# Patient Record
Sex: Female | Born: 1955
Health system: Southern US, Community
[De-identification: ages and names within clinical notes are randomized; demographics above are authoritative.]

## PROBLEM LIST (undated history)

## (undated) DIAGNOSIS — G894 Chronic pain syndrome: Secondary | ICD-10-CM

## (undated) DIAGNOSIS — F329 Major depressive disorder, single episode, unspecified: Secondary | ICD-10-CM

## (undated) DIAGNOSIS — E079 Disorder of thyroid, unspecified: Secondary | ICD-10-CM

## (undated) DIAGNOSIS — K219 Gastro-esophageal reflux disease without esophagitis: Secondary | ICD-10-CM

## (undated) DIAGNOSIS — Z8719 Personal history of other diseases of the digestive system: Secondary | ICD-10-CM

## (undated) DIAGNOSIS — J449 Chronic obstructive pulmonary disease, unspecified: Secondary | ICD-10-CM

## (undated) DIAGNOSIS — K649 Unspecified hemorrhoids: Secondary | ICD-10-CM

## (undated) DIAGNOSIS — M797 Fibromyalgia: Secondary | ICD-10-CM

## (undated) DIAGNOSIS — E0590 Thyrotoxicosis, unspecified without thyrotoxic crisis or storm: Secondary | ICD-10-CM

## (undated) DIAGNOSIS — F419 Anxiety disorder, unspecified: Secondary | ICD-10-CM

## (undated) DIAGNOSIS — L4050 Arthropathic psoriasis, unspecified: Secondary | ICD-10-CM

## (undated) DIAGNOSIS — N63 Unspecified lump in unspecified breast: Secondary | ICD-10-CM

## (undated) DIAGNOSIS — I10 Essential (primary) hypertension: Secondary | ICD-10-CM

## (undated) DIAGNOSIS — E0500 Thyrotoxicosis with diffuse goiter without thyrotoxic crisis or storm: Secondary | ICD-10-CM

## (undated) DIAGNOSIS — J45909 Unspecified asthma, uncomplicated: Secondary | ICD-10-CM

## (undated) DIAGNOSIS — M5136 Other intervertebral disc degeneration, lumbar region: Secondary | ICD-10-CM

## (undated) DIAGNOSIS — M51369 Other intervertebral disc degeneration, lumbar region without mention of lumbar back pain or lower extremity pain: Secondary | ICD-10-CM

## (undated) DIAGNOSIS — R7303 Prediabetes: Secondary | ICD-10-CM

## (undated) DIAGNOSIS — E119 Type 2 diabetes mellitus without complications: Secondary | ICD-10-CM

## (undated) DIAGNOSIS — H409 Unspecified glaucoma: Secondary | ICD-10-CM

## (undated) DIAGNOSIS — M519 Unspecified thoracic, thoracolumbar and lumbosacral intervertebral disc disorder: Secondary | ICD-10-CM

## (undated) DIAGNOSIS — R519 Headache, unspecified: Secondary | ICD-10-CM

## (undated) DIAGNOSIS — F32A Depression, unspecified: Secondary | ICD-10-CM

## (undated) DIAGNOSIS — E669 Obesity, unspecified: Secondary | ICD-10-CM

## (undated) DIAGNOSIS — M5090 Cervical disc disorder, unspecified, unspecified cervical region: Secondary | ICD-10-CM

## (undated) DIAGNOSIS — E785 Hyperlipidemia, unspecified: Secondary | ICD-10-CM

## (undated) HISTORY — DX: Disorder of thyroid, unspecified: E07.9

## (undated) HISTORY — PX: COLONOSCOPY: SHX174

## (undated) HISTORY — DX: Hyperlipidemia, unspecified: E78.5

## (undated) HISTORY — PX: HAND SURGERY: SHX662

## (undated) HISTORY — DX: Other intervertebral disc degeneration, lumbar region: M51.36

## (undated) HISTORY — PX: TOTAL HIP ARTHROPLASTY: SHX124

## (undated) HISTORY — DX: Thyrotoxicosis with diffuse goiter without thyrotoxic crisis or storm: E05.00

## (undated) HISTORY — PX: SHOULDER SURGERY: SHX246

## (undated) HISTORY — PX: BACK SURGERY: SHX140

## (undated) HISTORY — DX: Essential (primary) hypertension: I10

## (undated) HISTORY — PX: OTHER SURGICAL HISTORY: SHX169

## (undated) HISTORY — DX: Other intervertebral disc degeneration, lumbar region without mention of lumbar back pain or lower extremity pain: M51.369

## (undated) HISTORY — PX: NECK SURGERY: SHX720

## (undated) HISTORY — PX: TUBAL LIGATION: SHX77

## (undated) HISTORY — PX: JOINT REPLACEMENT: SHX530

## (undated) HISTORY — DX: Fibromyalgia: M79.7

---
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
## 1999-07-11 ENCOUNTER — Ambulatory Visit (HOSPITAL_BASED_OUTPATIENT_CLINIC_OR_DEPARTMENT_OTHER): Admission: RE | Admit: 1999-07-11 | Discharge: 1999-07-11 | Payer: Self-pay | Admitting: Orthopedic Surgery

## 1999-11-02 ENCOUNTER — Ambulatory Visit (HOSPITAL_COMMUNITY): Admission: RE | Admit: 1999-11-02 | Discharge: 1999-11-02 | Payer: Self-pay | Admitting: Orthopedic Surgery

## 1999-11-02 ENCOUNTER — Encounter: Payer: Self-pay | Admitting: Orthopedic Surgery

## 1999-12-05 ENCOUNTER — Ambulatory Visit (HOSPITAL_BASED_OUTPATIENT_CLINIC_OR_DEPARTMENT_OTHER): Admission: RE | Admit: 1999-12-05 | Discharge: 1999-12-05 | Payer: Self-pay | Admitting: Orthopedic Surgery

## 2000-02-15 ENCOUNTER — Encounter (INDEPENDENT_AMBULATORY_CARE_PROVIDER_SITE_OTHER): Payer: Self-pay | Admitting: *Deleted

## 2000-02-15 ENCOUNTER — Ambulatory Visit (HOSPITAL_BASED_OUTPATIENT_CLINIC_OR_DEPARTMENT_OTHER): Admission: RE | Admit: 2000-02-15 | Discharge: 2000-02-15 | Payer: Self-pay | Admitting: Orthopedic Surgery

## 2000-05-12 ENCOUNTER — Encounter: Admission: RE | Admit: 2000-05-12 | Discharge: 2000-08-10 | Payer: Self-pay | Admitting: Anesthesiology

## 2000-08-21 ENCOUNTER — Encounter: Admission: RE | Admit: 2000-08-21 | Discharge: 2000-11-19 | Payer: Self-pay | Admitting: Anesthesiology

## 2001-06-19 ENCOUNTER — Other Ambulatory Visit: Admission: RE | Admit: 2001-06-19 | Discharge: 2001-06-19 | Payer: Self-pay | Admitting: Family Medicine

## 2004-05-01 ENCOUNTER — Other Ambulatory Visit: Payer: Self-pay

## 2004-08-26 HISTORY — PX: CARDIAC CATHETERIZATION: SHX172

## 2004-10-04 ENCOUNTER — Ambulatory Visit: Payer: Self-pay | Admitting: Anesthesiology

## 2004-10-12 ENCOUNTER — Ambulatory Visit: Payer: Self-pay | Admitting: Family Medicine

## 2004-11-01 ENCOUNTER — Ambulatory Visit: Payer: Self-pay | Admitting: Anesthesiology

## 2004-11-14 ENCOUNTER — Ambulatory Visit: Payer: Self-pay | Admitting: Unknown Physician Specialty

## 2004-12-13 ENCOUNTER — Ambulatory Visit: Payer: Self-pay | Admitting: Anesthesiology

## 2005-01-30 ENCOUNTER — Ambulatory Visit: Payer: Self-pay | Admitting: Anesthesiology

## 2005-05-21 ENCOUNTER — Ambulatory Visit: Payer: Self-pay | Admitting: Gastroenterology

## 2005-05-24 ENCOUNTER — Ambulatory Visit: Payer: Self-pay | Admitting: Neurology

## 2005-07-01 ENCOUNTER — Other Ambulatory Visit: Payer: Self-pay

## 2005-08-27 ENCOUNTER — Ambulatory Visit: Payer: Self-pay | Admitting: Gastroenterology

## 2005-10-10 ENCOUNTER — Ambulatory Visit: Payer: Self-pay | Admitting: Gastroenterology

## 2005-10-21 ENCOUNTER — Ambulatory Visit: Payer: Self-pay | Admitting: Unknown Physician Specialty

## 2005-11-05 ENCOUNTER — Encounter: Payer: Self-pay | Admitting: Family Medicine

## 2005-11-08 ENCOUNTER — Ambulatory Visit: Payer: Self-pay | Admitting: Family Medicine

## 2005-11-12 ENCOUNTER — Ambulatory Visit: Payer: Self-pay | Admitting: Gastroenterology

## 2005-11-21 ENCOUNTER — Emergency Department: Payer: Self-pay | Admitting: Emergency Medicine

## 2005-11-24 ENCOUNTER — Encounter: Payer: Self-pay | Admitting: Family Medicine

## 2005-12-19 ENCOUNTER — Emergency Department: Payer: Self-pay | Admitting: Emergency Medicine

## 2006-01-14 ENCOUNTER — Emergency Department: Payer: Self-pay | Admitting: Internal Medicine

## 2006-11-05 ENCOUNTER — Ambulatory Visit: Payer: Self-pay | Admitting: Anesthesiology

## 2006-11-27 ENCOUNTER — Ambulatory Visit: Payer: Self-pay | Admitting: Anesthesiology

## 2006-12-24 ENCOUNTER — Ambulatory Visit: Payer: Self-pay | Admitting: Gastroenterology

## 2007-01-22 ENCOUNTER — Ambulatory Visit: Payer: Self-pay | Admitting: Anesthesiology

## 2007-02-20 ENCOUNTER — Ambulatory Visit: Payer: Self-pay

## 2007-03-10 ENCOUNTER — Ambulatory Visit: Payer: Self-pay | Admitting: Anesthesiology

## 2007-03-26 ENCOUNTER — Ambulatory Visit: Payer: Self-pay | Admitting: Family Medicine

## 2007-04-16 ENCOUNTER — Ambulatory Visit: Payer: Self-pay | Admitting: General Practice

## 2007-06-03 ENCOUNTER — Ambulatory Visit: Payer: Self-pay | Admitting: Anesthesiology

## 2007-06-17 ENCOUNTER — Ambulatory Visit: Payer: Self-pay | Admitting: Pain Medicine

## 2007-07-29 ENCOUNTER — Ambulatory Visit: Payer: Self-pay | Admitting: Pain Medicine

## 2007-08-12 ENCOUNTER — Ambulatory Visit: Payer: Self-pay | Admitting: Pain Medicine

## 2007-09-01 ENCOUNTER — Ambulatory Visit: Payer: Self-pay | Admitting: Neurology

## 2007-09-01 ENCOUNTER — Ambulatory Visit: Payer: Self-pay | Admitting: Pain Medicine

## 2007-09-28 ENCOUNTER — Ambulatory Visit: Payer: Self-pay | Admitting: Pain Medicine

## 2007-10-15 ENCOUNTER — Ambulatory Visit: Payer: Self-pay | Admitting: Pain Medicine

## 2007-11-23 ENCOUNTER — Ambulatory Visit: Payer: Self-pay | Admitting: Physician Assistant

## 2007-12-22 ENCOUNTER — Ambulatory Visit: Payer: Self-pay | Admitting: Physician Assistant

## 2008-04-02 ENCOUNTER — Encounter: Admission: RE | Admit: 2008-04-02 | Discharge: 2008-04-02 | Payer: Self-pay | Admitting: Neurosurgery

## 2008-05-05 ENCOUNTER — Ambulatory Visit: Payer: Self-pay | Admitting: Family Medicine

## 2008-06-22 ENCOUNTER — Ambulatory Visit: Payer: Self-pay | Admitting: Pain Medicine

## 2008-06-29 ENCOUNTER — Ambulatory Visit: Payer: Self-pay | Admitting: Pain Medicine

## 2008-07-06 ENCOUNTER — Ambulatory Visit: Payer: Self-pay | Admitting: Pain Medicine

## 2008-12-02 ENCOUNTER — Ambulatory Visit (HOSPITAL_COMMUNITY): Admission: RE | Admit: 2008-12-02 | Discharge: 2008-12-02 | Payer: Self-pay | Admitting: Neurosurgery

## 2008-12-07 ENCOUNTER — Encounter: Payer: Self-pay | Admitting: Unknown Physician Specialty

## 2008-12-22 ENCOUNTER — Ambulatory Visit: Payer: Self-pay | Admitting: Gastroenterology

## 2008-12-24 ENCOUNTER — Encounter: Payer: Self-pay | Admitting: Unknown Physician Specialty

## 2009-01-24 ENCOUNTER — Encounter: Payer: Self-pay | Admitting: Unknown Physician Specialty

## 2009-06-30 ENCOUNTER — Ambulatory Visit: Payer: Self-pay | Admitting: Family Medicine

## 2009-09-07 ENCOUNTER — Ambulatory Visit: Payer: Self-pay

## 2009-10-11 ENCOUNTER — Inpatient Hospital Stay (HOSPITAL_COMMUNITY): Admission: RE | Admit: 2009-10-11 | Discharge: 2009-10-12 | Payer: Self-pay | Admitting: Neurosurgery

## 2010-03-13 ENCOUNTER — Ambulatory Visit: Payer: Self-pay | Admitting: Specialist

## 2010-03-21 ENCOUNTER — Ambulatory Visit: Payer: Self-pay | Admitting: Specialist

## 2010-05-17 ENCOUNTER — Ambulatory Visit: Payer: Self-pay | Admitting: Pain Medicine

## 2010-05-30 ENCOUNTER — Other Ambulatory Visit: Payer: Self-pay | Admitting: Specialist

## 2010-06-04 ENCOUNTER — Ambulatory Visit: Payer: Self-pay | Admitting: Pain Medicine

## 2010-06-07 ENCOUNTER — Ambulatory Visit: Payer: Self-pay | Admitting: Pain Medicine

## 2010-06-28 ENCOUNTER — Ambulatory Visit: Payer: Self-pay | Admitting: Pain Medicine

## 2010-08-08 ENCOUNTER — Ambulatory Visit: Payer: Self-pay | Admitting: Pain Medicine

## 2010-09-05 ENCOUNTER — Ambulatory Visit: Payer: Self-pay | Admitting: Pain Medicine

## 2010-09-16 ENCOUNTER — Encounter: Payer: Self-pay | Admitting: Neurosurgery

## 2010-09-27 ENCOUNTER — Ambulatory Visit: Payer: Self-pay | Admitting: Unknown Physician Specialty

## 2010-10-18 ENCOUNTER — Ambulatory Visit: Payer: Self-pay | Admitting: Unknown Physician Specialty

## 2010-10-30 ENCOUNTER — Inpatient Hospital Stay: Payer: Self-pay | Admitting: Unknown Physician Specialty

## 2010-11-14 LAB — BASIC METABOLIC PANEL
BUN: 8 mg/dL (ref 6–23)
CO2: 29 mEq/L (ref 19–32)
Calcium: 9.9 mg/dL (ref 8.4–10.5)
Chloride: 105 mEq/L (ref 96–112)
Creatinine, Ser: 0.73 mg/dL (ref 0.4–1.2)
GFR calc Af Amer: >60 mL/min (ref >60)
GFR calc non Af Amer: >60 mL/min (ref >60)
Glucose, Bld: 77 mg/dL (ref 70–99)
Potassium: 3.7 mEq/L (ref 3.5–5.1)
Sodium: 138 mEq/L (ref 135–145)

## 2010-11-14 LAB — CBC
HCT: 39.7 % (ref 36.0–46.0)
Hemoglobin: 13.5 g/dL (ref 12.0–15.0)
MCHC: 34 g/dL (ref 30.0–36.0)
MCV: 84.8 fL (ref 78.0–100.0)
Platelets: 193 10*3/uL (ref 150–400)
RBC: 4.68 MIL/uL (ref 3.87–5.11)
RDW: 13.5 % (ref 11.5–15.5)
WBC: 4.4 10*3/uL (ref 4.0–10.5)

## 2011-01-11 NOTE — Op Note (Signed)
[facility]  Patient:    Christine Higgins, Christine Higgins                         MRN: [id number] Proc. Date: 09/09/00 Adm. Date:  [id number] Attending:  Thyra Breed CC:         Harvie Junior, M.D.                           Operative Report  PROCEDURE:  Bretylium/lidocaine Bier block of the right upper extremity.  DIAGNOSIS:  Complex regional pain syndrome of the right upper extremity.  ANESTHESIOLOGIST:  Thyra Breed, M.D.  INTERVAL HISTORY:  The patient was initially seen by [place] back in September 2001 at which time she underwent a right stellate ganglion block and noticed only minimal improvement which lasted about one hour.  She is back for a trial of a right upper extremity Bier block today.  She complains of decreased strength and pain in her right upper extremity.  She is unable to use her right upper extremity.  She states that the pain is fairly tense and rated it as a level of 8/10.  PHYSICAL EXAMINATION:  Blood pressure 131/76, heart rate 87, respirations 18, O2 saturation is 99%.  She exhibits no allodynia, no appreciable temperature differences and no increased hair growth.  There is no evidence of a stroke. She does have pain on resisted movements of her right upper extremity.  Pulses were symmetric.  Sensation is intact to pinprick and vibratory sense.  DESCRIPTION OF PROCEDURE:  After informed consent was obtained, the patient was placed in the semirecumbent position and monitored.  An IV was established in her left upper extremity and a second IV was established in her right upper extremity.  A cuff was applied to the right upper extremity to check for patency which was intact.  Her right upper extremity was elevated and wrapped with an Esmarch bandage.  The proximal cuff was inflated and the bandage removed.  Her right upper extremity was infused with 45 ml of 0.5% lidocaine with 300 mg of bretylium.  The patient was sedated lightly with Versed  and fentanyl.  Eight minutes after inflating the proximal cuff, the distal cuff was inflated and the proximal cuff deflated.  The total cuff time was at 40 minutes and at the end of 40 minutes, the cuff was released.  The patient noted decreased pain with numbness of her hand.  Postprocedure condition:  Stable.  DISCHARGE INSTRUCTIONS: 1. Resume previous diet. 2. Limitation of activities per instruction sheet. 3. Continue current medications. 4. The patient plans to follow up with Dr. Luiz Blare. DD:  09/09/00 TD:  09/09/00 Job: [id number]

## 2011-01-11 NOTE — Op Note (Signed)
[place]. [facility]  Patient:    Christine Higgins, GUIFFRE                         MRN: [id number] Proc. Date: 02/15/00 Adm. Date:  [id number] Disc. Date: [id number] Attending:  Milly Jakob CC:         Harvie Junior, M.D.                           Operative Report  PREOPERATIVE DIAGNOSIS:  Carpal tunnel syndrome with flexor tenosynovitis.  POSTOPERATIVE DIAGNOSIS:  Carpal tunnel syndrome with flexor tenosynovitis.  OPERATION: 1. Carpal tunnel release. 2. Open flexor tenosynovectomy.  SURGEON:  Harvie Junior, M.D.  ASSISTANT:  Dr. Willa Rough.  ANESTHESIA:  Forearm based IV regional converted to a LMA general.  BRIEF HISTORY:  This is a 55 year old female with a longstanding history of having bilateral carpal tunnel syndrome.  We ultimately did a carpal tunnel release on her and she did great for a month.  She then started having worsening symptoms.  MRI and EMG showed that she had worsening median nerve function, and it was felt by MRI to be related to a flexor tendon synovitis. We did a second exposure and did a flexor tenosynovectomy and she did wonderfully.  She had a tremendous amount of flexor tenosynovitis.  She was brought to the operating room for a left sided carpal tunnel release. At this point it was felt that it was important to do a flexor tenosynovectomy given her problems with the opposite side and this was our plan preoperatively.  PROCEDURE:  The patient was brought to the operating room and after adequate general anesthesia was obtained with the forearm based IV regional the patient was placed supine on the operating room table.  A curvilinear incision was made with the angled portion to cross the wrist crease and once this was done the exposure was made and the median nerve was identified.  The whole carpal tunnel was released in a standard fashion and the attention was then turned towards the flexor tendon.  There was a tremendous amount  of tenosynovium was identified in the carpal tunnel, and the median nerve was carefully identified and retracted out of the way, and a flexor tenosynovectomy was performed on each tendon in the carpal canal.  Once this was finished the wound was irrigated and suctioned dry.  A check was made to identify the motor branch of the median nerve to make sure it was not compressed, and then one final check was made of each of the flexor tendons individually and a excellent tenosynovectomy had been performed.  Following this the wound was copiously irrigated and suctioned dry.  The skin was then closed with 4-0 nylon interrupted horizontal mattress sutures.  A sterile and compressive dressing was applied as well as with volar plaster and the patient was taken to the recovery room and she was noted to be satisfactory condition.  ESTIMATED BLOOD LOSS:  None. DD:  02/15/00 TD:  02/18/00 Job: [id number]

## 2011-01-11 NOTE — Op Note (Signed)
[place]  Patient:    Christine Higgins, Christine Higgins                         MRN: [id number] Proc. Date: 12/05/99 Adm. Date:  [id number] Attending:  Milly Jakob CC:         Harvie Junior, M.D.                           Operative Report  PREOPERATIVE DIAGNOSIS:  Medial nerve compression syndrome, status post carpal tunnel release with extensive flexor synovitis.  POSTOPERATIVE DIAGNOSIS:  Medial nerve compression syndrome, status post carpal  tunnel release with extensive flexor synovitis.  PRINCIPAL PROCEDURES: 1. Carpal tunnel release with extensive decompression of median nerve proximally in    the forearm. 2. Volar compartment fasciotomy. 3. Debridement of flexor tenosynovitis.  SURGEON:  Harvie Junior, M.D. and Artist Pais. Mina Marble, M.D.  ASSISTANTWilla Rough, P.A.  ANESTHESIA:  General.  BRIEF HISTORY:  She is a 55 year old female with a long history of having had carpal tunnel syndrome bilaterally.  She was initially evaluated and treated conservatively and failed.  She was ultimately taken to the operating room for carpal tunnel release.  A standard carpal tunnel release was performed and she initially did well for about 2-3 weeks and then she started getting worse symptoms. This progressed to a height at about 3 months, where her symptoms were similar, if not worse to prior to surgery.  At this point, an EMG was obtained, which showed that she had significantly worse parameters than prior to surgery, although it as done by a different electromyographer is a little bit unclear.  Given the severity of the new numbers, a second EMG was performed four weeks later and this had not changed, showing that there was a static compression on the nerve.  An MRI was obtained to make sure the nerve was okay and it in fact appeared to be.  The time of MRI was noted to be extensive flexor tenosynovitis and after a long discussion with the patient, we  ultimately elected to go back and evaluate the median nerve as well as, do a flexor synovectomy.  She was brought to the operating room for this procedure.  PROCEDURE:  Patient was taken to the operating room and after adequate anesthesia was obtained with general anesthetic, the patient placed supine on the operating table.  The right arm was then prepped and draped in usual sterile fashion. Following Esmarch exsanguination of the extremity, blood pressure tourniquet was inflated 250 mmHg.  Following this, the carpal tunnel incision was opened, as well as, an ulnar rounded extension and then a radial extension proximally to allow access to the volar forearm.  Following this, subcutaneous tissues were taken down to the level of the palmaris tendon, which was identified and the fascia was opened.  The median nerve was identified just radial to the palmaris tendon and  this was traced distally.  The nerve was then retracted with vessel loops and attention was then turned to the flexor tendons within the carpal tunnel. Extensive flexor tenosynovectomy was performed and there was extensive amounts f flexor tenosynovitis.  Each tendon was taken singly and stripped of its tenosynovium, which was extensive and looked diseased.  The material was sent to the laboratory for evaluation.  Following additional synovectomy, a final check was made to make sure there was no additional tenosynovium and  the tenosynovium had  been cleared from the floor of the canal, as well as, off of each of the tendons individually and this was done distally and to the area of the palm.  Following  this, the median nerve was identified again and traced proximally to make sure there was no compression on it.  A volar compartment fasciotomy was performed to ensure that there was no compression on the median nerve.  The wound at this point was copiously irrigated, tourniquet was let down.  No significant  bleeding was encountered and those bleeders that were, were controlled with electrocautery.  Following this, the wound was one final time copiously irrigated and suctioned ry. The skin was then closed with a combination of 4-0 nylon interrupted sutures. wo Penrose drains were placed, one proximally and one distally to allow for drainage of any blood from the flexor tenosynovectomy.  At this point, a sterile compressive dressing was applied, as well as, a volar plaster and the patient was taken to recovery room, where she was noted to be in satisfactory condition.  Estimated blood loss for the procedure was less than 50 cc. DD:  12/05/99 TD:  12/05/99 Job: [id number]

## 2011-01-11 NOTE — Op Note (Signed)
[place]. [facility]  Patient:    Christine Higgins                          MRN: [id number] Proc. Date: 07/11/99 Adm. Date:  [id number] Attending:  Milly Jakob CC:         Harvie Junior, M.D.                           Operative Report  PREOPERATIVE DIAGNOSIS:  Carpal tunnel syndrome, bilateral.  POSTOPERATIVE DIAGNOSIS:  Carpal tunnel syndrome, bilateral.  PRINCIPAL PROCEDURE:  Right carpal tunnel release.  SURGEON:  Harvie Junior, M.D.  ASSISTANT:  Kerby Less, P.A.  ANESTHESIA:  Forearm-based IV regional.  BRIEF HISTORY:  This is a 55 year old female with a long history of having severe bilateral carpal tunnel syndrome.  Because of this, she had failed conservative  care and she is ultimately taken to the operating room for carpal tunnel release.  DESCRIPTION OF PROCEDURE:  Patient was taken to the operating room and after adequate anesthesia was obtained with ______ , the patient was placed supine on  the operating table.  The right arm was then prepped and draped in the usual sterile fashion and following this, anesthesia had already been induced with a forearm-based IV region and then she was prepped, as stated above, and at this point, a curvilinear incision was made just ulnar to the median/midline wrist crease.  Subcutaneous tissue was taken down to the level of the palmar fascia and flaps were raised medial and lateral.  The volar carpal ligament was identified and sharply incised, care being taken to just poke through the ligament.  At that point, a Therapist, nutritional was used to go underneath the ligament both proximally nd distally.  Once that had been elevated both proximally and distally, the scissors were used to divide the volar carpal ligament both proximally and distally and t that point, a finger could be placed in the wound both proximally and distally,  ensuring that the nerve was in fact freed up.  Care was taken to  look at the nerve at this point.  It was severely narrowed and compressed right at the area of the volar carpal ligament.  There was some significant epineurial thickening.  This was moderately dissected out, although care was taken not to really injure the nerve at all, and the nerve was just freed up both medially and laterally.  The wound was copiously irrigated and suctioned dry.  The skin was closed with a combination f interrupted and running 4-0 nylon sutures.  A sterile compressive dressing was applied and a volar plaster and the patient was taken to the recovery room where she was noted to be in satisfactory condition.  Estimated blood loss for the procedure was none. DD:  07/11/99 TD:  07/12/99 Job: [id number]

## 2011-01-11 NOTE — Procedures (Signed)
[facility]  Patient:    Christine Higgins, Christine Higgins                         MRN: [id number] Proc. Date: 05/12/00 Attending:  Thyra Breed, M.D. CC:         Harvie Junior, M.D.   Procedure Report  DATE OF BIRTH:  02/20/1956  PROCEDURE:  Right stellate ganglion block.  DIAGNOSIS:  Bilateral hand pain.  HISTORY OF PRESENT ILLNESS:  Christine Higgins is a very pleasant 55 year old who is sent to [place] by Dr. Jodi Geralds for a diagnostic stellate ganglion block. It was originally scheduled for bilateral but she only received a right stellate ganglion block.  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 By May of 2000, this had become so severe that her right hand was going numb and at one point, she was unable to use it without a lot of discomfort. She saw her company nurse who placed her in wrist splints in May of 2000. By August of 2000, her right upper extremity went totally numb and was weak. She was having symptoms in her left but only mild compared to the severe symptoms and right upper extremity which she characterized as a burning type of discomfort with dysesthesia. She was seen in the emergency room at [facility] and diagnosed with carpal tunnel syndrome and seen by Dr. Hyacinth Meeker who injected her shoulder with corticosteroids which apparently she had a reaction to where she developed flu-like symptoms. As she used her right upper extremity less, she became more symptomatic in the left upper extremity. She was sent to see Dr. Luiz Blare for evaluation and a diagnosis of carpal tunnel syndrome was made. In November of 2000, she underwent surgery for her right carpal tunnel syndrome and did well for about 3 weeks and then developed an acute discomfort distinct from her previous discomfort. She underwent  an MRI which showed a lot of scar formation and underwent revision of the scar in April of 2001. She did well for a few weeks. As she required more and more of her left upper extremity, she developed increasing symptoms there and by May/June 2001, it had progressed to the point where by June of 2001 she required a left carpal tunnel release. For 4-5 weeks, she did well. She was followed up with physical therapy but developed increasing discomfort.  She has been treated with multiple medical interventions including nortriptyline, neurontin, vioxx and Darvocet with minimal improvement. She is currently on celebrex which is not reducing any of her discomfort.  She describes her pain as an aching, burning, throbbing type of pain identical to the pain at the outset but more intense. It is increased by activity and improved by the application of ice. She has been evaluated with nerve conduction studies on 2 occasions, the last one being about 3 months ago but has had None done recently. She notes that she has developed headaches associated with the right upper shoulder discomfort. At night, she has a lot of burning dysesthesia in her shoulders.  CURRENT MEDICATIONS:  Celebrex.  ALLERGIES:  Cortisone.  FAMILY HISTORY:  Positive for hypertension, prostate problems, congestive heart failure and lung cancer.  SOCIAL HISTORY:  The patient is a nonsmoker, nondrinker. She was working as an Physicist, medical person.  PAST  SURGICAL HISTORY:  Significant for cesarean section in 1991 and wrist surgeries as mentioned in HPI.  ACTIVE MEDICAL PROBLEMS:  Seasonal allergies and atopic dermatitis.  REVIEW OF SYSTEMS:  GENERAL:  Significant for sweating associated with exacerbations in her pain.  HEAD:  Significant for headaches as mentioned above. EYES:  Negative.  NOSE: Negative.  EARS:  Negative.  PULMONARY: Negative. CARDIOVASCULAR:  Negative. GI:  Negative. GU:   Negative. MUSCULOSKELETAL/NEUROLOGIC:  See HPI. HEMATOLOGIC:  Negative. SKIN: Significant for atopic dermatitis.  ENDOCRINE:  Negative. PSYCHIATRIC: Significant for depression as a result of her arm discomfort. ALLERGY: Significant for seasonal allergies.  PHYSICAL EXAMINATION:  VITAL SIGNS:  Blood pressure 120/70, heart rate 90, respiratory rate 16, O2 saturations 99%, temperature 97.7 and pain level is 8/10.  GENERAL:  This is a very pleasant female in no acute distress.  HEENT:  Head was normocephalic, atraumatic. Eyes, extraocular movements intact with conjunctivae and sclerae clear. Nose patent nares without discharge. Oropharynx was free of lesions.  NECK:  Supple without lymphadenopathy. Carotids were 2+ and symmetric without bruits. She had a very thick muscular neck.  LUNGS:  Clear.  HEART:  Regular rate and rhythm.  BREASTS/ABDOMINAL/PELVIC/RECTAL:  Not performed  EXTREMITIES:  The patient demonstrated well healed surgical scars over her wrists with negative Phalen and Tinel sign. She noted shoulder discomfort when she was doing the Phalens test in the effort to hold her shoulders up. Radial pulses and dorsalis pedis pulse were 2+ and symmetric.  NEUROLOGIC:  The patient was oriented x 4. Cranial nerves II-XII are grossly intact. Deep tendon reflexes were symmetric in the upper and lower extremity with downgoing toes. Motor was significant for decreased hand grips in the hands but this may have been decreased effort due to the discomfort in her hands. Sensory, the patient demonstrated intact pinprick and vibratory sense with no evidence of hyperesthesia or allodynia. Coordination was grossly intact.  IMPRESSION: 1. Bilateral hand pain status post carpal tunnel release with possible    element of sympathetic mediated pain but suspected ongoing discomfort    from her median nerve entrapment which is just not resolved completely. 2. Seasonal allergies. 3. Atopic  dermatitis. 4. Depression.   DISPOSITION:  I discussed with the patient the possibility that she may have a positive response to a stellate ganglion block and that as a diagnostic test, it might help to delineate some of her discomfort better. I advised her that this may not be her ultimate treatment mode and that she may have to have further interventions either with IV infusion of the lidocaine or repeat blocks versus a Bier block. I discussed in detail a stellate ganglion block and the potential risks and complications as well as the benefits. She wishes to proceed.  DESCRIPTION OF PROCEDURE:  After informed consent was obtained, the patient was placed in the supine position and monitored. An IV was established in her left upper extremity. A towel roll was placed between her shoulder blades. I identified the C6 tubercle on the right side and marked the skin overlying this. The skin was prepped with Betadine x 3. A skin wheal was raised with a 27 gauge needle using 1% lidocaine. A 22 gauge needle was introduced down to [place] tubercle with negative aspiration. A test dose with a 0.5 cc of 1% lidocaine was negative for intravenous and spinal injection. I injected 9 ml of 1% lidocaine incrementally with negative aspirates between each 1 ml increment with the assistance of my nurse. The  needle was removed intact.  Fifteen minutes later, the patient noted at least a 50% reduction in her discomfort. She developed a prominent right Horner syndrome and vasodilatation of the right hand. This suggested she has a partially sympathetic mediated pain in her right upper extremity and this does not exclude other causes.  DISPOSITION: 1. I advised the patient that we could bring her back and repeat the test    to see whether we could get a more lasting response, but I wish to see    how she does over the next 2-3 days. I will be more than happy to see her    again at Dr. Luiz Blare request. 2.  In the meantime, it might be helpful to repeat her nerve conduction studies    to see whether there has been any overall improvement. 3. I will see the patient back in follow-up at Dr. Luiz Blare request. Other    options may include IV infusion of lidocaine with the thought of placing    her on mexilitene versus a bertillim Bier block.  DD:  05/12/00 TD:  05/13/00 Job: [id number]

## 2011-04-12 ENCOUNTER — Ambulatory Visit: Payer: Self-pay | Admitting: Family Medicine

## 2011-11-04 ENCOUNTER — Ambulatory Visit: Payer: Self-pay | Admitting: Orthopedic Surgery

## 2012-08-25 ENCOUNTER — Ambulatory Visit: Payer: Self-pay | Admitting: Physical Medicine and Rehabilitation

## 2012-11-18 ENCOUNTER — Ambulatory Visit: Payer: Self-pay | Admitting: Internal Medicine

## 2013-07-13 ENCOUNTER — Ambulatory Visit: Payer: Self-pay | Admitting: Gastroenterology

## 2013-09-17 ENCOUNTER — Ambulatory Visit: Payer: Self-pay | Admitting: Gastroenterology

## 2013-09-20 LAB — PATHOLOGY REPORT

## 2013-12-06 ENCOUNTER — Ambulatory Visit: Payer: Self-pay | Admitting: Gastroenterology

## 2014-02-03 DIAGNOSIS — G43909 Migraine, unspecified, not intractable, without status migrainosus: Secondary | ICD-10-CM | POA: Insufficient documentation

## 2014-02-03 DIAGNOSIS — K219 Gastro-esophageal reflux disease without esophagitis: Secondary | ICD-10-CM | POA: Insufficient documentation

## 2014-02-03 DIAGNOSIS — M810 Age-related osteoporosis without current pathological fracture: Secondary | ICD-10-CM | POA: Insufficient documentation

## 2014-02-03 DIAGNOSIS — E785 Hyperlipidemia, unspecified: Secondary | ICD-10-CM | POA: Insufficient documentation

## 2014-03-14 DIAGNOSIS — K649 Unspecified hemorrhoids: Secondary | ICD-10-CM | POA: Insufficient documentation

## 2014-04-19 DIAGNOSIS — E119 Type 2 diabetes mellitus without complications: Secondary | ICD-10-CM | POA: Insufficient documentation

## 2014-07-29 DIAGNOSIS — I700 Atherosclerosis of aorta: Secondary | ICD-10-CM | POA: Insufficient documentation

## 2014-08-01 ENCOUNTER — Ambulatory Visit: Payer: Self-pay

## 2014-08-09 DIAGNOSIS — Z6835 Body mass index (BMI) 35.0-35.9, adult: Secondary | ICD-10-CM

## 2014-08-10 ENCOUNTER — Ambulatory Visit: Payer: Self-pay | Admitting: General Practice

## 2014-08-10 LAB — URINALYSIS, COMPLETE
Bacteria: NONE SEEN
Bilirubin,UR: NEGATIVE
Blood: NEGATIVE
Glucose,UR: NEGATIVE mg/dL (ref 0–75)
Ketone: NEGATIVE
Leukocyte Esterase: NEGATIVE
Nitrite: NEGATIVE
Ph: 6 (ref 4.5–8.0)
Protein: NEGATIVE
RBC,UR: NONE SEEN /HPF (ref 0–5)
Specific Gravity: 1.004 (ref 1.003–1.030)
Squamous Epithelial: <1
WBC UR: <1 /HPF (ref 0–5)

## 2014-08-10 LAB — APTT: Activated PTT: 23.8 secs (ref 23.6–35.9)

## 2014-08-10 LAB — MRSA PCR SCREENING

## 2014-08-10 LAB — SEDIMENTATION RATE: Erythrocyte Sed Rate: 13 mm/hr (ref 0–30)

## 2014-08-10 LAB — BASIC METABOLIC PANEL
Anion Gap: 8 (ref 7–16)
BUN: 8 mg/dL (ref 7–18)
Calcium, Total: 9.6 mg/dL (ref 8.5–10.1)
Chloride: 98 mmol/L (ref 98–107)
Co2: 30 mmol/L (ref 21–32)
Creatinine: 0.75 mg/dL (ref 0.60–1.30)
EGFR (African American): >60
EGFR (Non-African Amer.): >60
Glucose: 109 mg/dL — ABNORMAL HIGH (ref 65–99)
Osmolality: 271 (ref 275–301)
Potassium: 3.4 mmol/L — ABNORMAL LOW (ref 3.5–5.1)
Sodium: 136 mmol/L (ref 136–145)

## 2014-08-10 LAB — CBC
HCT: 41.3 % (ref 35.0–47.0)
HGB: 13.2 g/dL (ref 12.0–16.0)
MCH: 26.9 pg (ref 26.0–34.0)
MCHC: 32.1 g/dL (ref 32.0–36.0)
MCV: 84 fL (ref 80–100)
Platelet: 245 10*3/uL (ref 150–440)
RBC: 4.92 10*6/uL (ref 3.80–5.20)
RDW: 13.8 % (ref 11.5–14.5)
WBC: 5.6 10*3/uL (ref 3.6–11.0)

## 2014-08-10 LAB — PROTIME-INR
INR: 0.9
Prothrombin Time: 11.9 secs (ref 11.5–14.7)

## 2014-08-11 LAB — URINE CULTURE

## 2014-08-24 ENCOUNTER — Inpatient Hospital Stay: Payer: Self-pay | Admitting: General Practice

## 2014-08-25 LAB — BASIC METABOLIC PANEL
Anion Gap: 5 — ABNORMAL LOW (ref 7–16)
BUN: 4 mg/dL — ABNORMAL LOW (ref 7–18)
Calcium, Total: 8.3 mg/dL — ABNORMAL LOW (ref 8.5–10.1)
Chloride: 99 mmol/L (ref 98–107)
Co2: 33 mmol/L — ABNORMAL HIGH (ref 21–32)
Creatinine: 0.81 mg/dL (ref 0.60–1.30)
EGFR (African American): >60
EGFR (Non-African Amer.): >60
Glucose: 159 mg/dL — ABNORMAL HIGH (ref 65–99)
Osmolality: 274 (ref 275–301)
Potassium: 3 mmol/L — ABNORMAL LOW (ref 3.5–5.1)
Sodium: 137 mmol/L (ref 136–145)

## 2014-08-25 LAB — HEMOGLOBIN: HGB: 11 g/dL — ABNORMAL LOW (ref 12.0–16.0)

## 2014-08-25 LAB — PLATELET COUNT: Platelet: 164 10*3/uL (ref 150–440)

## 2014-08-26 DIAGNOSIS — E876 Hypokalemia: Secondary | ICD-10-CM | POA: Diagnosis not present

## 2014-08-26 DIAGNOSIS — R000 Tachycardia, unspecified: Secondary | ICD-10-CM | POA: Diagnosis not present

## 2014-08-26 DIAGNOSIS — I10 Essential (primary) hypertension: Secondary | ICD-10-CM | POA: Diagnosis not present

## 2014-08-26 DIAGNOSIS — Z96643 Presence of artificial hip joint, bilateral: Secondary | ICD-10-CM | POA: Diagnosis not present

## 2014-08-26 DIAGNOSIS — M25561 Pain in right knee: Secondary | ICD-10-CM | POA: Diagnosis not present

## 2014-08-26 LAB — BASIC METABOLIC PANEL
Anion Gap: 6 — ABNORMAL LOW (ref 7–16)
BUN: 5 mg/dL — ABNORMAL LOW (ref 7–18)
Calcium, Total: 8.6 mg/dL (ref 8.5–10.1)
Chloride: 95 mmol/L — ABNORMAL LOW (ref 98–107)
Co2: 31 mmol/L (ref 21–32)
Creatinine: 0.74 mg/dL (ref 0.60–1.30)
EGFR (African American): >60
EGFR (Non-African Amer.): >60
Glucose: 155 mg/dL — ABNORMAL HIGH (ref 65–99)
Osmolality: 265 (ref 275–301)
Potassium: 2.8 mmol/L — ABNORMAL LOW (ref 3.5–5.1)
Sodium: 132 mmol/L — ABNORMAL LOW (ref 136–145)

## 2014-08-26 LAB — PLATELET COUNT: Platelet: 148 10*3/uL — ABNORMAL LOW (ref 150–440)

## 2014-08-26 LAB — HEMOGLOBIN: HGB: 10.2 g/dL — ABNORMAL LOW (ref 12.0–16.0)

## 2014-08-26 LAB — MAGNESIUM: Magnesium: 1.7 mg/dL — ABNORMAL LOW

## 2014-08-27 DIAGNOSIS — I10 Essential (primary) hypertension: Secondary | ICD-10-CM | POA: Diagnosis not present

## 2014-08-27 DIAGNOSIS — R000 Tachycardia, unspecified: Secondary | ICD-10-CM | POA: Diagnosis not present

## 2014-08-27 DIAGNOSIS — E875 Hyperkalemia: Secondary | ICD-10-CM | POA: Diagnosis not present

## 2014-08-27 LAB — MAGNESIUM: Magnesium: 1.9 mg/dL

## 2014-08-27 LAB — POTASSIUM: Potassium: 3.2 mmol/L — ABNORMAL LOW (ref 3.5–5.1)

## 2014-08-28 DIAGNOSIS — E875 Hyperkalemia: Secondary | ICD-10-CM | POA: Diagnosis not present

## 2014-08-28 DIAGNOSIS — I10 Essential (primary) hypertension: Secondary | ICD-10-CM | POA: Diagnosis not present

## 2014-08-28 DIAGNOSIS — R000 Tachycardia, unspecified: Secondary | ICD-10-CM | POA: Diagnosis not present

## 2014-08-28 LAB — POTASSIUM: Potassium: 3.8 mmol/L (ref 3.5–5.1)

## 2014-08-29 DIAGNOSIS — G5600 Carpal tunnel syndrome, unspecified upper limb: Secondary | ICD-10-CM | POA: Diagnosis not present

## 2014-08-29 DIAGNOSIS — M13851 Other specified arthritis, right hip: Secondary | ICD-10-CM | POA: Diagnosis not present

## 2014-08-29 DIAGNOSIS — M4800 Spinal stenosis, site unspecified: Secondary | ICD-10-CM | POA: Diagnosis not present

## 2014-08-29 DIAGNOSIS — M6080 Other myositis, unspecified site: Secondary | ICD-10-CM | POA: Diagnosis not present

## 2014-08-30 DIAGNOSIS — G894 Chronic pain syndrome: Secondary | ICD-10-CM | POA: Diagnosis not present

## 2014-08-30 DIAGNOSIS — E669 Obesity, unspecified: Secondary | ICD-10-CM | POA: Diagnosis not present

## 2014-08-30 DIAGNOSIS — Z96641 Presence of right artificial hip joint: Secondary | ICD-10-CM | POA: Diagnosis not present

## 2014-08-30 DIAGNOSIS — E785 Hyperlipidemia, unspecified: Secondary | ICD-10-CM | POA: Diagnosis not present

## 2014-08-30 DIAGNOSIS — G5600 Carpal tunnel syndrome, unspecified upper limb: Secondary | ICD-10-CM | POA: Diagnosis not present

## 2014-08-30 DIAGNOSIS — M4800 Spinal stenosis, site unspecified: Secondary | ICD-10-CM | POA: Diagnosis not present

## 2014-08-30 DIAGNOSIS — M13851 Other specified arthritis, right hip: Secondary | ICD-10-CM | POA: Diagnosis not present

## 2014-08-30 DIAGNOSIS — M6080 Other myositis, unspecified site: Secondary | ICD-10-CM | POA: Diagnosis not present

## 2014-08-30 DIAGNOSIS — I10 Essential (primary) hypertension: Secondary | ICD-10-CM | POA: Diagnosis not present

## 2014-08-30 DIAGNOSIS — M797 Fibromyalgia: Secondary | ICD-10-CM | POA: Diagnosis not present

## 2014-08-30 DIAGNOSIS — Z471 Aftercare following joint replacement surgery: Secondary | ICD-10-CM | POA: Diagnosis not present

## 2014-08-30 DIAGNOSIS — K219 Gastro-esophageal reflux disease without esophagitis: Secondary | ICD-10-CM | POA: Diagnosis not present

## 2014-08-31 DIAGNOSIS — G5600 Carpal tunnel syndrome, unspecified upper limb: Secondary | ICD-10-CM | POA: Diagnosis not present

## 2014-08-31 DIAGNOSIS — M13851 Other specified arthritis, right hip: Secondary | ICD-10-CM | POA: Diagnosis not present

## 2014-08-31 DIAGNOSIS — M4800 Spinal stenosis, site unspecified: Secondary | ICD-10-CM | POA: Diagnosis not present

## 2014-08-31 DIAGNOSIS — M6080 Other myositis, unspecified site: Secondary | ICD-10-CM | POA: Diagnosis not present

## 2014-09-01 DIAGNOSIS — E669 Obesity, unspecified: Secondary | ICD-10-CM | POA: Diagnosis not present

## 2014-09-01 DIAGNOSIS — K219 Gastro-esophageal reflux disease without esophagitis: Secondary | ICD-10-CM | POA: Diagnosis not present

## 2014-09-01 DIAGNOSIS — G894 Chronic pain syndrome: Secondary | ICD-10-CM | POA: Diagnosis not present

## 2014-09-01 DIAGNOSIS — E785 Hyperlipidemia, unspecified: Secondary | ICD-10-CM | POA: Diagnosis not present

## 2014-09-01 DIAGNOSIS — G5600 Carpal tunnel syndrome, unspecified upper limb: Secondary | ICD-10-CM | POA: Diagnosis not present

## 2014-09-01 DIAGNOSIS — M797 Fibromyalgia: Secondary | ICD-10-CM | POA: Diagnosis not present

## 2014-09-01 DIAGNOSIS — Z96641 Presence of right artificial hip joint: Secondary | ICD-10-CM | POA: Diagnosis not present

## 2014-09-01 DIAGNOSIS — M4800 Spinal stenosis, site unspecified: Secondary | ICD-10-CM | POA: Diagnosis not present

## 2014-09-01 DIAGNOSIS — M6080 Other myositis, unspecified site: Secondary | ICD-10-CM | POA: Diagnosis not present

## 2014-09-01 DIAGNOSIS — Z471 Aftercare following joint replacement surgery: Secondary | ICD-10-CM | POA: Diagnosis not present

## 2014-09-01 DIAGNOSIS — I10 Essential (primary) hypertension: Secondary | ICD-10-CM | POA: Diagnosis not present

## 2014-09-01 DIAGNOSIS — M13851 Other specified arthritis, right hip: Secondary | ICD-10-CM | POA: Diagnosis not present

## 2014-09-02 DIAGNOSIS — K219 Gastro-esophageal reflux disease without esophagitis: Secondary | ICD-10-CM | POA: Diagnosis not present

## 2014-09-02 DIAGNOSIS — I10 Essential (primary) hypertension: Secondary | ICD-10-CM | POA: Diagnosis not present

## 2014-09-02 DIAGNOSIS — Z471 Aftercare following joint replacement surgery: Secondary | ICD-10-CM | POA: Diagnosis not present

## 2014-09-02 DIAGNOSIS — E669 Obesity, unspecified: Secondary | ICD-10-CM | POA: Diagnosis not present

## 2014-09-02 DIAGNOSIS — M797 Fibromyalgia: Secondary | ICD-10-CM | POA: Diagnosis not present

## 2014-09-02 DIAGNOSIS — G894 Chronic pain syndrome: Secondary | ICD-10-CM | POA: Diagnosis not present

## 2014-09-02 DIAGNOSIS — M13851 Other specified arthritis, right hip: Secondary | ICD-10-CM | POA: Diagnosis not present

## 2014-09-02 DIAGNOSIS — G5600 Carpal tunnel syndrome, unspecified upper limb: Secondary | ICD-10-CM | POA: Diagnosis not present

## 2014-09-02 DIAGNOSIS — Z96641 Presence of right artificial hip joint: Secondary | ICD-10-CM | POA: Diagnosis not present

## 2014-09-02 DIAGNOSIS — E785 Hyperlipidemia, unspecified: Secondary | ICD-10-CM | POA: Diagnosis not present

## 2014-09-02 DIAGNOSIS — M4800 Spinal stenosis, site unspecified: Secondary | ICD-10-CM | POA: Diagnosis not present

## 2014-09-02 DIAGNOSIS — M6080 Other myositis, unspecified site: Secondary | ICD-10-CM | POA: Diagnosis not present

## 2014-09-05 DIAGNOSIS — Z96641 Presence of right artificial hip joint: Secondary | ICD-10-CM | POA: Diagnosis not present

## 2014-09-05 DIAGNOSIS — E785 Hyperlipidemia, unspecified: Secondary | ICD-10-CM | POA: Diagnosis not present

## 2014-09-05 DIAGNOSIS — G894 Chronic pain syndrome: Secondary | ICD-10-CM | POA: Diagnosis not present

## 2014-09-05 DIAGNOSIS — G5600 Carpal tunnel syndrome, unspecified upper limb: Secondary | ICD-10-CM | POA: Diagnosis not present

## 2014-09-05 DIAGNOSIS — M13851 Other specified arthritis, right hip: Secondary | ICD-10-CM | POA: Diagnosis not present

## 2014-09-05 DIAGNOSIS — E669 Obesity, unspecified: Secondary | ICD-10-CM | POA: Diagnosis not present

## 2014-09-05 DIAGNOSIS — Z471 Aftercare following joint replacement surgery: Secondary | ICD-10-CM | POA: Diagnosis not present

## 2014-09-05 DIAGNOSIS — M4800 Spinal stenosis, site unspecified: Secondary | ICD-10-CM | POA: Diagnosis not present

## 2014-09-05 DIAGNOSIS — K219 Gastro-esophageal reflux disease without esophagitis: Secondary | ICD-10-CM | POA: Diagnosis not present

## 2014-09-05 DIAGNOSIS — M6080 Other myositis, unspecified site: Secondary | ICD-10-CM | POA: Diagnosis not present

## 2014-09-05 DIAGNOSIS — I10 Essential (primary) hypertension: Secondary | ICD-10-CM | POA: Diagnosis not present

## 2014-09-05 DIAGNOSIS — M797 Fibromyalgia: Secondary | ICD-10-CM | POA: Diagnosis not present

## 2014-09-06 DIAGNOSIS — G5600 Carpal tunnel syndrome, unspecified upper limb: Secondary | ICD-10-CM | POA: Diagnosis not present

## 2014-09-06 DIAGNOSIS — M13851 Other specified arthritis, right hip: Secondary | ICD-10-CM | POA: Diagnosis not present

## 2014-09-06 DIAGNOSIS — M4800 Spinal stenosis, site unspecified: Secondary | ICD-10-CM | POA: Diagnosis not present

## 2014-09-06 DIAGNOSIS — M6080 Other myositis, unspecified site: Secondary | ICD-10-CM | POA: Diagnosis not present

## 2014-09-07 DIAGNOSIS — M13851 Other specified arthritis, right hip: Secondary | ICD-10-CM | POA: Diagnosis not present

## 2014-09-07 DIAGNOSIS — G5600 Carpal tunnel syndrome, unspecified upper limb: Secondary | ICD-10-CM | POA: Diagnosis not present

## 2014-09-07 DIAGNOSIS — M6080 Other myositis, unspecified site: Secondary | ICD-10-CM | POA: Diagnosis not present

## 2014-09-07 DIAGNOSIS — M4800 Spinal stenosis, site unspecified: Secondary | ICD-10-CM | POA: Diagnosis not present

## 2014-09-08 DIAGNOSIS — G894 Chronic pain syndrome: Secondary | ICD-10-CM | POA: Diagnosis not present

## 2014-09-08 DIAGNOSIS — Z96641 Presence of right artificial hip joint: Secondary | ICD-10-CM | POA: Diagnosis not present

## 2014-09-08 DIAGNOSIS — M4800 Spinal stenosis, site unspecified: Secondary | ICD-10-CM | POA: Diagnosis not present

## 2014-09-08 DIAGNOSIS — M6080 Other myositis, unspecified site: Secondary | ICD-10-CM | POA: Diagnosis not present

## 2014-09-08 DIAGNOSIS — K219 Gastro-esophageal reflux disease without esophagitis: Secondary | ICD-10-CM | POA: Diagnosis not present

## 2014-09-08 DIAGNOSIS — E785 Hyperlipidemia, unspecified: Secondary | ICD-10-CM | POA: Diagnosis not present

## 2014-09-08 DIAGNOSIS — M797 Fibromyalgia: Secondary | ICD-10-CM | POA: Diagnosis not present

## 2014-09-08 DIAGNOSIS — M13851 Other specified arthritis, right hip: Secondary | ICD-10-CM | POA: Diagnosis not present

## 2014-09-08 DIAGNOSIS — Z471 Aftercare following joint replacement surgery: Secondary | ICD-10-CM | POA: Diagnosis not present

## 2014-09-08 DIAGNOSIS — I10 Essential (primary) hypertension: Secondary | ICD-10-CM | POA: Diagnosis not present

## 2014-09-08 DIAGNOSIS — E669 Obesity, unspecified: Secondary | ICD-10-CM | POA: Diagnosis not present

## 2014-09-08 DIAGNOSIS — G5600 Carpal tunnel syndrome, unspecified upper limb: Secondary | ICD-10-CM | POA: Diagnosis not present

## 2014-09-09 DIAGNOSIS — M4800 Spinal stenosis, site unspecified: Secondary | ICD-10-CM | POA: Diagnosis not present

## 2014-09-09 DIAGNOSIS — K219 Gastro-esophageal reflux disease without esophagitis: Secondary | ICD-10-CM | POA: Diagnosis not present

## 2014-09-09 DIAGNOSIS — Z96641 Presence of right artificial hip joint: Secondary | ICD-10-CM | POA: Diagnosis not present

## 2014-09-09 DIAGNOSIS — G894 Chronic pain syndrome: Secondary | ICD-10-CM | POA: Diagnosis not present

## 2014-09-09 DIAGNOSIS — E785 Hyperlipidemia, unspecified: Secondary | ICD-10-CM | POA: Diagnosis not present

## 2014-09-09 DIAGNOSIS — G5600 Carpal tunnel syndrome, unspecified upper limb: Secondary | ICD-10-CM | POA: Diagnosis not present

## 2014-09-09 DIAGNOSIS — I10 Essential (primary) hypertension: Secondary | ICD-10-CM | POA: Diagnosis not present

## 2014-09-09 DIAGNOSIS — M6080 Other myositis, unspecified site: Secondary | ICD-10-CM | POA: Diagnosis not present

## 2014-09-09 DIAGNOSIS — M13851 Other specified arthritis, right hip: Secondary | ICD-10-CM | POA: Diagnosis not present

## 2014-09-09 DIAGNOSIS — E669 Obesity, unspecified: Secondary | ICD-10-CM | POA: Diagnosis not present

## 2014-09-09 DIAGNOSIS — Z471 Aftercare following joint replacement surgery: Secondary | ICD-10-CM | POA: Diagnosis not present

## 2014-09-09 DIAGNOSIS — M797 Fibromyalgia: Secondary | ICD-10-CM | POA: Diagnosis not present

## 2014-09-12 DIAGNOSIS — K219 Gastro-esophageal reflux disease without esophagitis: Secondary | ICD-10-CM | POA: Diagnosis not present

## 2014-09-12 DIAGNOSIS — Z96641 Presence of right artificial hip joint: Secondary | ICD-10-CM | POA: Diagnosis not present

## 2014-09-12 DIAGNOSIS — G894 Chronic pain syndrome: Secondary | ICD-10-CM | POA: Diagnosis not present

## 2014-09-12 DIAGNOSIS — G5600 Carpal tunnel syndrome, unspecified upper limb: Secondary | ICD-10-CM | POA: Diagnosis not present

## 2014-09-12 DIAGNOSIS — E669 Obesity, unspecified: Secondary | ICD-10-CM | POA: Diagnosis not present

## 2014-09-12 DIAGNOSIS — E785 Hyperlipidemia, unspecified: Secondary | ICD-10-CM | POA: Diagnosis not present

## 2014-09-12 DIAGNOSIS — Z471 Aftercare following joint replacement surgery: Secondary | ICD-10-CM | POA: Diagnosis not present

## 2014-09-12 DIAGNOSIS — M6080 Other myositis, unspecified site: Secondary | ICD-10-CM | POA: Diagnosis not present

## 2014-09-12 DIAGNOSIS — M797 Fibromyalgia: Secondary | ICD-10-CM | POA: Diagnosis not present

## 2014-09-12 DIAGNOSIS — M4800 Spinal stenosis, site unspecified: Secondary | ICD-10-CM | POA: Diagnosis not present

## 2014-09-12 DIAGNOSIS — M13851 Other specified arthritis, right hip: Secondary | ICD-10-CM | POA: Diagnosis not present

## 2014-09-12 DIAGNOSIS — I10 Essential (primary) hypertension: Secondary | ICD-10-CM | POA: Diagnosis not present

## 2014-09-13 DIAGNOSIS — M6080 Other myositis, unspecified site: Secondary | ICD-10-CM | POA: Diagnosis not present

## 2014-09-13 DIAGNOSIS — M4800 Spinal stenosis, site unspecified: Secondary | ICD-10-CM | POA: Diagnosis not present

## 2014-09-13 DIAGNOSIS — M13851 Other specified arthritis, right hip: Secondary | ICD-10-CM | POA: Diagnosis not present

## 2014-09-13 DIAGNOSIS — G5600 Carpal tunnel syndrome, unspecified upper limb: Secondary | ICD-10-CM | POA: Diagnosis not present

## 2014-09-14 DIAGNOSIS — M13851 Other specified arthritis, right hip: Secondary | ICD-10-CM | POA: Diagnosis not present

## 2014-09-14 DIAGNOSIS — M6080 Other myositis, unspecified site: Secondary | ICD-10-CM | POA: Diagnosis not present

## 2014-09-14 DIAGNOSIS — M4800 Spinal stenosis, site unspecified: Secondary | ICD-10-CM | POA: Diagnosis not present

## 2014-09-14 DIAGNOSIS — G5600 Carpal tunnel syndrome, unspecified upper limb: Secondary | ICD-10-CM | POA: Diagnosis not present

## 2014-09-15 DIAGNOSIS — G5600 Carpal tunnel syndrome, unspecified upper limb: Secondary | ICD-10-CM | POA: Diagnosis not present

## 2014-09-15 DIAGNOSIS — M4800 Spinal stenosis, site unspecified: Secondary | ICD-10-CM | POA: Diagnosis not present

## 2014-09-15 DIAGNOSIS — M13851 Other specified arthritis, right hip: Secondary | ICD-10-CM | POA: Diagnosis not present

## 2014-09-15 DIAGNOSIS — M6080 Other myositis, unspecified site: Secondary | ICD-10-CM | POA: Diagnosis not present

## 2014-09-19 DIAGNOSIS — M13851 Other specified arthritis, right hip: Secondary | ICD-10-CM | POA: Diagnosis not present

## 2014-09-19 DIAGNOSIS — M6080 Other myositis, unspecified site: Secondary | ICD-10-CM | POA: Diagnosis not present

## 2014-09-19 DIAGNOSIS — M4800 Spinal stenosis, site unspecified: Secondary | ICD-10-CM | POA: Diagnosis not present

## 2014-09-19 DIAGNOSIS — G5600 Carpal tunnel syndrome, unspecified upper limb: Secondary | ICD-10-CM | POA: Diagnosis not present

## 2014-09-20 DIAGNOSIS — M6080 Other myositis, unspecified site: Secondary | ICD-10-CM | POA: Diagnosis not present

## 2014-09-20 DIAGNOSIS — G5600 Carpal tunnel syndrome, unspecified upper limb: Secondary | ICD-10-CM | POA: Diagnosis not present

## 2014-09-20 DIAGNOSIS — M4800 Spinal stenosis, site unspecified: Secondary | ICD-10-CM | POA: Diagnosis not present

## 2014-09-20 DIAGNOSIS — M13851 Other specified arthritis, right hip: Secondary | ICD-10-CM | POA: Diagnosis not present

## 2014-09-20 DIAGNOSIS — G894 Chronic pain syndrome: Secondary | ICD-10-CM | POA: Diagnosis not present

## 2014-09-20 DIAGNOSIS — Z96641 Presence of right artificial hip joint: Secondary | ICD-10-CM | POA: Diagnosis not present

## 2014-09-20 DIAGNOSIS — M797 Fibromyalgia: Secondary | ICD-10-CM | POA: Diagnosis not present

## 2014-09-20 DIAGNOSIS — Z471 Aftercare following joint replacement surgery: Secondary | ICD-10-CM | POA: Diagnosis not present

## 2014-09-20 DIAGNOSIS — I10 Essential (primary) hypertension: Secondary | ICD-10-CM | POA: Diagnosis not present

## 2014-09-20 DIAGNOSIS — K219 Gastro-esophageal reflux disease without esophagitis: Secondary | ICD-10-CM | POA: Diagnosis not present

## 2014-09-20 DIAGNOSIS — E669 Obesity, unspecified: Secondary | ICD-10-CM | POA: Diagnosis not present

## 2014-09-20 DIAGNOSIS — E785 Hyperlipidemia, unspecified: Secondary | ICD-10-CM | POA: Diagnosis not present

## 2014-09-21 DIAGNOSIS — M4800 Spinal stenosis, site unspecified: Secondary | ICD-10-CM | POA: Diagnosis not present

## 2014-09-21 DIAGNOSIS — M13851 Other specified arthritis, right hip: Secondary | ICD-10-CM | POA: Diagnosis not present

## 2014-09-21 DIAGNOSIS — G5600 Carpal tunnel syndrome, unspecified upper limb: Secondary | ICD-10-CM | POA: Diagnosis not present

## 2014-09-21 DIAGNOSIS — M6080 Other myositis, unspecified site: Secondary | ICD-10-CM | POA: Diagnosis not present

## 2014-09-22 DIAGNOSIS — Z471 Aftercare following joint replacement surgery: Secondary | ICD-10-CM | POA: Diagnosis not present

## 2014-09-22 DIAGNOSIS — G5600 Carpal tunnel syndrome, unspecified upper limb: Secondary | ICD-10-CM | POA: Diagnosis not present

## 2014-09-22 DIAGNOSIS — M6080 Other myositis, unspecified site: Secondary | ICD-10-CM | POA: Diagnosis not present

## 2014-09-22 DIAGNOSIS — M13851 Other specified arthritis, right hip: Secondary | ICD-10-CM | POA: Diagnosis not present

## 2014-09-22 DIAGNOSIS — M4800 Spinal stenosis, site unspecified: Secondary | ICD-10-CM | POA: Diagnosis not present

## 2014-09-26 DIAGNOSIS — G43809 Other migraine, not intractable, without status migrainosus: Secondary | ICD-10-CM | POA: Diagnosis not present

## 2014-09-26 DIAGNOSIS — G5600 Carpal tunnel syndrome, unspecified upper limb: Secondary | ICD-10-CM | POA: Diagnosis not present

## 2014-09-26 DIAGNOSIS — M4800 Spinal stenosis, site unspecified: Secondary | ICD-10-CM | POA: Diagnosis not present

## 2014-09-26 DIAGNOSIS — E119 Type 2 diabetes mellitus without complications: Secondary | ICD-10-CM | POA: Diagnosis not present

## 2014-09-26 DIAGNOSIS — I10 Essential (primary) hypertension: Secondary | ICD-10-CM | POA: Diagnosis not present

## 2014-09-26 DIAGNOSIS — M6080 Other myositis, unspecified site: Secondary | ICD-10-CM | POA: Diagnosis not present

## 2014-09-26 DIAGNOSIS — M13851 Other specified arthritis, right hip: Secondary | ICD-10-CM | POA: Diagnosis not present

## 2014-09-27 DIAGNOSIS — M6080 Other myositis, unspecified site: Secondary | ICD-10-CM | POA: Diagnosis not present

## 2014-09-27 DIAGNOSIS — M13851 Other specified arthritis, right hip: Secondary | ICD-10-CM | POA: Diagnosis not present

## 2014-09-27 DIAGNOSIS — G5600 Carpal tunnel syndrome, unspecified upper limb: Secondary | ICD-10-CM | POA: Diagnosis not present

## 2014-09-27 DIAGNOSIS — M4800 Spinal stenosis, site unspecified: Secondary | ICD-10-CM | POA: Diagnosis not present

## 2014-09-28 DIAGNOSIS — M6080 Other myositis, unspecified site: Secondary | ICD-10-CM | POA: Diagnosis not present

## 2014-09-28 DIAGNOSIS — M4800 Spinal stenosis, site unspecified: Secondary | ICD-10-CM | POA: Diagnosis not present

## 2014-09-28 DIAGNOSIS — G5600 Carpal tunnel syndrome, unspecified upper limb: Secondary | ICD-10-CM | POA: Diagnosis not present

## 2014-09-28 DIAGNOSIS — M13851 Other specified arthritis, right hip: Secondary | ICD-10-CM | POA: Diagnosis not present

## 2014-09-29 DIAGNOSIS — M6080 Other myositis, unspecified site: Secondary | ICD-10-CM | POA: Diagnosis not present

## 2014-09-29 DIAGNOSIS — G5600 Carpal tunnel syndrome, unspecified upper limb: Secondary | ICD-10-CM | POA: Diagnosis not present

## 2014-09-29 DIAGNOSIS — M13851 Other specified arthritis, right hip: Secondary | ICD-10-CM | POA: Diagnosis not present

## 2014-09-29 DIAGNOSIS — M4800 Spinal stenosis, site unspecified: Secondary | ICD-10-CM | POA: Diagnosis not present

## 2014-10-03 DIAGNOSIS — G5600 Carpal tunnel syndrome, unspecified upper limb: Secondary | ICD-10-CM | POA: Diagnosis not present

## 2014-10-03 DIAGNOSIS — M6080 Other myositis, unspecified site: Secondary | ICD-10-CM | POA: Diagnosis not present

## 2014-10-03 DIAGNOSIS — M13851 Other specified arthritis, right hip: Secondary | ICD-10-CM | POA: Diagnosis not present

## 2014-10-03 DIAGNOSIS — M4800 Spinal stenosis, site unspecified: Secondary | ICD-10-CM | POA: Diagnosis not present

## 2014-10-04 DIAGNOSIS — M4800 Spinal stenosis, site unspecified: Secondary | ICD-10-CM | POA: Diagnosis not present

## 2014-10-04 DIAGNOSIS — M13851 Other specified arthritis, right hip: Secondary | ICD-10-CM | POA: Diagnosis not present

## 2014-10-04 DIAGNOSIS — M6080 Other myositis, unspecified site: Secondary | ICD-10-CM | POA: Diagnosis not present

## 2014-10-04 DIAGNOSIS — G5600 Carpal tunnel syndrome, unspecified upper limb: Secondary | ICD-10-CM | POA: Diagnosis not present

## 2014-10-05 DIAGNOSIS — M6080 Other myositis, unspecified site: Secondary | ICD-10-CM | POA: Diagnosis not present

## 2014-10-05 DIAGNOSIS — G5600 Carpal tunnel syndrome, unspecified upper limb: Secondary | ICD-10-CM | POA: Diagnosis not present

## 2014-10-05 DIAGNOSIS — M4800 Spinal stenosis, site unspecified: Secondary | ICD-10-CM | POA: Diagnosis not present

## 2014-10-05 DIAGNOSIS — M13851 Other specified arthritis, right hip: Secondary | ICD-10-CM | POA: Diagnosis not present

## 2014-10-06 DIAGNOSIS — M4800 Spinal stenosis, site unspecified: Secondary | ICD-10-CM | POA: Diagnosis not present

## 2014-10-06 DIAGNOSIS — Z96641 Presence of right artificial hip joint: Secondary | ICD-10-CM | POA: Diagnosis not present

## 2014-10-06 DIAGNOSIS — M13851 Other specified arthritis, right hip: Secondary | ICD-10-CM | POA: Diagnosis not present

## 2014-10-06 DIAGNOSIS — G5600 Carpal tunnel syndrome, unspecified upper limb: Secondary | ICD-10-CM | POA: Diagnosis not present

## 2014-10-06 DIAGNOSIS — M6080 Other myositis, unspecified site: Secondary | ICD-10-CM | POA: Diagnosis not present

## 2014-10-10 DIAGNOSIS — M6080 Other myositis, unspecified site: Secondary | ICD-10-CM | POA: Diagnosis not present

## 2014-10-10 DIAGNOSIS — M4800 Spinal stenosis, site unspecified: Secondary | ICD-10-CM | POA: Diagnosis not present

## 2014-10-10 DIAGNOSIS — G5600 Carpal tunnel syndrome, unspecified upper limb: Secondary | ICD-10-CM | POA: Diagnosis not present

## 2014-10-10 DIAGNOSIS — M13851 Other specified arthritis, right hip: Secondary | ICD-10-CM | POA: Diagnosis not present

## 2014-10-11 DIAGNOSIS — M13851 Other specified arthritis, right hip: Secondary | ICD-10-CM | POA: Diagnosis not present

## 2014-10-11 DIAGNOSIS — M4800 Spinal stenosis, site unspecified: Secondary | ICD-10-CM | POA: Diagnosis not present

## 2014-10-11 DIAGNOSIS — M6080 Other myositis, unspecified site: Secondary | ICD-10-CM | POA: Diagnosis not present

## 2014-10-11 DIAGNOSIS — G5600 Carpal tunnel syndrome, unspecified upper limb: Secondary | ICD-10-CM | POA: Diagnosis not present

## 2014-10-12 DIAGNOSIS — M13851 Other specified arthritis, right hip: Secondary | ICD-10-CM | POA: Diagnosis not present

## 2014-10-12 DIAGNOSIS — G5600 Carpal tunnel syndrome, unspecified upper limb: Secondary | ICD-10-CM | POA: Diagnosis not present

## 2014-10-12 DIAGNOSIS — M4800 Spinal stenosis, site unspecified: Secondary | ICD-10-CM | POA: Diagnosis not present

## 2014-10-12 DIAGNOSIS — M6080 Other myositis, unspecified site: Secondary | ICD-10-CM | POA: Diagnosis not present

## 2014-10-13 DIAGNOSIS — M6080 Other myositis, unspecified site: Secondary | ICD-10-CM | POA: Diagnosis not present

## 2014-10-13 DIAGNOSIS — M4800 Spinal stenosis, site unspecified: Secondary | ICD-10-CM | POA: Diagnosis not present

## 2014-10-13 DIAGNOSIS — M13851 Other specified arthritis, right hip: Secondary | ICD-10-CM | POA: Diagnosis not present

## 2014-10-13 DIAGNOSIS — G5600 Carpal tunnel syndrome, unspecified upper limb: Secondary | ICD-10-CM | POA: Diagnosis not present

## 2014-10-17 DIAGNOSIS — G5600 Carpal tunnel syndrome, unspecified upper limb: Secondary | ICD-10-CM | POA: Diagnosis not present

## 2014-10-17 DIAGNOSIS — M4800 Spinal stenosis, site unspecified: Secondary | ICD-10-CM | POA: Diagnosis not present

## 2014-10-17 DIAGNOSIS — M13851 Other specified arthritis, right hip: Secondary | ICD-10-CM | POA: Diagnosis not present

## 2014-10-17 DIAGNOSIS — M6080 Other myositis, unspecified site: Secondary | ICD-10-CM | POA: Diagnosis not present

## 2014-10-18 DIAGNOSIS — G5600 Carpal tunnel syndrome, unspecified upper limb: Secondary | ICD-10-CM | POA: Diagnosis not present

## 2014-10-18 DIAGNOSIS — M4800 Spinal stenosis, site unspecified: Secondary | ICD-10-CM | POA: Diagnosis not present

## 2014-10-18 DIAGNOSIS — M13851 Other specified arthritis, right hip: Secondary | ICD-10-CM | POA: Diagnosis not present

## 2014-10-18 DIAGNOSIS — M6080 Other myositis, unspecified site: Secondary | ICD-10-CM | POA: Diagnosis not present

## 2014-10-19 DIAGNOSIS — G5600 Carpal tunnel syndrome, unspecified upper limb: Secondary | ICD-10-CM | POA: Diagnosis not present

## 2014-10-19 DIAGNOSIS — M4800 Spinal stenosis, site unspecified: Secondary | ICD-10-CM | POA: Diagnosis not present

## 2014-10-19 DIAGNOSIS — M6080 Other myositis, unspecified site: Secondary | ICD-10-CM | POA: Diagnosis not present

## 2014-10-19 DIAGNOSIS — M13851 Other specified arthritis, right hip: Secondary | ICD-10-CM | POA: Diagnosis not present

## 2014-10-20 DIAGNOSIS — M4800 Spinal stenosis, site unspecified: Secondary | ICD-10-CM | POA: Diagnosis not present

## 2014-10-20 DIAGNOSIS — M13851 Other specified arthritis, right hip: Secondary | ICD-10-CM | POA: Diagnosis not present

## 2014-10-20 DIAGNOSIS — M7542 Impingement syndrome of left shoulder: Secondary | ICD-10-CM | POA: Insufficient documentation

## 2014-10-20 DIAGNOSIS — G5600 Carpal tunnel syndrome, unspecified upper limb: Secondary | ICD-10-CM | POA: Diagnosis not present

## 2014-10-20 DIAGNOSIS — M6080 Other myositis, unspecified site: Secondary | ICD-10-CM | POA: Diagnosis not present

## 2014-10-20 DIAGNOSIS — M25512 Pain in left shoulder: Secondary | ICD-10-CM | POA: Diagnosis not present

## 2014-10-24 DIAGNOSIS — M13851 Other specified arthritis, right hip: Secondary | ICD-10-CM | POA: Diagnosis not present

## 2014-10-24 DIAGNOSIS — G5600 Carpal tunnel syndrome, unspecified upper limb: Secondary | ICD-10-CM | POA: Diagnosis not present

## 2014-10-24 DIAGNOSIS — M6080 Other myositis, unspecified site: Secondary | ICD-10-CM | POA: Diagnosis not present

## 2014-10-24 DIAGNOSIS — M4800 Spinal stenosis, site unspecified: Secondary | ICD-10-CM | POA: Diagnosis not present

## 2014-10-25 ENCOUNTER — Emergency Department: Payer: Self-pay | Admitting: Emergency Medicine

## 2014-10-25 DIAGNOSIS — G5600 Carpal tunnel syndrome, unspecified upper limb: Secondary | ICD-10-CM | POA: Diagnosis not present

## 2014-10-25 DIAGNOSIS — M6080 Other myositis, unspecified site: Secondary | ICD-10-CM | POA: Diagnosis not present

## 2014-10-25 DIAGNOSIS — I10 Essential (primary) hypertension: Secondary | ICD-10-CM | POA: Diagnosis not present

## 2014-10-25 DIAGNOSIS — L03116 Cellulitis of left lower limb: Secondary | ICD-10-CM | POA: Diagnosis not present

## 2014-10-25 DIAGNOSIS — M4800 Spinal stenosis, site unspecified: Secondary | ICD-10-CM | POA: Diagnosis not present

## 2014-10-25 DIAGNOSIS — M13851 Other specified arthritis, right hip: Secondary | ICD-10-CM | POA: Diagnosis not present

## 2014-10-26 ENCOUNTER — Emergency Department: Payer: Self-pay | Admitting: Emergency Medicine

## 2014-10-26 DIAGNOSIS — L03116 Cellulitis of left lower limb: Secondary | ICD-10-CM | POA: Diagnosis not present

## 2014-10-26 DIAGNOSIS — L02416 Cutaneous abscess of left lower limb: Secondary | ICD-10-CM | POA: Diagnosis not present

## 2014-10-26 DIAGNOSIS — M6080 Other myositis, unspecified site: Secondary | ICD-10-CM | POA: Diagnosis not present

## 2014-10-26 DIAGNOSIS — G5600 Carpal tunnel syndrome, unspecified upper limb: Secondary | ICD-10-CM | POA: Diagnosis not present

## 2014-10-26 DIAGNOSIS — I10 Essential (primary) hypertension: Secondary | ICD-10-CM | POA: Diagnosis not present

## 2014-10-26 DIAGNOSIS — M13851 Other specified arthritis, right hip: Secondary | ICD-10-CM | POA: Diagnosis not present

## 2014-10-26 DIAGNOSIS — M65052 Abscess of tendon sheath, left thigh: Secondary | ICD-10-CM | POA: Diagnosis not present

## 2014-10-26 DIAGNOSIS — M4800 Spinal stenosis, site unspecified: Secondary | ICD-10-CM | POA: Diagnosis not present

## 2014-10-27 DIAGNOSIS — M13851 Other specified arthritis, right hip: Secondary | ICD-10-CM | POA: Diagnosis not present

## 2014-10-27 DIAGNOSIS — G5600 Carpal tunnel syndrome, unspecified upper limb: Secondary | ICD-10-CM | POA: Diagnosis not present

## 2014-10-27 DIAGNOSIS — M4800 Spinal stenosis, site unspecified: Secondary | ICD-10-CM | POA: Diagnosis not present

## 2014-10-27 DIAGNOSIS — M6080 Other myositis, unspecified site: Secondary | ICD-10-CM | POA: Diagnosis not present

## 2014-11-07 DIAGNOSIS — L0292 Furuncle, unspecified: Secondary | ICD-10-CM | POA: Diagnosis not present

## 2014-11-07 DIAGNOSIS — M4800 Spinal stenosis, site unspecified: Secondary | ICD-10-CM | POA: Diagnosis not present

## 2014-11-07 DIAGNOSIS — M6080 Other myositis, unspecified site: Secondary | ICD-10-CM | POA: Diagnosis not present

## 2014-11-07 DIAGNOSIS — G5600 Carpal tunnel syndrome, unspecified upper limb: Secondary | ICD-10-CM | POA: Diagnosis not present

## 2014-11-07 DIAGNOSIS — M13851 Other specified arthritis, right hip: Secondary | ICD-10-CM | POA: Diagnosis not present

## 2014-11-08 DIAGNOSIS — M4800 Spinal stenosis, site unspecified: Secondary | ICD-10-CM | POA: Diagnosis not present

## 2014-11-08 DIAGNOSIS — M13851 Other specified arthritis, right hip: Secondary | ICD-10-CM | POA: Diagnosis not present

## 2014-11-08 DIAGNOSIS — G5600 Carpal tunnel syndrome, unspecified upper limb: Secondary | ICD-10-CM | POA: Diagnosis not present

## 2014-11-08 DIAGNOSIS — M6080 Other myositis, unspecified site: Secondary | ICD-10-CM | POA: Diagnosis not present

## 2014-11-09 DIAGNOSIS — M13851 Other specified arthritis, right hip: Secondary | ICD-10-CM | POA: Diagnosis not present

## 2014-11-09 DIAGNOSIS — M4800 Spinal stenosis, site unspecified: Secondary | ICD-10-CM | POA: Diagnosis not present

## 2014-11-09 DIAGNOSIS — G5600 Carpal tunnel syndrome, unspecified upper limb: Secondary | ICD-10-CM | POA: Diagnosis not present

## 2014-11-09 DIAGNOSIS — M6080 Other myositis, unspecified site: Secondary | ICD-10-CM | POA: Diagnosis not present

## 2014-11-10 DIAGNOSIS — G5600 Carpal tunnel syndrome, unspecified upper limb: Secondary | ICD-10-CM | POA: Diagnosis not present

## 2014-11-10 DIAGNOSIS — M4800 Spinal stenosis, site unspecified: Secondary | ICD-10-CM | POA: Diagnosis not present

## 2014-11-10 DIAGNOSIS — M6080 Other myositis, unspecified site: Secondary | ICD-10-CM | POA: Diagnosis not present

## 2014-11-10 DIAGNOSIS — M13851 Other specified arthritis, right hip: Secondary | ICD-10-CM | POA: Diagnosis not present

## 2014-11-14 DIAGNOSIS — M6080 Other myositis, unspecified site: Secondary | ICD-10-CM | POA: Diagnosis not present

## 2014-11-14 DIAGNOSIS — M13851 Other specified arthritis, right hip: Secondary | ICD-10-CM | POA: Diagnosis not present

## 2014-11-14 DIAGNOSIS — M4800 Spinal stenosis, site unspecified: Secondary | ICD-10-CM | POA: Diagnosis not present

## 2014-11-14 DIAGNOSIS — G5600 Carpal tunnel syndrome, unspecified upper limb: Secondary | ICD-10-CM | POA: Diagnosis not present

## 2014-11-15 DIAGNOSIS — M6080 Other myositis, unspecified site: Secondary | ICD-10-CM | POA: Diagnosis not present

## 2014-11-15 DIAGNOSIS — M13851 Other specified arthritis, right hip: Secondary | ICD-10-CM | POA: Diagnosis not present

## 2014-11-15 DIAGNOSIS — G5600 Carpal tunnel syndrome, unspecified upper limb: Secondary | ICD-10-CM | POA: Diagnosis not present

## 2014-11-15 DIAGNOSIS — M4800 Spinal stenosis, site unspecified: Secondary | ICD-10-CM | POA: Diagnosis not present

## 2014-11-16 DIAGNOSIS — M13851 Other specified arthritis, right hip: Secondary | ICD-10-CM | POA: Diagnosis not present

## 2014-11-16 DIAGNOSIS — M4800 Spinal stenosis, site unspecified: Secondary | ICD-10-CM | POA: Diagnosis not present

## 2014-11-16 DIAGNOSIS — M6080 Other myositis, unspecified site: Secondary | ICD-10-CM | POA: Diagnosis not present

## 2014-11-16 DIAGNOSIS — G5600 Carpal tunnel syndrome, unspecified upper limb: Secondary | ICD-10-CM | POA: Diagnosis not present

## 2014-11-17 DIAGNOSIS — M6080 Other myositis, unspecified site: Secondary | ICD-10-CM | POA: Diagnosis not present

## 2014-11-17 DIAGNOSIS — M4800 Spinal stenosis, site unspecified: Secondary | ICD-10-CM | POA: Diagnosis not present

## 2014-11-17 DIAGNOSIS — M13851 Other specified arthritis, right hip: Secondary | ICD-10-CM | POA: Diagnosis not present

## 2014-11-17 DIAGNOSIS — G5600 Carpal tunnel syndrome, unspecified upper limb: Secondary | ICD-10-CM | POA: Diagnosis not present

## 2014-11-21 DIAGNOSIS — M13851 Other specified arthritis, right hip: Secondary | ICD-10-CM | POA: Diagnosis not present

## 2014-11-21 DIAGNOSIS — G5600 Carpal tunnel syndrome, unspecified upper limb: Secondary | ICD-10-CM | POA: Diagnosis not present

## 2014-11-21 DIAGNOSIS — M4800 Spinal stenosis, site unspecified: Secondary | ICD-10-CM | POA: Diagnosis not present

## 2014-11-21 DIAGNOSIS — M6080 Other myositis, unspecified site: Secondary | ICD-10-CM | POA: Diagnosis not present

## 2014-11-22 DIAGNOSIS — M4800 Spinal stenosis, site unspecified: Secondary | ICD-10-CM | POA: Diagnosis not present

## 2014-11-22 DIAGNOSIS — M13851 Other specified arthritis, right hip: Secondary | ICD-10-CM | POA: Diagnosis not present

## 2014-11-22 DIAGNOSIS — M6080 Other myositis, unspecified site: Secondary | ICD-10-CM | POA: Diagnosis not present

## 2014-11-22 DIAGNOSIS — G5600 Carpal tunnel syndrome, unspecified upper limb: Secondary | ICD-10-CM | POA: Diagnosis not present

## 2014-11-23 DIAGNOSIS — G5600 Carpal tunnel syndrome, unspecified upper limb: Secondary | ICD-10-CM | POA: Diagnosis not present

## 2014-11-23 DIAGNOSIS — M4800 Spinal stenosis, site unspecified: Secondary | ICD-10-CM | POA: Diagnosis not present

## 2014-11-23 DIAGNOSIS — M6080 Other myositis, unspecified site: Secondary | ICD-10-CM | POA: Diagnosis not present

## 2014-11-23 DIAGNOSIS — M13851 Other specified arthritis, right hip: Secondary | ICD-10-CM | POA: Diagnosis not present

## 2014-11-24 DIAGNOSIS — M13851 Other specified arthritis, right hip: Secondary | ICD-10-CM | POA: Diagnosis not present

## 2014-11-24 DIAGNOSIS — G5600 Carpal tunnel syndrome, unspecified upper limb: Secondary | ICD-10-CM | POA: Diagnosis not present

## 2014-11-24 DIAGNOSIS — M6080 Other myositis, unspecified site: Secondary | ICD-10-CM | POA: Diagnosis not present

## 2014-11-24 DIAGNOSIS — M4800 Spinal stenosis, site unspecified: Secondary | ICD-10-CM | POA: Diagnosis not present

## 2014-11-28 DIAGNOSIS — M6080 Other myositis, unspecified site: Secondary | ICD-10-CM | POA: Diagnosis not present

## 2014-11-28 DIAGNOSIS — M13851 Other specified arthritis, right hip: Secondary | ICD-10-CM | POA: Diagnosis not present

## 2014-11-28 DIAGNOSIS — G5600 Carpal tunnel syndrome, unspecified upper limb: Secondary | ICD-10-CM | POA: Diagnosis not present

## 2014-11-28 DIAGNOSIS — M4800 Spinal stenosis, site unspecified: Secondary | ICD-10-CM | POA: Diagnosis not present

## 2014-11-29 DIAGNOSIS — M6080 Other myositis, unspecified site: Secondary | ICD-10-CM | POA: Diagnosis not present

## 2014-11-29 DIAGNOSIS — M4800 Spinal stenosis, site unspecified: Secondary | ICD-10-CM | POA: Diagnosis not present

## 2014-11-29 DIAGNOSIS — G5600 Carpal tunnel syndrome, unspecified upper limb: Secondary | ICD-10-CM | POA: Diagnosis not present

## 2014-11-29 DIAGNOSIS — M13851 Other specified arthritis, right hip: Secondary | ICD-10-CM | POA: Diagnosis not present

## 2014-11-30 DIAGNOSIS — G5600 Carpal tunnel syndrome, unspecified upper limb: Secondary | ICD-10-CM | POA: Diagnosis not present

## 2014-11-30 DIAGNOSIS — M13851 Other specified arthritis, right hip: Secondary | ICD-10-CM | POA: Diagnosis not present

## 2014-11-30 DIAGNOSIS — M4800 Spinal stenosis, site unspecified: Secondary | ICD-10-CM | POA: Diagnosis not present

## 2014-11-30 DIAGNOSIS — M6080 Other myositis, unspecified site: Secondary | ICD-10-CM | POA: Diagnosis not present

## 2014-12-01 DIAGNOSIS — M13851 Other specified arthritis, right hip: Secondary | ICD-10-CM | POA: Diagnosis not present

## 2014-12-01 DIAGNOSIS — M6080 Other myositis, unspecified site: Secondary | ICD-10-CM | POA: Diagnosis not present

## 2014-12-01 DIAGNOSIS — M4800 Spinal stenosis, site unspecified: Secondary | ICD-10-CM | POA: Diagnosis not present

## 2014-12-01 DIAGNOSIS — G5600 Carpal tunnel syndrome, unspecified upper limb: Secondary | ICD-10-CM | POA: Diagnosis not present

## 2014-12-05 DIAGNOSIS — M6080 Other myositis, unspecified site: Secondary | ICD-10-CM | POA: Diagnosis not present

## 2014-12-05 DIAGNOSIS — M13851 Other specified arthritis, right hip: Secondary | ICD-10-CM | POA: Diagnosis not present

## 2014-12-05 DIAGNOSIS — M4800 Spinal stenosis, site unspecified: Secondary | ICD-10-CM | POA: Diagnosis not present

## 2014-12-05 DIAGNOSIS — G5600 Carpal tunnel syndrome, unspecified upper limb: Secondary | ICD-10-CM | POA: Diagnosis not present

## 2014-12-06 DIAGNOSIS — M13851 Other specified arthritis, right hip: Secondary | ICD-10-CM | POA: Diagnosis not present

## 2014-12-06 DIAGNOSIS — M4800 Spinal stenosis, site unspecified: Secondary | ICD-10-CM | POA: Diagnosis not present

## 2014-12-06 DIAGNOSIS — G5600 Carpal tunnel syndrome, unspecified upper limb: Secondary | ICD-10-CM | POA: Diagnosis not present

## 2014-12-06 DIAGNOSIS — M6080 Other myositis, unspecified site: Secondary | ICD-10-CM | POA: Diagnosis not present

## 2014-12-07 DIAGNOSIS — M13851 Other specified arthritis, right hip: Secondary | ICD-10-CM | POA: Diagnosis not present

## 2014-12-07 DIAGNOSIS — G5600 Carpal tunnel syndrome, unspecified upper limb: Secondary | ICD-10-CM | POA: Diagnosis not present

## 2014-12-07 DIAGNOSIS — M4800 Spinal stenosis, site unspecified: Secondary | ICD-10-CM | POA: Diagnosis not present

## 2014-12-07 DIAGNOSIS — M6080 Other myositis, unspecified site: Secondary | ICD-10-CM | POA: Diagnosis not present

## 2014-12-08 DIAGNOSIS — G5600 Carpal tunnel syndrome, unspecified upper limb: Secondary | ICD-10-CM | POA: Diagnosis not present

## 2014-12-08 DIAGNOSIS — M6080 Other myositis, unspecified site: Secondary | ICD-10-CM | POA: Diagnosis not present

## 2014-12-08 DIAGNOSIS — E118 Type 2 diabetes mellitus with unspecified complications: Secondary | ICD-10-CM | POA: Diagnosis not present

## 2014-12-08 DIAGNOSIS — M4800 Spinal stenosis, site unspecified: Secondary | ICD-10-CM | POA: Diagnosis not present

## 2014-12-08 DIAGNOSIS — M13851 Other specified arthritis, right hip: Secondary | ICD-10-CM | POA: Diagnosis not present

## 2014-12-12 DIAGNOSIS — G5600 Carpal tunnel syndrome, unspecified upper limb: Secondary | ICD-10-CM | POA: Diagnosis not present

## 2014-12-12 DIAGNOSIS — M13851 Other specified arthritis, right hip: Secondary | ICD-10-CM | POA: Diagnosis not present

## 2014-12-12 DIAGNOSIS — M4800 Spinal stenosis, site unspecified: Secondary | ICD-10-CM | POA: Diagnosis not present

## 2014-12-12 DIAGNOSIS — M6080 Other myositis, unspecified site: Secondary | ICD-10-CM | POA: Diagnosis not present

## 2014-12-13 DIAGNOSIS — M4800 Spinal stenosis, site unspecified: Secondary | ICD-10-CM | POA: Diagnosis not present

## 2014-12-13 DIAGNOSIS — M13851 Other specified arthritis, right hip: Secondary | ICD-10-CM | POA: Diagnosis not present

## 2014-12-13 DIAGNOSIS — G5600 Carpal tunnel syndrome, unspecified upper limb: Secondary | ICD-10-CM | POA: Diagnosis not present

## 2014-12-13 DIAGNOSIS — M6080 Other myositis, unspecified site: Secondary | ICD-10-CM | POA: Diagnosis not present

## 2014-12-14 DIAGNOSIS — M6080 Other myositis, unspecified site: Secondary | ICD-10-CM | POA: Diagnosis not present

## 2014-12-14 DIAGNOSIS — M13851 Other specified arthritis, right hip: Secondary | ICD-10-CM | POA: Diagnosis not present

## 2014-12-14 DIAGNOSIS — M545 Low back pain: Secondary | ICD-10-CM | POA: Diagnosis not present

## 2014-12-14 DIAGNOSIS — R300 Dysuria: Secondary | ICD-10-CM | POA: Diagnosis not present

## 2014-12-14 DIAGNOSIS — G5600 Carpal tunnel syndrome, unspecified upper limb: Secondary | ICD-10-CM | POA: Diagnosis not present

## 2014-12-14 DIAGNOSIS — M4800 Spinal stenosis, site unspecified: Secondary | ICD-10-CM | POA: Diagnosis not present

## 2014-12-14 DIAGNOSIS — J069 Acute upper respiratory infection, unspecified: Secondary | ICD-10-CM | POA: Diagnosis not present

## 2014-12-15 DIAGNOSIS — G5600 Carpal tunnel syndrome, unspecified upper limb: Secondary | ICD-10-CM | POA: Diagnosis not present

## 2014-12-15 DIAGNOSIS — M6080 Other myositis, unspecified site: Secondary | ICD-10-CM | POA: Diagnosis not present

## 2014-12-15 DIAGNOSIS — M4800 Spinal stenosis, site unspecified: Secondary | ICD-10-CM | POA: Diagnosis not present

## 2014-12-15 DIAGNOSIS — M13851 Other specified arthritis, right hip: Secondary | ICD-10-CM | POA: Diagnosis not present

## 2014-12-19 DIAGNOSIS — M4800 Spinal stenosis, site unspecified: Secondary | ICD-10-CM | POA: Diagnosis not present

## 2014-12-19 DIAGNOSIS — M13851 Other specified arthritis, right hip: Secondary | ICD-10-CM | POA: Diagnosis not present

## 2014-12-19 DIAGNOSIS — M6080 Other myositis, unspecified site: Secondary | ICD-10-CM | POA: Diagnosis not present

## 2014-12-19 DIAGNOSIS — G5600 Carpal tunnel syndrome, unspecified upper limb: Secondary | ICD-10-CM | POA: Diagnosis not present

## 2014-12-19 LAB — SURGICAL PATHOLOGY

## 2014-12-20 DIAGNOSIS — G5600 Carpal tunnel syndrome, unspecified upper limb: Secondary | ICD-10-CM | POA: Diagnosis not present

## 2014-12-20 DIAGNOSIS — M4800 Spinal stenosis, site unspecified: Secondary | ICD-10-CM | POA: Diagnosis not present

## 2014-12-20 DIAGNOSIS — M13851 Other specified arthritis, right hip: Secondary | ICD-10-CM | POA: Diagnosis not present

## 2014-12-20 DIAGNOSIS — M6080 Other myositis, unspecified site: Secondary | ICD-10-CM | POA: Diagnosis not present

## 2014-12-21 DIAGNOSIS — M13851 Other specified arthritis, right hip: Secondary | ICD-10-CM | POA: Diagnosis not present

## 2014-12-21 DIAGNOSIS — M4800 Spinal stenosis, site unspecified: Secondary | ICD-10-CM | POA: Diagnosis not present

## 2014-12-21 DIAGNOSIS — M6080 Other myositis, unspecified site: Secondary | ICD-10-CM | POA: Diagnosis not present

## 2014-12-21 DIAGNOSIS — G5600 Carpal tunnel syndrome, unspecified upper limb: Secondary | ICD-10-CM | POA: Diagnosis not present

## 2014-12-21 NOTE — Consult Note (Signed)
PATIENT NAME:  Christine Higgins, Christine Higgins MR#:  [id number] DATE OF BIRTH:  1956/07/10  DATE OF CONSULTATION:  08/26/2014  REFERRING PHYSICIAN:  Lawana Chambers, MD CONSULTING PHYSICIAN:  Tana Conch. Leslye Peer, MD  PRIMARY CARE PHYSICIAN: Ocie Cornfield. Ouida Sills, MD   REASON FOR CONSULTATION: Tachycardia.   HISTORY OF PRESENT ILLNESS: This is a 59 year old female who electively underwent a right total hip replacement on 08/24/2014 by Dr. Marry Guan. The procedure went well. She is walking a little bit on it with physical therapy, pain at times up to 8.5 out of 10 in intensity when she moves her right leg. She is having some pain in her hip and knee, and some swelling of the leg. The patient does feel some palpitations. Heart rate has gone up as high as 128, but has been elevated since she has been in the hospital. She is also noted to have a very low potassium of 2.8, and she does take high-dose hydrochlorothiazide 50 mg. Hospitalist services were contacted for further evaluation for tachycardia. The patient denies any chest pain or shortness of breath.   PAST MEDICAL HISTORY: Urinary incontinence, fibromyalgia, degenerative disk disease, hypertension, prediabetes, hypokalemia.   PAST SURGICAL HISTORY: Right total hip replacement done on 08/24/2014, 3 carpal tunnel surgeries, a C-section, a shoulder spur, and a laminectomy.   ALLERGIES: No known drug allergies.   MEDICATIONS: That the patient currently is on in the hospital include Tylenol ES [phone] mg q. 4 hours p.r.n. pain or fever, aluminum magnesium hydroxide with simethicone 30 mL q. 6 hours p.r.n. indigestion. Vitamin C 500 mg daily, atorvastatin 10 mg at bedtime, Lotrisone cream to affected area b.i.d., Dulcolax suppository 10 mg rectally p.r.n. constipation, calcium and vitamin D 1 tablet twice a day, Klonopin 1 mg at bedtime, vitamin B12 at 1500 mcg daily, Flexeril 10 mg every 8 hours p.r.n. muscle spasm, Senokot-S 1 tablet b.i.d., Cymbalta 60 mg daily, Cozaar  50 mg daily, milk of magnesia 30 mL b.i.d., morphine 1 to 5 mg IV push q. 4 hours p.r.n. severe pain, multivitamin 1 tablet daily, mupirocin 2% topical to affected area 3 times a day, omega-3 fatty acid 1 gram daily, Zofran 4 mg IV push q. 4 hours, Roxicodone 5 to 10 mg orally q 3 hours p.r.n. moderate pain, Protonix 40 mg b.i.d., trazodone 100 mg at bedtime, vitamin E 1000 international units daily, enoxaparin 30 mg subcutaneous injection q. 12 hours, Linzess 290 mcg daily, potassium replacement was ordered. The patient was also taking Dyazide 50/75 one tablet daily.   SOCIAL HISTORY: No smoking. No alcohol. No drug use. Is on disability since 2001, used to work with [employer].   FAMILY HISTORY: Father died of a heart issue after a surgical procedure, also had hypertension. Mother died of a tumor in her back and had hypertension.   REVIEW OF SYSTEMS:  CONSTITUTIONAL: Positive for chills. Positive for sweats. No fever. Positive for fatigue. No weight loss. No weight gain.  EYES: No blurry vision.  EARS, NOSE, MOUTH AND THROAT: Positive for runny nose. No sore throat. No difficulty swallowing.  CARDIOVASCULAR: No chest pain. Positive for palpitations.  RESPIRATORY: No shortness of breath. Occasional cough. No sputum. No hemoptysis.  GASTROINTESTINAL: Positive for constipation. No nausea. No vomiting. No abdominal pain. No bright red blood per rectum. No melena.  GENITOURINARY: No burning on urination or hematuria.  MUSCULOSKELETAL: Positive for right hip pain and right knee pain and muscle burning pain.  INTEGUMENT: No rashes or eruptions.  NEUROLOGIC: No fainting  or blackouts.  PSYCHIATRIC: No anxiety or depression.  ENDOCRINE: No thyroid problems.  HEMATOLOGIC AND LYMPHATIC: No anemia.   PHYSICAL EXAMINATION: VITAL SIGNS: Pulse 128, respirations 19, blood pressure 120/69, pulse oximetry 95% on room air, temperature 98.3.  GENERAL: No respiratory distress, sitting up in bed.  EYES:  Conjunctivae and lids normal. Pupils equal, round, and reactive to light. Extraocular muscles intact. No nystagmus.  EARS, NOSE, MOUTH, AND THROAT: Tympanic membranes: No erythema. Nasal mucosa: No erythema. Throat: No erythema. No exudate seen. Lips and gums: No lesions.  NECK: No JVD. No bruits. No lymphadenopathy. No thyromegaly. No thyroid nodules palpated.  RESPIRATORY: Lungs clear to auscultation. No use of accessory muscles to breathe. No rhonchi, rales, or wheeze heard.  CARDIOVASCULAR SYSTEM: S1 and S2, tachycardic. No gallops, rubs, or murmurs heard. Carotid upstroke 2+ bilaterally. No bruits. Dorsalis pedis pulses 1+ bilaterally, 2+ edema bilateral lower extremity.  ABDOMEN: Soft, nontender, no organomegaly/splenomegaly. Normoactive bowel sounds. No masses felt.  LYMPHATIC: No lymph nodes in the neck.  MUSCULOSKELETAL: No clubbing or cyanosis, 2+ edema.  PSYCHIATRIC: The patient is alert, oriented to person, place, and time.  NEUROLOGIC: Cranial nerves II through XII grossly intact.  SKIN: No ulcers or lesions seen.   LABORATORY AND RADIOLOGICAL DATA: Hemoglobin 10.2. Glucose 155, BUN 5, creatinine 0.74, sodium 132, potassium 2.8, chloride 95, CO2 31, calcium 8.6. I added on a magnesium to the morning labs, it was 1.7, platelet count 148. A stat EKG was done, which showed sinus tachycardia at 121 beats per minute.   ASSESSMENT AND PLAN: 1. Severe hypokalemia. I think the issue here is that she is taking high-dose Maxzide,, which is hydrochlorothiazide 50 mg and triamterene 75 mg. Since she states that she also had a preoperatively with low potassium, I will get rid of the Maxzide and replace potassium orally and IV, and recheck in the a.m.  2. Hypomagnesemia. We will replace magnesium 2 grams IV x 1 stat, and continue to monitor that in the a.m. also.  3. Sinus tachycardia. Could be secondary to pain. Could also be secondary to the electrolyte abnormalities. We will control pain. Normally  I do not want to treat sinus tachycardia, but since the patient is going be removed off the Maxzide, I will use metoprolol rule to compensate for blood pressure and continue to monitor.  4. Hypertension. Blood pressure currently stable, but may elevate with removal of the Maxzide, and I will start metoprolol low-dose instead.  5. Hip pain and knee pain. I will get an ultrasound of the right lower extremity to rule out deep vein thrombosis I will let orthopedic evaluate this. Likely is secondary to the surgical procedure.  6. Fibromyalgia. The patient does take pain medication at home.  7. Hyperlipidemia. Continue Lipitor.  8. Gastroesophageal reflux disease. The patient does take Nexium as outpatient and is on Protonix here.  9. Constipation. The patient will need a bowel movement prior to going home.  TIME SPENT ON CONSULTATION: 55 minutes.    ____________________________ Tana Conch. Leslye Peer, MD rjw:[id number] D: 08/26/2014 13:02:10 ET T: 08/26/2014 13:15:51 ET JOB#: [id number]  cc: Tana Conch. Leslye Peer, MD, <Dictator> Lawana Chambers, MD Ocie Cornfield. Ouida Sills, [place] MD ELECTRONICALLY SIGNED 08/26/2014 16:44

## 2014-12-21 NOTE — Op Note (Signed)
PATIENT NAME:  Christine Higgins, Christine Higgins MR#:  [id number] DATE OF BIRTH:  12/14/1955  DATE OF PROCEDURE:  08/24/2014  PREOPERATIVE DIAGNOSIS: Degenerative arthrosis of the right hip (primary).   POSTOPERATIVE DIAGNOSIS: Degenerative arthrosis of the right hip (primary).   PROCEDURE PERFORMED: Right total hip arthroplasty.   SURGEON: Skip Estimable, MD.   ASSISTANT: Vance Peper, PA (required to maintain retraction throughout the procedure).   ANESTHESIA: Spinal.   ESTIMATED BLOOD LOSS: 500 mL.   FLUIDS REPLACED: 2600 mL of crystalloid.   DRAINS: Two medium drains to Hemovac reservoir.   IMPLANTS UTILIZED: DePuy 15 mm small stature AML femoral stem, 52 mm outer diameter Pinnacle Gription Sector acetabular shell, 10 degree + 4 mm Pinnacle Marathon polyethylene liner, and a 36 mm outer diameter M-SPEC femoral head with a + 1.5 mm neck length.   INDICATIONS FOR SURGERY: The patient is a 59 year old female who has been seen for complaints of progressive right hip and groin pain. X-rays demonstrated severe degenerative changes with full-thickness loss of articular cartilage superiorly. After discussion of the risks and benefits of surgical intervention, the patient expressed understanding of the risks and benefits and agreed with plans for surgical intervention.   PROCEDURE IN DETAIL: The patient was brought to the operating room and, after adequate spinal anesthesia was achieved, the patient was placed in a left lateral decubitus position. Axillary roll was placed and all bony prominences were well padded. The patient's right hip and leg were cleaned and prepped with alcohol and DuraPrep and draped in the usual sterile fashion. A "timeout" was performed as per usual protocol. A lateral curvilinear incision was made gently curving towards the posterior superior iliac spine. IT band was incised in line with the skin incision. Fibers of the gluteus maximus were split in line. Dissection was carried down and the  piriformis tendon was identified, skeletonized, incised at its insertion on the proximal femur and reflected posteriorly. In a similar fashion, the short external rotators were incised and reflected posteriorly. A T-type posterior capsulotomy was performed. Prior to dislocation of the femoral head, a threaded Steinmann pin was inserted through a separate stab incision into the pelvis superior to the acetabulum and bent in the form of a stylus so as to assess limb length and hip offset throughout the procedure. The femoral head was then dislocated posteriorly. Severe degenerative changes were noted with full-thickness loss of articular cartilage and some evidence of delamination. The femoral neck cut was performed using a oscillating saw and the anterior capsule was elevated off the femoral neck. Inspection of the acetabulum also demonstrated severe degenerative changes. The labrum was excised using electrocautery. The acetabulum was reamed in a sequential fashion up to a 51 mm diameter. This allowed for good punctate bleeding bone. Several small subchondral cysts were debrided using curettes. A 52 mm outer diameter Pinnacle Gription Sector acetabular shell was positioned and impacted into place. Good scratch fit was appreciated. A + 4 neutral polyethylene trial was inserted and attention was directed to the proximal femur. Pilot hole for reaming of the proximal femoral canal was created using a high-speed burr. Proximal femoral canal was reamed in a sequential fashion up to a 14.5 mm diameter. This allowed for approximately 6 cm of scratch fit. The proximal femur was then prepared using 15 mm aggressive side-biting reamer. Serial broaches were inserted up to a 15 mm small stature broach. The calcar region was planed and trial reduction was performed with a 36 mm hip ball with a +  1.5 mm neck length. Good equalization of limb lengths was appreciated and good reproduction of hip offset was noted. The hip was felt to  be very stable anteriorly and reasonably stable posteriorly. However, it was elected to trial with a 10 mm lip positioned at approximately the 8 o'clock position. This allowed for improved posterior stability. Trial components were removed. The acetabular shell was irrigated with antibiotic solution and suctioned dry.  A + 4 mm 10 degree Pinnacle Marathon polyethylene insert was positioned with the high side at 8 o'clock position and the liner was impacted into place. Next, a 15 mm small stature AML femoral component was positioned and impacted into place. Excellent scratch fit was appreciated. Trial reduction was again performed with a 36 mm hip ball with a + 1.5 mm neck length. Again, good equalization of limb lengths and good reproduction of hip offset was noted. Excellent stability was noted both anteriorly and posteriorly. Trial hip ball was removed. The Morse taper was cleaned and dried. A 36 mm M-SPEC femoral head with a + 1.5 mm neck length was placed on the trunnion and impacted into place. The hip was reduced and placed through a range of motion with excellent stability noted.   The wound was irrigated with copious amounts of normal saline with antibiotic solution using pulsatile lavage and then suctioned dry. Good hemostasis was noted. The posterior capsulotomy was repaired using number 5 Ethibond. The piriformis tendon was reapproximated to undersurface of the gluteus medius tendon using number 5 Ethibond. Two medium drains were placed in the wound bed and brought out through a separate stab incision to be attached to a Hemovac reservoir. The IT band was repaired using interrupted sutures of number 1 Vicryl. The subcutaneous tissue was approximated in layers using first number 0 Vicryl followed by number 2-0 Vicryl. Skin was closed with skin staples. Sterile dressing was applied.   The patient tolerated the procedure well. She was transported to the recovery room in stable condition.      ____________________________ Laurice Record. Holley Bouche., MD jph:bu D: 08/24/2014 20:02:11 ET T: 08/24/2014 20:41:28 ET JOB#: [id number]  cc: Laurice Record. Holley Bouche., MD, <Dictator> JAMES P Holley Bouche MD ELECTRONICALLY SIGNED 09/12/2014 0:49

## 2014-12-22 ENCOUNTER — Other Ambulatory Visit: Payer: Self-pay

## 2014-12-22 DIAGNOSIS — Z1231 Encounter for screening mammogram for malignant neoplasm of breast: Secondary | ICD-10-CM

## 2014-12-22 DIAGNOSIS — G5600 Carpal tunnel syndrome, unspecified upper limb: Secondary | ICD-10-CM | POA: Diagnosis not present

## 2014-12-22 DIAGNOSIS — M13851 Other specified arthritis, right hip: Secondary | ICD-10-CM | POA: Diagnosis not present

## 2014-12-22 DIAGNOSIS — M6080 Other myositis, unspecified site: Secondary | ICD-10-CM | POA: Diagnosis not present

## 2014-12-22 DIAGNOSIS — M4800 Spinal stenosis, site unspecified: Secondary | ICD-10-CM | POA: Diagnosis not present

## 2014-12-25 NOTE — Discharge Summary (Signed)
PATIENT NAME:  Christine Higgins, DRAGOO MR#:  [id number] DATE OF BIRTH:  1956-08-02  DATE OF ADMISSION:  08/24/2014 DATE OF DISCHARGE:  08/28/2014  ADMITTING DIAGNOSIS: Degenerative arthrosis of right hip.   DISCHARGE DIAGNOSIS: Degenerative arthrosis of right hip.  CONSULTATIONS: Dr. Leslye Peer and Dr. Ola Spurr.   HISTORY: The patient is 59 year old female who has been followed at [facility] for progression of right hip pain. The patient reported a long history of progressive left hip and groin pain. Her pain was noted to be aggravated with weight-bearing activities as well as extreme range of motion. She had noted some near giving way as well as some crepitus with range of motion of the hip. She had appreciated some decrease in her hip range of motion. She had reported only modest improvement in her condition despite the use of Cymbalta, Flexeril and Norco, as well as intra-articular cortisone injection. At the time of surgery, she was using a cane for ambulation. The pain had progressed to the point that it was significantly interfering with her activities of daily living. X-rays taken in [facility] showed significant narrowing of the cartilage space with bone-on-bone articulation being noted superiorly. There was collapse of the superior femoral head. Subchondral sclerosis as well as osteophyte formation was noted. After discussion of the risks and benefits of surgical intervention, the patient expressed her understanding of the risks and benefits and agreed for plans for surgical intervention.   PROCEDURE: Right total hip arthroplasty.   ANESTHESIA: Spinal.   IMPLANTS UTILIZED: DePuy 15 mm small stature AML femoral stem, 52 mm outer diameter Pinnacle Gription sector acetabular shell, 10 degree +4 mm Pinnacle Marathon polyethylene liner, and a 36 mm outer diameter M-SPEC femoral head with a +1.5 mm neck length.   HOSPITAL COURSE: The patient tolerated the procedure very well. She had no  complications. She was then taken to the PAC-U where she was stabilized and then transferred to the orthopedic floor. She began receiving anticoagulation therapy of Lovenox 30 mg subcutaneous every 12 hours per anesthesia and pharmacy protocol. She was fitted with TED stockings bilaterally. These were allowed to be removed 1 hour per 8 hour shift. The patient was also fitted with AVI compression foot pumps set at 80 mmHg. Her calves have been nontender. There has been no evidence of any DVTs. Negative Homans sign. The patient actually did have a Doppler study well in the hospital which did not reveal any DVTs as well. Heels were elevated off the bed using rolled towels.   The patient has denied any chest pain or shortness of breath. For the most part vital signs have been stable. She has been afebrile. She did become slightly tachycardic near the end of her admission, but this was ruled out as being any cardiac issues. It was felt to be secondary to pain issues. This was the reason for the consult. Hemodynamically she was stable and no transfusions were given. She has been afebrile throughout the hospital course.   Physical therapy was initiated on day 1 for gait training and transfers. Due to her obesity and deconditioning rehab has been slow. However, after receiving physical therapy she was able to ambulate 200 feet, was able to go up 4 steps, was independent with bed to chair transfers. Occupational therapy was also initiated on day 1 for ADL and assistive devices.   The patient's IV, Foley and Hemovac were DC'd on day 2 along with a dressing change. The wound was free of any drainage or signs  of infection.   DISPOSITION: The patient is discharged to home in improved stable condition.   DISCHARGE INSTRUCTIONS: She may continue to weight bear as tolerated. Continue using a rolling walker until cleared by physical therapy to go to a quad cane. She will receive home health PT. She was instructed on  elevation of the lower extremity. Elevate the heels off the bed. Posterior hip precautions were gone over with the patient. Continue with TED stockings. These are to be worn bilaterally. These may be removed at night but are to be worn during the day.  Incentive spirometer q. 1 hour while awake. Encourage the patient to do cough and deep breathing q. 2 hours while awake. She is placed on a regular diet. Wound care was instructed with the patient. Jodell Cipro will be removed on January 79UX followed by application of benzoin and Steri-Strips. This will be performed by the physical therapist. She is to call the clinic if she has any temperatures of 101.5 or greater or excessive bleeding. She has a follow-up appointment on February 11th at 9:15.   The patient may resume her regular medication that she was on prior to admission. She was given a prescription for Lovenox 40 mg subcutaneously daily for 14 days and then discontinue and begin taking one 81 mg enteric-coated aspirin, also a prescription for Roxicodone 5 to 10 mg every 4 to 6 hours p.r.n. for pain and Tramadol 50 to 100 mg every 4 to 6 hours p.r.n. for pain.   PAST MEDICAL HISTORY: Breast lumps, hypertension, osteoarthritis, glaucoma, fibromyalgia, migraines, gastroesophageal reflux disease, hemorrhoids, cervical disk disease, lumbar disk disease, carpal tunnel syndrome, obesity, hyperlipidemia, chronic pain syndrome.  ____________________________ Vance Peper, PA [id number] D: 09/02/2014 08:19:33 ET T: 09/02/2014 11:20:35 ET JOB#: [id number]  cc: Vance Peper, PA, <Dictator> JON WOLFE PA ELECTRONICALLY SIGNED 09/07/2014 13:45

## 2014-12-26 DIAGNOSIS — M4800 Spinal stenosis, site unspecified: Secondary | ICD-10-CM | POA: Diagnosis not present

## 2014-12-26 DIAGNOSIS — M6080 Other myositis, unspecified site: Secondary | ICD-10-CM | POA: Diagnosis not present

## 2014-12-26 DIAGNOSIS — M13851 Other specified arthritis, right hip: Secondary | ICD-10-CM | POA: Diagnosis not present

## 2014-12-26 DIAGNOSIS — G5600 Carpal tunnel syndrome, unspecified upper limb: Secondary | ICD-10-CM | POA: Diagnosis not present

## 2014-12-27 DIAGNOSIS — M4800 Spinal stenosis, site unspecified: Secondary | ICD-10-CM | POA: Diagnosis not present

## 2014-12-27 DIAGNOSIS — M13851 Other specified arthritis, right hip: Secondary | ICD-10-CM | POA: Diagnosis not present

## 2014-12-27 DIAGNOSIS — G5600 Carpal tunnel syndrome, unspecified upper limb: Secondary | ICD-10-CM | POA: Diagnosis not present

## 2014-12-27 DIAGNOSIS — M6080 Other myositis, unspecified site: Secondary | ICD-10-CM | POA: Diagnosis not present

## 2014-12-28 DIAGNOSIS — M6080 Other myositis, unspecified site: Secondary | ICD-10-CM | POA: Diagnosis not present

## 2014-12-28 DIAGNOSIS — M13851 Other specified arthritis, right hip: Secondary | ICD-10-CM | POA: Diagnosis not present

## 2014-12-28 DIAGNOSIS — M4800 Spinal stenosis, site unspecified: Secondary | ICD-10-CM | POA: Diagnosis not present

## 2014-12-28 DIAGNOSIS — G5600 Carpal tunnel syndrome, unspecified upper limb: Secondary | ICD-10-CM | POA: Diagnosis not present

## 2014-12-29 DIAGNOSIS — M13851 Other specified arthritis, right hip: Secondary | ICD-10-CM | POA: Diagnosis not present

## 2014-12-29 DIAGNOSIS — M6080 Other myositis, unspecified site: Secondary | ICD-10-CM | POA: Diagnosis not present

## 2014-12-29 DIAGNOSIS — G5600 Carpal tunnel syndrome, unspecified upper limb: Secondary | ICD-10-CM | POA: Diagnosis not present

## 2014-12-29 DIAGNOSIS — M4800 Spinal stenosis, site unspecified: Secondary | ICD-10-CM | POA: Diagnosis not present

## 2015-01-02 DIAGNOSIS — M6080 Other myositis, unspecified site: Secondary | ICD-10-CM | POA: Diagnosis not present

## 2015-01-02 DIAGNOSIS — M13851 Other specified arthritis, right hip: Secondary | ICD-10-CM | POA: Diagnosis not present

## 2015-01-02 DIAGNOSIS — G5600 Carpal tunnel syndrome, unspecified upper limb: Secondary | ICD-10-CM | POA: Diagnosis not present

## 2015-01-02 DIAGNOSIS — M4800 Spinal stenosis, site unspecified: Secondary | ICD-10-CM | POA: Diagnosis not present

## 2015-01-03 DIAGNOSIS — G5600 Carpal tunnel syndrome, unspecified upper limb: Secondary | ICD-10-CM | POA: Diagnosis not present

## 2015-01-03 DIAGNOSIS — M6080 Other myositis, unspecified site: Secondary | ICD-10-CM | POA: Diagnosis not present

## 2015-01-03 DIAGNOSIS — M13851 Other specified arthritis, right hip: Secondary | ICD-10-CM | POA: Diagnosis not present

## 2015-01-03 DIAGNOSIS — M4800 Spinal stenosis, site unspecified: Secondary | ICD-10-CM | POA: Diagnosis not present

## 2015-01-04 DIAGNOSIS — M13851 Other specified arthritis, right hip: Secondary | ICD-10-CM | POA: Diagnosis not present

## 2015-01-04 DIAGNOSIS — G5600 Carpal tunnel syndrome, unspecified upper limb: Secondary | ICD-10-CM | POA: Diagnosis not present

## 2015-01-04 DIAGNOSIS — M6080 Other myositis, unspecified site: Secondary | ICD-10-CM | POA: Diagnosis not present

## 2015-01-04 DIAGNOSIS — M4800 Spinal stenosis, site unspecified: Secondary | ICD-10-CM | POA: Diagnosis not present

## 2015-01-05 DIAGNOSIS — M6080 Other myositis, unspecified site: Secondary | ICD-10-CM | POA: Diagnosis not present

## 2015-01-05 DIAGNOSIS — G5600 Carpal tunnel syndrome, unspecified upper limb: Secondary | ICD-10-CM | POA: Diagnosis not present

## 2015-01-05 DIAGNOSIS — M13851 Other specified arthritis, right hip: Secondary | ICD-10-CM | POA: Diagnosis not present

## 2015-01-05 DIAGNOSIS — M4800 Spinal stenosis, site unspecified: Secondary | ICD-10-CM | POA: Diagnosis not present

## 2015-01-09 DIAGNOSIS — M4800 Spinal stenosis, site unspecified: Secondary | ICD-10-CM | POA: Diagnosis not present

## 2015-01-09 DIAGNOSIS — M6080 Other myositis, unspecified site: Secondary | ICD-10-CM | POA: Diagnosis not present

## 2015-01-09 DIAGNOSIS — M13851 Other specified arthritis, right hip: Secondary | ICD-10-CM | POA: Diagnosis not present

## 2015-01-09 DIAGNOSIS — G5600 Carpal tunnel syndrome, unspecified upper limb: Secondary | ICD-10-CM | POA: Diagnosis not present

## 2015-01-10 DIAGNOSIS — M6080 Other myositis, unspecified site: Secondary | ICD-10-CM | POA: Diagnosis not present

## 2015-01-10 DIAGNOSIS — M4800 Spinal stenosis, site unspecified: Secondary | ICD-10-CM | POA: Diagnosis not present

## 2015-01-10 DIAGNOSIS — E119 Type 2 diabetes mellitus without complications: Secondary | ICD-10-CM | POA: Diagnosis not present

## 2015-01-10 DIAGNOSIS — M13851 Other specified arthritis, right hip: Secondary | ICD-10-CM | POA: Diagnosis not present

## 2015-01-10 DIAGNOSIS — G5600 Carpal tunnel syndrome, unspecified upper limb: Secondary | ICD-10-CM | POA: Diagnosis not present

## 2015-01-11 DIAGNOSIS — M6080 Other myositis, unspecified site: Secondary | ICD-10-CM | POA: Diagnosis not present

## 2015-01-11 DIAGNOSIS — M13851 Other specified arthritis, right hip: Secondary | ICD-10-CM | POA: Diagnosis not present

## 2015-01-11 DIAGNOSIS — M4800 Spinal stenosis, site unspecified: Secondary | ICD-10-CM | POA: Diagnosis not present

## 2015-01-11 DIAGNOSIS — G5600 Carpal tunnel syndrome, unspecified upper limb: Secondary | ICD-10-CM | POA: Diagnosis not present

## 2015-01-12 DIAGNOSIS — M4800 Spinal stenosis, site unspecified: Secondary | ICD-10-CM | POA: Diagnosis not present

## 2015-01-12 DIAGNOSIS — M13851 Other specified arthritis, right hip: Secondary | ICD-10-CM | POA: Diagnosis not present

## 2015-01-12 DIAGNOSIS — M6080 Other myositis, unspecified site: Secondary | ICD-10-CM | POA: Diagnosis not present

## 2015-01-12 DIAGNOSIS — G5600 Carpal tunnel syndrome, unspecified upper limb: Secondary | ICD-10-CM | POA: Diagnosis not present

## 2015-01-16 DIAGNOSIS — M4800 Spinal stenosis, site unspecified: Secondary | ICD-10-CM | POA: Diagnosis not present

## 2015-01-16 DIAGNOSIS — G5600 Carpal tunnel syndrome, unspecified upper limb: Secondary | ICD-10-CM | POA: Diagnosis not present

## 2015-01-16 DIAGNOSIS — M13851 Other specified arthritis, right hip: Secondary | ICD-10-CM | POA: Diagnosis not present

## 2015-01-16 DIAGNOSIS — M6080 Other myositis, unspecified site: Secondary | ICD-10-CM | POA: Diagnosis not present

## 2015-01-17 DIAGNOSIS — M13851 Other specified arthritis, right hip: Secondary | ICD-10-CM | POA: Diagnosis not present

## 2015-01-17 DIAGNOSIS — G5600 Carpal tunnel syndrome, unspecified upper limb: Secondary | ICD-10-CM | POA: Diagnosis not present

## 2015-01-17 DIAGNOSIS — K210 Gastro-esophageal reflux disease with esophagitis: Secondary | ICD-10-CM | POA: Diagnosis not present

## 2015-01-17 DIAGNOSIS — R1013 Epigastric pain: Secondary | ICD-10-CM | POA: Diagnosis not present

## 2015-01-17 DIAGNOSIS — M4800 Spinal stenosis, site unspecified: Secondary | ICD-10-CM | POA: Diagnosis not present

## 2015-01-17 DIAGNOSIS — M6080 Other myositis, unspecified site: Secondary | ICD-10-CM | POA: Diagnosis not present

## 2015-01-17 DIAGNOSIS — K6289 Other specified diseases of anus and rectum: Secondary | ICD-10-CM | POA: Diagnosis not present

## 2015-01-17 DIAGNOSIS — K589 Irritable bowel syndrome without diarrhea: Secondary | ICD-10-CM | POA: Diagnosis not present

## 2015-01-18 DIAGNOSIS — M6080 Other myositis, unspecified site: Secondary | ICD-10-CM | POA: Diagnosis not present

## 2015-01-18 DIAGNOSIS — G5600 Carpal tunnel syndrome, unspecified upper limb: Secondary | ICD-10-CM | POA: Diagnosis not present

## 2015-01-18 DIAGNOSIS — M13851 Other specified arthritis, right hip: Secondary | ICD-10-CM | POA: Diagnosis not present

## 2015-01-18 DIAGNOSIS — M4800 Spinal stenosis, site unspecified: Secondary | ICD-10-CM | POA: Diagnosis not present

## 2015-01-19 DIAGNOSIS — M6080 Other myositis, unspecified site: Secondary | ICD-10-CM | POA: Diagnosis not present

## 2015-01-19 DIAGNOSIS — M4800 Spinal stenosis, site unspecified: Secondary | ICD-10-CM | POA: Diagnosis not present

## 2015-01-19 DIAGNOSIS — M13851 Other specified arthritis, right hip: Secondary | ICD-10-CM | POA: Diagnosis not present

## 2015-01-19 DIAGNOSIS — G5600 Carpal tunnel syndrome, unspecified upper limb: Secondary | ICD-10-CM | POA: Diagnosis not present

## 2015-01-23 DIAGNOSIS — G5600 Carpal tunnel syndrome, unspecified upper limb: Secondary | ICD-10-CM | POA: Diagnosis not present

## 2015-01-23 DIAGNOSIS — M13851 Other specified arthritis, right hip: Secondary | ICD-10-CM | POA: Diagnosis not present

## 2015-01-23 DIAGNOSIS — M4800 Spinal stenosis, site unspecified: Secondary | ICD-10-CM | POA: Diagnosis not present

## 2015-01-23 DIAGNOSIS — M6080 Other myositis, unspecified site: Secondary | ICD-10-CM | POA: Diagnosis not present

## 2015-01-24 DIAGNOSIS — M6080 Other myositis, unspecified site: Secondary | ICD-10-CM | POA: Diagnosis not present

## 2015-01-24 DIAGNOSIS — M13851 Other specified arthritis, right hip: Secondary | ICD-10-CM | POA: Diagnosis not present

## 2015-01-24 DIAGNOSIS — G5600 Carpal tunnel syndrome, unspecified upper limb: Secondary | ICD-10-CM | POA: Diagnosis not present

## 2015-01-24 DIAGNOSIS — M4800 Spinal stenosis, site unspecified: Secondary | ICD-10-CM | POA: Diagnosis not present

## 2015-01-25 DIAGNOSIS — M13851 Other specified arthritis, right hip: Secondary | ICD-10-CM | POA: Diagnosis not present

## 2015-01-25 DIAGNOSIS — M4800 Spinal stenosis, site unspecified: Secondary | ICD-10-CM | POA: Diagnosis not present

## 2015-01-25 DIAGNOSIS — G5600 Carpal tunnel syndrome, unspecified upper limb: Secondary | ICD-10-CM | POA: Diagnosis not present

## 2015-01-25 DIAGNOSIS — M6080 Other myositis, unspecified site: Secondary | ICD-10-CM | POA: Diagnosis not present

## 2015-01-26 DIAGNOSIS — M13851 Other specified arthritis, right hip: Secondary | ICD-10-CM | POA: Diagnosis not present

## 2015-01-26 DIAGNOSIS — M4800 Spinal stenosis, site unspecified: Secondary | ICD-10-CM | POA: Diagnosis not present

## 2015-01-26 DIAGNOSIS — G5600 Carpal tunnel syndrome, unspecified upper limb: Secondary | ICD-10-CM | POA: Diagnosis not present

## 2015-01-26 DIAGNOSIS — M6080 Other myositis, unspecified site: Secondary | ICD-10-CM | POA: Diagnosis not present

## 2015-01-30 DIAGNOSIS — M13851 Other specified arthritis, right hip: Secondary | ICD-10-CM | POA: Diagnosis not present

## 2015-01-30 DIAGNOSIS — M6080 Other myositis, unspecified site: Secondary | ICD-10-CM | POA: Diagnosis not present

## 2015-01-30 DIAGNOSIS — M4800 Spinal stenosis, site unspecified: Secondary | ICD-10-CM | POA: Diagnosis not present

## 2015-01-30 DIAGNOSIS — G5600 Carpal tunnel syndrome, unspecified upper limb: Secondary | ICD-10-CM | POA: Diagnosis not present

## 2015-01-31 ENCOUNTER — Ambulatory Visit: Payer: Commercial Managed Care - HMO

## 2015-01-31 DIAGNOSIS — G5600 Carpal tunnel syndrome, unspecified upper limb: Secondary | ICD-10-CM | POA: Diagnosis not present

## 2015-01-31 DIAGNOSIS — M13851 Other specified arthritis, right hip: Secondary | ICD-10-CM | POA: Diagnosis not present

## 2015-01-31 DIAGNOSIS — M6080 Other myositis, unspecified site: Secondary | ICD-10-CM | POA: Diagnosis not present

## 2015-01-31 DIAGNOSIS — M4800 Spinal stenosis, site unspecified: Secondary | ICD-10-CM | POA: Diagnosis not present

## 2015-01-31 DIAGNOSIS — H5210 Myopia, unspecified eye: Secondary | ICD-10-CM | POA: Diagnosis not present

## 2015-01-31 DIAGNOSIS — I10 Essential (primary) hypertension: Secondary | ICD-10-CM | POA: Diagnosis not present

## 2015-02-01 DIAGNOSIS — M6080 Other myositis, unspecified site: Secondary | ICD-10-CM | POA: Diagnosis not present

## 2015-02-01 DIAGNOSIS — M13851 Other specified arthritis, right hip: Secondary | ICD-10-CM | POA: Diagnosis not present

## 2015-02-01 DIAGNOSIS — G5600 Carpal tunnel syndrome, unspecified upper limb: Secondary | ICD-10-CM | POA: Diagnosis not present

## 2015-02-01 DIAGNOSIS — M4800 Spinal stenosis, site unspecified: Secondary | ICD-10-CM | POA: Diagnosis not present

## 2015-02-02 DIAGNOSIS — M6080 Other myositis, unspecified site: Secondary | ICD-10-CM | POA: Diagnosis not present

## 2015-02-02 DIAGNOSIS — G5600 Carpal tunnel syndrome, unspecified upper limb: Secondary | ICD-10-CM | POA: Diagnosis not present

## 2015-02-02 DIAGNOSIS — M4800 Spinal stenosis, site unspecified: Secondary | ICD-10-CM | POA: Diagnosis not present

## 2015-02-02 DIAGNOSIS — M13851 Other specified arthritis, right hip: Secondary | ICD-10-CM | POA: Diagnosis not present

## 2015-02-06 DIAGNOSIS — M4800 Spinal stenosis, site unspecified: Secondary | ICD-10-CM | POA: Diagnosis not present

## 2015-02-06 DIAGNOSIS — G5600 Carpal tunnel syndrome, unspecified upper limb: Secondary | ICD-10-CM | POA: Diagnosis not present

## 2015-02-06 DIAGNOSIS — M13851 Other specified arthritis, right hip: Secondary | ICD-10-CM | POA: Diagnosis not present

## 2015-02-06 DIAGNOSIS — M6080 Other myositis, unspecified site: Secondary | ICD-10-CM | POA: Diagnosis not present

## 2015-02-07 DIAGNOSIS — M4800 Spinal stenosis, site unspecified: Secondary | ICD-10-CM | POA: Diagnosis not present

## 2015-02-07 DIAGNOSIS — M13851 Other specified arthritis, right hip: Secondary | ICD-10-CM | POA: Diagnosis not present

## 2015-02-07 DIAGNOSIS — M6080 Other myositis, unspecified site: Secondary | ICD-10-CM | POA: Diagnosis not present

## 2015-02-07 DIAGNOSIS — G5600 Carpal tunnel syndrome, unspecified upper limb: Secondary | ICD-10-CM | POA: Diagnosis not present

## 2015-02-08 DIAGNOSIS — G5600 Carpal tunnel syndrome, unspecified upper limb: Secondary | ICD-10-CM | POA: Diagnosis not present

## 2015-02-08 DIAGNOSIS — M13851 Other specified arthritis, right hip: Secondary | ICD-10-CM | POA: Diagnosis not present

## 2015-02-08 DIAGNOSIS — M4800 Spinal stenosis, site unspecified: Secondary | ICD-10-CM | POA: Diagnosis not present

## 2015-02-08 DIAGNOSIS — M6080 Other myositis, unspecified site: Secondary | ICD-10-CM | POA: Diagnosis not present

## 2015-02-09 DIAGNOSIS — M6080 Other myositis, unspecified site: Secondary | ICD-10-CM | POA: Diagnosis not present

## 2015-02-09 DIAGNOSIS — M4800 Spinal stenosis, site unspecified: Secondary | ICD-10-CM | POA: Diagnosis not present

## 2015-02-09 DIAGNOSIS — G5600 Carpal tunnel syndrome, unspecified upper limb: Secondary | ICD-10-CM | POA: Diagnosis not present

## 2015-02-09 DIAGNOSIS — M13851 Other specified arthritis, right hip: Secondary | ICD-10-CM | POA: Diagnosis not present

## 2015-02-13 DIAGNOSIS — M6080 Other myositis, unspecified site: Secondary | ICD-10-CM | POA: Diagnosis not present

## 2015-02-13 DIAGNOSIS — M4800 Spinal stenosis, site unspecified: Secondary | ICD-10-CM | POA: Diagnosis not present

## 2015-02-13 DIAGNOSIS — M13851 Other specified arthritis, right hip: Secondary | ICD-10-CM | POA: Diagnosis not present

## 2015-02-13 DIAGNOSIS — G5600 Carpal tunnel syndrome, unspecified upper limb: Secondary | ICD-10-CM | POA: Diagnosis not present

## 2015-02-14 DIAGNOSIS — M13851 Other specified arthritis, right hip: Secondary | ICD-10-CM | POA: Diagnosis not present

## 2015-02-14 DIAGNOSIS — G5600 Carpal tunnel syndrome, unspecified upper limb: Secondary | ICD-10-CM | POA: Diagnosis not present

## 2015-02-14 DIAGNOSIS — M4800 Spinal stenosis, site unspecified: Secondary | ICD-10-CM | POA: Diagnosis not present

## 2015-02-14 DIAGNOSIS — M6080 Other myositis, unspecified site: Secondary | ICD-10-CM | POA: Diagnosis not present

## 2015-02-15 DIAGNOSIS — M4800 Spinal stenosis, site unspecified: Secondary | ICD-10-CM | POA: Diagnosis not present

## 2015-02-15 DIAGNOSIS — M13851 Other specified arthritis, right hip: Secondary | ICD-10-CM | POA: Diagnosis not present

## 2015-02-15 DIAGNOSIS — M6080 Other myositis, unspecified site: Secondary | ICD-10-CM | POA: Diagnosis not present

## 2015-02-15 DIAGNOSIS — G5600 Carpal tunnel syndrome, unspecified upper limb: Secondary | ICD-10-CM | POA: Diagnosis not present

## 2015-02-16 DIAGNOSIS — M6080 Other myositis, unspecified site: Secondary | ICD-10-CM | POA: Diagnosis not present

## 2015-02-16 DIAGNOSIS — M13851 Other specified arthritis, right hip: Secondary | ICD-10-CM | POA: Diagnosis not present

## 2015-02-16 DIAGNOSIS — G5600 Carpal tunnel syndrome, unspecified upper limb: Secondary | ICD-10-CM | POA: Diagnosis not present

## 2015-02-16 DIAGNOSIS — M4800 Spinal stenosis, site unspecified: Secondary | ICD-10-CM | POA: Diagnosis not present

## 2015-02-20 DIAGNOSIS — M13851 Other specified arthritis, right hip: Secondary | ICD-10-CM | POA: Diagnosis not present

## 2015-02-20 DIAGNOSIS — M4800 Spinal stenosis, site unspecified: Secondary | ICD-10-CM | POA: Diagnosis not present

## 2015-02-20 DIAGNOSIS — M6080 Other myositis, unspecified site: Secondary | ICD-10-CM | POA: Diagnosis not present

## 2015-02-20 DIAGNOSIS — G5600 Carpal tunnel syndrome, unspecified upper limb: Secondary | ICD-10-CM | POA: Diagnosis not present

## 2015-02-21 ENCOUNTER — Ambulatory Visit: Payer: Commercial Managed Care - HMO

## 2015-02-21 DIAGNOSIS — G5600 Carpal tunnel syndrome, unspecified upper limb: Secondary | ICD-10-CM | POA: Diagnosis not present

## 2015-02-21 DIAGNOSIS — M13851 Other specified arthritis, right hip: Secondary | ICD-10-CM | POA: Diagnosis not present

## 2015-02-21 DIAGNOSIS — M4800 Spinal stenosis, site unspecified: Secondary | ICD-10-CM | POA: Diagnosis not present

## 2015-02-21 DIAGNOSIS — M6080 Other myositis, unspecified site: Secondary | ICD-10-CM | POA: Diagnosis not present

## 2015-02-22 DIAGNOSIS — M13851 Other specified arthritis, right hip: Secondary | ICD-10-CM | POA: Diagnosis not present

## 2015-02-22 DIAGNOSIS — G5600 Carpal tunnel syndrome, unspecified upper limb: Secondary | ICD-10-CM | POA: Diagnosis not present

## 2015-02-22 DIAGNOSIS — M6080 Other myositis, unspecified site: Secondary | ICD-10-CM | POA: Diagnosis not present

## 2015-02-22 DIAGNOSIS — M4800 Spinal stenosis, site unspecified: Secondary | ICD-10-CM | POA: Diagnosis not present

## 2015-02-23 DIAGNOSIS — G5600 Carpal tunnel syndrome, unspecified upper limb: Secondary | ICD-10-CM | POA: Diagnosis not present

## 2015-02-23 DIAGNOSIS — M6080 Other myositis, unspecified site: Secondary | ICD-10-CM | POA: Diagnosis not present

## 2015-02-23 DIAGNOSIS — M13851 Other specified arthritis, right hip: Secondary | ICD-10-CM | POA: Diagnosis not present

## 2015-02-23 DIAGNOSIS — M4800 Spinal stenosis, site unspecified: Secondary | ICD-10-CM | POA: Diagnosis not present

## 2015-02-27 DIAGNOSIS — M6080 Other myositis, unspecified site: Secondary | ICD-10-CM | POA: Diagnosis not present

## 2015-02-27 DIAGNOSIS — M4800 Spinal stenosis, site unspecified: Secondary | ICD-10-CM | POA: Diagnosis not present

## 2015-02-27 DIAGNOSIS — G5600 Carpal tunnel syndrome, unspecified upper limb: Secondary | ICD-10-CM | POA: Diagnosis not present

## 2015-02-27 DIAGNOSIS — M13851 Other specified arthritis, right hip: Secondary | ICD-10-CM | POA: Diagnosis not present

## 2015-02-28 DIAGNOSIS — G5600 Carpal tunnel syndrome, unspecified upper limb: Secondary | ICD-10-CM | POA: Diagnosis not present

## 2015-02-28 DIAGNOSIS — M6080 Other myositis, unspecified site: Secondary | ICD-10-CM | POA: Diagnosis not present

## 2015-02-28 DIAGNOSIS — M13851 Other specified arthritis, right hip: Secondary | ICD-10-CM | POA: Diagnosis not present

## 2015-02-28 DIAGNOSIS — M4800 Spinal stenosis, site unspecified: Secondary | ICD-10-CM | POA: Diagnosis not present

## 2015-03-01 DIAGNOSIS — G5600 Carpal tunnel syndrome, unspecified upper limb: Secondary | ICD-10-CM | POA: Diagnosis not present

## 2015-03-01 DIAGNOSIS — M13851 Other specified arthritis, right hip: Secondary | ICD-10-CM | POA: Diagnosis not present

## 2015-03-01 DIAGNOSIS — M6080 Other myositis, unspecified site: Secondary | ICD-10-CM | POA: Diagnosis not present

## 2015-03-01 DIAGNOSIS — M4800 Spinal stenosis, site unspecified: Secondary | ICD-10-CM | POA: Diagnosis not present

## 2015-03-02 DIAGNOSIS — M13851 Other specified arthritis, right hip: Secondary | ICD-10-CM | POA: Diagnosis not present

## 2015-03-02 DIAGNOSIS — M4800 Spinal stenosis, site unspecified: Secondary | ICD-10-CM | POA: Diagnosis not present

## 2015-03-02 DIAGNOSIS — G5600 Carpal tunnel syndrome, unspecified upper limb: Secondary | ICD-10-CM | POA: Diagnosis not present

## 2015-03-02 DIAGNOSIS — M6080 Other myositis, unspecified site: Secondary | ICD-10-CM | POA: Diagnosis not present

## 2015-03-06 ENCOUNTER — Ambulatory Visit
Admission: RE | Admit: 2015-03-06 | Discharge: 2015-03-06 | Disposition: A | Discharge status: Home / Self Care | Payer: Commercial Managed Care - HMO | Source: Ambulatory Visit | Attending: Internal Medicine | Admitting: Internal Medicine

## 2015-03-06 DIAGNOSIS — Z1231 Encounter for screening mammogram for malignant neoplasm of breast: Secondary | ICD-10-CM | POA: Diagnosis not present

## 2015-03-06 DIAGNOSIS — G5600 Carpal tunnel syndrome, unspecified upper limb: Secondary | ICD-10-CM | POA: Diagnosis not present

## 2015-03-06 DIAGNOSIS — M13851 Other specified arthritis, right hip: Secondary | ICD-10-CM | POA: Diagnosis not present

## 2015-03-06 DIAGNOSIS — M6080 Other myositis, unspecified site: Secondary | ICD-10-CM | POA: Diagnosis not present

## 2015-03-06 DIAGNOSIS — M4800 Spinal stenosis, site unspecified: Secondary | ICD-10-CM | POA: Diagnosis not present

## 2015-03-07 DIAGNOSIS — M13851 Other specified arthritis, right hip: Secondary | ICD-10-CM | POA: Diagnosis not present

## 2015-03-07 DIAGNOSIS — M6080 Other myositis, unspecified site: Secondary | ICD-10-CM | POA: Diagnosis not present

## 2015-03-07 DIAGNOSIS — M4800 Spinal stenosis, site unspecified: Secondary | ICD-10-CM | POA: Diagnosis not present

## 2015-03-07 DIAGNOSIS — R634 Abnormal weight loss: Secondary | ICD-10-CM | POA: Diagnosis not present

## 2015-03-07 DIAGNOSIS — M2550 Pain in unspecified joint: Secondary | ICD-10-CM | POA: Diagnosis not present

## 2015-03-07 DIAGNOSIS — Z87891 Personal history of nicotine dependence: Secondary | ICD-10-CM | POA: Diagnosis not present

## 2015-03-07 DIAGNOSIS — G5600 Carpal tunnel syndrome, unspecified upper limb: Secondary | ICD-10-CM | POA: Diagnosis not present

## 2015-03-07 DIAGNOSIS — E119 Type 2 diabetes mellitus without complications: Secondary | ICD-10-CM | POA: Diagnosis not present

## 2015-03-08 DIAGNOSIS — M6080 Other myositis, unspecified site: Secondary | ICD-10-CM | POA: Diagnosis not present

## 2015-03-08 DIAGNOSIS — G5600 Carpal tunnel syndrome, unspecified upper limb: Secondary | ICD-10-CM | POA: Diagnosis not present

## 2015-03-08 DIAGNOSIS — M13851 Other specified arthritis, right hip: Secondary | ICD-10-CM | POA: Diagnosis not present

## 2015-03-08 DIAGNOSIS — M4800 Spinal stenosis, site unspecified: Secondary | ICD-10-CM | POA: Diagnosis not present

## 2015-03-09 DIAGNOSIS — M6080 Other myositis, unspecified site: Secondary | ICD-10-CM | POA: Diagnosis not present

## 2015-03-09 DIAGNOSIS — M4800 Spinal stenosis, site unspecified: Secondary | ICD-10-CM | POA: Diagnosis not present

## 2015-03-09 DIAGNOSIS — M13851 Other specified arthritis, right hip: Secondary | ICD-10-CM | POA: Diagnosis not present

## 2015-03-09 DIAGNOSIS — G5600 Carpal tunnel syndrome, unspecified upper limb: Secondary | ICD-10-CM | POA: Diagnosis not present

## 2015-03-09 DIAGNOSIS — K648 Other hemorrhoids: Secondary | ICD-10-CM | POA: Diagnosis not present

## 2015-03-09 DIAGNOSIS — L98499 Non-pressure chronic ulcer of skin of other sites with unspecified severity: Secondary | ICD-10-CM | POA: Diagnosis not present

## 2015-03-13 DIAGNOSIS — G5600 Carpal tunnel syndrome, unspecified upper limb: Secondary | ICD-10-CM | POA: Diagnosis not present

## 2015-03-13 DIAGNOSIS — M13851 Other specified arthritis, right hip: Secondary | ICD-10-CM | POA: Diagnosis not present

## 2015-03-13 DIAGNOSIS — M6080 Other myositis, unspecified site: Secondary | ICD-10-CM | POA: Diagnosis not present

## 2015-03-13 DIAGNOSIS — M4800 Spinal stenosis, site unspecified: Secondary | ICD-10-CM | POA: Diagnosis not present

## 2015-03-14 DIAGNOSIS — R05 Cough: Secondary | ICD-10-CM | POA: Diagnosis not present

## 2015-03-14 DIAGNOSIS — R0982 Postnasal drip: Secondary | ICD-10-CM | POA: Diagnosis not present

## 2015-03-14 DIAGNOSIS — M6080 Other myositis, unspecified site: Secondary | ICD-10-CM | POA: Diagnosis not present

## 2015-03-14 DIAGNOSIS — G5600 Carpal tunnel syndrome, unspecified upper limb: Secondary | ICD-10-CM | POA: Diagnosis not present

## 2015-03-14 DIAGNOSIS — M4800 Spinal stenosis, site unspecified: Secondary | ICD-10-CM | POA: Diagnosis not present

## 2015-03-14 DIAGNOSIS — E0590 Thyrotoxicosis, unspecified without thyrotoxic crisis or storm: Secondary | ICD-10-CM | POA: Diagnosis not present

## 2015-03-14 DIAGNOSIS — Z87891 Personal history of nicotine dependence: Secondary | ICD-10-CM | POA: Diagnosis not present

## 2015-03-14 DIAGNOSIS — M13851 Other specified arthritis, right hip: Secondary | ICD-10-CM | POA: Diagnosis not present

## 2015-03-15 DIAGNOSIS — E0500 Thyrotoxicosis with diffuse goiter without thyrotoxic crisis or storm: Secondary | ICD-10-CM | POA: Diagnosis not present

## 2015-03-15 DIAGNOSIS — M6080 Other myositis, unspecified site: Secondary | ICD-10-CM | POA: Diagnosis not present

## 2015-03-15 DIAGNOSIS — M4800 Spinal stenosis, site unspecified: Secondary | ICD-10-CM | POA: Diagnosis not present

## 2015-03-15 DIAGNOSIS — M13851 Other specified arthritis, right hip: Secondary | ICD-10-CM | POA: Diagnosis not present

## 2015-03-15 DIAGNOSIS — G5600 Carpal tunnel syndrome, unspecified upper limb: Secondary | ICD-10-CM | POA: Diagnosis not present

## 2015-03-16 DIAGNOSIS — M6080 Other myositis, unspecified site: Secondary | ICD-10-CM | POA: Diagnosis not present

## 2015-03-16 DIAGNOSIS — G5600 Carpal tunnel syndrome, unspecified upper limb: Secondary | ICD-10-CM | POA: Diagnosis not present

## 2015-03-16 DIAGNOSIS — M13851 Other specified arthritis, right hip: Secondary | ICD-10-CM | POA: Diagnosis not present

## 2015-03-16 DIAGNOSIS — M4800 Spinal stenosis, site unspecified: Secondary | ICD-10-CM | POA: Diagnosis not present

## 2015-03-20 DIAGNOSIS — M4800 Spinal stenosis, site unspecified: Secondary | ICD-10-CM | POA: Diagnosis not present

## 2015-03-20 DIAGNOSIS — M13851 Other specified arthritis, right hip: Secondary | ICD-10-CM | POA: Diagnosis not present

## 2015-03-20 DIAGNOSIS — M6080 Other myositis, unspecified site: Secondary | ICD-10-CM | POA: Diagnosis not present

## 2015-03-20 DIAGNOSIS — G5600 Carpal tunnel syndrome, unspecified upper limb: Secondary | ICD-10-CM | POA: Diagnosis not present

## 2015-03-21 DIAGNOSIS — E119 Type 2 diabetes mellitus without complications: Secondary | ICD-10-CM | POA: Diagnosis not present

## 2015-03-21 DIAGNOSIS — N63 Unspecified lump in breast: Secondary | ICD-10-CM | POA: Diagnosis not present

## 2015-03-21 DIAGNOSIS — M13851 Other specified arthritis, right hip: Secondary | ICD-10-CM | POA: Diagnosis not present

## 2015-03-21 DIAGNOSIS — M6080 Other myositis, unspecified site: Secondary | ICD-10-CM | POA: Diagnosis not present

## 2015-03-21 DIAGNOSIS — M4800 Spinal stenosis, site unspecified: Secondary | ICD-10-CM | POA: Diagnosis not present

## 2015-03-21 DIAGNOSIS — G5600 Carpal tunnel syndrome, unspecified upper limb: Secondary | ICD-10-CM | POA: Diagnosis not present

## 2015-03-21 DIAGNOSIS — E0500 Thyrotoxicosis with diffuse goiter without thyrotoxic crisis or storm: Secondary | ICD-10-CM | POA: Diagnosis not present

## 2015-03-21 DIAGNOSIS — E780 Pure hypercholesterolemia: Secondary | ICD-10-CM | POA: Diagnosis not present

## 2015-03-22 DIAGNOSIS — M6080 Other myositis, unspecified site: Secondary | ICD-10-CM | POA: Diagnosis not present

## 2015-03-22 DIAGNOSIS — G5600 Carpal tunnel syndrome, unspecified upper limb: Secondary | ICD-10-CM | POA: Diagnosis not present

## 2015-03-22 DIAGNOSIS — M4800 Spinal stenosis, site unspecified: Secondary | ICD-10-CM | POA: Diagnosis not present

## 2015-03-22 DIAGNOSIS — M13851 Other specified arthritis, right hip: Secondary | ICD-10-CM | POA: Diagnosis not present

## 2015-03-23 DIAGNOSIS — M4800 Spinal stenosis, site unspecified: Secondary | ICD-10-CM | POA: Diagnosis not present

## 2015-03-23 DIAGNOSIS — M13851 Other specified arthritis, right hip: Secondary | ICD-10-CM | POA: Diagnosis not present

## 2015-03-23 DIAGNOSIS — M6080 Other myositis, unspecified site: Secondary | ICD-10-CM | POA: Diagnosis not present

## 2015-03-23 DIAGNOSIS — G5600 Carpal tunnel syndrome, unspecified upper limb: Secondary | ICD-10-CM | POA: Diagnosis not present

## 2015-03-27 DIAGNOSIS — M4800 Spinal stenosis, site unspecified: Secondary | ICD-10-CM | POA: Diagnosis not present

## 2015-03-27 DIAGNOSIS — M6080 Other myositis, unspecified site: Secondary | ICD-10-CM | POA: Diagnosis not present

## 2015-03-27 DIAGNOSIS — G5600 Carpal tunnel syndrome, unspecified upper limb: Secondary | ICD-10-CM | POA: Diagnosis not present

## 2015-03-27 DIAGNOSIS — M13851 Other specified arthritis, right hip: Secondary | ICD-10-CM | POA: Diagnosis not present

## 2015-03-28 ENCOUNTER — Other Ambulatory Visit: Payer: Self-pay | Admitting: Internal Medicine

## 2015-03-28 DIAGNOSIS — N63 Unspecified lump in unspecified breast: Secondary | ICD-10-CM

## 2015-04-06 DIAGNOSIS — M791 Myalgia: Secondary | ICD-10-CM | POA: Diagnosis not present

## 2015-04-06 DIAGNOSIS — M25562 Pain in left knee: Secondary | ICD-10-CM | POA: Diagnosis not present

## 2015-04-06 DIAGNOSIS — M25561 Pain in right knee: Secondary | ICD-10-CM | POA: Diagnosis not present

## 2015-04-06 DIAGNOSIS — M150 Primary generalized (osteo)arthritis: Secondary | ICD-10-CM | POA: Diagnosis not present

## 2015-04-06 DIAGNOSIS — G8929 Other chronic pain: Secondary | ICD-10-CM | POA: Diagnosis not present

## 2015-04-20 DIAGNOSIS — E0500 Thyrotoxicosis with diffuse goiter without thyrotoxic crisis or storm: Secondary | ICD-10-CM | POA: Diagnosis not present

## 2015-04-26 DIAGNOSIS — Z01419 Encounter for gynecological examination (general) (routine) without abnormal findings: Secondary | ICD-10-CM | POA: Diagnosis not present

## 2015-04-26 DIAGNOSIS — R21 Rash and other nonspecific skin eruption: Secondary | ICD-10-CM | POA: Diagnosis not present

## 2015-04-26 DIAGNOSIS — L280 Lichen simplex chronicus: Secondary | ICD-10-CM | POA: Diagnosis not present

## 2015-05-02 DIAGNOSIS — E042 Nontoxic multinodular goiter: Secondary | ICD-10-CM | POA: Diagnosis not present

## 2015-05-02 DIAGNOSIS — E0500 Thyrotoxicosis with diffuse goiter without thyrotoxic crisis or storm: Secondary | ICD-10-CM | POA: Diagnosis not present

## 2015-05-08 DIAGNOSIS — E041 Nontoxic single thyroid nodule: Secondary | ICD-10-CM | POA: Diagnosis not present

## 2015-05-08 DIAGNOSIS — E042 Nontoxic multinodular goiter: Secondary | ICD-10-CM | POA: Diagnosis not present

## 2015-05-17 ENCOUNTER — Other Ambulatory Visit: Payer: Commercial Managed Care - HMO

## 2015-05-17 ENCOUNTER — Ambulatory Visit: Payer: Commercial Managed Care - HMO

## 2015-05-19 ENCOUNTER — Ambulatory Visit
Admission: RE | Admit: 2015-05-19 | Discharge: 2015-05-19 | Disposition: A | Discharge status: Home / Self Care | Payer: Commercial Managed Care - HMO | Source: Ambulatory Visit | Attending: Internal Medicine | Admitting: Internal Medicine

## 2015-05-19 DIAGNOSIS — N63 Unspecified lump in unspecified breast: Secondary | ICD-10-CM

## 2015-05-22 DIAGNOSIS — M25561 Pain in right knee: Secondary | ICD-10-CM | POA: Diagnosis not present

## 2015-05-22 DIAGNOSIS — M65331 Trigger finger, right middle finger: Secondary | ICD-10-CM | POA: Diagnosis not present

## 2015-05-22 DIAGNOSIS — G8929 Other chronic pain: Secondary | ICD-10-CM | POA: Diagnosis not present

## 2015-05-22 DIAGNOSIS — M150 Primary generalized (osteo)arthritis: Secondary | ICD-10-CM | POA: Diagnosis not present

## 2015-06-07 ENCOUNTER — Ambulatory Visit: Payer: Self-pay | Admitting: Anesthesiology

## 2015-06-14 ENCOUNTER — Ambulatory Visit: Payer: Self-pay | Admitting: Anesthesiology

## 2015-06-16 DIAGNOSIS — E119 Type 2 diabetes mellitus without complications: Secondary | ICD-10-CM | POA: Diagnosis not present

## 2015-06-16 DIAGNOSIS — E7800 Pure hypercholesterolemia, unspecified: Secondary | ICD-10-CM | POA: Diagnosis not present

## 2015-06-21 ENCOUNTER — Ambulatory Visit: Payer: Self-pay | Admitting: Anesthesiology

## 2015-06-21 DIAGNOSIS — Z0000 Encounter for general adult medical examination without abnormal findings: Secondary | ICD-10-CM | POA: Insufficient documentation

## 2015-06-21 DIAGNOSIS — E119 Type 2 diabetes mellitus without complications: Secondary | ICD-10-CM | POA: Diagnosis not present

## 2015-06-21 DIAGNOSIS — Z6835 Body mass index (BMI) 35.0-35.9, adult: Secondary | ICD-10-CM | POA: Diagnosis not present

## 2015-06-21 DIAGNOSIS — I700 Atherosclerosis of aorta: Secondary | ICD-10-CM | POA: Diagnosis not present

## 2015-06-21 DIAGNOSIS — M25551 Pain in right hip: Secondary | ICD-10-CM | POA: Diagnosis not present

## 2015-06-21 DIAGNOSIS — M79675 Pain in left toe(s): Secondary | ICD-10-CM | POA: Diagnosis not present

## 2015-06-21 DIAGNOSIS — Z23 Encounter for immunization: Secondary | ICD-10-CM | POA: Diagnosis not present

## 2015-06-21 DIAGNOSIS — E7800 Pure hypercholesterolemia, unspecified: Secondary | ICD-10-CM | POA: Diagnosis not present

## 2015-06-23 DIAGNOSIS — E0500 Thyrotoxicosis with diffuse goiter without thyrotoxic crisis or storm: Secondary | ICD-10-CM | POA: Diagnosis not present

## 2015-06-23 DIAGNOSIS — E042 Nontoxic multinodular goiter: Secondary | ICD-10-CM | POA: Diagnosis not present

## 2015-06-28 ENCOUNTER — Ambulatory Visit: Payer: Commercial Managed Care - HMO | Attending: Anesthesiology | Admitting: Anesthesiology

## 2015-06-28 ENCOUNTER — Encounter: Payer: Self-pay | Admitting: Anesthesiology

## 2015-06-28 ENCOUNTER — Other Ambulatory Visit: Payer: Self-pay | Admitting: Anesthesiology

## 2015-06-28 VITALS — BP 107/66 | HR 18 | Temp 96.4°F | Resp 96 | Ht 71.0 in | Wt 235.0 lb

## 2015-06-28 DIAGNOSIS — E669 Obesity, unspecified: Secondary | ICD-10-CM | POA: Insufficient documentation

## 2015-06-28 DIAGNOSIS — I10 Essential (primary) hypertension: Secondary | ICD-10-CM | POA: Insufficient documentation

## 2015-06-28 DIAGNOSIS — M797 Fibromyalgia: Secondary | ICD-10-CM | POA: Insufficient documentation

## 2015-06-28 DIAGNOSIS — F1120 Opioid dependence, uncomplicated: Secondary | ICD-10-CM | POA: Diagnosis not present

## 2015-06-28 DIAGNOSIS — M25561 Pain in right knee: Secondary | ICD-10-CM

## 2015-06-28 DIAGNOSIS — M25562 Pain in left knee: Secondary | ICD-10-CM | POA: Diagnosis not present

## 2015-06-28 DIAGNOSIS — G894 Chronic pain syndrome: Secondary | ICD-10-CM | POA: Diagnosis not present

## 2015-06-28 DIAGNOSIS — M545 Low back pain, unspecified: Secondary | ICD-10-CM

## 2015-06-28 DIAGNOSIS — Z79891 Long term (current) use of opiate analgesic: Secondary | ICD-10-CM | POA: Diagnosis not present

## 2015-06-28 DIAGNOSIS — M170 Bilateral primary osteoarthritis of knee: Secondary | ICD-10-CM

## 2015-06-28 DIAGNOSIS — Z96651 Presence of right artificial knee joint: Secondary | ICD-10-CM | POA: Diagnosis not present

## 2015-06-28 DIAGNOSIS — Z96641 Presence of right artificial hip joint: Secondary | ICD-10-CM | POA: Diagnosis not present

## 2015-06-28 DIAGNOSIS — M5136 Other intervertebral disc degeneration, lumbar region: Secondary | ICD-10-CM | POA: Diagnosis not present

## 2015-06-28 DIAGNOSIS — F17200 Nicotine dependence, unspecified, uncomplicated: Secondary | ICD-10-CM | POA: Insufficient documentation

## 2015-06-28 DIAGNOSIS — Z9889 Other specified postprocedural states: Secondary | ICD-10-CM | POA: Diagnosis not present

## 2015-06-28 DIAGNOSIS — G8929 Other chronic pain: Secondary | ICD-10-CM | POA: Diagnosis not present

## 2015-06-28 DIAGNOSIS — Z79899 Other long term (current) drug therapy: Secondary | ICD-10-CM | POA: Diagnosis not present

## 2015-06-28 MED ORDER — MELOXICAM 7.5 MG PO TABS: 7.5000 mg | ORAL_TABLET | Freq: Two times a day (BID) | ORAL | Status: DC

## 2015-06-28 NOTE — Patient Instructions (Signed)
Lateral Femoral Cutaneous Nerve Block Patient Information  Description: The lateral femoral cutaneous nerve of the thigh is a purely sensory nerve that can become entrapped or irritated for a number of reasons.  The pain associated with this condition is called meralgia paraesthetica.  Patients affected with this syndrome have burning pain or abnormal sensation along the lateral aspect of the thigh.  The pain can be worsened by prolonged walking, standing, or constrictive garments around the house.   The diagnosis can be confirmed and treatment initiated by blocking the nerve with local anesthetic (like Novocaine).  At times, a steroid solution may be injected at the same time.  The site of injection is through a tiny needle in the left, lower quadrant of the abdomen.   The entire block usually lasts less than 5 minutes.  Conditions which may be treated by lateral femoral cutaneous nerve block:   Meralagia paraesthetica  Preparation for the injection:  1. Do not eat any solid food or dairy products within 6 hours of your appointment.  2. You may drink clear liquids up to 2 hours before appointment.  Clear liquids include water, black coffee, juice or soda. No milk or cream please. 3. You may take your regular medication, including pain medications, with a sip of water before your appointment.  Diabetics should hold regular insulin (if taken separately) and take 1/2 normal NPH dose the morning of the procedure.  Carry some sugar containing items with you to your appointment. 4. A driver must accompany you and be prepared to drive you home after your procedure. 5. Bring all you current medications with you 6. An IV may be inserted and sedation may be given at the discretion of the physician. 7. A blood pressure cuff, EKG and other monitors will often be applied during the procedure.  Some patients may need to have extra oxygen administered for a short period. 8. You will be asked to provide medical  information, including your allergies and medications, prior to the procedure.  We must know immediately if you are taking blood thinners (like Coumadin/Warfarin) or if you allergic to IV iodine contrast (dye)  We must know if you could possible be pregnant.   Possible side-effects:   Bleeding from needle site  Infection (rate, may require surgery)  Nerve injury (rare)  Numbness and Tingling (temporary)  Light-headedness (temporary)  Pain at injection site (several day)  Decreased blood pressure (rare, temporary)  Weakness in leg (temporary)  Call if you experience:  Hives or difficulty breathing (go to the emergency room)  Inflammation or drainage at the injection site(s)  Please note:  Although the local anesthetic injected can often make your leg feel good for several hours after the injection,  The pain may return.  It takes 3-7 days for steroids to work.  You may not notice any pain relief for at least one week.  If effective, we will often do a series of injections spaced 3-6 weeks apart to maximally decrease your pain.  If you have any questions, please cll [phone] [facility]   

## 2015-06-28 NOTE — Progress Notes (Signed)
Subjective:    Patient ID: Christine Higgins, female    DOB: 1956/08/02, 59 y.o.   MRN: [id number] Patient is a pleasant 59 year old lady who comes in complaining of pain in both knees She indicates that her pain has been present since 1987 when she was diagnosed with osteoarthritis at that time She has been treated with Celebrex 200 mg twice a day but this dose has been decreased currently at 200 mg twice a day She's also had symptoms this injections into the knee along with cortisone injections and physical therapy and aqua therapy; none of those treatments have given her any sustained relief Patient indicates that she was overweight but recently she lost 60 pounds and this has not seemed to make too much of a difference to her pain Infectious said she now has significant pain between her right knee and her right hip which was decided which she had a right hip total replacement a few years ago The patient describes pain as being sharp burning achy stabbing and is always constant  Subjective pain intensity rating Subjective pain intensity rating at the present time is 70% but with activity it rises to 100% especially on mornings Patient indicates that the pain is relieved by Tylenol arthritis and bed rest and ice several Pain is aggravated by activity and prolonged sitting  Pain medications Pain medications include Celebrex 100 mg which he takes twice a day and also Tylenol arthritis  Other medications Her medications include triamterene losartan metoprolol Flexeril Cymbalta trazodone and Klonopin Lipitor; these medicines she takes every day some others which she takes sporadically  Allergies Patient is allergic to aspirin and ibuprofen  Past medical history Medical history is positive for hypertension and fibromyalgia and lumbar degenerative disc disease  Past surgical history Past surgical history is positive for bilateral carpal tunnel surgery 1 cesarean section and excision of prose  from her right shoulder. Also had lumbar spine surgery and implants in her cervical area of her neck she's also had a right total hip replacement  Social and economic history Patient smokes currently about half pack of cigarettes a day and she's been smoking that way since she was 49. She  indicated that she used to smoke a smoker as many as 2 packs of cigarettes a day when she used to drink a lot of alcohol Generally she has discontinued smoking in 1994 so she is currently a former smoker She considered herself an alcoholic because she drank a great deal but she discontinued using alcohol since 1992 Patient indicates that she has used excessive amount so illegal drugs. He indicates that she was allergic to crack cocaine from 1985 through [place] history Asian in single and lives with her daughter who is age 75 She is currently on SSI Her mother is deceased from age 16 following complications of a tumor in her back Father is deceased from age 57 from the complications of: Genital heart failure She has 4 brothers all of whom are alive and well  She has 2 sisters both of whom are alive and well   HPI    Review of Systems  Constitutional: Negative.  Negative for fever, chills, diaphoresis, activity change, appetite change, fatigue and unexpected weight change.  HENT: Negative for congestion, dental problem, drooling, ear discharge, ear pain, facial swelling, hearing loss, mouth sores, nosebleeds and postnasal drip.   Eyes: Negative.  Negative for photophobia, pain, discharge, redness, itching and visual disturbance.  Respiratory: Negative.  Negative for apnea,  cough, choking, chest tightness, shortness of breath, wheezing and stridor.   Cardiovascular: Negative.  Negative for chest pain, palpitations and leg swelling.  Gastrointestinal: Negative.  Negative for nausea, vomiting, abdominal pain, diarrhea, constipation, blood in stool, abdominal distention, anal bleeding and rectal pain.   Endocrine: Negative.  Negative for cold intolerance, heat intolerance, polydipsia, polyphagia and polyuria.  Genitourinary: Negative.   Musculoskeletal: Positive for myalgias, back pain, joint swelling, arthralgias and gait problem. Negative for neck pain.  Allergic/Immunologic: Negative.  Negative for environmental allergies, food allergies and immunocompromised state.  Neurological: Negative.  Negative for dizziness, tremors, seizures, syncope, facial asymmetry, speech difficulty, weakness, light-headedness, numbness and headaches.  Hematological: Negative.  Negative for adenopathy. Does not bruise/bleed easily.  Psychiatric/Behavioral: Positive for agitation. Negative for hallucinations, behavioral problems, confusion, self-injury, dysphoric mood and decreased concentration. The patient is not nervous/anxious and is not hyperactive.        Objective:   Physical Exam  Constitutional: She is oriented to person, place, and time. She appears well-developed and well-nourished. No distress.  HENT:  Head: Normocephalic and atraumatic.  Right Ear: External ear normal.  Nose: Nose normal.  Mouth/Throat: Oropharynx is clear and moist. No oropharyngeal exudate.  Eyes: Conjunctivae and EOM are normal. Pupils are equal, round, and reactive to light. Right eye exhibits no discharge. Left eye exhibits no discharge. No scleral icterus.  Neck: Normal range of motion. Neck supple. No tracheal deviation present. No thyromegaly present.  Cardiovascular: Normal rate, regular rhythm, normal heart sounds and intact distal pulses.  Exam reveals no gallop and no friction rub.   No murmur heard. Pulmonary/Chest: Effort normal and breath sounds normal. No stridor. No respiratory distress. She has no wheezes. She has no rales. She exhibits no tenderness.  Abdominal: Soft. Bowel sounds are normal. She exhibits no distension and no mass. There is no tenderness. There is no rebound and no guarding.  Genitourinary:   Genitourinary examination was deferred  Musculoskeletal: She exhibits tenderness. She exhibits no edema.  Torsion test was normal Leg raising tests were normal The patient had great difficulty with flexion of both knees and the right was almost impossible to flex Was significant deformities and swelling of both knees again the right was much worse than the left There was significant tenderness of both knees  Lymphadenopathy:    She has no cervical adenopathy.  Neurological: She is alert and oriented to person, place, and time. She displays normal reflexes. No cranial nerve deficit. She exhibits normal muscle tone. Coordination normal.  Skin: Skin is warm and dry. No rash noted. She is not diaphoretic. No erythema. No pallor.  Psychiatric: She has a normal mood and affect. Her behavior is normal. Judgment and thought content normal.  Nursing note and vitals reviewed.  Her blood pressure is 107/66 mmHg Temperature is 96.65F  Her weight is 235 pounds  Her pulse is 18 beats per minutes Equal and regular Respirations are 16 breaths per minute SPO2 was 100%         Assessment & Plan:   Assessment 1 chronic pain in both knees 2 osteoarthritis of both knees with the right being worse than the left 3 obesity 4 status post right total hip replacement 5 chronic low back pain 6 lumbar degenerative disc disease   Plan of management 1 Will plan a right femoral nerve block next week 2 we'll follow that with a left femoral nerve block if patient gets relief from the right  3  will plan a caudal  epidural steroid injection 4 Will discontinue Celebrex 5 Will begin the patient on meloxicam 7.5 mg twice a day and give HER-2 week supply   New patient    level [place].D.

## 2015-07-06 LAB — TOXASSURE SELECT 13 (MW), URINE: PDF: 0

## 2015-07-28 ENCOUNTER — Encounter: Payer: Self-pay | Admitting: Anesthesiology

## 2015-07-28 ENCOUNTER — Ambulatory Visit: Payer: Commercial Managed Care - HMO | Attending: Anesthesiology | Admitting: Anesthesiology

## 2015-07-28 VITALS — BP 124/50 | HR 80 | Temp 98.4°F | Resp 18 | Ht 71.0 in | Wt 233.0 lb

## 2015-07-28 DIAGNOSIS — M25562 Pain in left knee: Secondary | ICD-10-CM | POA: Insufficient documentation

## 2015-07-28 DIAGNOSIS — M25561 Pain in right knee: Secondary | ICD-10-CM | POA: Insufficient documentation

## 2015-07-28 DIAGNOSIS — M170 Bilateral primary osteoarthritis of knee: Secondary | ICD-10-CM | POA: Diagnosis not present

## 2015-07-28 DIAGNOSIS — G8929 Other chronic pain: Secondary | ICD-10-CM | POA: Diagnosis not present

## 2015-07-28 DIAGNOSIS — M79606 Pain in leg, unspecified: Secondary | ICD-10-CM | POA: Insufficient documentation

## 2015-07-28 DIAGNOSIS — Z96651 Presence of right artificial knee joint: Secondary | ICD-10-CM | POA: Diagnosis not present

## 2015-07-28 MED ORDER — TRIAMCINOLONE ACETONIDE 40 MG/ML IJ SUSP
INTRAMUSCULAR | Status: AC
  Administered 2015-07-28: 10:00:00
  Filled 2015-07-28: qty 1

## 2015-07-28 MED ORDER — BUPIVACAINE HCL (PF) 0.5 % IJ SOLN
INTRAMUSCULAR | Status: AC
  Administered 2015-07-28: 10:00:00
  Filled 2015-07-28: qty 30

## 2015-07-28 NOTE — Progress Notes (Signed)
Safety precautions to be maintained throughout the outpatient stay will include: orient to surroundings, keep bed in low position, maintain call bell within reach at all times, provide assistance with transfer out of bed and ambulation.  

## 2015-07-28 NOTE — Progress Notes (Signed)
   Subjective:    Patient ID: Christine Higgins, female    DOB: 26-Jul-1956, 59 y.o.   MRN: [id number]  HPI    Review of Systems     Objective:   Physical Exam        Assessment & Plan:

## 2015-07-28 NOTE — Procedures (Signed)
Date of procedure: 07/28/2015  Preoperative Diagnosis:  1 right knee pain 2 primary osteoarthritis of the right knee  Postoperative Diagnosis: Same.  Procedure: 1. right Femoral Nerve Block Surgeon: Lance Bosch, MD  Anesthesia: Very light intravenous sedation without opioids, administered by the nurse and staff under my direction  Informed consent was obtained and the patient appeared to accept and understand the benefits and risks of this procedure.   Pre procedure comments:  None  Description of the Procedure:  The patient was taken to the operating room and placed in the prone position.  The right groin and the rightmedial aspect of the upper thigh was prepped with Betadine.  right Femoral Nerve Block: After appropriate draping, the right femoral artery was palpated.   Then with the hand on the right femoral artery, a 1-inch, 22-gauge needle was inserted immediately lateral to the palpated right femoral artery and inserted perpendicularly into the groin just below the inguinal canal.  Paresthesia was elicited going down into the right thigh. Aspirations were negative for blood and other body fluids.   Then 10 cc of 0.5% Bupivacaine was injected.   The needle was removed and adequate hemostasis was established.   Patient tolerated the procedures quite well.   Vital signs were stable.  There were no adverse effects.   The patient was taken to the recover room in satisfactory condition where patient was observed and subsequently discharged.   We will follow up with the patient in one week.    Lance Bosch M.D.

## 2015-07-28 NOTE — Patient Instructions (Signed)
Trigger Point Injection Trigger points are areas where you have muscle pain. A trigger point injection is a shot given in the trigger point to relieve that pain. A trigger point might feel like a knot in your muscle. It hurts to press on a trigger point. Sometimes the pain spreads out (radiates) to other parts of the body. For example, pressing on a trigger point in your shoulder might cause pain in your arm or neck. You might have one trigger point. Or, you might have more than one. People often have trigger points in their upper back and lower back. They also occur often in the neck and shoulders. Pain from a trigger point lasts for a long time. It can make it hard to keep moving. You might not be able to do the exercise or physical therapy that could help you deal with the pain. A trigger point injection may help. It does not work for everyone. But, it may relieve your pain for a few days or a few months. A trigger point injection does not cure long-lasting (chronic) pain. LET YOUR CAREGIVER KNOW ABOUT:  Any allergies (especially to latex, lidocaine, or steroids).  Blood-thinning medicines that you take. These drugs can lead to bleeding or bruising after an injection. They include:  Aspirin.  Ibuprofen.  Clopidogrel.  Warfarin.  Other medicines you take. This includes all vitamins, herbs, eyedrops, over-the-counter medicines, and creams.  Use of steroids.  Recent infections.  Past problems with numbing medicines.  Bleeding problems.  Surgeries you have had.  Other health problems. RISKS AND COMPLICATIONS A trigger point injection is a safe treatment. However, problems may develop, such as:  Minor side effects usually go away in 1 to 2 days. These may include:  Soreness.  Bruising.  Stiffness.  More serious problems are rare. But, they may include:  Bleeding under the skin (hematoma).  Skin infection.  Breaking off of the needle under your skin.  Lung  puncture.  The trigger point injection may not work for you. BEFORE THE PROCEDURE You may need to stop taking any medicine that thins your blood. This is to prevent bleeding and bruising. Usually these medicines are stopped several days before the injection. No other preparation is needed. PROCEDURE  A trigger point injection can be given in your caregiver's office or in a clinic. Each injection takes 2 minutes or less.  Your caregiver will feel for trigger points. The caregiver may use a marker to circle the area for the injection.  The skin over the trigger point will be washed with a germ-killing (antiseptic) solution.  The caregiver pinches the spot for the injection.  Then, a very thin needle is used for the shot. You may feel pain or a twitching feeling when the needle enters the trigger point.  A numbing solution may be injected into the trigger point. Sometimes a drug to keep down swelling, redness, and warmth (inflammation) is also injected.  Your caregiver moves the needle around the trigger zone until the tightness and twitching goes away.  After the injection, your caregiver may put gentle pressure over the injection site.  Then it is covered with a bandage. AFTER THE PROCEDURE  You can go right home after the injection.  The bandage can be taken off after a few hours.  You may feel sore and stiff for 1 to 2 days.  Go back to your regular activities slowly. Your caregiver may ask you to stretch your muscles. Do not do anything that takes   extra energy for a few days.  Follow your caregiver's instructions to manage and treat other pain.   This information is not intended to replace advice given to you by your health care provider. Make sure you discuss any questions you have with your health care provider.   Document Released: 08/01/2011 Document Revised: 12/07/2012 Document Reviewed: 08/01/2011 Else[employer] Interactive Patient Education 2016 [employer] Pain  Management Discharge Instructions  General Discharge Instructions :  If you need to reach your doctor call: Monday-Friday 8:00 am - 4:00 pm at [phone] or toll free [phone].  After clinic hours [phone] to have operator reach doctor.  Bring all of your medication bottles to all your appointments in the pain clinic.  To cancel or reschedule your appointment with Pain Management please remember to call 24 hours in advance to avoid a fee.  Refer to the educational materials which you have been given on: General Risks, I had my Procedure. Discharge Instructions, Post Sedation.  Post Procedure Instructions:  The drugs you were given will stay in your system until tomorrow, so for the next 24 hours you should not drive, make any legal decisions or drink any alcoholic beverages.  You may eat anything you prefer, but it is better to start with liquids then soups and crackers, and gradually work up to solid foods.  Please notify your doctor immediately if you have any unusual bleeding, trouble breathing or pain that is not related to your normal pain.  Depending on the type of procedure that was done, some parts of your body may feel week and/or numb.  This usually clears up by tonight or the next day.  Walk with the use of an assistive device or accompanied by an adult for the 24 hours.  You may use ice on the affected area for the first 24 hours.  Put ice in a Ziploc bag and cover with a towel and place against area 15 minutes on 15 minutes off.  You may switch to heat after 24 hours.GENERAL RISKS AND COMPLICATIONS  What are the risk, side effects and possible complications? Generally speaking, most procedures are safe.  However, with any procedure there are risks, side effects, and the possibility of complications.  The risks and complications are dependent upon the sites that are lesioned, or the type of nerve block to be performed.  The closer the procedure is to the spine, the  more serious the risks are.  Great care is taken when placing the radio frequency needles, block needles or lesioning probes, but sometimes complications can occur.  Infection: Any time there is an injection through the skin, there is a risk of infection.  This is why sterile conditions are used for these blocks.  There are four possible types of infection.  Localized skin infection.  Central Nervous System Infection-This can be in the form of Meningitis, which can be deadly.  Epidural Infections-This can be in the form of an epidural abscess, which can cause pressure inside of the spine, causing compression of the spinal cord with subsequent paralysis. This would require an emergency surgery to decompress, and there are no guarantees that the patient would recover from the paralysis.  Discitis-This is an infection of the intervertebral discs.  It occurs in about 1% of discography procedures.  It is difficult to treat and it may lead to surgery.        2. Pain: the needles have to go through skin and soft tissues, will cause soreness.  3. Damage to internal structures:  The nerves to be lesioned may be near blood vessels or    other nerves which can be potentially damaged.       4. Bleeding: Bleeding is more common if the patient is taking blood thinners such as  aspirin, Coumadin, Ticiid, Plavix, etc., or if he/she have some genetic predisposition  such as hemophilia. Bleeding into the spinal canal can cause compression of the spinal  cord with subsequent paralysis.  This would require an emergency surgery to  decompress and there are no guarantees that the patient would recover from the  paralysis.       5. Pneumothorax:  Puncturing of a lung is a possibility, every time a needle is introduced in  the area of the chest or upper back.  Pneumothorax refers to free air around the  collapsed lung(s), inside of the thoracic cavity (chest cavity).  Another two possible  complications related to a  similar event would include: Hemothorax and Chylothorax.   These are variations of the Pneumothorax, where instead of air around the collapsed  lung(s), you may have blood or chyle, respectively.       6. Spinal headaches: They may occur with any procedures in the area of the spine.       7. Persistent CSF (Cerebro-Spinal Fluid) leakage: This is a rare problem, but may occur  with prolonged intrathecal or epidural catheters either due to the formation of a fistulous  track or a dural tear.       8. Nerve damage: By working so close to the spinal cord, there is always a possibility of  nerve damage, which could be as serious as a permanent spinal cord injury with  paralysis.       9. Death:  Although rare, severe deadly allergic reactions known as "Anaphylactic  reaction" can occur to any of the medications used.      10. Worsening of the symptoms:  We can always make thing worse.  What are the chances of something like this happening? Chances of any of this occuring are extremely low.  By statistics, you have more of a chance of getting killed in a motor vehicle accident: while driving to the hospital than any of the above occurring .  Nevertheless, you should be aware that they are possibilities.  In general, it is similar to taking a shower.  Everybody knows that you can slip, hit your head and get killed.  Does that mean that you should not shower again?  Nevertheless always keep in mind that statistics do not mean anything if you happen to be on the wrong side of them.  Even if a procedure has a 1 (one) in a 1,000,000 (million) chance of going wrong, it you happen to be that one..Also, keep in mind that by statistics, you have more of a chance of having something go wrong when taking medications.  Who should not have this procedure? If you are on a blood thinning medication (e.g. Coumadin, Plavix, see list of "Blood Thinners"), or if you have an active infection going on, you should not have the  procedure.  If you are taking any blood thinners, please inform your physician.  How should I prepare for this procedure?  Do not eat or drink anything at least six hours prior to the procedure.  Bring a driver with you .  It cannot be a taxi.  Come accompanied by an adult that can drive you back, and  that is strong enough to help you if your legs get weak or numb from the local anesthetic.  Take all of your medicines the morning of the procedure with just enough water to swallow them.  If you have diabetes, make sure that you are scheduled to have your procedure done first thing in the morning, whenever possible.  If you have diabetes, take only half of your insulin dose and notify our nurse that you have done so as soon as you arrive at the clinic.  If you are diabetic, but only take blood sugar pills (oral hypoglycemic), then do not take them on the morning of your procedure.  You may take them after you have had the procedure.  Do not take aspirin or any aspirin-containing medications, at least eleven (11) days prior to the procedure.  They may prolong bleeding.  Wear loose fitting clothing that may be easy to take off and that you would not mind if it got stained with Betadine or blood.  Do not wear any jewelry or perfume  Remove any nail coloring.  It will interfere with some of our monitoring equipment.  NOTE: Remember that this is not meant to be interpreted as a complete list of all possible complications.  Unforeseen problems may occur.  BLOOD THINNERS The following drugs contain aspirin or other products, which can cause increased bleeding during surgery and should not be taken for 2 weeks prior to and 1 week after surgery.  If you should need take something for relief of minor pain, you may take acetaminophen which is found in Tylenol,m Datril, Anacin-3 and Panadol. It is not blood thinner. The products listed below are.  Do not take any of the products listed below in  addition to any listed on your instruction sheet.  A.P.C or A.P.C with Codeine Codeine Phosphate Capsules #3 Ibuprofen Ridaura  ABC compound Congesprin Imuran rimadil  Advil Cope Indocin Robaxisal  Alka-Seltzer Effervescent Pain Reliever and Antacid Coricidin or Coricidin-D  Indomethacin Rufen  Alka-Seltzer plus Cold Medicine Cosprin Ketoprofen S-A-C Tablets  Anacin Analgesic Tablets or Capsules Coumadin Korlgesic Salflex  Anacin Extra Strength Analgesic tablets or capsules CP-2 Tablets Lanoril Salicylate  Anaprox Cuprimine Capsules Levenox Salocol  Anexsia-D Dalteparin Magan Salsalate  Anodynos Darvon compound Magnesium Salicylate Sine-off  Ansaid Dasin Capsules Magsal Sodium Salicylate  Anturane Depen Capsules Marnal Soma  APF Arthritis pain formula Dewitt's Pills Measurin Stanback  Argesic Dia-Gesic Meclofenamic Sulfinpyrazone  Arthritis Bayer Timed Release Aspirin Diclofenac Meclomen Sulindac  Arthritis pain formula Anacin Dicumarol Medipren Supac  Analgesic (Safety coated) Arthralgen Diffunasal Mefanamic Suprofen  Arthritis Strength Bufferin Dihydrocodeine Mepro Compound Suprol  Arthropan liquid Dopirydamole Methcarbomol with Aspirin Synalgos  ASA tablets/Enseals Disalcid Micrainin Tagament  Ascriptin Doan's Midol Talwin  Ascriptin A/D Dolene Mobidin Tanderil  Ascriptin Extra Strength Dolobid Moblgesic Ticlid  Ascriptin with Codeine Doloprin or Doloprin with Codeine Momentum Tolectin  Asperbuf Duoprin Mono-gesic Trendar  Aspergum Duradyne Motrin or Motrin IB Triminicin  Aspirin plain, buffered or enteric coated Durasal Myochrisine Trigesic  Aspirin Suppositories Easprin Nalfon Trillsate  Aspirin with Codeine Ecotrin Regular or Extra Strength Naprosyn Uracel  Atromid-S Efficin Naproxen Ursinus  Auranofin Capsules Elmiron Neocylate Vanquish  Axotal Emagrin Norgesic Verin  Azathioprine Empirin or Empirin with Codeine Normiflo Vitamin E  Azolid Emprazil Nuprin Voltaren  Bayer  Aspirin plain, buffered or children's or timed BC Tablets or powders Encaprin Orgaran Warfarin Sodium  Buff-a-Comp Enoxaparin Orudis Zorpin  Buff-a-Comp with Codeine Equegesic Os-Cal-Gesic   Buffaprin Excedrin plain, buffered  or Extra Strength Oxalid   Bufferin Arthritis Strength Feldene Oxphenbutazone   Bufferin plain or Extra Strength Feldene Capsules Oxycodone with Aspirin   Bufferin with Codeine Fenoprofen Fenoprofen Pabalate or Pabalate-SF   Buffets II Flogesic Panagesic   Buffinol plain or Extra Strength Florinal or Florinal with Codeine Panwarfarin   Buf-Tabs Flurbiprofen Penicillamine   Butalbital Compound Four-way cold tablets Penicillin   Butazolidin Fragmin Pepto-Bismol   Carbenicillin Geminisyn Percodan   Carna Arthritis Reliever Geopen Persantine   Carprofen Gold's salt Persistin   Chloramphenicol Goody's Phenylbutazone   Chloromycetin Haltrain Piroxlcam   Clmetidine heparin Plaquenil   Cllnoril Hyco-pap Ponstel   Clofibrate Hydroxy chloroquine Propoxyphen         Before stopping any of these medications, be sure to consult the physician who ordered them.  Some, such as Coumadin (Warfarin) are ordered to prevent or treat serious conditions such as "deep thrombosis", "pumonary embolisms", and other heart problems.  The amount of time that you may need off of the medication may also vary with the medication and the reason for which you were taking it.  If you are taking any of these medications, please make sure you notify your pain physician before you undergo any procedures.

## 2015-07-31 ENCOUNTER — Telehealth: Payer: Self-pay | Admitting: *Deleted

## 2015-07-31 NOTE — Telephone Encounter (Signed)
Patient does not have a working telephone number.

## 2015-08-01 ENCOUNTER — Other Ambulatory Visit: Payer: Self-pay | Admitting: Anesthesiology

## 2015-08-07 ENCOUNTER — Ambulatory Visit: Payer: Self-pay | Admitting: Anesthesiology

## 2015-08-10 DIAGNOSIS — H11153 Pinguecula, bilateral: Secondary | ICD-10-CM | POA: Diagnosis not present

## 2015-08-10 DIAGNOSIS — H04123 Dry eye syndrome of bilateral lacrimal glands: Secondary | ICD-10-CM | POA: Diagnosis not present

## 2015-08-10 DIAGNOSIS — H25813 Combined forms of age-related cataract, bilateral: Secondary | ICD-10-CM | POA: Diagnosis not present

## 2015-08-15 DIAGNOSIS — M25512 Pain in left shoulder: Secondary | ICD-10-CM | POA: Diagnosis not present

## 2015-08-15 DIAGNOSIS — G8929 Other chronic pain: Secondary | ICD-10-CM | POA: Diagnosis not present

## 2015-08-15 DIAGNOSIS — M7542 Impingement syndrome of left shoulder: Secondary | ICD-10-CM | POA: Diagnosis not present

## 2015-08-15 DIAGNOSIS — Z96641 Presence of right artificial hip joint: Secondary | ICD-10-CM | POA: Diagnosis not present

## 2015-08-17 ENCOUNTER — Other Ambulatory Visit: Payer: Self-pay | Admitting: Orthopedic Surgery

## 2015-08-17 DIAGNOSIS — M25512 Pain in left shoulder: Principal | ICD-10-CM

## 2015-08-17 DIAGNOSIS — G8929 Other chronic pain: Secondary | ICD-10-CM

## 2015-08-21 DIAGNOSIS — Z96641 Presence of right artificial hip joint: Secondary | ICD-10-CM | POA: Insufficient documentation

## 2015-09-05 ENCOUNTER — Ambulatory Visit
Admission: RE | Admit: 2015-09-05 | Discharge: 2015-09-05 | Disposition: A | Discharge status: Home / Self Care | Payer: Medicare HMO | Source: Ambulatory Visit | Attending: Orthopedic Surgery | Admitting: Orthopedic Surgery

## 2015-09-05 DIAGNOSIS — S46812A Strain of other muscles, fascia and tendons at shoulder and upper arm level, left arm, initial encounter: Secondary | ICD-10-CM | POA: Diagnosis not present

## 2015-09-05 DIAGNOSIS — M659 Synovitis and tenosynovitis, unspecified: Secondary | ICD-10-CM | POA: Insufficient documentation

## 2015-09-05 DIAGNOSIS — M25512 Pain in left shoulder: Secondary | ICD-10-CM | POA: Diagnosis not present

## 2015-09-05 DIAGNOSIS — M19012 Primary osteoarthritis, left shoulder: Secondary | ICD-10-CM | POA: Diagnosis not present

## 2015-09-05 DIAGNOSIS — G8929 Other chronic pain: Secondary | ICD-10-CM | POA: Diagnosis not present

## 2015-09-05 DIAGNOSIS — M25412 Effusion, left shoulder: Secondary | ICD-10-CM | POA: Insufficient documentation

## 2015-09-05 DIAGNOSIS — S46112A Strain of muscle, fascia and tendon of long head of biceps, left arm, initial encounter: Secondary | ICD-10-CM | POA: Diagnosis not present

## 2015-09-05 DIAGNOSIS — M7552 Bursitis of left shoulder: Secondary | ICD-10-CM | POA: Insufficient documentation

## 2015-09-06 ENCOUNTER — Ambulatory Visit: Payer: Medicare HMO | Admitting: Anesthesiology

## 2015-09-06 ENCOUNTER — Telehealth: Payer: Self-pay | Admitting: Anesthesiology

## 2015-09-06 NOTE — Telephone Encounter (Signed)
Would like to speak with Dr. Idelia Salm re: Dr. Marry Guan suggestion that the pain in her knee is coming from her back / please call patient to discuss / she is going to have dr. Marry Guan to send notes over

## 2015-09-06 NOTE — Telephone Encounter (Signed)
Attempted to call patient, no answer 

## 2015-09-07 ENCOUNTER — Ambulatory Visit: Payer: Self-pay | Admitting: Anesthesiology

## 2015-09-21 DIAGNOSIS — M7501 Adhesive capsulitis of right shoulder: Secondary | ICD-10-CM | POA: Diagnosis not present

## 2015-09-26 ENCOUNTER — Ambulatory Visit: Payer: Self-pay | Admitting: Physical Therapy

## 2015-09-26 DIAGNOSIS — E042 Nontoxic multinodular goiter: Secondary | ICD-10-CM | POA: Diagnosis not present

## 2015-09-26 DIAGNOSIS — E0500 Thyrotoxicosis with diffuse goiter without thyrotoxic crisis or storm: Secondary | ICD-10-CM | POA: Diagnosis not present

## 2015-10-03 ENCOUNTER — Ambulatory Visit: Payer: Self-pay | Admitting: Anesthesiology

## 2015-10-04 ENCOUNTER — Ambulatory Visit: Payer: Self-pay | Admitting: Anesthesiology

## 2015-10-06 ENCOUNTER — Ambulatory Visit: Payer: Medicare HMO | Attending: Anesthesiology | Admitting: Anesthesiology

## 2015-10-06 ENCOUNTER — Ambulatory Visit: Payer: Self-pay | Admitting: Anesthesiology

## 2015-10-06 ENCOUNTER — Encounter: Payer: Self-pay | Admitting: Anesthesiology

## 2015-10-06 VITALS — BP 152/78 | HR 82 | Temp 98.2°F | Resp 16 | Ht 71.0 in | Wt 239.0 lb

## 2015-10-06 DIAGNOSIS — M25561 Pain in right knee: Secondary | ICD-10-CM | POA: Diagnosis present

## 2015-10-06 DIAGNOSIS — G8929 Other chronic pain: Secondary | ICD-10-CM | POA: Diagnosis not present

## 2015-10-06 DIAGNOSIS — M25562 Pain in left knee: Secondary | ICD-10-CM | POA: Diagnosis present

## 2015-10-06 DIAGNOSIS — M170 Bilateral primary osteoarthritis of knee: Secondary | ICD-10-CM | POA: Diagnosis not present

## 2015-10-06 DIAGNOSIS — M545 Low back pain: Secondary | ICD-10-CM | POA: Diagnosis not present

## 2015-10-06 DIAGNOSIS — M5116 Intervertebral disc disorders with radiculopathy, lumbar region: Secondary | ICD-10-CM | POA: Insufficient documentation

## 2015-10-06 DIAGNOSIS — M5137 Other intervertebral disc degeneration, lumbosacral region: Secondary | ICD-10-CM | POA: Diagnosis not present

## 2015-10-06 MED ORDER — BUPIVACAINE HCL (PF) 0.25 % IJ SOLN: 20.0000 mL | Freq: Once | INTRAMUSCULAR | Status: DC

## 2015-10-06 MED ORDER — IOHEXOL 180 MG/ML  SOLN: 20.0000 mL | INTRAMUSCULAR | Status: DC | PRN

## 2015-10-06 MED ORDER — MIDAZOLAM HCL 5 MG/5ML IJ SOLN: 1.0000 mg | INTRAMUSCULAR | Status: DC

## 2015-10-06 MED ORDER — MELOXICAM 7.5 MG PO TABS: 7.5000 mg | ORAL_TABLET | Freq: Two times a day (BID) | ORAL | Status: DC

## 2015-10-06 MED ORDER — LIDOCAINE HCL (PF) 1 % IJ SOLN
INTRAMUSCULAR | Status: AC
  Filled 2015-10-06: qty 5

## 2015-10-06 MED ORDER — TRIAMCINOLONE ACETONIDE 40 MG/ML IJ SUSP
INTRAMUSCULAR | Status: AC
  Administered 2015-10-06: 11:00:00
  Filled 2015-10-06: qty 1

## 2015-10-06 MED ORDER — BUPIVACAINE HCL (PF) 0.25 % IJ SOLN
INTRAMUSCULAR | Status: AC
  Administered 2015-10-06: 11:00:00
  Filled 2015-10-06: qty 30

## 2015-10-06 MED ORDER — TRIAMCINOLONE ACETONIDE 40 MG/ML IJ SUSP: 80.0000 mg | Freq: Once | INTRAMUSCULAR | Status: DC

## 2015-10-06 MED ORDER — FENTANYL CITRATE (PF) 100 MCG/2ML IJ SOLN: 50.0000 ug | INTRAMUSCULAR | Status: DC

## 2015-10-06 MED ORDER — IOHEXOL 180 MG/ML  SOLN
INTRAMUSCULAR | Status: AC
  Administered 2015-10-06: 11:00:00
  Filled 2015-10-06: qty 20

## 2015-10-06 NOTE — Addendum Note (Signed)
Addended by: Lance Bosch on: 10/06/2015 12:09 PM   Modules accepted: Orders

## 2015-10-06 NOTE — Procedures (Signed)
Date of procedure: 10/06/2015  Preoperative Diagnosis:  1 chronic low back pain 2 lumbar degenerative disc disease 3 lumbar radiculopathy  Postoperative Diagnosis:  Same.  Procedure: 1. Caudal epidural steroid injection, 2. Epidural with interpretation. 3. Fluoroscopic guidance.  Surgeon: Lance Bosch, MD  Anesthesia: MAC anesthesia by the nurse and staff under my direction  Informed consent was obtained and the patient appeared to accept and understand the benefits and risks of this procedure.   Pre procedure comments:  None  Description of the Procedure:  The patient was taken to the operating room and placed in the prone position.   Intravenous sedation and MAC anesthesia was administered by the nurse and staff under my direction. After appropriate sedation, the sacrococcygeal area was prepped with Betadine.   After adequate draping, the area between the sacral cornu was palpated and infiltrated with 3 cc of 1% Lidocaine.   An AP fluoroscopic view of the sacrum was visualized and a 17 gauge Tuohy needle was inserted in the midline at the angle of 45 degrees through the sacrococcygeal membrane.   After making contact with the bone, the needle was withdrawn and readvanced in horizontal position, into the caudal epidural space.  Epidurogram Study: One cc of Omnipaque 180 was injected through the needle and epidurogram was visualized in both the later and AP view  As the dye was injected into the Tuohy needle was observed to spread cephalad. The spread was observed to the level of L4 and L5 and S1 and the spread was bilateral although the predominant spread was on the right side.   Comments:   No catheter was used   Caudal Epidural Steroid Injection:  Then 10 cc of 0.25% Bupivacaine and 25 mg of Kenalog were injected into the Caudal epidural space.  The needle was removed and adequate hemostasis was established.    The patient tolerated the procedure quite well and  vital signs were stable.   There were no adverse effects.  Additional comments:   This procedure was done under fluoroscopic guidance Fluoroscopic time was 0.1 minutes Number fluoroscopic frames were 2 MGYwas 4.2  The patient was taken to the recovery room in satisfactory condition where the patient was observed and subsequently discharged home.   Will follow up in the clinic in the next month.   Lance Bosch M.D.

## 2015-10-06 NOTE — Patient Instructions (Signed)

## 2015-10-06 NOTE — Progress Notes (Signed)
Safety precautions to be maintained throughout the outpatient stay will include: orient to surroundings, keep bed in low position, maintain call bell within reach at all times, provide assistance with transfer out of bed and ambulation.  

## 2015-10-18 DIAGNOSIS — E042 Nontoxic multinodular goiter: Secondary | ICD-10-CM | POA: Diagnosis not present

## 2015-10-18 DIAGNOSIS — E0500 Thyrotoxicosis with diffuse goiter without thyrotoxic crisis or storm: Secondary | ICD-10-CM | POA: Diagnosis not present

## 2015-10-30 ENCOUNTER — Ambulatory Visit: Payer: Medicare HMO | Attending: Anesthesiology | Admitting: Anesthesiology

## 2015-10-30 ENCOUNTER — Encounter: Payer: Self-pay | Admitting: Anesthesiology

## 2015-10-30 VITALS — BP 112/63 | HR 89 | Temp 97.7°F | Resp 14 | Ht 71.0 in | Wt 238.0 lb

## 2015-10-30 DIAGNOSIS — M5137 Other intervertebral disc degeneration, lumbosacral region: Secondary | ICD-10-CM | POA: Diagnosis not present

## 2015-10-30 DIAGNOSIS — M170 Bilateral primary osteoarthritis of knee: Secondary | ICD-10-CM

## 2015-10-30 DIAGNOSIS — M549 Dorsalgia, unspecified: Secondary | ICD-10-CM | POA: Insufficient documentation

## 2015-10-30 DIAGNOSIS — G8929 Other chronic pain: Secondary | ICD-10-CM | POA: Diagnosis not present

## 2015-10-30 DIAGNOSIS — M25561 Pain in right knee: Secondary | ICD-10-CM

## 2015-10-30 DIAGNOSIS — M545 Low back pain, unspecified: Secondary | ICD-10-CM

## 2015-10-30 DIAGNOSIS — M51379 Other intervertebral disc degeneration, lumbosacral region without mention of lumbar back pain or lower extremity pain: Secondary | ICD-10-CM | POA: Insufficient documentation

## 2015-10-30 DIAGNOSIS — M961 Postlaminectomy syndrome, not elsewhere classified: Secondary | ICD-10-CM | POA: Insufficient documentation

## 2015-10-30 DIAGNOSIS — M25562 Pain in left knee: Secondary | ICD-10-CM

## 2015-10-30 MED ORDER — IOHEXOL 180 MG/ML  SOLN
20.0000 mL | INTRAMUSCULAR | Status: DC | PRN
Start: 2015-10-30 — End: 2017-01-29

## 2015-10-30 MED ORDER — LIDOCAINE HCL (PF) 1 % IJ SOLN
INTRAMUSCULAR | Status: AC
  Administered 2015-10-30: 12:00:00
  Filled 2015-10-30: qty 5

## 2015-10-30 MED ORDER — TRIAMCINOLONE ACETONIDE 40 MG/ML IJ SUSP
INTRAMUSCULAR | Status: AC
  Administered 2015-10-30: 12:00:00
  Filled 2015-10-30: qty 1

## 2015-10-30 MED ORDER — MIDAZOLAM HCL 5 MG/5ML IJ SOLN: 1.0000 mg | INTRAMUSCULAR | Status: DC

## 2015-10-30 MED ORDER — FENTANYL CITRATE (PF) 100 MCG/2ML IJ SOLN: 50.0000 ug | INTRAMUSCULAR | Status: DC

## 2015-10-30 MED ORDER — TRIAMCINOLONE ACETONIDE 40 MG/ML IJ SUSP: 80.0000 mg | Freq: Once | INTRAMUSCULAR | Status: DC

## 2015-10-30 MED ORDER — IOHEXOL 180 MG/ML  SOLN
INTRAMUSCULAR | Status: AC
  Administered 2015-10-30: 12:00:00
  Filled 2015-10-30: qty 20

## 2015-10-30 MED ORDER — MIDAZOLAM HCL 5 MG/5ML IJ SOLN
INTRAMUSCULAR | Status: AC
  Filled 2015-10-30: qty 5

## 2015-10-30 MED ORDER — BUPIVACAINE HCL (PF) 0.25 % IJ SOLN: 20.0000 mL | Freq: Once | INTRAMUSCULAR | Status: DC

## 2015-10-30 MED ORDER — BUPIVACAINE HCL (PF) 0.25 % IJ SOLN
INTRAMUSCULAR | Status: AC
  Administered 2015-10-30: 12:00:00
  Filled 2015-10-30: qty 30

## 2015-10-30 MED ORDER — FENTANYL CITRATE (PF) 100 MCG/2ML IJ SOLN
INTRAMUSCULAR | Status: AC
  Filled 2015-10-30: qty 2

## 2015-10-30 MED ORDER — TRIAMCINOLONE ACETONIDE 40 MG/ML IJ SUSP
INTRAMUSCULAR | Status: AC
  Filled 2015-10-30: qty 1

## 2015-10-30 NOTE — Procedures (Signed)
Date of procedure 10/30/2015  Preoperative Diagnosis:  1 chronic low back pain 2 lumbar degenerative disc disease  Postoperative Diagnosis:  Same.  Procedure: 1. Caudal epidural steroid injection, 2. Epidural with interpretation. 3. Fluoroscopic guidance.  Surgeon: Lance Bosch, MD  Anesthesia: MAC anesthesia by the nursing staff under my direction  Informed consent was obtained and the patient appeared to accept and understand the benefits and risks of this procedure.   Pre procedure comments:  None  Description of the Procedure:  The patient was taken to the operating room and placed in the prone position.   Intravenous sedation and MAC anesthesia was administered by the nursing staff. After appropriate sedation, the sacrococcygeal area was prepped with Betadine.   After adequate draping, the area between the sacral cornu was palpated and infiltrated with 3 cc of 1% Lidocaine.   An AP fluoroscopic view of the sacrum was visualized and a 17 gauge Tuohy needle was inserted in the midline at the angle of 45 degrees through the sacrococcygeal membrane.   After making contact with the bone, the needle was withdrawn and readvanced in horizontal position, into the caudal epidural space.  Epidurogram Study:  One cc of Omnipaque 180 was injected through the needle and epidurogram was visualized in both the later and AP views.  Comments:   This procedure was done using fluoroscopic control The fluoroscope Coppock parameters were: Fluoroscopic time 0.1 minutes Number of fluoroscopic frames were 2 MG Y was 3.6 No catheter was used   Caudal Epidural Steroid Injection:  Then 10 cc of 0.25% Bupivacaine and 80 mg of Kenalog were injected into the Caudal epidural space.   The needle was removed and adequate hemostasis was established.    The patient tolerated the procedure quite well and vital signs were stable.   There were no adverse effects.  Additional comments:    Nione  The patient was taken to the recovery room in satisfactory condition where the patient was observed and subsequently discharged home.   Will follow up in the clinic in the next month    [email or web address].

## 2015-10-30 NOTE — Patient Instructions (Signed)
Epidural Steroid Injection Patient Information  Description: The epidural space surrounds the nerves as they exit the spinal cord.  In some patients, the nerves can be compressed and inflamed by a bulging disc or a tight spinal canal (spinal stenosis).  By injecting steroids into the epidural space, we can bring irritated nerves into direct contact with a potentially helpful medication.  These steroids act directly on the irritated nerves and can reduce swelling and inflammation which often leads to decreased pain.  Epidural steroids may be injected anywhere along the spine and from the neck to the low back depending upon the location of your pain.   After numbing the skin with local anesthetic (like Novocaine), a small needle is passed into the epidural space slowly.  You may experience a sensation of pressure while this is being done.  The entire block usually last less than 10 minutes.  Conditions which may be treated by epidural steroids:   Low back and leg pain  Neck and arm pain  Spinal stenosis  Post-laminectomy syndrome  Herpes zoster (shingles) pain  Pain from compression fractures  Preparation for the injection:  1. Do not eat any solid food or dairy products within 8 hours of your appointment.  2. You may drink clear liquids up to 3 hours before appointment.  Clear liquids include water, black coffee, juice or soda.  No milk or cream please. 3. You may take your regular medication, including pain medications, with a sip of water before your appointment  Diabetics should hold regular insulin (if taken separately) and take 1/2 normal NPH dos the morning of the procedure.  Carry some sugar containing items with you to your appointment. 4. A driver must accompany you and be prepared to drive you home after your procedure.  5. Bring all your current medications with your. 6. An IV may be inserted and sedation may be given at the discretion of the physician.   7. A blood pressure  cuff, EKG and other monitors will often be applied during the procedure.  Some patients may need to have extra oxygen administered for a short period. 8. You will be asked to provide medical information, including your allergies, prior to the procedure.  We must know immediately if you are taking blood thinners (like Coumadin/Warfarin)  Or if you are allergic to IV iodine contrast (dye). We must know if you could possible be pregnant.  Possible side-effects:  Bleeding from needle site  Infection (rare, may require surgery)  Nerve injury (rare)  Numbness & tingling (temporary)  Difficulty urinating (rare, temporary)  Spinal headache ( a headache worse with upright posture)  Light -headedness (temporary)  Pain at injection site (several days)  Decreased blood pressure (temporary)  Weakness in arm/leg (temporary)  Pressure sensation in back/neck (temporary)  Call if you experience:  Fever/chills associated with headache or increased back/neck pain.  Headache worsened by an upright position.  New onset weakness or numbness of an extremity below the injection site  Hives or difficulty breathing (go to the emergency room)  Inflammation or drainage at the infection site  Severe back/neck pain  Any new symptoms which are concerning to you  Please note:  Although the local anesthetic injected can often make your back or neck feel good for several hours after the injection, the pain will likely return.  It takes 3-7 days for steroids to work in the epidural space.  You may not notice any pain relief for at least that one week.    If effective, we will often do a series of three injections spaced 3-6 weeks apart to maximally decrease your pain.  After the initial series, we generally will wait several months before considering a repeat injection of the same type.  If you have any questions, please call [phone]  [facility]  Discharge Instructions  General Discharge Instructions :  If you need to reach your doctor call: Monday-Friday 8:00 am - 4:00 pm at [phone] or toll free [phone].  After clinic hours [phone] to have operator reach doctor.  Bring all of your medication bottles to all your appointments in the pain clinic.  To cancel or reschedule your appointment with Pain Management please remember to call 24 hours in advance to avoid a fee.  Refer to the educational materials which you have been given on: General Risks, I had my Procedure. Discharge Instructions, Post Sedation.  Post Procedure Instructions:  The drugs you were given will stay in your system until tomorrow, so for the next 24 hours you should not drive, make any legal decisions or drink any alcoholic beverages.  You may eat anything you prefer, but it is better to start with liquids then soups and crackers, and gradually work up to solid foods.  Please notify your doctor immediately if you have any unusual bleeding, trouble breathing or pain that is not related to your normal pain.  Depending on the type of procedure that was done, some parts of your body may feel week and/or numb.  This usually clears up by tonight or the next day.  Walk with the use of an assistive device or accompanied by an adult for the 24 hours.  You may use ice on the affected area for the first 24 hours.  Put ice in a Ziploc bag and cover with a towel and place against area 15 minutes on 15 minutes off.  You may switch to heat after 24 hours. 

## 2015-10-30 NOTE — Progress Notes (Signed)
   Subjective:    Patient ID: Christine Higgins, female    DOB: December 13, 1955, 60 y.o.   MRN: [id number]  HPI  Patient returned to the clinic today indicating that she got very dramatic pain relief from the last procedure which was a caudal epidural steroid injection Her back pain was almost nonexistent for 8 days but that the pain is gradually coming back Today her subjective pain intensity rating is 50% During the 8 days after the procedure it was between 5 and 10% She indicates that she is also getting adequate relief from the meloxicam Therefore plan to repeat her caudal epidural steroid injection for her today   Lance Bosch M.D.  Review of Systems     Objective:   Physical Exam        Assessment & Plan:

## 2015-10-30 NOTE — Progress Notes (Signed)
Safety precautions to be maintained throughout the outpatient stay will include: orient to surroundings, keep bed in low position, maintain call bell within reach at all times, provide assistance with transfer out of bed and ambulation.  

## 2015-11-22 DIAGNOSIS — E7800 Pure hypercholesterolemia, unspecified: Secondary | ICD-10-CM | POA: Diagnosis not present

## 2015-11-22 DIAGNOSIS — I700 Atherosclerosis of aorta: Secondary | ICD-10-CM | POA: Diagnosis not present

## 2015-11-22 DIAGNOSIS — B370 Candidal stomatitis: Secondary | ICD-10-CM | POA: Diagnosis not present

## 2015-11-22 DIAGNOSIS — E119 Type 2 diabetes mellitus without complications: Secondary | ICD-10-CM | POA: Diagnosis not present

## 2015-11-22 DIAGNOSIS — Z6835 Body mass index (BMI) 35.0-35.9, adult: Secondary | ICD-10-CM | POA: Diagnosis not present

## 2015-11-28 ENCOUNTER — Ambulatory Visit: Payer: Self-pay | Admitting: Anesthesiology

## 2015-12-13 DIAGNOSIS — E0500 Thyrotoxicosis with diffuse goiter without thyrotoxic crisis or storm: Secondary | ICD-10-CM | POA: Diagnosis not present

## 2015-12-13 DIAGNOSIS — E042 Nontoxic multinodular goiter: Secondary | ICD-10-CM | POA: Diagnosis not present

## 2015-12-18 DIAGNOSIS — E0500 Thyrotoxicosis with diffuse goiter without thyrotoxic crisis or storm: Secondary | ICD-10-CM | POA: Diagnosis not present

## 2015-12-18 DIAGNOSIS — E042 Nontoxic multinodular goiter: Secondary | ICD-10-CM | POA: Diagnosis not present

## 2015-12-21 DIAGNOSIS — I700 Atherosclerosis of aorta: Secondary | ICD-10-CM | POA: Diagnosis not present

## 2015-12-21 DIAGNOSIS — E119 Type 2 diabetes mellitus without complications: Secondary | ICD-10-CM | POA: Diagnosis not present

## 2015-12-21 DIAGNOSIS — F325 Major depressive disorder, single episode, in full remission: Secondary | ICD-10-CM | POA: Diagnosis not present

## 2015-12-21 DIAGNOSIS — Z6835 Body mass index (BMI) 35.0-35.9, adult: Secondary | ICD-10-CM | POA: Diagnosis not present

## 2015-12-21 DIAGNOSIS — R1084 Generalized abdominal pain: Secondary | ICD-10-CM | POA: Diagnosis not present

## 2015-12-21 DIAGNOSIS — E7800 Pure hypercholesterolemia, unspecified: Secondary | ICD-10-CM | POA: Diagnosis not present

## 2016-01-10 ENCOUNTER — Other Ambulatory Visit (HOSPITAL_COMMUNITY): Payer: Self-pay | Admitting: Diagnostic Radiology

## 2016-01-10 ENCOUNTER — Other Ambulatory Visit: Payer: Self-pay | Admitting: Internal Medicine

## 2016-01-10 DIAGNOSIS — R222 Localized swelling, mass and lump, trunk: Secondary | ICD-10-CM

## 2016-02-05 ENCOUNTER — Ambulatory Visit
Admission: RE | Admit: 2016-02-05 | Discharge: 2016-02-05 | Disposition: A | Discharge status: Home / Self Care | Payer: Commercial Managed Care - HMO | Source: Ambulatory Visit | Attending: Internal Medicine | Admitting: Internal Medicine

## 2016-02-05 DIAGNOSIS — N63 Unspecified lump in breast: Secondary | ICD-10-CM | POA: Diagnosis not present

## 2016-02-05 DIAGNOSIS — R222 Localized swelling, mass and lump, trunk: Secondary | ICD-10-CM | POA: Diagnosis not present

## 2016-02-07 ENCOUNTER — Other Ambulatory Visit: Payer: Self-pay | Admitting: Internal Medicine

## 2016-02-07 DIAGNOSIS — R928 Other abnormal and inconclusive findings on diagnostic imaging of breast: Secondary | ICD-10-CM

## 2016-02-08 DIAGNOSIS — K2960 Other gastritis without bleeding: Secondary | ICD-10-CM | POA: Diagnosis not present

## 2016-02-08 DIAGNOSIS — R1033 Periumbilical pain: Secondary | ICD-10-CM | POA: Diagnosis not present

## 2016-02-13 DIAGNOSIS — M5416 Radiculopathy, lumbar region: Secondary | ICD-10-CM | POA: Diagnosis not present

## 2016-02-13 DIAGNOSIS — G8929 Other chronic pain: Secondary | ICD-10-CM | POA: Insufficient documentation

## 2016-02-13 DIAGNOSIS — M545 Low back pain, unspecified: Secondary | ICD-10-CM | POA: Insufficient documentation

## 2016-02-13 DIAGNOSIS — Z6835 Body mass index (BMI) 35.0-35.9, adult: Secondary | ICD-10-CM | POA: Diagnosis not present

## 2016-02-19 ENCOUNTER — Other Ambulatory Visit (HOSPITAL_COMMUNITY): Payer: Self-pay | Admitting: Neurosurgery

## 2016-02-19 DIAGNOSIS — M5416 Radiculopathy, lumbar region: Secondary | ICD-10-CM

## 2016-02-23 ENCOUNTER — Ambulatory Visit
Admission: RE | Admit: 2016-02-23 | Discharge: 2016-02-23 | Disposition: A | Discharge status: Home / Self Care | Payer: Commercial Managed Care - HMO | Source: Ambulatory Visit | Attending: Internal Medicine | Admitting: Internal Medicine

## 2016-02-23 DIAGNOSIS — N6081 Other benign mammary dysplasias of right breast: Secondary | ICD-10-CM | POA: Insufficient documentation

## 2016-02-23 DIAGNOSIS — R928 Other abnormal and inconclusive findings on diagnostic imaging of breast: Secondary | ICD-10-CM | POA: Diagnosis present

## 2016-02-23 DIAGNOSIS — N6011 Diffuse cystic mastopathy of right breast: Secondary | ICD-10-CM | POA: Diagnosis not present

## 2016-03-05 DIAGNOSIS — M25562 Pain in left knee: Secondary | ICD-10-CM | POA: Diagnosis not present

## 2016-03-05 DIAGNOSIS — G8929 Other chronic pain: Secondary | ICD-10-CM | POA: Diagnosis not present

## 2016-03-05 DIAGNOSIS — M25561 Pain in right knee: Secondary | ICD-10-CM | POA: Diagnosis not present

## 2016-03-05 DIAGNOSIS — M170 Bilateral primary osteoarthritis of knee: Secondary | ICD-10-CM | POA: Diagnosis not present

## 2016-03-13 ENCOUNTER — Ambulatory Visit
Admission: RE | Admit: 2016-03-13 | Discharge: 2016-03-13 | Disposition: A | Discharge status: Home / Self Care | Payer: Commercial Managed Care - HMO | Source: Ambulatory Visit | Attending: Neurosurgery | Admitting: Neurosurgery

## 2016-03-13 DIAGNOSIS — M5116 Intervertebral disc disorders with radiculopathy, lumbar region: Secondary | ICD-10-CM | POA: Diagnosis not present

## 2016-03-13 DIAGNOSIS — M47896 Other spondylosis, lumbar region: Secondary | ICD-10-CM | POA: Insufficient documentation

## 2016-03-13 DIAGNOSIS — M4806 Spinal stenosis, lumbar region: Secondary | ICD-10-CM | POA: Insufficient documentation

## 2016-03-13 DIAGNOSIS — M5416 Radiculopathy, lumbar region: Secondary | ICD-10-CM | POA: Insufficient documentation

## 2016-03-13 DIAGNOSIS — M5124 Other intervertebral disc displacement, thoracic region: Secondary | ICD-10-CM | POA: Insufficient documentation

## 2016-03-13 LAB — POCT I-STAT CREATININE: Creatinine, Ser: 0.6 mg/dL (ref 0.44–1.00)

## 2016-03-13 MED ORDER — GADOBENATE DIMEGLUMINE 529 MG/ML IV SOLN
20.0000 mL | Freq: Once | INTRAVENOUS | Status: AC | PRN
  Administered 2016-03-13: 20 mL via INTRAVENOUS

## 2016-03-18 ENCOUNTER — Other Ambulatory Visit: Payer: Self-pay | Admitting: Gastroenterology

## 2016-03-18 DIAGNOSIS — R1084 Generalized abdominal pain: Secondary | ICD-10-CM

## 2016-03-19 ENCOUNTER — Other Ambulatory Visit: Payer: Self-pay | Admitting: Gastroenterology

## 2016-03-19 ENCOUNTER — Other Ambulatory Visit
Admission: RE | Admit: 2016-03-19 | Discharge: 2016-03-19 | Disposition: A | Discharge status: Home / Self Care | Payer: Commercial Managed Care - HMO | Source: Ambulatory Visit | Attending: Internal Medicine | Admitting: Internal Medicine

## 2016-03-19 DIAGNOSIS — R079 Chest pain, unspecified: Secondary | ICD-10-CM | POA: Insufficient documentation

## 2016-03-19 DIAGNOSIS — R1084 Generalized abdominal pain: Secondary | ICD-10-CM | POA: Diagnosis not present

## 2016-03-19 DIAGNOSIS — R0789 Other chest pain: Secondary | ICD-10-CM

## 2016-03-19 LAB — TROPONIN I: Troponin I: <0.03 ng/mL (ref <0.03)

## 2016-03-20 ENCOUNTER — Other Ambulatory Visit: Payer: Self-pay | Admitting: Internal Medicine

## 2016-03-20 ENCOUNTER — Ambulatory Visit
Admission: RE | Admit: 2016-03-20 | Discharge: 2016-03-20 | Disposition: A | Discharge status: Home / Self Care | Payer: Commercial Managed Care - HMO | Source: Ambulatory Visit | Attending: Internal Medicine | Admitting: Internal Medicine

## 2016-03-20 DIAGNOSIS — R079 Chest pain, unspecified: Secondary | ICD-10-CM | POA: Diagnosis not present

## 2016-03-20 DIAGNOSIS — R7989 Other specified abnormal findings of blood chemistry: Secondary | ICD-10-CM | POA: Diagnosis not present

## 2016-03-20 DIAGNOSIS — I2510 Atherosclerotic heart disease of native coronary artery without angina pectoris: Secondary | ICD-10-CM | POA: Diagnosis not present

## 2016-03-20 MED ORDER — IOPAMIDOL (ISOVUE-370) INJECTION 76%
75.0000 mL | Freq: Once | INTRAVENOUS | Status: AC | PRN
Start: 2016-03-20 — End: 2016-03-20
  Administered 2016-03-20: 75 mL via INTRAVENOUS

## 2016-03-22 DIAGNOSIS — M5136 Other intervertebral disc degeneration, lumbar region: Secondary | ICD-10-CM | POA: Diagnosis not present

## 2016-03-22 DIAGNOSIS — M545 Low back pain: Secondary | ICD-10-CM | POA: Diagnosis not present

## 2016-03-22 DIAGNOSIS — M5416 Radiculopathy, lumbar region: Secondary | ICD-10-CM | POA: Diagnosis not present

## 2016-03-22 DIAGNOSIS — M4806 Spinal stenosis, lumbar region: Secondary | ICD-10-CM | POA: Diagnosis not present

## 2016-03-22 DIAGNOSIS — M48061 Spinal stenosis, lumbar region without neurogenic claudication: Secondary | ICD-10-CM | POA: Insufficient documentation

## 2016-03-26 ENCOUNTER — Ambulatory Visit: Payer: Commercial Managed Care - HMO

## 2016-03-26 ENCOUNTER — Ambulatory Visit
Admission: RE | Admit: 2016-03-26 | Discharge: 2016-03-26 | Disposition: A | Discharge status: Home / Self Care | Payer: Commercial Managed Care - HMO | Source: Ambulatory Visit | Attending: Gastroenterology | Admitting: Gastroenterology

## 2016-03-26 DIAGNOSIS — R0789 Other chest pain: Secondary | ICD-10-CM

## 2016-03-26 DIAGNOSIS — K429 Umbilical hernia without obstruction or gangrene: Secondary | ICD-10-CM | POA: Diagnosis not present

## 2016-03-26 DIAGNOSIS — E042 Nontoxic multinodular goiter: Secondary | ICD-10-CM | POA: Diagnosis not present

## 2016-03-26 DIAGNOSIS — R1084 Generalized abdominal pain: Secondary | ICD-10-CM

## 2016-03-26 DIAGNOSIS — E0500 Thyrotoxicosis with diffuse goiter without thyrotoxic crisis or storm: Secondary | ICD-10-CM | POA: Diagnosis not present

## 2016-03-26 MED ORDER — IOPAMIDOL (ISOVUE-300) INJECTION 61%
100.0000 mL | Freq: Once | INTRAVENOUS | Status: AC | PRN
  Administered 2016-03-26: 100 mL via INTRAVENOUS

## 2016-03-27 ENCOUNTER — Ambulatory Visit: Payer: Commercial Managed Care - HMO

## 2016-03-30 DIAGNOSIS — M79604 Pain in right leg: Secondary | ICD-10-CM | POA: Diagnosis not present

## 2016-03-30 DIAGNOSIS — M25551 Pain in right hip: Secondary | ICD-10-CM | POA: Diagnosis not present

## 2016-03-30 DIAGNOSIS — M79651 Pain in right thigh: Secondary | ICD-10-CM | POA: Diagnosis not present

## 2016-04-10 ENCOUNTER — Other Ambulatory Visit: Payer: Self-pay | Admitting: Gastroenterology

## 2016-04-10 DIAGNOSIS — I700 Atherosclerosis of aorta: Secondary | ICD-10-CM

## 2016-04-10 DIAGNOSIS — R1084 Generalized abdominal pain: Secondary | ICD-10-CM

## 2016-04-12 DIAGNOSIS — E042 Nontoxic multinodular goiter: Secondary | ICD-10-CM | POA: Diagnosis not present

## 2016-04-12 DIAGNOSIS — E0500 Thyrotoxicosis with diffuse goiter without thyrotoxic crisis or storm: Secondary | ICD-10-CM | POA: Diagnosis not present

## 2016-04-12 DIAGNOSIS — M5412 Radiculopathy, cervical region: Secondary | ICD-10-CM | POA: Diagnosis not present

## 2016-04-12 DIAGNOSIS — M542 Cervicalgia: Secondary | ICD-10-CM | POA: Diagnosis not present

## 2016-04-12 DIAGNOSIS — S161XXA Strain of muscle, fascia and tendon at neck level, initial encounter: Secondary | ICD-10-CM | POA: Diagnosis not present

## 2016-04-12 DIAGNOSIS — M4322 Fusion of spine, cervical region: Secondary | ICD-10-CM | POA: Diagnosis not present

## 2016-04-14 ENCOUNTER — Emergency Department
Admission: EM | Admit: 2016-04-14 | Discharge: 2016-04-14 | Disposition: A | Discharge status: Home / Self Care | Payer: Commercial Managed Care - HMO | Attending: Emergency Medicine | Admitting: Emergency Medicine

## 2016-04-14 ENCOUNTER — Emergency Department: Payer: Commercial Managed Care - HMO

## 2016-04-14 DIAGNOSIS — Y999 Unspecified external cause status: Secondary | ICD-10-CM | POA: Insufficient documentation

## 2016-04-14 DIAGNOSIS — S3992XA Unspecified injury of lower back, initial encounter: Secondary | ICD-10-CM | POA: Diagnosis present

## 2016-04-14 DIAGNOSIS — M5440 Lumbago with sciatica, unspecified side: Secondary | ICD-10-CM | POA: Diagnosis not present

## 2016-04-14 DIAGNOSIS — M5441 Lumbago with sciatica, right side: Secondary | ICD-10-CM | POA: Diagnosis not present

## 2016-04-14 DIAGNOSIS — Y939 Activity, unspecified: Secondary | ICD-10-CM | POA: Diagnosis not present

## 2016-04-14 DIAGNOSIS — Y92511 Restaurant or cafe as the place of occurrence of the external cause: Secondary | ICD-10-CM | POA: Diagnosis not present

## 2016-04-14 DIAGNOSIS — Z87891 Personal history of nicotine dependence: Secondary | ICD-10-CM | POA: Diagnosis not present

## 2016-04-14 DIAGNOSIS — I10 Essential (primary) hypertension: Secondary | ICD-10-CM | POA: Diagnosis not present

## 2016-04-14 DIAGNOSIS — S39012A Strain of muscle, fascia and tendon of lower back, initial encounter: Secondary | ICD-10-CM | POA: Diagnosis not present

## 2016-04-14 DIAGNOSIS — W19XXXA Unspecified fall, initial encounter: Secondary | ICD-10-CM | POA: Diagnosis not present

## 2016-04-14 DIAGNOSIS — M5442 Lumbago with sciatica, left side: Secondary | ICD-10-CM | POA: Diagnosis not present

## 2016-04-14 DIAGNOSIS — Z79899 Other long term (current) drug therapy: Secondary | ICD-10-CM | POA: Insufficient documentation

## 2016-04-14 DIAGNOSIS — M545 Low back pain: Secondary | ICD-10-CM | POA: Diagnosis not present

## 2016-04-14 MED ORDER — HYDROCODONE-ACETAMINOPHEN 5-325 MG PO TABS: 1.0000 | ORAL_TABLET | ORAL | 0 refills | Status: DC | PRN

## 2016-04-14 MED ORDER — HYDROCODONE-ACETAMINOPHEN 5-325 MG PO TABS
1.0000 | ORAL_TABLET | ORAL | Status: AC
  Administered 2016-04-14: 1 via ORAL
  Filled 2016-04-14: qty 1

## 2016-04-14 MED ORDER — CYCLOBENZAPRINE HCL 10 MG PO TABS
10.0000 mg | ORAL_TABLET | Freq: Once | ORAL | Status: AC
  Administered 2016-04-14: 10 mg via ORAL
  Filled 2016-04-14: qty 1

## 2016-04-14 NOTE — ED Provider Notes (Signed)
[facility] Provider Note   CSN: [id number] Arrival date & time: 04/14/16  1454     History   Chief Complaint Chief Complaint  Patient presents with  . Back Pain    HPI Christine Higgins is a 60 y.o. female presents to the emergency department for evaluation of lower back pain. Patient injured her lower back, neck and thigh from a fall at [facility] on 03/28/2016. Has been seen for her neck, but not her lower back. Patient has had recent MRI of her lower back showing stenosis. Patient states her back pain is moderate along her midline lower lumbar spine, left and right lower back with left anterior thigh pain numbness and tingling in right posterior thigh pain numbness and tingling. She is ambulatory with assistive devices. Her pain has been constant since her fall. She was recently seen for her neck, given 10 day steroid taper as well as tizanidine. Patient states tizanidine was unable to be covered by her insurance, she is requesting Flexeril. She denies any loss of bowel or bladder symptoms. No weakness of the lower extremities.  HPI  Past Medical History:  Diagnosis Date  . Degenerative disc disease, lumbar   . Fibromyalgia   . Graves disease   . Hyperlipidemia   . Hypertension   . Thyroid disease     Patient Active Problem List   Diagnosis Date Noted  . Back pain at L4-L5 level 10/30/2015  . DDD (degenerative disc disease), lumbosacral 10/30/2015  . Knee pain, bilateral 07/28/2015  . Primary osteoarthritis of both knees 07/28/2015    Past Surgical History:  Procedure Laterality Date  . BACK SURGERY     sumbar  . carpel tunn Right   . carpel tunnel Left   . CESAREAN SECTION    . HAND SURGERY Right    scar tissue removal  . NECK SURGERY     "disk implant"  . SHOULDER SURGERY Right    spur removal  . TOTAL HIP ARTHROPLASTY Right     OB History    No data available       Home Medications    Prior to Admission medications   Medication Sig  Start Date End Date Taking? Authorizing Provider  acetaminophen (TYLENOL) 500 MG tablet Take 500 mg by mouth 2 (two) times daily. 2 tablets    Historical Provider, MD  Ascorbic Acid (VITAMIN C) 1000 MG tablet Take 1,000 mg by mouth every other day.    Historical Provider, MD  atorvastatin (LIPITOR) 10 MG tablet Take 10 mg by mouth daily.    Historical Provider, MD  Calcium-Magnesium-Vitamin D (CALCIUM 1200+D3 PO) Take by mouth every other day.    Historical Provider, MD  clobetasol cream (TEMOVATE) [id number] % Apply 1 application topically 2 (two) times daily.    Historical Provider, MD  clonazePAM (KLONOPIN) 1 MG tablet Take 1 mg by mouth daily.    Historical Provider, MD  clotrimazole-betamethasone (LOTRISONE) cream Apply 1 application topically 2 (two) times daily.    Historical Provider, MD  cyclobenzaprine (FLEXERIL) 10 MG tablet Take 10 mg by mouth 3 (three) times daily as needed for muscle spasms.    Historical Provider, MD  DIPHENHYDRAMINE HCL, SLEEP, PO Take 25 mg by mouth at bedtime as needed. Reported on 10/30/2015    Historical Provider, MD  docusate sodium (COLACE) 100 MG capsule Take 100 mg by mouth as needed for mild constipation.    Historical Provider, MD  DULoxetine (CYMBALTA) 60 MG capsule Take 60 mg by  mouth daily.    Historical Provider, MD  esomeprazole (NEXIUM) 40 MG capsule Take 40 mg by mouth 2 (two) times daily.    Historical Provider, MD  HYDROcodone-acetaminophen (NORCO) 5-325 MG tablet Take 1 tablet by mouth every 4 (four) hours as needed for moderate pain. 04/14/16   Duanne Guess, PA-C  Linaclotide (LINZESS) 290 MCG CAPS capsule Take 290 mcg by mouth daily.    Historical Provider, MD  losartan (COZAAR) 50 MG tablet Take 50 mg by mouth daily.    Historical Provider, MD  meloxicam (MOBIC) 7.5 MG tablet Take 1 tablet (7.5 mg total) by mouth 2 (two) times daily after a meal. 10/06/15   Lance Bosch, MD  methimazole (TAPAZOLE) 10 MG tablet Take 10 mg by mouth daily. 3 tablets     Historical Provider, MD  Multiple Vitamin (MULTIVITAMIN) capsule Take 1 capsule by mouth every other day.    Historical Provider, MD  Omega-3 Fatty Acids (FISH OIL) 1000 MG CAPS Take by mouth every other day. Reported on 10/30/2015    Historical Provider, MD  polyethylene glycol (MIRALAX / GLYCOLAX) packet Take 17 g by mouth as needed.    Historical Provider, MD  traZODone (DESYREL) 150 MG tablet Take by mouth at bedtime.    Historical Provider, MD  triamterene-hydrochlorothiazide (MAXZIDE) 75-50 MG tablet Take 1 tablet by mouth daily.    Historical Provider, MD  vitamin B-12 (CYANOCOBALAMIN) 100 MCG tablet Take 100 mcg by mouth every other day.    Historical Provider, MD  vitamin E 100 UNIT capsule Take by mouth every other day.    Historical Provider, MD    Family History Family History  Problem Relation Age of Onset  . Cancer Mother   . Heart disease Father     Social History Social History  Substance Use Topics  . Smoking status: Former Research scientist (life sciences)  . Smokeless tobacco: Not on file  . Alcohol use No     Allergies   Aspirin   Review of Systems Review of Systems  Constitutional: Negative for activity change, chills, fatigue and fever.  HENT: Negative for congestion, sinus pressure and sore throat.   Eyes: Negative for visual disturbance.  Respiratory: Negative for cough, chest tightness and shortness of breath.   Cardiovascular: Negative for chest pain and leg swelling.  Gastrointestinal: Negative for abdominal pain, diarrhea, nausea and vomiting.  Genitourinary: Negative for dysuria.  Musculoskeletal: Positive for back pain. Negative for arthralgias and gait problem.  Skin: Negative for rash.  Neurological: Negative for weakness, numbness and headaches.  Hematological: Negative for adenopathy.  Psychiatric/Behavioral: Negative for agitation, behavioral problems and confusion.     Physical Exam Updated Vital Signs BP (!) 146/83 (BP Location: Right Arm)   Pulse 99   Temp  98 F (36.7 C) (Oral)   Resp 20   Ht 5\' 11"  (1.803 m)   Wt 115.2 kg   SpO2 98%   BMI 35.43 kg/m   Physical Exam  Constitutional: She is oriented to person, place, and time. She appears well-developed and well-nourished. No distress.  HENT:  Head: Normocephalic and atraumatic.  Mouth/Throat: Oropharynx is clear and moist.  Eyes: EOM are normal. Pupils are equal, round, and reactive to light. Right eye exhibits no discharge. Left eye exhibits no discharge.  Neck: Normal range of motion. Neck supple.  Cardiovascular: Normal rate, regular rhythm and intact distal pulses.   Pulmonary/Chest: Effort normal and breath sounds normal. No respiratory distress. She exhibits no tenderness.  Abdominal: Soft. She exhibits  no distension. There is no tenderness.  Musculoskeletal:  Lumbar Spine: Examination of the lumbar spine reveals no bony abnormality, no edema, and no ecchymosis.  There is no step off.  The patient has decreased range of motion of the lumbar spine with flexion and extension.  The patient has decreased lateral bend and rotation.  The patient has moderate  pain with range of motion activities.   The patient is tender along the spinous process, lower lumbar spine.  The patient is tender along the left greater than right paravertebral muscles of the lumbar spine, with no muscle spasms.  The patient is non tender along the iliac crest.  The patient is non tender in the sciatic notch.  The patient is non tender along the Sacroiliac joint.  There is no Coccyx joint tenderness.    Bilateral Lower Extremities: Examination of the lower extremities reveals no bony abnormality, no edema, and no ecchymosis.  The patient has full active and passive range of motion of the hips, knees, and ankles.  There is no discomfort with range of motion exercises.  The patient is non tender along the greater trochanter region.  The patient has a negative Bevelyn Buckles' test bilaterally.  There is normal skin warmth.  There  is normal capillary refill bilaterally.    Neurologic: The patient has a negative straight leg raise.  The patient has normal muscle strength testing for the quadriceps, calves, ankle dorsiflexion, ankle plantarflexion, and extensor hallicus longus.  The patient has sensation that is intact to light touch.  The deep tendon reflexes are nor   Neurological: She is alert and oriented to person, place, and time. She has normal reflexes.  Skin: Skin is warm and dry.  Psychiatric: She has a normal mood and affect. Her behavior is normal. Thought content normal.     ED Treatments / Results  Labs (all labs ordered are listed, but only abnormal results are displayed) Labs Reviewed - No data to display  EKG  EKG Interpretation None       Radiology No results found.  Procedures Procedures (including critical care time)  Medications Ordered in ED Medications  cyclobenzaprine (FLEXERIL) tablet 10 mg (10 mg Oral Given 04/14/16 1715)  HYDROcodone-acetaminophen (NORCO/VICODIN) 5-325 MG per tablet 1 tablet (1 tablet Oral Given 04/14/16 1715)     Initial Impression / Assessment and Plan / ED Course  I have reviewed the triage vital signs and the nursing notes.  Pertinent labs & imaging results that were available during my care of the patient were reviewed by me and considered in my medical decision making (see chart for details).  Clinical Course    60 year old female with lower back pain and lumbar radicular symptoms. No neurological deficits on exam. X-rays negative for any acute bony abnormality. She will continue steroid taper. Pharmacist was called by patient, insurance will cover tizanidine tablets, this was rewritten for the patient. She is also given a prescription for Norco she'll follow-up with her spine surgeon. Return to the ER for any worsening symptoms urgent changes in her health.  Final Clinical Impressions(s) / ED Diagnoses   Final diagnoses:  Bilateral low back pain  with sciatica, sciatica laterality unspecified  Lumbar strain, initial encounter    New Prescriptions Discharge Medication List as of 04/14/2016  5:05 PM    START taking these medications   Details  HYDROcodone-acetaminophen (NORCO) 5-325 MG tablet Take 1 tablet by mouth every 4 (four) hours as needed for moderate pain., Starting Sun 04/14/2016, Print  Duanne Guess, PA-C 04/18/16 [id number]    Delman Kitten, MD 04/21/16 [phone]

## 2016-04-14 NOTE — Discharge Instructions (Signed)
Please follow-up with Dr. Arnoldo Morale in [place]. Return to the ER for any worsening symptoms urgent changes in health

## 2016-04-14 NOTE — ED Triage Notes (Signed)
States fell aug 3rd in [place]. Has followed up with doctors, but not her "spine" doctor. Hx of fibromyalgia and chronic back pain. Is worried she has a broken back.

## 2016-04-15 ENCOUNTER — Ambulatory Visit
Admission: RE | Admit: 2016-04-15 | Discharge: 2016-04-15 | Disposition: A | Discharge status: Home / Self Care | Payer: Commercial Managed Care - HMO | Source: Ambulatory Visit | Attending: Gastroenterology | Admitting: Gastroenterology

## 2016-04-15 DIAGNOSIS — R1084 Generalized abdominal pain: Secondary | ICD-10-CM | POA: Insufficient documentation

## 2016-04-15 DIAGNOSIS — I700 Atherosclerosis of aorta: Secondary | ICD-10-CM

## 2016-04-15 DIAGNOSIS — R109 Unspecified abdominal pain: Secondary | ICD-10-CM | POA: Diagnosis not present

## 2016-04-15 MED ORDER — IOPAMIDOL (ISOVUE-370) INJECTION 76%
100.0000 mL | Freq: Once | INTRAVENOUS | Status: AC | PRN
Start: 2016-04-15 — End: 2016-04-15
  Administered 2016-04-15: 100 mL via INTRAVENOUS

## 2016-04-17 DIAGNOSIS — M5412 Radiculopathy, cervical region: Secondary | ICD-10-CM | POA: Diagnosis not present

## 2016-04-19 DIAGNOSIS — M5412 Radiculopathy, cervical region: Secondary | ICD-10-CM | POA: Diagnosis not present

## 2016-04-19 DIAGNOSIS — M4806 Spinal stenosis, lumbar region: Secondary | ICD-10-CM | POA: Diagnosis not present

## 2016-04-22 ENCOUNTER — Other Ambulatory Visit: Payer: Self-pay | Admitting: Neurosurgery

## 2016-04-22 DIAGNOSIS — M5412 Radiculopathy, cervical region: Secondary | ICD-10-CM | POA: Diagnosis not present

## 2016-04-22 DIAGNOSIS — M545 Low back pain: Principal | ICD-10-CM

## 2016-04-22 DIAGNOSIS — G8929 Other chronic pain: Secondary | ICD-10-CM

## 2016-04-24 DIAGNOSIS — E119 Type 2 diabetes mellitus without complications: Secondary | ICD-10-CM | POA: Diagnosis not present

## 2016-04-24 DIAGNOSIS — F325 Major depressive disorder, single episode, in full remission: Secondary | ICD-10-CM | POA: Diagnosis not present

## 2016-04-24 DIAGNOSIS — Z6835 Body mass index (BMI) 35.0-35.9, adult: Secondary | ICD-10-CM | POA: Diagnosis not present

## 2016-04-24 DIAGNOSIS — I700 Atherosclerosis of aorta: Secondary | ICD-10-CM | POA: Diagnosis not present

## 2016-04-26 DIAGNOSIS — M79609 Pain in unspecified limb: Secondary | ICD-10-CM | POA: Diagnosis not present

## 2016-04-26 DIAGNOSIS — E785 Hyperlipidemia, unspecified: Secondary | ICD-10-CM | POA: Diagnosis not present

## 2016-04-26 DIAGNOSIS — M5412 Radiculopathy, cervical region: Secondary | ICD-10-CM | POA: Diagnosis not present

## 2016-04-26 DIAGNOSIS — I700 Atherosclerosis of aorta: Secondary | ICD-10-CM | POA: Diagnosis not present

## 2016-04-26 DIAGNOSIS — I10 Essential (primary) hypertension: Secondary | ICD-10-CM | POA: Diagnosis not present

## 2016-05-02 ENCOUNTER — Other Ambulatory Visit: Payer: Self-pay | Admitting: Orthopedic Surgery

## 2016-05-02 DIAGNOSIS — Z01419 Encounter for gynecological examination (general) (routine) without abnormal findings: Secondary | ICD-10-CM | POA: Diagnosis not present

## 2016-05-02 DIAGNOSIS — M5416 Radiculopathy, lumbar region: Secondary | ICD-10-CM

## 2016-05-02 DIAGNOSIS — E669 Obesity, unspecified: Secondary | ICD-10-CM | POA: Diagnosis not present

## 2016-05-02 DIAGNOSIS — M5412 Radiculopathy, cervical region: Secondary | ICD-10-CM | POA: Diagnosis not present

## 2016-05-02 DIAGNOSIS — M5126 Other intervertebral disc displacement, lumbar region: Secondary | ICD-10-CM | POA: Diagnosis not present

## 2016-05-02 DIAGNOSIS — Z1211 Encounter for screening for malignant neoplasm of colon: Secondary | ICD-10-CM | POA: Diagnosis not present

## 2016-05-02 DIAGNOSIS — R29898 Other symptoms and signs involving the musculoskeletal system: Secondary | ICD-10-CM | POA: Diagnosis not present

## 2016-05-02 DIAGNOSIS — M47816 Spondylosis without myelopathy or radiculopathy, lumbar region: Secondary | ICD-10-CM

## 2016-05-02 DIAGNOSIS — Z124 Encounter for screening for malignant neoplasm of cervix: Secondary | ICD-10-CM | POA: Diagnosis not present

## 2016-05-02 DIAGNOSIS — N912 Amenorrhea, unspecified: Secondary | ICD-10-CM | POA: Diagnosis not present

## 2016-05-02 DIAGNOSIS — F329 Major depressive disorder, single episode, unspecified: Secondary | ICD-10-CM | POA: Diagnosis not present

## 2016-05-06 ENCOUNTER — Ambulatory Visit
Admission: RE | Admit: 2016-05-06 | Discharge: 2016-05-06 | Disposition: A | Discharge status: Home / Self Care | Payer: Commercial Managed Care - HMO | Source: Ambulatory Visit | Attending: Neurosurgery | Admitting: Neurosurgery

## 2016-05-06 VITALS — BP 156/70 | HR 82

## 2016-05-06 DIAGNOSIS — G8929 Other chronic pain: Secondary | ICD-10-CM

## 2016-05-06 DIAGNOSIS — M545 Low back pain, unspecified: Secondary | ICD-10-CM

## 2016-05-06 DIAGNOSIS — M4806 Spinal stenosis, lumbar region: Secondary | ICD-10-CM | POA: Diagnosis not present

## 2016-05-06 DIAGNOSIS — M5137 Other intervertebral disc degeneration, lumbosacral region: Secondary | ICD-10-CM

## 2016-05-06 MED ORDER — IOPAMIDOL (ISOVUE-M 200) INJECTION 41%
15.0000 mL | Freq: Once | INTRAMUSCULAR | Status: AC
  Administered 2016-05-06: 15 mL via INTRATHECAL

## 2016-05-06 MED ORDER — ONDANSETRON HCL 4 MG/2ML IJ SOLN
4.0000 mg | Freq: Once | INTRAMUSCULAR | Status: AC
  Administered 2016-05-06: 4 mg via INTRAMUSCULAR

## 2016-05-06 MED ORDER — DIAZEPAM 5 MG PO TABS
10.0000 mg | ORAL_TABLET | Freq: Once | ORAL | Status: AC
  Administered 2016-05-06: 10 mg via ORAL

## 2016-05-06 MED ORDER — MEPERIDINE HCL 100 MG/ML IJ SOLN
100.0000 mg | Freq: Once | INTRAMUSCULAR | Status: AC
  Administered 2016-05-06: 100 mg via INTRAMUSCULAR

## 2016-05-06 NOTE — Discharge Instructions (Signed)
Myelogram Discharge Instructions  1. Go home and rest quietly for the next 24 hours.  It is important to lie flat for the next 24 hours.  Get up only to go to the restroom.  You may lie in the bed or on a couch on your back, your stomach, your left side or your right side.  You may have one pillow under your head.  You may have pillows between your knees while you are on your side or under your knees while you are on your back.  2. DO NOT drive today.  Recline the seat as far back as it will go, while still wearing your seat belt, on the way home.  3. You may get up to go to the bathroom as needed.  You may sit up for 10 minutes to eat.  You may resume your normal diet and medications unless otherwise indicated.  Drink lots of extra fluids today and tomorrow.  4. The incidence of headache, nausea, or vomiting is about 5% (one in 20 patients).  If you develop a headache, lie flat and drink plenty of fluids until the headache goes away.  Caffeinated beverages may be helpful.  If you develop severe nausea and vomiting or a headache that does not go away with flat bed rest, call [phone].  5. You may resume normal activities after your 24 hours of bed rest is over; however, do not exert yourself strongly or do any heavy lifting tomorrow. If when you get up you have a headache when standing, go back to bed and force fluids for another 24 hours.  6. Call your physician for a follow-up appointment.  The results of your myelogram will be sent directly to your physician by the following day.  7. If you have any questions or if complications develop after you arrive home, please call [phone].  Discharge instructions have been explained to the patient.  The patient, or the person responsible for the patient, fully understands these instructions.       MAY RESUME CYMBALTA AND TRAZODONE ON SEPT. 12, 2017, AFTER 1:00 PM.

## 2016-05-06 NOTE — Progress Notes (Signed)
Patient states she has been off Cymbalta and Trazodone for the past two days.  Brita Romp, RN

## 2016-05-08 ENCOUNTER — Telehealth: Payer: Self-pay | Admitting: Radiology

## 2016-05-08 NOTE — Telephone Encounter (Signed)
Pt doing well post myelo. No problems.

## 2016-05-10 DIAGNOSIS — M1732 Unilateral post-traumatic osteoarthritis, left knee: Secondary | ICD-10-CM | POA: Diagnosis not present

## 2016-05-10 DIAGNOSIS — M25562 Pain in left knee: Secondary | ICD-10-CM | POA: Diagnosis not present

## 2016-05-10 DIAGNOSIS — M5412 Radiculopathy, cervical region: Secondary | ICD-10-CM | POA: Diagnosis not present

## 2016-05-10 DIAGNOSIS — M170 Bilateral primary osteoarthritis of knee: Secondary | ICD-10-CM | POA: Diagnosis not present

## 2016-05-13 DIAGNOSIS — M5412 Radiculopathy, cervical region: Secondary | ICD-10-CM | POA: Diagnosis not present

## 2016-05-14 DIAGNOSIS — M79672 Pain in left foot: Secondary | ICD-10-CM | POA: Diagnosis not present

## 2016-05-14 DIAGNOSIS — S93602A Unspecified sprain of left foot, initial encounter: Secondary | ICD-10-CM | POA: Diagnosis not present

## 2016-05-16 ENCOUNTER — Ambulatory Visit
Admission: RE | Admit: 2016-05-16 | Discharge: 2016-05-16 | Disposition: A | Discharge status: Home / Self Care | Payer: Commercial Managed Care - HMO | Source: Ambulatory Visit | Attending: Orthopedic Surgery | Admitting: Orthopedic Surgery

## 2016-05-16 DIAGNOSIS — M4804 Spinal stenosis, thoracic region: Secondary | ICD-10-CM | POA: Diagnosis not present

## 2016-05-16 DIAGNOSIS — M47816 Spondylosis without myelopathy or radiculopathy, lumbar region: Secondary | ICD-10-CM

## 2016-05-16 DIAGNOSIS — M5126 Other intervertebral disc displacement, lumbar region: Secondary | ICD-10-CM | POA: Diagnosis not present

## 2016-05-16 DIAGNOSIS — M5416 Radiculopathy, lumbar region: Secondary | ICD-10-CM | POA: Diagnosis not present

## 2016-05-16 DIAGNOSIS — R29898 Other symptoms and signs involving the musculoskeletal system: Secondary | ICD-10-CM | POA: Insufficient documentation

## 2016-05-16 DIAGNOSIS — M5117 Intervertebral disc disorders with radiculopathy, lumbosacral region: Secondary | ICD-10-CM | POA: Diagnosis not present

## 2016-05-16 DIAGNOSIS — M4806 Spinal stenosis, lumbar region: Secondary | ICD-10-CM | POA: Insufficient documentation

## 2016-05-17 DIAGNOSIS — M5412 Radiculopathy, cervical region: Secondary | ICD-10-CM | POA: Diagnosis not present

## 2016-05-17 DIAGNOSIS — M170 Bilateral primary osteoarthritis of knee: Secondary | ICD-10-CM | POA: Diagnosis not present

## 2016-05-20 DIAGNOSIS — M5412 Radiculopathy, cervical region: Secondary | ICD-10-CM | POA: Diagnosis not present

## 2016-05-21 DIAGNOSIS — I10 Essential (primary) hypertension: Secondary | ICD-10-CM | POA: Diagnosis not present

## 2016-05-21 DIAGNOSIS — Z6835 Body mass index (BMI) 35.0-35.9, adult: Secondary | ICD-10-CM | POA: Diagnosis not present

## 2016-05-21 DIAGNOSIS — M545 Low back pain: Secondary | ICD-10-CM | POA: Diagnosis not present

## 2016-05-23 ENCOUNTER — Other Ambulatory Visit: Payer: Self-pay | Admitting: Orthopedic Surgery

## 2016-05-23 DIAGNOSIS — M5412 Radiculopathy, cervical region: Secondary | ICD-10-CM

## 2016-05-23 DIAGNOSIS — N912 Amenorrhea, unspecified: Secondary | ICD-10-CM | POA: Diagnosis not present

## 2016-05-24 DIAGNOSIS — M170 Bilateral primary osteoarthritis of knee: Secondary | ICD-10-CM | POA: Diagnosis not present

## 2016-05-24 DIAGNOSIS — M5412 Radiculopathy, cervical region: Secondary | ICD-10-CM | POA: Diagnosis not present

## 2016-05-29 DIAGNOSIS — M5412 Radiculopathy, cervical region: Secondary | ICD-10-CM | POA: Diagnosis not present

## 2016-05-31 DIAGNOSIS — M5412 Radiculopathy, cervical region: Secondary | ICD-10-CM | POA: Diagnosis not present

## 2016-06-04 DIAGNOSIS — M5412 Radiculopathy, cervical region: Secondary | ICD-10-CM | POA: Diagnosis not present

## 2016-06-05 ENCOUNTER — Ambulatory Visit: Payer: Commercial Managed Care - HMO

## 2016-06-07 DIAGNOSIS — M5412 Radiculopathy, cervical region: Secondary | ICD-10-CM | POA: Diagnosis not present

## 2016-06-09 ENCOUNTER — Emergency Department: Payer: Commercial Managed Care - HMO

## 2016-06-09 ENCOUNTER — Emergency Department (HOSPITAL_COMMUNITY): Payer: Self-pay

## 2016-06-09 ENCOUNTER — Emergency Department
Admission: EM | Admit: 2016-06-09 | Discharge: 2016-06-09 | Disposition: A | Discharge status: Home / Self Care | Payer: Commercial Managed Care - HMO | Attending: Emergency Medicine | Admitting: Emergency Medicine

## 2016-06-09 DIAGNOSIS — Z79891 Long term (current) use of opiate analgesic: Secondary | ICD-10-CM | POA: Insufficient documentation

## 2016-06-09 DIAGNOSIS — R51 Headache: Secondary | ICD-10-CM | POA: Insufficient documentation

## 2016-06-09 DIAGNOSIS — Z79899 Other long term (current) drug therapy: Secondary | ICD-10-CM | POA: Diagnosis not present

## 2016-06-09 DIAGNOSIS — R531 Weakness: Secondary | ICD-10-CM | POA: Insufficient documentation

## 2016-06-09 DIAGNOSIS — R200 Anesthesia of skin: Secondary | ICD-10-CM | POA: Diagnosis not present

## 2016-06-09 DIAGNOSIS — R296 Repeated falls: Secondary | ICD-10-CM | POA: Diagnosis not present

## 2016-06-09 DIAGNOSIS — Z87891 Personal history of nicotine dependence: Secondary | ICD-10-CM | POA: Insufficient documentation

## 2016-06-09 DIAGNOSIS — S0990XA Unspecified injury of head, initial encounter: Secondary | ICD-10-CM | POA: Diagnosis not present

## 2016-06-09 DIAGNOSIS — Z791 Long term (current) use of non-steroidal anti-inflammatories (NSAID): Secondary | ICD-10-CM | POA: Insufficient documentation

## 2016-06-09 DIAGNOSIS — I10 Essential (primary) hypertension: Secondary | ICD-10-CM | POA: Diagnosis not present

## 2016-06-09 LAB — BASIC METABOLIC PANEL
Anion gap: 7 (ref 5–15)
BUN: 12 mg/dL (ref 6–20)
CO2: 28 mmol/L (ref 22–32)
Calcium: 9.6 mg/dL (ref 8.9–10.3)
Chloride: 102 mmol/L (ref 101–111)
Creatinine, Ser: 0.76 mg/dL (ref 0.44–1.00)
GFR calc Af Amer: >60 mL/min (ref >60)
GFR calc non Af Amer: >60 mL/min (ref >60)
Glucose, Bld: 113 mg/dL — ABNORMAL HIGH (ref 65–99)
Potassium: 3.1 mmol/L — ABNORMAL LOW (ref 3.5–5.1)
Sodium: 137 mmol/L (ref 135–145)

## 2016-06-09 LAB — CBC
HCT: 38.4 % (ref 35.0–47.0)
Hemoglobin: 12.9 g/dL (ref 12.0–16.0)
MCH: 27.2 pg (ref 26.0–34.0)
MCHC: 33.5 g/dL (ref 32.0–36.0)
MCV: 81.1 fL (ref 80.0–100.0)
Platelets: 135 10*3/uL — ABNORMAL LOW (ref 150–440)
RBC: 4.73 MIL/uL (ref 3.80–5.20)
RDW: 14.7 % — ABNORMAL HIGH (ref 11.5–14.5)
WBC: 3.9 10*3/uL (ref 3.6–11.0)

## 2016-06-09 LAB — URINALYSIS COMPLETE WITH MICROSCOPIC (ARMC ONLY)
Bacteria, UA: NONE SEEN
Bilirubin Urine: NEGATIVE
Glucose, UA: NEGATIVE mg/dL
Hgb urine dipstick: NEGATIVE
Ketones, ur: NEGATIVE mg/dL
Leukocytes, UA: NEGATIVE
Nitrite: NEGATIVE
Protein, ur: NEGATIVE mg/dL
RBC / HPF: NONE SEEN RBC/hpf (ref 0–5)
Specific Gravity, Urine: 1.002 — ABNORMAL LOW (ref 1.005–1.030)
WBC, UA: NONE SEEN WBC/hpf (ref 0–5)
pH: 6 (ref 5.0–8.0)

## 2016-06-09 LAB — TROPONIN I: Troponin I: <0.03 ng/mL (ref <0.03)

## 2016-06-09 LAB — GLUCOSE, CAPILLARY: Glucose-Capillary: 148 mg/dL — ABNORMAL HIGH (ref 65–99)

## 2016-06-09 MED ORDER — MORPHINE SULFATE (PF) 4 MG/ML IV SOLN
4.0000 mg | Freq: Once | INTRAVENOUS | Status: AC
  Administered 2016-06-09: 4 mg via INTRAVENOUS
  Filled 2016-06-09: qty 1

## 2016-06-09 MED ORDER — ONDANSETRON HCL 4 MG/2ML IJ SOLN
4.0000 mg | Freq: Once | INTRAMUSCULAR | Status: AC
  Administered 2016-06-09: 4 mg via INTRAVENOUS
  Filled 2016-06-09: qty 2

## 2016-06-09 NOTE — ED Notes (Signed)
MRI contacted. 

## 2016-06-09 NOTE — ED Triage Notes (Signed)
Pt reports numbness in both legs then fell and hit head. States this happen yesterday. Pt reports continued numbness and to legs and frontal headache that started last night. Denies visual changes. Denies use of anticoagulants

## 2016-06-09 NOTE — ED Provider Notes (Signed)
[facility] Emergency Department Provider Note   ____________________________________________   First MD Initiated Contact with Patient 06/09/16 1148     (approximate)  I have reviewed the triage vital signs and the nursing notes.   HISTORY  Chief Complaint Fall and Headache    HPI Christine Higgins is a 60 y.o. female patient reports she fell and August 3 in [employer] and slid into the wall and hit her head hurt her back since then she's beginning physical therapy. She reports 2-1/2 weeks ago she stood up her legs gave out she fell down last morning at 4:00 she went to the bathroom when she got up her legs were numb and she felt and hit her self again and hit her head on the second on the floor didn't pass out she had a bad headache put ice on the head headache got better she woke up this morning had another bad headache headache today is severe she's only had one other when like it. She reports her knee legs are numb and tingly in fact her whole body is numb and tingly she says she has fibromyalgia's and sometimes she gets numb and tingly but the legs especially on her right dominant and she is very worried about the getting weak like this over and over again.   Past Medical History:  Diagnosis Date  . Degenerative disc disease, lumbar   . Fibromyalgia   . Graves disease   . Hyperlipidemia   . Hypertension   . Thyroid disease     Patient Active Problem List   Diagnosis Date Noted  . Back pain at L4-L5 level 10/30/2015  . DDD (degenerative disc disease), lumbosacral 10/30/2015  . Knee pain, bilateral 07/28/2015  . Primary osteoarthritis of both knees 07/28/2015    Past Surgical History:  Procedure Laterality Date  . BACK SURGERY     sumbar  . carpel tunn Right   . carpel tunnel Left   . CESAREAN SECTION    . HAND SURGERY Right    scar tissue removal  . NECK SURGERY     "disk implant"  . SHOULDER SURGERY Right    spur removal  . TOTAL HIP  ARTHROPLASTY Right     Prior to Admission medications   Medication Sig Start Date End Date Taking? Authorizing Provider  acetaminophen (TYLENOL) 500 MG tablet Take 500 mg by mouth 2 (two) times daily. 2 tablets    Historical Provider, MD  Ascorbic Acid (VITAMIN C) 1000 MG tablet Take 1,000 mg by mouth every other day.    Historical Provider, MD  atorvastatin (LIPITOR) 10 MG tablet Take 10 mg by mouth daily.    Historical Provider, MD  Calcium-Magnesium-Vitamin D (CALCIUM 1200+D3 PO) Take by mouth every other day.    Historical Provider, MD  clobetasol cream (TEMOVATE) [id number] % Apply 1 application topically 2 (two) times daily.    Historical Provider, MD  clonazePAM (KLONOPIN) 1 MG tablet Take 1 mg by mouth daily.    Historical Provider, MD  clotrimazole-betamethasone (LOTRISONE) cream Apply 1 application topically 2 (two) times daily.    Historical Provider, MD  DIPHENHYDRAMINE HCL, SLEEP, PO Take 25 mg by mouth at bedtime as needed. Reported on 10/30/2015    Historical Provider, MD  docusate sodium (COLACE) 100 MG capsule Take 100 mg by mouth as needed for mild constipation.    Historical Provider, MD  DULoxetine (CYMBALTA) 60 MG capsule Take 60 mg by mouth daily.    Historical Provider,  MD  esomeprazole (NEXIUM) 40 MG capsule Take 40 mg by mouth 2 (two) times daily.    Historical Provider, MD  HYDROcodone-acetaminophen (NORCO) 5-325 MG tablet Take 1 tablet by mouth every 4 (four) hours as needed for moderate pain. 04/14/16   Duanne Guess, PA-C  Linaclotide (LINZESS) 290 MCG CAPS capsule Take 290 mcg by mouth daily.    Historical Provider, MD  losartan (COZAAR) 50 MG tablet Take 50 mg by mouth daily.    Historical Provider, MD  meloxicam (MOBIC) 7.5 MG tablet Take 1 tablet (7.5 mg total) by mouth 2 (two) times daily after a meal. 10/06/15   Lance Bosch, MD  methimazole (TAPAZOLE) 10 MG tablet Take 10 mg by mouth daily. 3 tablets    Historical Provider, MD  Multiple Vitamin (MULTIVITAMIN)  capsule Take 1 capsule by mouth every other day.    Historical Provider, MD  Omega-3 Fatty Acids (FISH OIL) 1000 MG CAPS Take by mouth every other day. Reported on 10/30/2015    Historical Provider, MD  polyethylene glycol (MIRALAX / GLYCOLAX) packet Take 17 g by mouth as needed.    Historical Provider, MD  tiZANidine (ZANAFLEX) 4 MG capsule Take 4 mg by mouth 3 (three) times daily.    Historical Provider, MD  traZODone (DESYREL) 150 MG tablet Take by mouth at bedtime.    Historical Provider, MD  triamterene-hydrochlorothiazide (MAXZIDE) 75-50 MG tablet Take 1 tablet by mouth daily.    Historical Provider, MD  vitamin B-12 (CYANOCOBALAMIN) 100 MCG tablet Take 100 mcg by mouth every other day.    Historical Provider, MD  vitamin E 100 UNIT capsule Take by mouth every other day.    Historical Provider, MD    Allergies Aspirin  Family History  Problem Relation Age of Onset  . Cancer Mother   . Heart disease Father     Social History Social History  Substance Use Topics  . Smoking status: Former Research scientist (life sciences)  . Smokeless tobacco: Not on file  . Alcohol use No    Review of Systems Constitutional: No fever/chills Eyes: No visual changes. ENT: No sore throat. Cardiovascular: Denies chest pain. Respiratory: Denies shortness of breath. Gastrointestinal: No abdominal pain.  No nausea, no vomiting.  No diarrhea.  No constipation. Genitourinary: Negative for dysuria. Musculoskeletal: Negative for back pain. Skin: Negative for rash. Neurological:See history of present illness  10-point ROS otherwise negative.  ____________________________________________   PHYSICAL EXAM:  VITAL SIGNS: ED Triage Vitals  Enc Vitals Group     BP 06/09/16 1012 (!) 161/85     Pulse Rate 06/09/16 1012 (!) 108     Resp 06/09/16 1012 18     Temp 06/09/16 1012 97.6 F (36.4 C)     Temp Source 06/09/16 1012 Oral     SpO2 06/09/16 1012 98 %     Weight 06/09/16 1012 251 lb (113.9 kg)     Height 06/09/16 1012  5\' 11"  (1.803 m)     Head Circumference --      Peak Flow --      Pain Score 06/09/16 1017 8     Pain Loc --      Pain Edu? --      Excl. in [place]? --    Constitutional: Alert and oriented. Well appearing and in no acute distress. Eyes: Conjunctivae are normal. PERRL. EOMI. unable to see fundi Head: Atraumatic. Nose: No congestion/rhinnorhea. Mouth/Throat: Mucous membranes are moist.  Oropharynx non-erythematous. Neck: No stridor. Cardiovascular: Normal rate, regular rhythm. Grossly  normal heart sounds.  Good peripheral circulation. Respiratory: Normal respiratory effort.  No retractions. Lungs CTAB. Gastrointestinal: Soft and nontender. No distention. No abdominal bruits. No CVA tenderness. Musculoskeletal: No lower extremity tenderness nor edema.  No joint effusions. Neurologic:  Normal speech and language. Cranial nerves II through XII are intact finger-nose and rapid alternating movements and hands are slowed patient is very tremulous and grip strength intermittently tightens and weekends when I test that as well as the strength in her arms and shoulders. Legs are the same somewhat weaker than the arms perhaps. Patient reports her legs are numb belly does not appear to be numb. Skin:  Skin is warm, dry and intact. No rash noted. Psychiatric: Mood and affect are normal. Speech and behavior are normal.  ____________________________________________   LABS (all labs ordered are listed, but only abnormal results are displayed)  Labs Reviewed  BASIC METABOLIC PANEL - Abnormal; Notable for the following:       Result Value   Potassium 3.1 (*)    Glucose, Bld 113 (*)    All other components within normal limits  CBC - Abnormal; Notable for the following:    RDW 14.7 (*)    Platelets 135 (*)    All other components within normal limits  URINALYSIS COMPLETEWITH MICROSCOPIC ([facility] ONLY) - Abnormal; Notable for the following:    Color, Urine COLORLESS (*)    APPearance CLEAR (*)     Specific Gravity, Urine 1.002 (*)    Squamous Epithelial / LPF 0-5 (*)    All other components within normal limits  GLUCOSE, CAPILLARY - Abnormal; Notable for the following:    Glucose-Capillary 148 (*)    All other components within normal limits  TROPONIN I  CBG MONITORING, ED   ____________________________________________  EKG   ____________________________________________  RADIOLOGY  Study Result   CLINICAL DATA:  Numbness in both legs and then fell and hit head yesterday.  EXAM: CT HEAD WITHOUT CONTRAST  TECHNIQUE: Contiguous axial images were obtained from the base of the skull through the vertex without intravenous contrast.  COMPARISON:  None.  FINDINGS: Brain: There is no evidence for acute hemorrhage, hydrocephalus, mass lesion, or abnormal extra-axial fluid collection. No definite CT evidence for acute infarction.  Vascular: No hyperdense vessel or unexpected calcification.  Skull: No evidence for fracture. No worrisome lytic or sclerotic lesion.  Sinuses/Orbits: The visualized paranasal sinuses and mastoid air cells are clear. Visualized portions of the globes and intraorbital fat are unremarkable.  Other: None.  IMPRESSION: No acute intracranial abnormality.   Electronically Signed   By: Misty Stanley M.D.   On: 06/09/2016 11:17     ____________________________________________   PROCEDURES  Procedure(s) performed:   Procedures  Critical Care performed:  ____________________________________________   INITIAL IMPRESSION / ASSESSMENT AND PLAN / ED COURSE  Pertinent labs & imaging results that were available during my care of the patient were reviewed by me and considered in my medical decision making (see chart for details).    Clinical Course     ____________________________________________   FINAL CLINICAL IMPRESSION(S) / ED DIAGNOSES  Final diagnoses:  Weakness    Patient is currently in MRI will  sign the patient out to Dr. Fraser Din house  NEW MEDICATIONS STARTED DURING THIS VISIT:  New Prescriptions   No medications on file     Note:  This document was prepared using Dragon voice recognition software and may include unintentional dictation errors.    Nena Polio, MD 06/09/16 [phone]

## 2016-06-09 NOTE — ED Provider Notes (Signed)
-----------------------------------------   6:21 PM on 06/09/2016 -----------------------------------------  Patient care assumed from Dr. Rip Harbour. Patient's workup is largely unrevealing. No concerning abnormality on MRI. Patient has follow-up with her neurosurgeon on Thursday. We will discharge home at this time.   Harvest Dark, MD 06/09/16 Vernelle Emerald

## 2016-06-13 ENCOUNTER — Encounter (INDEPENDENT_AMBULATORY_CARE_PROVIDER_SITE_OTHER): Payer: Commercial Managed Care - HMO

## 2016-06-13 ENCOUNTER — Ambulatory Visit (INDEPENDENT_AMBULATORY_CARE_PROVIDER_SITE_OTHER): Payer: Commercial Managed Care - HMO | Admitting: Vascular Surgery

## 2016-06-14 ENCOUNTER — Ambulatory Visit: Admission: RE | Admit: 2016-06-14 | Payer: Commercial Managed Care - HMO | Source: Ambulatory Visit

## 2016-06-14 DIAGNOSIS — M5412 Radiculopathy, cervical region: Secondary | ICD-10-CM | POA: Diagnosis not present

## 2016-06-19 DIAGNOSIS — M5412 Radiculopathy, cervical region: Secondary | ICD-10-CM | POA: Diagnosis not present

## 2016-06-21 DIAGNOSIS — M5412 Radiculopathy, cervical region: Secondary | ICD-10-CM | POA: Diagnosis not present

## 2016-06-24 DIAGNOSIS — R2241 Localized swelling, mass and lump, right lower limb: Secondary | ICD-10-CM | POA: Diagnosis not present

## 2016-06-24 DIAGNOSIS — M25561 Pain in right knee: Secondary | ICD-10-CM | POA: Diagnosis not present

## 2016-06-24 DIAGNOSIS — M79651 Pain in right thigh: Secondary | ICD-10-CM | POA: Diagnosis not present

## 2016-06-24 DIAGNOSIS — M25562 Pain in left knee: Secondary | ICD-10-CM | POA: Diagnosis not present

## 2016-06-24 DIAGNOSIS — M25551 Pain in right hip: Secondary | ICD-10-CM | POA: Diagnosis not present

## 2016-06-24 DIAGNOSIS — R29898 Other symptoms and signs involving the musculoskeletal system: Secondary | ICD-10-CM | POA: Diagnosis not present

## 2016-06-24 DIAGNOSIS — G8929 Other chronic pain: Secondary | ICD-10-CM | POA: Diagnosis not present

## 2016-06-25 ENCOUNTER — Other Ambulatory Visit: Payer: Self-pay | Admitting: Orthopedic Surgery

## 2016-06-25 DIAGNOSIS — M25551 Pain in right hip: Secondary | ICD-10-CM

## 2016-06-25 DIAGNOSIS — M5412 Radiculopathy, cervical region: Secondary | ICD-10-CM | POA: Diagnosis not present

## 2016-06-25 DIAGNOSIS — M79651 Pain in right thigh: Secondary | ICD-10-CM

## 2016-06-28 DIAGNOSIS — M5412 Radiculopathy, cervical region: Secondary | ICD-10-CM | POA: Diagnosis not present

## 2016-06-28 DIAGNOSIS — M542 Cervicalgia: Secondary | ICD-10-CM | POA: Diagnosis not present

## 2016-07-04 DIAGNOSIS — S93602D Unspecified sprain of left foot, subsequent encounter: Secondary | ICD-10-CM | POA: Diagnosis not present

## 2016-07-04 DIAGNOSIS — G5762 Lesion of plantar nerve, left lower limb: Secondary | ICD-10-CM | POA: Diagnosis not present

## 2016-07-09 ENCOUNTER — Ambulatory Visit
Admission: RE | Admit: 2016-07-09 | Discharge: 2016-07-09 | Disposition: A | Discharge status: Home / Self Care | Payer: Commercial Managed Care - HMO | Source: Ambulatory Visit | Attending: Orthopedic Surgery | Admitting: Orthopedic Surgery

## 2016-07-09 DIAGNOSIS — M25551 Pain in right hip: Secondary | ICD-10-CM | POA: Insufficient documentation

## 2016-07-09 DIAGNOSIS — M79651 Pain in right thigh: Secondary | ICD-10-CM | POA: Insufficient documentation

## 2016-07-12 DIAGNOSIS — E0500 Thyrotoxicosis with diffuse goiter without thyrotoxic crisis or storm: Secondary | ICD-10-CM | POA: Diagnosis not present

## 2016-07-12 DIAGNOSIS — Z5189 Encounter for other specified aftercare: Secondary | ICD-10-CM | POA: Diagnosis not present

## 2016-07-12 DIAGNOSIS — M5412 Radiculopathy, cervical region: Secondary | ICD-10-CM | POA: Diagnosis not present

## 2016-07-12 DIAGNOSIS — M170 Bilateral primary osteoarthritis of knee: Secondary | ICD-10-CM | POA: Diagnosis not present

## 2016-07-16 DIAGNOSIS — R29898 Other symptoms and signs involving the musculoskeletal system: Secondary | ICD-10-CM | POA: Insufficient documentation

## 2016-07-22 DIAGNOSIS — E0500 Thyrotoxicosis with diffuse goiter without thyrotoxic crisis or storm: Secondary | ICD-10-CM | POA: Diagnosis not present

## 2016-07-22 DIAGNOSIS — E042 Nontoxic multinodular goiter: Secondary | ICD-10-CM | POA: Diagnosis not present

## 2016-07-29 ENCOUNTER — Encounter (INDEPENDENT_AMBULATORY_CARE_PROVIDER_SITE_OTHER): Payer: Commercial Managed Care - HMO

## 2016-07-29 ENCOUNTER — Ambulatory Visit (INDEPENDENT_AMBULATORY_CARE_PROVIDER_SITE_OTHER): Payer: Commercial Managed Care - HMO | Admitting: Vascular Surgery

## 2016-07-29 DIAGNOSIS — M62838 Other muscle spasm: Secondary | ICD-10-CM | POA: Diagnosis not present

## 2016-07-29 DIAGNOSIS — M5412 Radiculopathy, cervical region: Secondary | ICD-10-CM | POA: Diagnosis not present

## 2016-07-29 DIAGNOSIS — M5030 Other cervical disc degeneration, unspecified cervical region: Secondary | ICD-10-CM | POA: Diagnosis not present

## 2016-07-29 DIAGNOSIS — M797 Fibromyalgia: Secondary | ICD-10-CM | POA: Diagnosis not present

## 2016-08-12 DIAGNOSIS — M79651 Pain in right thigh: Secondary | ICD-10-CM | POA: Diagnosis not present

## 2016-08-21 ENCOUNTER — Ambulatory Visit: Payer: Commercial Managed Care - HMO | Attending: Physical Medicine and Rehabilitation

## 2016-08-21 DIAGNOSIS — M542 Cervicalgia: Secondary | ICD-10-CM | POA: Diagnosis not present

## 2016-08-21 DIAGNOSIS — R293 Abnormal posture: Secondary | ICD-10-CM | POA: Diagnosis not present

## 2016-08-21 NOTE — Therapy (Signed)
[place] MAIN [facility] SERVICES [address] [place], [place], [address] Phone: [phone]   Fax:  [phone]  Physical Therapy Evaluation  Patient Details  Name: Christine Higgins MRN: [id number] Date of Birth: March 02, 1956 Referring Provider: Dr. Sharlet Salina  Encounter Date: 08/21/2016      PT End of Session - 08/21/16 1033    Visit Number 1   Number of Visits 16   Date for PT Re-Evaluation 10/16/16   Authorization Type 1/10 G Code   PT Start Time 0945   PT Stop Time 1030   PT Time Calculation (min) 45 min   Activity Tolerance Patient limited by pain   Behavior During Therapy [facility] for tasks assessed/performed      Past Medical History:  Diagnosis Date  . Degenerative disc disease, lumbar   . Fibromyalgia   . Graves disease   . Hyperlipidemia   . Hypertension   . Thyroid disease     Past Surgical History:  Procedure Laterality Date  . BACK SURGERY     sumbar  . carpel tunn Right   . carpel tunnel Left   . CESAREAN SECTION    . HAND SURGERY Right    scar tissue removal  . NECK SURGERY     "disk implant"  . SHOULDER SURGERY Right    spur removal  . TOTAL HIP ARTHROPLASTY Right     There were no vitals filed for this visit.       Subjective Assessment - 08/21/16 1000    Subjective Patient states increased neck and mid/ upper back secondary to a fall at [facility], patient states she fell onto the wall and slid across the floor. Reports pains at a 9/10 and 10/10 with activity. Patient reports difficulty with all neck movements most noteabley with rotations. Patient reports she feels traumatized from the experience. Patient demonstrates difficulty with walking, playing with grandkids, standing, donning/doffing clothes and bending. Patient reports difficulty with using UE's such as writing (can only write ~ 38min).    Limitations Lifting;Standing   How long can you stand comfortably? 2 min   How long can you walk comfortably? 68min     Patient Stated Goals Wants to be able to use UE's more and improve neck function.   Currently in Pain? Yes   Pain Score 9    Pain Location Neck   Pain Orientation Left;Right;Lateral;Lower;Mid   Pain Descriptors / Indicators Aching;Burning;Throbbing;Stabbing;Nagging   Pain Type Acute pain   Pain Radiating Towards Pain radiating into the mid back   Pain Onset More than a month ago   Pain Frequency Constant   Aggravating Factors  All cervical movements            [facility] PT Assessment - 08/21/16 0951      Assessment   Medical Diagnosis Fibromyalgia   Referring Provider Dr. Sharlet Salina   Onset Date/Surgical Date 03/28/16   Hand Dominance Right   Next MD Visit unknown   Prior Therapy Yes     Balance Screen   Has the patient fallen in the past 6 months Yes   How many times? 3   Has the patient had a decrease in activity level because of a fear of falling?  Yes   Is the patient reluctant to leave their home because of a fear of falling?  Yes     [place] Private residence   Living Arrangements Children   Available Help at Discharge Family  Type of Home Apartment   Home Access Stairs to enter   Entrance Stairs-Number of Steps 2   Entrance Stairs-Rails Can reach both   Home Layout One level   [place] - single point;Toilet riser;Tub bench;Walker - 2 wheels;Grab bars - tub/shower     Prior Function   Level of Independence Independent   Vocation On disability   Vocation Requirements N/A   Leisure playing with grandkids     ROM / Strength   AROM / PROM / Strength Strength;AROM     AROM   Overall AROM Comments Painfull throughout all motions    Cervical Flexion 35   Cervical Extension 25   Cervical - Right Side Bend 30   Cervical - Left Side Bend 30   Cervical - Right Rotation 35   Cervical - Left Rotation 35     Strength   Strength Assessment Site Shoulder;Elbow;Forearm;Wrist   Right/Left Shoulder Right;Left   Right Shoulder  Flexion 4-/5   Right Shoulder ABduction 3+/5   Left Shoulder Flexion 4-/5   Left Shoulder ABduction 4-/5   Right/Left Elbow Right;Left   Right Elbow Flexion 3+/5   Right Elbow Extension 3+/5   Left Elbow Flexion 4-/5   Left Elbow Extension 4-/5   Right/Left Wrist Right;Left   Right Wrist Flexion 3+/5   Right Wrist Extension 3+/5   Left Wrist Flexion 4-/5   Left Wrist Extension 4-/5   Cervical Flexion 4-/5   Cervical Extension 3+/5   Cervical - Right Side Bend 4-/5   Cervical - Left Side Bend 3+/5   Cervical - Right Rotation 4-/5   Cervical - Left Rotation 4-/5     Palpation   Palpation comment Increased TTP and spasms along B UT's, levator scapulae, and scapular retractors      Special Tests    Special Tests Cervical   Cervical Tests Spurling's;Dictraction     Spurling's   Findings Positive   Side Left   Comment Positive finding radiating down the left side into the dorsal aspect of the hand. Right increased pain into the mid back     Distraction Test   Findngs Positive   Comment Slight decrease in pain after performance      Sensation: Diminished sensation along C5- C8 dermatomal patterns on the R   TREATMENT:  B shoulder ER with GTB -- x 10      PT Education - 08/21/16 1032    Education provided Yes   Education Details POC and expectations of therapy   Person(s) Educated Patient   Methods Explanation;Demonstration   Comprehension Verbalized understanding;Returned demonstration             PT Long Term Goals - 08/21/16 1632      PT LONG TERM GOAL #1   Title Pt will have <= 2/10 pain at rest on the VAS to cervical/thoracic pain to indicate improvement with cervical function to ability to turn head when driving.   Baseline 9/10 on VAS at rest   Time 8   Period Weeks   Status New     PT LONG TERM GOAL #2   Title Pt will be independent with HEP to continue benefits from therapy after discharge from PT.    Baseline Dependent with exercise  performance and progression   Time 8   Period Weeks   Status New     PT LONG TERM GOAL #3   Title Pt will improvement all UE and cervical MMT scores to 4-5/5 to  demonstrate significant improvement in cervical function and greater ability to raise head to look forward.    Baseline see eval.    Time 8   Period Weeks   Status New               Plan - 08/22/16 1619    Clinical Impression Statement Pt is 60 yo right hand dominant female with a history of fibromyalgia experiencing increased neck and mid back pain after a fall. Patient reports difficulty with use of UE and is unable to move her neck through any AROM without increase in pain. Patient demonstrates decreased cervical AROM/strength in all cervical motions indicating cervical dysfunction. Patient will benefit from further skilled therapy focused on improving current limitations to return to prior  level of function.    Rehab Potential Fair   Clinical Impairments Affecting Rehab Potential (-) high pain level (+) highly motivated    PT Frequency 2x / week   PT Duration 8 weeks   PT Treatment/Interventions Gait training;Stair training;Cryotherapy;Electrical Stimulation;Moist Heat;Traction;Ultrasound;Therapeutic activities;Therapeutic exercise;Balance training;Neuromuscular re-education;Patient/family education;Manual techniques;Passive range of motion   PT Next Visit Plan Progress strengthening       Patient will benefit from skilled therapeutic intervention in order to improve the following deficits and impairments:  Abnormal gait, Increased fascial restricitons, Impaired sensation, Pain, Increased muscle spasms, Decreased coordination, Decreased mobility, Impaired UE functional use, Decreased strength, Decreased endurance, Decreased activity tolerance, Difficulty walking, Decreased balance  Visit Diagnosis: Cervicalgia  Abnormal posture      G-Codes - 08-22-16 1639    Functional Assessment Tool Used MMT, AROM  measurements, VAS, clinical judgement   Functional Limitation Changing and maintaining body position   Changing and Maintaining Body Position Current Status [id number]) At least 60 percent but less than 80 percent impaired, limited or restricted   Changing and Maintaining Body Position Goal Status [id number]) At least 20 percent but less than 40 percent impaired, limited or restricted       Problem List Patient Active Problem List   Diagnosis Date Noted  . Back pain at L4-L5 level 10/30/2015  . DDD (degenerative disc disease), lumbosacral 10/30/2015  . Knee pain, bilateral 07/28/2015  . Primary osteoarthritis of both knees 07/28/2015    Blythe Stanford, PT DPT 08/22/16, 4:50 PM  [place] MAIN [facility] SERVICES [address] [place], [place], [address] Phone: [phone]   Fax:  [phone]  Name: Christine Higgins MRN: [id number] Date of Birth: 02-11-1956

## 2016-08-27 DIAGNOSIS — E7800 Pure hypercholesterolemia, unspecified: Secondary | ICD-10-CM | POA: Diagnosis not present

## 2016-08-27 DIAGNOSIS — E119 Type 2 diabetes mellitus without complications: Secondary | ICD-10-CM | POA: Diagnosis not present

## 2016-08-27 DIAGNOSIS — Z23 Encounter for immunization: Secondary | ICD-10-CM | POA: Diagnosis not present

## 2016-08-27 DIAGNOSIS — F325 Major depressive disorder, single episode, in full remission: Secondary | ICD-10-CM | POA: Diagnosis not present

## 2016-08-27 DIAGNOSIS — I700 Atherosclerosis of aorta: Secondary | ICD-10-CM | POA: Diagnosis not present

## 2016-08-27 DIAGNOSIS — Z6835 Body mass index (BMI) 35.0-35.9, adult: Secondary | ICD-10-CM | POA: Diagnosis not present

## 2016-08-28 DIAGNOSIS — L089 Local infection of the skin and subcutaneous tissue, unspecified: Secondary | ICD-10-CM | POA: Diagnosis not present

## 2016-08-28 DIAGNOSIS — J3489 Other specified disorders of nose and nasal sinuses: Secondary | ICD-10-CM | POA: Diagnosis not present

## 2016-08-28 DIAGNOSIS — S0032XA Blister (nonthermal) of nose, initial encounter: Secondary | ICD-10-CM | POA: Diagnosis not present

## 2016-08-29 ENCOUNTER — Ambulatory Visit: Payer: Medicare HMO | Attending: Physical Medicine and Rehabilitation | Admitting: Physical Therapy

## 2016-08-29 DIAGNOSIS — M542 Cervicalgia: Secondary | ICD-10-CM | POA: Insufficient documentation

## 2016-08-29 DIAGNOSIS — R293 Abnormal posture: Secondary | ICD-10-CM | POA: Insufficient documentation

## 2016-09-03 ENCOUNTER — Ambulatory Visit: Payer: Medicare HMO | Admitting: Physical Therapy

## 2016-09-05 ENCOUNTER — Ambulatory Visit: Payer: Commercial Managed Care - HMO | Admitting: Physical Therapy

## 2016-09-05 ENCOUNTER — Ambulatory Visit: Payer: Medicare HMO | Admitting: Physical Therapy

## 2016-09-05 DIAGNOSIS — R293 Abnormal posture: Secondary | ICD-10-CM

## 2016-09-05 DIAGNOSIS — M542 Cervicalgia: Secondary | ICD-10-CM

## 2016-09-06 NOTE — Therapy (Signed)
[place] MAIN [facility] SERVICES [address] [place], [place], [address] Phone: [phone]   Fax:  [phone]  Physical Therapy Treatment  Patient Details  Name: Christine Higgins MRN: [id number] Date of Birth: Feb 24, 1956 Referring Provider: Dr. Sharlet Salina  Encounter Date: 09/05/2016      PT End of Session - 09/06/16 1131    Visit Number 2   Number of Visits 16   Date for PT Re-Evaluation 10/16/16   Authorization Type 2/10 G Code   PT Start Time 0820   PT Stop Time 0910   PT Time Calculation (min) 50 min   Activity Tolerance Patient limited by pain   Behavior During Therapy [facility] for tasks assessed/performed      Past Medical History:  Diagnosis Date  . Degenerative disc disease, lumbar   . Fibromyalgia   . Graves disease   . Hyperlipidemia   . Hypertension   . Thyroid disease     Past Surgical History:  Procedure Laterality Date  . BACK SURGERY     sumbar  . carpel tunn Right   . carpel tunnel Left   . CESAREAN SECTION    . HAND SURGERY Right    scar tissue removal  . NECK SURGERY     "disk implant"  . SHOULDER SURGERY Right    spur removal  . TOTAL HIP ARTHROPLASTY Right     There were no vitals filed for this visit.      Subjective Assessment - 09/06/16 1129    Subjective Pt reports she feels her pain in her midback radiating up to her neck in addition to bilateral radiating low back pain that moves across the shins. Pt expressed how her pain has impacted her ability to hold her grandchild and playing with her older grandchild. Her grandchildren bring much joy to her life.    Limitations Lifting;Standing   How long can you stand comfortably? 2 min   How long can you walk comfortably? 48min    Patient Stated Goals Wants to be able to use UE's more and improve neck function.   Pain Onset More than a month ago                     Adult Aquatic Therapy - 09/06/16 1131      Aquatic Therapy Subjective   Subjective Pt reported no increased pain with exercises. Pt reported no pain following session        O: Pt entered/exited the pool via steps with single UE support on rail. Cued for 45deg turn for stairclimbing and narrower BOS while descending   50 ft =1 lap  Exercises performed in 3'6" depth   Stretches :  ROM of each joint at cervical and lumbar spine  2 laps with BUE on noodles: forward walking with cues for feet propioception with CGA  to decrease pt's fear of the water 2 laps floating on noodles, guided pt to perform angel wings for shoulder abd/ad 0-~60 deg to induce relaxation of midback mm   10 reps of mini squats    Relaxation on noodles under head, armpits, midback, posterior thighs pulled by PT across 2 laps.   Guided pt on transitioning to stand with minimal strain on neck and back.    PT lowered cane to fit pt's height, cued for wider BOS when walking on land.  PT educated pt on sit to stand technique to decrease back pain.  PT Long Term Goals - 08/21/16 1632      PT LONG TERM GOAL #1   Title Pt will have <= 2/10 pain at rest on the VAS to cervical/thoracic pain to indicate improvement with cervical function to ability to turn head when driving.   Baseline 9/10 on VAS at rest   Time 8   Period Weeks   Status New     PT LONG TERM GOAL #2   Title Pt will be independent with HEP to continue benefits from therapy after discharge from PT.    Baseline Dependent with exercise performance and progression   Time 8   Period Weeks   Status New     PT LONG TERM GOAL #3   Title Pt will improvement all UE and cervical MMT scores to 4-5/5 to demonstrate significant improvement in cervical function and greater ability to raise head to look forward.    Baseline see eval.    Time 8   Period Weeks   Status New               Plan - 09/06/16 1131    Clinical Impression Statement Exercises were designed to not increase her LBP  radiating pain while promoting ease to her midback pain. Pt tolerated with report of centralization of her LBP and the only pain remaining following her session was located in her midback.  Pt appeared less fearful of the water by the end of the session. Following the floating activity with guided mindfulness, she expressed with joyful tears how she  felt "lighter" and "freer", which was a feeling she had not felt since the onset of her pain. Gait on land improved with cuing and adjustment of her cane.   Pt benefited from skilled PT.    Rehab Potential Fair   Clinical Impairments Affecting Rehab Potential (-) high pain level (+) highly motivated    PT Frequency 2x / week   PT Duration 8 weeks   PT Treatment/Interventions Gait training;Stair training;Cryotherapy;Electrical Stimulation;Moist Heat;Traction;Ultrasound;Therapeutic activities;Therapeutic exercise;Balance training;Neuromuscular re-education;Patient/family education;Manual techniques;Passive range of motion   PT Next Visit Plan Progress strengthening       Patient will benefit from skilled therapeutic intervention in order to improve the following deficits and impairments:  Abnormal gait, Increased fascial restricitons, Impaired sensation, Pain, Increased muscle spasms, Decreased coordination, Decreased mobility, Impaired UE functional use, Decreased strength, Decreased endurance, Decreased activity tolerance, Difficulty walking, Decreased balance  Visit Diagnosis: Cervicalgia  Abnormal posture     Problem List Patient Active Problem List   Diagnosis Date Noted  . Back pain at L4-L5 level 10/30/2015  . DDD (degenerative disc disease), lumbosacral 10/30/2015  . Knee pain, bilateral 07/28/2015  . Primary osteoarthritis of both knees 07/28/2015    Jerl Mina ,PT, DPT, E-RYT  09/06/2016, 11:39 AM  [place] MAIN [facility] SERVICES [address] [place], [place], [address] Phone:  [phone]   Fax:  [phone]  Name: Christine Higgins MRN: [id number] Date of Birth: [birth year]

## 2016-09-12 ENCOUNTER — Ambulatory Visit: Payer: Medicare HMO | Admitting: Physical Therapy

## 2016-09-17 DIAGNOSIS — E0500 Thyrotoxicosis with diffuse goiter without thyrotoxic crisis or storm: Secondary | ICD-10-CM | POA: Diagnosis not present

## 2016-09-19 ENCOUNTER — Ambulatory Visit: Payer: Commercial Managed Care - HMO | Admitting: Physical Therapy

## 2016-09-20 DIAGNOSIS — E042 Nontoxic multinodular goiter: Secondary | ICD-10-CM | POA: Diagnosis not present

## 2016-09-20 DIAGNOSIS — E0500 Thyrotoxicosis with diffuse goiter without thyrotoxic crisis or storm: Secondary | ICD-10-CM | POA: Diagnosis not present

## 2016-09-23 DIAGNOSIS — M5416 Radiculopathy, lumbar region: Secondary | ICD-10-CM | POA: Diagnosis not present

## 2016-09-23 DIAGNOSIS — M5030 Other cervical disc degeneration, unspecified cervical region: Secondary | ICD-10-CM | POA: Diagnosis not present

## 2016-09-23 DIAGNOSIS — M5136 Other intervertebral disc degeneration, lumbar region: Secondary | ICD-10-CM | POA: Diagnosis not present

## 2016-09-23 DIAGNOSIS — M62838 Other muscle spasm: Secondary | ICD-10-CM | POA: Diagnosis not present

## 2016-09-26 ENCOUNTER — Ambulatory Visit: Payer: Medicare HMO | Attending: Physical Medicine and Rehabilitation | Admitting: Physical Therapy

## 2016-09-26 DIAGNOSIS — R293 Abnormal posture: Secondary | ICD-10-CM | POA: Diagnosis not present

## 2016-09-26 DIAGNOSIS — M542 Cervicalgia: Secondary | ICD-10-CM | POA: Insufficient documentation

## 2016-09-26 NOTE — Therapy (Signed)
[place] MAIN [facility] SERVICES [address] [place], [place], [address] Phone: [phone]   Fax:  [phone]  Physical Therapy Treatment  Patient Details  Name: Christine Higgins MRN: [id number] Date of Birth: 04-14-1956 Referring Provider: Dr. Sharlet Salina  Encounter Date: 09/26/2016      PT End of Session - 09/26/16 0933    Visit Number 3   Number of Visits 16   Date for PT Re-Evaluation 10/16/16   Authorization Type 3/10 G Code   PT Start Time 0830   PT Stop Time 0920   PT Time Calculation (min) 50 min      Past Medical History:  Diagnosis Date  . Degenerative disc disease, lumbar   . Fibromyalgia   . Graves disease   . Hyperlipidemia   . Hypertension   . Thyroid disease     Past Surgical History:  Procedure Laterality Date  . BACK SURGERY     sumbar  . carpel tunn Right   . carpel tunnel Left   . CESAREAN SECTION    . HAND SURGERY Right    scar tissue removal  . NECK SURGERY     "disk implant"  . SHOULDER SURGERY Right    spur removal  . TOTAL HIP ARTHROPLASTY Right     There were no vitals filed for this visit.      Subjective Assessment - 09/26/16 0923    Subjective Pt reports she has radiating pain down her LLE at 9/10 and along her L arm this morning. Pt saw Dr. Sharlet Salina and he told her he would like for her to keep doing aquatic Tx.    Limitations Lifting;Standing   How long can you stand comfortably? 2 min   How long can you walk comfortably? 103min    Patient Stated Goals Wants to be able to use UE's more and improve neck function.   Pain Onset More than a month ago                     Adult Aquatic Therapy - 09/26/16 0933      Aquatic Therapy Subjective   Subjective Pt reported no increased pain with exercises. Pt reported decreased pain form 9/10 to 4/10 wiith pain only at the upper thigh            O: Pt entered/exited the pool via steps with single UE support on rail.  50 ft =1  lap  Exercises performed in 3'6" depth   Stretches : (Cuing provided for proper alignment) ROM of each joint wall stretch hip flexor stretch on step L leg only, (R leg caused radiating pain)   Refined cues for hip ext and abd with single UE on wall. 10 reps each side . Cued for decrease lumbar lordosis (HEP)   4 laps with BUE on noodles, marching, feet propioception 2 laps seated on noodle to facilaite posterior tilt of pelvic, side stepping L/R   5 breaths wall squats, 30 reps standing marching (HEP)   Relaxation by mini squat leaning against wall  5' not charged                      PT Long Term Goals - 08/21/16 1632      PT LONG TERM GOAL #1   Title Pt will have <= 2/10 pain at rest on the VAS to cervical/thoracic pain to indicate improvement with cervical function to ability to turn head when driving.  Baseline 9/10 on VAS at rest   Time 8   Period Weeks   Status New     PT LONG TERM GOAL #2   Title Pt will be independent with HEP to continue benefits from therapy after discharge from PT.    Baseline Dependent with exercise performance and progression   Time 8   Period Weeks   Status New     PT LONG TERM GOAL #3   Title Pt will improvement all UE and cervical MMT scores to 4-5/5 to demonstrate significant improvement in cervical function and greater ability to raise head to look forward.    Baseline see eval.    Time 8   Period Weeks   Status New               Plan - 09/26/16 [id number]    Clinical Impression Statement Following her session today, pt reported 40% improvement with the tingling sensation down her L arm and reported her radiating pain down her L leg has moved from her foot to the front mid thigh. Incorporated HEP and neuromuscular reeducation to decrease lumbar lordosis which has helped to centralize her pain. Pt voiced and demo'd IND with HEP. Pt continues to benefit from skilled PT and has been scheduled for more aquatic Tx  throughout the next two months.    Rehab Potential Fair   Clinical Impairments Affecting Rehab Potential (-) high pain level (+) highly motivated    PT Frequency 2x / week   PT Duration 8 weeks   PT Treatment/Interventions Gait training;Stair training;Cryotherapy;Electrical Stimulation;Moist Heat;Traction;Ultrasound;Therapeutic activities;Therapeutic exercise;Balance training;Neuromuscular re-education;Patient/family education;Manual techniques;Passive range of motion   PT Next Visit Plan Progress strengthening       Patient will benefit from skilled therapeutic intervention in order to improve the following deficits and impairments:  Abnormal gait, Increased fascial restricitons, Impaired sensation, Pain, Increased muscle spasms, Decreased coordination, Decreased mobility, Impaired UE functional use, Decreased strength, Decreased endurance, Decreased activity tolerance, Difficulty walking, Decreased balance  Visit Diagnosis: Cervicalgia  Abnormal posture     Problem List Patient Active Problem List   Diagnosis Date Noted  . Back pain at L4-L5 level 10/30/2015  . DDD (degenerative disc disease), lumbosacral 10/30/2015  . Knee pain, bilateral 07/28/2015  . Primary osteoarthritis of both knees 07/28/2015    Jerl Mina ,PT, DPT, E-RYT  09/26/2016, 10:00 AM  [place] MAIN [facility] SERVICES [address] [place], [place], [address] Phone: [phone]   Fax:  [phone]  Name: LITIA TADESSE MRN: [id number] Date of Birth: May 10, 1956

## 2016-10-01 DIAGNOSIS — B351 Tinea unguium: Secondary | ICD-10-CM | POA: Diagnosis not present

## 2016-10-01 DIAGNOSIS — M79672 Pain in left foot: Secondary | ICD-10-CM | POA: Diagnosis not present

## 2016-10-01 DIAGNOSIS — M79671 Pain in right foot: Secondary | ICD-10-CM | POA: Diagnosis not present

## 2016-10-01 DIAGNOSIS — G5762 Lesion of plantar nerve, left lower limb: Secondary | ICD-10-CM | POA: Diagnosis not present

## 2016-10-03 ENCOUNTER — Ambulatory Visit: Payer: Medicare HMO | Admitting: Physical Therapy

## 2016-10-03 DIAGNOSIS — R293 Abnormal posture: Secondary | ICD-10-CM | POA: Diagnosis not present

## 2016-10-03 DIAGNOSIS — M542 Cervicalgia: Secondary | ICD-10-CM | POA: Diagnosis not present

## 2016-10-04 NOTE — Therapy (Addendum)
[place] MAIN [facility] SERVICES [address] [place], [place], [address] Phone: [phone]   Fax:  [phone]  Physical Therapy Treatment  Patient Details  Name: Christine Higgins MRN: [id number] Date of Birth: [birth year] Referring Provider: Dr. Sharlet Salina  Encounter Date: 10/03/2016      PT End of Session - 10/04/16 0937    Visit Number 4   Number of Visits 16   Date for PT Re-Evaluation 10/16/16   Authorization Type 4/10 G Code   PT Start Time 0830   PT Stop Time 0910   PT Time Calculation (min) 40 min   Activity Tolerance Patient tolerated treatment well;No increased pain   Behavior During Therapy WFL for tasks assessed/performed      Past Medical History:  Diagnosis Date  . Degenerative disc disease, lumbar   . Fibromyalgia   . Graves disease   . Hyperlipidemia   . Hypertension   . Thyroid disease     Past Surgical History:  Procedure Laterality Date  . BACK SURGERY     sumbar  . carpel tunn Right   . carpel tunnel Left   . CESAREAN SECTION    . HAND SURGERY Right    scar tissue removal  . NECK SURGERY     "disk implant"  . SHOULDER SURGERY Right    spur removal  . TOTAL HIP ARTHROPLASTY Right     There were no vitals filed for this visit.      Subjective Assessment - 10/04/16 0936    Subjective Pt reports she has been scheduled for more appts. Pt will be getting a cortisone shot and feels that the shot and the pool therapy will help her   Limitations Lifting;Standing   How long can you stand comfortably? 2 min   How long can you walk comfortably? 75min    Patient Stated Goals Wants to be able to use UE's more and improve neck function.   Pain Onset More than a month ago                     Adult Aquatic Therapy - 10/04/16 0937      Aquatic Therapy Subjective   Subjective Pt reported no increased pain with exercises.           O: Pt entered/exited the pool via steps with single UE support on  rail.  50 ft =1 lap  Exercises performed in 3'6" depth   Stretches : (Cuing provided for proper alignment) ROM of each joint wall stretch Mini squat-figure 4 (piriformis) hip flexor stretch on step hip flexor twist on step 10 reps mini-squat    4 laps walking with noodles in BUE like a parallel bars rather than in front, walking forward . Less LOB, postural stability improved, confidence in the water increased  4 laps sidestepping with mini squat    Relaxation on noodles under head, armpits, midback, posterior thighs pulled by PT across 2 laps.   Guided pt on transitioning to stand with minimal strain on neck and back.                     PT Long Term Goals - 10/04/16 [id number]      PT LONG TERM GOAL #1   Title Pt will have <= 2/10 pain at rest on the VAS to cervical/thoracic pain to indicate improvement with cervical function to ability to turn head when driving.   Baseline 9/10 on VAS at  rest   Time 8   Period Weeks   Status On-going     PT LONG TERM GOAL #2   Title Pt will be independent with HEP to continue benefits from therapy after discharge from PT.    Baseline Dependent with exercise performance and progression   Time 8   Period Weeks   Status On-going     PT LONG TERM GOAL #3   Title Pt will improvement all UE and cervical MMT scores to 4-5/5 to demonstrate significant improvement in cervical function and greater ability to raise head to look forward.    Baseline see eval.    Time 8   Period Weeks   Status On-going               Plan - 10/04/16 [id number]    Clinical Impression Statement Pt demo'd today increased postural control with gait trainingckward walking with BUE support at her sides instead in her front body. Pt reported no pain with exercises. Pt is progressing towards her goals with skilled PT.    Rehab Potential Fair   Clinical Impairments Affecting Rehab Potential (-) high pain level (+) highly motivated    PT Frequency 2x /  week   PT Duration 8 weeks   PT Treatment/Interventions Gait training;Stair training;Cryotherapy;Electrical Stimulation;Moist Heat;Traction;Ultrasound;Therapeutic activities;Therapeutic exercise;Balance training;Neuromuscular re-education;Patient/family education;Manual techniques;Passive range of motion   PT Next Visit Plan Progress strengthening       Patient will benefit from skilled therapeutic intervention in order to improve the following deficits and impairments:  Abnormal gait, Increased fascial restricitons, Impaired sensation, Pain, Increased muscle spasms, Decreased coordination, Decreased mobility, Impaired UE functional use, Decreased strength, Decreased endurance, Decreased activity tolerance, Difficulty walking, Decreased balance  Visit Diagnosis: Cervicalgia  Abnormal posture     Problem List Patient Active Problem List   Diagnosis Date Noted  . Back pain at L4-L5 level 10/30/2015  . DDD (degenerative disc disease), lumbosacral 10/30/2015  . Knee pain, bilateral 07/28/2015  . Primary osteoarthritis of both knees 07/28/2015    Jerl Mina ,PT, DPT, E-RYT  10/04/2016, 9:39 AM  [place] MAIN [facility] SERVICES [address] [place], [place], [address] Phone: [phone]   Fax:  [phone]  Name: Christine Higgins MRN: [id number] Date of Birth: May 01, 1956

## 2016-10-10 ENCOUNTER — Ambulatory Visit: Payer: Medicare HMO | Admitting: Physical Therapy

## 2016-10-14 ENCOUNTER — Ambulatory Visit (INDEPENDENT_AMBULATORY_CARE_PROVIDER_SITE_OTHER): Payer: Medicare HMO

## 2016-10-14 ENCOUNTER — Ambulatory Visit (INDEPENDENT_AMBULATORY_CARE_PROVIDER_SITE_OTHER): Payer: Medicare HMO | Admitting: Vascular Surgery

## 2016-10-14 ENCOUNTER — Encounter (INDEPENDENT_AMBULATORY_CARE_PROVIDER_SITE_OTHER): Payer: Self-pay | Admitting: Vascular Surgery

## 2016-10-14 ENCOUNTER — Other Ambulatory Visit (INDEPENDENT_AMBULATORY_CARE_PROVIDER_SITE_OTHER): Payer: Self-pay | Admitting: Vascular Surgery

## 2016-10-14 VITALS — BP 141/60 | HR 90 | Resp 17 | Wt 266.0 lb

## 2016-10-14 DIAGNOSIS — I739 Peripheral vascular disease, unspecified: Secondary | ICD-10-CM

## 2016-10-14 DIAGNOSIS — M170 Bilateral primary osteoarthritis of knee: Secondary | ICD-10-CM | POA: Diagnosis not present

## 2016-10-14 DIAGNOSIS — M545 Low back pain, unspecified: Secondary | ICD-10-CM

## 2016-10-14 DIAGNOSIS — M5137 Other intervertebral disc degeneration, lumbosacral region: Secondary | ICD-10-CM

## 2016-10-14 DIAGNOSIS — M7989 Other specified soft tissue disorders: Secondary | ICD-10-CM | POA: Diagnosis not present

## 2016-10-14 DIAGNOSIS — M79604 Pain in right leg: Secondary | ICD-10-CM

## 2016-10-14 NOTE — Progress Notes (Signed)
MRN : [id number]  Christine Higgins is a 61 y.o. ([birth year]) female who presents with chief complaint of  Chief Complaint  Patient presents with  . Follow-up  .  History of Present Illness: The patient is seen for evaluation of symptomatic varicose veins. The patient relates burning and stinging which worsened steadily throughout the course of the day, particularly with standing. The patient also notes an aching and throbbing pain over the varicosities, particularly with prolonged dependent positions. The symptoms are significantly improved with elevation.  The patient also notes that during hot weather the symptoms are greatly intensified. The patient states the pain from the varicose veins interferes with work, daily exercise, shopping and household maintenance. At this point, the symptoms are persistent and severe enough that they're having a negative impact on lifestyle and are interfering with daily activities.  There is no history of DVT, PE or superficial thrombophlebitis. There is no history of ulceration or hemorrhage. The patient denies a significant family history of varicose veins.  The patient has not worn graduated compression in the past. At the present time the patient has not been using over-the-counter analgesics. There is no history of prior surgical intervention or sclerotherapy.    Current Meds  Medication Sig  . acetaminophen (TYLENOL) 500 MG tablet Take 500 mg by mouth 2 (two) times daily. 2 tablets  . Ascorbic Acid (VITAMIN C) 1000 MG tablet Take 1,000 mg by mouth every other day.  Marland Kitchen atorvastatin (LIPITOR) 10 MG tablet Take 10 mg by mouth daily.  . Calcium-Magnesium-Vitamin D (CALCIUM 1200+D3 PO) Take by mouth every other day.  . clobetasol cream (TEMOVATE) [id number] % Apply 1 application topically 2 (two) times daily.  . clonazePAM (KLONOPIN) 1 MG tablet Take 1 mg by mouth daily.  . clotrimazole-betamethasone (LOTRISONE) cream Apply 1 application topically 2 (two)  times daily.  . DULoxetine (CYMBALTA) 60 MG capsule Take 60 mg by mouth daily.  Marland Kitchen esomeprazole (NEXIUM) 40 MG capsule Take 40 mg by mouth 2 (two) times daily.  . Linaclotide (LINZESS) 290 MCG CAPS capsule Take 290 mcg by mouth daily.  Marland Kitchen losartan (COZAAR) 50 MG tablet Take 50 mg by mouth daily.  . meloxicam (MOBIC) 7.5 MG tablet Take 1 tablet (7.5 mg total) by mouth 2 (two) times daily after a meal.  . methimazole (TAPAZOLE) 10 MG tablet Take 10 mg by mouth daily. 3 tablets  . Multiple Vitamin (MULTIVITAMIN) capsule Take 1 capsule by mouth every other day.  . Omega-3 Fatty Acids (FISH OIL) 1000 MG CAPS Take by mouth every other day. Reported on 10/30/2015  . tiZANidine (ZANAFLEX) 4 MG capsule Take 4 mg by mouth 3 (three) times daily.  . traZODone (DESYREL) 150 MG tablet Take by mouth at bedtime.  . triamterene-hydrochlorothiazide (MAXZIDE) 75-50 MG tablet Take 1 tablet by mouth daily.  . vitamin B-12 (CYANOCOBALAMIN) 100 MCG tablet Take 100 mcg by mouth every other day.  . vitamin E 100 UNIT capsule Take by mouth every other day.   Current Facility-Administered Medications for the 10/14/16 encounter (Office Visit) with Katha Cabal, MD  Medication  . bupivacaine (PF) (MARCAINE) 0.25 % injection 20 mL  . bupivacaine (PF) (MARCAINE) 0.25 % injection 20 mL  . fentaNYL (SUBLIMAZE) injection 50 mcg  . fentaNYL (SUBLIMAZE) injection 50 mcg  . iohexol (OMNIPAQUE) 180 MG/ML injection 20 mL  . iohexol (OMNIPAQUE) 180 MG/ML injection 20 mL  . midazolam (VERSED) 5 MG/5ML injection 1 mg  . midazolam (VERSED) 5 MG/5ML  injection 1 mg  . triamcinolone acetonide (KENALOG-40) injection 80 mg  . triamcinolone acetonide (KENALOG-40) injection 80 mg    Past Medical History:  Diagnosis Date  . Degenerative disc disease, lumbar   . Fibromyalgia   . Graves disease   . Hyperlipidemia   . Hypertension   . Thyroid disease     Past Surgical History:  Procedure Laterality Date  . BACK SURGERY      sumbar  . carpel tunn Right   . carpel tunnel Left   . CESAREAN SECTION    . HAND SURGERY Right    scar tissue removal  . NECK SURGERY     "disk implant"  . SHOULDER SURGERY Right    spur removal  . TOTAL HIP ARTHROPLASTY Right     Social History Social History  Substance Use Topics  . Smoking status: Former Research scientist (life sciences)  . Smokeless tobacco: Never Used  . Alcohol use No    Family History Family History  Problem Relation Age of Onset  . Cancer Mother   . Heart disease Father   No family history of bleeding/clotting disorders, porphyria or autoimmune disease   Allergies  Allergen Reactions  . Aspirin Nausea And Vomiting     REVIEW OF SYSTEMS (Negative unless checked)  Constitutional: [] Weight loss  [] Fever  [] Chills Cardiac: [] Chest pain   [] Chest pressure   [] Palpitations   [] Shortness of breath when laying flat   [] Shortness of breath with exertion. Vascular:  [] Pain in legs with walking   [] Pain in legs at rest  [] History of DVT   [] Phlebitis   [x] Swelling in legs   [x] Varicose veins   [] Non-healing ulcers Pulmonary:   [] Uses home oxygen   [] Productive cough   [] Hemoptysis   [] Wheeze  [] COPD   [] Asthma Neurologic:  [] Dizziness   [] Seizures   [] History of stroke   [] History of TIA  [] Aphasia   [] Vissual changes   [] Weakness or numbness in arm   [] Weakness or numbness in leg Musculoskeletal:   [] Joint swelling   [] Joint pain   [] Low back pain Hematologic:  [] Easy bruising  [] Easy bleeding   [] Hypercoagulable state   [] Anemic Gastrointestinal:  [] Diarrhea   [] Vomiting  [] Gastroesophageal reflux/heartburn   [] Difficulty swallowing. Genitourinary:  [] Chronic kidney disease   [] Difficult urination  [] Frequent urination   [] Blood in urine Skin:  [] Rashes   [] Ulcers  Psychological:  [] History of anxiety   []  History of major depression.  Physical Examination  Vitals:   10/14/16 1551  BP: (!) 141/60  Pulse: 90  Resp: 17  Weight: 266 lb (120.7 kg)   Body mass index is 37.1  kg/m. Gen: WD/WN, NAD Head: [place]/AT, No temporalis wasting.  Ear/Nose/Throat: Hearing grossly intact, nares w/o erythema or drainage, poor dentition Eyes: PER, EOMI, sclera nonicteric.  Neck: Supple, no masses.  No bruit or JVD.  Pulmonary:  Good air movement, clear to auscultation bilaterally, no use of accessory muscles.  Cardiac: RRR, normal S1, S2, no Murmurs. Vascular: scattered varicose veins with mild skin changes Vessel Right Left  Radial Palpable Palpable  Ulnar Palpable Palpable  Brachial Palpable Palpable  Carotid Palpable Palpable  Femoral Palpable Palpable  Popliteal Palpable Palpable  PT Palpable Palpable  DP Palpable Palpable   Gastrointestinal: soft, non-distended. No guarding/no peritoneal signs.  Musculoskeletal: M/S 5/5 throughout.  No deformity or atrophy.  Neurologic: CN 2-12 intact. Pain and light touch intact in extremities.  Symmetrical.  Speech is fluent. Motor exam as listed above. Psychiatric: Judgment intact, Mood &  affect appropriate for pt's clinical situation. Dermatologic: No rashes or ulcers noted.  No changes consistent with cellulitis. Lymph : No Cervical lymphadenopathy, no lichenification or skin changes of chronic lymphedema.  CBC Lab Results  Component Value Date   WBC 3.9 06/09/2016   HGB 12.9 06/09/2016   HCT 38.4 06/09/2016   MCV 81.1 06/09/2016   PLT 135 (L) 06/09/2016    BMET    Component Value Date/Time   NA 137 06/09/2016 1043   NA 132 (L) 08/26/2014 0458   K 3.1 (L) 06/09/2016 1043   K 3.8 08/28/2014 0359   CL 102 06/09/2016 1043   CL 95 (L) 08/26/2014 0458   CO2 28 06/09/2016 1043   CO2 31 08/26/2014 0458   GLUCOSE 113 (H) 06/09/2016 1043   GLUCOSE 155 (H) 08/26/2014 0458   BUN 12 06/09/2016 1043   BUN 5 (L) 08/26/2014 0458   CREATININE 0.76 06/09/2016 1043   CREATININE 0.74 08/26/2014 0458   CALCIUM 9.6 06/09/2016 1043   CALCIUM 8.6 08/26/2014 0458   GFRNONAA >60 06/09/2016 1043   GFRNONAA >60 08/26/2014 0458    GFRAA >60 06/09/2016 1043   GFRAA >60 08/26/2014 0458   CrCl cannot be calculated (Patient's most recent lab result is older than the maximum 21 days allowed.).  COAG Lab Results  Component Value Date   INR 0.9 08/10/2014    Radiology No results found.   Assessment/Plan 1. Pain of right lower extremity Recommend:  I do not find evidence of Vascular pathology that would explain the patient's symptoms  The patient has atypical pain symptoms for vascular disease  Noninvasive studies including venous ultrasound of the legs do not identify vascular problems  The patient should continue walking and begin a more formal exercise program. The patient should continue his antiplatelet therapy and aggressive treatment of the lipid abnormalities. The patient should begin wearing graduated compression socks 15-20 mmHg strength to control her mild edema.  Patient will follow-up with me on a PRN basis  Further work-up of her lower extremity pain is deferred to the primary service     2. DDD (degenerative disc disease), lumbosacral See #1  3. Primary osteoarthritis of both knees See #1  4. Back pain at L4-L5 level See#1    Hortencia Pilar, MD  10/14/2016 9:34 PM

## 2016-10-15 ENCOUNTER — Ambulatory Visit: Payer: Self-pay | Admitting: Physical Therapy

## 2016-10-16 DIAGNOSIS — M5416 Radiculopathy, lumbar region: Secondary | ICD-10-CM | POA: Diagnosis not present

## 2016-10-16 DIAGNOSIS — M5136 Other intervertebral disc degeneration, lumbar region: Secondary | ICD-10-CM | POA: Diagnosis not present

## 2016-10-18 DIAGNOSIS — M170 Bilateral primary osteoarthritis of knee: Secondary | ICD-10-CM | POA: Diagnosis not present

## 2016-10-18 DIAGNOSIS — M1711 Unilateral primary osteoarthritis, right knee: Secondary | ICD-10-CM | POA: Insufficient documentation

## 2016-10-18 DIAGNOSIS — M1712 Unilateral primary osteoarthritis, left knee: Secondary | ICD-10-CM | POA: Insufficient documentation

## 2016-10-18 DIAGNOSIS — M25561 Pain in right knee: Secondary | ICD-10-CM | POA: Diagnosis not present

## 2016-10-18 DIAGNOSIS — M25562 Pain in left knee: Secondary | ICD-10-CM | POA: Diagnosis not present

## 2016-10-21 ENCOUNTER — Ambulatory Visit: Payer: Medicare HMO | Admitting: Physical Therapy

## 2016-10-28 ENCOUNTER — Encounter: Payer: Self-pay | Admitting: Physical Therapy

## 2016-10-29 ENCOUNTER — Ambulatory Visit: Payer: Self-pay | Admitting: Physical Therapy

## 2016-10-29 DIAGNOSIS — L0231 Cutaneous abscess of buttock: Secondary | ICD-10-CM | POA: Diagnosis not present

## 2016-10-29 DIAGNOSIS — E119 Type 2 diabetes mellitus without complications: Secondary | ICD-10-CM | POA: Diagnosis not present

## 2016-10-31 ENCOUNTER — Ambulatory Visit: Payer: Medicare HMO | Admitting: Physical Therapy

## 2016-11-07 ENCOUNTER — Ambulatory Visit: Payer: Medicare HMO | Admitting: Physical Therapy

## 2016-11-14 ENCOUNTER — Ambulatory Visit: Payer: Self-pay | Admitting: Physical Therapy

## 2016-11-14 DIAGNOSIS — Z09 Encounter for follow-up examination after completed treatment for conditions other than malignant neoplasm: Secondary | ICD-10-CM | POA: Diagnosis not present

## 2016-11-14 DIAGNOSIS — E0500 Thyrotoxicosis with diffuse goiter without thyrotoxic crisis or storm: Secondary | ICD-10-CM | POA: Diagnosis not present

## 2016-11-14 DIAGNOSIS — E119 Type 2 diabetes mellitus without complications: Secondary | ICD-10-CM | POA: Diagnosis not present

## 2016-11-14 DIAGNOSIS — E042 Nontoxic multinodular goiter: Secondary | ICD-10-CM | POA: Diagnosis not present

## 2016-11-14 DIAGNOSIS — A4902 Methicillin resistant Staphylococcus aureus infection, unspecified site: Secondary | ICD-10-CM | POA: Diagnosis not present

## 2016-11-14 DIAGNOSIS — B373 Candidiasis of vulva and vagina: Secondary | ICD-10-CM | POA: Diagnosis not present

## 2016-11-19 DIAGNOSIS — M5030 Other cervical disc degeneration, unspecified cervical region: Secondary | ICD-10-CM | POA: Diagnosis not present

## 2016-11-19 DIAGNOSIS — M5136 Other intervertebral disc degeneration, lumbar region: Secondary | ICD-10-CM | POA: Diagnosis not present

## 2016-11-19 DIAGNOSIS — G5601 Carpal tunnel syndrome, right upper limb: Secondary | ICD-10-CM | POA: Diagnosis not present

## 2016-11-19 DIAGNOSIS — M5412 Radiculopathy, cervical region: Secondary | ICD-10-CM | POA: Diagnosis not present

## 2016-11-19 DIAGNOSIS — M62838 Other muscle spasm: Secondary | ICD-10-CM | POA: Diagnosis not present

## 2016-11-19 DIAGNOSIS — M5416 Radiculopathy, lumbar region: Secondary | ICD-10-CM | POA: Diagnosis not present

## 2016-11-21 ENCOUNTER — Ambulatory Visit: Payer: Self-pay | Admitting: Physical Therapy

## 2016-12-03 DIAGNOSIS — G5762 Lesion of plantar nerve, left lower limb: Secondary | ICD-10-CM | POA: Diagnosis not present

## 2016-12-05 DIAGNOSIS — K2960 Other gastritis without bleeding: Secondary | ICD-10-CM | POA: Diagnosis not present

## 2016-12-05 DIAGNOSIS — R1013 Epigastric pain: Secondary | ICD-10-CM | POA: Diagnosis not present

## 2016-12-14 ENCOUNTER — Encounter: Payer: Self-pay | Admitting: Emergency Medicine

## 2016-12-14 ENCOUNTER — Emergency Department: Payer: Medicare HMO

## 2016-12-14 ENCOUNTER — Emergency Department
Admission: EM | Admit: 2016-12-14 | Discharge: 2016-12-14 | Disposition: A | Discharge status: Home / Self Care | Payer: Medicare HMO | Attending: Emergency Medicine | Admitting: Emergency Medicine

## 2016-12-14 DIAGNOSIS — M1990 Unspecified osteoarthritis, unspecified site: Secondary | ICD-10-CM | POA: Diagnosis not present

## 2016-12-14 DIAGNOSIS — I10 Essential (primary) hypertension: Secondary | ICD-10-CM | POA: Insufficient documentation

## 2016-12-14 DIAGNOSIS — Z79899 Other long term (current) drug therapy: Secondary | ICD-10-CM | POA: Insufficient documentation

## 2016-12-14 DIAGNOSIS — Z87891 Personal history of nicotine dependence: Secondary | ICD-10-CM | POA: Insufficient documentation

## 2016-12-14 DIAGNOSIS — M5416 Radiculopathy, lumbar region: Secondary | ICD-10-CM | POA: Diagnosis not present

## 2016-12-14 DIAGNOSIS — R1033 Periumbilical pain: Secondary | ICD-10-CM | POA: Diagnosis present

## 2016-12-14 DIAGNOSIS — M4726 Other spondylosis with radiculopathy, lumbar region: Secondary | ICD-10-CM | POA: Diagnosis not present

## 2016-12-14 DIAGNOSIS — K429 Umbilical hernia without obstruction or gangrene: Secondary | ICD-10-CM | POA: Diagnosis not present

## 2016-12-14 DIAGNOSIS — R1031 Right lower quadrant pain: Secondary | ICD-10-CM | POA: Diagnosis not present

## 2016-12-14 LAB — COMPREHENSIVE METABOLIC PANEL WITH GFR
ALT: 21 U/L (ref 14–54)
AST: 22 U/L (ref 15–41)
Albumin: 4.5 g/dL (ref 3.5–5.0)
Alkaline Phosphatase: 112 U/L (ref 38–126)
Anion gap: 6 (ref 5–15)
BUN: 12 mg/dL (ref 6–20)
CO2: 26 mmol/L (ref 22–32)
Calcium: 9.2 mg/dL (ref 8.9–10.3)
Chloride: 103 mmol/L (ref 101–111)
Creatinine, Ser: 0.84 mg/dL (ref 0.44–1.00)
GFR calc Af Amer: >60 mL/min
GFR calc non Af Amer: >60 mL/min
Glucose, Bld: 107 mg/dL — ABNORMAL HIGH (ref 65–99)
Potassium: 3.4 mmol/L — ABNORMAL LOW (ref 3.5–5.1)
Sodium: 135 mmol/L (ref 135–145)
Total Bilirubin: 0.4 mg/dL (ref 0.3–1.2)
Total Protein: 7.1 g/dL (ref 6.5–8.1)

## 2016-12-14 LAB — CBC
HCT: 38 % (ref 35.0–47.0)
Hemoglobin: 12.7 g/dL (ref 12.0–16.0)
MCH: 27.2 pg (ref 26.0–34.0)
MCHC: 33.3 g/dL (ref 32.0–36.0)
MCV: 81.7 fL (ref 80.0–100.0)
Platelets: 178 10*3/uL (ref 150–440)
RBC: 4.66 MIL/uL (ref 3.80–5.20)
RDW: 13.9 % (ref 11.5–14.5)
WBC: 4.7 10*3/uL (ref 3.6–11.0)

## 2016-12-14 LAB — URINALYSIS, COMPLETE (UACMP) WITH MICROSCOPIC
Bilirubin Urine: NEGATIVE
Glucose, UA: NEGATIVE mg/dL
Hgb urine dipstick: NEGATIVE
Ketones, ur: NEGATIVE mg/dL
Leukocytes, UA: NEGATIVE
Nitrite: NEGATIVE
Protein, ur: NEGATIVE mg/dL
Specific Gravity, Urine: 1.009 (ref 1.005–1.030)
pH: 6 (ref 5.0–8.0)

## 2016-12-14 LAB — LIPASE, BLOOD: Lipase: 27 U/L (ref 11–51)

## 2016-12-14 MED ORDER — MORPHINE SULFATE (PF) 4 MG/ML IV SOLN
4.0000 mg | Freq: Once | INTRAVENOUS | Status: AC
  Administered 2016-12-14: 4 mg via INTRAVENOUS
  Filled 2016-12-14: qty 1

## 2016-12-14 MED ORDER — IOPAMIDOL (ISOVUE-300) INJECTION 61%
100.0000 mL | Freq: Once | INTRAVENOUS | Status: AC | PRN
  Administered 2016-12-14: 100 mL via INTRAVENOUS

## 2016-12-14 MED ORDER — OXYCODONE-ACETAMINOPHEN 5-325 MG PO TABS: 1.0000 | ORAL_TABLET | ORAL | 0 refills | Status: DC | PRN

## 2016-12-14 MED ORDER — ONDANSETRON HCL 4 MG/2ML IJ SOLN
4.0000 mg | Freq: Once | INTRAMUSCULAR | Status: AC
  Administered 2016-12-14: 4 mg via INTRAVENOUS
  Filled 2016-12-14: qty 2

## 2016-12-14 MED ORDER — IOPAMIDOL (ISOVUE-300) INJECTION 61%
30.0000 mL | Freq: Once | INTRAVENOUS | Status: AC | PRN
  Administered 2016-12-14: 30 mL via ORAL

## 2016-12-14 NOTE — ED Triage Notes (Signed)
Pt arrives POV to triage. Pt states that "this pain here has been there for some months"..."probably four months or so". Pt has hx of back pain but states that the pain is starting from her lower abdomen radiating into her back. Pt denies N/V/D. Pt is in NAD at this time.

## 2016-12-14 NOTE — ED Provider Notes (Signed)
[facility] Emergency Department Provider Note  Time seen:   I have reviewed the triage vital signs and the nursing notes.   HISTORY  Chief Complaint Abdominal Pain   HPI Christine Higgins is a 61 y.o. female with below list of chronic medical conditions presents with periumbilical/right flank pain radiating to back for "4 months or so". Patient has a history of known chronic back pain however states that the abdominal pain is new. Patient states that pain acutely worsened last 2 days. Patient denies any nausea vomiting diarrhea constipation or fever. She denies any aggravating or alleviating factors for her pain.   Past Medical History:  Diagnosis Date  . Degenerative disc disease, lumbar   . Fibromyalgia   . Graves disease   . Hyperlipidemia   . Hypertension   . Thyroid disease     Patient Active Problem List   Diagnosis Date Noted  . Back pain at L4-L5 level 10/30/2015  . DDD (degenerative disc disease), lumbosacral 10/30/2015  . Leg pain 07/28/2015  . Primary osteoarthritis of both knees 07/28/2015    Past Surgical History:  Procedure Laterality Date  . BACK SURGERY     sumbar  . carpel tunn Right   . carpel tunnel Left   . CESAREAN SECTION    . HAND SURGERY Right    scar tissue removal  . NECK SURGERY     "disk implant"  . SHOULDER SURGERY Right    spur removal  . TOTAL HIP ARTHROPLASTY Right     Prior to Admission medications   Medication Sig Start Date End Date Taking? Authorizing Provider  acetaminophen (TYLENOL) 500 MG tablet Take 500 mg by mouth 2 (two) times daily. 2 tablets    Historical Provider, MD  Ascorbic Acid (VITAMIN C) 1000 MG tablet Take 1,000 mg by mouth every other day.    Historical Provider, MD  atorvastatin (LIPITOR) 10 MG tablet Take 10 mg by mouth daily.    Historical Provider, MD  Calcium-Magnesium-Vitamin D (CALCIUM 1200+D3 PO) Take by mouth every other day.    Historical Provider, MD  clobetasol cream  (TEMOVATE) 0.10 % Apply 1 application topically 2 (two) times daily.    Historical Provider, MD  clonazePAM (KLONOPIN) 1 MG tablet Take 1 mg by mouth daily.    Historical Provider, MD  clotrimazole-betamethasone (LOTRISONE) cream Apply 1 application topically 2 (two) times daily.    Historical Provider, MD  DIPHENHYDRAMINE HCL, SLEEP, PO Take 25 mg by mouth at bedtime as needed. Reported on 10/30/2015    Historical Provider, MD  docusate sodium (COLACE) 100 MG capsule Take 100 mg by mouth as needed for mild constipation.    Historical Provider, MD  DULoxetine (CYMBALTA) 60 MG capsule Take 60 mg by mouth daily.    Historical Provider, MD  esomeprazole (NEXIUM) 40 MG capsule Take 40 mg by mouth 2 (two) times daily.    Historical Provider, MD  HYDROcodone-acetaminophen (NORCO) 5-325 MG tablet Take 1 tablet by mouth every 4 (four) hours as needed for moderate pain. Patient not taking: Reported on 10/14/2016 04/14/16   Duanne Guess, PA-C  Linaclotide [facility]) 290 MCG CAPS capsule Take 290 mcg by mouth daily.    Historical Provider, MD  losartan (COZAAR) 50 MG tablet Take 50 mg by mouth daily.    Historical Provider, MD  meloxicam (MOBIC) 7.5 MG tablet Take 1 tablet (7.5 mg total) by mouth 2 (two) times daily after a meal. 10/06/15   Lance Bosch, MD  methimazole (TAPAZOLE) 10 MG tablet Take 10 mg by mouth daily. 3 tablets    Historical Provider, MD  Multiple Vitamin (MULTIVITAMIN) capsule Take 1 capsule by mouth every other day.    Historical Provider, MD  Omega-3 Fatty Acids (FISH OIL) 1000 MG CAPS Take by mouth every other day. Reported on 10/30/2015    Historical Provider, MD  oxyCODONE-acetaminophen (ROXICET) 5-325 MG tablet Take 1 tablet by mouth every 4 (four) hours as needed for severe pain. 12/14/16   Gregor Hams, MD  polyethylene glycol [facility] Floria Raveling) packet Take 17 g by mouth as needed.    Historical Provider, MD  tiZANidine (ZANAFLEX) 4 MG capsule Take 4 mg by mouth 3 (three) times  daily.    Historical Provider, MD  traZODone (DESYREL) 150 MG tablet Take by mouth at bedtime.    Historical Provider, MD  triamterene-hydrochlorothiazide (MAXZIDE) 75-50 MG tablet Take 1 tablet by mouth daily.    Historical Provider, MD  vitamin B-12 (CYANOCOBALAMIN) 100 MCG tablet Take 100 mcg by mouth every other day.    Historical Provider, MD  vitamin E 100 UNIT capsule Take by mouth every other day.    Historical Provider, MD    Allergies Aspirin  Family History  Problem Relation Age of Onset  . Cancer Mother   . Heart disease Father     Social History Social History  Substance Use Topics  . Smoking status: Former Research scientist (life sciences)  . Smokeless tobacco: Never Used  . Alcohol use No    Review of Systems Constitutional: No fever/chills Eyes: No visual changes. ENT: No sore throat. Cardiovascular: Denies chest pain. Respiratory: Denies shortness of breath. Gastrointestinal: Positive for abdominal pain  No nausea, no vomiting.  No diarrhea.  No constipation. Genitourinary: Negative for dysuria. Musculoskeletal: Positive for back pain. Skin: Negative for rash. Neurological: Negative for headaches, focal weakness or numbness.  10-point ROS otherwise negative.  ____________________________________________   PHYSICAL EXAM:  VITAL SIGNS: ED Triage Vitals  Enc Vitals Group     BP 12/14/16 0148 135/71     Pulse Rate 12/14/16 0148 83     Resp 12/14/16 0148 18     Temp 12/14/16 0148 98 F (36.7 C)     Temp Source 12/14/16 0148 Oral     SpO2 12/14/16 0148 100 %     Weight 12/14/16 0144 260 lb (117.9 kg)     Height 12/14/16 0144 5\' 11"  (1.803 m)     Head Circumference --      Peak Flow --      Pain Score 12/14/16 0144 7     Pain Loc --      Pain Edu? --      Excl. in [place]? --     Constitutional: Alert and oriented. Well appearing and in no acute distress. Eyes: Conjunctivae are normal. PERRL. EOMI. Head: Atraumatic. Mouth/Throat: Mucous membranes are moist. Oropharynx  non-erythematous. Neck: No stridor.  Cardiovascular: Normal rate, regular rhythm. Good peripheral circulation. Grossly normal heart sounds. Respiratory: Normal respiratory effort.  No retractions. Lungs CTAB. Gastrointestinal: Right lower quadrant tenderness to palpation.. No distention.  Musculoskeletal: No lower extremity tenderness nor edema. No gross deformities of extremities. Neurologic:  Normal speech and language. No gross focal neurologic deficits are appreciated.  Skin:  Skin is warm, dry and intact. No rash noted. Psychiatric: Mood and affect are normal. Speech and behavior are normal.  ____________________________________________   LABS (all labs ordered are listed, but only abnormal results are displayed)  Labs Reviewed  COMPREHENSIVE METABOLIC PANEL - Abnormal; Notable for the following:       Result Value   Potassium 3.4 (*)    Glucose, Bld 107 (*)    All other components within normal limits  URINALYSIS, COMPLETE (UACMP) WITH MICROSCOPIC - Abnormal; Notable for the following:    Color, Urine YELLOW (*)    APPearance CLEAR (*)    Bacteria, UA RARE (*)    Squamous Epithelial / LPF 0-5 (*)    All other components within normal limits  LIPASE, BLOOD  CBC     RADIOLOGY I, [place] N Pio Eatherly, personally viewed and evaluated these images (plain radiographs) as part of my medical decision making, as well as reviewing the written report by the radiologist.  Ct Abdomen Pelvis W Contrast  Result Date: 12/14/2016 CLINICAL DATA:  Right lower quadrant pain for months.  Back pain. EXAM: CT ABDOMEN AND PELVIS WITH CONTRAST TECHNIQUE: Multidetector CT imaging of the abdomen and pelvis was performed using the standard protocol following bolus administration of intravenous contrast. CONTRAST:  164mL ISOVUE-300 IOPAMIDOL (ISOVUE-300) INJECTION 61% COMPARISON:  04/15/2016 FINDINGS: Lower chest: Lung bases are clear. Hepatobiliary: No focal liver abnormality is seen. No gallstones,  gallbladder wall thickening, or biliary dilatation. Pancreas: Unremarkable. No pancreatic ductal dilatation or surrounding inflammatory changes. Spleen: Normal in size without focal abnormality. Adrenals/Urinary Tract: Adrenal glands are unremarkable. Kidneys are normal, without renal calculi, focal lesion, or hydronephrosis. Bladder is unremarkable. Stomach/Bowel: Stomach is within normal limits. Appendix appears normal. No evidence of bowel wall thickening, distention, or inflammatory changes. Vascular/Lymphatic: Aortic atherosclerosis. No enlarged abdominal or pelvic lymph nodes. Reproductive: Uterus and bilateral adnexa are unremarkable. Other: Minimal umbilical hernia containing fat. No free air or free fluid in the abdomen. Musculoskeletal: Degenerative changes in the spine. Right total hip arthroplasty. IMPRESSION: No acute process demonstrated in the abdomen or pelvis. Appendix is normal. Electronically Signed   By: Lucienne Capers M.D.   On: 12/14/2016 06:28     Procedures   ____________________________________________   INITIAL IMPRESSION / ASSESSMENT AND PLAN / ED COURSE  Pertinent labs & imaging results that were available during my care of the patient were reviewed by me and considered in my medical decision making (see chart for details).  Patient's back pain most likely secondary to chronic degenerative changes of the lumbar spine. No acute process demonstrated on CT scan of the abdomen and pelvis. Patient referred to Dr. Ouida Sills primary care provider for further outpatient evaluation and management.     ____________________________________________  FINAL CLINICAL IMPRESSION(S) / ED DIAGNOSES  Final diagnoses:  Osteoarthritis of spine with radiculopathy, lumbar region  Umbilical hernia without obstruction and without gangrene     MEDICATIONS GIVEN DURING THIS VISIT:  Medications  morphine 4 MG/ML injection 4 mg (4 mg Intravenous Given 12/14/16 0536)  ondansetron  (ZOFRAN) injection 4 mg (4 mg Intravenous Given 12/14/16 0536)  iopamidol (ISOVUE-300) 61 % injection 30 mL (30 mLs Oral Contrast Given 12/14/16 0506)  iopamidol (ISOVUE-300) 61 % injection 100 mL (100 mLs Intravenous Contrast Given 12/14/16 0605)     NEW OUTPATIENT MEDICATIONS STARTED DURING THIS VISIT:  New Prescriptions   OXYCODONE-ACETAMINOPHEN (ROXICET) 5-325 MG TABLET    Take 1 tablet by mouth every 4 (four) hours as needed for severe pain.    Modified Medications   No medications on file    Discontinued Medications   No medications on file     Note:  This document was prepared using Dragon voice recognition software and may  include unintentional dictation errors.    Gregor Hams, MD 12/14/16 [phone]

## 2016-12-14 NOTE — ED Notes (Signed)
Pt c/o worsening pain, states pain is intermittent 7.5 now but when it gets worse 8.5  VS taken and WNL.

## 2016-12-20 DIAGNOSIS — M5416 Radiculopathy, lumbar region: Secondary | ICD-10-CM | POA: Diagnosis not present

## 2016-12-20 DIAGNOSIS — M5136 Other intervertebral disc degeneration, lumbar region: Secondary | ICD-10-CM | POA: Diagnosis not present

## 2016-12-27 DIAGNOSIS — E7800 Pure hypercholesterolemia, unspecified: Secondary | ICD-10-CM | POA: Diagnosis not present

## 2016-12-27 DIAGNOSIS — F325 Major depressive disorder, single episode, in full remission: Secondary | ICD-10-CM | POA: Diagnosis not present

## 2016-12-27 DIAGNOSIS — E119 Type 2 diabetes mellitus without complications: Secondary | ICD-10-CM | POA: Diagnosis not present

## 2016-12-27 DIAGNOSIS — I700 Atherosclerosis of aorta: Secondary | ICD-10-CM | POA: Diagnosis not present

## 2016-12-27 DIAGNOSIS — R350 Frequency of micturition: Secondary | ICD-10-CM | POA: Diagnosis not present

## 2016-12-27 DIAGNOSIS — Z0000 Encounter for general adult medical examination without abnormal findings: Secondary | ICD-10-CM | POA: Diagnosis not present

## 2016-12-27 DIAGNOSIS — Z6835 Body mass index (BMI) 35.0-35.9, adult: Secondary | ICD-10-CM | POA: Diagnosis not present

## 2016-12-31 DIAGNOSIS — E119 Type 2 diabetes mellitus without complications: Secondary | ICD-10-CM | POA: Diagnosis not present

## 2016-12-31 DIAGNOSIS — E7800 Pure hypercholesterolemia, unspecified: Secondary | ICD-10-CM | POA: Diagnosis not present

## 2016-12-31 DIAGNOSIS — R350 Frequency of micturition: Secondary | ICD-10-CM | POA: Diagnosis not present

## 2016-12-31 DIAGNOSIS — Z6835 Body mass index (BMI) 35.0-35.9, adult: Secondary | ICD-10-CM | POA: Diagnosis not present

## 2016-12-31 DIAGNOSIS — Z0000 Encounter for general adult medical examination without abnormal findings: Secondary | ICD-10-CM | POA: Diagnosis not present

## 2016-12-31 DIAGNOSIS — I700 Atherosclerosis of aorta: Secondary | ICD-10-CM | POA: Diagnosis not present

## 2016-12-31 DIAGNOSIS — F325 Major depressive disorder, single episode, in full remission: Secondary | ICD-10-CM | POA: Diagnosis not present

## 2017-01-07 ENCOUNTER — Other Ambulatory Visit: Payer: Self-pay | Admitting: Podiatry

## 2017-01-07 DIAGNOSIS — G5762 Lesion of plantar nerve, left lower limb: Secondary | ICD-10-CM

## 2017-01-07 DIAGNOSIS — G5621 Lesion of ulnar nerve, right upper limb: Secondary | ICD-10-CM | POA: Diagnosis not present

## 2017-01-07 DIAGNOSIS — M79672 Pain in left foot: Secondary | ICD-10-CM | POA: Diagnosis not present

## 2017-01-17 ENCOUNTER — Ambulatory Visit
Admission: RE | Admit: 2017-01-17 | Discharge: 2017-01-17 | Disposition: A | Discharge status: Home / Self Care | Payer: Medicare HMO | Source: Ambulatory Visit | Attending: Podiatry | Admitting: Podiatry

## 2017-01-17 DIAGNOSIS — G5762 Lesion of plantar nerve, left lower limb: Secondary | ICD-10-CM

## 2017-01-17 DIAGNOSIS — M19072 Primary osteoarthritis, left ankle and foot: Secondary | ICD-10-CM | POA: Insufficient documentation

## 2017-01-17 DIAGNOSIS — M79672 Pain in left foot: Secondary | ICD-10-CM | POA: Diagnosis not present

## 2017-01-21 DIAGNOSIS — G5762 Lesion of plantar nerve, left lower limb: Secondary | ICD-10-CM | POA: Diagnosis not present

## 2017-01-22 DIAGNOSIS — M5136 Other intervertebral disc degeneration, lumbar region: Secondary | ICD-10-CM | POA: Diagnosis not present

## 2017-01-22 DIAGNOSIS — M5416 Radiculopathy, lumbar region: Secondary | ICD-10-CM | POA: Diagnosis not present

## 2017-01-23 ENCOUNTER — Other Ambulatory Visit: Payer: Self-pay | Admitting: Podiatry

## 2017-01-29 ENCOUNTER — Encounter: Payer: Self-pay | Admitting: *Deleted

## 2017-01-30 DIAGNOSIS — K429 Umbilical hernia without obstruction or gangrene: Secondary | ICD-10-CM | POA: Diagnosis not present

## 2017-01-31 NOTE — Discharge Instructions (Signed)
[facility] °[facility] ° °POST OPERATIVE INSTRUCTIONS FOR DR. TROXLER AND DR. FOWLER °[facility] ° ° °1. Take your medication as prescribed.  Pain medication should be taken only as needed. ° °2. Keep the dressing clean, dry and intact. ° °3. Keep your foot elevated above the heart level for the first 48 hours. ° °4. Walking to the bathroom and brief periods of walking are acceptable, unless we have instructed you to be non-weight bearing. ° °5. Always wear your post-op shoe when walking.  Always use your crutches if you are to be non-weight bearing. ° °6. Do not take a shower. Baths are permissible as long as the foot is kept out of the water.  ° °7. Every hour you are awake:  °- Bend your knee 15 times. °- Flex foot 15 times °- Massage calf 15 times ° °8. Call [facility] ([phone]) if any of the following problems occur: °- You develop a temperature or fever. °- The bandage becomes saturated with blood. °- Medication does not stop your pain. °- Injury of the foot occurs. °- Any symptoms of infection including redness, odor, or red streaks running from wound. ° ° °General Anesthesia, Adult, Care After °These instructions provide you with information about caring for yourself after your procedure. Your health care provider may also give you more specific instructions. Your treatment has been planned according to current medical practices, but problems sometimes occur. Call your health care provider if you have any problems or questions after your procedure. °What can I expect after the procedure? °After the procedure, it is common to have: °· Vomiting. °· A sore throat. °· Mental slowness. ° °It is common to feel: °· Nauseous. °· Cold or shivery. °· Sleepy. °· Tired. °· Sore or achy, even in parts of your body where you did not have surgery. ° °Follow these instructions at home: °For at least 24 hours after the procedure: °· Do not: °? Participate in  activities where you could fall or become injured. °? Drive. °? Use heavy machinery. °? Drink alcohol. °? Take sleeping pills or medicines that cause drowsiness. °? Make important decisions or sign legal documents. °? Take care of children on your own. °· Rest. °Eating and drinking °· If you vomit, drink water, juice, or soup when you can drink without vomiting. °· Drink enough fluid to keep your urine clear or pale yellow. °· Make sure you have little or no nausea before eating solid foods. °· Follow the diet recommended by your health care provider. °General instructions °· Have a responsible adult stay with you until you are awake and alert. °· Return to your normal activities as told by your health care provider. Ask your health care provider what activities are safe for you. °· Take over-the-counter and prescription medicines only as told by your health care provider. °· If you smoke, do not smoke without supervision. °· Keep all follow-up visits as told by your health care provider. This is important. °Contact a health care provider if: °· You continue to have nausea or vomiting at home, and medicines are not helpful. °· You cannot drink fluids or start eating again. °· You cannot urinate after 8-12 hours. °· You develop a skin rash. °· You have fever. °· You have increasing redness at the site of your procedure. °Get help right away if: °· You have difficulty breathing. °· You have chest pain. °· You have unexpected bleeding. °· You feel that you   are having a life-threatening or urgent problem. °This information is not intended to replace advice given to you by your health care provider. Make sure you discuss any questions you have with your health care provider. °Document Released: 11/18/2000 Document Revised: 01/15/2016 Document Reviewed: 07/27/2015 °[employer]vier Interactive Patient Education © 2018 [employer] ° °

## 2017-02-03 DIAGNOSIS — Z01818 Encounter for other preprocedural examination: Secondary | ICD-10-CM | POA: Diagnosis not present

## 2017-02-03 DIAGNOSIS — R32 Unspecified urinary incontinence: Secondary | ICD-10-CM | POA: Diagnosis not present

## 2017-02-05 ENCOUNTER — Encounter
Admission: RE | Disposition: A | Discharge status: Home / Self Care | Payer: Self-pay | Source: Ambulatory Visit | Attending: Podiatry

## 2017-02-05 ENCOUNTER — Ambulatory Visit: Payer: Medicare HMO | Admitting: Anesthesiology

## 2017-02-05 ENCOUNTER — Ambulatory Visit
Admission: RE | Admit: 2017-02-05 | Discharge: 2017-02-05 | Disposition: A | Discharge status: Home / Self Care | Payer: Medicare HMO | Source: Ambulatory Visit | Attending: Podiatry | Admitting: Podiatry

## 2017-02-05 DIAGNOSIS — K219 Gastro-esophageal reflux disease without esophagitis: Secondary | ICD-10-CM | POA: Diagnosis not present

## 2017-02-05 DIAGNOSIS — G5762 Lesion of plantar nerve, left lower limb: Secondary | ICD-10-CM | POA: Insufficient documentation

## 2017-02-05 DIAGNOSIS — E785 Hyperlipidemia, unspecified: Secondary | ICD-10-CM | POA: Insufficient documentation

## 2017-02-05 DIAGNOSIS — F329 Major depressive disorder, single episode, unspecified: Secondary | ICD-10-CM | POA: Insufficient documentation

## 2017-02-05 DIAGNOSIS — I10 Essential (primary) hypertension: Secondary | ICD-10-CM | POA: Insufficient documentation

## 2017-02-05 DIAGNOSIS — Z791 Long term (current) use of non-steroidal anti-inflammatories (NSAID): Secondary | ICD-10-CM | POA: Insufficient documentation

## 2017-02-05 DIAGNOSIS — E119 Type 2 diabetes mellitus without complications: Secondary | ICD-10-CM | POA: Diagnosis not present

## 2017-02-05 DIAGNOSIS — Z79899 Other long term (current) drug therapy: Secondary | ICD-10-CM | POA: Diagnosis not present

## 2017-02-05 DIAGNOSIS — Z87891 Personal history of nicotine dependence: Secondary | ICD-10-CM | POA: Diagnosis not present

## 2017-02-05 HISTORY — DX: Gastro-esophageal reflux disease without esophagitis: K21.9

## 2017-02-05 HISTORY — PX: EXCISION MORTON'S NEUROMA: SHX5013

## 2017-02-05 SURGERY — EXCISION, MORTON'S NEUROMA
Anesthesia: General | Site: Foot | Laterality: Left | Wound class: Clean

## 2017-02-05 MED ORDER — ONDANSETRON HCL 4 MG PO TABS: 4.0000 mg | ORAL_TABLET | Freq: Four times a day (QID) | ORAL | Status: DC | PRN

## 2017-02-05 MED ORDER — ACETAMINOPHEN 325 MG PO TABS: 325.0000 mg | ORAL_TABLET | ORAL | Status: DC | PRN

## 2017-02-05 MED ORDER — DEXAMETHASONE SODIUM PHOSPHATE 4 MG/ML IJ SOLN
INTRAMUSCULAR | Status: DC | PRN
  Administered 2017-02-05: 1 mL

## 2017-02-05 MED ORDER — FENTANYL CITRATE (PF) 100 MCG/2ML IJ SOLN
INTRAMUSCULAR | Status: DC | PRN
  Administered 2017-02-05 (×2): 50 ug via INTRAVENOUS

## 2017-02-05 MED ORDER — LACTATED RINGERS IV SOLN
INTRAVENOUS | Status: DC
Start: 2017-02-05 — End: 2017-02-05
  Administered 2017-02-05: 14:00:00 via INTRAVENOUS

## 2017-02-05 MED ORDER — HYDROCODONE-ACETAMINOPHEN 5-325 MG PO TABS: 1.0000 | ORAL_TABLET | Freq: Four times a day (QID) | ORAL | 0 refills | Status: DC | PRN

## 2017-02-05 MED ORDER — MIDAZOLAM HCL 2 MG/2ML IJ SOLN
INTRAMUSCULAR | Status: DC | PRN
  Administered 2017-02-05: 2 mg via INTRAVENOUS

## 2017-02-05 MED ORDER — CEFAZOLIN SODIUM-DEXTROSE 2-4 GM/100ML-% IV SOLN
2.0000 g | INTRAVENOUS | Status: AC
  Administered 2017-02-05: 2 g via INTRAVENOUS

## 2017-02-05 MED ORDER — LIDOCAINE HCL 1 % IJ SOLN
INTRAMUSCULAR | Status: DC | PRN
  Administered 2017-02-05: 2 mL

## 2017-02-05 MED ORDER — HYDROCODONE-ACETAMINOPHEN 5-325 MG PO TABS: 1.0000 | ORAL_TABLET | ORAL | Status: DC | PRN

## 2017-02-05 MED ORDER — PROPOFOL 500 MG/50ML IV EMUL
INTRAVENOUS | Status: DC | PRN
  Administered 2017-02-05: 50 ug/kg/min via INTRAVENOUS

## 2017-02-05 MED ORDER — POVIDONE-IODINE 7.5 % EX SOLN: Freq: Once | CUTANEOUS | Status: DC

## 2017-02-05 MED ORDER — OXYCODONE HCL 5 MG PO TABS: 5.0000 mg | ORAL_TABLET | Freq: Once | ORAL | Status: DC | PRN

## 2017-02-05 MED ORDER — OXYCODONE HCL 5 MG/5ML PO SOLN: 5.0000 mg | Freq: Once | ORAL | Status: DC | PRN

## 2017-02-05 MED ORDER — ONDANSETRON HCL 4 MG/2ML IJ SOLN: 4.0000 mg | Freq: Once | INTRAMUSCULAR | Status: DC | PRN

## 2017-02-05 MED ORDER — LIDOCAINE HCL (CARDIAC) 20 MG/ML IV SOLN
INTRAVENOUS | Status: DC | PRN
  Administered 2017-02-05: 50 mg via INTRAVENOUS

## 2017-02-05 MED ORDER — BUPIVACAINE HCL (PF) 0.25 % IJ SOLN
INTRAMUSCULAR | Status: DC | PRN
  Administered 2017-02-05: 10 mL
  Administered 2017-02-05: 2 mL

## 2017-02-05 MED ORDER — ACETAMINOPHEN 160 MG/5ML PO SOLN: 325.0000 mg | ORAL | Status: DC | PRN

## 2017-02-05 MED ORDER — ONDANSETRON HCL 4 MG/2ML IJ SOLN: 4.0000 mg | Freq: Four times a day (QID) | INTRAMUSCULAR | Status: DC | PRN

## 2017-02-05 MED ORDER — FENTANYL CITRATE (PF) 100 MCG/2ML IJ SOLN: 25.0000 ug | INTRAMUSCULAR | Status: DC | PRN

## 2017-02-05 SURGICAL SUPPLY — 30 items
BANDAGE ELASTIC 4 VELCRO NS (GAUZE/BANDAGES/DRESSINGS) ×2 IMPLANT
BNDG CMPR 75X41 PLY HI ABS (GAUZE/BANDAGES/DRESSINGS) ×1
BNDG COHESIVE 4X5 TAN STRL (GAUZE/BANDAGES/DRESSINGS) ×1 IMPLANT
BNDG ESMARK 4X12 TAN STRL LF (GAUZE/BANDAGES/DRESSINGS) ×2 IMPLANT
BNDG GAUZE 4.5X4.1 6PLY STRL (MISCELLANEOUS) ×2 IMPLANT
BNDG STRETCH 4X75 STRL LF (GAUZE/BANDAGES/DRESSINGS) ×2 IMPLANT
CANISTER SUCT 1200ML W/VALVE (MISCELLANEOUS) ×2 IMPLANT
COVER LIGHT HANDLE UNIVERSAL (MISCELLANEOUS) ×2 IMPLANT
CUFF TOURN SGL QUICK 18 (TOURNIQUET CUFF) ×1 IMPLANT
DURAPREP 26ML APPLICATOR (WOUND CARE) ×2 IMPLANT
ELECT REM PT RETURN 9FT ADLT (ELECTROSURGICAL) ×2
ELECTRODE REM PT RTRN 9FT ADLT (ELECTROSURGICAL) ×1 IMPLANT
GAUZE PETRO XEROFOAM 1X8 (MISCELLANEOUS) ×2 IMPLANT
GAUZE SPONGE 4X4 12PLY STRL (GAUZE/BANDAGES/DRESSINGS) ×2 IMPLANT
GLOVE BIO SURGEON STRL SZ7.5 (GLOVE) ×3 IMPLANT
GLOVE INDICATOR 8.0 STRL GRN (GLOVE) ×3 IMPLANT
GOWN STRL REUS W/ TWL LRG LVL3 (GOWN DISPOSABLE) ×2 IMPLANT
GOWN STRL REUS W/TWL LRG LVL3 (GOWN DISPOSABLE) ×4
NDL HYPO 25GX1X1/2 BEV (NEEDLE) ×2 IMPLANT
NEEDLE HYPO 25GX1X1/2 BEV (NEEDLE) ×4 IMPLANT
NS IRRIG 500ML POUR BTL (IV SOLUTION) ×2 IMPLANT
PACK EXTREMITY ARMC (MISCELLANEOUS) ×2 IMPLANT
STOCKINETTE IMPERVIOUS LG (DRAPES) ×2 IMPLANT
STRAP BODY AND KNEE 60X3 (MISCELLANEOUS) ×2 IMPLANT
STRIP CLOSURE SKIN 1/4X4 (GAUZE/BANDAGES/DRESSINGS) ×2 IMPLANT
SUT MNCRL+ 5-0 UNDYED PC-3 (SUTURE) ×1 IMPLANT
SUT MONOCRYL 5-0 (SUTURE) ×1
SUT VIC AB 4-0 FS2 27 (SUTURE) ×2 IMPLANT
SWABSTK COMLB BENZOIN TINCTURE (MISCELLANEOUS) ×2 IMPLANT
SYRINGE 10CC LL (SYRINGE) ×2 IMPLANT

## 2017-02-05 NOTE — H&P (Signed)
HISTORY AND PHYSICAL INTERVAL NOTE:  02/05/2017  1:18 PM  [name]  has presented today for surgery, with the diagnosis of Lesion of left plantar nerve  G57.62.  The various methods of treatment have been discussed with the patient.  No guarantees were given.  After consideration of risks, benefits and other options for treatment, the patient has consented to surgery.  I have reviewed the patients' chart and labs.    Patient Vitals for the past 24 hrs:  BP Temp Temp src Pulse SpO2 Weight  02/05/17 1300 122/75 97.9 F (36.6 C) Temporal 95 100 % 117.5 kg (259 lb)    A history and physical examination was performed in my office.  The patient was reexamined.  There have been no changes to this history and physical examination.  Christine Higgins A

## 2017-02-05 NOTE — Anesthesia Preprocedure Evaluation (Signed)
Anesthesia Evaluation  Patient identified by MRN, date of birth, ID band Patient awake    Reviewed: Allergy & Precautions, H&P , NPO status , Patient's Chart, lab work & pertinent test results  Airway Mallampati: II  TM Distance: >3 FB Neck ROM: full    Dental no notable dental hx. (+) Upper Dentures   Pulmonary former smoker,    Pulmonary exam normal        Cardiovascular hypertension, Normal cardiovascular exam     Neuro/Psych    GI/Hepatic GERD  ,  Endo/Other  Hyperthyroidism   Renal/GU      Musculoskeletal   Abdominal   Peds  Hematology   Anesthesia Other Findings   Reproductive/Obstetrics                             Anesthesia Physical Anesthesia Plan  ASA: II  Anesthesia Plan: General   Post-op Pain Management:    Induction:   PONV Risk Score and Plan: 3 and Ondansetron, Dexamethasone, Propofol and Midazolam  Airway Management Planned:   Additional Equipment:   Intra-op Plan:   Post-operative Plan:   Informed Consent: I have reviewed the patients History and Physical, chart, labs and discussed the procedure including the risks, benefits and alternatives for the proposed anesthesia with the patient or authorized representative who has indicated his/her understanding and acceptance.     Plan Discussed with:   Anesthesia Plan Comments:         Anesthesia Quick Evaluation

## 2017-02-05 NOTE — Transfer of Care (Signed)
Immediate Anesthesia Transfer of Care Note  Patient: Christine Higgins  Procedure(s) Performed: Procedure(s) with comments: EXCISION MORTON'S NEUROMA (Left) - iva with local  Patient Location: PACU  Anesthesia Type: General  Level of Consciousness: awake, alert  and patient cooperative  Airway and Oxygen Therapy: Patient Spontanous Breathing and Patient connected to supplemental oxygen  Post-op Assessment: Post-op Vital signs reviewed, Patient's Cardiovascular Status Stable, Respiratory Function Stable, Patent Airway and No signs of Nausea or vomiting  Post-op Vital Signs: Reviewed and stable  Complications: No apparent anesthesia complications

## 2017-02-05 NOTE — Anesthesia Postprocedure Evaluation (Deleted)
Anesthesia Post Note  Patient: Christine Higgins  Procedure(s) Performed: Procedure(s) (LRB): EXCISION MORTON'S NEUROMA (Left)  Patient location during evaluation: PACU Anesthesia Type: General Level of consciousness: awake and alert and oriented Pain management: satisfactory to patient Vital Signs Assessment: post-procedure vital signs reviewed and stable Respiratory status: spontaneous breathing, nonlabored ventilation and respiratory function stable Cardiovascular status: blood pressure returned to baseline and stable Postop Assessment: Adequate PO intake and No signs of nausea or vomiting Anesthetic complications: no    Raliegh Ip

## 2017-02-05 NOTE — Anesthesia Postprocedure Evaluation (Signed)
Anesthesia Post Note  Patient: Christine Higgins  Procedure(s) Performed: Procedure(s) (LRB): EXCISION MORTON'S NEUROMA (Left)  Patient location during evaluation: PACU Anesthesia Type: General Level of consciousness: awake and alert and oriented Pain management: satisfactory to patient Vital Signs Assessment: post-procedure vital signs reviewed and stable Respiratory status: spontaneous breathing, nonlabored ventilation and respiratory function stable Cardiovascular status: blood pressure returned to baseline and stable Postop Assessment: Adequate PO intake and No signs of nausea or vomiting Anesthetic complications: no    Raliegh Ip

## 2017-02-05 NOTE — Addendum Note (Signed)
Addendum  created 02/05/17 1454 by Ronelle Nigh, MD   Delete clinical note

## 2017-02-05 NOTE — Anesthesia Procedure Notes (Signed)
Performed by: Londell Moh Pre-anesthesia Checklist: Patient identified, Emergency Drugs available, Suction available, Patient being monitored and Timeout performed Patient Re-evaluated:Patient Re-evaluated prior to inductionOxygen Delivery Method: Simple face mask Intubation Type: IV induction

## 2017-02-05 NOTE — Op Note (Signed)
Operative note   Surgeon:Alashia Brownfield Lawyer: None    Preop diagnosis: Left third webspace neuroma    Postop diagnosis: Left third webspace neuritis     Procedure: Excision of neuroma with release of the deep transverse intermetatarsal ligament    EBL: Minimal    Anesthesia:local and IV sedation    Hemostasis: Epinephrine 1 100,000 infiltrated along incision site    Specimen: Soft tissue and questionable neuroma left third webspace    Complications: None    Operative indications:Christine Higgins is an 61 y.o. that presents today for surgical intervention.  The risks/benefits/alternatives/complications have been discussed and consent has been given.    Procedure:  Patient was brought into the OR and placed on the operating table in thesupine position. After anesthesia was obtained theleft lower extremity was prepped and draped in usual sterile fashion.  Attention was directed to the left third webspace and intermetatarsal space where a longitudinal incision was made from the webspace to the distal one third of the metatarsal region. Sharp and blunt dissection carried down to the deep transverse intermetatarsal ligament. This ligament was found to be quite tight as well as thick. A transection of this revealed much better motion between the third and fourth metatarsal heads. At this time exploration of the webspace was performed. Thickened adipose tissue was found with mild tearing of the soft tissue around the third and fourth metatarsophalangeal joint. The thickened soft tissue adipose between the third and fourth metatarsal heads was then excised. Small fibers of nerve branches to the third and fourth toes were noted within this area. This was sent for pathological examination. Evaluation of the deep webspace did not reveal a large plain lesion at this time. The deep fascial ligament in the third webspacee was found and decompressed to the level of the mid shaft of the metatarsals.  The wound was then flushed with copious amounts or irrigation. Closure was performed with a 3-0 Vicryl for the subcutaneous tissue and a 5-0 Monocryl for skin. 0.25% Marcaine was a straight around all areas. This was also injected with 4 mg of dexamethasone. A sterile bulky dressing was then applied.    Patient tolerated the procedure and anesthesia well.  Was transported from the OR to the PACU with all vital signs stable and vascular status intact. To be discharged per routine protocol.  Will follow up in approximately 1 week in the outpatient clinic.

## 2017-02-06 ENCOUNTER — Encounter: Payer: Self-pay | Admitting: Podiatry

## 2017-02-07 LAB — SURGICAL PATHOLOGY

## 2017-02-17 DIAGNOSIS — E0500 Thyrotoxicosis with diffuse goiter without thyrotoxic crisis or storm: Secondary | ICD-10-CM | POA: Diagnosis not present

## 2017-02-17 DIAGNOSIS — H05839 Thyroid orbitopathy, unspecified orbit: Secondary | ICD-10-CM | POA: Insufficient documentation

## 2017-02-17 DIAGNOSIS — E042 Nontoxic multinodular goiter: Secondary | ICD-10-CM | POA: Diagnosis not present

## 2017-02-18 DIAGNOSIS — M5416 Radiculopathy, lumbar region: Secondary | ICD-10-CM | POA: Diagnosis not present

## 2017-02-18 DIAGNOSIS — M5136 Other intervertebral disc degeneration, lumbar region: Secondary | ICD-10-CM | POA: Diagnosis not present

## 2017-02-18 DIAGNOSIS — G5601 Carpal tunnel syndrome, right upper limb: Secondary | ICD-10-CM | POA: Diagnosis not present

## 2017-02-18 DIAGNOSIS — M62838 Other muscle spasm: Secondary | ICD-10-CM | POA: Diagnosis not present

## 2017-02-18 DIAGNOSIS — M5412 Radiculopathy, cervical region: Secondary | ICD-10-CM | POA: Diagnosis not present

## 2017-02-18 DIAGNOSIS — M5030 Other cervical disc degeneration, unspecified cervical region: Secondary | ICD-10-CM | POA: Diagnosis not present

## 2017-02-19 ENCOUNTER — Other Ambulatory Visit: Payer: Self-pay | Admitting: Internal Medicine

## 2017-03-03 DIAGNOSIS — R102 Pelvic and perineal pain: Secondary | ICD-10-CM | POA: Diagnosis not present

## 2017-03-03 DIAGNOSIS — E669 Obesity, unspecified: Secondary | ICD-10-CM | POA: Diagnosis not present

## 2017-03-03 DIAGNOSIS — K4090 Unilateral inguinal hernia, without obstruction or gangrene, not specified as recurrent: Secondary | ICD-10-CM | POA: Diagnosis not present

## 2017-03-03 DIAGNOSIS — R300 Dysuria: Secondary | ICD-10-CM | POA: Diagnosis not present

## 2017-03-05 DIAGNOSIS — I10 Essential (primary) hypertension: Secondary | ICD-10-CM | POA: Insufficient documentation

## 2017-03-05 DIAGNOSIS — E7800 Pure hypercholesterolemia, unspecified: Secondary | ICD-10-CM | POA: Diagnosis not present

## 2017-03-05 DIAGNOSIS — E119 Type 2 diabetes mellitus without complications: Secondary | ICD-10-CM | POA: Diagnosis not present

## 2017-03-05 DIAGNOSIS — R102 Pelvic and perineal pain: Secondary | ICD-10-CM | POA: Diagnosis not present

## 2017-03-05 DIAGNOSIS — Z6835 Body mass index (BMI) 35.0-35.9, adult: Secondary | ICD-10-CM | POA: Diagnosis not present

## 2017-03-06 DIAGNOSIS — R32 Unspecified urinary incontinence: Secondary | ICD-10-CM | POA: Diagnosis not present

## 2017-03-07 DIAGNOSIS — M170 Bilateral primary osteoarthritis of knee: Secondary | ICD-10-CM | POA: Diagnosis not present

## 2017-03-14 ENCOUNTER — Emergency Department
Admission: EM | Admit: 2017-03-14 | Discharge: 2017-03-14 | Disposition: A | Discharge status: Home / Self Care | Payer: Medicare HMO | Attending: Emergency Medicine | Admitting: Emergency Medicine

## 2017-03-14 ENCOUNTER — Emergency Department: Payer: Medicare HMO

## 2017-03-14 ENCOUNTER — Encounter: Payer: Self-pay | Admitting: *Deleted

## 2017-03-14 DIAGNOSIS — R300 Dysuria: Secondary | ICD-10-CM | POA: Diagnosis not present

## 2017-03-14 DIAGNOSIS — Z96641 Presence of right artificial hip joint: Secondary | ICD-10-CM | POA: Diagnosis not present

## 2017-03-14 DIAGNOSIS — M549 Dorsalgia, unspecified: Secondary | ICD-10-CM | POA: Insufficient documentation

## 2017-03-14 DIAGNOSIS — I10 Essential (primary) hypertension: Secondary | ICD-10-CM | POA: Insufficient documentation

## 2017-03-14 DIAGNOSIS — R1031 Right lower quadrant pain: Secondary | ICD-10-CM | POA: Diagnosis not present

## 2017-03-14 DIAGNOSIS — M25551 Pain in right hip: Secondary | ICD-10-CM | POA: Diagnosis not present

## 2017-03-14 DIAGNOSIS — Z79899 Other long term (current) drug therapy: Secondary | ICD-10-CM | POA: Insufficient documentation

## 2017-03-14 DIAGNOSIS — M79604 Pain in right leg: Secondary | ICD-10-CM | POA: Diagnosis not present

## 2017-03-14 DIAGNOSIS — Z87891 Personal history of nicotine dependence: Secondary | ICD-10-CM | POA: Diagnosis not present

## 2017-03-14 DIAGNOSIS — G8929 Other chronic pain: Secondary | ICD-10-CM | POA: Diagnosis not present

## 2017-03-14 DIAGNOSIS — M79661 Pain in right lower leg: Secondary | ICD-10-CM | POA: Diagnosis not present

## 2017-03-14 LAB — CBC WITH DIFFERENTIAL/PLATELET
Basophils Absolute: 0 10*3/uL (ref 0–0.1)
Basophils Relative: 1 %
Eosinophils Absolute: 0.1 10*3/uL (ref 0–0.7)
Eosinophils Relative: 3 %
HCT: 39.1 % (ref 35.0–47.0)
Hemoglobin: 13.2 g/dL (ref 12.0–16.0)
Lymphocytes Relative: 31 %
Lymphs Abs: 1.5 10*3/uL (ref 1.0–3.6)
MCH: 27.6 pg (ref 26.0–34.0)
MCHC: 33.7 g/dL (ref 32.0–36.0)
MCV: 82 fL (ref 80.0–100.0)
Monocytes Absolute: 0.4 10*3/uL (ref 0.2–0.9)
Monocytes Relative: 9 %
Neutro Abs: 2.6 10*3/uL (ref 1.4–6.5)
Neutrophils Relative %: 56 %
Platelets: 169 10*3/uL (ref 150–440)
RBC: 4.77 MIL/uL (ref 3.80–5.20)
RDW: 13.8 % (ref 11.5–14.5)
WBC: 4.7 10*3/uL (ref 3.6–11.0)

## 2017-03-14 LAB — URINALYSIS, COMPLETE (UACMP) WITH MICROSCOPIC
Bilirubin Urine: NEGATIVE
Glucose, UA: NEGATIVE mg/dL
Hgb urine dipstick: NEGATIVE
Ketones, ur: NEGATIVE mg/dL
Leukocytes, UA: NEGATIVE
Nitrite: NEGATIVE
Protein, ur: NEGATIVE mg/dL
Specific Gravity, Urine: 1.008 (ref 1.005–1.030)
Squamous Epithelial / HPF: NONE SEEN
pH: 6 (ref 5.0–8.0)

## 2017-03-14 MED ORDER — HYDROCODONE-ACETAMINOPHEN 5-325 MG PO TABS
2.0000 | ORAL_TABLET | Freq: Once | ORAL | Status: AC
  Administered 2017-03-14: 2 via ORAL
  Filled 2017-03-14: qty 2

## 2017-03-14 MED ORDER — HYDROCODONE-ACETAMINOPHEN 5-325 MG PO TABS: 1.0000 | ORAL_TABLET | Freq: Four times a day (QID) | ORAL | 0 refills | Status: DC | PRN

## 2017-03-14 NOTE — ED Notes (Signed)
Pt has a piece of tape marking spot in right groin that she has palpated a knot. No obvious swelling or deformity noted. Area is tender to touch upon assessment.

## 2017-03-14 NOTE — ED Notes (Signed)
NAD noted at time of D/C. Pt denies questions or concerns. Pt taken to the lobby via wheelchair at this time.  

## 2017-03-14 NOTE — ED Notes (Signed)
Pt blood drawn, red,lavender,blue, and green top all sent to the lab by this tech;pa advised

## 2017-03-14 NOTE — Discharge Instructions (Signed)
Call and make an appointment with Dr. Ouida Sills for follow up in the [facility]. Apply moist warm compresses to your right groin area as needed for comfort. Take hydrocodone/acetaminophen one every 6 hours as needed for moderate pain. You need to see your primary care doctor for any continued pain medication. Did not take extra Tylenol/acetaminophen with this medication.

## 2017-03-14 NOTE — ED Triage Notes (Signed)
States a "knot" in her right groin area, when asked to see pt states "you cant see it but you can feel it and I put a piece of tape on it", states burning in her right leg from her hip down to her thigh for 1 year, awake and alert, uses cane

## 2017-03-14 NOTE — ED Provider Notes (Signed)
[facility] Emergency Department Provider Note   ____________________________________________   First MD Initiated Contact with Patient 03/14/17 1032     (approximate)  I have reviewed the triage vital signs and the nursing notes.   HISTORY  Chief Complaint Leg Pain    HPI Christine Higgins is a 61 y.o. female is here with complaint of right groin and thigh pain that has been intense for the last 3 days. Patient states that she has had pain in her right hip and leg pain for one year and was seen at the orthopedic Department of [facility] and reassured that her she had should not be causing her any pain. Patient is also concerned because she has "knots" in her right groin area. She denies any recent injury. She has not been taking any over-the-counter medication. She rates her pain as a 9/10.  Patient states she is also been to Dr. Rochel Brome for evaluation of a right sided hernia. Patient is to return in several months because she states he was unable to find anything. She is also seeing [facility] orthopedic Department where she was told "there is nothing wrong with her right hip".   Past Medical History:  Diagnosis Date  . Degenerative disc disease, lumbar   . Fibromyalgia   . GERD (gastroesophageal reflux disease)   . Graves disease   . Hyperlipidemia   . Hypertension   . Thyroid disease     Patient Active Problem List   Diagnosis Date Noted  . Back pain at L4-L5 level 10/30/2015  . DDD (degenerative disc disease), lumbosacral 10/30/2015  . Leg pain 07/28/2015  . Primary osteoarthritis of both knees 07/28/2015    Past Surgical History:  Procedure Laterality Date  . BACK SURGERY     sumbar  . carpel tunn Right   . carpel tunnel Left   . CESAREAN SECTION    . EXCISION MORTON'S NEUROMA Left 02/05/2017   Procedure: EXCISION MORTON'S NEUROMA;  Surgeon: Samara Deist, DPM;  Location: [place];  Service: Podiatry;   Laterality: Left;  iva with local  . HAND SURGERY Right    scar tissue removal  . NECK SURGERY     "disk implant"  . SHOULDER SURGERY Right    spur removal  . TOTAL HIP ARTHROPLASTY Right     Prior to Admission medications   Medication Sig Start Date End Date Taking? Authorizing Provider  acetaminophen (TYLENOL) 500 MG tablet Take 500 mg by mouth 2 (two) times daily. 2 tablets    [provider]  Ascorbic Acid (VITAMIN C) 1000 MG tablet Take 1,000 mg by mouth every other day.    [provider]  atorvastatin (LIPITOR) 10 MG tablet Take 10 mg by mouth daily.    [provider]  Calcium-Magnesium-Vitamin D (CALCIUM 1200+D3 PO) Take by mouth every other day.    [provider]  clobetasol cream (TEMOVATE) 3.00 % Apply 1 application topically 2 (two) times daily.    [provider]  clonazePAM (KLONOPIN) 1 MG tablet Take 1 mg by mouth daily.    [provider]  clotrimazole-betamethasone (LOTRISONE) cream Apply 1 application topically 2 (two) times daily.    [provider]  DULoxetine (CYMBALTA) 60 MG capsule Take 60 mg by mouth daily.    [provider]  esomeprazole (NEXIUM) 40 MG capsule Take 40 mg by mouth 2 (two) times daily.    [provider]  gabapentin (NEURONTIN) 100 MG capsule Take  100 mg by mouth 3 (three) times daily as needed.    [provider]  HYDROcodone-acetaminophen (NORCO) 5-325 MG tablet Take 1 tablet by mouth every 6 (six) hours as needed for moderate pain. 03/14/17   Johnn Hai, PA-C  Linaclotide (LINZESS) 290 MCG CAPS capsule Take 290 mcg by mouth daily.    [provider]  losartan (COZAAR) 50 MG tablet Take 50 mg by mouth daily.    [provider]  meloxicam (MOBIC) 7.5 MG tablet Take 1 tablet (7.5 mg total) by mouth 2 (two) times daily after a meal. 10/06/15   Lance Bosch, MD  methimazole (TAPAZOLE) 10 MG tablet Take 10 mg by mouth daily. 3 tablets     [provider]  Multiple Vitamin (MULTIVITAMIN) capsule Take 1 capsule by mouth every other day.    [provider]  Naproxen Sodium 220 MG CAPS Take by mouth 2 (two) times daily as needed.    [provider]  polyethylene glycol (MIRALAX / GLYCOLAX) packet Take 17 g by mouth as needed.    [provider]  sucralfate (CARAFATE) 1 g tablet Take 1 g by mouth 4 (four) times daily.    [provider]  tiZANidine (ZANAFLEX) 4 MG capsule Take 4 mg by mouth 3 (three) times daily.    [provider]  traZODone (DESYREL) 150 MG tablet Take by mouth at bedtime.    [provider]  triamterene-hydrochlorothiazide (MAXZIDE) 75-50 MG tablet Take 1 tablet by mouth daily.    [provider]  vitamin B-12 (CYANOCOBALAMIN) 100 MCG tablet Take 100 mcg by mouth every other day.    [provider]  vitamin E 100 UNIT capsule Take by mouth every other day.    [provider]    Allergies Shellfish allergy and Aspirin  Family History  Problem Relation Age of Onset  . Cancer Mother   . Heart disease Father     Social History Social History  Substance Use Topics  . Smoking status: Former Smoker    Quit date: 1994  . Smokeless tobacco: Never Used  . Alcohol use No    Review of Systems Constitutional: No fever/chills Eyes: No visual changes. Cardiovascular: Denies chest pain. Respiratory: Denies shortness of breath. Gastrointestinal: No abdominal pain.  No nausea, no vomiting.   Genitourinary: Positive for dysuria.Positive for past UTIs. Musculoskeletal: Positive for chronic back pain, positive right lower extremity pain. Positive for right hip pain chronically. Skin: Negative for rash. Neurological: Negative for headaches, focal weakness or numbness.  ____________________________________________   PHYSICAL EXAM:  VITAL SIGNS: ED Triage Vitals  Enc Vitals Group     BP 03/14/17 1022 122/74     Pulse Rate  03/14/17 1022 96     Resp 03/14/17 1022 16     Temp 03/14/17 1022 98.8 F (37.1 C)     Temp Source 03/14/17 1022 Oral     SpO2 03/14/17 1022 98 %     Weight 03/14/17 1022 245 lb (111.1 kg)     Height 03/14/17 1022 5\' 11"  (1.803 m)     Head Circumference --      Peak Flow --      Pain Score 03/14/17 1021 9     Pain Loc --      Pain Edu? --      Excl. in [place]? --     Constitutional: Alert and oriented. Well appearing and in no acute distress. Eyes: Conjunctivae are normal.  Head: Atraumatic. Neck:  No stridor.   Cardiovascular: Normal rate, regular rhythm. Grossly normal heart sounds.  Good peripheral circulation. Respiratory: Normal respiratory effort.  No retractions. Lungs CTAB. Musculoskeletal:  Examination of the right lower extremity there is no gross deformity noted. There is tenderness on palpation of the anterior and medial right thigh. No discoloration or warmth is noted. There is no obvious swelling noted. There is some tenderness on palpation of the lymph nodes in the groin area. There is no erythema present. Left nodes are mobile with no enlargement. Skin surrounding this area does not show any injury or signs of infection. Patient resists any range of motion with her right leg. There is no effusion or edema noted to the right knee. No edema or tenderness is noted around the right ankle. Patient is noted on multiple visits to the exam room to be lying on her left side with her right knee drawn up and sleeping. Neurologic:  Normal speech and language. No gross focal neurologic deficits are appreciated.  Skin:  Skin is warm, dry and intact. No ecchymosis, abrasions, erythema present. Psychiatric: Mood and affect are normal. Speech and behavior are normal.   ____________________________________________   LABS (all labs ordered are listed, but only abnormal results are displayed)  Labs Reviewed  URINALYSIS, COMPLETE (UACMP) WITH MICROSCOPIC - Abnormal; Notable for the following:        Result Value   Color, Urine STRAW (*)    APPearance CLEAR (*)    Bacteria, UA RARE (*)    All other components within normal limits  CBC WITH DIFFERENTIAL/PLATELET    RADIOLOGY  No results found.  ____________________________________________   PROCEDURES  Procedure(s) performed: None  Procedures  Critical Care performed: No  ____________________________________________   INITIAL IMPRESSION / ASSESSMENT AND PLAN / ED COURSE  Pertinent labs & imaging results that were available during my care of the patient were reviewed by me and considered in my medical decision making (see chart for details).  Patient was very rude and verbally demanding towards staff. Patient requested pain medication and was told that because she drove that we could only give her an anti-inflammatory. Patient states that she will give her car keys to her daughter. Daughter came to the examining room and took the keys. Daughter verbally acknowledged that she is responsible for driving her mother home and then left to "get something to eat". Patient was reassured that urinalysis and CBC were normal. We also discussed results of her venous ultrasound and that results were negative for DVT. She is to follow-up with Dr. Ouida Sills who is her PCP for any continued concerns regarding her right leg pain.      ____________________________________________   FINAL CLINICAL IMPRESSION(S) / ED DIAGNOSES  Final diagnoses:  Right leg pain  Chronic pain of right lower extremity      NEW MEDICATIONS STARTED DURING THIS VISIT:  Discharge Medication List as of 03/14/2017  1:58 PM       Note:  This document was prepared using Dragon voice recognition software and may include unintentional dictation errors.    Johnn Hai, PA-C 03/16/17 5631    Delman Kitten, MD 03/21/17 [id number]

## 2017-03-14 NOTE — ED Notes (Signed)
Patient transported to Ultrasound 

## 2017-03-24 DIAGNOSIS — G5601 Carpal tunnel syndrome, right upper limb: Secondary | ICD-10-CM | POA: Diagnosis not present

## 2017-03-25 DIAGNOSIS — R300 Dysuria: Secondary | ICD-10-CM | POA: Diagnosis not present

## 2017-03-26 DIAGNOSIS — R32 Unspecified urinary incontinence: Secondary | ICD-10-CM | POA: Diagnosis not present

## 2017-03-27 DIAGNOSIS — G5601 Carpal tunnel syndrome, right upper limb: Secondary | ICD-10-CM | POA: Insufficient documentation

## 2017-04-04 DIAGNOSIS — M25531 Pain in right wrist: Secondary | ICD-10-CM | POA: Diagnosis not present

## 2017-04-04 DIAGNOSIS — G8929 Other chronic pain: Secondary | ICD-10-CM | POA: Diagnosis not present

## 2017-04-22 ENCOUNTER — Other Ambulatory Visit: Payer: Self-pay | Admitting: Physician Assistant

## 2017-04-22 ENCOUNTER — Ambulatory Visit
Admission: RE | Admit: 2017-04-22 | Discharge: 2017-04-22 | Disposition: A | Discharge status: Home / Self Care | Payer: Medicare HMO | Source: Ambulatory Visit | Attending: Physician Assistant | Admitting: Physician Assistant

## 2017-04-22 DIAGNOSIS — M62838 Other muscle spasm: Secondary | ICD-10-CM | POA: Diagnosis not present

## 2017-04-22 DIAGNOSIS — I700 Atherosclerosis of aorta: Secondary | ICD-10-CM | POA: Insufficient documentation

## 2017-04-22 DIAGNOSIS — R109 Unspecified abdominal pain: Secondary | ICD-10-CM

## 2017-04-22 DIAGNOSIS — E119 Type 2 diabetes mellitus without complications: Secondary | ICD-10-CM | POA: Diagnosis not present

## 2017-04-22 DIAGNOSIS — E0500 Thyrotoxicosis with diffuse goiter without thyrotoxic crisis or storm: Secondary | ICD-10-CM | POA: Diagnosis not present

## 2017-04-22 DIAGNOSIS — Z96641 Presence of right artificial hip joint: Secondary | ICD-10-CM | POA: Diagnosis not present

## 2017-04-22 DIAGNOSIS — E042 Nontoxic multinodular goiter: Secondary | ICD-10-CM | POA: Diagnosis not present

## 2017-04-22 DIAGNOSIS — R1012 Left upper quadrant pain: Secondary | ICD-10-CM | POA: Diagnosis not present

## 2017-04-24 DIAGNOSIS — K581 Irritable bowel syndrome with constipation: Secondary | ICD-10-CM | POA: Diagnosis not present

## 2017-04-24 DIAGNOSIS — R1084 Generalized abdominal pain: Secondary | ICD-10-CM | POA: Diagnosis not present

## 2017-04-25 ENCOUNTER — Other Ambulatory Visit (INDEPENDENT_AMBULATORY_CARE_PROVIDER_SITE_OTHER): Payer: Self-pay | Admitting: Vascular Surgery

## 2017-04-25 DIAGNOSIS — R1084 Generalized abdominal pain: Secondary | ICD-10-CM | POA: Diagnosis not present

## 2017-04-25 DIAGNOSIS — I7789 Other specified disorders of arteries and arterioles: Secondary | ICD-10-CM

## 2017-04-25 DIAGNOSIS — I739 Peripheral vascular disease, unspecified: Secondary | ICD-10-CM

## 2017-04-26 ENCOUNTER — Encounter: Payer: Self-pay | Admitting: Emergency Medicine

## 2017-04-26 ENCOUNTER — Emergency Department: Payer: Medicare HMO

## 2017-04-26 ENCOUNTER — Emergency Department
Admission: EM | Admit: 2017-04-26 | Discharge: 2017-04-26 | Disposition: A | Discharge status: Home / Self Care | Payer: Medicare HMO | Attending: Emergency Medicine | Admitting: Emergency Medicine

## 2017-04-26 DIAGNOSIS — M940 Chondrocostal junction syndrome [Tietze]: Secondary | ICD-10-CM | POA: Diagnosis not present

## 2017-04-26 DIAGNOSIS — Z79899 Other long term (current) drug therapy: Secondary | ICD-10-CM | POA: Diagnosis not present

## 2017-04-26 DIAGNOSIS — I10 Essential (primary) hypertension: Secondary | ICD-10-CM | POA: Insufficient documentation

## 2017-04-26 DIAGNOSIS — R0789 Other chest pain: Secondary | ICD-10-CM | POA: Diagnosis present

## 2017-04-26 DIAGNOSIS — Z791 Long term (current) use of non-steroidal anti-inflammatories (NSAID): Secondary | ICD-10-CM | POA: Diagnosis not present

## 2017-04-26 DIAGNOSIS — R079 Chest pain, unspecified: Secondary | ICD-10-CM | POA: Diagnosis not present

## 2017-04-26 DIAGNOSIS — Z87891 Personal history of nicotine dependence: Secondary | ICD-10-CM | POA: Diagnosis not present

## 2017-04-26 MED ORDER — HYDROMORPHONE HCL 1 MG/ML IJ SOLN
1.0000 mg | Freq: Once | INTRAMUSCULAR | Status: AC
  Administered 2017-04-26: 1 mg via INTRAMUSCULAR
  Filled 2017-04-26: qty 1

## 2017-04-26 MED ORDER — OXYCODONE-ACETAMINOPHEN 5-325 MG PO TABS: 1.0000 | ORAL_TABLET | Freq: Four times a day (QID) | ORAL | 0 refills | Status: DC | PRN

## 2017-04-26 MED ORDER — METHOCARBAMOL 750 MG PO TABS: 750.0000 mg | ORAL_TABLET | Freq: Four times a day (QID) | ORAL | 0 refills | Status: DC

## 2017-04-26 MED ORDER — ORPHENADRINE CITRATE 30 MG/ML IJ SOLN
60.0000 mg | Freq: Two times a day (BID) | INTRAMUSCULAR | Status: DC
  Administered 2017-04-26: 60 mg via INTRAMUSCULAR
  Filled 2017-04-26: qty 2

## 2017-04-26 NOTE — ED Notes (Signed)

## 2017-04-26 NOTE — ED Provider Notes (Signed)
[facility] Emergency Department Provider Note   ____________________________________________   First MD Initiated Contact with Patient 04/26/17 1433     (approximate)  I have reviewed the triage vital signs and the nursing notes.   HISTORY  Chief Complaint rib pain and Back Pain    HPI Christine Higgins is a 61 y.o. female patient complaining ofleft lateral rib and mid directed pain for 1 week. Patient states pain is worse with movement. Patient stated this been a history of prolonged coughing spells for 1 week. Patient denies any other provocative incident for her complaint.patient rates pain as 8/10. No palliative measures for complaint. Patient described a pain as "achy".   Past Medical History:  Diagnosis Date  . Degenerative disc disease, lumbar   . Fibromyalgia   . GERD (gastroesophageal reflux disease)   . Graves disease   . Hyperlipidemia   . Hypertension   . Thyroid disease     Patient Active Problem List   Diagnosis Date Noted  . Back pain at L4-L5 level 10/30/2015  . DDD (degenerative disc disease), lumbosacral 10/30/2015  . Leg pain 07/28/2015  . Primary osteoarthritis of both knees 07/28/2015    Past Surgical History:  Procedure Laterality Date  . BACK SURGERY     sumbar  . carpel tunn Right   . carpel tunnel Left   . CESAREAN SECTION    . EXCISION MORTON'S NEUROMA Left 02/05/2017   Procedure: EXCISION MORTON'S NEUROMA;  Surgeon: Samara Deist, DPM;  Location: [facility];  Service: Podiatry;  Laterality: Left;  iva with local  . HAND SURGERY Right    scar tissue removal  . NECK SURGERY     "disk implant"  . SHOULDER SURGERY Right    spur removal  . TOTAL HIP ARTHROPLASTY Right     Prior to Admission medications   Medication Sig Start Date End Date Taking? Authorizing Provider  acetaminophen (TYLENOL) 500 MG tablet Take 500 mg by mouth 2 (two) times daily. 2 tablets    [provider]  Ascorbic Acid  (VITAMIN C) 1000 MG tablet Take 1,000 mg by mouth every other day.    [provider]  atorvastatin (LIPITOR) 10 MG tablet Take 10 mg by mouth daily.    [provider]  Calcium-Magnesium-Vitamin D (CALCIUM 1200+D3 PO) Take by mouth every other day.    [provider]  clobetasol cream (TEMOVATE) 7.82 % Apply 1 application topically 2 (two) times daily.    [provider]  clonazePAM (KLONOPIN) 1 MG tablet Take 1 mg by mouth daily.    [provider]  clotrimazole-betamethasone (LOTRISONE) cream Apply 1 application topically 2 (two) times daily.    [provider]  DULoxetine (CYMBALTA) 60 MG capsule Take 60 mg by mouth daily.    [provider]  esomeprazole (NEXIUM) 40 MG capsule Take 40 mg by mouth 2 (two) times daily.    [provider]  gabapentin (NEURONTIN) 100 MG capsule Take 100 mg by mouth 3 (three) times daily as needed.    [provider]  HYDROcodone-acetaminophen (NORCO) 5-325 MG tablet Take 1 tablet by mouth every 6 (six) hours as needed for moderate pain. 03/14/17   Johnn Hai, PA-C  Linaclotide (LINZESS) 290 MCG CAPS capsule Take 290 mcg by mouth daily.    [provider]  losartan (COZAAR) 50 MG tablet Take 50 mg by mouth daily.    [provider]  meloxicam (MOBIC) 7.5 MG tablet Take  1 tablet (7.5 mg total) by mouth 2 (two) times daily after a meal. 10/06/15   Lance Bosch, MD  methimazole (TAPAZOLE) 10 MG tablet Take 10 mg by mouth daily. 3 tablets    [provider]  methocarbamol (ROBAXIN-750) 750 MG tablet Take 1 tablet (750 mg total) by mouth 4 (four) times daily. 04/26/17   Sable Feil, PA-C  Multiple Vitamin (MULTIVITAMIN) capsule Take 1 capsule by mouth every other day.    [provider]  Naproxen Sodium 220 MG CAPS Take by mouth 2 (two) times daily as needed.    [provider]  oxyCODONE-acetaminophen (ROXICET) 5-325 MG tablet Take 1  tablet by mouth every 6 (six) hours as needed for moderate pain. 04/26/17   Sable Feil, PA-C  polyethylene glycol [facility] / GLYCOLAX) packet Take 17 g by mouth as needed.    [provider]  sucralfate (CARAFATE) 1 g tablet Take 1 g by mouth 4 (four) times daily.    [provider]  tiZANidine (ZANAFLEX) 4 MG capsule Take 4 mg by mouth 3 (three) times daily.    [provider]  traZODone (DESYREL) 150 MG tablet Take by mouth at bedtime.    [provider]  triamterene-hydrochlorothiazide (MAXZIDE) 75-50 MG tablet Take 1 tablet by mouth daily.    [provider]  vitamin B-12 (CYANOCOBALAMIN) 100 MCG tablet Take 100 mcg by mouth every other day.    [provider]  vitamin E 100 UNIT capsule Take by mouth every other day.    [provider]    Allergies Shellfish allergy and Aspirin  Family History  Problem Relation Age of Onset  . Cancer Mother   . Heart disease Father     Social History Social History  Substance Use Topics  . Smoking status: Former Smoker    Quit date: 1994  . Smokeless tobacco: Never Used  . Alcohol use No    Review of Systems Constitutional: No fever/chills Eyes: No visual changes. ENT: No sore throat. Cardiovascular: Denies chest pain. Respiratory: Denies shortness of breath. Gastrointestinal: No abdominal pain.  No nausea, no vomiting.  No diarrhea.  No constipation. Genitourinary: Negative for dysuria. Musculoskeletal:left lateral chest wall pain Skin: Negative for rash. Neurological: Negative for headaches, focal weakness or numbness. Endocrine:hyperlipidemia, hypertension, and hypothyroidism. Allergic/Immunilogical: aspirin and shellfish____________________________________________   PHYSICAL EXAM:  VITAL SIGNS: ED Triage Vitals  Enc Vitals Group     BP 04/26/17 1327 140/86     Pulse Rate 04/26/17 1327 97     Resp 04/26/17 1327 18     Temp 04/26/17 1327 98.2 F (36.8 C)      Temp Source 04/26/17 1327 Oral     SpO2 04/26/17 1327 98 %     Weight 04/26/17 1327 250 lb (113.4 kg)     Height 04/26/17 1327 5\' 11"  (1.803 m)     Head Circumference --      Peak Flow --      Pain Score 04/26/17 1326 8     Pain Loc --      Pain Edu? --      Excl. in [employer]? --     Constitutional: Alert and oriented. Well appearing and in no acute distress. Cardiovascular: Normal rate, regular rhythm. Grossly normal heart sounds.  Good peripheral circulation. Respiratory: Normal respiratory effort.  No retractions. Lungs CTAB. Gastrointestinal: Soft and nontender. No distention. No abdominal bruits. No CVA tenderness. Genitourinary: deferred Musculoskeletal: no chest wall deformity. Moderate guarding with  palpation left lateral chest wall ribs 7 through 10. No lower extremity tenderness nor edema.  No joint effusions. Neurologic:  Normal speech and language. No gross focal neurologic deficits are appreciated. No gait instability. Skin:  Skin is warm, dry and intact. No rash noted. Psychiatric: Mood and affect are normal. Speech and behavior are normal.  ____________________________________________   LABS (all labs ordered are listed, but only abnormal results are displayed)  Labs Reviewed - No data to display ____________________________________________  EKG   ____________________________________________  RADIOLOGY  Dg Chest 2 View  Result Date: 04/26/2017 CLINICAL DATA:  Left chest pain for 1 week. EXAM: CHEST  2 VIEW COMPARISON:  Chest CT March 20, 2016 FINDINGS: The heart size and mediastinal contours are within normal limits. Both lungs are clear. The visualized skeletal structures are unremarkable. IMPRESSION: No active cardiopulmonary disease. Electronically Signed   By: Abelardo Diesel M.D.   On: 04/26/2017 15:09    ____________________________________________   PROCEDURES  Procedure(s) performed: None  Procedures  Critical Care performed:  No  ____________________________________________   INITIAL IMPRESSION / ASSESSMENT AND PLAN / ED COURSE  Pertinent labs & imaging results that were available during my care of the patient were reviewed by me and considered in my medical decision making (see chart for details).  Chest wall pain secondary to coughing spells. Discussed negative chest x-ray finding with patient. Patient given discharge care instructions.patient advised to follow-up with PCP in one week if no improvement.      ____________________________________________   FINAL CLINICAL IMPRESSION(S) / ED DIAGNOSES  Final diagnoses:  Costochondritis      NEW MEDICATIONS STARTED DURING THIS VISIT:  New Prescriptions   METHOCARBAMOL (ROBAXIN-750) 750 MG TABLET    Take 1 tablet (750 mg total) by mouth 4 (four) times daily.   OXYCODONE-ACETAMINOPHEN (ROXICET) 5-325 MG TABLET    Take 1 tablet by mouth every 6 (six) hours as needed for moderate pain.     Note:  This document was prepared using Dragon voice recognition software and may include unintentional dictation errors.    Sable Feil, PA-C 04/26/17 1534    Lavonia Drafts, MD 04/27/17 [phone]

## 2017-04-26 NOTE — ED Triage Notes (Signed)
Pt to ed with c/o left sided rib pain and mid thoracic back pain.  Worse with movement and worse with palpation of area x 1 week.  Pt denies known injury.

## 2017-04-29 ENCOUNTER — Ambulatory Visit (INDEPENDENT_AMBULATORY_CARE_PROVIDER_SITE_OTHER): Payer: Medicare HMO | Admitting: Vascular Surgery

## 2017-04-29 ENCOUNTER — Ambulatory Visit (INDEPENDENT_AMBULATORY_CARE_PROVIDER_SITE_OTHER): Payer: Medicare HMO

## 2017-04-29 DIAGNOSIS — I779 Disorder of arteries and arterioles, unspecified: Secondary | ICD-10-CM

## 2017-04-29 DIAGNOSIS — E042 Nontoxic multinodular goiter: Secondary | ICD-10-CM | POA: Diagnosis not present

## 2017-04-29 DIAGNOSIS — I7789 Other specified disorders of arteries and arterioles: Secondary | ICD-10-CM

## 2017-04-29 DIAGNOSIS — I739 Peripheral vascular disease, unspecified: Secondary | ICD-10-CM | POA: Diagnosis not present

## 2017-04-29 DIAGNOSIS — E0500 Thyrotoxicosis with diffuse goiter without thyrotoxic crisis or storm: Secondary | ICD-10-CM | POA: Diagnosis not present

## 2017-04-30 DIAGNOSIS — R32 Unspecified urinary incontinence: Secondary | ICD-10-CM | POA: Diagnosis not present

## 2017-05-02 ENCOUNTER — Encounter (INDEPENDENT_AMBULATORY_CARE_PROVIDER_SITE_OTHER): Payer: Self-pay | Admitting: Vascular Surgery

## 2017-05-05 ENCOUNTER — Other Ambulatory Visit: Payer: Self-pay | Admitting: Obstetrics and Gynecology

## 2017-05-05 DIAGNOSIS — N6320 Unspecified lump in the left breast, unspecified quadrant: Secondary | ICD-10-CM | POA: Diagnosis not present

## 2017-05-05 DIAGNOSIS — G894 Chronic pain syndrome: Secondary | ICD-10-CM | POA: Diagnosis not present

## 2017-05-05 DIAGNOSIS — Z1211 Encounter for screening for malignant neoplasm of colon: Secondary | ICD-10-CM | POA: Diagnosis not present

## 2017-05-05 DIAGNOSIS — B373 Candidiasis of vulva and vagina: Secondary | ICD-10-CM | POA: Diagnosis not present

## 2017-05-05 DIAGNOSIS — B3789 Other sites of candidiasis: Secondary | ICD-10-CM | POA: Diagnosis not present

## 2017-05-05 DIAGNOSIS — Z01419 Encounter for gynecological examination (general) (routine) without abnormal findings: Secondary | ICD-10-CM | POA: Diagnosis not present

## 2017-05-05 DIAGNOSIS — N898 Other specified noninflammatory disorders of vagina: Secondary | ICD-10-CM | POA: Diagnosis not present

## 2017-05-13 DIAGNOSIS — M1712 Unilateral primary osteoarthritis, left knee: Secondary | ICD-10-CM | POA: Diagnosis not present

## 2017-05-13 DIAGNOSIS — R109 Unspecified abdominal pain: Secondary | ICD-10-CM | POA: Diagnosis not present

## 2017-05-13 DIAGNOSIS — E119 Type 2 diabetes mellitus without complications: Secondary | ICD-10-CM | POA: Diagnosis not present

## 2017-05-19 ENCOUNTER — Ambulatory Visit
Admission: RE | Admit: 2017-05-19 | Discharge: 2017-05-19 | Disposition: A | Discharge status: Home / Self Care | Payer: Medicare HMO | Source: Ambulatory Visit | Attending: Obstetrics and Gynecology | Admitting: Obstetrics and Gynecology

## 2017-05-19 DIAGNOSIS — M9902 Segmental and somatic dysfunction of thoracic region: Secondary | ICD-10-CM | POA: Diagnosis not present

## 2017-05-19 DIAGNOSIS — M5136 Other intervertebral disc degeneration, lumbar region: Secondary | ICD-10-CM | POA: Diagnosis not present

## 2017-05-19 DIAGNOSIS — L723 Sebaceous cyst: Secondary | ICD-10-CM | POA: Insufficient documentation

## 2017-05-19 DIAGNOSIS — N6320 Unspecified lump in the left breast, unspecified quadrant: Secondary | ICD-10-CM

## 2017-05-19 DIAGNOSIS — M5134 Other intervertebral disc degeneration, thoracic region: Secondary | ICD-10-CM | POA: Diagnosis not present

## 2017-05-19 HISTORY — DX: Unspecified lump in unspecified breast: N63.0

## 2017-05-20 DIAGNOSIS — M5412 Radiculopathy, cervical region: Secondary | ICD-10-CM | POA: Diagnosis not present

## 2017-05-20 DIAGNOSIS — M5030 Other cervical disc degeneration, unspecified cervical region: Secondary | ICD-10-CM | POA: Diagnosis not present

## 2017-05-20 DIAGNOSIS — M5416 Radiculopathy, lumbar region: Secondary | ICD-10-CM | POA: Diagnosis not present

## 2017-05-20 DIAGNOSIS — M62838 Other muscle spasm: Secondary | ICD-10-CM | POA: Diagnosis not present

## 2017-05-20 DIAGNOSIS — M5136 Other intervertebral disc degeneration, lumbar region: Secondary | ICD-10-CM | POA: Diagnosis not present

## 2017-05-20 DIAGNOSIS — G5601 Carpal tunnel syndrome, right upper limb: Secondary | ICD-10-CM | POA: Diagnosis not present

## 2017-05-22 DIAGNOSIS — M5134 Other intervertebral disc degeneration, thoracic region: Secondary | ICD-10-CM | POA: Diagnosis not present

## 2017-05-22 DIAGNOSIS — M5136 Other intervertebral disc degeneration, lumbar region: Secondary | ICD-10-CM | POA: Diagnosis not present

## 2017-05-22 DIAGNOSIS — M9902 Segmental and somatic dysfunction of thoracic region: Secondary | ICD-10-CM | POA: Diagnosis not present

## 2017-05-23 DIAGNOSIS — Z0100 Encounter for examination of eyes and vision without abnormal findings: Secondary | ICD-10-CM | POA: Diagnosis not present

## 2017-05-23 DIAGNOSIS — H524 Presbyopia: Secondary | ICD-10-CM | POA: Diagnosis not present

## 2017-05-27 DIAGNOSIS — M5134 Other intervertebral disc degeneration, thoracic region: Secondary | ICD-10-CM | POA: Diagnosis not present

## 2017-05-27 DIAGNOSIS — M5136 Other intervertebral disc degeneration, lumbar region: Secondary | ICD-10-CM | POA: Diagnosis not present

## 2017-05-27 DIAGNOSIS — Z6835 Body mass index (BMI) 35.0-35.9, adult: Secondary | ICD-10-CM | POA: Diagnosis not present

## 2017-05-27 DIAGNOSIS — B029 Zoster without complications: Secondary | ICD-10-CM | POA: Diagnosis not present

## 2017-05-27 DIAGNOSIS — E119 Type 2 diabetes mellitus without complications: Secondary | ICD-10-CM | POA: Diagnosis not present

## 2017-05-27 DIAGNOSIS — E7800 Pure hypercholesterolemia, unspecified: Secondary | ICD-10-CM | POA: Diagnosis not present

## 2017-05-27 DIAGNOSIS — M9902 Segmental and somatic dysfunction of thoracic region: Secondary | ICD-10-CM | POA: Diagnosis not present

## 2017-05-27 DIAGNOSIS — I10 Essential (primary) hypertension: Secondary | ICD-10-CM | POA: Diagnosis not present

## 2017-05-30 DIAGNOSIS — M5134 Other intervertebral disc degeneration, thoracic region: Secondary | ICD-10-CM | POA: Diagnosis not present

## 2017-05-30 DIAGNOSIS — M5136 Other intervertebral disc degeneration, lumbar region: Secondary | ICD-10-CM | POA: Diagnosis not present

## 2017-05-30 DIAGNOSIS — R32 Unspecified urinary incontinence: Secondary | ICD-10-CM | POA: Diagnosis not present

## 2017-05-30 DIAGNOSIS — M9902 Segmental and somatic dysfunction of thoracic region: Secondary | ICD-10-CM | POA: Diagnosis not present

## 2017-06-03 DIAGNOSIS — I10 Essential (primary) hypertension: Secondary | ICD-10-CM | POA: Diagnosis not present

## 2017-06-03 DIAGNOSIS — M5134 Other intervertebral disc degeneration, thoracic region: Secondary | ICD-10-CM | POA: Diagnosis not present

## 2017-06-03 DIAGNOSIS — R300 Dysuria: Secondary | ICD-10-CM | POA: Diagnosis not present

## 2017-06-03 DIAGNOSIS — Z6835 Body mass index (BMI) 35.0-35.9, adult: Secondary | ICD-10-CM | POA: Diagnosis not present

## 2017-06-03 DIAGNOSIS — F325 Major depressive disorder, single episode, in full remission: Secondary | ICD-10-CM | POA: Diagnosis not present

## 2017-06-03 DIAGNOSIS — M5136 Other intervertebral disc degeneration, lumbar region: Secondary | ICD-10-CM | POA: Diagnosis not present

## 2017-06-03 DIAGNOSIS — M9902 Segmental and somatic dysfunction of thoracic region: Secondary | ICD-10-CM | POA: Diagnosis not present

## 2017-06-03 DIAGNOSIS — Z23 Encounter for immunization: Secondary | ICD-10-CM | POA: Diagnosis not present

## 2017-06-03 DIAGNOSIS — E119 Type 2 diabetes mellitus without complications: Secondary | ICD-10-CM | POA: Diagnosis not present

## 2017-06-03 DIAGNOSIS — E7800 Pure hypercholesterolemia, unspecified: Secondary | ICD-10-CM | POA: Diagnosis not present

## 2017-06-03 DIAGNOSIS — I700 Atherosclerosis of aorta: Secondary | ICD-10-CM | POA: Diagnosis not present

## 2017-06-12 DIAGNOSIS — M5136 Other intervertebral disc degeneration, lumbar region: Secondary | ICD-10-CM | POA: Diagnosis not present

## 2017-06-12 DIAGNOSIS — M5134 Other intervertebral disc degeneration, thoracic region: Secondary | ICD-10-CM | POA: Diagnosis not present

## 2017-06-12 DIAGNOSIS — M9902 Segmental and somatic dysfunction of thoracic region: Secondary | ICD-10-CM | POA: Diagnosis not present

## 2017-06-13 DIAGNOSIS — M5134 Other intervertebral disc degeneration, thoracic region: Secondary | ICD-10-CM | POA: Diagnosis not present

## 2017-06-13 DIAGNOSIS — M5136 Other intervertebral disc degeneration, lumbar region: Secondary | ICD-10-CM | POA: Diagnosis not present

## 2017-06-13 DIAGNOSIS — M9902 Segmental and somatic dysfunction of thoracic region: Secondary | ICD-10-CM | POA: Diagnosis not present

## 2017-06-17 DIAGNOSIS — M9902 Segmental and somatic dysfunction of thoracic region: Secondary | ICD-10-CM | POA: Diagnosis not present

## 2017-06-17 DIAGNOSIS — M5134 Other intervertebral disc degeneration, thoracic region: Secondary | ICD-10-CM | POA: Diagnosis not present

## 2017-06-17 DIAGNOSIS — M5136 Other intervertebral disc degeneration, lumbar region: Secondary | ICD-10-CM | POA: Diagnosis not present

## 2017-06-20 DIAGNOSIS — M5136 Other intervertebral disc degeneration, lumbar region: Secondary | ICD-10-CM | POA: Diagnosis not present

## 2017-06-20 DIAGNOSIS — R229 Localized swelling, mass and lump, unspecified: Secondary | ICD-10-CM | POA: Diagnosis not present

## 2017-06-20 DIAGNOSIS — M25512 Pain in left shoulder: Secondary | ICD-10-CM | POA: Diagnosis not present

## 2017-06-20 DIAGNOSIS — M9902 Segmental and somatic dysfunction of thoracic region: Secondary | ICD-10-CM | POA: Diagnosis not present

## 2017-06-20 DIAGNOSIS — M5134 Other intervertebral disc degeneration, thoracic region: Secondary | ICD-10-CM | POA: Diagnosis not present

## 2017-06-23 DIAGNOSIS — M4802 Spinal stenosis, cervical region: Secondary | ICD-10-CM | POA: Insufficient documentation

## 2017-06-23 DIAGNOSIS — F411 Generalized anxiety disorder: Secondary | ICD-10-CM | POA: Insufficient documentation

## 2017-06-23 DIAGNOSIS — F5101 Primary insomnia: Secondary | ICD-10-CM | POA: Insufficient documentation

## 2017-06-23 DIAGNOSIS — M48062 Spinal stenosis, lumbar region with neurogenic claudication: Secondary | ICD-10-CM | POA: Diagnosis not present

## 2017-06-23 DIAGNOSIS — M9983 Other biomechanical lesions of lumbar region: Secondary | ICD-10-CM | POA: Diagnosis not present

## 2017-06-23 DIAGNOSIS — M4800 Spinal stenosis, site unspecified: Secondary | ICD-10-CM | POA: Insufficient documentation

## 2017-06-23 DIAGNOSIS — M47816 Spondylosis without myelopathy or radiculopathy, lumbar region: Secondary | ICD-10-CM | POA: Insufficient documentation

## 2017-06-23 DIAGNOSIS — M961 Postlaminectomy syndrome, not elsewhere classified: Secondary | ICD-10-CM | POA: Diagnosis not present

## 2017-06-24 ENCOUNTER — Ambulatory Visit (INDEPENDENT_AMBULATORY_CARE_PROVIDER_SITE_OTHER): Payer: Medicare HMO | Admitting: Urology

## 2017-06-24 ENCOUNTER — Encounter: Payer: Self-pay | Admitting: Urology

## 2017-06-24 VITALS — BP 105/67 | HR 89 | Ht 71.0 in | Wt 260.0 lb

## 2017-06-24 DIAGNOSIS — M5134 Other intervertebral disc degeneration, thoracic region: Secondary | ICD-10-CM | POA: Diagnosis not present

## 2017-06-24 DIAGNOSIS — M9902 Segmental and somatic dysfunction of thoracic region: Secondary | ICD-10-CM | POA: Diagnosis not present

## 2017-06-24 DIAGNOSIS — R350 Frequency of micturition: Secondary | ICD-10-CM | POA: Diagnosis not present

## 2017-06-24 DIAGNOSIS — M5136 Other intervertebral disc degeneration, lumbar region: Secondary | ICD-10-CM | POA: Diagnosis not present

## 2017-06-24 DIAGNOSIS — R300 Dysuria: Secondary | ICD-10-CM | POA: Diagnosis not present

## 2017-06-24 DIAGNOSIS — R102 Pelvic and perineal pain: Secondary | ICD-10-CM | POA: Insufficient documentation

## 2017-06-24 LAB — URINALYSIS, COMPLETE
Bilirubin, UA: NEGATIVE
Glucose, UA: NEGATIVE
Ketones, UA: NEGATIVE
Leukocytes, UA: NEGATIVE
Nitrite, UA: NEGATIVE
Protein, UA: NEGATIVE
RBC, UA: NEGATIVE
Specific Gravity, UA: 1.015 (ref 1.005–1.030)
Urobilinogen, Ur: 0.2 mg/dL (ref 0.2–1.0)
pH, UA: 6.5 (ref 5.0–7.5)

## 2017-06-24 LAB — BLADDER SCAN AMB NON-IMAGING

## 2017-06-24 MED ORDER — SOLIFENACIN SUCCINATE 10 MG PO TABS: 10.0000 mg | ORAL_TABLET | Freq: Every day | ORAL | 0 refills | Status: DC

## 2017-06-24 NOTE — Progress Notes (Signed)
06/24/2017 11:00 AM   Walker Shadow [birth year] [id number]  Referring provider: Kirk Ruths, MD [facility] [place], [place] [address]  Chief Complaint  Patient presents with  . Dysuria    New Patient    HPI: Christine Higgins is a 61 y.o. female who presents today in consultation at the request of Dr. Ouida Sills for evaluation of pelvic pain and lower urinary tract symptoms.  She relates to a 56-month history of right groin pain which radiates to the anterior and medial thigh.  Her pain is worse with walking and weightbearing.  She also complains of urinary frequency, urgency and urge incontinence.  She complains of discomfort deep in the pelvis when voiding.  She has a several year history of urinary frequency however her voiding symptoms have also worsened over the past 4 months.  She denies gross hematuria.  She has radiation of her groin pain in the right back region.  When voiding she feels her urine is "hot" and has a strong odor.  Urinalyses have been negative for infection.  She has a history of degenerative disc disease.  She had a noncontrast CT of the abdomen and pelvis performed August 2018 which showed no significant abnormalities.   PMH: Past Medical History:  Diagnosis Date  . Breast mass    LEFT x 3 months per pt and palpated by physician  . Degenerative disc disease, lumbar   . Fibromyalgia   . GERD (gastroesophageal reflux disease)   . Graves disease   . Hyperlipidemia   . Hypertension   . Thyroid disease     Surgical History: Past Surgical History:  Procedure Laterality Date  . BACK SURGERY     sumbar  . carpel tunn Right   . carpel tunnel Left   . CESAREAN SECTION    . EXCISION MORTON'S NEUROMA Left 02/05/2017   Procedure: EXCISION MORTON'S NEUROMA;  Surgeon: Samara Deist, DPM;  Location: [facility];  Service: Podiatry;  Laterality: Left;  iva with local  . HAND SURGERY Right    scar tissue removal  . NECK  SURGERY     "disk implant"  . SHOULDER SURGERY Right    spur removal  . TOTAL HIP ARTHROPLASTY Right     Home Medications:  Allergies as of 06/24/2017      Reactions   Shellfish Allergy Nausea And Vomiting   Aspirin Nausea And Vomiting      Medication List       Accurate as of 06/24/17 11:00 AM. Always use your most recent med list.          acetaminophen 500 MG tablet Commonly known as:  TYLENOL Take 500 mg by mouth 2 (two) times daily. 2 tablets   atorvastatin 10 MG tablet Commonly known as:  LIPITOR Take 10 mg by mouth daily.   CALCIUM 1200+D3 PO Take by mouth every other day.   clobetasol cream 0.05 % Commonly known as:  TEMOVATE Apply 1 application topically 2 (two) times daily.   clonazePAM 1 MG tablet Commonly known as:  KLONOPIN Take 1 mg by mouth daily.   clotrimazole-betamethasone cream Commonly known as:  LOTRISONE Apply 1 application topically 2 (two) times daily.   DULoxetine 60 MG capsule Commonly known as:  CYMBALTA Take 60 mg by mouth daily.   esomeprazole 40 MG capsule Commonly known as:  NEXIUM Take 40 mg by mouth 2 (two) times daily.   gabapentin 100 MG capsule Commonly known as:  NEURONTIN Take 100 mg by mouth 3 (three) times daily as needed.   HYDROcodone-acetaminophen 5-325 MG tablet Commonly known as:  NORCO Take 1 tablet by mouth every 6 (six) hours as needed for moderate pain.   LINZESS 290 MCG Caps capsule Generic drug:  linaclotide Take 290 mcg by mouth daily.   losartan 50 MG tablet Commonly known as:  COZAAR Take 50 mg by mouth daily.   meloxicam 7.5 MG tablet Commonly known as:  MOBIC Take 1 tablet (7.5 mg total) by mouth 2 (two) times daily after a meal.   methimazole 10 MG tablet Commonly known as:  TAPAZOLE Take 10 mg by mouth daily. 3 tablets   methocarbamol 750 MG tablet Commonly known as:  ROBAXIN-750 Take 1 tablet (750 mg total) by mouth 4 (four) times daily.   multivitamin capsule Take 1 capsule by  mouth every other day.   Naproxen Sodium 220 MG Caps Take by mouth 2 (two) times daily as needed.   oxyCODONE-acetaminophen 5-325 MG tablet Commonly known as:  ROXICET Take 1 tablet by mouth every 6 (six) hours as needed for moderate pain.   polyethylene glycol packet Commonly known as:  MIRALAX / GLYCOLAX Take 17 g by mouth as needed.   sucralfate 1 g tablet Commonly known as:  CARAFATE Take 1 g by mouth 4 (four) times daily.   tiZANidine 4 MG capsule Commonly known as:  ZANAFLEX Take 4 mg by mouth 3 (three) times daily.   traZODone 150 MG tablet Commonly known as:  DESYREL Take by mouth at bedtime.   triamterene-hydrochlorothiazide 75-50 MG tablet Commonly known as:  MAXZIDE Take 1 tablet by mouth daily.   vitamin B-12 100 MCG tablet Commonly known as:  CYANOCOBALAMIN Take 100 mcg by mouth every other day.   vitamin C 1000 MG tablet Take 1,000 mg by mouth every other day.   vitamin E 100 UNIT capsule Take by mouth every other day.       Allergies:  Allergies  Allergen Reactions  . Shellfish Allergy Nausea And Vomiting  . Aspirin Nausea And Vomiting    Family History: Family History  Problem Relation Age of Onset  . Cancer Mother   . Heart disease Father   . Breast cancer Neg Hx     Social History:  reports that she quit smoking about 24 years ago. She has never used smokeless tobacco. She reports that she does not drink alcohol or use drugs.  ROS: UROLOGY Frequent Urination?: Yes Hard to postpone urination?: Yes Burning/pain with urination?: Yes Get up at night to urinate?: Yes Leakage of urine?: Yes Urine stream starts and stops?: Yes Trouble starting stream?: Yes Do you have to strain to urinate?: No Blood in urine?: No Urinary tract infection?: No Sexually transmitted disease?: No Injury to kidneys or bladder?: No Painful intercourse?: Yes Weak stream?: No Currently pregnant?: No Vaginal bleeding?: No Last menstrual period?:  n  Gastrointestinal Nausea?: No Vomiting?: No Indigestion/heartburn?: Yes Diarrhea?: No Constipation?: Yes  Constitutional Fever: No Night sweats?: Yes Weight loss?: No Fatigue?: Yes  Skin Skin rash/lesions?: Yes Itching?: Yes  Eyes Blurred vision?: Yes Double vision?: No  Ears/Nose/Throat Sore throat?: No Sinus problems?: Yes  Hematologic/Lymphatic Swollen glands?: Yes Easy bruising?: Yes  Cardiovascular Leg swelling?: No Chest pain?: No  Respiratory Cough?: No Shortness of breath?: Yes  Endocrine Excessive thirst?: Yes  Musculoskeletal Back pain?: Yes Joint pain?: Yes  Neurological Headaches?: Yes Dizziness?: Yes  Psychologic Depression?: No Anxiety?: Yes  Physical Exam: BP  105/67   Pulse 89   Ht 5\' 11"  (1.803 m)   Wt 260 lb (117.9 kg)   BMI 36.26 kg/m   Constitutional:  Alert and oriented, No acute distress. HEENT: [place] AT, moist mucus membranes.  Trachea midline, no masses. Cardiovascular: No clubbing, cyanosis, or edema. Respiratory: Normal respiratory effort, no increased work of breathing. GI: Abdomen is soft, nontender, nondistended, no abdominal masses GU: No CVA tenderness.  Mild tenderness right groin region Skin: No rashes, bruises or suspicious lesions. Lymph: No cervical or inguinal adenopathy. Neurologic: Grossly intact, no focal deficits, moving all 4 extremities. Psychiatric: Normal mood and affect.  Laboratory Data: Lab Results  Component Value Date   WBC 4.7 03/14/2017   HGB 13.2 03/14/2017   HCT 39.1 03/14/2017   MCV 82.0 03/14/2017   PLT 169 03/14/2017    Lab Results  Component Value Date   CREATININE 0.84 12/14/2016    Urinalysis Lab Results  Component Value Date   APPEARANCEUR CLEAR (A) 03/14/2017   LEUKOCYTESUR NEGATIVE 03/14/2017   PROTEINUR NEGATIVE 03/14/2017   GLUCOSEU NEGATIVE 03/14/2017   RBCU 0-5 03/14/2017   BILIRUBINUR NEGATIVE 03/14/2017   NITRITE NEGATIVE 03/14/2017    Lab Results   Component Value Date   BACTERIA RARE (A) 03/14/2017    Pertinent Imaging:  Results for orders placed during the hospital encounter of 04/22/17  CT RENAL STONE STUDY   Narrative CLINICAL DATA:  Left flank pain radiating under the left rib cage for 5 days, no hematuria, no history of kidney stones  EXAM: CT ABDOMEN AND PELVIS WITHOUT CONTRAST  TECHNIQUE: Multidetector CT imaging of the abdomen and pelvis was performed following the standard protocol without IV contrast.  COMPARISON:  CT abdomen pelvis of 12/14/2016  FINDINGS: Lower chest: The lung bases are clear. The heart is within normal limits in size.  Hepatobiliary: The liver is unremarkable in the unenhanced state. No calcified gallstones are seen.  Pancreas: The pancreas is normal in size and the pancreatic duct is not dilated.  Spleen: The spleen is unremarkable.  Adrenals/Urinary Tract: The adrenal glands appear normal. No hydronephrosis is seen. No renal calculi are noted. The ureters appear normal in caliber. The urinary bladder is not optimally seen due to linear artifacts created by right total hip replacement but no bladder lesion is evident.  Stomach/Bowel: The stomach is largely decompressed. No small bowel distention is seen. There is feces throughout the colon. The terminal ileum is unremarkable and the appendix fills with air normally.  Vascular/Lymphatic: The abdominal aorta is normal in caliber with moderate abdominal aortic atherosclerosis present. No adenopathy is seen.  Reproductive: The uterus is normal in size. No adnexal lesion is seen, but again portions of the pelvis are obscured by linear artifacts created by the right total hip replacement.  Other: None.  Musculoskeletal: The lumbar vertebrae are in normal alignment with normal intervertebral disc spaces. Right total hip replacement is noted.  IMPRESSION: 1. No explanation for the patient's left flank pain is seen. No renal or  ureteral calculi are noted. 2. Moderate abdominal aortic atherosclerosis. 3. Right hip replacement.   Electronically Signed   By: Ivar Drape M.D.   On: 04/22/2017 10:50     Assessment & Plan:    1.  Groin pain More suspicious for a neuropathic etiology and unlikely genitourinary.  - Urinalysis, Complete  2.  Lower urinary tract symptoms Urinalysis today was unremarkable.  PVR by bladder scan was 63 mL.  I have recommended an initial trial  of anticholinergic therapy and Rx Vesicare was sent to her pharmacy.  Follow-up in approximately 1 month for cystoscopy.  - BLADDER SCAN AMB NON-IMAGING    Abbie Sons, MD  [facility] [address], [place], [place] [address] [phone]

## 2017-06-25 ENCOUNTER — Encounter: Payer: Self-pay | Admitting: *Deleted

## 2017-06-26 ENCOUNTER — Ambulatory Visit
Admission: RE | Admit: 2017-06-26 | Discharge: 2017-06-26 | Disposition: A | Discharge status: Home / Self Care | Payer: Medicare HMO | Source: Ambulatory Visit | Attending: Gastroenterology | Admitting: Gastroenterology

## 2017-06-26 ENCOUNTER — Ambulatory Visit: Payer: Medicare HMO | Admitting: Anesthesiology

## 2017-06-26 ENCOUNTER — Encounter
Admission: RE | Disposition: A | Discharge status: Home / Self Care | Payer: Self-pay | Source: Ambulatory Visit | Attending: Gastroenterology

## 2017-06-26 DIAGNOSIS — K3189 Other diseases of stomach and duodenum: Secondary | ICD-10-CM | POA: Insufficient documentation

## 2017-06-26 DIAGNOSIS — G894 Chronic pain syndrome: Secondary | ICD-10-CM | POA: Diagnosis not present

## 2017-06-26 DIAGNOSIS — E785 Hyperlipidemia, unspecified: Secondary | ICD-10-CM | POA: Diagnosis not present

## 2017-06-26 DIAGNOSIS — I10 Essential (primary) hypertension: Secondary | ICD-10-CM | POA: Diagnosis not present

## 2017-06-26 DIAGNOSIS — K219 Gastro-esophageal reflux disease without esophagitis: Secondary | ICD-10-CM | POA: Diagnosis not present

## 2017-06-26 DIAGNOSIS — Z79899 Other long term (current) drug therapy: Secondary | ICD-10-CM | POA: Diagnosis not present

## 2017-06-26 DIAGNOSIS — K2970 Gastritis, unspecified, without bleeding: Secondary | ICD-10-CM | POA: Insufficient documentation

## 2017-06-26 DIAGNOSIS — M797 Fibromyalgia: Secondary | ICD-10-CM | POA: Diagnosis not present

## 2017-06-26 DIAGNOSIS — H409 Unspecified glaucoma: Secondary | ICD-10-CM | POA: Insufficient documentation

## 2017-06-26 DIAGNOSIS — Z886 Allergy status to analgesic agent status: Secondary | ICD-10-CM | POA: Insufficient documentation

## 2017-06-26 DIAGNOSIS — K2960 Other gastritis without bleeding: Secondary | ICD-10-CM | POA: Diagnosis not present

## 2017-06-26 DIAGNOSIS — K228 Other specified diseases of esophagus: Secondary | ICD-10-CM | POA: Diagnosis not present

## 2017-06-26 DIAGNOSIS — Z91013 Allergy to seafood: Secondary | ICD-10-CM | POA: Diagnosis not present

## 2017-06-26 DIAGNOSIS — Z87891 Personal history of nicotine dependence: Secondary | ICD-10-CM | POA: Insufficient documentation

## 2017-06-26 DIAGNOSIS — K210 Gastro-esophageal reflux disease with esophagitis: Secondary | ICD-10-CM | POA: Diagnosis not present

## 2017-06-26 DIAGNOSIS — E0500 Thyrotoxicosis with diffuse goiter without thyrotoxic crisis or storm: Secondary | ICD-10-CM | POA: Insufficient documentation

## 2017-06-26 DIAGNOSIS — K2980 Duodenitis without bleeding: Secondary | ICD-10-CM | POA: Diagnosis not present

## 2017-06-26 DIAGNOSIS — K2990 Gastroduodenitis, unspecified, without bleeding: Secondary | ICD-10-CM | POA: Diagnosis not present

## 2017-06-26 HISTORY — DX: Unspecified hemorrhoids: K64.9

## 2017-06-26 HISTORY — DX: Cervical disc disorder, unspecified, unspecified cervical region: M50.90

## 2017-06-26 HISTORY — DX: Chronic pain syndrome: G89.4

## 2017-06-26 HISTORY — DX: Headache, unspecified: R51.9

## 2017-06-26 HISTORY — DX: Unspecified glaucoma: H40.9

## 2017-06-26 HISTORY — PX: ESOPHAGOGASTRODUODENOSCOPY (EGD) WITH PROPOFOL: SHX5813

## 2017-06-26 SURGERY — ESOPHAGOGASTRODUODENOSCOPY (EGD) WITH PROPOFOL
Anesthesia: General

## 2017-06-26 MED ORDER — FENTANYL CITRATE (PF) 100 MCG/2ML IJ SOLN
INTRAMUSCULAR | Status: AC
  Filled 2017-06-26: qty 2

## 2017-06-26 MED ORDER — PHENYLEPHRINE HCL 10 MG/ML IJ SOLN
INTRAMUSCULAR | Status: DC | PRN
  Administered 2017-06-26: 100 ug via INTRAVENOUS

## 2017-06-26 MED ORDER — PROPOFOL 500 MG/50ML IV EMUL
INTRAVENOUS | Status: DC | PRN
  Administered 2017-06-26: 160 ug/kg/min via INTRAVENOUS

## 2017-06-26 MED ORDER — LIDOCAINE HCL (PF) 2 % IJ SOLN
INTRAMUSCULAR | Status: AC
  Filled 2017-06-26: qty 10

## 2017-06-26 MED ORDER — PROPOFOL 500 MG/50ML IV EMUL
INTRAVENOUS | Status: AC
  Filled 2017-06-26: qty 50

## 2017-06-26 MED ORDER — SODIUM CHLORIDE 0.9 % IV SOLN: INTRAVENOUS | Status: DC

## 2017-06-26 MED ORDER — PROPOFOL 10 MG/ML IV BOLUS
INTRAVENOUS | Status: DC | PRN
  Administered 2017-06-26: 100 mg via INTRAVENOUS

## 2017-06-26 MED ORDER — SODIUM CHLORIDE 0.9 % IV SOLN
INTRAVENOUS | Status: DC
  Administered 2017-06-26 (×2): via INTRAVENOUS

## 2017-06-26 MED ORDER — LIDOCAINE 2% (20 MG/ML) 5 ML SYRINGE
INTRAMUSCULAR | Status: DC | PRN
  Administered 2017-06-26: 40 mg via INTRAVENOUS

## 2017-06-26 MED ORDER — SODIUM CHLORIDE 0.9 % IV SOLN
INTRAVENOUS | Status: AC
  Administered 2017-06-26: 10:00:00
  Filled 2017-06-26: qty 1000

## 2017-06-26 MED ORDER — FENTANYL CITRATE (PF) 100 MCG/2ML IJ SOLN
INTRAMUSCULAR | Status: DC | PRN
  Administered 2017-06-26: 50 ug via INTRAVENOUS

## 2017-06-26 MED ORDER — SODIUM CHLORIDE 0.9 % IV SOLN: 1.0000 g | Freq: Once | INTRAVENOUS | Status: DC

## 2017-06-26 MED ORDER — PROPOFOL 10 MG/ML IV BOLUS
INTRAVENOUS | Status: AC
  Filled 2017-06-26: qty 20

## 2017-06-26 NOTE — Anesthesia Postprocedure Evaluation (Signed)
Anesthesia Post Note  Patient: Walker Shadow  Procedure(s) Performed: ESOPHAGOGASTRODUODENOSCOPY (EGD) WITH PROPOFOL (N/A )  Patient location during evaluation: Endoscopy Anesthesia Type: General Level of consciousness: awake and alert Pain management: pain level controlled Vital Signs Assessment: post-procedure vital signs reviewed and stable Respiratory status: spontaneous breathing, nonlabored ventilation, respiratory function stable and patient connected to nasal cannula oxygen Cardiovascular status: blood pressure returned to baseline and stable Postop Assessment: no apparent nausea or vomiting Anesthetic complications: no     Last Vitals:  Vitals:   06/26/17 1030 06/26/17 1045  BP: 116/72 130/69  Pulse: 83 69  Resp: 20 12  Temp:    SpO2: 100% 100%    Last Pain:  Vitals:   06/26/17 1030  TempSrc:   PainSc: 0-No pain                 Martha Clan

## 2017-06-26 NOTE — H&P (Signed)
Outpatient short stay form Pre-procedure 06/26/2017 9:32 AM Christine Sails MD  Primary Physician: Dr. Frazier Richards  Reason for visit:  EGD  History of present illness:  Patient is a 61 year old female presenting today as above. She has a personal history of NSAID use for chronic joint pain. She is currently taking meloxicam. She has a history of some left and/or right upper quadrant discomfort for about a year. She has been on various NSAIDs or during that time. She does take a single dose of proton pump inhibitor daily and has been placed on some Carafate. She does not get nauseated or have emesis with this problem.  She takes no blood thinning agents or aspirin products.    Current Facility-Administered Medications:  .  0.9 %  sodium chloride infusion, , Intravenous, Continuous, Christine Sails, MD .  0.9 %  sodium chloride infusion, , Intravenous, Continuous, Christine Sails, MD .  ampicillin (OMNIPEN) 1 g in sodium chloride 0.9 % 50 mL IVPB, 1 g, Intravenous, Once, Christine Sails, MD .  sodium chloride 0.9 % with ampicillin (OMNIPEN) ADS Med, , , ,   Prescriptions Prior to Admission  Medication Sig Dispense Refill Last Dose  . acetaminophen (TYLENOL) 500 MG tablet Take 500 mg by mouth 2 (two) times daily. 2 tablets   06/25/2017 at Unknown time  . atorvastatin (LIPITOR) 10 MG tablet Take 10 mg by mouth daily.   06/25/2017 at Unknown time  . clobetasol cream (TEMOVATE) 9.50 % Apply 1 application topically 2 (two) times daily.   06/26/2017 at Unknown time  . clonazePAM (KLONOPIN) 1 MG tablet Take 1 mg by mouth daily.   06/25/2017 at Unknown time  . clotrimazole-betamethasone (LOTRISONE) cream Apply 1 application topically 2 (two) times daily.   Past Month at Unknown time  . diphenhydrAMINE (BENADRYL) 25 MG tablet Take 25 mg by mouth every 6 (six) hours as needed.   06/25/2017 at Unknown time  . DULoxetine (CYMBALTA) 60 MG capsule Take 60 mg by mouth daily.   06/25/2017 at  Unknown time  . esomeprazole (NEXIUM) 40 MG capsule Take 40 mg by mouth 2 (two) times daily.   06/25/2017 at Unknown time  . gabapentin (NEURONTIN) 100 MG capsule Take 100 mg by mouth 3 (three) times daily as needed.   06/25/2017 at Unknown time  . Linaclotide (LINZESS) 290 MCG CAPS capsule Take 290 mcg by mouth daily.   06/25/2017 at Unknown time  . losartan (COZAAR) 50 MG tablet Take 50 mg by mouth daily.   06/26/2017 at Unknown time  . methimazole (TAPAZOLE) 10 MG tablet Take 10 mg by mouth daily. 3 tablets   06/25/2017 at Unknown time  . sucralfate (CARAFATE) 1 g tablet Take 1 g by mouth 4 (four) times daily.   06/25/2017 at Unknown time  . tiZANidine (ZANAFLEX) 4 MG capsule Take 4 mg by mouth 3 (three) times daily.   06/25/2017 at Unknown time  . traZODone (DESYREL) 150 MG tablet Take by mouth at bedtime.   06/25/2017 at Unknown time  . triamterene-hydrochlorothiazide (MAXZIDE) 75-50 MG tablet Take 1 tablet by mouth daily.   06/26/2017 at Unknown time  . Ascorbic Acid (VITAMIN C) 1000 MG tablet Take 1,000 mg by mouth every other day.   06/20/2017  . Calcium-Magnesium-Vitamin D (CALCIUM 1200+D3 PO) Take by mouth every other day.   06/20/2017  . meloxicam (MOBIC) 7.5 MG tablet Take 1 tablet (7.5 mg total) by mouth 2 (two) times daily after a meal. 42  tablet 2 06/20/2017  . Multiple Vitamin (MULTIVITAMIN) capsule Take 1 capsule by mouth every other day.   06/20/2017  . polyethylene glycol (MIRALAX / GLYCOLAX) packet Take 17 g by mouth as needed.   Taking  . solifenacin (VESICARE) 10 MG tablet Take 1 tablet (10 mg total) by mouth daily. 30 tablet 0   . vitamin B-12 (CYANOCOBALAMIN) 100 MCG tablet Take 100 mcg by mouth every other day.   06/20/2017  . vitamin E 100 UNIT capsule Take by mouth every other day.   06/20/2017     Allergies  Allergen Reactions  . Ibuprofen Nausea And Vomiting  . Shellfish Allergy Nausea And Vomiting  . Aspirin Nausea And Vomiting     Past Medical History:   Diagnosis Date  . Breast mass    LEFT x 3 months per pt and palpated by physician  . Cervical disc disease   . Chronic pain syndrome   . Degenerative disc disease, lumbar   . Fibromyalgia   . Fibromyalgia   . GERD (gastroesophageal reflux disease)   . Glaucoma   . Graves disease   . Hemorrhoids   . Hyperlipidemia   . Hypertension   . Thyroid disease     Review of systems:      Physical Exam    Heart and lungs: Regular rate and rhythm without rub or gallop, lungs are bilaterally clear.    HEENT: Normocephalic atraumatic eyes are anicteric    Other:     Pertinant exam for procedure: Soft nontender nondistended bowel sounds positive normoactive.    Planned proceedures: EGD and indicated procedures. I have discussed the risks benefits and complications of procedures to include not limited to bleeding, infection, perforation and the risk of sedation and the patient wishes to proceed.    Christine Sails, MD Gastroenterology 06/26/2017  9:32 AM

## 2017-06-26 NOTE — Anesthesia Preprocedure Evaluation (Signed)
Anesthesia Evaluation  Patient identified by MRN, date of birth, ID band Patient awake    Reviewed: Allergy & Precautions, H&P , NPO status , Patient's Chart, lab work & pertinent test results, reviewed documented beta blocker date and time   History of Anesthesia Complications Negative for: history of anesthetic complications  Airway Mallampati: I  TM Distance: >3 FB Neck ROM: full    Dental  (+) Edentulous Upper, Upper Dentures   Pulmonary neg pulmonary ROS, former smoker,    Pulmonary exam normal breath sounds clear to auscultation       Cardiovascular Exercise Tolerance: Good hypertension, (-) angina+ Peripheral Vascular Disease  (-) CAD, (-) Past MI, (-) Cardiac Stents and (-) CABG Normal cardiovascular exam(-) dysrhythmias (-) Valvular Problems/Murmurs Rhythm:regular Rate:Normal     Neuro/Psych neg Seizures PSYCHIATRIC DISORDERS  Neuromuscular disease    GI/Hepatic Neg liver ROS, GERD  ,  Endo/Other  neg diabetesHyperthyroidism   Renal/GU negative Renal ROS  negative genitourinary   Musculoskeletal   Abdominal   Peds  Hematology negative hematology ROS (+)   Anesthesia Other Findings Past Medical History: No date: Breast mass     Comment:  LEFT x 3 months per pt and palpated by physician No date: Cervical disc disease No date: Chronic pain syndrome No date: Degenerative disc disease, lumbar No date: Fibromyalgia No date: Fibromyalgia No date: GERD (gastroesophageal reflux disease) No date: Glaucoma No date: Graves disease No date: Hemorrhoids No date: Hyperlipidemia No date: Hypertension No date: Thyroid disease   Reproductive/Obstetrics negative OB ROS                             Anesthesia Physical Anesthesia Plan  ASA: II  Anesthesia Plan: General   Post-op Pain Management:    Induction: Intravenous  PONV Risk Score and Plan: 3 and Propofol infusion  Airway  Management Planned: Nasal Cannula  Additional Equipment:   Intra-op Plan:   Post-operative Plan:   Informed Consent: I have reviewed the patients History and Physical, chart, labs and discussed the procedure including the risks, benefits and alternatives for the proposed anesthesia with the patient or authorized representative who has indicated his/her understanding and acceptance.   Dental Advisory Given  Plan Discussed with: Anesthesiologist, CRNA and Surgeon  Anesthesia Plan Comments:         Anesthesia Quick Evaluation

## 2017-06-26 NOTE — Op Note (Signed)
[facility] Gastroenterology Patient Name: Christine Higgins Procedure Date: 06/26/2017 8:16 AM MRN: [id number] Account #: [id number] Date of Birth: 1956-08-07 Admit Type: Outpatient Age: 61 Room: [facility] ENDO ROOM 4 Gender: Female Note Status: Finalized Procedure:            Upper GI endoscopy Indications:          Abdominal pain in the right upper quadrant, Abdominal                        pain in the left upper quadrant Providers:            Lollie Sails, MD Referring MD:         Ocie Cornfield. Ouida Sills MD, MD (Referring MD) Medicines:            Monitored Anesthesia Care Complications:        No immediate complications. Procedure:            Pre-Anesthesia Assessment:                       - ASA Grade Assessment: II - A patient with mild                        systemic disease.                       After obtaining informed consent, the endoscope was                        passed under direct vision. Throughout the procedure,                        the patient's blood pressure, pulse, and oxygen                        saturations were monitored continuously. The Endoscope                        was introduced through the mouth, and advanced to the                        third part of duodenum. The upper GI endoscopy was                        accomplished without difficulty. The patient tolerated                        the procedure well. Findings:      The Z-line was variable. Biopsies were taken with a cold forceps for       histology.      Patchy minimal inflammation characterized by erythema was found in the       gastric body. Biopsies were taken with a cold forceps for histology.       Biopsies were taken with a cold forceps for Helicobacter pylori testing.      The cardia and gastric fundus were normal on retroflexion.      Patchy minimal erythematous mucosa without active bleeding and with no       stigmata of bleeding was found in the duodenal  bulb. Impression:           - Z-line variable.  Biopsied.                       - Gastritis. Biopsied.                       - Erythematous duodenopathy. Recommendation:       - Await pathology results.                       - Use Protonix (pantoprazole) 40 mg PO BID for 1 month.                       - Use Protonix (pantoprazole) 40 mg PO daily daily.                       - Return to GI clinic in 4 weeks. Procedure Code(s):    --- Professional ---                       [phone], Esophagogastroduodenoscopy, flexible, transoral;                        with biopsy, single or multiple Diagnosis Code(s):    --- Professional ---                       K22.8, Other specified diseases of esophagus                       K29.70, Gastritis, unspecified, without bleeding                       K31.89, Other diseases of stomach and duodenum                       R10.11, Right upper quadrant pain                       R10.12, Left upper quadrant pain CPT copyright 2016 American Medical Association. All rights reserved. The codes documented in this report are preliminary and upon coder review may  be revised to meet current compliance requirements. Lollie Sails, MD 06/26/2017 10:10:51 AM This report has been signed electronically. Number of Addenda: 0 Note Initiated On: 06/26/2017 8:16 AM      [facility]

## 2017-06-26 NOTE — Transfer of Care (Signed)
Immediate Anesthesia Transfer of Care Note  Patient: Christine Higgins  Procedure(s) Performed: ESOPHAGOGASTRODUODENOSCOPY (EGD) WITH PROPOFOL (N/A )  Patient Location: PACU and Endoscopy Unit  Anesthesia Type:General  Level of Consciousness: awake, drowsy and patient cooperative  Airway & Oxygen Therapy: Patient Spontanous Breathing and Patient connected to nasal cannula oxygen  Post-op Assessment: Report given to RN and Post -op Vital signs reviewed and stable  Post vital signs: Reviewed and stable  Last Vitals:  Vitals:   06/26/17 0942  BP: 116/78  Pulse: 78  Resp: 20  Temp: (!) 36.2 C  SpO2: 98%    Last Pain:  Vitals:   06/26/17 0942  TempSrc: Tympanic         Complications: No apparent anesthesia complications

## 2017-06-26 NOTE — Anesthesia Post-op Follow-up Note (Signed)
Anesthesia QCDR form completed.        

## 2017-06-27 ENCOUNTER — Encounter: Payer: Self-pay | Admitting: Gastroenterology

## 2017-06-27 LAB — SURGICAL PATHOLOGY

## 2017-06-30 DIAGNOSIS — H25813 Combined forms of age-related cataract, bilateral: Secondary | ICD-10-CM | POA: Diagnosis not present

## 2017-06-30 DIAGNOSIS — E0500 Thyrotoxicosis with diffuse goiter without thyrotoxic crisis or storm: Secondary | ICD-10-CM | POA: Diagnosis not present

## 2017-07-01 DIAGNOSIS — M9902 Segmental and somatic dysfunction of thoracic region: Secondary | ICD-10-CM | POA: Diagnosis not present

## 2017-07-01 DIAGNOSIS — M5136 Other intervertebral disc degeneration, lumbar region: Secondary | ICD-10-CM | POA: Diagnosis not present

## 2017-07-01 DIAGNOSIS — R32 Unspecified urinary incontinence: Secondary | ICD-10-CM | POA: Diagnosis not present

## 2017-07-01 DIAGNOSIS — M5134 Other intervertebral disc degeneration, thoracic region: Secondary | ICD-10-CM | POA: Diagnosis not present

## 2017-07-03 ENCOUNTER — Other Ambulatory Visit: Payer: Self-pay | Admitting: General Surgery

## 2017-07-03 DIAGNOSIS — R2232 Localized swelling, mass and lump, left upper limb: Secondary | ICD-10-CM | POA: Diagnosis not present

## 2017-07-04 DIAGNOSIS — M5136 Other intervertebral disc degeneration, lumbar region: Secondary | ICD-10-CM | POA: Diagnosis not present

## 2017-07-04 DIAGNOSIS — M9902 Segmental and somatic dysfunction of thoracic region: Secondary | ICD-10-CM | POA: Diagnosis not present

## 2017-07-04 DIAGNOSIS — M5134 Other intervertebral disc degeneration, thoracic region: Secondary | ICD-10-CM | POA: Diagnosis not present

## 2017-07-09 ENCOUNTER — Ambulatory Visit: Payer: Medicare HMO

## 2017-07-09 DIAGNOSIS — M9983 Other biomechanical lesions of lumbar region: Secondary | ICD-10-CM | POA: Diagnosis not present

## 2017-07-09 DIAGNOSIS — M48062 Spinal stenosis, lumbar region with neurogenic claudication: Secondary | ICD-10-CM | POA: Diagnosis not present

## 2017-07-10 DIAGNOSIS — K210 Gastro-esophageal reflux disease with esophagitis: Secondary | ICD-10-CM | POA: Diagnosis not present

## 2017-07-10 DIAGNOSIS — M5136 Other intervertebral disc degeneration, lumbar region: Secondary | ICD-10-CM | POA: Diagnosis not present

## 2017-07-10 DIAGNOSIS — K581 Irritable bowel syndrome with constipation: Secondary | ICD-10-CM | POA: Diagnosis not present

## 2017-07-10 DIAGNOSIS — M5134 Other intervertebral disc degeneration, thoracic region: Secondary | ICD-10-CM | POA: Diagnosis not present

## 2017-07-10 DIAGNOSIS — M9902 Segmental and somatic dysfunction of thoracic region: Secondary | ICD-10-CM | POA: Diagnosis not present

## 2017-07-18 ENCOUNTER — Ambulatory Visit
Admission: RE | Admit: 2017-07-18 | Discharge: 2017-07-18 | Disposition: A | Discharge status: Home / Self Care | Payer: Medicare HMO | Source: Ambulatory Visit | Attending: General Surgery | Admitting: General Surgery

## 2017-07-18 DIAGNOSIS — R2232 Localized swelling, mass and lump, left upper limb: Secondary | ICD-10-CM | POA: Diagnosis not present

## 2017-07-22 DIAGNOSIS — M9902 Segmental and somatic dysfunction of thoracic region: Secondary | ICD-10-CM | POA: Diagnosis not present

## 2017-07-22 DIAGNOSIS — M5136 Other intervertebral disc degeneration, lumbar region: Secondary | ICD-10-CM | POA: Diagnosis not present

## 2017-07-22 DIAGNOSIS — M5134 Other intervertebral disc degeneration, thoracic region: Secondary | ICD-10-CM | POA: Diagnosis not present

## 2017-07-23 ENCOUNTER — Encounter: Payer: Self-pay | Admitting: Urology

## 2017-07-23 ENCOUNTER — Other Ambulatory Visit: Payer: Self-pay | Admitting: Urology

## 2017-07-25 DIAGNOSIS — M9902 Segmental and somatic dysfunction of thoracic region: Secondary | ICD-10-CM | POA: Diagnosis not present

## 2017-07-25 DIAGNOSIS — M5136 Other intervertebral disc degeneration, lumbar region: Secondary | ICD-10-CM | POA: Diagnosis not present

## 2017-07-25 DIAGNOSIS — M5134 Other intervertebral disc degeneration, thoracic region: Secondary | ICD-10-CM | POA: Diagnosis not present

## 2017-07-29 DIAGNOSIS — I10 Essential (primary) hypertension: Secondary | ICD-10-CM | POA: Diagnosis not present

## 2017-07-29 DIAGNOSIS — M5134 Other intervertebral disc degeneration, thoracic region: Secondary | ICD-10-CM | POA: Diagnosis not present

## 2017-07-29 DIAGNOSIS — M9902 Segmental and somatic dysfunction of thoracic region: Secondary | ICD-10-CM | POA: Diagnosis not present

## 2017-07-29 DIAGNOSIS — B0229 Other postherpetic nervous system involvement: Secondary | ICD-10-CM | POA: Diagnosis not present

## 2017-07-29 DIAGNOSIS — B354 Tinea corporis: Secondary | ICD-10-CM | POA: Diagnosis not present

## 2017-07-29 DIAGNOSIS — E119 Type 2 diabetes mellitus without complications: Secondary | ICD-10-CM | POA: Diagnosis not present

## 2017-07-29 DIAGNOSIS — M5136 Other intervertebral disc degeneration, lumbar region: Secondary | ICD-10-CM | POA: Diagnosis not present

## 2017-07-30 ENCOUNTER — Inpatient Hospital Stay: Admission: RE | Admit: 2017-07-30 | Payer: Self-pay | Source: Ambulatory Visit

## 2017-07-30 DIAGNOSIS — M797 Fibromyalgia: Secondary | ICD-10-CM | POA: Insufficient documentation

## 2017-07-30 DIAGNOSIS — Z96641 Presence of right artificial hip joint: Secondary | ICD-10-CM | POA: Diagnosis not present

## 2017-07-30 DIAGNOSIS — G894 Chronic pain syndrome: Secondary | ICD-10-CM | POA: Insufficient documentation

## 2017-07-30 DIAGNOSIS — Z6835 Body mass index (BMI) 35.0-35.9, adult: Secondary | ICD-10-CM | POA: Diagnosis not present

## 2017-07-30 DIAGNOSIS — E0500 Thyrotoxicosis with diffuse goiter without thyrotoxic crisis or storm: Secondary | ICD-10-CM | POA: Diagnosis not present

## 2017-07-30 DIAGNOSIS — M1712 Unilateral primary osteoarthritis, left knee: Secondary | ICD-10-CM | POA: Diagnosis not present

## 2017-07-30 DIAGNOSIS — F325 Major depressive disorder, single episode, in full remission: Secondary | ICD-10-CM | POA: Diagnosis not present

## 2017-07-30 DIAGNOSIS — M1711 Unilateral primary osteoarthritis, right knee: Secondary | ICD-10-CM | POA: Diagnosis not present

## 2017-07-31 DIAGNOSIS — R32 Unspecified urinary incontinence: Secondary | ICD-10-CM | POA: Diagnosis not present

## 2017-08-01 ENCOUNTER — Other Ambulatory Visit: Payer: Self-pay

## 2017-08-01 ENCOUNTER — Encounter
Admission: RE | Admit: 2017-08-01 | Discharge: 2017-08-01 | Disposition: A | Discharge status: Home / Self Care | Payer: Medicare HMO | Source: Ambulatory Visit | Attending: Orthopedic Surgery | Admitting: Orthopedic Surgery

## 2017-08-01 DIAGNOSIS — M5134 Other intervertebral disc degeneration, thoracic region: Secondary | ICD-10-CM | POA: Diagnosis not present

## 2017-08-01 DIAGNOSIS — M9902 Segmental and somatic dysfunction of thoracic region: Secondary | ICD-10-CM | POA: Diagnosis not present

## 2017-08-01 DIAGNOSIS — Z01818 Encounter for other preprocedural examination: Secondary | ICD-10-CM | POA: Diagnosis not present

## 2017-08-01 DIAGNOSIS — M5136 Other intervertebral disc degeneration, lumbar region: Secondary | ICD-10-CM | POA: Diagnosis not present

## 2017-08-01 LAB — SURGICAL PCR SCREEN
MRSA, PCR: NEGATIVE
Staphylococcus aureus: NEGATIVE

## 2017-08-01 LAB — CBC
HCT: 38 % (ref 35.0–47.0)
Hemoglobin: 12.7 g/dL (ref 12.0–16.0)
MCH: 27.7 pg (ref 26.0–34.0)
MCHC: 33.5 g/dL (ref 32.0–36.0)
MCV: 82.8 fL (ref 80.0–100.0)
Platelets: 176 10*3/uL (ref 150–440)
RBC: 4.59 MIL/uL (ref 3.80–5.20)
RDW: 14.3 % (ref 11.5–14.5)
WBC: 2.8 10*3/uL — ABNORMAL LOW (ref 3.6–11.0)

## 2017-08-01 LAB — COMPREHENSIVE METABOLIC PANEL
ALT: 24 U/L (ref 14–54)
AST: 21 U/L (ref 15–41)
Albumin: 4 g/dL (ref 3.5–5.0)
Alkaline Phosphatase: 82 U/L (ref 38–126)
Anion gap: 8 (ref 5–15)
BUN: 6 mg/dL (ref 6–20)
CO2: 28 mmol/L (ref 22–32)
Calcium: 9.5 mg/dL (ref 8.9–10.3)
Chloride: 101 mmol/L (ref 101–111)
Creatinine, Ser: 0.62 mg/dL (ref 0.44–1.00)
GFR calc Af Amer: >60 mL/min (ref >60)
GFR calc non Af Amer: >60 mL/min (ref >60)
Glucose, Bld: 103 mg/dL — ABNORMAL HIGH (ref 65–99)
Potassium: 3.8 mmol/L (ref 3.5–5.1)
Sodium: 137 mmol/L (ref 135–145)
Total Bilirubin: 0.6 mg/dL (ref 0.3–1.2)
Total Protein: 7 g/dL (ref 6.5–8.1)

## 2017-08-01 LAB — URINALYSIS, ROUTINE W REFLEX MICROSCOPIC
Bilirubin Urine: NEGATIVE
Glucose, UA: NEGATIVE mg/dL
Hgb urine dipstick: NEGATIVE
Ketones, ur: NEGATIVE mg/dL
Leukocytes, UA: NEGATIVE
Nitrite: NEGATIVE
Protein, ur: NEGATIVE mg/dL
Specific Gravity, Urine: 1.002 — ABNORMAL LOW (ref 1.005–1.030)
pH: 6 (ref 5.0–8.0)

## 2017-08-01 LAB — SEDIMENTATION RATE: Sed Rate: 11 mm/hr (ref 0–30)

## 2017-08-01 LAB — APTT: aPTT: 29 s (ref 24–36)

## 2017-08-01 LAB — C-REACTIVE PROTEIN: CRP: <0.8 mg/dL

## 2017-08-01 LAB — PROTIME-INR
INR: 0.94
Prothrombin Time: 12.5 seconds (ref 11.4–15.2)

## 2017-08-01 NOTE — Patient Instructions (Signed)
Your procedure is scheduled on: Monday 08/11/17 Report to [place]. 2ND FLOOR MEDICAL MALL ENTRANCE. To find out your arrival time please call [phone] between 1PM - 3PM on Friday 08/08/17.  Remember: Instructions that are not followed completely may result in serious medical risk, up to and including death, or upon the discretion of your surgeon and anesthesiologist your surgery may need to be rescheduled.    __X__ 1. Do not eat anything after midnight the night before your    procedure.  No gum chewing or hard candies.  You may drink clear   liquids up to 2 hours before you are scheduled to arrive at the   hospital for your procedure. Do not drink clear liquids within 2   hours of scheduled arrival to the hospital as this may lead to your   procedure being delayed or rescheduled.       Clear liquids include:   Water or Apple juice without pulp   Clear carbohydrate beverage such as Clearfast or Gatorade   Black coffee or Clear Tea (no milk, no creamer, do not add anything   to the coffee or tea)    Diabetics should only drink water   __X__ 2. No Alcohol for 24 hours before or after surgery.   ____ 3. Bring all medications with you on the day of surgery if instructed.    __X__ 4. Notify your doctor if there is any change in your medical condition     (cold, fever, infections).             __X___5. No smoking within 24 hours of your surgery.     Do not wear jewelry, make-up, hairpins, clips or nail polish.  Do not wear lotions, powders, or perfumes.   Do not shave 48 hours prior to surgery. Men may shave face and neck.  Do not bring valuables to the hospital.    [facility] is not responsible for any belongings or valuables.               Contacts, dentures or bridgework may not be worn into surgery.  Leave your suitcase in the car. After surgery it may be brought to your room.  For patients admitted to the hospital, discharge time is determined by your                 treatment team.   Patients discharged the day of surgery will not be allowed to drive home.   Please read over the following fact sheets that you were given:   MRSA Information   __X__ Take these medicines the morning of surgery with A SIP OF WATER:    1. CYMBALTA  2. GABAPENTIN  3. OMEPRAZOLE  4.   5.  6.  ____ Fleet Enema (as directed)   ____ Use CHG Soap/SAGE wipes as directe  ____ Use inhalers on the day of surgery  ____ Stop metformin 2 days prior to surgery    ____ Take 1/2 of usual insulin dose the night before surgery and none on the morning of surgery.   ____ Stop Coumadin/Plavix/aspirin on   __X__ Stop Anti-inflammatories such as Advil, Aleve, Ibuprofen, Motrin, Naproxen, Naprosyn, Goodies,powder, or aspirin products.  OK to take Tylenol.   __X__ Stop supplements, Vitamin E, Fish Oil until after surgery.    ____ Bring C-Pap to the hospital.

## 2017-08-03 LAB — URINE CULTURE
Culture: NO GROWTH
Special Requests: NORMAL

## 2017-08-03 LAB — TYPE AND SCREEN
ABO/RH(D): B POS
Antibody Screen: NEGATIVE

## 2017-08-07 DIAGNOSIS — M1712 Unilateral primary osteoarthritis, left knee: Secondary | ICD-10-CM | POA: Diagnosis not present

## 2017-08-10 MED ORDER — CEFAZOLIN SODIUM-DEXTROSE 2-4 GM/100ML-% IV SOLN
2.0000 g | INTRAVENOUS | Status: AC
  Administered 2017-08-11: 2 g via INTRAVENOUS

## 2017-08-10 MED ORDER — TRANEXAMIC ACID 1000 MG/10ML IV SOLN
1000.0000 mg | INTRAVENOUS | Status: AC
  Administered 2017-08-11: 1000 mg via INTRAVENOUS
  Filled 2017-08-10: qty 10

## 2017-08-11 ENCOUNTER — Other Ambulatory Visit: Payer: Self-pay

## 2017-08-11 ENCOUNTER — Inpatient Hospital Stay: Payer: Medicare HMO | Admitting: Anesthesiology

## 2017-08-11 ENCOUNTER — Encounter
Admission: RE | Disposition: A | Discharge status: Skilled Nursing Facility | Payer: Self-pay | Source: Ambulatory Visit | Attending: Orthopedic Surgery

## 2017-08-11 ENCOUNTER — Inpatient Hospital Stay: Payer: Medicare HMO

## 2017-08-11 ENCOUNTER — Encounter: Payer: Self-pay | Admitting: Orthopedic Surgery

## 2017-08-11 ENCOUNTER — Inpatient Hospital Stay
Admission: RE | Admit: 2017-08-11 | Discharge: 2017-08-13 | DRG: 470 | Disposition: A | Discharge status: Skilled Nursing Facility | Payer: Medicare HMO | Source: Ambulatory Visit | Attending: Orthopedic Surgery | Admitting: Orthopedic Surgery

## 2017-08-11 DIAGNOSIS — E0500 Thyrotoxicosis with diffuse goiter without thyrotoxic crisis or storm: Secondary | ICD-10-CM | POA: Diagnosis present

## 2017-08-11 DIAGNOSIS — M25562 Pain in left knee: Secondary | ICD-10-CM | POA: Diagnosis present

## 2017-08-11 DIAGNOSIS — E119 Type 2 diabetes mellitus without complications: Secondary | ICD-10-CM | POA: Diagnosis present

## 2017-08-11 DIAGNOSIS — Z7401 Bed confinement status: Secondary | ICD-10-CM | POA: Diagnosis not present

## 2017-08-11 DIAGNOSIS — K219 Gastro-esophageal reflux disease without esophagitis: Secondary | ICD-10-CM | POA: Diagnosis present

## 2017-08-11 DIAGNOSIS — I10 Essential (primary) hypertension: Secondary | ICD-10-CM | POA: Diagnosis present

## 2017-08-11 DIAGNOSIS — M5090 Cervical disc disorder, unspecified, unspecified cervical region: Secondary | ICD-10-CM | POA: Diagnosis present

## 2017-08-11 DIAGNOSIS — Z79899 Other long term (current) drug therapy: Secondary | ICD-10-CM

## 2017-08-11 DIAGNOSIS — M1712 Unilateral primary osteoarthritis, left knee: Secondary | ICD-10-CM | POA: Diagnosis not present

## 2017-08-11 DIAGNOSIS — M810 Age-related osteoporosis without current pathological fracture: Secondary | ICD-10-CM | POA: Diagnosis present

## 2017-08-11 DIAGNOSIS — Z6837 Body mass index (BMI) 37.0-37.9, adult: Secondary | ICD-10-CM | POA: Diagnosis not present

## 2017-08-11 DIAGNOSIS — F325 Major depressive disorder, single episode, in full remission: Secondary | ICD-10-CM | POA: Diagnosis not present

## 2017-08-11 DIAGNOSIS — G894 Chronic pain syndrome: Secondary | ICD-10-CM | POA: Diagnosis present

## 2017-08-11 DIAGNOSIS — Z96659 Presence of unspecified artificial knee joint: Secondary | ICD-10-CM

## 2017-08-11 DIAGNOSIS — E785 Hyperlipidemia, unspecified: Secondary | ICD-10-CM | POA: Diagnosis present

## 2017-08-11 DIAGNOSIS — M5136 Other intervertebral disc degeneration, lumbar region: Secondary | ICD-10-CM | POA: Diagnosis present

## 2017-08-11 DIAGNOSIS — R262 Difficulty in walking, not elsewhere classified: Secondary | ICD-10-CM | POA: Diagnosis not present

## 2017-08-11 DIAGNOSIS — M6281 Muscle weakness (generalized): Secondary | ICD-10-CM | POA: Diagnosis not present

## 2017-08-11 DIAGNOSIS — Z96652 Presence of left artificial knee joint: Secondary | ICD-10-CM

## 2017-08-11 DIAGNOSIS — M797 Fibromyalgia: Secondary | ICD-10-CM | POA: Diagnosis present

## 2017-08-11 DIAGNOSIS — E669 Obesity, unspecified: Secondary | ICD-10-CM | POA: Diagnosis present

## 2017-08-11 DIAGNOSIS — M5137 Other intervertebral disc degeneration, lumbosacral region: Secondary | ICD-10-CM | POA: Diagnosis not present

## 2017-08-11 DIAGNOSIS — H409 Unspecified glaucoma: Secondary | ICD-10-CM | POA: Diagnosis present

## 2017-08-11 DIAGNOSIS — Z471 Aftercare following joint replacement surgery: Secondary | ICD-10-CM | POA: Diagnosis not present

## 2017-08-11 DIAGNOSIS — G8929 Other chronic pain: Secondary | ICD-10-CM | POA: Diagnosis not present

## 2017-08-11 HISTORY — PX: KNEE ARTHROPLASTY: SHX992

## 2017-08-11 LAB — ABO/RH: ABO/RH(D): B POS

## 2017-08-11 SURGERY — ARTHROPLASTY, KNEE, TOTAL, USING IMAGELESS COMPUTER-ASSISTED NAVIGATION
Anesthesia: Spinal | Laterality: Left | Wound class: Clean

## 2017-08-11 MED ORDER — ENOXAPARIN SODIUM 30 MG/0.3ML ~~LOC~~ SOLN
30.0000 mg | Freq: Two times a day (BID) | SUBCUTANEOUS | Status: DC
  Administered 2017-08-12 – 2017-08-13 (×3): 30 mg via SUBCUTANEOUS
  Filled 2017-08-11 (×3): qty 0.3

## 2017-08-11 MED ORDER — NEOMYCIN-POLYMYXIN B GU 40-200000 IR SOLN
Status: DC | PRN
  Administered 2017-08-11: 12 mL

## 2017-08-11 MED ORDER — TIZANIDINE HCL 4 MG PO TABS
4.0000 mg | ORAL_TABLET | Freq: Three times a day (TID) | ORAL | Status: DC | PRN
  Administered 2017-08-11 – 2017-08-12 (×2): 4 mg via ORAL
  Filled 2017-08-11 (×3): qty 1

## 2017-08-11 MED ORDER — VITAMIN D 1000 UNITS PO TABS
1000.0000 [IU] | ORAL_TABLET | Freq: Every evening | ORAL | Status: DC
  Administered 2017-08-12: 1000 [IU] via ORAL
  Filled 2017-08-11: qty 1

## 2017-08-11 MED ORDER — VITAMIN C 500 MG PO TABS
1000.0000 mg | ORAL_TABLET | Freq: Every evening | ORAL | Status: DC
  Administered 2017-08-12: 1000 mg via ORAL
  Filled 2017-08-11 (×3): qty 2

## 2017-08-11 MED ORDER — OXYCODONE HCL 5 MG PO TABS: 5.0000 mg | ORAL_TABLET | Freq: Once | ORAL | Status: DC | PRN

## 2017-08-11 MED ORDER — PROPOFOL 500 MG/50ML IV EMUL
INTRAVENOUS | Status: DC | PRN
  Administered 2017-08-11: 40 ug/kg/min via INTRAVENOUS

## 2017-08-11 MED ORDER — TRAZODONE HCL 50 MG PO TABS
150.0000 mg | ORAL_TABLET | Freq: Every day | ORAL | Status: DC
  Administered 2017-08-11 – 2017-08-12 (×2): 150 mg via ORAL
  Filled 2017-08-11 (×2): qty 1

## 2017-08-11 MED ORDER — PROPOFOL 500 MG/50ML IV EMUL
INTRAVENOUS | Status: AC
  Filled 2017-08-11: qty 50

## 2017-08-11 MED ORDER — SENNOSIDES-DOCUSATE SODIUM 8.6-50 MG PO TABS
1.0000 | ORAL_TABLET | Freq: Two times a day (BID) | ORAL | Status: DC
  Administered 2017-08-11 – 2017-08-13 (×4): 1 via ORAL
  Filled 2017-08-11 (×4): qty 1

## 2017-08-11 MED ORDER — MENTHOL 3 MG MT LOZG
1.0000 | LOZENGE | OROMUCOSAL | Status: DC | PRN
  Filled 2017-08-11: qty 9

## 2017-08-11 MED ORDER — LINACLOTIDE 290 MCG PO CAPS
290.0000 ug | ORAL_CAPSULE | Freq: Every day | ORAL | Status: DC
  Administered 2017-08-12 – 2017-08-13 (×2): 290 ug via ORAL
  Filled 2017-08-11 (×3): qty 1

## 2017-08-11 MED ORDER — ONDANSETRON HCL 4 MG/2ML IJ SOLN: 4.0000 mg | Freq: Four times a day (QID) | INTRAMUSCULAR | Status: DC | PRN

## 2017-08-11 MED ORDER — ACETAMINOPHEN 325 MG PO TABS
650.0000 mg | ORAL_TABLET | ORAL | Status: DC | PRN
  Administered 2017-08-11 – 2017-08-13 (×2): 650 mg via ORAL
  Filled 2017-08-11 (×2): qty 2

## 2017-08-11 MED ORDER — SALINE SPRAY 0.65 % NA SOLN
1.0000 | Freq: Every day | NASAL | Status: DC | PRN
  Filled 2017-08-11: qty 44

## 2017-08-11 MED ORDER — MAGNESIUM HYDROXIDE 400 MG/5ML PO SUSP
30.0000 mL | Freq: Every day | ORAL | Status: DC | PRN
  Administered 2017-08-12: 30 mL via ORAL
  Filled 2017-08-11: qty 30

## 2017-08-11 MED ORDER — PANTOPRAZOLE SODIUM 40 MG PO TBEC
40.0000 mg | DELAYED_RELEASE_TABLET | Freq: Two times a day (BID) | ORAL | Status: DC
  Administered 2017-08-11 – 2017-08-13 (×4): 40 mg via ORAL
  Filled 2017-08-11 (×4): qty 1

## 2017-08-11 MED ORDER — HYDROCHLOROTHIAZIDE 25 MG PO TABS
25.0000 mg | ORAL_TABLET | Freq: Every day | ORAL | Status: DC
  Administered 2017-08-12 – 2017-08-13 (×2): 25 mg via ORAL
  Filled 2017-08-11 (×2): qty 1

## 2017-08-11 MED ORDER — VITAMIN E 180 MG (400 UNIT) PO CAPS
400.0000 [IU] | ORAL_CAPSULE | Freq: Every day | ORAL | Status: DC
  Administered 2017-08-12 – 2017-08-13 (×2): 400 [IU] via ORAL
  Filled 2017-08-11 (×3): qty 1

## 2017-08-11 MED ORDER — SODIUM CHLORIDE 0.9 % IV SOLN
INTRAVENOUS | Status: DC
  Administered 2017-08-11: 100 mL via INTRAVENOUS

## 2017-08-11 MED ORDER — FERROUS SULFATE 325 (65 FE) MG PO TABS
325.0000 mg | ORAL_TABLET | Freq: Two times a day (BID) | ORAL | Status: DC
  Administered 2017-08-12 – 2017-08-13 (×3): 325 mg via ORAL
  Filled 2017-08-11 (×3): qty 1

## 2017-08-11 MED ORDER — CICLOPIROX 8 % EX SOLN
1.0000 "application " | Freq: Two times a day (BID) | CUTANEOUS | Status: DC | PRN
  Filled 2017-08-11: qty 6.6

## 2017-08-11 MED ORDER — PHENOL 1.4 % MT LIQD
1.0000 | OROMUCOSAL | Status: DC | PRN
  Filled 2017-08-11: qty 177

## 2017-08-11 MED ORDER — CLONAZEPAM 0.5 MG PO TABS
1.0000 mg | ORAL_TABLET | Freq: Every evening | ORAL | Status: DC | PRN
  Administered 2017-08-12: 1 mg via ORAL
  Filled 2017-08-11: qty 2

## 2017-08-11 MED ORDER — PROMETHAZINE HCL 25 MG/ML IJ SOLN: 6.2500 mg | INTRAMUSCULAR | Status: DC | PRN

## 2017-08-11 MED ORDER — DARIFENACIN HYDROBROMIDE ER 7.5 MG PO TB24
15.0000 mg | ORAL_TABLET | Freq: Every day | ORAL | Status: DC
  Administered 2017-08-11 – 2017-08-13 (×3): 15 mg via ORAL
  Filled 2017-08-11: qty 2
  Filled 2017-08-11: qty 1
  Filled 2017-08-11: qty 2

## 2017-08-11 MED ORDER — MIDAZOLAM HCL 5 MG/5ML IJ SOLN
INTRAMUSCULAR | Status: DC | PRN
  Administered 2017-08-11: 0.5 mg via INTRAVENOUS
  Administered 2017-08-11: 1.5 mg via INTRAVENOUS

## 2017-08-11 MED ORDER — ADULT MULTIVITAMIN W/MINERALS CH
1.0000 | ORAL_TABLET | Freq: Every day | ORAL | Status: DC
  Administered 2017-08-12 – 2017-08-13 (×2): 1 via ORAL
  Filled 2017-08-11 (×2): qty 1

## 2017-08-11 MED ORDER — ACETAMINOPHEN 10 MG/ML IV SOLN
1000.0000 mg | Freq: Four times a day (QID) | INTRAVENOUS | Status: AC
  Administered 2017-08-11 – 2017-08-12 (×4): 1000 mg via INTRAVENOUS
  Filled 2017-08-11 (×4): qty 100

## 2017-08-11 MED ORDER — CLOTRIMAZOLE 1 % EX CREA
TOPICAL_CREAM | Freq: Two times a day (BID) | CUTANEOUS | Status: DC | PRN
  Filled 2017-08-11: qty 15

## 2017-08-11 MED ORDER — GLYCOPYRROLATE 0.2 MG/ML IJ SOLN
INTRAMUSCULAR | Status: DC | PRN
  Administered 2017-08-11: 0.2 mg via INTRAVENOUS

## 2017-08-11 MED ORDER — NAPHAZOLINE-GLYCERIN 0.012-0.25 % OP SOLN
1.0000 [drp] | Freq: Four times a day (QID) | OPHTHALMIC | Status: DC | PRN
Start: 2017-08-11 — End: 2017-08-13
  Filled 2017-08-11: qty 30

## 2017-08-11 MED ORDER — BUPIVACAINE HCL (PF) 0.5 % IJ SOLN
INTRAMUSCULAR | Status: DC | PRN
  Administered 2017-08-11: 2.5 mL

## 2017-08-11 MED ORDER — CEFAZOLIN SODIUM-DEXTROSE 2-4 GM/100ML-% IV SOLN
2.0000 g | Freq: Four times a day (QID) | INTRAVENOUS | Status: DC
Start: 2017-08-11 — End: 2017-08-11

## 2017-08-11 MED ORDER — PROPOFOL 10 MG/ML IV BOLUS
INTRAVENOUS | Status: AC
  Filled 2017-08-11: qty 20

## 2017-08-11 MED ORDER — DIPHENHYDRAMINE HCL 12.5 MG/5ML PO ELIX: 12.5000 mg | ORAL_SOLUTION | ORAL | Status: DC | PRN

## 2017-08-11 MED ORDER — GLYCOPYRROLATE 0.2 MG/ML IJ SOLN
INTRAMUSCULAR | Status: AC
  Filled 2017-08-11: qty 1

## 2017-08-11 MED ORDER — POLYVINYL ALCOHOL 1.4 % OP SOLN
1.0000 [drp] | Freq: Two times a day (BID) | OPHTHALMIC | Status: DC
  Administered 2017-08-12 (×3): 1 [drp] via OPHTHALMIC
  Filled 2017-08-11: qty 15

## 2017-08-11 MED ORDER — FENTANYL CITRATE (PF) 100 MCG/2ML IJ SOLN
INTRAMUSCULAR | Status: AC
  Administered 2017-08-11: 25 ug via INTRAVENOUS
  Filled 2017-08-11: qty 2

## 2017-08-11 MED ORDER — GABAPENTIN 300 MG PO CAPS
300.0000 mg | ORAL_CAPSULE | Freq: Three times a day (TID) | ORAL | Status: DC
  Administered 2017-08-11 – 2017-08-13 (×6): 300 mg via ORAL
  Filled 2017-08-11 (×6): qty 1

## 2017-08-11 MED ORDER — CALCIUM CARBONATE ANTACID 500 MG PO CHEW
2.5000 | CHEWABLE_TABLET | Freq: Every evening | ORAL | Status: DC
  Administered 2017-08-12: 500 mg via ORAL
  Filled 2017-08-11: qty 3

## 2017-08-11 MED ORDER — CEFAZOLIN SODIUM-DEXTROSE 2-4 GM/100ML-% IV SOLN
INTRAVENOUS | Status: AC
  Filled 2017-08-11: qty 100

## 2017-08-11 MED ORDER — VITAMIN B-12 1000 MCG PO TABS
1000.0000 ug | ORAL_TABLET | Freq: Every evening | ORAL | Status: DC
  Administered 2017-08-12: 1000 ug via ORAL
  Filled 2017-08-11: qty 1

## 2017-08-11 MED ORDER — CLOBETASOL PROPIONATE 0.05 % EX SOLN
1.0000 "application " | Freq: Two times a day (BID) | CUTANEOUS | Status: DC | PRN
  Filled 2017-08-11: qty 50

## 2017-08-11 MED ORDER — FLEET ENEMA 7-19 GM/118ML RE ENEM: 1.0000 | ENEMA | Freq: Once | RECTAL | Status: DC | PRN

## 2017-08-11 MED ORDER — ALUM & MAG HYDROXIDE-SIMETH 200-200-20 MG/5ML PO SUSP: 30.0000 mL | ORAL | Status: DC | PRN

## 2017-08-11 MED ORDER — SODIUM CHLORIDE 0.9 % IV SOLN
INTRAVENOUS | Status: DC | PRN
  Administered 2017-08-11: 60 mL

## 2017-08-11 MED ORDER — BUPIVACAINE HCL (PF) 0.25 % IJ SOLN
INTRAMUSCULAR | Status: DC | PRN
  Administered 2017-08-11: 60 mL

## 2017-08-11 MED ORDER — ACETAMINOPHEN 10 MG/ML IV SOLN
INTRAVENOUS | Status: DC | PRN
  Administered 2017-08-11: 1000 mg via INTRAVENOUS

## 2017-08-11 MED ORDER — OXYCODONE HCL 5 MG PO TABS
5.0000 mg | ORAL_TABLET | ORAL | Status: DC | PRN
  Administered 2017-08-11 – 2017-08-13 (×5): 5 mg via ORAL
  Filled 2017-08-11 (×6): qty 1

## 2017-08-11 MED ORDER — LOSARTAN POTASSIUM 50 MG PO TABS
100.0000 mg | ORAL_TABLET | Freq: Every day | ORAL | Status: DC
  Administered 2017-08-12 – 2017-08-13 (×2): 100 mg via ORAL
  Filled 2017-08-11 (×2): qty 2

## 2017-08-11 MED ORDER — DULOXETINE HCL 60 MG PO CPEP
60.0000 mg | ORAL_CAPSULE | Freq: Every evening | ORAL | Status: DC
  Administered 2017-08-12: 60 mg via ORAL
  Filled 2017-08-11 (×2): qty 1

## 2017-08-11 MED ORDER — OXYCODONE HCL 5 MG/5ML PO SOLN: 5.0000 mg | Freq: Once | ORAL | Status: DC | PRN

## 2017-08-11 MED ORDER — METOCLOPRAMIDE HCL 10 MG PO TABS
10.0000 mg | ORAL_TABLET | Freq: Three times a day (TID) | ORAL | Status: DC
  Administered 2017-08-11 – 2017-08-13 (×6): 10 mg via ORAL
  Filled 2017-08-11 (×6): qty 1

## 2017-08-11 MED ORDER — CHLORHEXIDINE GLUCONATE 4 % EX LIQD: 60.0000 mL | Freq: Once | CUTANEOUS | Status: DC

## 2017-08-11 MED ORDER — MORPHINE SULFATE (PF) 2 MG/ML IV SOLN: 2.0000 mg | INTRAVENOUS | Status: DC | PRN

## 2017-08-11 MED ORDER — SODIUM CHLORIDE 0.9 % IV SOLN
1000.0000 mg | Freq: Once | INTRAVENOUS | Status: AC
  Administered 2017-08-11: 1000 mg via INTRAVENOUS
  Filled 2017-08-11: qty 10

## 2017-08-11 MED ORDER — ACETAMINOPHEN 650 MG RE SUPP: 650.0000 mg | RECTAL | Status: DC | PRN

## 2017-08-11 MED ORDER — FENTANYL CITRATE (PF) 100 MCG/2ML IJ SOLN
INTRAMUSCULAR | Status: DC | PRN
  Administered 2017-08-11: 75 ug via INTRAVENOUS
  Administered 2017-08-11: 25 ug via INTRAVENOUS

## 2017-08-11 MED ORDER — PHENYLEPHRINE HCL 10 MG/ML IJ SOLN
INTRAMUSCULAR | Status: DC | PRN
  Administered 2017-08-11: 100 ug via INTRAVENOUS

## 2017-08-11 MED ORDER — NAPHAZOLINE-GLYCERIN 0.012-0.2 % OP SOLN: 1.0000 [drp] | Freq: Four times a day (QID) | OPHTHALMIC | Status: DC | PRN

## 2017-08-11 MED ORDER — ATORVASTATIN CALCIUM 10 MG PO TABS
10.0000 mg | ORAL_TABLET | Freq: Every day | ORAL | Status: DC
  Administered 2017-08-11 – 2017-08-12 (×2): 10 mg via ORAL
  Filled 2017-08-11 (×2): qty 1

## 2017-08-11 MED ORDER — OXYCODONE HCL 5 MG PO TABS
10.0000 mg | ORAL_TABLET | ORAL | Status: DC | PRN
  Administered 2017-08-12 (×2): 10 mg via ORAL
  Filled 2017-08-11 (×2): qty 2

## 2017-08-11 MED ORDER — DIPHENHYDRAMINE HCL 25 MG PO TABS: 25.0000 mg | ORAL_TABLET | Freq: Four times a day (QID) | ORAL | Status: DC | PRN

## 2017-08-11 MED ORDER — TETRACAINE HCL 1 % IJ SOLN
INTRAMUSCULAR | Status: DC | PRN
  Administered 2017-08-11: 5 mg via INTRASPINAL

## 2017-08-11 MED ORDER — DEXAMETHASONE SODIUM PHOSPHATE 4 MG/ML IJ SOLN
INTRAMUSCULAR | Status: DC | PRN
  Administered 2017-08-11: 5 mg via INTRAVENOUS

## 2017-08-11 MED ORDER — MEPERIDINE HCL 50 MG/ML IJ SOLN: 6.2500 mg | INTRAMUSCULAR | Status: DC | PRN

## 2017-08-11 MED ORDER — POLYVINYL ALCOHOL 1.4 % OP SOLN
1.0000 [drp] | Freq: Three times a day (TID) | OPHTHALMIC | Status: DC | PRN
  Filled 2017-08-11: qty 15

## 2017-08-11 MED ORDER — BUPIVACAINE HCL (PF) 0.25 % IJ SOLN
INTRAMUSCULAR | Status: AC
  Filled 2017-08-11: qty 30

## 2017-08-11 MED ORDER — MIDAZOLAM HCL 2 MG/2ML IJ SOLN
INTRAMUSCULAR | Status: AC
  Filled 2017-08-11: qty 2

## 2017-08-11 MED ORDER — METHIMAZOLE 10 MG PO TABS
10.0000 mg | ORAL_TABLET | Freq: Every day | ORAL | Status: DC
  Administered 2017-08-11 – 2017-08-13 (×3): 10 mg via ORAL
  Filled 2017-08-11 (×3): qty 1

## 2017-08-11 MED ORDER — FENTANYL CITRATE (PF) 100 MCG/2ML IJ SOLN
25.0000 ug | INTRAMUSCULAR | Status: DC | PRN
  Administered 2017-08-11 (×3): 25 ug via INTRAVENOUS

## 2017-08-11 MED ORDER — LACTATED RINGERS IV SOLN
INTRAVENOUS | Status: DC
  Administered 2017-08-11 (×2): via INTRAVENOUS

## 2017-08-11 MED ORDER — ACETAMINOPHEN 10 MG/ML IV SOLN
INTRAVENOUS | Status: AC
  Filled 2017-08-11: qty 100

## 2017-08-11 MED ORDER — RISAQUAD PO CAPS
1.0000 | ORAL_CAPSULE | Freq: Two times a day (BID) | ORAL | Status: DC
  Administered 2017-08-11 – 2017-08-13 (×4): 1 via ORAL
  Filled 2017-08-11 (×5): qty 1

## 2017-08-11 MED ORDER — DEXTROSE 5 % IV SOLN
2.0000 g | Freq: Four times a day (QID) | INTRAVENOUS | Status: AC
  Administered 2017-08-11 – 2017-08-12 (×4): 2 g via INTRAVENOUS
  Filled 2017-08-11 (×5): qty 2000

## 2017-08-11 MED ORDER — SODIUM CHLORIDE 0.9 % IV SOLN
INTRAVENOUS | Status: DC | PRN
  Administered 2017-08-11: 30 ug/min via INTRAVENOUS

## 2017-08-11 MED ORDER — DULOXETINE HCL 30 MG PO CPEP
30.0000 mg | ORAL_CAPSULE | Freq: Every evening | ORAL | Status: DC
  Filled 2017-08-11 (×2): qty 1

## 2017-08-11 MED ORDER — TRAMADOL HCL 50 MG PO TABS
50.0000 mg | ORAL_TABLET | ORAL | Status: DC | PRN
  Filled 2017-08-11: qty 2

## 2017-08-11 MED ORDER — ONDANSETRON HCL 4 MG PO TABS: 4.0000 mg | ORAL_TABLET | Freq: Four times a day (QID) | ORAL | Status: DC | PRN

## 2017-08-11 MED ORDER — SUCRALFATE 1 G PO TABS
1.0000 g | ORAL_TABLET | Freq: Four times a day (QID) | ORAL | Status: DC
  Administered 2017-08-11 – 2017-08-13 (×7): 1 g via ORAL
  Filled 2017-08-11 (×7): qty 1

## 2017-08-11 MED ORDER — BISACODYL 10 MG RE SUPP
10.0000 mg | Freq: Every day | RECTAL | Status: DC | PRN
  Administered 2017-08-13: 10 mg via RECTAL
  Filled 2017-08-11: qty 1

## 2017-08-11 MED ORDER — CELECOXIB 200 MG PO CAPS
200.0000 mg | ORAL_CAPSULE | Freq: Two times a day (BID) | ORAL | Status: DC
  Administered 2017-08-11 – 2017-08-13 (×4): 200 mg via ORAL
  Filled 2017-08-11 (×4): qty 1

## 2017-08-11 MED ORDER — LOSARTAN POTASSIUM-HCTZ 100-25 MG PO TABS: 1.0000 | ORAL_TABLET | Freq: Every day | ORAL | Status: DC

## 2017-08-11 MED ORDER — FENTANYL CITRATE (PF) 100 MCG/2ML IJ SOLN
INTRAMUSCULAR | Status: AC
  Filled 2017-08-11: qty 2

## 2017-08-11 SURGICAL SUPPLY — 66 items
BATTERY INSTRU NAVIGATION (MISCELLANEOUS) ×8 IMPLANT
BLADE SAW 1 (BLADE) ×2 IMPLANT
BLADE SAW 1/2 (BLADE) ×2 IMPLANT
BLADE SAW 70X12.5 (BLADE) IMPLANT
BTRY SRG DRVR LF (MISCELLANEOUS) ×4
CANISTER SUCT 1200ML W/VALVE (MISCELLANEOUS) ×2 IMPLANT
CANISTER SUCT 3000ML PPV (MISCELLANEOUS) ×4 IMPLANT
CAPT KNEE TOTAL 3 ATTUNE ×1 IMPLANT
CEMENT HV SMART SET (Cement) ×4 IMPLANT
COOLER POLAR GLACIER W/PUMP (MISCELLANEOUS) ×2 IMPLANT
CUFF TOURN 30 STER DUAL PORT (MISCELLANEOUS) ×1 IMPLANT
DRAPE SHEET LG 3/4 BI-LAMINATE (DRAPES) ×2 IMPLANT
DRSG DERMACEA 8X12 NADH (GAUZE/BANDAGES/DRESSINGS) ×2 IMPLANT
DRSG OPSITE POSTOP 4X14 (GAUZE/BANDAGES/DRESSINGS) ×2 IMPLANT
DRSG TEGADERM 4X4.75 (GAUZE/BANDAGES/DRESSINGS) ×2 IMPLANT
DURAPREP 26ML APPLICATOR (WOUND CARE) ×4 IMPLANT
ELECT CAUTERY BLADE 6.4 (BLADE) ×2 IMPLANT
ELECT REM PT RETURN 9FT ADLT (ELECTROSURGICAL) ×2
ELECTRODE REM PT RTRN 9FT ADLT (ELECTROSURGICAL) ×1 IMPLANT
EVACUATOR 1/8 PVC DRAIN (DRAIN) ×2 IMPLANT
EX-PIN ORTHOLOCK NAV 4X150 (PIN) ×4 IMPLANT
GLOVE BIOGEL M STRL SZ7.5 (GLOVE) ×4 IMPLANT
GLOVE BIOGEL PI IND STRL 9 (GLOVE) ×1 IMPLANT
GLOVE BIOGEL PI INDICATOR 9 (GLOVE) ×1
GLOVE INDICATOR 8.0 STRL GRN (GLOVE) ×2 IMPLANT
GLOVE SURG SYN 9.0  PF PI (GLOVE) ×1
GLOVE SURG SYN 9.0 PF PI (GLOVE) ×1 IMPLANT
GOWN STRL REUS W/ TWL LRG LVL3 (GOWN DISPOSABLE) ×2 IMPLANT
GOWN STRL REUS W/TWL 2XL LVL3 (GOWN DISPOSABLE) ×2 IMPLANT
GOWN STRL REUS W/TWL LRG LVL3 (GOWN DISPOSABLE) ×4
HOLDER FOLEY CATH W/STRAP (MISCELLANEOUS) ×2 IMPLANT
HOOD PEEL AWAY FLYTE STAYCOOL (MISCELLANEOUS) ×4 IMPLANT
KIT RM TURNOVER STRD PROC AR (KITS) ×2 IMPLANT
KNIFE SCULPS 14X20 (INSTRUMENTS) ×2 IMPLANT
LABEL OR SOLS (LABEL) ×2 IMPLANT
NDL SAFETY ECLIPSE 18X1.5 (NEEDLE) ×1 IMPLANT
NDL SPNL 20GX3.5 QUINCKE YW (NEEDLE) ×2 IMPLANT
NEEDLE HYPO 18GX1.5 SHARP (NEEDLE) ×2
NEEDLE SPNL 20GX3.5 QUINCKE YW (NEEDLE) ×4 IMPLANT
NS IRRIG 500ML POUR BTL (IV SOLUTION) ×2 IMPLANT
PACK TOTAL KNEE (MISCELLANEOUS) ×2 IMPLANT
PAD WRAPON POLAR KNEE (MISCELLANEOUS) ×1 IMPLANT
PIN DRILL QUICK PACK ×2 IMPLANT
PIN FIXATION 1/8DIA X 3INL (PIN) ×2 IMPLANT
PULSAVAC PLUS IRRIG FAN TIP (DISPOSABLE) ×2
SOL .9 NS 3000ML IRR  AL (IV SOLUTION) ×1
SOL .9 NS 3000ML IRR AL (IV SOLUTION) ×1
SOL .9 NS 3000ML IRR UROMATIC (IV SOLUTION) ×1 IMPLANT
SOL PREP PVP 2OZ (MISCELLANEOUS) ×2
SOLUTION PREP PVP 2OZ (MISCELLANEOUS) ×1 IMPLANT
SPONGE DRAIN TRACH 4X4 STRL 2S (GAUZE/BANDAGES/DRESSINGS) ×2 IMPLANT
STAPLER SKIN PROX 35W (STAPLE) ×2 IMPLANT
STOCKINETTE IMPERV 14X48 (MISCELLANEOUS) ×1 IMPLANT
STRAP TIBIA SHORT (MISCELLANEOUS) ×2 IMPLANT
SUCTION FRAZIER HANDLE 10FR (MISCELLANEOUS) ×1
SUCTION TUBE FRAZIER 10FR DISP (MISCELLANEOUS) ×1 IMPLANT
SUT VIC AB 0 CT1 36 (SUTURE) ×2 IMPLANT
SUT VIC AB 1 CT1 36 (SUTURE) ×4 IMPLANT
SUT VIC AB 2-0 CT2 27 (SUTURE) ×2 IMPLANT
SYR 20CC LL (SYRINGE) ×2 IMPLANT
SYR 30ML LL (SYRINGE) ×4 IMPLANT
TIP FAN IRRIG PULSAVAC PLUS (DISPOSABLE) ×1 IMPLANT
TOWEL OR 17X26 4PK STRL BLUE (TOWEL DISPOSABLE) ×2 IMPLANT
TOWER CARTRIDGE SMART MIX (DISPOSABLE) ×2 IMPLANT
TRAY FOLEY W/METER SILVER 16FR (SET/KITS/TRAYS/PACK) ×2 IMPLANT
WRAPON POLAR PAD KNEE (MISCELLANEOUS) ×2

## 2017-08-11 NOTE — Transfer of Care (Signed)
Immediate Anesthesia Transfer of Care Note  Patient: Christine Higgins  Procedure(s) Performed: COMPUTER ASSISTED TOTAL KNEE ARTHROPLASTY (Left )  Patient Location: PACU  Anesthesia Type:Spinal  Level of Consciousness: awake, alert  and oriented  Airway & Oxygen Therapy: Patient Spontanous Breathing and Patient connected to nasal cannula oxygen  Post-op Assessment: Report given to RN and Post -op Vital signs reviewed and stable  Post vital signs: Reviewed and stable  Last Vitals:  Vitals:   08/11/17 1101 08/11/17 1523  BP: 140/74 (!) 128/96  Pulse: 94   Resp: 17 15  Temp: 36.6 C 36.6 C  SpO2: 100%     Last Pain:  Vitals:   08/11/17 1101  TempSrc: Temporal  PainSc: 8       Patients Stated Pain Goal: 2 (54/56/25 [id number])  Complications: No apparent anesthesia complications

## 2017-08-11 NOTE — H&P (Signed)
The patient has been re-examined, and the chart reviewed, and there have been no interval changes to the documented history and physical.    The risks, benefits, and alternatives have been discussed at length. The patient expressed understanding of the risks benefits and agreed with plans for surgical intervention.  James P. Hooten, Jr. M.D.    

## 2017-08-11 NOTE — Discharge Instructions (Signed)
°  Instructions after Total Knee Replacement ° ° Amilliana Hayworth P. Khori Rosevear, Jr., M.D.    ° Dept. of Orthopaedics & Sports Medicine ° [facility] ° [address] ° [place], [place]  [address] ° Phone: [phone]   Fax: [phone] ° °  °DIET: °• Drink plenty of non-alcoholic fluids. °• Resume your normal diet. Include foods high in fiber. ° °ACTIVITY:  °• You may use crutches or a walker with weight-bearing as tolerated, unless instructed otherwise. °• You may be weaned off of the walker or crutches by your Physical Therapist.  °• Do NOT place pillows under the knee. Anything placed under the knee could limit your ability to straighten the knee.   °• Continue doing gentle exercises. Exercising will reduce the pain and swelling, increase motion, and prevent muscle weakness.   °• Please continue to use the TED compression stockings for 6 weeks. You may remove the stockings at night, but should reapply them in the morning. °• Do not drive or operate any equipment until instructed. ° °WOUND CARE:  °• Continue to use the PolarCare or ice packs periodically to reduce pain and swelling. °• You may bathe or shower after the staples are removed at the first office visit following surgery. ° °MEDICATIONS: °• You may resume your regular medications. °• Please take the pain medication as prescribed on the medication. °• Do not take pain medication on an empty stomach. °• You have been given a prescription for a blood thinner (Lovenox or Coumadin). Please take the medication as instructed. (NOTE: After completing a 2 week course of Lovenox, take one Enteric-coated aspirin once a day. This along with elevation will help reduce the possibility of phlebitis in your operated leg.) °• Do not drive or drink alcoholic beverages when taking pain medications. ° °CALL THE OFFICE FOR: °• Temperature above 101 degrees °• Excessive bleeding or drainage on the dressing. °• Excessive swelling, coldness, or paleness of the toes. °• Persistent  nausea and vomiting. ° °FOLLOW-UP:  °• You should have an appointment to return to the office in 10-14 days after surgery. °• Arrangements have been made for continuation of Physical Therapy (either home therapy or outpatient therapy). °  °

## 2017-08-11 NOTE — NC FL2 (Signed)
[place] LEVEL OF CARE SCREENING TOOL     IDENTIFICATION  Patient Name: Christine Higgins Birthdate: 27-Jan-1956 Sex: female Admission Date (Current Location): 08/11/2017  [facility] and [place] Number:  Christine Higgins ([id number]) Facility and Address:  [facility], [address] [place], [place] [address]      Provider Number: [id number]  Attending Physician Name and Address:  Dereck Leep, MD  Relative Name and Phone Number:       Current Level of Care: Hospital Recommended Level of Care: [place] Prior Approval Number:    Date Approved/Denied:   PASRR Number: [id number])  Discharge Plan: SNF    Current Diagnoses: Patient Active Problem List   Diagnosis Date Noted  . S/P total knee arthroplasty 08/11/2017  . Chronic pain syndrome 07/30/2017  . Fibromyalgia 07/30/2017  . Pelvic pain in female 06/24/2017  . Dysuria 06/24/2017  . Right carpal tunnel syndrome 03/27/2017  . HTN, goal below 140/90 03/05/2017  . Graves' orbitopathy 02/17/2017  . Primary osteoarthritis of right knee 10/18/2016  . Primary osteoarthritis of left knee 10/18/2016  . Bilateral arm weakness 07/16/2016  . Depression, major, in remission ([place]) 12/21/2015  . Back pain at L4-L5 level 10/30/2015  . DDD (degenerative disc disease), lumbosacral 10/30/2015  . Status post total replacement of right hip 08/21/2015  . Leg pain 07/28/2015  . Primary osteoarthritis of both knees 07/28/2015  . Health care maintenance 06/21/2015  . Impingement syndrome of shoulder, left 10/20/2014  . Severe obesity (BMI 35.0-35.9 with comorbidity) ([place]) 08/09/2014  . Atherosclerosis of abdominal aorta ([place]) 07/29/2014  . DM II (diabetes mellitus, type II), controlled ([place]) 04/19/2014  . Hemorrhoids 03/14/2014  . Osteoporosis 02/03/2014  . Migraines 02/03/2014  . Hyperlipidemia, unspecified 02/03/2014  . GERD (gastroesophageal reflux disease) 02/03/2014     Orientation RESPIRATION BLADDER Height & Weight     Self, Time, Situation, Place  Normal Continent Weight: 266 lb 3.2 oz (120.7 kg) Height:  5\' 11"  (180.3 cm)  BEHAVIORAL SYMPTOMS/MOOD NEUROLOGICAL BOWEL NUTRITION STATUS      Continent Diet(Diet: Clear Liquid to be Advanced. )  AMBULATORY STATUS COMMUNICATION OF NEEDS Skin   Extensive Assist Verbally Surgical wounds                       Personal Care Assistance Level of Assistance  Bathing, Feeding, Dressing Bathing Assistance: Limited assistance Feeding assistance: Independent Dressing Assistance: Limited assistance     Functional Limitations Info  Sight, Hearing, Speech Sight Info: Adequate Hearing Info: Adequate Speech Info: Adequate    SPECIAL CARE FACTORS FREQUENCY  PT (By licensed PT), OT (By licensed OT)     PT Frequency: (5) OT Frequency: (5)            Contractures      Additional Factors Info  Code Status, Allergies Code Status Info: (Full Code. ) Allergies Info: (Ibuprofen, Shellfish Allergy, Aspirin)           Current Medications (08/11/2017):  This is the current hospital active medication list Current Facility-Administered Medications  Medication Dose Route Frequency Provider Last Rate Last Dose  . 0.9 %  sodium chloride infusion   Intravenous Continuous Hooten, Laurice Record, MD      . acetaminophen (OFIRMEV) IV 1,000 mg  1,000 mg Intravenous Q6H Hooten, Laurice Record, MD      . acetaminophen (TYLENOL) tablet 650 mg  650 mg Oral Q4H PRN Hooten, Laurice Record, MD  Or  . acetaminophen (TYLENOL) suppository 650 mg  650 mg Rectal Q4H PRN Hooten, Laurice Record, MD      . ALIVE WOMENS ENERGY TABS 1 tablet  1 tablet Oral Daily Hooten, Laurice Record, MD      . alum & mag hydroxide-simeth (MAALOX/MYLANTA) 200-200-20 MG/5ML suspension 30 mL  30 mL Oral Q4H PRN Hooten, Laurice Record, MD      . atorvastatin (LIPITOR) tablet 10 mg  10 mg Oral QPC supper Hooten, Laurice Record, MD      . bisacodyl (DULCOLAX) suppository 10 mg  10 mg  Rectal Daily PRN Hooten, Laurice Record, MD      . calcium carbonate (OS-CAL - dosed in mg of elemental calcium) tablet 500 mg of elemental calcium  1 tablet Oral QPM Hooten, Laurice Record, MD      . ceFAZolin (ANCEF) IVPB 2g/100 mL premix  2 g Intravenous Q6H Hooten, Laurice Record, MD      . celecoxib (CELEBREX) capsule 200 mg  200 mg Oral BID Hooten, Laurice Record, MD      . cholecalciferol (VITAMIN D) tablet 1,000 Units  1,000 Units Oral QPM Hooten, Laurice Record, MD      . ciclopirox (PENLAC) 8 % solution 1 application  1 application Topical BID PRN Hooten, Laurice Record, MD      . clobetasol (TEMOVATE) 0.05 % external solution 1 application  1 application Topical BID PRN Hooten, Laurice Record, MD      . clonazePAM (KLONOPIN) tablet 1 mg  1 mg Oral QHS PRN Hooten, Laurice Record, MD      . clotrimazole (LOTRIMIN) 1 % cream   Topical BID PRN Hooten, Laurice Record, MD      . darifenacin (ENABLEX) 24 hr tablet 15 mg  15 mg Oral Daily Hooten, Laurice Record, MD      . diphenhydrAMINE (BENADRYL) 12.5 MG/5ML elixir 12.5-25 mg  12.5-25 mg Oral Q4H PRN Hooten, Laurice Record, MD      . diphenhydrAMINE (BENADRYL) tablet 25 mg  25 mg Oral Q6H PRN Hooten, Laurice Record, MD      . DULoxetine (CYMBALTA) DR capsule 30 mg  30 mg Oral QPM Hooten, Laurice Record, MD      . DULoxetine (CYMBALTA) DR capsule 60 mg  60 mg Oral QPM Hooten, Laurice Record, MD      . Derrill Memo ON 08/12/2017] enoxaparin (LOVENOX) injection 30 mg  30 mg Subcutaneous Q12H Hooten, Laurice Record, MD      . ferrous sulfate tablet 325 mg  325 mg Oral BID WC Hooten, Laurice Record, MD      . gabapentin (NEURONTIN) capsule 300 mg  300 mg Oral TID Hooten, Laurice Record, MD      . Derrill Memo ON 08/12/2017] linaclotide (LINZESS) capsule 290 mcg  290 mcg Oral QAC breakfast Hooten, Laurice Record, MD      . losartan-hydrochlorothiazide (HYZAAR) 100-25 MG per tablet 1 tablet  1 tablet Oral Daily Hooten, Laurice Record, MD      . magnesium hydroxide (MILK OF MAGNESIA) suspension 30 mL  30 mL Oral Daily PRN Hooten, Laurice Record, MD      . menthol-cetylpyridinium (CEPACOL) lozenge 3  mg  1 lozenge Oral PRN Hooten, Laurice Record, MD       Or  . phenol (CHLORASEPTIC) mouth spray 1 spray  1 spray Mouth/Throat PRN Hooten, Laurice Record, MD      . methimazole (TAPAZOLE) tablet 10 mg  10 mg Oral Daily Hooten, Laurice Record, MD      .  metoCLOPramide (REGLAN) tablet 10 mg  10 mg Oral TID AC & HS Hooten, Laurice Record, MD      . morphine 2 MG/ML injection 2 mg  2 mg Intravenous Q2H PRN Hooten, Laurice Record, MD      . naphazoline-glycerin (CLEAR EYES REDNESS) ophth solution 1-2 drop  1-2 drop Both Eyes QID PRN Hooten, Laurice Record, MD      . ondansetron (ZOFRAN) tablet 4 mg  4 mg Oral Q6H PRN Hooten, Laurice Record, MD       Or  . ondansetron (ZOFRAN) injection 4 mg  4 mg Intravenous Q6H PRN Hooten, Laurice Record, MD      . oxyCODONE (Oxy IR/ROXICODONE) immediate release tablet 10 mg  10 mg Oral Q3H PRN Hooten, Laurice Record, MD      . oxyCODONE (Oxy IR/ROXICODONE) immediate release tablet 5 mg  5 mg Oral Q3H PRN Hooten, Laurice Record, MD      . pantoprazole (PROTONIX) EC tablet 40 mg  40 mg Oral BID Hooten, Laurice Record, MD      . Probiotic CAPS 1 capsule  1 capsule Oral BID Hooten, Laurice Record, MD      . Propylene Glycol 0.6 % SOLN 1-2 drop  1-2 drop Both Eyes TID PRN Hooten, Laurice Record, MD      . senna-docusate (Senokot-S) tablet 1 tablet  1 tablet Oral BID Hooten, Laurice Record, MD      . sodium chloride (OCEAN) 0.65 % nasal spray 1-2 spray  1-2 spray Each Nare Daily PRN Hooten, Laurice Record, MD      . sodium phosphate (FLEET) 7-19 GM/118ML enema 1 enema  1 enema Rectal Once PRN Hooten, Laurice Record, MD      . sucralfate (CARAFATE) tablet 1 g  1 g Oral QID Hooten, Laurice Record, MD      . tiZANidine (ZANAFLEX) tablet 4 mg  4 mg Oral TID PRN Hooten, Laurice Record, MD      . traMADol (ULTRAM) tablet 50-100 mg  50-100 mg Oral Q4H PRN Hooten, Laurice Record, MD      . traZODone (DESYREL) tablet 150 mg  150 mg Oral QHS Hooten, Laurice Record, MD      . vitamin B-12 (CYANOCOBALAMIN) tablet 1,000 mcg  1,000 mcg Oral QPM Hooten, Laurice Record, MD      . vitamin C (ASCORBIC ACID) tablet 1,000 mg  1,000  mg Oral QPM Hooten, Laurice Record, MD      . vitamin E capsule 400 Units  400 Units Oral Daily Hooten, Laurice Record, MD         Discharge Medications: Please see discharge summary for a list of discharge medications.  Relevant Imaging Results:  Relevant Lab Results:   Additional Information (SSN: [id number])  Emiliano Welshans, Veronia Beets, LCSW

## 2017-08-11 NOTE — Progress Notes (Signed)
Patient needs eye drops ordered. MD paged. Orders received.

## 2017-08-11 NOTE — Op Note (Signed)
OPERATIVE NOTE  DATE OF SURGERY:  08/11/2017  PATIENT NAME:  Christine Higgins   DOB: [birth year]  MRN: [id number]  PRE-OPERATIVE DIAGNOSIS: Degenerative arthrosis of the left knee, primary  POST-OPERATIVE DIAGNOSIS:  Same  PROCEDURE:  Left total knee arthroplasty using computer-assisted navigation  SURGEON:  Marciano Sequin. M.D.  ASSISTANT:  Vance Peper, PA (present and scrubbed throughout the case, critical for assistance with exposure, retraction, instrumentation, and closure)  ANESTHESIA: spinal  ESTIMATED BLOOD LOSS: 50 mL  FLUIDS REPLACED: 1100 mL of crystalloid  TOURNIQUET TIME: 100 minutes  DRAINS: 2 medium Hemovac drains  SOFT TISSUE RELEASES: Anterior cruciate ligament, posterior cruciate ligament, deep medial collateral ligament, patellofemoral ligament  IMPLANTS UTILIZED: DePuy Attune size 6 posterior stabilized femoral component (cemented), size 7 rotating platform tibial component (cemented), 41 mm medialized dome patella (cemented), and a 5 mm stabilized rotating platform polyethylene insert.  INDICATIONS FOR SURGERY: Christine Higgins is a 61 y.o. year old female with a long history of progressive knee pain. X-rays demonstrated severe degenerative changes in tricompartmental fashion. The patient had not seen any significant improvement despite conservative nonsurgical intervention. After discussion of the risks and benefits of surgical intervention, the patient expressed understanding of the risks benefits and agree with plans for total knee arthroplasty.   The risks, benefits, and alternatives were discussed at length including but not limited to the risks of infection, bleeding, nerve injury, stiffness, blood clots, the need for revision surgery, cardiopulmonary complications, among others, and they were willing to proceed.  PROCEDURE IN DETAIL: The patient was brought into the operating room and, after adequate spinal anesthesia was achieved, a tourniquet was placed on  the patient's upper thigh. The patient's knee and leg were cleaned and prepped with alcohol and DuraPrep and draped in the usual sterile fashion. A "timeout" was performed as per usual protocol. The lower extremity was exsanguinated using an Esmarch, and the tourniquet was inflated to 300 mmHg. An anterior longitudinal incision was made followed by a standard mid vastus approach. The deep fibers of the medial collateral ligament were elevated in a subperiosteal fashion off of the medial flare of the tibia so as to maintain a continuous soft tissue sleeve. The patella was subluxed laterally and the patellofemoral ligament was incised. Inspection of the knee demonstrated severe degenerative changes with full-thickness loss of articular cartilage. Osteophytes were debrided using a rongeur. Anterior and posterior cruciate ligaments were excised. Two 4.0 mm Schanz pins were inserted in the femur and into the tibia for attachment of the array of trackers used for computer-assisted navigation. Hip center was identified using a circumduction technique. Distal landmarks were mapped using the computer. The distal femur and proximal tibia were mapped using the computer. The distal femoral cutting guide was positioned using computer-assisted navigation so as to achieve a 5 distal valgus cut. The femur was sized and it was felt that a size 6 femoral component was appropriate. A size 6 femoral cutting guide was positioned and the anterior cut was performed and verified using the computer. This was followed by completion of the posterior and chamfer cuts. Femoral cutting guide for the central box was then positioned in the center box cut was performed.  Attention was then directed to the proximal tibia. Medial and lateral menisci were excised. The extramedullary tibial cutting guide was positioned using computer-assisted navigation so as to achieve a 0 varus-valgus alignment and 3 posterior slope. The cut was performed and  verified using the computer. The proximal tibia  was sized and it was felt that a size 7 tibial tray was appropriate. Tibial and femoral trials were inserted followed by insertion of a 5 mm polyethylene insert.This allowed for excellent mediolateral soft tissue balancing both in flexion and in full extension. Finally, the patella was cut and prepared so as to accommodate a 41 mm medialized dome patella. A patella trial was placed and the knee was placed through a range of motion with excellent patellar tracking appreciated. The femoral trial was removed after debridement of posterior osteophytes. The central post-hole for the tibial component was reamed followed by insertion of a keel punch. Tibial trials were then removed. Cut surfaces of bone were irrigated with copious amounts of normal saline with antibiotic solution using pulsatile lavage and then suctioned dry. Polymethylmethacrylate cement was prepared in the usual fashion using a vacuum mixer. Cement was applied to the cut surface of the proximal tibia as well as along the undersurface of a size 7 rotating platform tibial component. Tibial component was positioned and impacted into place. Excess cement was removed using [employer] fast streamer. Cement was then applied to the cut surfaces of the femur as well as along the posterior flanges of the size 6 femoral component. The femoral component was positioned and impacted into place. Excess cement was removed using [employer] fast streamer. A 5 mm polyethylene trial was inserted and the knee was brought into full extension with steady axial compression applied. Finally, cement was applied to the backside of a 41 mm medialized dome patella and the patellar component was positioned and patellar clamp applied. Excess cement was removed using [employer] fast streamer. After adequate curing of the cement, the tourniquet was deflated after a total tourniquet time of 100 minutes. Hemostasis was achieved using electrocautery. The knee was  irrigated with copious amounts of normal saline with antibiotic solution using pulsatile lavage and then suctioned dry. 20 mL of 1.3% Exparel and 60 mL of 0.25% Marcaine in 40 mL of normal saline was injected along the posterior capsule, medial and lateral gutters, and along the arthrotomy site. A 5 mm stabilized rotating platform polyethylene insert was inserted and the knee was placed through a range of motion with excellent mediolateral soft tissue balancing appreciated and excellent patellar tracking noted. 2 medium drains were placed in the wound bed and brought out through separate stab incisions. The medial parapatellar portion of the incision was reapproximated using interrupted sutures of #1 Vicryl. Subcutaneous tissue was approximated in layers using first #0 Vicryl followed #2-0 Vicryl. The skin was approximated with skin staples. A sterile dressing was applied.  The patient tolerated the procedure well and was transported to the recovery room in stable condition.    Bre Pecina P. Holley Bouche., M.D.

## 2017-08-11 NOTE — Anesthesia Post-op Follow-up Note (Signed)
Anesthesia QCDR form completed.        

## 2017-08-11 NOTE — Anesthesia Procedure Notes (Signed)
Spinal  Patient location during procedure: OR Start time: 08/11/2017 11:24 AM End time: 08/11/2017 11:39 AM Staffing Anesthesiologist: Emmie Niemann, MD Resident/CRNA: Bernardo Heater, CRNA Performed: resident/CRNA  Preanesthetic Checklist Completed: patient identified, site marked, surgical consent, pre-op evaluation, timeout performed, IV checked, risks and benefits discussed and monitors and equipment checked Spinal Block Patient position: sitting Prep: ChloraPrep Patient monitoring: heart rate, continuous pulse ox, blood pressure and cardiac monitor Approach: midline Location: L4-5 Injection technique: single-shot Needle Needle type: Introducer and Pencil-Tip  Needle gauge: 24 G Needle length: 9 cm Additional Notes Negative paresthesia. Negative blood return. Positive free-flowing CSF. Expiration date of kit checked and confirmed. Patient tolerated procedure well, without complications.

## 2017-08-11 NOTE — Anesthesia Preprocedure Evaluation (Signed)
Anesthesia Evaluation  Patient identified by MRN, date of birth, ID band Patient awake    Reviewed: Allergy & Precautions, NPO status , Patient's Chart, lab work & pertinent test results  History of Anesthesia Complications Negative for: history of anesthetic complications  Airway Mallampati: II  TM Distance: >3 FB Neck ROM: Full    Dental  (+) Upper Dentures   Pulmonary neg sleep apnea, neg COPD, former smoker,           Cardiovascular hypertension, Pt. on medications (-) CAD, (-) Past MI and (-) Cardiac Stents      Neuro/Psych  Headaches, PSYCHIATRIC DISORDERS Depression    GI/Hepatic Neg liver ROS, GERD  ,  Endo/Other  diabetes  Renal/GU negative Renal ROS     Musculoskeletal  (+) Arthritis , Fibromyalgia -  Abdominal (+) + obese,   Peds  Hematology negative hematology ROS (+)   Anesthesia Other Findings Past Medical History: No date: Breast mass     Comment:  LEFT x 3 months per pt and palpated by physician No date: Cervical disc disease No date: Chronic pain syndrome No date: Degenerative disc disease, lumbar No date: Fibromyalgia No date: Fibromyalgia No date: GERD (gastroesophageal reflux disease) No date: Glaucoma No date: Graves disease No date: Hemorrhoids No date: Hyperlipidemia No date: Hypertension No date: Thyroid disease   Reproductive/Obstetrics                             Anesthesia Physical Anesthesia Plan  ASA: III  Anesthesia Plan: Spinal   Post-op Pain Management:    Induction:   PONV Risk Score and Plan: 2 and Propofol infusion and Ondansetron  Airway Management Planned: Natural Airway  Additional Equipment:   Intra-op Plan:   Post-operative Plan:   Informed Consent: I have reviewed the patients History and Physical, chart, labs and discussed the procedure including the risks, benefits and alternatives for the proposed anesthesia with the  patient or authorized representative who has indicated his/her understanding and acceptance.   Dental advisory given  Plan Discussed with: CRNA and Anesthesiologist  Anesthesia Plan Comments:         Anesthesia Quick Evaluation

## 2017-08-12 ENCOUNTER — Encounter
Admission: RE | Admit: 2017-08-12 | Discharge: 2017-08-12 | Disposition: A | Discharge status: Home / Self Care | Payer: Medicare HMO | Source: Ambulatory Visit | Attending: Internal Medicine | Admitting: Internal Medicine

## 2017-08-12 ENCOUNTER — Encounter: Payer: Self-pay | Admitting: Orthopedic Surgery

## 2017-08-12 MED ORDER — ENOXAPARIN SODIUM 30 MG/0.3ML ~~LOC~~ SOLN: 30.0000 mg | Freq: Two times a day (BID) | SUBCUTANEOUS | 0 refills | Status: DC

## 2017-08-12 MED ORDER — OXYCODONE HCL 5 MG PO TABS: 5.0000 mg | ORAL_TABLET | ORAL | 0 refills | Status: DC | PRN

## 2017-08-12 NOTE — Progress Notes (Signed)
Attempt to dangle the patient but resident refused due to pain.

## 2017-08-12 NOTE — Progress Notes (Signed)
ORTHOPAEDICS PROGRESS NOTE  PATIENT NAME: Christine Higgins DOB: 10-Jan-1956  MRN: [id number]  POD # 1: Left total knee arthroplasty  Subjective: Patient slept comfortably last night as per nursing notes. She complains of some surgical pain but denies any nausea or vomiting.  She refused to dangle at bedside last night due to the pain.  Objective: Vital signs in last 24 hours: Temp:  [97.5 F (36.4 C)-98.5 F (36.9 C)] 97.9 F (36.6 C) (12/18 0100) Pulse Rate:  [65-102] 102 (12/18 0421) Resp:  [15-20] 19 (12/18 0421) BP: (97-140)/(54-96) 107/55 (12/18 0421) SpO2:  [98 %-100 %] 99 % (12/18 0421) Weight:  [120.7 kg (266 lb 3.2 oz)] 120.7 kg (266 lb 3.2 oz) (12/17 1629)  Intake/Output from previous day: 12/17 0701 - 12/18 0700 In: 3411.7 [P.O.:660; I.V.:2551.7; IV Piggyback:200] Out: 4260 [Urine:4100; Blood:50]  No results for input(s): WBC, HGB, HCT, PLT, K, CL, CO2, BUN, CREATININE, GLUCOSE, CALCIUM, LABPT, INR in the last 72 hours.  EXAM General: Well-developed well-nourished female seen in no apparent discomfort. Lungs: clear to auscultation Cardiac: normal rate, regular rhythm, normal S1, S2, no murmurs, rubs, clicks or gallops, normal rate and regular rhythm Left lower extremity: Dressing is dry and intact.  Polar Care is functioning.  Hemovac drain is in place.  Fair quadriceps firing.  The patient was unable to perform an independent straight leg raise.  Homans test is negative. Neurologic: Awake, alert, and oriented. Sensory and motor function are grossly intact.   Assessment: Left total knee arthroplasty  Secondary diagnoses: Hypertension Hyperlipidemia Graves' disease Glaucoma Gastroesophageal reflux disease Fibromyalgia Degenerative disc disease of the lumbar spine Chronic pain syndrome Cervical disc disease  Plan: Today's goal were reviewed with the patient.  Begin physical therapy and occupational therapy as per total knee arthroplasty rehab protocol. Plan  is to go Skilled nursing facility after hospital stay. DVT Prophylaxis - Lovenox, Foot Pumps and TED hose  Marguerite Jarboe P. Holley Bouche M.D.

## 2017-08-12 NOTE — Discharge Summary (Signed)
Physician Discharge Summary  Patient ID: Christine Higgins MRN: [id number] DOB/AGE: 02-Dec-1955 61 y.o.  Admit date: 08/11/2017 Discharge date: 08/13/2017  Admission Diagnoses:  primary osteoarthritis   Discharge Diagnoses: Patient Active Problem List   Diagnosis Date Noted  . S/P total knee arthroplasty 08/11/2017  . Chronic pain syndrome 07/30/2017  . Fibromyalgia 07/30/2017  . Pelvic pain in female 06/24/2017  . Dysuria 06/24/2017  . Right carpal tunnel syndrome 03/27/2017  . HTN, goal below 140/90 03/05/2017  . Graves' orbitopathy 02/17/2017  . Primary osteoarthritis of right knee 10/18/2016  . Primary osteoarthritis of left knee 10/18/2016  . Bilateral arm weakness 07/16/2016  . Depression, major, in remission ([place]) 12/21/2015  . Back pain at L4-L5 level 10/30/2015  . DDD (degenerative disc disease), lumbosacral 10/30/2015  . Status post total replacement of right hip 08/21/2015  . Leg pain 07/28/2015  . Primary osteoarthritis of both knees 07/28/2015  . Health care maintenance 06/21/2015  . Impingement syndrome of shoulder, left 10/20/2014  . Severe obesity (BMI 35.0-35.9 with comorbidity) ([place]) 08/09/2014  . Atherosclerosis of abdominal aorta ([place]) 07/29/2014  . DM II (diabetes mellitus, type II), controlled ([place]) 04/19/2014  . Hemorrhoids 03/14/2014  . Osteoporosis 02/03/2014  . Migraines 02/03/2014  . Hyperlipidemia, unspecified 02/03/2014  . GERD (gastroesophageal reflux disease) 02/03/2014    Past Medical History:  Diagnosis Date  . Breast mass    LEFT x 3 months per pt and palpated by physician  . Cervical disc disease   . Chronic pain syndrome   . Degenerative disc disease, lumbar   . Fibromyalgia   . Fibromyalgia   . GERD (gastroesophageal reflux disease)   . Glaucoma   . Graves disease   . Hemorrhoids   . Hyperlipidemia   . Hypertension   . Thyroid disease      Transfusion: No transfusions during this admission   Consultants (if any):    Discharged Condition: Improved  Hospital Course: Christine Higgins is an 61 y.o. female who was admitted 08/11/2017 with a diagnosis of degenerative arthrosis left knee and went to the operating room on 08/11/2017 and underwent the above named procedures.    Surgeries:Procedure(s): COMPUTER ASSISTED TOTAL KNEE ARTHROPLASTY on 08/11/2017   PRE-OPERATIVE DIAGNOSIS: Degenerative arthrosis of the left knee, primary  POST-OPERATIVE DIAGNOSIS:  Same  PROCEDURE:  Left total knee arthroplasty using computer-assisted navigation  SURGEON:  Marciano Sequin. M.D.  ASSISTANT:  Vance Peper, PA (present and scrubbed throughout the case, critical for assistance with exposure, retraction, instrumentation, and closure)  ANESTHESIA: spinal  ESTIMATED BLOOD LOSS: 50 mL  FLUIDS REPLACED: 1100 mL of crystalloid  TOURNIQUET TIME: 100 minutes  DRAINS: 2 medium Hemovac drains  SOFT TISSUE RELEASES: Anterior cruciate ligament, posterior cruciate ligament, deep medial collateral ligament, patellofemoral ligament  IMPLANTS UTILIZED: DePuy Attune size 6 posterior stabilized femoral component (cemented), size 7 rotating platform tibial component (cemented), 41 mm medialized dome patella (cemented), and a 5 mm stabilized rotating platform polyethylene insert.  INDICATIONS FOR SURGERY: Christine Higgins is a 61 y.o. year old female with a long history of progressive knee pain. X-rays demonstrated severe degenerative changes in tricompartmental fashion. The patient had not seen any significant improvement despite conservative nonsurgical intervention. After discussion of the risks and benefits of surgical intervention, the patient expressed understanding of the risks benefits and agree with plans for total knee arthroplasty.   The risks, benefits, and alternatives were discussed at length including but not limited to the risks of infection, bleeding,  nerve injury, stiffness, blood clots, the need for  revision surgery, cardiopulmonary complications, among others, and they were willing to proceed.    Patient tolerated the surgery well. No complications .Patient was taken to PACU where she was stabilized and then transferred to the orthopedic floor.  Patient started on Lovenox 30 mg q 12 hrs. Foot pumps applied bilaterally at 80 mm hgb. Heels elevated off bed with rolled towels. No evidence of DVT. Calves non tender. Negative Homan. Physical therapy started on day #1 for gait training and transfer with OT starting on  day #1 for ADL and assisted devices. Patient has done well with therapy. Ambulated 60 feet upon being discharged.  Patient's IV And Foley were discontinued on day #1 with Hemovac being discontinued on day #2. Dressing was changed on day 2 prior to patient being discharged   She was given perioperative antibiotics:  Anti-infectives (From admission, onward)   Start     Dose/Rate Route Frequency Ordered Stop   08/11/17 1800  ceFAZolin (ANCEF) 2 g in dextrose 5 % 100 mL IVPB     2 g 200 mL/hr over 30 Minutes Intravenous Every 6 hours 08/11/17 1706 08/12/17 1759   08/11/17 1630  ceFAZolin (ANCEF) IVPB 2g/100 mL premix  Status:  Discontinued     2 g 200 mL/hr over 30 Minutes Intravenous Every 6 hours 08/11/17 1624 08/11/17 1705   08/11/17 1048  ceFAZolin (ANCEF) 2-4 GM/100ML-% IVPB    Comments:  Kizziah, Kaitlin   : cabinet override      08/11/17 1048 08/11/17 1148   08/11/17 0600  ceFAZolin (ANCEF) IVPB 2g/100 mL premix     2 g 200 mL/hr over 30 Minutes Intravenous On call to O.R. 08/10/17 2143 08/11/17 1158    .  She was fitted with AV 1 compression foot pump devices, instructed on heel pumps, early ambulation, and fitted with TED stockings bilaterally for DVT prophylaxis.  She benefited maximally from the hospital stay and there were no complications.    Recent vital signs:  Vitals:   08/12/17 0100 08/12/17 0421  BP: 129/76 (!) 107/55  Pulse: 85 (!) 102  Resp: 18  19  Temp: 97.9 F (36.6 C)   SpO2: 99% 99%    Recent laboratory studies:  Lab Results  Component Value Date   HGB 12.7 08/01/2017   HGB 13.2 03/14/2017   HGB 12.7 12/14/2016   Lab Results  Component Value Date   WBC 2.8 (L) 08/01/2017   PLT 176 08/01/2017   Lab Results  Component Value Date   INR 0.94 08/01/2017   Lab Results  Component Value Date   NA 137 08/01/2017   K 3.8 08/01/2017   CL 101 08/01/2017   CO2 28 08/01/2017   BUN 6 08/01/2017   CREATININE 0.62 08/01/2017   GLUCOSE 103 (H) 08/01/2017    Discharge Medications:   Allergies as of 08/12/2017      Reactions   Ibuprofen Nausea And Vomiting   Shellfish Allergy Nausea And Vomiting   Aspirin Nausea And Vomiting   Tramadol Hcl Nausea And Vomiting      Medication List    TAKE these medications   acetaminophen 650 MG CR tablet Commonly known as:  TYLENOL Take 1,300 mg by mouth 2 (two) times daily as needed for pain.   ALIVE WOMENS ENERGY PO Take 1 tablet by mouth daily.   atorvastatin 10 MG tablet Commonly known as:  LIPITOR Take 10 mg by mouth daily after supper.  BAND-AID CALAMINE SPRAY EX Apply 1 application topically 3 (three) times daily as needed (for pain).   calcium carbonate 1250 (500 Ca) MG tablet Commonly known as:  OS-CAL - dosed in mg of elemental calcium Take 1 tablet by mouth every evening.   celecoxib 200 MG capsule Commonly known as:  CELEBREX Take 200 mg by mouth 2 (two) times daily.   cholecalciferol 1000 units tablet Commonly known as:  VITAMIN D Take 1,000 Units by mouth every evening.   ciclopirox 8 % solution Commonly known as:  PENLAC Apply 1 application topically 2 (two) times daily as needed (for nail fungus). Apply over nail and surrounding skin. Apply daily over previous coat. After seven (7) days, may remove with alcohol and continue cycle.   clobetasol 0.05 % external solution Commonly known as:  TEMOVATE Apply 1 application topically 2 (two) times daily  as needed (for dry scalp).   clonazePAM 1 MG tablet Commonly known as:  KLONOPIN Take 1 mg by mouth at bedtime as needed (for sleep/anxiety).   clotrimazole-betamethasone cream Commonly known as:  LOTRISONE Apply 1 application topically 2 (two) times daily as needed (for irritated skin).   diphenhydrAMINE 25 MG tablet Commonly known as:  BENADRYL Take 25 mg by mouth every 6 (six) hours as needed (for allergies.).   DULoxetine 60 MG capsule Commonly known as:  CYMBALTA Take 60 mg by mouth every evening. Total daily dose=90   DULoxetine 30 MG capsule Commonly known as:  CYMBALTA Take 30 mg by mouth every evening. Total dose=90 mg   enoxaparin 30 MG/0.3ML injection Commonly known as:  LOVENOX Inject 0.3 mLs (30 mg total) into the skin every 12 (twelve) hours for 14 days. Start taking on:  08/14/2017   gabapentin 300 MG capsule Commonly known as:  NEURONTIN Take 300 mg by mouth 3 (three) times daily.   ICY HOT EX Apply 1 application topically 3 (three) times daily as needed (for pain.).   linaclotide 290 MCG Caps capsule Commonly known as:  LINZESS Take 290 mcg by mouth daily as needed.   losartan-hydrochlorothiazide 100-25 MG tablet Commonly known as:  HYZAAR Take 1 tablet by mouth daily.   methimazole 10 MG tablet Commonly known as:  TAPAZOLE Take 10 mg by mouth daily.   omeprazole 40 MG capsule Commonly known as:  PRILOSEC Take 40 mg by mouth 2 (two) times daily.   oxyCODONE 5 MG immediate release tablet Commonly known as:  Oxy IR/ROXICODONE Take 1 tablet (5 mg total) by mouth every 3 (three) hours as needed for moderate pain ((score 4 to 6)).   PROBIOTIC PO Take 1 capsule by mouth 2 (two) times daily.   sodium chloride 0.65 % Soln nasal spray Commonly known as:  OCEAN Place 1-2 sprays into both nostrils daily as needed for congestion (for moisture/congestion.).   solifenacin 10 MG tablet Commonly known as:  VESICARE Take 1 tablet (10 mg total) by mouth  daily. What changed:    when to take this  reasons to take this   sucralfate 1 g tablet Commonly known as:  CARAFATE Take 1 g by mouth 4 (four) times daily.   SYSTANE BALANCE 0.6 % Soln Generic drug:  Propylene Glycol Place 1-2 drops into both eyes 3 (three) times daily as needed (for dry eyes.).   tetrahydrozoline 0.05 % ophthalmic solution Place 1-2 drops into both eyes daily as needed (for redness.).   tiZANidine 4 MG tablet Commonly known as:  ZANAFLEX Take 4 mg by mouth 3 (  three) times daily as needed for muscle spasms.   traZODone 150 MG tablet Commonly known as:  DESYREL Take 150 mg by mouth at bedtime.   VITAMIN A PO Take 1 capsule by mouth every evening.   vitamin B-12 1000 MCG tablet Commonly known as:  CYANOCOBALAMIN Take 1,000 mcg by mouth every evening.   vitamin C 1000 MG tablet Take 1,000 mg by mouth every evening.   vitamin E 400 UNIT capsule Take 400 Units by mouth daily.            Durable Medical Equipment  (From admission, onward)        Start     Ordered   08/11/17 1624  DME Walker rolling  Once    Question:  Patient needs a walker to treat with the following condition  Answer:  Total knee replacement status   08/11/17 1624   08/11/17 1624  DME Bedside commode  Once    Question:  Patient needs a bedside commode to treat with the following condition  Answer:  Total knee replacement status   08/11/17 1624      Diagnostic Studies: Dg Knee Left Port  Result Date: 08/11/2017 CLINICAL DATA:  Left total knee arthroplasty EXAM: PORTABLE LEFT KNEE - 1-2 VIEW COMPARISON:  None. FINDINGS: Status post left total knee arthroplasty, with well-positioned left distal femoral, left proximal tibial and left posterior patellar prostheses. No osseous fracture or dislocation. No suspicious focal osseous lesion. Surgical drain terminates in the region of the suprapatellar left knee joint. Expected soft tissue gas within and surrounding the left knee joint.  Midline skin staples overlie the left knee anteriorly. IMPRESSION: Satisfactory immediate postoperative appearance status post left total knee arthroplasty. Electronically Signed   By: Ilona Sorrel M.D.   On: 08/11/2017 15:44   [place] Soft Tissue Non Vascular  Result Date: 07/18/2017 CLINICAL DATA:  Left forearm mass. EXAM: ULTRASOUND LEFT UPPER EXTREMITY LIMITED TECHNIQUE: Ultrasound examination of the upper extremity soft tissues was performed in the area of clinical concern. COMPARISON:  None. FINDINGS: At the area of palpable concern in the left antecubital fossa, there is a oval, hypoechoic, 1.3 x 0.7 x 1.0 cm lesion in the superficial subcutaneous fat with distinct margins and no internal vascularity. IMPRESSION: 1. Oval, predominantly hypoechoic 1.3 cm mass in the superficial left antecubital fossa without internal vascularity. This is favored to represent a sebaceous cyst. Clinical follow-up is recommended. Electronically Signed   By: Titus Dubin M.D.   On: 07/18/2017 12:08    Disposition: 01-Home or Self Care  Discharge Instructions    Diet - low sodium heart healthy   Complete by:  As directed    Increase activity slowly   Complete by:  As directed       Follow-up Information    Duanne Guess, PA-C On 08/25/2017.   Specialties:  Orthopedic Surgery, Emergency Medicine Why:  at 9:45am Contact information: [place] [address] [phone]        Dereck Leep, MD On 09/23/2017.   Specialty:  Orthopedic Surgery Why:  at 10:45am Contact information: [place] [address] [phone]            Signed: Watt Climes 08/12/2017, 7:34 AM

## 2017-08-12 NOTE — Clinical Social Work Note (Signed)
Clinical Social Work Assessment  Patient Details  Name: DONNA SNOOKS MRN: [id number] Date of Birth: 01-27-1956  Date of referral:  08/12/17               Reason for consult:  Facility Placement                Permission sought to share information with:  Chartered certified accountant granted to share information::  Yes, Verbal Permission Granted  Name::      [place]::   [place]   Relationship::     Contact Information:     Housing/Transportation Living arrangements for the past 2 months:  [place] of Information:  Patient Patient Interpreter Needed:  None Criminal Activity/Legal Involvement Pertinent to Current Situation/Hospitalization:  No - Comment as needed Significant Relationships:  Adult Children Lives with:  Self Do you feel safe going back to the place where you live?  Yes Need for family participation in patient care:  Yes (Comment)  Care giving concerns:  Patient lives alone in [place].    Social Worker assessment / plan:  Holiday representative (CSW) received SNF consult. PT is recommending SNF. CSW met with patient alone at bedside to address consult. Patient was alert and oriented X4 and was sitting up in the chair at bedside. CSW introduced self and explained role of CSW department. Patient reported that she lives alone and has no HPOA. Patient requested assistance to create an advanced directive. RN put in chaplain consult. Per patient her daughter Aida Raider works at [employer] and is in [place]. CSW explained SNF process and that Mcarthur Rossetti will have to approve SNF. Patient verbalized her understanding and is agreeable to SNF search in [place]. Patient requested [facility] and prefers a private room however she is okay with a semi-private room if that is all that is available. FL2 complete and faxed out.   CSW presented bed offers to patient. She chose [facility]. Per [facility] admissions  coordinator at [facility] she will start [facility] authorization today. CSW will continue to follow and assist as needed.    Employment status:  Retired Nurse, adult PT Recommendations:  [place] / Referral to community resources:  [place]  Patient/Family's Response to care: Patient accepted bed offer from [facility].   Patient/Family's Understanding of and Emotional Response to Diagnosis, Current Treatment, and Prognosis:  Patient was very pleasant and thanked CSW for assistance.   Emotional Assessment Appearance:  Appears stated age Attitude/Demeanor/Rapport:    Affect (typically observed):  Accepting, Adaptable, Pleasant Orientation:  Oriented to Self, Oriented to Place, Oriented to  Time, Oriented to Situation Alcohol / Substance use:  Not Applicable Psych involvement (Current and /or in the community):  No (Comment)  Discharge Needs  Concerns to be addressed:  Discharge Planning Concerns Readmission within the last 30 days:  No Current discharge risk:  Dependent with Mobility Barriers to Discharge:  Continued Medical Work up   [employer], Veronia Beets, LCSW 08/12/2017, 11:05 AM

## 2017-08-12 NOTE — Clinical Social Work Placement (Signed)
   CLINICAL SOCIAL WORK PLACEMENT  NOTE  Date:  08/12/2017  Patient Details  Name: Christine Higgins MRN: [id number] Date of Birth: 1955/12/08  Clinical Social Work is seeking post-discharge placement for this patient at the [place] level of care (*CSW will initial, date and re-position this form in  chart as items are completed):  Yes   Patient/family provided with [facility] Work Department's list of facilities offering this level of care within the geographic area requested by the patient (or if unable, by the patient's family).  Yes   Patient/family informed of their freedom to choose among providers that offer the needed level of care, that participate in Medicare, Medicaid or managed care program needed by the patient, have an available bed and are willing to accept the patient.  Yes   Patient/family informed of [facility]'s ownership interest in [facility] and [facility], as well as of the fact that they are under no obligation to receive care at these facilities.  PASRR submitted to EDS on 08/11/17     PASRR number received on 08/11/17     Existing PASRR number confirmed on       FL2 transmitted to all facilities in geographic area requested by pt/family on 08/11/17     FL2 transmitted to all facilities within larger geographic area on       Patient informed that his/her managed care company has contracts with or will negotiate with certain facilities, including the following:        Yes   Patient/family informed of bed offers received.  Patient chooses bed at [facility] )     Physician recommends and patient chooses bed at      Patient to be transferred to   on  .  Patient to be transferred to facility by       Patient family notified on   of transfer.  Name of family member notified:        PHYSICIAN       Additional Comment:    _______________________________________________ Tyquez Hollibaugh, Veronia Beets,  LCSW 08/12/2017, 11:04 AM

## 2017-08-12 NOTE — Progress Notes (Signed)
OT Cancellation Note  Patient Details Name: Christine Higgins MRN: [id number] DOB: September 25, 1955   Cancelled Treatment:    Reason Eval/Treat Not Completed: Other (comment). Order received, chart reviewed. Pt working with PT upon initial attempt. Will re-attempt at later time.  Jeni Salles, MPH, MS, OTR/L ascom [phone] 08/12/17, 9:55 AM \

## 2017-08-12 NOTE — Progress Notes (Signed)
Per [facility] admissions coordinator at [facility] authorization has been received. Per Sharyn Lull patient can come to [facility] room 208-A, when medically stable. Patient is aware of above.   [employer], LCSW [phone]

## 2017-08-12 NOTE — Anesthesia Postprocedure Evaluation (Signed)
Anesthesia Post Note  Patient: Walker Shadow  Procedure(s) Performed: COMPUTER ASSISTED TOTAL KNEE ARTHROPLASTY (Left )  Patient location during evaluation: Nursing Unit Anesthesia Type: Spinal Level of consciousness: awake, awake and alert, oriented and patient cooperative Pain management: pain level controlled Vital Signs Assessment: post-procedure vital signs reviewed and stable Respiratory status: spontaneous breathing, nonlabored ventilation and respiratory function stable Cardiovascular status: stable Postop Assessment: no headache Anesthetic complications: no     Last Vitals:  Vitals:   08/12/17 0100 08/12/17 0421  BP: 129/76 (!) 107/55  Pulse: 85 (!) 102  Resp: 18 19  Temp: 36.6 C   SpO2: 99% 99%    Last Pain:  Vitals:   08/12/17 0537  TempSrc:   PainSc: Asleep                 Yehuda Printup,  [facility]

## 2017-08-12 NOTE — Progress Notes (Signed)
Physical Therapy Treatment Patient Details Name: Christine Higgins MRN: [id number] DOB: 1956/07/05 Today's Date: 08/12/2017    History of Present Illness 61 y/o female s/p L TKA 12/17.  h/o previous non-knee joint replacements.    PT Comments    Again coordinated with pain meds for best results but pt still very pain limited and needing a lot of encouragement, rest breaks and plenty to feed back.  Pt showed some increased quad control but is still very weak.  Pt motivated to continue working hard, but is very pain limited and needed excessive cuing t/o the session.     Follow Up Recommendations  SNF     Equipment Recommendations       Recommendations for Other Services       Precautions / Restrictions Precautions Precautions: Knee;Fall Required Braces or Orthoses: Knee Immobilizer - Left Restrictions LLE Weight Bearing: Weight bearing as tolerated    Mobility  Bed Mobility Overal bed mobility: Needs Assistance Bed Mobility: Supine to Sit     Supine to sit: Min assist     General bed mobility comments: very light assist to control L LE in KI to floor, pt able to do most of the actual mobility part  Transfers Overall transfer level: Needs assistance Equipment used: Rolling walker (2 wheeled) Transfers: Sit to/from Stand Sit to Stand: Min guard         General transfer comment: Pt needed reminders for set up and 2 attempts at rising, but was able to assume standing w/o direct assist.   Ambulation/Gait Ambulation/Gait assistance: Min assist Ambulation Distance (Feet): 45 Feet Assistive device: Rolling walker (2 wheeled)       General Gait Details: Pt still very hesitant and guarded, but did better than this AM.  She still felt some bucking inside the KI and at times struggled to advance L LE through.  Pt only able to manange step-to gait and was extremely reliant on the walker but ultimately did show improvement and acceptable POD1 ambulation distance.     Stairs            Wheelchair Mobility    Modified Rankin (Stroke Patients Only)       Balance Overall balance assessment: Modified Independent                                          Cognition Arousal/Alertness: Awake/alert Behavior During Therapy: WFL for tasks assessed/performed Overall Cognitive Status: Within Functional Limits for tasks assessed                                        Exercises Total Joint Exercises Ankle Circles/Pumps: Strengthening;10 reps Quad Sets: Strengthening;15 reps Gluteal Sets: Strengthening;15 reps Short Arc Quad: AROM;AAROM;10 reps Heel Slides: AAROM;5 reps Hip ABduction/ADduction: Strengthening;10 reps Straight Leg Raises: AAROM;10 reps Knee Flexion: PROM;5 reps    General Comments        Pertinent Vitals/Pain Pain Assessment: 0-10 Pain Score: 10-Worst pain ever Pain Location: L knee    Home Living                      Prior Function            PT Goals (current goals can now be found in the care plan section) Progress towards  PT goals: Progressing toward goals    Frequency    BID      PT Plan Current plan remains appropriate    Co-evaluation              AM-PAC PT "6 Clicks" Daily Activity  Outcome Measure  Difficulty turning over in bed (including adjusting bedclothes, sheets and blankets)?: A Little Difficulty moving from lying on back to sitting on the side of the bed? : Unable Difficulty sitting down on and standing up from a chair with arms (e.g., wheelchair, bedside commode, etc,.)?: A Lot Help needed moving to and from a bed to chair (including a wheelchair)?: A Lot Help needed walking in hospital room?: A Lot Help needed climbing 3-5 steps with a railing? : Total 6 Click Score: 11    End of Session Equipment Utilized During Treatment: Gait belt Activity Tolerance: Patient limited by fatigue;Patient limited by pain Patient left: with chair  alarm set;with call bell/phone within reach Nurse Communication: Mobility status;Patient requests pain meds PT Visit Diagnosis: Muscle weakness (generalized) (M62.81);Difficulty in walking, not elsewhere classified (R26.2);Pain Pain - Right/Left: Left Pain - part of body: Knee     Time: 1093-2355 PT Time Calculation (min) (ACUTE ONLY): 44 min  Charges:  $Gait Training: 8-22 mins $Therapeutic Exercise: 23-37 mins                    G Codes:       Kreg Shropshire, DPT 08/12/2017, 4:13 PM

## 2017-08-12 NOTE — Progress Notes (Signed)
[facility] responded to an OR for an AD. [place] educated the Pt on the document. Pt hopes to complete later today.    08/12/17 1200  Clinical Encounter Type  Visited With Patient  Visit Type Initial;Spiritual support  Referral From Nurse  Consult/Referral To Chaplain  Spiritual Encounters  Spiritual Needs Literature;Prayer

## 2017-08-12 NOTE — Progress Notes (Signed)
RN emptied pt's hemovac at shift change this am with 250 cc bloody output, and again around noon with 210cc output. Dr. Marry Guan notified, and received order to put hemovac to gravity instead of to suction. Done around 1500.   Christine Higgins, Jerry Caras

## 2017-08-12 NOTE — Evaluation (Signed)
Physical Therapy Evaluation Patient Details Name: Christine Higgins MRN: [id number] DOB: April 23, 1956 Today's Date: 08/12/2017   History of Present Illness  61 y/o female s/p L TKA 12/17.  h/o previous non-knee joint replacements.  Clinical Impression  Pt with severe pain with any activity involving the L knee.  PT was able to coordinate pain meds with nursing to be appropriate for PT exam but pt still c/o "10.5/10" pain with any real activity.  Pt struggled with quad control and though she showed great effort with all tasks was pain guarded/focused which made really working through typical POD1 session difficult.  She was very, very slow and guarded with ambulation and though she was eager to do more she simply could not push herself to do more than ~10 ft of walking.  Pt would not be safe at home and will clearly need rehab on d/c from the hospital as she is very weak, functionally limited and struggling with pain.      Follow Up Recommendations SNF    Equipment Recommendations  (TBD at rehab)    Recommendations for Other Services       Precautions / Restrictions Precautions Precautions: Knee;Fall Restrictions LLE Weight Bearing: Weight bearing as tolerated      Mobility  Bed Mobility Overal bed mobility: Needs Assistance Bed Mobility: Supine to Sit     Supine to sit: Min assist     General bed mobility comments: Pt able to do most of getting to EOB, very guarded/slow with heavy UE use to move L LE   Transfers Overall transfer level: Needs assistance   Transfers: Sit to/from Stand Sit to Stand: Min assist         General transfer comment: Pt needing extensive cuing for positioning and sequencing, but actually was able to rise w/o excessive assist.   Ambulation/Gait Ambulation/Gait assistance: Min assist Ambulation Distance (Feet): 11 Feet Assistive device: Rolling walker (2 wheeled)       General Gait Details: Pt is very, very hesitant with ambulation.  She was  excessively slow and guarded and unable to take more than 2-3 inch step and highly reliant on UEs/walker.  She c/o pain with any WBing and ultimatley was not able to tolerate much ambulation at all.   Stairs            Wheelchair Mobility    Modified Rankin (Stroke Patients Only)       Balance                                             Pertinent Vitals/Pain Pain Assessment: 0-10 Pain Score: 10-Worst pain ever    Home Living Family/patient expects to be discharged to:: Skilled nursing facility Living Arrangements: Alone                    Prior Function Level of Independence: Independent with assistive device(s)         Comments: Pt able to manage with [facility], has struggled more recently with b/l knee pain.     Hand Dominance        Extremity/Trunk Assessment   Upper Extremity Assessment Upper Extremity Assessment: Generalized weakness;Overall [facility] for tasks assessed    Lower Extremity Assessment Lower Extremity Assessment: Generalized weakness(L LE weaker than typical post-op expectations)       Communication   Communication: No difficulties  Cognition Arousal/Alertness:  Awake/alert Behavior During Therapy: WFL for tasks assessed/performed Overall Cognitive Status: Within Functional Limits for tasks assessed                                        General Comments      Exercises Total Joint Exercises Ankle Circles/Pumps: Strengthening;10 reps Quad Sets: Strengthening;10 reps Gluteal Sets: Strengthening;10 reps Short Arc Quad: AAROM;5 reps Heel Slides: AAROM;5 reps Hip ABduction/ADduction: AAROM;10 reps Straight Leg Raises: AAROM;10 reps Knee Flexion: PROM;5 reps Goniometric ROM: 2-63   Assessment/Plan    PT Assessment Patient needs continued PT services  PT Problem List Decreased strength;Decreased range of motion;Decreased activity tolerance;Decreased balance;Decreased mobility;Decreased  coordination;Decreased knowledge of use of DME;Decreased safety awareness;Pain;Decreased knowledge of precautions       PT Treatment Interventions DME instruction;Gait training;Stair training;Functional mobility training;Therapeutic activities;Therapeutic exercise;Balance training;Neuromuscular re-education;Patient/family education    PT Goals (Current goals can be found in the Care Plan section)  Acute Rehab PT Goals Patient Stated Goal: get pain under control, get back to walking well PT Goal Formulation: With patient Time For Goal Achievement: 08/26/17 Potential to Achieve Goals: Fair    Frequency BID   Barriers to discharge        Co-evaluation               AM-PAC PT "6 Clicks" Daily Activity  Outcome Measure Difficulty turning over in bed (including adjusting bedclothes, sheets and blankets)?: Unable Difficulty moving from lying on back to sitting on the side of the bed? : Unable Difficulty sitting down on and standing up from a chair with arms (e.g., wheelchair, bedside commode, etc,.)?: Unable Help needed moving to and from a bed to chair (including a wheelchair)?: A Lot Help needed walking in hospital room?: A Lot Help needed climbing 3-5 steps with a railing? : Total 6 Click Score: 8    End of Session Equipment Utilized During Treatment: Gait belt Activity Tolerance: Patient limited by fatigue;Patient limited by pain Patient left: with chair alarm set;with call bell/phone within reach Nurse Communication: Mobility status;Patient requests pain meds PT Visit Diagnosis: Muscle weakness (generalized) (M62.81);Difficulty in walking, not elsewhere classified (R26.2);Pain Pain - Right/Left: Left Pain - part of body: Knee    Time: 2979-8921 PT Time Calculation (min) (ACUTE ONLY): 50 min   Charges:   PT Evaluation $PT Eval Low Complexity: 1 Low PT Treatments $Gait Training: 8-22 mins $Therapeutic Exercise: 8-22 mins   PT G Codes:   PT G-Codes **NOT FOR  INPATIENT CLASS** Functional Assessment Tool Used: AM-PAC 6 Clicks Basic Mobility Functional Limitation: Mobility: Walking and moving around Mobility: Walking and Moving Around Current Status (J9417): At least 80 percent but less than 100 percent impaired, limited or restricted Mobility: Walking and Moving Around Goal Status [phone]): At least 20 percent but less than 40 percent impaired, limited or restricted    Kreg Shropshire, DPT 08/12/2017, 10:28 AM

## 2017-08-12 NOTE — Evaluation (Signed)
Occupational Therapy Evaluation Patient Details Name: Christine Higgins MRN: [id number] DOB: 1956/03/29 Today's Date: 08/12/2017    History of Present Illness 61 y/o female s/p L TKA 12/17.  h/o previous non-knee joint replacements.   Clinical Impression   Pt seen for OT evaluation this date. Pt fairly pain limited this session with mobility due to recent PT session. RN gave pain meds at start of OT session. Pt lives alone in a single family house with 6 steps to enter and was modified independent with short distances using SPC for mobility, required a scooter for grocery shopping, uses [employer] for transportation, and was unable to stand for more than 4-5 minutes at a time due to pain prior to admission. Pt required assist for cooking and cleaning from daughter's friend. Independent with toileting, sponge bathing, and dressing, although endorses increased difficulty with toilet and tub transfers due to pain. Pt endorses 2 falls in past 12 months. Pt currently presents with significant L knee pain in addition to chronic pain, decreased strength/ROM, activity tolerance, and knowledge of AE/DME. Extensive education provided to pt on AE/DME to improve safety and comfort with toilet and tub transfers, increase independence with LB dressing, polar care mgt, and maximize safety when entering/exiting her home with 6 steps to enter. Pt very thankful for education provided. Will continue to benefit from skilled OT services to address noted impairments and functional deficits in bathing, dressing, and toileting tasks in order to maximize return to PLOF with less pain, decreased falls risk, and improved independence. Pt currently unsafe to return home and will benefit from skilled OT 5x/wk in setting of STR following hospitalization upon discharge from hospital.    Follow Up Recommendations  SNF    Equipment Recommendations  Tub/shower bench;Toilet rise with handles    Recommendations for Other Services        Precautions / Restrictions Precautions Precautions: Knee;Fall Restrictions Weight Bearing Restrictions: Yes LLE Weight Bearing: Weight bearing as tolerated      Mobility Bed Mobility     General bed mobility comments: deferred, pt in recliner  Transfers         General transfer comment: pt declined due to pain after recent PT session    Balance                                           ADL either performed or assessed with clinical judgement   ADL Overall ADL's : Needs assistance/impaired Eating/Feeding: Independent   Grooming: Sitting;Set up   Upper Body Bathing: Sitting;Set up   Lower Body Bathing: Sit to/from stand;Moderate assistance   Upper Body Dressing : Sitting;Set up   Lower Body Dressing: Sit to/from stand;Minimal assistance;With adaptive equipment Lower Body Dressing Details (indicate cue type and reason): pt educated in LB dressing using AE  Toilet Transfer: Minimal assistance;BSC;RW Toilet Transfer Details (indicate cue type and reason): pt educated in use of BSC frame over commode at home to increase height, providing hand rails and maximize comfort and safety       Tub/Shower Transfer Details (indicate cue type and reason): pt educated in TTB to improve functional tub transfers at home          Vision Baseline Vision/History: Wears glasses Wears Glasses: Reading only Patient Visual Report: No change from baseline Vision Assessment?: No apparent visual deficits     Perception     Praxis  Pertinent Vitals/Pain Pain Assessment: 0-10 Pain Score: 9  Pain Location: L knee Pain Descriptors / Indicators: Aching;Guarding Pain Intervention(s): Limited activity within patient's tolerance;Monitored during session;RN gave pain meds during session;Ice applied     Hand Dominance Right   Extremity/Trunk Assessment Upper Extremity Assessment Upper Extremity Assessment: Generalized weakness(BUE grossly 4/5 - 4+/5)   Lower  Extremity Assessment Lower Extremity Assessment: Defer to PT evaluation   Cervical / Trunk Assessment Cervical / Trunk Assessment: Normal   Communication Communication Communication: No difficulties   Cognition Arousal/Alertness: Awake/alert Behavior During Therapy: WFL for tasks assessed/performed Overall Cognitive Status: Within Functional Limits for tasks assessed                                     General Comments       Exercises Other Exercises Other Exercises: pt educated in polar care mgt    Shoulder Instructions      Home Living Family/patient expects to be discharged to:: Skilled nursing facility Living Arrangements: Alone                                      Prior Functioning/Environment Level of Independence: Independent with assistive device(s);Needs assistance  Gait / Transfers Assistance Needed: Pt able to manage with SPC, has struggled more recently with b/l knee pain, making toilet and tub transfers more difficult ADL's / Homemaking Assistance Needed: indep with sponge bath, toileting, dressing tasks; daughter's friend has been assisting with cooking/cleaning; uses [employer] for transportation in community and uses scooters at the grocery store as she cannot tolerate standing more than 4-5 minutes at a time.   Comments: endorses x2 falls in past 12 months        OT Problem List: Decreased strength;Decreased knowledge of use of DME or AE;Decreased activity tolerance;Pain      OT Treatment/Interventions: Self-care/ADL training;Therapeutic exercise;DME and/or AE instruction;Patient/family education;Energy conservation    OT Goals(Current goals can be found in the care plan section) Acute Rehab OT Goals Patient Stated Goal: get pain under control, get back to walking well OT Goal Formulation: With patient Time For Goal Achievement: 08/26/17 Potential to Achieve Goals: Good ADL Goals Pt Will Perform Lower Body Dressing: with  set-up;with min guard assist;sit to/from stand;with adaptive equipment Pt Will Transfer to Toilet: with min guard assist;ambulating(RW for ambulation, comfort height toilet)  OT Frequency: Min 2X/week   Barriers to D/C:            Co-evaluation              AM-PAC PT "6 Clicks" Daily Activity     Outcome Measure Help from another person eating meals?: None Help from another person taking care of personal grooming?: A Little Help from another person toileting, which includes using toliet, bedpan, or urinal?: A Little Help from another person bathing (including washing, rinsing, drying)?: A Lot Help from another person to put on and taking off regular upper body clothing?: A Little Help from another person to put on and taking off regular lower body clothing?: A Little 6 Click Score: 18   End of Session    Activity Tolerance: Patient limited by pain Patient left: in chair;with call bell/phone within reach;with chair alarm set;with nursing/sitter in room  OT Visit Diagnosis: Other abnormalities of gait and mobility (R26.89);Pain Pain - Right/Left: Left Pain - part  of body: Knee                Time: 1007-1039 OT Time Calculation (min): 32 min Charges:  OT General Charges $OT Visit: 1 Visit OT Evaluation $OT Eval Low Complexity: 1 Low OT Treatments $Self Care/Home Management : 23-37 mins G-Codes: OT G-codes **NOT FOR INPATIENT CLASS** Functional Assessment Tool Used: AM-PAC 6 Clicks Daily Activity;Clinical judgement Functional Limitation: Self care Self Care Current Status (O8875): At least 40 percent but less than 60 percent impaired, limited or restricted Self Care Goal Status (Z9728): At least 20 percent but less than 40 percent impaired, limited or restricted   Jeni Salles, MPH, MS, OTR/L ascom [phone] 08/12/17, 11:12 AM

## 2017-08-13 ENCOUNTER — Other Ambulatory Visit: Payer: Self-pay

## 2017-08-13 DIAGNOSIS — M25662 Stiffness of left knee, not elsewhere classified: Secondary | ICD-10-CM | POA: Diagnosis not present

## 2017-08-13 DIAGNOSIS — Z96652 Presence of left artificial knee joint: Secondary | ICD-10-CM | POA: Diagnosis not present

## 2017-08-13 DIAGNOSIS — K219 Gastro-esophageal reflux disease without esophagitis: Secondary | ICD-10-CM | POA: Diagnosis not present

## 2017-08-13 DIAGNOSIS — M5137 Other intervertebral disc degeneration, lumbosacral region: Secondary | ICD-10-CM | POA: Diagnosis not present

## 2017-08-13 DIAGNOSIS — R262 Difficulty in walking, not elsewhere classified: Secondary | ICD-10-CM | POA: Diagnosis not present

## 2017-08-13 DIAGNOSIS — G8929 Other chronic pain: Secondary | ICD-10-CM | POA: Diagnosis not present

## 2017-08-13 DIAGNOSIS — E119 Type 2 diabetes mellitus without complications: Secondary | ICD-10-CM | POA: Diagnosis not present

## 2017-08-13 DIAGNOSIS — Z471 Aftercare following joint replacement surgery: Secondary | ICD-10-CM | POA: Diagnosis not present

## 2017-08-13 DIAGNOSIS — M25562 Pain in left knee: Secondary | ICD-10-CM | POA: Diagnosis not present

## 2017-08-13 DIAGNOSIS — M1712 Unilateral primary osteoarthritis, left knee: Secondary | ICD-10-CM | POA: Diagnosis not present

## 2017-08-13 DIAGNOSIS — M6281 Muscle weakness (generalized): Secondary | ICD-10-CM | POA: Diagnosis not present

## 2017-08-13 DIAGNOSIS — R000 Tachycardia, unspecified: Secondary | ICD-10-CM | POA: Diagnosis not present

## 2017-08-13 DIAGNOSIS — I10 Essential (primary) hypertension: Secondary | ICD-10-CM | POA: Diagnosis not present

## 2017-08-13 DIAGNOSIS — Z7401 Bed confinement status: Secondary | ICD-10-CM | POA: Diagnosis not present

## 2017-08-13 DIAGNOSIS — M797 Fibromyalgia: Secondary | ICD-10-CM | POA: Diagnosis not present

## 2017-08-13 MED ORDER — CLONAZEPAM 1 MG PO TABS
1.0000 mg | ORAL_TABLET | Freq: Every evening | ORAL | 0 refills | Status: DC | PRN
Start: 2017-08-13 — End: 2022-06-17

## 2017-08-13 MED ORDER — OXYCODONE HCL 5 MG PO TABS: 5.0000 mg | ORAL_TABLET | ORAL | 0 refills | Status: DC | PRN

## 2017-08-13 NOTE — Progress Notes (Signed)
Physical Therapy Treatment Patient Details Name: Christine Higgins MRN: [id number] DOB: 1956-07-12 Today's Date: 08/13/2017    History of Present Illness 61 y/o female s/p L TKA 12/17.  h/o previous non-knee joint replacements.    PT Comments    Pt continues to make progress with physical therapy on this date.  She requires assist for LLE with bed mobility in knee immobilizer due to pain and weakness. Assist required for LLE abduction when exiting on the left side. She demonstrates improved sequencing with transfers. Safe hand placement and only one attempt required to come to standing. KI donned for transfers. Pt walks approximately 46' in room with therapist. KI donned for ambulation. Step-to pattern with decreased weight shifting to LLE. Cues for reminder regarding proper sequencing with walker. Increase in pain with weight shifting. Pt is appropriate to discharge to SNF when medically indicated. Pt will benefit from PT services to address deficits in strength, balance, and mobility in order to return to full function at home.   Follow Up Recommendations  SNF     Equipment Recommendations       Recommendations for Other Services       Precautions / Restrictions Precautions Precautions: Knee;Fall Required Braces or Orthoses: Knee Immobilizer - Left Restrictions Weight Bearing Restrictions: Yes LLE Weight Bearing: Weight bearing as tolerated    Mobility  Bed Mobility Overal bed mobility: Needs Assistance Bed Mobility: Supine to Sit     Supine to sit: Min assist     General bed mobility comments:  She requires assist for LLE with bed mobility in knee immobilizer due to pain and weakness. Assist required for LLE abduction when exiting on the left side.  Transfers Overall transfer level: Needs assistance Equipment used: Rolling walker (2 wheeled) Transfers: Sit to/from Stand Sit to Stand: Min guard         General transfer comment: She demonstrates improved sequencing  with transfers. Safe hand placement and only one attempt required to come to standing. KI donned for transfers  Ambulation/Gait Ambulation/Gait assistance: Min assist Ambulation Distance (Feet): 30 Feet Assistive device: Rolling walker (2 wheeled)   Gait velocity: Decreased Gait velocity interpretation: <1.8 ft/sec, indicative of risk for recurrent falls General Gait Details:  Pt walks approximately 30' in room with therapist. KI donned for ambulation. Step-to pattern with decreased weight shifting to LLE. Cues for reminder regarding proper sequencing with walker. Increase in pain with weight shifting.    Stairs            Wheelchair Mobility    Modified Rankin (Stroke Patients Only)       Balance Overall balance assessment: Modified Independent                                          Cognition Arousal/Alertness: Awake/alert Behavior During Therapy: WFL for tasks assessed/performed Overall Cognitive Status: Within Functional Limits for tasks assessed                                        Exercises Total Joint Exercises Ankle Circles/Pumps: Both;10 reps Quad Sets: Both;10 reps Gluteal Sets: Both;10 reps Short Arc Quad: Left;10 reps Heel Slides: Left;10 reps Hip ABduction/ADduction: Left;10 reps Straight Leg Raises: Left;10 reps Goniometric ROM: -2 to 73 AAROM, pain limited in flexion    General  Comments        Pertinent Vitals/Pain Pain Assessment: 0-10 Pain Score: 9  Pain Location: L knee Pain Descriptors / Indicators: Aching Pain Intervention(s): Limited activity within patient's tolerance;Monitored during session    Home Living                      Prior Function            PT Goals (current goals can now be found in the care plan section) Acute Rehab PT Goals Patient Stated Goal: Return to prior function PT Goal Formulation: With patient Time For Goal Achievement: 08/26/17 Potential to Achieve Goals:  Fair Progress towards PT goals: Progressing toward goals    Frequency    BID      PT Plan Current plan remains appropriate    Co-evaluation              AM-PAC PT "6 Clicks" Daily Activity  Outcome Measure  Difficulty turning over in bed (including adjusting bedclothes, sheets and blankets)?: A Little Difficulty moving from lying on back to sitting on the side of the bed? : Unable Difficulty sitting down on and standing up from a chair with arms (e.g., wheelchair, bedside commode, etc,.)?: A Little Help needed moving to and from a bed to chair (including a wheelchair)?: A Little Help needed walking in hospital room?: A Lot Help needed climbing 3-5 steps with a railing? : Total 6 Click Score: 13    End of Session Equipment Utilized During Treatment: Gait belt Activity Tolerance: Patient limited by fatigue;Patient limited by pain Patient left: in bed;with nursing/sitter in room   PT Visit Diagnosis: Muscle weakness (generalized) (M62.81);Difficulty in walking, not elsewhere classified (R26.2);Pain Pain - Right/Left: Left Pain - part of body: Knee     Time: 1120-     Charges:  $Gait Training: 8-22 mins $Therapeutic Exercise: 8-22 mins                    G Codes:       Lyndel Safe Erik Burkett PT, DPT     Sharone Almond 08/13/2017, 1:29 PM

## 2017-08-13 NOTE — Progress Notes (Signed)
   Subjective: 2 Days Post-Op Procedure(s) (LRB): COMPUTER ASSISTED TOTAL KNEE ARTHROPLASTY (Left) Patient reports pain as 9 on 0-10 scale.   Patient is well, and has had no acute complaints or problems Patient did fair with physical therapy yesterday. Range of motion 0-63 Plan is to go Rehab after hospital stay. no nausea and no vomiting Patient denies any chest pains or shortness of breath. Objective: Vital signs in last 24 hours: Temp:  [97.6 F (36.4 C)-98.5 F (36.9 C)] 98.5 F (36.9 C) (12/19 0421) Pulse Rate:  [85-116] 116 (12/19 0421) Resp:  [18] 18 (12/18 0850) BP: (97-144)/(45-83) 144/76 (12/19 0421) SpO2:  [96 %-100 %] 96 % (12/19 0421) well approximated incision Heels are non tender and elevated off the bed using rolled towels Intake/Output from previous day: 12/18 0701 - 12/19 0700 In: 1688.3 [P.O.:1320; I.V.:268.3; IV Piggyback:100] Out: 1010 [Urine:750; Drains:260] Intake/Output this shift: Total I/O In: -  Out: 750 [Urine:750]  No results for input(s): HGB in the last 72 hours. No results for input(s): WBC, RBC, HCT, PLT in the last 72 hours. No results for input(s): NA, K, CL, CO2, BUN, CREATININE, GLUCOSE, CALCIUM in the last 72 hours. No results for input(s): LABPT, INR in the last 72 hours.  EXAM General - Patient is Alert, Appropriate and Oriented Extremity - Neurologically intact Neurovascular intact Sensation intact distally Intact pulses distally Dorsiflexion/Plantar flexion intact No cellulitis present Compartment soft Dressing - moderate drainage Motor Function - intact, moving foot and toes well on exam.    Past Medical History:  Diagnosis Date  . Breast mass    LEFT x 3 months per pt and palpated by physician  . Cervical disc disease   . Chronic pain syndrome   . Degenerative disc disease, lumbar   . Fibromyalgia   . Fibromyalgia   . GERD (gastroesophageal reflux disease)   . Glaucoma   . Graves disease   . Hemorrhoids   .  Hyperlipidemia   . Hypertension   . Thyroid disease     Assessment/Plan: 2 Days Post-Op Procedure(s) (LRB): COMPUTER ASSISTED TOTAL KNEE ARTHROPLASTY (Left) Active Problems:   S/P total knee arthroplasty  Estimated body mass index is 37.13 kg/m as calculated from the following:   Height as of this encounter: 5\' 11"  (1.803 m).   Weight as of this encounter: 120.7 kg (266 lb 3.2 oz). Up with therapy Discharge to SNF  Labs: None DVT Prophylaxis - Lovenox, Foot Pumps and TED hose Weight-Bearing as tolerated to left leg Hemovac was discontinued on today's visit. Tips of the Hemovac appeared to be intact. Please wash the operative leg  Patient needs to have a bowel movement prior to discharge Be sure TED stockings on both legs at discharge.  Jillyn Ledger. McCoole Oatman 08/13/2017, 6:54 AM

## 2017-08-13 NOTE — Care Management Important Message (Signed)
Important Message  Patient Details  Name: Christine Higgins MRN: [id number] Date of Birth: 11/12/1955   Medicare Important Message Given:  N/A - LOS <3 / Initial given by admissions    Jolly Mango, RN 08/13/2017, 9:36 AM

## 2017-08-13 NOTE — Clinical Social Work Note (Signed)
Patient to be d/c'ed today to [facility].  Patient and family agreeable to plans will transport via ems RN to call report.  Evette Cristal, MSW, [place]

## 2017-08-13 NOTE — Progress Notes (Signed)
Called EMS for transport. 

## 2017-08-13 NOTE — Clinical Social Work Placement (Signed)
   CLINICAL SOCIAL WORK PLACEMENT  NOTE  Date:  08/13/2017  Patient Details  Name: Christine Higgins MRN: [id number] Date of Birth: 09-21-1955  Clinical Social Work is seeking post-discharge placement for this patient at the [place] level of care (*CSW will initial, date and re-position this form in  chart as items are completed):  Yes   Patient/family provided with [place] Work Department's list of facilities offering this level of care within the geographic area requested by the patient (or if unable, by the patient's family).  Yes   Patient/family informed of their freedom to choose among providers that offer the needed level of care, that participate in Medicare, Medicaid or managed care program needed by the patient, have an available bed and are willing to accept the patient.  Yes   Patient/family informed of [place]'s ownership interest in [facility] and [facility], as well as of the fact that they are under no obligation to receive care at these facilities.  PASRR submitted to EDS on 08/11/17     PASRR number received on 08/11/17     Existing PASRR number confirmed on       FL2 transmitted to all facilities in geographic area requested by pt/family on 08/11/17     FL2 transmitted to all facilities within larger geographic area on       Patient informed that his/her managed care company has contracts with or will negotiate with certain facilities, including the following:        Yes   Patient/family informed of bed offers received.  Patient chooses bed at [facility] )     Physician recommends and patient chooses bed at      Patient to be transferred to [facility] on 08/13/17.  Patient to be transferred to facility by [facility]     Patient family notified on 08/13/17 of transfer.  Name of family member notified:  Patient to notify her family.     PHYSICIAN       Additional Comment:     _______________________________________________ Ross Ludwig, LCSWA 08/13/2017, 3:14 PM

## 2017-08-13 NOTE — Progress Notes (Signed)
Called report to [place], [place]. Answered all questions. Dressing changed

## 2017-08-13 NOTE — Telephone Encounter (Signed)
Rx sent to [facility] phone : [phone] , fax : [phone]  

## 2017-08-14 DIAGNOSIS — I10 Essential (primary) hypertension: Secondary | ICD-10-CM | POA: Diagnosis not present

## 2017-08-14 DIAGNOSIS — M1712 Unilateral primary osteoarthritis, left knee: Secondary | ICD-10-CM | POA: Diagnosis not present

## 2017-08-14 DIAGNOSIS — K219 Gastro-esophageal reflux disease without esophagitis: Secondary | ICD-10-CM | POA: Diagnosis not present

## 2017-08-14 DIAGNOSIS — E119 Type 2 diabetes mellitus without complications: Secondary | ICD-10-CM | POA: Diagnosis not present

## 2017-08-18 ENCOUNTER — Other Ambulatory Visit
Admission: RE | Admit: 2017-08-18 | Discharge: 2017-08-18 | Disposition: A | Discharge status: Home / Self Care | Payer: Medicare HMO | Source: Skilled Nursing Facility | Attending: Internal Medicine | Admitting: Internal Medicine

## 2017-08-18 DIAGNOSIS — R000 Tachycardia, unspecified: Secondary | ICD-10-CM | POA: Insufficient documentation

## 2017-08-18 LAB — COMPREHENSIVE METABOLIC PANEL
ALT: 23 U/L (ref 14–54)
AST: 27 U/L (ref 15–41)
Albumin: 3.5 g/dL (ref 3.5–5.0)
Alkaline Phosphatase: 110 U/L (ref 38–126)
Anion gap: 8 (ref 5–15)
BUN: 10 mg/dL (ref 6–20)
CO2: 28 mmol/L (ref 22–32)
Calcium: 9.1 mg/dL (ref 8.9–10.3)
Chloride: 97 mmol/L — ABNORMAL LOW (ref 101–111)
Creatinine, Ser: 0.83 mg/dL (ref 0.44–1.00)
GFR calc Af Amer: >60 mL/min (ref >60)
GFR calc non Af Amer: >60 mL/min (ref >60)
Glucose, Bld: 146 mg/dL — ABNORMAL HIGH (ref 65–99)
Potassium: 3.3 mmol/L — ABNORMAL LOW (ref 3.5–5.1)
Sodium: 133 mmol/L — ABNORMAL LOW (ref 135–145)
Total Bilirubin: 0.7 mg/dL (ref 0.3–1.2)
Total Protein: 7 g/dL (ref 6.5–8.1)

## 2017-08-18 LAB — URINALYSIS, COMPLETE (UACMP) WITH MICROSCOPIC
Bacteria, UA: NONE SEEN
Bilirubin Urine: NEGATIVE
Glucose, UA: NEGATIVE mg/dL
Hgb urine dipstick: NEGATIVE
Ketones, ur: NEGATIVE mg/dL
Leukocytes, UA: NEGATIVE
Nitrite: NEGATIVE
Protein, ur: NEGATIVE mg/dL
Specific Gravity, Urine: 1.002 — ABNORMAL LOW (ref 1.005–1.030)
pH: 7 (ref 5.0–8.0)

## 2017-08-18 LAB — CBC WITH DIFFERENTIAL/PLATELET
Basophils Absolute: 0 10*3/uL (ref 0–0.1)
Basophils Relative: 1 %
Eosinophils Absolute: 0.2 10*3/uL (ref 0–0.7)
Eosinophils Relative: 3 %
HCT: 24.4 % — ABNORMAL LOW (ref 35.0–47.0)
Hemoglobin: 8 g/dL — ABNORMAL LOW (ref 12.0–16.0)
Lymphocytes Relative: 16 %
Lymphs Abs: 1.1 10*3/uL (ref 1.0–3.6)
MCH: 27.8 pg (ref 26.0–34.0)
MCHC: 32.9 g/dL (ref 32.0–36.0)
MCV: 84.4 fL (ref 80.0–100.0)
Monocytes Absolute: 0.7 10*3/uL (ref 0.2–0.9)
Monocytes Relative: 11 %
Neutro Abs: 4.6 10*3/uL (ref 1.4–6.5)
Neutrophils Relative %: 69 %
Platelets: 262 10*3/uL (ref 150–440)
RBC: 2.89 MIL/uL — ABNORMAL LOW (ref 3.80–5.20)
RDW: 14.3 % (ref 11.5–14.5)
WBC: 6.6 10*3/uL (ref 3.6–11.0)

## 2017-08-18 LAB — T4, FREE: Free T4: 0.83 ng/dL (ref 0.61–1.12)

## 2017-08-18 LAB — TSH: TSH: 5.274 u[IU]/mL — ABNORMAL HIGH (ref 0.350–4.500)

## 2017-08-20 LAB — URINE CULTURE: Culture: NO GROWTH

## 2017-08-25 DIAGNOSIS — Z96652 Presence of left artificial knee joint: Secondary | ICD-10-CM | POA: Diagnosis not present

## 2017-08-25 DIAGNOSIS — M25662 Stiffness of left knee, not elsewhere classified: Secondary | ICD-10-CM | POA: Diagnosis not present

## 2017-08-25 DIAGNOSIS — M6281 Muscle weakness (generalized): Secondary | ICD-10-CM | POA: Diagnosis not present

## 2017-08-25 DIAGNOSIS — M25562 Pain in left knee: Secondary | ICD-10-CM | POA: Diagnosis not present

## 2017-08-29 ENCOUNTER — Other Ambulatory Visit: Payer: Self-pay

## 2017-08-29 DIAGNOSIS — M25562 Pain in left knee: Secondary | ICD-10-CM | POA: Diagnosis not present

## 2017-08-29 DIAGNOSIS — Z96652 Presence of left artificial knee joint: Secondary | ICD-10-CM | POA: Diagnosis not present

## 2017-08-29 NOTE — Patient Outreach (Signed)
[facility]) Care Management  08/29/2017  Christine Higgins 09/29/1955 [id number]   Referral Date: 08/29/17 Referral Source: [employer] Date of Admission: 08/13/18 Diagnosis: Left knee replacement Date of Discharge: 08/25/2018 Facility: [place]: [facility]  Outreach attempt # 1 spoke with patient and she is able to verify HIPAA.  She reports she is doing good since being from the rehab facility but has not been able to be out walking as much as she likes due to the weather.  Patient reports she is going to therapy.    Social: Patient reports she is living her sister at this time and has family support.    Conditions: Patient had total knee replacement on 08-11-18.  Patient also has history HTN, GERD, and DM-2.    Medications: Patient refused to individually review medications but denies any problems.    Appointments: Patient reports she has seen the orthopedic already but has not seen her primary care physician. Advised patient that she will need to follow up with her primary care physician.  She verbalized understanding.  Patient uses [employer] for transportation.    Consent: RN CM reviewed [facility] services with patient. Patient states she does not feel that is necessary at this time.  Plan: RN CM will close case and notify care management assistant of case status. RN CM will send letter and brochure for future reference.     Jone Baseman, RN, MSN [facility] Care Management Care Management Coordinator Direct Line [phone] Toll Free: [phone]  Fax: [phone]

## 2017-09-01 DIAGNOSIS — Z96652 Presence of left artificial knee joint: Secondary | ICD-10-CM | POA: Diagnosis not present

## 2017-09-01 DIAGNOSIS — M25562 Pain in left knee: Secondary | ICD-10-CM | POA: Diagnosis not present

## 2017-09-03 DIAGNOSIS — M25562 Pain in left knee: Secondary | ICD-10-CM | POA: Diagnosis not present

## 2017-09-03 DIAGNOSIS — Z96652 Presence of left artificial knee joint: Secondary | ICD-10-CM | POA: Diagnosis not present

## 2017-09-09 DIAGNOSIS — Z96652 Presence of left artificial knee joint: Secondary | ICD-10-CM | POA: Diagnosis not present

## 2017-09-09 DIAGNOSIS — M25562 Pain in left knee: Secondary | ICD-10-CM | POA: Diagnosis not present

## 2017-09-12 DIAGNOSIS — Z96652 Presence of left artificial knee joint: Secondary | ICD-10-CM | POA: Diagnosis not present

## 2017-09-12 DIAGNOSIS — M25562 Pain in left knee: Secondary | ICD-10-CM | POA: Diagnosis not present

## 2017-09-16 DIAGNOSIS — Z96652 Presence of left artificial knee joint: Secondary | ICD-10-CM | POA: Diagnosis not present

## 2017-09-18 DIAGNOSIS — Z96652 Presence of left artificial knee joint: Secondary | ICD-10-CM | POA: Diagnosis not present

## 2017-09-23 DIAGNOSIS — Z96652 Presence of left artificial knee joint: Secondary | ICD-10-CM | POA: Diagnosis not present

## 2017-09-24 DIAGNOSIS — Z96652 Presence of left artificial knee joint: Secondary | ICD-10-CM | POA: Diagnosis not present

## 2017-09-26 DIAGNOSIS — Z96652 Presence of left artificial knee joint: Secondary | ICD-10-CM | POA: Diagnosis not present

## 2017-09-26 DIAGNOSIS — M25562 Pain in left knee: Secondary | ICD-10-CM | POA: Diagnosis not present

## 2017-09-30 DIAGNOSIS — M25562 Pain in left knee: Secondary | ICD-10-CM | POA: Diagnosis not present

## 2017-09-30 DIAGNOSIS — B354 Tinea corporis: Secondary | ICD-10-CM | POA: Diagnosis not present

## 2017-09-30 DIAGNOSIS — Z96652 Presence of left artificial knee joint: Secondary | ICD-10-CM | POA: Diagnosis not present

## 2017-09-30 DIAGNOSIS — J40 Bronchitis, not specified as acute or chronic: Secondary | ICD-10-CM | POA: Diagnosis not present

## 2017-10-01 ENCOUNTER — Other Ambulatory Visit: Payer: Medicare HMO | Admitting: Urology

## 2017-10-03 DIAGNOSIS — E119 Type 2 diabetes mellitus without complications: Secondary | ICD-10-CM | POA: Diagnosis not present

## 2017-10-03 DIAGNOSIS — F325 Major depressive disorder, single episode, in full remission: Secondary | ICD-10-CM | POA: Diagnosis not present

## 2017-10-03 DIAGNOSIS — M25562 Pain in left knee: Secondary | ICD-10-CM | POA: Diagnosis not present

## 2017-10-03 DIAGNOSIS — Z96652 Presence of left artificial knee joint: Secondary | ICD-10-CM | POA: Diagnosis not present

## 2017-10-03 DIAGNOSIS — I10 Essential (primary) hypertension: Secondary | ICD-10-CM | POA: Diagnosis not present

## 2017-10-07 ENCOUNTER — Other Ambulatory Visit: Payer: Medicare HMO | Admitting: Urology

## 2017-10-09 ENCOUNTER — Other Ambulatory Visit: Payer: Medicare HMO | Admitting: Urology

## 2017-10-13 DIAGNOSIS — I10 Essential (primary) hypertension: Secondary | ICD-10-CM | POA: Diagnosis not present

## 2017-10-13 DIAGNOSIS — E7800 Pure hypercholesterolemia, unspecified: Secondary | ICD-10-CM | POA: Diagnosis not present

## 2017-10-13 DIAGNOSIS — Z6835 Body mass index (BMI) 35.0-35.9, adult: Secondary | ICD-10-CM | POA: Diagnosis not present

## 2017-10-13 DIAGNOSIS — E119 Type 2 diabetes mellitus without complications: Secondary | ICD-10-CM | POA: Diagnosis not present

## 2017-10-13 DIAGNOSIS — F325 Major depressive disorder, single episode, in full remission: Secondary | ICD-10-CM | POA: Diagnosis not present

## 2017-10-13 DIAGNOSIS — I700 Atherosclerosis of aorta: Secondary | ICD-10-CM | POA: Diagnosis not present

## 2017-10-16 DIAGNOSIS — Z96652 Presence of left artificial knee joint: Secondary | ICD-10-CM | POA: Diagnosis not present

## 2017-10-16 DIAGNOSIS — M25562 Pain in left knee: Secondary | ICD-10-CM | POA: Diagnosis not present

## 2017-10-22 ENCOUNTER — Encounter: Payer: Self-pay | Admitting: Urology

## 2017-10-22 ENCOUNTER — Ambulatory Visit (INDEPENDENT_AMBULATORY_CARE_PROVIDER_SITE_OTHER): Payer: Medicare HMO | Admitting: Urology

## 2017-10-22 VITALS — BP 155/87 | HR 94 | Ht 71.0 in | Wt 260.0 lb

## 2017-10-22 DIAGNOSIS — R300 Dysuria: Secondary | ICD-10-CM

## 2017-10-22 DIAGNOSIS — N3941 Urge incontinence: Secondary | ICD-10-CM

## 2017-10-22 MED ORDER — SOLIFENACIN SUCCINATE 10 MG PO TABS: 10.0000 mg | ORAL_TABLET | Freq: Every day | ORAL | 6 refills | Status: DC

## 2017-10-22 MED ORDER — LIDOCAINE HCL 2 % EX GEL
1.0000 "application " | Freq: Once | CUTANEOUS | Status: AC
  Administered 2017-10-22: 1 via URETHRAL

## 2017-10-22 MED ORDER — CIPROFLOXACIN HCL 500 MG PO TABS
500.0000 mg | ORAL_TABLET | Freq: Once | ORAL | Status: AC
  Administered 2017-10-22: 500 mg via ORAL

## 2017-10-22 NOTE — Progress Notes (Signed)
   10/22/17  CC:  Chief Complaint  Patient presents with  . Cysto    HPI: 62 year old female previously seen for frequency, urgency, urge incontinence and "bladder pain".  She was started on Vesicare and noted significant improvement in her symptoms.  She recently ran out and her symptoms have returned.  Blood pressure (!) 155/87, pulse 94, height 5\' 11"  (1.803 m), weight 260 lb (117.9 kg), SpO2 98 %. NED. A&Ox3.   No respiratory distress   Abd soft, NT, ND Normal external genitalia with patent urethral meatus  Cystoscopy Procedure Note  Patient identification was confirmed, informed consent was obtained, and patient was prepped using Betadine solution.  Lidocaine jelly was administered per urethral meatus.    Preoperative abx where received prior to procedure.    Procedure: - Flexible cystoscope introduced, without any difficulty.   - Thorough search of the bladder revealed:    normal urethral meatus    normal urothelium    no stones    no ulcers     no tumors    no urethral polyps    no trabeculation  - Ureteral orifices were normal in position and appearance.  Post-Procedure: - Patient tolerated the procedure well  Assessment/ Plan: No significant abnormalities identified on cystoscopy.  Her Vesicare was refilled.  Follow-up 6 months.  If symptoms worsen on Vesicare will give a trial of Myrbetriq.   Abbie Sons, MD

## 2017-10-23 LAB — URINALYSIS, COMPLETE
Bilirubin, UA: NEGATIVE
Glucose, UA: NEGATIVE
Ketones, UA: NEGATIVE
Leukocytes, UA: NEGATIVE
Nitrite, UA: NEGATIVE
Protein, UA: NEGATIVE
RBC, UA: NEGATIVE
Specific Gravity, UA: 1.015 (ref 1.005–1.030)
Urobilinogen, Ur: 0.2 mg/dL (ref 0.2–1.0)
pH, UA: 7.5 (ref 5.0–7.5)

## 2017-10-23 LAB — MICROSCOPIC EXAMINATION
Bacteria, UA: NONE SEEN
RBC, UA: NONE SEEN /hpf (ref 0–?)
WBC, UA: NONE SEEN /hpf (ref 0–?)

## 2017-10-28 DIAGNOSIS — E119 Type 2 diabetes mellitus without complications: Secondary | ICD-10-CM | POA: Diagnosis not present

## 2017-10-28 DIAGNOSIS — E0500 Thyrotoxicosis with diffuse goiter without thyrotoxic crisis or storm: Secondary | ICD-10-CM | POA: Diagnosis not present

## 2017-10-28 DIAGNOSIS — N390 Urinary tract infection, site not specified: Secondary | ICD-10-CM | POA: Diagnosis not present

## 2017-10-28 DIAGNOSIS — N760 Acute vaginitis: Secondary | ICD-10-CM | POA: Diagnosis not present

## 2017-10-30 DIAGNOSIS — R21 Rash and other nonspecific skin eruption: Secondary | ICD-10-CM | POA: Diagnosis not present

## 2017-10-30 DIAGNOSIS — B351 Tinea unguium: Secondary | ICD-10-CM | POA: Diagnosis not present

## 2017-10-30 DIAGNOSIS — B354 Tinea corporis: Secondary | ICD-10-CM | POA: Diagnosis not present

## 2017-11-04 DIAGNOSIS — E042 Nontoxic multinodular goiter: Secondary | ICD-10-CM | POA: Diagnosis not present

## 2017-11-04 DIAGNOSIS — E0500 Thyrotoxicosis with diffuse goiter without thyrotoxic crisis or storm: Secondary | ICD-10-CM | POA: Diagnosis not present

## 2017-11-06 DIAGNOSIS — M1711 Unilateral primary osteoarthritis, right knee: Secondary | ICD-10-CM | POA: Diagnosis not present

## 2017-11-06 DIAGNOSIS — M25561 Pain in right knee: Secondary | ICD-10-CM | POA: Diagnosis not present

## 2017-11-12 DIAGNOSIS — M48062 Spinal stenosis, lumbar region with neurogenic claudication: Secondary | ICD-10-CM | POA: Diagnosis not present

## 2017-11-12 DIAGNOSIS — M9983 Other biomechanical lesions of lumbar region: Secondary | ICD-10-CM | POA: Diagnosis not present

## 2017-12-04 DIAGNOSIS — G8929 Other chronic pain: Secondary | ICD-10-CM | POA: Diagnosis not present

## 2017-12-04 DIAGNOSIS — M25512 Pain in left shoulder: Secondary | ICD-10-CM | POA: Diagnosis not present

## 2017-12-08 DIAGNOSIS — Z79899 Other long term (current) drug therapy: Secondary | ICD-10-CM | POA: Diagnosis not present

## 2017-12-08 DIAGNOSIS — M48062 Spinal stenosis, lumbar region with neurogenic claudication: Secondary | ICD-10-CM | POA: Diagnosis not present

## 2017-12-08 DIAGNOSIS — M9983 Other biomechanical lesions of lumbar region: Secondary | ICD-10-CM | POA: Diagnosis not present

## 2017-12-08 DIAGNOSIS — R32 Unspecified urinary incontinence: Secondary | ICD-10-CM | POA: Diagnosis not present

## 2017-12-08 DIAGNOSIS — F411 Generalized anxiety disorder: Secondary | ICD-10-CM | POA: Diagnosis not present

## 2017-12-18 DIAGNOSIS — B356 Tinea cruris: Secondary | ICD-10-CM | POA: Diagnosis not present

## 2017-12-18 DIAGNOSIS — L304 Erythema intertrigo: Secondary | ICD-10-CM | POA: Diagnosis not present

## 2018-01-02 DIAGNOSIS — Z6835 Body mass index (BMI) 35.0-35.9, adult: Secondary | ICD-10-CM | POA: Diagnosis not present

## 2018-01-02 DIAGNOSIS — I10 Essential (primary) hypertension: Secondary | ICD-10-CM | POA: Diagnosis not present

## 2018-01-02 DIAGNOSIS — M47816 Spondylosis without myelopathy or radiculopathy, lumbar region: Secondary | ICD-10-CM | POA: Diagnosis not present

## 2018-01-08 DIAGNOSIS — L918 Other hypertrophic disorders of the skin: Secondary | ICD-10-CM | POA: Diagnosis not present

## 2018-01-08 DIAGNOSIS — L814 Other melanin hyperpigmentation: Secondary | ICD-10-CM | POA: Diagnosis not present

## 2018-01-08 DIAGNOSIS — L280 Lichen simplex chronicus: Secondary | ICD-10-CM | POA: Diagnosis not present

## 2018-01-08 DIAGNOSIS — L089 Local infection of the skin and subcutaneous tissue, unspecified: Secondary | ICD-10-CM | POA: Diagnosis not present

## 2018-01-08 DIAGNOSIS — R21 Rash and other nonspecific skin eruption: Secondary | ICD-10-CM | POA: Diagnosis not present

## 2018-01-08 DIAGNOSIS — D485 Neoplasm of uncertain behavior of skin: Secondary | ICD-10-CM | POA: Diagnosis not present

## 2018-01-08 DIAGNOSIS — D2372 Other benign neoplasm of skin of left lower limb, including hip: Secondary | ICD-10-CM | POA: Diagnosis not present

## 2018-01-08 DIAGNOSIS — L304 Erythema intertrigo: Secondary | ICD-10-CM | POA: Diagnosis not present

## 2018-01-15 DIAGNOSIS — M5416 Radiculopathy, lumbar region: Secondary | ICD-10-CM | POA: Diagnosis not present

## 2018-01-15 DIAGNOSIS — M1711 Unilateral primary osteoarthritis, right knee: Secondary | ICD-10-CM | POA: Diagnosis not present

## 2018-01-29 DIAGNOSIS — L810 Postinflammatory hyperpigmentation: Secondary | ICD-10-CM | POA: Diagnosis not present

## 2018-01-29 DIAGNOSIS — M1711 Unilateral primary osteoarthritis, right knee: Secondary | ICD-10-CM | POA: Diagnosis not present

## 2018-01-29 DIAGNOSIS — L304 Erythema intertrigo: Secondary | ICD-10-CM | POA: Diagnosis not present

## 2018-01-29 DIAGNOSIS — E119 Type 2 diabetes mellitus without complications: Secondary | ICD-10-CM | POA: Diagnosis not present

## 2018-01-29 DIAGNOSIS — L280 Lichen simplex chronicus: Secondary | ICD-10-CM | POA: Diagnosis not present

## 2018-01-29 DIAGNOSIS — M25461 Effusion, right knee: Secondary | ICD-10-CM | POA: Diagnosis not present

## 2018-01-29 DIAGNOSIS — Z6835 Body mass index (BMI) 35.0-35.9, adult: Secondary | ICD-10-CM | POA: Diagnosis not present

## 2018-02-04 ENCOUNTER — Emergency Department: Payer: Medicare HMO

## 2018-02-04 ENCOUNTER — Emergency Department
Admission: EM | Admit: 2018-02-04 | Discharge: 2018-02-04 | Disposition: A | Discharge status: Home / Self Care | Payer: Medicare HMO | Attending: Emergency Medicine | Admitting: Emergency Medicine

## 2018-02-04 ENCOUNTER — Other Ambulatory Visit: Payer: Self-pay

## 2018-02-04 DIAGNOSIS — M542 Cervicalgia: Secondary | ICD-10-CM | POA: Insufficient documentation

## 2018-02-04 DIAGNOSIS — Z96652 Presence of left artificial knee joint: Secondary | ICD-10-CM | POA: Diagnosis not present

## 2018-02-04 DIAGNOSIS — E119 Type 2 diabetes mellitus without complications: Secondary | ICD-10-CM | POA: Diagnosis not present

## 2018-02-04 DIAGNOSIS — Z79899 Other long term (current) drug therapy: Secondary | ICD-10-CM | POA: Insufficient documentation

## 2018-02-04 DIAGNOSIS — W19XXXA Unspecified fall, initial encounter: Secondary | ICD-10-CM | POA: Insufficient documentation

## 2018-02-04 DIAGNOSIS — Z87891 Personal history of nicotine dependence: Secondary | ICD-10-CM | POA: Insufficient documentation

## 2018-02-04 DIAGNOSIS — Z96641 Presence of right artificial hip joint: Secondary | ICD-10-CM | POA: Insufficient documentation

## 2018-02-04 DIAGNOSIS — M25512 Pain in left shoulder: Secondary | ICD-10-CM | POA: Diagnosis not present

## 2018-02-04 DIAGNOSIS — I10 Essential (primary) hypertension: Secondary | ICD-10-CM | POA: Diagnosis not present

## 2018-02-04 DIAGNOSIS — Y998 Other external cause status: Secondary | ICD-10-CM | POA: Insufficient documentation

## 2018-02-04 DIAGNOSIS — R202 Paresthesia of skin: Secondary | ICD-10-CM | POA: Diagnosis not present

## 2018-02-04 DIAGNOSIS — S4992XA Unspecified injury of left shoulder and upper arm, initial encounter: Secondary | ICD-10-CM | POA: Diagnosis not present

## 2018-02-04 DIAGNOSIS — Y929 Unspecified place or not applicable: Secondary | ICD-10-CM | POA: Insufficient documentation

## 2018-02-04 DIAGNOSIS — Y9389 Activity, other specified: Secondary | ICD-10-CM | POA: Insufficient documentation

## 2018-02-04 DIAGNOSIS — Z6835 Body mass index (BMI) 35.0-35.9, adult: Secondary | ICD-10-CM | POA: Diagnosis not present

## 2018-02-04 DIAGNOSIS — M47816 Spondylosis without myelopathy or radiculopathy, lumbar region: Secondary | ICD-10-CM | POA: Diagnosis not present

## 2018-02-04 DIAGNOSIS — M79602 Pain in left arm: Secondary | ICD-10-CM | POA: Diagnosis not present

## 2018-02-04 DIAGNOSIS — E079 Disorder of thyroid, unspecified: Secondary | ICD-10-CM | POA: Diagnosis not present

## 2018-02-04 MED ORDER — METHYLPREDNISOLONE SODIUM SUCC 125 MG IJ SOLR
125.0000 mg | Freq: Once | INTRAMUSCULAR | Status: AC
  Administered 2018-02-04: 125 mg via INTRAMUSCULAR
  Filled 2018-02-04: qty 2

## 2018-02-04 MED ORDER — PREDNISONE 50 MG PO TABS: ORAL_TABLET | ORAL | 0 refills | Status: DC

## 2018-02-04 NOTE — ED Notes (Signed)
Pt reports she was visiting her brother yesterday and missed a step while walking causing her to fall. Pt states she used her left arm to break her fall and has had progressive pain that is radiating from her shoulder down her arm.

## 2018-02-04 NOTE — Progress Notes (Signed)
Chaplain received PG to patient's room and observed that the patient was tearful. Her presenting and immediate need was for prayer.  Chaplain talked with patient to get an understanding of what had brought her to the ED and what was happening in her life.  Patient indicated that her 63 year old daughter recently completed nursing school, has two children (ages 32 and 76), and she is trying to help her care for them while she prepares for nursing board exam.  Patient indicated that she is feeling completely burned out, but cannot tell her daughter because she doesn't want to let her down and she needs her at this time.  Patient acknowledged that she needs to take better care of herself if she wants to live. Chaplain provided spiritual and emotional support, including prayer, which physician joined in.  Chaplain talked with patient about how to better manage the stress including, but not limited to self-care, talking with daughter to make adjustments, and counseling.  Patient acknowledged that she felt lighter and more at peace after the visit and that she plans to make changes to take better care of herself while caring for others.      02/04/18 1903  Clinical Encounter Type  Visited With Patient  Visit Type Initial;Spiritual support  Referral From Nurse  Spiritual Encounters  Spiritual Needs Prayer;Emotional  Stress Factors  Patient Stress Factors Exhausted;Financial concerns

## 2018-02-04 NOTE — ED Provider Notes (Signed)
[facility] Emergency Department Provider Note  ____________________________________________  Time seen: Approximately 8:01 PM  I have reviewed the triage vital signs and the nursing notes.   HISTORY  Chief Complaint Shoulder Injury    HPI Christine Higgins is a 62 y.o. female with a history of fibromyalgia, presents to the emergency department with left sided neck and shoulder pain after a fall that occurred yesterday.  Patient reports that she has had tingling of the left upper extremity but no weakness or changes in sensation.  Patient is tearful in exam room in chaplain came to speak with patient.  She denies suicidal or homicidal ideation.  No skin compromise.  Patient has been taking ibuprofen despite allergy noted in triage.  Patient reports that NSAIDs are contraindicated for her due to aortic aneurysm.   Past Medical History:  Diagnosis Date  . Breast mass    LEFT x 3 months per pt and palpated by physician  . Cervical disc disease   . Chronic pain syndrome   . Degenerative disc disease, lumbar   . Fibromyalgia   . Fibromyalgia   . GERD (gastroesophageal reflux disease)   . Glaucoma   . Graves disease   . Hemorrhoids   . Hyperlipidemia   . Hypertension   . Thyroid disease     Patient Active Problem List   Diagnosis Date Noted  . S/P total knee arthroplasty 08/11/2017  . Chronic pain syndrome 07/30/2017  . Fibromyalgia 07/30/2017  . Pelvic pain in female 06/24/2017  . Dysuria 06/24/2017  . Right carpal tunnel syndrome 03/27/2017  . HTN, goal below 140/90 03/05/2017  . Graves' orbitopathy 02/17/2017  . Primary osteoarthritis of right knee 10/18/2016  . Primary osteoarthritis of left knee 10/18/2016  . Bilateral arm weakness 07/16/2016  . Depression, major, in remission ([place]) 12/21/2015  . Back pain at L4-L5 level 10/30/2015  . DDD (degenerative disc disease), lumbosacral 10/30/2015  . Status post total replacement of right hip 08/21/2015   . Leg pain 07/28/2015  . Primary osteoarthritis of both knees 07/28/2015  . Health care maintenance 06/21/2015  . Impingement syndrome of shoulder, left 10/20/2014  . Severe obesity (BMI 35.0-35.9 with comorbidity) ([place]) 08/09/2014  . Atherosclerosis of abdominal aorta ([place]) 07/29/2014  . DM II (diabetes mellitus, type II), controlled ([place]) 04/19/2014  . Hemorrhoids 03/14/2014  . Osteoporosis 02/03/2014  . Migraines 02/03/2014  . Hyperlipidemia, unspecified 02/03/2014  . GERD (gastroesophageal reflux disease) 02/03/2014    Past Surgical History:  Procedure Laterality Date  . BACK SURGERY     sumbar  . carpel tunn Right   . carpel tunnel Left   . CESAREAN SECTION    . COLONOSCOPY    . ESOPHAGOGASTRODUODENOSCOPY (EGD) WITH PROPOFOL N/A 06/26/2017   Procedure: ESOPHAGOGASTRODUODENOSCOPY (EGD) WITH PROPOFOL;  Surgeon: Lollie Sails, MD;  Location: [facility] ENDOSCOPY;  Service: Endoscopy;  Laterality: N/A;  . EXCISION MORTON'S NEUROMA Left 02/05/2017   Procedure: EXCISION MORTON'S NEUROMA;  Surgeon: Samara Deist, DPM;  Location: [place];  Service: Podiatry;  Laterality: Left;  iva with local  . HAND SURGERY Right    scar tissue removal  . JOINT REPLACEMENT     right hip arthroplasty 08/25/15  . KNEE ARTHROPLASTY Left 08/11/2017   Procedure: COMPUTER ASSISTED TOTAL KNEE ARTHROPLASTY;  Surgeon: Dereck Leep, MD;  Location: [facility] ORS;  Service: Orthopedics;  Laterality: Left;  . NECK SURGERY     "disk implant"  . SHOULDER SURGERY Right    spur removal  .  TOTAL HIP ARTHROPLASTY Right     Prior to Admission medications   Medication Sig Start Date End Date Taking? Authorizing Provider  acetaminophen (TYLENOL) 650 MG CR tablet Take 1,300 mg by mouth 2 (two) times daily as needed for pain.    [provider]  Ascorbic Acid (VITAMIN C) 1000 MG tablet Take 1,000 mg by mouth every evening.     [provider]  atorvastatin (LIPITOR) 10 MG tablet Take 10  mg by mouth daily after supper.     [provider]  Benzocaine-Calamine-Camphor (BAND-AID CALAMINE SPRAY EX) Apply 1 application topically 3 (three) times daily as needed (for pain).    [provider]  calcium carbonate (OS-CAL - DOSED IN MG OF ELEMENTAL CALCIUM) 1250 (500 Ca) MG tablet Take 1 tablet by mouth every evening.    [provider]  celecoxib (CELEBREX) 200 MG capsule Take 200 mg by mouth 2 (two) times daily.    [provider]  cholecalciferol (VITAMIN D) 1000 units tablet Take 1,000 Units by mouth every evening.    [provider]  ciclopirox (PENLAC) 8 % solution Apply 1 application topically 2 (two) times daily as needed (for nail fungus). Apply over nail and surrounding skin. Apply daily over previous coat. After seven (7) days, may remove with alcohol and continue cycle.    [provider]  clobetasol (TEMOVATE) 0.05 % external solution Apply 1 application topically 2 (two) times daily as needed (for dry scalp).    [provider]  clonazePAM (KLONOPIN) 1 MG tablet Take 1 tablet (1 mg total) by mouth at bedtime as needed (for sleep/anxiety). 08/13/17   Toni Arthurs, NP  clotrimazole-betamethasone (LOTRISONE) cream Apply 1 application topically 2 (two) times daily as needed (for irritated skin).     [provider]  diphenhydrAMINE (BENADRYL) 25 MG tablet Take 25 mg by mouth every 6 (six) hours as needed (for allergies.).     [provider]  DULoxetine (CYMBALTA) 30 MG capsule Take 30 mg by mouth every evening. Total dose=90 mg    [provider]  DULoxetine (CYMBALTA) 60 MG capsule Take 60 mg by mouth every evening. Total daily dose=90    [provider]  enoxaparin (LOVENOX) 30 MG/0.3ML injection Inject 0.3 mLs (30 mg total) into the skin every 12 (twelve) hours for 14 days. 08/14/17 08/28/17  Watt Climes, PA  esomeprazole (NEXIUM) 40 MG capsule Take by mouth. 10/14/17 10/14/18   [provider]  gabapentin (NEURONTIN) 300 MG capsule Take 300 mg by mouth 3 (three) times daily.    [provider]  IRON PO Take by mouth.    [provider]  linaclotide (LINZESS) 290 MCG CAPS capsule Take 290 mcg by mouth daily as needed.     [provider]  losartan-hydrochlorothiazide (HYZAAR) 100-25 MG tablet Take 1 tablet by mouth daily.    [provider]  Menthol, Topical Analgesic, (ICY HOT EX) Apply 1 application topically 3 (three) times daily as needed (for pain.).    [provider]  methimazole (TAPAZOLE) 10 MG tablet Take 10 mg by mouth daily.     [provider]  Multiple Vitamins-Minerals (ALIVE WOMENS ENERGY PO) Take 1 tablet by mouth daily.    [provider]  omeprazole (PRILOSEC) 40 MG capsule Take 40 mg by mouth 2 (two) times daily. 07/21/17   [provider]  oxyCODONE (OXY IR/ROXICODONE) 5 MG immediate release tablet Take 1 tablet (5 mg total) by mouth every  3 (three) hours as needed for moderate pain ((score 4 to 6)). 08/13/17   Toni Arthurs, NP  predniSONE (DELTASONE) 50 MG tablet Take one 50 mg tablet once daily for the next five days. 02/04/18   Lannie Fields, PA-C  Probiotic Product (PROBIOTIC PO) Take 1 capsule by mouth 2 (two) times daily.    [provider]  Propylene Glycol (SYSTANE BALANCE) 0.6 % SOLN Place 1-2 drops into both eyes 3 (three) times daily as needed (for dry eyes.).    [provider]  sodium chloride (OCEAN) 0.65 % SOLN nasal spray Place 1-2 sprays into both nostrils daily as needed for congestion (for moisture/congestion.).    [provider]  solifenacin (VESICARE) 10 MG tablet Take 1 tablet (10 mg total) by mouth daily. 10/22/17   Stoioff, Ronda Fairly, MD  sucralfate (CARAFATE) 1 g tablet Take 1 g by mouth 4 (four) times daily.    [provider]  tetrahydrozoline 0.05 % ophthalmic solution Place 1-2 drops into both eyes daily as  needed (for redness.).    [provider]  tiZANidine (ZANAFLEX) 4 MG tablet Take 4 mg by mouth 3 (three) times daily as needed for muscle spasms.    [provider]  traMADol (ULTRAM) 50 MG tablet Take 50 mg by mouth every 6 (six) hours as needed. for pain 09/03/17   [provider]  traZODone (DESYREL) 150 MG tablet Take 150 mg by mouth at bedtime.     [provider]  VITAMIN A PO Take 1 capsule by mouth every evening.    [provider]  vitamin B-12 (CYANOCOBALAMIN) 1000 MCG tablet Take 1,000 mcg by mouth every evening.    [provider]  vitamin E 400 UNIT capsule Take 400 Units by mouth daily.    [provider]    Allergies Ibuprofen; Shellfish allergy; Aspirin; and Tramadol hcl  Family History  Problem Relation Age of Onset  . Cancer Mother   . Heart disease Father   . Breast cancer Neg Hx     Social History Social History   Tobacco Use  . Smoking status: Former Smoker    Last attempt to quit: 1994    Years since quitting: 25.4  . Smokeless tobacco: Never Used  Substance Use Topics  . Alcohol use: No    Alcohol/week: 0.0 oz  . Drug use: No     Review of Systems  Constitutional: No fever/chills Eyes: No visual changes. No discharge ENT: No upper respiratory complaints. Cardiovascular: no chest pain. Respiratory: no cough. No SOB. Gastrointestinal: No abdominal pain.  No nausea, no vomiting.  No diarrhea.  No constipation. Genitourinary: Negative for dysuria. No hematuria Musculoskeletal: Patient has neck pain and left shoulder pain.  Skin: Negative for rash, abrasions, lacerations, ecchymosis. Neurological: Negative for headaches, focal weakness or numbness.   ____________________________________________   PHYSICAL EXAM:  VITAL SIGNS: ED Triage Vitals [02/04/18 1822]  Enc Vitals Group     BP (!) 144/80     Pulse Rate 90     Resp 18     Temp 98.6 F (37 C)     Temp Source Oral     SpO2  100 %     Weight 260 lb (117.9 kg)     Height      Head Circumference      Peak Flow      Pain Score 9     Pain Loc      Pain Edu?  Excl. in [place]?      Constitutional: Alert and oriented. Well appearing and in no acute distress. Eyes: Conjunctivae are normal. PERRL. EOMI. Head: Atraumatic. ENT:      Ears: TMs are pearly.      Nose: No congestion/rhinnorhea.      Mouth/Throat: Mucous membranes are moist.  Neck: No stridor.  Patient is able to perform full range of motion and has tenderness with palpation along the paraspinal muscles of the cervical spine.  Cardiovascular: Normal rate, regular rhythm. Normal S1 and S2.  Good peripheral circulation. Respiratory: Normal respiratory effort without tachypnea or retractions. Lungs CTAB. Good air entry to the bases with no decreased or absent breath sounds. Musculoskeletal: Full range of motion to all extremities. No gross deformities appreciated.  No left rotator cuff weakness appreciated.  Palpable radial pulse, left. Neurologic:  Normal speech and language. No gross focal neurologic deficits are appreciated.  Skin:  Skin is warm, dry and intact. No rash noted.   ____________________________________________   LABS (all labs ordered are listed, but only abnormal results are displayed)  Labs Reviewed - No data to display ____________________________________________  EKG   ____________________________________________  RADIOLOGY I personally viewed and evaluated these images as part of my medical decision making, as well as reviewing the written report by the radiologist.  Dg Cervical Spine 2-3 Views  Result Date: 02/04/2018 CLINICAL DATA:  Neck pain after fall. EXAM: CERVICAL SPINE - 2-3 VIEW COMPARISON:  06/09/2016 FINDINGS: Previous anterior cervical disc fusion at C5-6. The alignment of the cervical spine is normal. The vertebral body heights are well preserved. There is no fracture or dislocation identified. Ventral spurring  is noted at C4. IMPRESSION: 1. No acute findings. 2. Status post ACDF at C5-6. Electronically Signed   By: Kerby Moors M.D.   On: 02/04/2018 20:29   Dg Shoulder Left  Result Date: 02/04/2018 CLINICAL DATA:  Status post fall.  Shoulder pain. EXAM: LEFT SHOULDER - 2+ VIEW COMPARISON:  MR 09/05/2015 FINDINGS: There is no evidence of fracture or dislocation. Mild AC joint and moderate glenohumeral joint osteoarthritis identified. Soft tissues are unremarkable. IMPRESSION: 1. No acute findings. 2. Osteoarthritis. Electronically Signed   By: Kerby Moors M.D.   On: 02/04/2018 18:57   Dg Humerus Left  Result Date: 02/04/2018 CLINICAL DATA:  Left shoulder and arm pain after fall EXAM: LEFT HUMERUS - 2+ VIEW COMPARISON:  None. FINDINGS: There is no evidence of fracture or other focal bone lesions. Moderate osteoarthritis is identified at the glenohumeral joint. Mild acromioclavicular joint osteoarthritis noted. Soft tissues are unremarkable. IMPRESSION: 1. No acute abnormality. 2. Osteoarthritis. Electronically Signed   By: Kerby Moors M.D.   On: 02/04/2018 18:56    ____________________________________________    PROCEDURES  Procedure(s) performed:    Procedures    Medications  methylPREDNISolone sodium succinate (SOLU-MEDROL) 125 mg/2 mL injection 125 mg (125 mg Intramuscular Given 02/04/18 2056)     ____________________________________________   INITIAL IMPRESSION / ASSESSMENT AND PLAN / ED COURSE  Pertinent labs & imaging results that were available during my care of the patient were reviewed by me and considered in my medical decision making (see chart for details).  Review of the [place] CSRS was performed in accordance of the [place] prior to dispensing any controlled drugs.      Assessment and Plan: Fall:  Patient presents to the emergency department with left shoulder pain after a fall 4 days ago.  X-ray examination of the neck and left shoulder reveal no  acute bony  abnormalities.  Patient was started empirically on a short course of prednisone as she does not tolerate anti-inflammatories.  Patient requested "something stronger" I offered to fill a prescription for tramadol but patient reports that she does not tolerate tramadol either.  A request for stronger pain medication was not granted at this emergency department encounter.  Patient was advised to follow-up with orthopedics as needed.  All patient questions were answered.   ____________________________________________  FINAL CLINICAL IMPRESSION(S) / ED DIAGNOSES  Final diagnoses:  Fall, initial encounter      NEW MEDICATIONS STARTED DURING THIS VISIT:  ED Discharge Orders        Ordered    predniSONE (DELTASONE) 50 MG tablet     02/04/18 2033          This chart was dictated using voice recognition software/Dragon. Despite best efforts to proofread, errors can occur which can change the meaning. Any change was purely unintentional.    Lannie Fields, PA-C 02/04/18 2156    Nena Polio, MD 02/04/18 2245

## 2018-02-04 NOTE — ED Triage Notes (Signed)
Left shoulder and upper arm pain after trip and fall yesterday. States that she caught herself when she tripped and fell outside. No obvious deformity. CMS intact .Pt alert and oriented X4, active, cooperative, pt in NAD. RR even and unlabored, color WNL.

## 2018-02-05 DIAGNOSIS — R51 Headache: Secondary | ICD-10-CM | POA: Diagnosis not present

## 2018-02-05 DIAGNOSIS — M25512 Pain in left shoulder: Secondary | ICD-10-CM | POA: Diagnosis not present

## 2018-02-05 DIAGNOSIS — R600 Localized edema: Secondary | ICD-10-CM | POA: Diagnosis not present

## 2018-02-05 DIAGNOSIS — E042 Nontoxic multinodular goiter: Secondary | ICD-10-CM | POA: Diagnosis not present

## 2018-02-05 DIAGNOSIS — F325 Major depressive disorder, single episode, in full remission: Secondary | ICD-10-CM | POA: Diagnosis not present

## 2018-02-06 DIAGNOSIS — L304 Erythema intertrigo: Secondary | ICD-10-CM | POA: Diagnosis not present

## 2018-02-09 DIAGNOSIS — J209 Acute bronchitis, unspecified: Secondary | ICD-10-CM | POA: Diagnosis not present

## 2018-02-12 DIAGNOSIS — K581 Irritable bowel syndrome with constipation: Secondary | ICD-10-CM | POA: Diagnosis not present

## 2018-02-12 DIAGNOSIS — N760 Acute vaginitis: Secondary | ICD-10-CM | POA: Diagnosis not present

## 2018-02-12 DIAGNOSIS — K210 Gastro-esophageal reflux disease with esophagitis: Secondary | ICD-10-CM | POA: Diagnosis not present

## 2018-02-12 DIAGNOSIS — N898 Other specified noninflammatory disorders of vagina: Secondary | ICD-10-CM | POA: Diagnosis not present

## 2018-02-12 DIAGNOSIS — B373 Candidiasis of vulva and vagina: Secondary | ICD-10-CM | POA: Diagnosis not present

## 2018-02-12 DIAGNOSIS — R1032 Left lower quadrant pain: Secondary | ICD-10-CM | POA: Diagnosis not present

## 2018-02-12 DIAGNOSIS — G8929 Other chronic pain: Secondary | ICD-10-CM | POA: Diagnosis not present

## 2018-02-17 ENCOUNTER — Ambulatory Visit: Payer: Self-pay | Admitting: Psychiatry

## 2018-02-17 DIAGNOSIS — N898 Other specified noninflammatory disorders of vagina: Secondary | ICD-10-CM | POA: Diagnosis not present

## 2018-02-17 DIAGNOSIS — N760 Acute vaginitis: Secondary | ICD-10-CM | POA: Diagnosis not present

## 2018-02-17 DIAGNOSIS — B373 Candidiasis of vulva and vagina: Secondary | ICD-10-CM | POA: Diagnosis not present

## 2018-02-17 DIAGNOSIS — E0500 Thyrotoxicosis with diffuse goiter without thyrotoxic crisis or storm: Secondary | ICD-10-CM | POA: Diagnosis not present

## 2018-02-23 ENCOUNTER — Ambulatory Visit: Payer: 59 | Admitting: Psychiatry

## 2018-02-24 ENCOUNTER — Encounter (INDEPENDENT_AMBULATORY_CARE_PROVIDER_SITE_OTHER): Payer: Self-pay | Admitting: Vascular Surgery

## 2018-02-24 ENCOUNTER — Ambulatory Visit (INDEPENDENT_AMBULATORY_CARE_PROVIDER_SITE_OTHER): Payer: Medicare HMO | Admitting: Vascular Surgery

## 2018-02-24 VITALS — BP 143/84 | HR 77 | Resp 15 | Ht 68.0 in | Wt 259.0 lb

## 2018-02-24 DIAGNOSIS — E118 Type 2 diabetes mellitus with unspecified complications: Secondary | ICD-10-CM

## 2018-02-24 DIAGNOSIS — I10 Essential (primary) hypertension: Secondary | ICD-10-CM

## 2018-02-24 DIAGNOSIS — M79604 Pain in right leg: Secondary | ICD-10-CM | POA: Diagnosis not present

## 2018-02-24 NOTE — Assessment & Plan Note (Signed)
blood pressure control important in reducing the progression of atherosclerotic disease. On appropriate oral medications.  

## 2018-02-24 NOTE — Progress Notes (Signed)
MRN : [id number]  Christine Higgins is a 62 y.o. (29-Apr-1956) female who presents with chief complaint of  Chief Complaint  Patient presents with  . Follow-up    Swelling from the knee to the groin  .  History of Present Illness: Patient returns today in follow up.  She was seen by my partner Dr. Delana Meyer well over a year ago for leg pain.  She had mild venous disease at that time.  She is complaining of more swelling and discomfort in her right medial thigh area.  She has undergone a knee replacement on the left and this has helped.  Her left leg is doing well.  She does not have a lot of swelling in the lower part of the legs.  No chest pain or shortness of breath.  No fevers or chills.  Compression and elevation have not really helped her symptoms much.  Current Outpatient Medications  Medication Sig Dispense Refill  . acetaminophen (TYLENOL) 650 MG CR tablet Take 1,300 mg by mouth 2 (two) times daily as needed for pain.    . Ascorbic Acid (VITAMIN C) 1000 MG tablet Take 1,000 mg by mouth every evening.     Marland Kitchen atorvastatin (LIPITOR) 10 MG tablet Take 10 mg by mouth daily after supper.     . Benzocaine-Calamine-Camphor (BAND-AID CALAMINE SPRAY EX) Apply 1 application topically 3 (three) times daily as needed (for pain).    . calcium carbonate (OS-CAL - DOSED IN MG OF ELEMENTAL CALCIUM) 1250 (500 Ca) MG tablet Take 1 tablet by mouth every evening.    . celecoxib (CELEBREX) 200 MG capsule Take 200 mg by mouth 2 (two) times daily.    . cholecalciferol (VITAMIN D) 1000 units tablet Take 1,000 Units by mouth every evening.    . ciclopirox (LOPROX) 0.77 % cream APPLY TO AFFECTED AREA TWICE A DAY    . clobetasol (TEMOVATE) 0.05 % external solution Apply 1 application topically 2 (two) times daily as needed (for dry scalp).    . clonazePAM (KLONOPIN) 1 MG tablet Take 1 tablet (1 mg total) by mouth at bedtime as needed (for sleep/anxiety). 14 tablet 0  . clotrimazole-betamethasone (LOTRISONE) cream  Apply 1 application topically 2 (two) times daily as needed (for irritated skin).     Marland Kitchen diphenhydrAMINE (BENADRYL) 25 MG tablet Take 25 mg by mouth every 6 (six) hours as needed (for allergies.).     Marland Kitchen DULoxetine (CYMBALTA) 30 MG capsule Take 30 mg by mouth every evening. Total dose=90 mg    . DULoxetine (CYMBALTA) 60 MG capsule Take 60 mg by mouth every evening. Total daily dose=90    . esomeprazole (NEXIUM) 40 MG capsule Take by mouth.    . gabapentin (NEURONTIN) 300 MG capsule Take 300 mg by mouth 3 (three) times daily.    . IRON PO Take by mouth.    Marland Kitchen ketoconazole (NIZORAL) 2 % cream APPLY ON THE SKIN TWICE A DAY AS NEEDED  4  . linaclotide (LINZESS) 290 MCG CAPS capsule Take 290 mcg by mouth daily as needed.     Marland Kitchen losartan-hydrochlorothiazide (HYZAAR) 100-25 MG tablet Take 1 tablet by mouth daily.    . Menthol, Topical Analgesic, (ICY HOT EX) Apply 1 application topically 3 (three) times daily as needed (for pain.).    Marland Kitchen methimazole (TAPAZOLE) 10 MG tablet Take 10 mg by mouth daily.     . Multiple Vitamins-Minerals (ALIVE WOMENS ENERGY PO) Take 1 tablet by mouth daily.    Marland Kitchen  omeprazole (PRILOSEC) 40 MG capsule Take 40 mg by mouth 2 (two) times daily.  11  . oxyCODONE (OXY IR/ROXICODONE) 5 MG immediate release tablet Take 1 tablet (5 mg total) by mouth every 3 (three) hours as needed for moderate pain ((score 4 to 6)). 120 tablet 0  . pantoprazole (PROTONIX) 40 MG tablet Take by mouth.    . predniSONE (DELTASONE) 50 MG tablet Take one 50 mg tablet once daily for the next five days. 5 tablet 0  . Probiotic Product (PROBIOTIC PO) Take 1 capsule by mouth 2 (two) times daily.    . propranolol (INDERAL) 40 MG tablet Take 40 mg by mouth 2 (two) times daily.  11  . Propylene Glycol (SYSTANE BALANCE) 0.6 % SOLN Place 1-2 drops into both eyes 3 (three) times daily as needed (for dry eyes.).    Marland Kitchen sodium chloride (OCEAN) 0.65 % SOLN nasal spray Place 1-2 sprays into both nostrils daily as needed for  congestion (for moisture/congestion.).    Marland Kitchen solifenacin (VESICARE) 10 MG tablet Take 1 tablet (10 mg total) by mouth daily. 30 tablet 6  . sucralfate (CARAFATE) 1 g tablet Take 1 g by mouth 2 (two) times daily.     Marland Kitchen tetrahydrozoline 0.05 % ophthalmic solution Place 1-2 drops into both eyes daily as needed (for redness.).    Marland Kitchen tiZANidine (ZANAFLEX) 4 MG tablet Take 4 mg by mouth 3 (three) times daily as needed for muscle spasms.    . traMADol (ULTRAM) 50 MG tablet Take 50 mg by mouth every 6 (six) hours as needed. for pain  0  . traZODone (DESYREL) 150 MG tablet Take 150 mg by mouth at bedtime.     . triamcinolone cream (KENALOG) 0.1 % APPLY ON THE SKIN TWICE A DAY AS NEEDED ITCHING. AVOID FACE, GROIN, UNDERARMS  0  . VITAMIN A PO Take 1 capsule by mouth every evening.    . vitamin B-12 (CYANOCOBALAMIN) 1000 MCG tablet Take 1,000 mcg by mouth every evening.    . vitamin E 400 UNIT capsule Take 400 Units by mouth daily.    Marland Kitchen enoxaparin (LOVENOX) 30 MG/0.3ML injection Inject 0.3 mLs (30 mg total) into the skin every 12 (twelve) hours for 14 days. 28 Syringe 0   No current facility-administered medications for this visit.     Past Medical History:  Diagnosis Date  . Breast mass    LEFT x 3 months per pt and palpated by physician  . Cervical disc disease   . Chronic pain syndrome   . Degenerative disc disease, lumbar   . Fibromyalgia   . Fibromyalgia   . GERD (gastroesophageal reflux disease)   . Glaucoma   . Graves disease   . Hemorrhoids   . Hyperlipidemia   . Hypertension   . Thyroid disease     Past Surgical History:  Procedure Laterality Date  . BACK SURGERY     sumbar  . carpel tunn Right   . carpel tunnel Left   . CESAREAN SECTION    . COLONOSCOPY    . ESOPHAGOGASTRODUODENOSCOPY (EGD) WITH PROPOFOL N/A 06/26/2017   Procedure: ESOPHAGOGASTRODUODENOSCOPY (EGD) WITH PROPOFOL;  Surgeon: Lollie Sails, MD;  Location: [facility] ENDOSCOPY;  Service: Endoscopy;  Laterality: N/A;    . EXCISION MORTON'S NEUROMA Left 02/05/2017   Procedure: EXCISION MORTON'S NEUROMA;  Surgeon: Samara Deist, DPM;  Location: [facility];  Service: Podiatry;  Laterality: Left;  iva with local  . HAND SURGERY Right    scar tissue  removal  . JOINT REPLACEMENT     right hip arthroplasty 08/25/15  . KNEE ARTHROPLASTY Left 08/11/2017   Procedure: COMPUTER ASSISTED TOTAL KNEE ARTHROPLASTY;  Surgeon: Dereck Leep, MD;  Location: [facility]S;  Service: Orthopedics;  Laterality: Left;  . NECK SURGERY     "disk implant"  . SHOULDER SURGERY Right    spur removal  . TOTAL HIP ARTHROPLASTY Right     Social History Social History   Tobacco Use  . Smoking status: Former Smoker    Last attempt to quit: 1994    Years since quitting: 25.5  . Smokeless tobacco: Never Used  Substance Use Topics  . Alcohol use: No    Alcohol/week: 0.0 oz  . Drug use: No     Family History Family History  Problem Relation Age of Onset  . Cancer Mother   . Heart disease Father   . Breast cancer Neg Hx      Allergies  Allergen Reactions  . Cephalexin Hives  . Ibuprofen Nausea And Vomiting  . Shellfish Allergy Nausea And Vomiting  . Aspirin Nausea And Vomiting  . Tramadol Hcl Nausea And Vomiting     REVIEW OF SYSTEMS (Negative unless checked)  Constitutional: [] Weight loss  [] Fever  [] Chills Cardiac: [] Chest pain   [] Chest pressure   [] Palpitations   [] Shortness of breath when laying flat   [] Shortness of breath at rest   [] Shortness of breath with exertion. Vascular:  [] Pain in legs with walking   [] Pain in legs at rest   [] Pain in legs when laying flat   [] Claudication   [] Pain in feet when walking  [] Pain in feet at rest  [] Pain in feet when laying flat   [] History of DVT   [] Phlebitis   [] Swelling in legs   [x] Varicose veins   [] Non-healing ulcers Pulmonary:   [] Uses home oxygen   [] Productive cough   [] Hemoptysis   [] Wheeze  [] COPD   [] Asthma Neurologic:  [] Dizziness  [] Blackouts    [] Seizures   [] History of stroke   [] History of TIA  [] Aphasia   [] Temporary blindness   [] Dysphagia   [] Weakness or numbness in arms   [] Weakness or numbness in legs Musculoskeletal:  [x] Arthritis   [] Joint swelling   [] Joint pain   [] Low back pain Hematologic:  [] Easy bruising  [] Easy bleeding   [] Hypercoagulable state   [] Anemic   Gastrointestinal:  [] Blood in stool   [] Vomiting blood  [] Gastroesophageal reflux/heartburn   [] Abdominal pain Genitourinary:  [] Chronic kidney disease   [] Difficult urination  [] Frequent urination  [] Burning with urination   [] Hematuria Skin:  [] Rashes   [] Ulcers   [] Wounds Psychological:  [x] History of anxiety   []  History of major depression.  Physical Examination  BP (!) 143/84 (BP Location: Right Arm, Patient Position: Sitting)   Pulse 77   Resp 15   Ht 5\' 8"  (1.727 m)   Wt 259 lb (117.5 kg)   BMI 39.38 kg/m  Gen:  WD/WN, NAD Head: [place]/AT, No temporalis wasting. Ear/Nose/Throat: Hearing grossly intact, nares w/o erythema or drainage Eyes: Conjunctiva clear. Sclera non-icteric Neck: Supple.  Trachea midline Pulmonary:  Good air movement, no use of accessory muscles.  Cardiac: RRR, no JVD Vascular:  Vessel Right Left  Radial Palpable Palpable                          PT Palpable Palpable  DP Palpable Palpable    Musculoskeletal: M/S 5/5 throughout.  No  deformity or atrophy.  Mild swelling in the right medial thigh area that could correlate with deeper branches of the saphenous vein system. Neurologic: Sensation grossly intact in extremities.  Symmetrical.  Speech is fluent.  Psychiatric: Judgment intact, Mood & affect appropriate for pt's clinical situation. Dermatologic: No rashes or ulcers noted.  No cellulitis or open wounds.       Labs No results found for this or any previous visit (from the past 2160 hour(s)).  Radiology Dg Cervical Spine 2-3 Views  Result Date: 02/04/2018 CLINICAL DATA:  Neck pain after fall. EXAM: CERVICAL  SPINE - 2-3 VIEW COMPARISON:  06/09/2016 FINDINGS: Previous anterior cervical disc fusion at C5-6. The alignment of the cervical spine is normal. The vertebral body heights are well preserved. There is no fracture or dislocation identified. Ventral spurring is noted at C4. IMPRESSION: 1. No acute findings. 2. Status post ACDF at C5-6. Electronically Signed   By: Kerby Moors M.D.   On: 02/04/2018 20:29   Dg Shoulder Left  Result Date: 02/04/2018 CLINICAL DATA:  Status post fall.  Shoulder pain. EXAM: LEFT SHOULDER - 2+ VIEW COMPARISON:  MR 09/05/2015 FINDINGS: There is no evidence of fracture or dislocation. Mild AC joint and moderate glenohumeral joint osteoarthritis identified. Soft tissues are unremarkable. IMPRESSION: 1. No acute findings. 2. Osteoarthritis. Electronically Signed   By: Kerby Moors M.D.   On: 02/04/2018 18:57   Dg Humerus Left  Result Date: 02/04/2018 CLINICAL DATA:  Left shoulder and arm pain after fall EXAM: LEFT HUMERUS - 2+ VIEW COMPARISON:  None. FINDINGS: There is no evidence of fracture or other focal bone lesions. Moderate osteoarthritis is identified at the glenohumeral joint. Mild acromioclavicular joint osteoarthritis noted. Soft tissues are unremarkable. IMPRESSION: 1. No acute abnormality. 2. Osteoarthritis. Electronically Signed   By: Kerby Moors M.D.   On: 02/04/2018 18:56    Assessment/Plan  HTN, goal below 140/90 blood pressure control important in reducing the progression of atherosclerotic disease. On appropriate oral medications.   DM II (diabetes mellitus, type II), controlled ([place]) blood glucose control important in reducing the progression of atherosclerotic disease. Also, involved in wound healing. On appropriate medications.   Leg pain Unclear if this could be due to vascular disease.  Has had a previous normal arterial work-up.  Had some mild venous disease and I think would be reasonable to recheck a venous reflux study at her convenience.   She should continue conservative therapies of compression stockings and elevation.  Return after her venous reflux study    Leotis Pain, MD  02/24/2018 11:43 AM    This note was created with Dragon medical transcription system.  Any errors from dictation are purely unintentional

## 2018-02-24 NOTE — Assessment & Plan Note (Signed)
blood glucose control important in reducing the progression of atherosclerotic disease. Also, involved in wound healing. On appropriate medications.  

## 2018-02-24 NOTE — Assessment & Plan Note (Signed)
Unclear if this could be due to vascular disease.  Has had a previous normal arterial work-up.  Had some mild venous disease and I think would be reasonable to recheck a venous reflux study at her convenience.  She should continue conservative therapies of compression stockings and elevation.  Return after her venous reflux study

## 2018-02-24 NOTE — Patient Instructions (Signed)

## 2018-02-27 DIAGNOSIS — E0500 Thyrotoxicosis with diffuse goiter without thyrotoxic crisis or storm: Secondary | ICD-10-CM | POA: Diagnosis not present

## 2018-02-27 DIAGNOSIS — E042 Nontoxic multinodular goiter: Secondary | ICD-10-CM | POA: Diagnosis not present

## 2018-03-05 DIAGNOSIS — M7542 Impingement syndrome of left shoulder: Secondary | ICD-10-CM | POA: Diagnosis not present

## 2018-03-05 DIAGNOSIS — J310 Chronic rhinitis: Secondary | ICD-10-CM | POA: Diagnosis not present

## 2018-03-05 DIAGNOSIS — R05 Cough: Secondary | ICD-10-CM | POA: Diagnosis not present

## 2018-03-05 DIAGNOSIS — J449 Chronic obstructive pulmonary disease, unspecified: Secondary | ICD-10-CM | POA: Diagnosis not present

## 2018-03-05 DIAGNOSIS — R0609 Other forms of dyspnea: Secondary | ICD-10-CM | POA: Diagnosis not present

## 2018-03-05 DIAGNOSIS — M9902 Segmental and somatic dysfunction of thoracic region: Secondary | ICD-10-CM | POA: Diagnosis not present

## 2018-03-05 DIAGNOSIS — M5134 Other intervertebral disc degeneration, thoracic region: Secondary | ICD-10-CM | POA: Diagnosis not present

## 2018-03-05 DIAGNOSIS — M25512 Pain in left shoulder: Secondary | ICD-10-CM | POA: Diagnosis not present

## 2018-03-05 DIAGNOSIS — M5136 Other intervertebral disc degeneration, lumbar region: Secondary | ICD-10-CM | POA: Diagnosis not present

## 2018-03-05 DIAGNOSIS — K219 Gastro-esophageal reflux disease without esophagitis: Secondary | ICD-10-CM | POA: Diagnosis not present

## 2018-03-05 DIAGNOSIS — M9903 Segmental and somatic dysfunction of lumbar region: Secondary | ICD-10-CM | POA: Diagnosis not present

## 2018-03-16 DIAGNOSIS — M5134 Other intervertebral disc degeneration, thoracic region: Secondary | ICD-10-CM | POA: Diagnosis not present

## 2018-03-16 DIAGNOSIS — M9903 Segmental and somatic dysfunction of lumbar region: Secondary | ICD-10-CM | POA: Diagnosis not present

## 2018-03-16 DIAGNOSIS — M9902 Segmental and somatic dysfunction of thoracic region: Secondary | ICD-10-CM | POA: Diagnosis not present

## 2018-03-16 DIAGNOSIS — M5136 Other intervertebral disc degeneration, lumbar region: Secondary | ICD-10-CM | POA: Diagnosis not present

## 2018-03-31 DIAGNOSIS — M1711 Unilateral primary osteoarthritis, right knee: Secondary | ICD-10-CM | POA: Diagnosis not present

## 2018-04-02 DIAGNOSIS — B359 Dermatophytosis, unspecified: Secondary | ICD-10-CM | POA: Diagnosis not present

## 2018-04-02 DIAGNOSIS — L280 Lichen simplex chronicus: Secondary | ICD-10-CM | POA: Diagnosis not present

## 2018-04-02 DIAGNOSIS — B351 Tinea unguium: Secondary | ICD-10-CM | POA: Diagnosis not present

## 2018-04-02 DIAGNOSIS — B354 Tinea corporis: Secondary | ICD-10-CM | POA: Diagnosis not present

## 2018-04-03 DIAGNOSIS — M9902 Segmental and somatic dysfunction of thoracic region: Secondary | ICD-10-CM | POA: Diagnosis not present

## 2018-04-03 DIAGNOSIS — M5134 Other intervertebral disc degeneration, thoracic region: Secondary | ICD-10-CM | POA: Diagnosis not present

## 2018-04-03 DIAGNOSIS — M9903 Segmental and somatic dysfunction of lumbar region: Secondary | ICD-10-CM | POA: Diagnosis not present

## 2018-04-03 DIAGNOSIS — M5136 Other intervertebral disc degeneration, lumbar region: Secondary | ICD-10-CM | POA: Diagnosis not present

## 2018-04-06 DIAGNOSIS — E119 Type 2 diabetes mellitus without complications: Secondary | ICD-10-CM | POA: Diagnosis not present

## 2018-04-06 DIAGNOSIS — M5134 Other intervertebral disc degeneration, thoracic region: Secondary | ICD-10-CM | POA: Diagnosis not present

## 2018-04-06 DIAGNOSIS — M5136 Other intervertebral disc degeneration, lumbar region: Secondary | ICD-10-CM | POA: Diagnosis not present

## 2018-04-06 DIAGNOSIS — I10 Essential (primary) hypertension: Secondary | ICD-10-CM | POA: Diagnosis not present

## 2018-04-06 DIAGNOSIS — E7800 Pure hypercholesterolemia, unspecified: Secondary | ICD-10-CM | POA: Diagnosis not present

## 2018-04-06 DIAGNOSIS — M9903 Segmental and somatic dysfunction of lumbar region: Secondary | ICD-10-CM | POA: Diagnosis not present

## 2018-04-06 DIAGNOSIS — M9902 Segmental and somatic dysfunction of thoracic region: Secondary | ICD-10-CM | POA: Diagnosis not present

## 2018-04-15 DIAGNOSIS — K219 Gastro-esophageal reflux disease without esophagitis: Secondary | ICD-10-CM | POA: Diagnosis not present

## 2018-04-15 DIAGNOSIS — E7800 Pure hypercholesterolemia, unspecified: Secondary | ICD-10-CM | POA: Diagnosis not present

## 2018-04-15 DIAGNOSIS — I700 Atherosclerosis of aorta: Secondary | ICD-10-CM | POA: Diagnosis not present

## 2018-04-15 DIAGNOSIS — Z0000 Encounter for general adult medical examination without abnormal findings: Secondary | ICD-10-CM | POA: Diagnosis not present

## 2018-04-15 DIAGNOSIS — I10 Essential (primary) hypertension: Secondary | ICD-10-CM | POA: Diagnosis not present

## 2018-04-15 DIAGNOSIS — F325 Major depressive disorder, single episode, in full remission: Secondary | ICD-10-CM | POA: Diagnosis not present

## 2018-04-15 DIAGNOSIS — M79602 Pain in left arm: Secondary | ICD-10-CM | POA: Diagnosis not present

## 2018-04-15 DIAGNOSIS — E119 Type 2 diabetes mellitus without complications: Secondary | ICD-10-CM | POA: Diagnosis not present

## 2018-04-16 ENCOUNTER — Other Ambulatory Visit
Admission: RE | Admit: 2018-04-16 | Discharge: 2018-04-16 | Disposition: A | Discharge status: Home / Self Care | Payer: Medicare HMO | Source: Ambulatory Visit | Attending: Gastroenterology | Admitting: Gastroenterology

## 2018-04-16 DIAGNOSIS — M5136 Other intervertebral disc degeneration, lumbar region: Secondary | ICD-10-CM | POA: Diagnosis not present

## 2018-04-16 DIAGNOSIS — M9902 Segmental and somatic dysfunction of thoracic region: Secondary | ICD-10-CM | POA: Diagnosis not present

## 2018-04-16 DIAGNOSIS — M5134 Other intervertebral disc degeneration, thoracic region: Secondary | ICD-10-CM | POA: Diagnosis not present

## 2018-04-16 DIAGNOSIS — M9903 Segmental and somatic dysfunction of lumbar region: Secondary | ICD-10-CM | POA: Diagnosis not present

## 2018-04-16 DIAGNOSIS — R197 Diarrhea, unspecified: Secondary | ICD-10-CM | POA: Insufficient documentation

## 2018-04-16 LAB — GASTROINTESTINAL PANEL BY PCR, STOOL (REPLACES STOOL CULTURE)

## 2018-04-16 LAB — C DIFFICILE QUICK SCREEN W PCR REFLEX
C Diff antigen: NEGATIVE
C Diff interpretation: NOT DETECTED
C Diff toxin: NEGATIVE

## 2018-04-21 DIAGNOSIS — M9983 Other biomechanical lesions of lumbar region: Secondary | ICD-10-CM | POA: Diagnosis not present

## 2018-04-21 DIAGNOSIS — M48062 Spinal stenosis, lumbar region with neurogenic claudication: Secondary | ICD-10-CM | POA: Diagnosis not present

## 2018-04-22 ENCOUNTER — Ambulatory Visit: Payer: Medicare HMO | Admitting: Urology

## 2018-04-23 DIAGNOSIS — M25512 Pain in left shoulder: Secondary | ICD-10-CM | POA: Diagnosis not present

## 2018-04-23 DIAGNOSIS — M542 Cervicalgia: Secondary | ICD-10-CM | POA: Diagnosis not present

## 2018-05-01 ENCOUNTER — Encounter (INDEPENDENT_AMBULATORY_CARE_PROVIDER_SITE_OTHER): Payer: Medicare HMO

## 2018-05-01 ENCOUNTER — Ambulatory Visit (INDEPENDENT_AMBULATORY_CARE_PROVIDER_SITE_OTHER): Payer: Medicare HMO | Admitting: Nurse Practitioner

## 2018-05-04 ENCOUNTER — Other Ambulatory Visit: Payer: Self-pay | Admitting: Physician Assistant

## 2018-05-04 DIAGNOSIS — M25561 Pain in right knee: Secondary | ICD-10-CM | POA: Diagnosis not present

## 2018-05-11 DIAGNOSIS — M25512 Pain in left shoulder: Secondary | ICD-10-CM | POA: Diagnosis not present

## 2018-05-14 DIAGNOSIS — M75102 Unspecified rotator cuff tear or rupture of left shoulder, not specified as traumatic: Secondary | ICD-10-CM | POA: Diagnosis not present

## 2018-05-17 ENCOUNTER — Ambulatory Visit
Admission: RE | Admit: 2018-05-17 | Discharge: 2018-05-17 | Disposition: A | Discharge status: Home / Self Care | Payer: Medicare HMO | Source: Ambulatory Visit | Attending: Physician Assistant | Admitting: Physician Assistant

## 2018-05-17 DIAGNOSIS — M25461 Effusion, right knee: Secondary | ICD-10-CM | POA: Diagnosis not present

## 2018-05-17 DIAGNOSIS — M67461 Ganglion, right knee: Secondary | ICD-10-CM | POA: Insufficient documentation

## 2018-05-17 DIAGNOSIS — M25561 Pain in right knee: Secondary | ICD-10-CM | POA: Diagnosis not present

## 2018-05-17 DIAGNOSIS — X58XXXA Exposure to other specified factors, initial encounter: Secondary | ICD-10-CM | POA: Insufficient documentation

## 2018-05-17 DIAGNOSIS — M1711 Unilateral primary osteoarthritis, right knee: Secondary | ICD-10-CM | POA: Insufficient documentation

## 2018-05-17 DIAGNOSIS — S83241A Other tear of medial meniscus, current injury, right knee, initial encounter: Secondary | ICD-10-CM | POA: Diagnosis not present

## 2018-05-17 DIAGNOSIS — S83281A Other tear of lateral meniscus, current injury, right knee, initial encounter: Secondary | ICD-10-CM | POA: Diagnosis not present

## 2018-05-18 ENCOUNTER — Ambulatory Visit (INDEPENDENT_AMBULATORY_CARE_PROVIDER_SITE_OTHER): Payer: Medicare HMO

## 2018-05-18 ENCOUNTER — Ambulatory Visit (INDEPENDENT_AMBULATORY_CARE_PROVIDER_SITE_OTHER): Payer: Medicare HMO | Admitting: Nurse Practitioner

## 2018-05-18 ENCOUNTER — Encounter (INDEPENDENT_AMBULATORY_CARE_PROVIDER_SITE_OTHER): Payer: Self-pay | Admitting: Nurse Practitioner

## 2018-05-18 VITALS — BP 152/90 | HR 85 | Resp 16 | Ht 71.0 in | Wt 263.0 lb

## 2018-05-18 DIAGNOSIS — M79604 Pain in right leg: Secondary | ICD-10-CM | POA: Diagnosis not present

## 2018-05-18 DIAGNOSIS — E118 Type 2 diabetes mellitus with unspecified complications: Secondary | ICD-10-CM | POA: Diagnosis not present

## 2018-05-18 DIAGNOSIS — E785 Hyperlipidemia, unspecified: Secondary | ICD-10-CM | POA: Diagnosis not present

## 2018-05-18 DIAGNOSIS — Z7984 Long term (current) use of oral hypoglycemic drugs: Secondary | ICD-10-CM

## 2018-05-18 DIAGNOSIS — R600 Localized edema: Secondary | ICD-10-CM

## 2018-05-18 NOTE — Progress Notes (Signed)
Subjective:    Patient ID: Christine Higgins, female    DOB: 1956-07-31, 62 y.o.   MRN: [id number] Chief Complaint  Patient presents with  . Follow-up    ultrasound    HPI  Christine Higgins is a 62 y.o. female that presents today for a swelling of her right medial thigh area.  She states that the swelling happened suddenly and has persisted.  Currently the swelling on the bilateral lower extremities is minimal.  Patient does not endorse any swelling that is worse in the evening and better in the morning.  She denies any history of deep venous thrombosis.  She denies any chest pain or shortness of breath.  She denies any fever, chills, nausea, vomiting diarrhea.  Patient underwent a lower venous reflux study today which revealed no evidence of DVT or superficial venous thrombosis.  There is no evidence of reflux in the right lower extremity, either in the deep or the superficial system.  Review of Systems: Negative Unless Checked Constitutional: [] Weight loss  [] Fever  [] Chills Cardiac: [] Chest pain   [] Chest pressure   [] Palpitations   [] Shortness of breath when laying flat   [] Shortness of breath with exertion. Vascular:  [] Pain in legs with walking   [] Pain in legs with standing  [] History of DVT   [] Phlebitis   [x] Swelling in legs   [] Varicose veins   [] Non-healing ulcers Pulmonary:   [] Uses home oxygen   [] Productive cough   [] Hemoptysis   [] Wheeze  [] COPD   [] Asthma Neurologic:  [] Dizziness   [] Seizures   [] History of stroke   [] History of TIA  [] Aphasia   [] Vissual changes   [] Weakness or numbness in arm   [] Weakness or numbness in leg Musculoskeletal:   [] Joint swelling   [] Joint pain   [] Low back pain Hematologic:  [] Easy bruising  [] Easy bleeding   [] Hypercoagulable state   [] Anemic Gastrointestinal:  [] Diarrhea   [] Vomiting  [] Gastroesophageal reflux/heartburn   [] Difficulty swallowing. Genitourinary:  [] Chronic kidney disease   [] Difficult urination  [] Frequent urination   [] Blood  in urine Skin:  [] Rashes   [] Ulcers  Psychological:  [] History of anxiety   []  History of major depression.     Objective:   Physical Exam  BP (!) 152/90   Pulse 85   Resp 16   Ht 5\' 11"  (1.803 m)   Wt 263 lb (119.3 kg)   BMI 36.68 kg/m   Past Medical History:  Diagnosis Date  . Breast mass    LEFT x 3 months per pt and palpated by physician  . Cervical disc disease   . Chronic pain syndrome   . Degenerative disc disease, lumbar   . Fibromyalgia   . Fibromyalgia   . GERD (gastroesophageal reflux disease)   . Glaucoma   . Graves disease   . Hemorrhoids   . Hyperlipidemia   . Hypertension   . Thyroid disease      Gen: WD/WN, NAD Head: [place]/AT, No temporalis wasting.  Ear/Nose/Throat: Hearing grossly intact, nares w/o erythema or drainage Eyes: PER, EOMI, sclera nonicteric.  Neck: Supple, no masses.  No JVD.  Pulmonary:  Good air movement, no use of accessory muscles.  Cardiac: RRR Vascular:  Minimal swelling bilaterally.  No venous stasis dermatitis bilaterally.  Radial:  (R)[x] Palpable  ?Non-Palpable   ?Trace             (L)[x] Palpable  [] Non-Palpable   ?trace Brachial: (R)[] Palpable  [] Non-Palpable   [] Trace               (  L)[] Palpable  [] Non-Palpable   ?trace Femoral:(R)[] Palpable  [] Non-Palpable   [] Trace              (L)[] Palpable  [] Non-Palpable   ?trace Popliteal:(R)[] Palpable  [] Non-Palpable   [] Trace               (L)[] Palpable  [] Non-Palpable   ?trace Posterior Tibial:(R)[x] Palpable  [] Non-Palpable   [] Trace                         (L)[] Palpable  [] Non-Palpable   [] Trace Dorsalis Pedis: (R)[x] Palpable  [] Non-Palpable   [] Trace                         (L)[] Palpable  [] Non-Palpable   [] Trace Gastrointestinal: soft, non-distended. No guarding/no peritoneal signs.  Musculoskeletal: Using cane on her right side.  No deformity or atrophy.  Neurologic: Pain and light touch intact in extremities.  Symmetrical.  Speech is fluent. Motor exam as listed  above. Psychiatric: Judgment intact, Mood & affect appropriate for pt's clinical situation. Dermatologic: No Venous rashes. No Ulcers Noted.  No changes consistent with cellulitis. Lymph : No Cervical lymphadenopathy, no lichenification or skin changes of chronic lymphedema.   Social History   Socioeconomic History  . Marital status: Single    Spouse name: Not on file  . Number of children: Not on file  . Years of education: Not on file  . Highest education level: Not on file  Occupational History  . Not on file  Social Needs  . Financial resource strain: Not on file  . Food insecurity:    Worry: Not on file    Inability: Not on file  . Transportation needs:    Medical: Not on file    Non-medical: Not on file  Tobacco Use  . Smoking status: Former Smoker    Last attempt to quit: 1994    Years since quitting: 25.7  . Smokeless tobacco: Never Used  Substance and Sexual Activity  . Alcohol use: No    Alcohol/week: 0.0 standard drinks  . Drug use: No  . Sexual activity: Not Currently    Birth control/protection: None  Lifestyle  . Physical activity:    Days per week: Not on file    Minutes per session: Not on file  . Stress: Not on file  Relationships  . Social connections:    Talks on phone: Not on file    Gets together: Not on file    Attends religious service: Not on file    Active member of club or organization: Not on file    Attends meetings of clubs or organizations: Not on file    Relationship status: Not on file  . Intimate partner violence:    Fear of current or ex partner: Not on file    Emotionally abused: Not on file    Physically abused: Not on file    Forced sexual activity: Not on file  Other Topics Concern  . Not on file  Social History Narrative  . Not on file    Past Surgical History:  Procedure Laterality Date  . BACK SURGERY     sumbar  . carpel tunn Right   . carpel tunnel Left   . CESAREAN SECTION    . COLONOSCOPY    .  ESOPHAGOGASTRODUODENOSCOPY (EGD) WITH PROPOFOL N/A 06/26/2017   Procedure: ESOPHAGOGASTRODUODENOSCOPY (EGD) WITH PROPOFOL;  Surgeon: Lollie Sails, MD;  Location[facility] ENDOSCOPY;  Service: Endoscopy;  Laterality: N/A;  . EXCISION MORTON'S NEUROMA Left 02/05/2017   Procedure: EXCISION MORTON'S NEUROMA;  Surgeon: Samara Deist, DPM;  Location: [facility];  Service: Podiatry;  Laterality: Left;  iva with local  . HAND SURGERY Right    scar tissue removal  . JOINT REPLACEMENT     right hip arthroplasty 08/25/15  . KNEE ARTHROPLASTY Left 08/11/2017   Procedure: COMPUTER ASSISTED TOTAL KNEE ARTHROPLASTY;  Surgeon: Dereck Leep, MD;  Location: [facility] ORS;  Service: Orthopedics;  Laterality: Left;  . NECK SURGERY     "disk implant"  . SHOULDER SURGERY Right    spur removal  . TOTAL HIP ARTHROPLASTY Right     Family History  Problem Relation Age of Onset  . Cancer Mother   . Heart disease Father   . Breast cancer Neg Hx     Allergies  Allergen Reactions  . Cephalexin Hives  . Shellfish Allergy Nausea And Vomiting  . Aspirin Nausea And Vomiting       Assessment & Plan:   1. Leg edema, right Patient underwent a lower venous reflux study today which revealed no evidence of DVT or superficial venous thrombosis.  There is no evidence of reflux in the right lower extremity, either in the deep or the superficial system.  The cause of the patient's swelling is not vascular in nature.  The patient in fact has very little swelling bilaterally.  We will defer to her primary care physician in order to do further work-up.  The patient will follow-up with [place] as needed for lower extremity swelling.  2. Controlled diabetes mellitus type 2 with complications, unspecified whether long term insulin use ([place]) Continue hypoglycemic medications as already ordered, these medications have been reviewed and there are no changes at this time.  Hgb A1C to be monitored as already arranged by  primary service   3. Hyperlipidemia, unspecified hyperlipidemia type Continue statin as ordered and reviewed, no changes at this time    Current Outpatient Medications on File Prior to Visit  Medication Sig Dispense Refill  . acetaminophen (TYLENOL) 650 MG CR tablet Take 1,300 mg by mouth 2 (two) times daily as needed for pain.    . Ascorbic Acid (VITAMIN C) 1000 MG tablet Take 1,000 mg by mouth every evening.     Marland Kitchen atorvastatin (LIPITOR) 10 MG tablet Take 10 mg by mouth daily after supper.     . Benzocaine-Calamine-Camphor (BAND-AID CALAMINE SPRAY EX) Apply 1 application topically 3 (three) times daily as needed (for pain).    . calcium carbonate (OS-CAL - DOSED IN MG OF ELEMENTAL CALCIUM) 1250 (500 Ca) MG tablet Take 1 tablet by mouth every evening.    . celecoxib (CELEBREX) 200 MG capsule Take 200 mg by mouth 2 (two) times daily.    . cholecalciferol (VITAMIN D) 1000 units tablet Take 1,000 Units by mouth every evening.    . ciclopirox (LOPROX) 0.77 % cream APPLY TO AFFECTED AREA TWICE A DAY    . clobetasol (TEMOVATE) 0.05 % external solution Apply 1 application topically 2 (two) times daily as needed (for dry scalp).    . clonazePAM (KLONOPIN) 1 MG tablet Take 1 tablet (1 mg total) by mouth at bedtime as needed (for sleep/anxiety). 14 tablet 0  . clotrimazole-betamethasone (LOTRISONE) cream Apply 1 application topically 2 (two) times daily as needed (for irritated skin).     Marland Kitchen diphenhydrAMINE (BENADRYL) 25 MG tablet Take 25 mg by mouth every 6 (six) hours as needed (for allergies.).     Marland Kitchen  DULoxetine (CYMBALTA) 30 MG capsule Take 30 mg by mouth every evening. Total dose=90 mg    . DULoxetine (CYMBALTA) 60 MG capsule Take 60 mg by mouth every evening. Total daily dose=90    . esomeprazole (NEXIUM) 40 MG capsule Take by mouth.    . gabapentin (NEURONTIN) 300 MG capsule Take 300 mg by mouth 3 (three) times daily.    . IRON PO Take by mouth.    Marland Kitchen ketoconazole (NIZORAL) 2 % cream APPLY ON  THE SKIN TWICE A DAY AS NEEDED  4  . linaclotide (LINZESS) 290 MCG CAPS capsule Take 290 mcg by mouth daily as needed.     Marland Kitchen losartan-hydrochlorothiazide (HYZAAR) 100-25 MG tablet Take 1 tablet by mouth daily.    . Menthol, Topical Analgesic, (ICY HOT EX) Apply 1 application topically 3 (three) times daily as needed (for pain.).    Marland Kitchen methimazole (TAPAZOLE) 10 MG tablet Take 10 mg by mouth daily.     . Multiple Vitamins-Minerals (ALIVE WOMENS ENERGY PO) Take 1 tablet by mouth daily.    . Probiotic Product (PROBIOTIC PO) Take 1 capsule by mouth 2 (two) times daily.    . propranolol (INDERAL) 40 MG tablet Take 40 mg by mouth 2 (two) times daily.  11  . Propylene Glycol (SYSTANE BALANCE) 0.6 % SOLN Place 1-2 drops into both eyes 3 (three) times daily as needed (for dry eyes.).    Marland Kitchen sucralfate (CARAFATE) 1 g tablet Take 1 g by mouth 2 (two) times daily.     Marland Kitchen tetrahydrozoline 0.05 % ophthalmic solution Place 1-2 drops into both eyes daily as needed (for redness.).    Marland Kitchen tiZANidine (ZANAFLEX) 4 MG tablet Take 4 mg by mouth 3 (three) times daily as needed for muscle spasms.    . traMADol (ULTRAM) 50 MG tablet Take 50 mg by mouth every 6 (six) hours as needed. for pain  0  . traZODone (DESYREL) 150 MG tablet Take 150 mg by mouth at bedtime.     . triamcinolone cream (KENALOG) 0.1 % APPLY ON THE SKIN TWICE A DAY AS NEEDED ITCHING. AVOID FACE, GROIN, UNDERARMS  0  . vitamin B-12 (CYANOCOBALAMIN) 1000 MCG tablet Take 1,000 mcg by mouth every evening.    . vitamin E 400 UNIT capsule Take 400 Units by mouth daily.    Marland Kitchen enoxaparin (LOVENOX) 30 MG/0.3ML injection Inject 0.3 mLs (30 mg total) into the skin every 12 (twelve) hours for 14 days. 28 Syringe 0  . omeprazole (PRILOSEC) 40 MG capsule Take 40 mg by mouth 2 (two) times daily.  11  . oxyCODONE (OXY IR/ROXICODONE) 5 MG immediate release tablet Take 1 tablet (5 mg total) by mouth every 3 (three) hours as needed for moderate pain ((score 4 to 6)). (Patient not  taking: Reported on 05/18/2018) 120 tablet 0  . pantoprazole (PROTONIX) 40 MG tablet Take by mouth.    . predniSONE (DELTASONE) 50 MG tablet Take one 50 mg tablet once daily for the next five days. (Patient not taking: Reported on 05/18/2018) 5 tablet 0  . sodium chloride (OCEAN) 0.65 % SOLN nasal spray Place 1-2 sprays into both nostrils daily as needed for congestion (for moisture/congestion.).    Marland Kitchen solifenacin (VESICARE) 10 MG tablet Take 1 tablet (10 mg total) by mouth daily. (Patient not taking: Reported on 05/18/2018) 30 tablet 6  . VITAMIN A PO Take 1 capsule by mouth every evening.     No current facility-administered medications on file prior to visit.  There are no Patient Instructions on file for this visit. Return if symptoms worsen or fail to improve.   Kris Hartmann, NP

## 2018-05-19 DIAGNOSIS — E119 Type 2 diabetes mellitus without complications: Secondary | ICD-10-CM | POA: Diagnosis not present

## 2018-05-19 DIAGNOSIS — R21 Rash and other nonspecific skin eruption: Secondary | ICD-10-CM | POA: Diagnosis not present

## 2018-05-19 DIAGNOSIS — M25612 Stiffness of left shoulder, not elsewhere classified: Secondary | ICD-10-CM | POA: Diagnosis not present

## 2018-05-19 DIAGNOSIS — M25511 Pain in right shoulder: Secondary | ICD-10-CM | POA: Diagnosis not present

## 2018-05-19 DIAGNOSIS — G8929 Other chronic pain: Secondary | ICD-10-CM | POA: Diagnosis not present

## 2018-05-19 DIAGNOSIS — M545 Low back pain: Secondary | ICD-10-CM | POA: Diagnosis not present

## 2018-05-22 DIAGNOSIS — M25511 Pain in right shoulder: Secondary | ICD-10-CM | POA: Diagnosis not present

## 2018-05-22 DIAGNOSIS — M25612 Stiffness of left shoulder, not elsewhere classified: Secondary | ICD-10-CM | POA: Diagnosis not present

## 2018-05-26 DIAGNOSIS — R600 Localized edema: Secondary | ICD-10-CM | POA: Diagnosis not present

## 2018-05-26 DIAGNOSIS — M25511 Pain in right shoulder: Secondary | ICD-10-CM | POA: Diagnosis not present

## 2018-05-26 DIAGNOSIS — M25612 Stiffness of left shoulder, not elsewhere classified: Secondary | ICD-10-CM | POA: Diagnosis not present

## 2018-05-29 DIAGNOSIS — M2391 Unspecified internal derangement of right knee: Secondary | ICD-10-CM | POA: Diagnosis not present

## 2018-05-29 DIAGNOSIS — M1711 Unilateral primary osteoarthritis, right knee: Secondary | ICD-10-CM | POA: Diagnosis not present

## 2018-06-02 DIAGNOSIS — N6320 Unspecified lump in the left breast, unspecified quadrant: Secondary | ICD-10-CM | POA: Diagnosis not present

## 2018-06-02 DIAGNOSIS — M8580 Other specified disorders of bone density and structure, unspecified site: Secondary | ICD-10-CM | POA: Diagnosis not present

## 2018-06-02 DIAGNOSIS — K581 Irritable bowel syndrome with constipation: Secondary | ICD-10-CM | POA: Diagnosis not present

## 2018-06-02 DIAGNOSIS — Z01419 Encounter for gynecological examination (general) (routine) without abnormal findings: Secondary | ICD-10-CM | POA: Diagnosis not present

## 2018-06-02 DIAGNOSIS — E669 Obesity, unspecified: Secondary | ICD-10-CM | POA: Diagnosis not present

## 2018-06-02 DIAGNOSIS — Z8371 Family history of colonic polyps: Secondary | ICD-10-CM | POA: Diagnosis not present

## 2018-06-02 DIAGNOSIS — N6310 Unspecified lump in the right breast, unspecified quadrant: Secondary | ICD-10-CM | POA: Diagnosis not present

## 2018-06-03 DIAGNOSIS — L304 Erythema intertrigo: Secondary | ICD-10-CM | POA: Diagnosis not present

## 2018-06-03 DIAGNOSIS — L309 Dermatitis, unspecified: Secondary | ICD-10-CM | POA: Diagnosis not present

## 2018-06-10 ENCOUNTER — Encounter: Payer: Self-pay | Admitting: Urology

## 2018-06-10 ENCOUNTER — Ambulatory Visit: Payer: Medicare HMO | Admitting: Urology

## 2018-06-12 ENCOUNTER — Other Ambulatory Visit: Payer: Self-pay | Admitting: Obstetrics and Gynecology

## 2018-06-12 DIAGNOSIS — N6320 Unspecified lump in the left breast, unspecified quadrant: Principal | ICD-10-CM

## 2018-06-12 DIAGNOSIS — N6310 Unspecified lump in the right breast, unspecified quadrant: Secondary | ICD-10-CM

## 2018-06-12 DIAGNOSIS — G8929 Other chronic pain: Secondary | ICD-10-CM | POA: Diagnosis not present

## 2018-06-12 DIAGNOSIS — M545 Low back pain: Secondary | ICD-10-CM | POA: Diagnosis not present

## 2018-06-13 NOTE — Discharge Instructions (Signed)
°  Instructions after Knee Arthroscopy  ° ° Jaquayla Hege P. Zakyra Kukuk, Jr., M.D.    ° Dept. of Orthopaedics & Sports Medicine ° [facility] ° [address] ° [place], [place]  [address] ° ° Phone: [phone]   Fax: [phone] ° ° °DIET: °• Drink plenty of non-alcoholic fluids & begin a light diet. °• Resume your normal diet the day after surgery. ° °ACTIVITY:  °• You may use crutches or a walker with weight-bearing as tolerated, unless instructed otherwise. °• You may wean yourself off of the walker or crutches as tolerated.  °• Begin doing gentle exercises. Exercising will reduce the pain and swelling, increase motion, and prevent muscle weakness.   °• Avoid strenuous activities or athletics for a minimum of 4-6 weeks after arthroscopic surgery. °• Do not drive or operate any equipment until instructed. ° °WOUND CARE:  °• Place one to two pillows under the knee the first day or two when sitting or lying.  °• Continue to use the ice packs periodically to reduce pain and swelling. °• The small incisions in your knee are closed with nylon stitches. The stitches will be removed in the office. °• The bulky dressing may be removed on the second day after surgery. DO NOT TOUCH THE STITCHES. Put a Band-Aid over each stitch. Do NOT use any ointments or creams on the incisions.  °• You may bathe or shower after the stitches are removed at the first office visit following surgery. ° °MEDICATIONS: °• You may resume your regular medications. °• Please take the pain medication as prescribed. °• Do not take pain medication on an empty stomach. °• Do not drive or drink alcoholic beverages when taking pain medications. ° °CALL THE OFFICE FOR: °• Temperature above 101 degrees °• Excessive bleeding or drainage on the dressing. °• Excessive swelling, coldness, or paleness of the toes. °• Persistent nausea and vomiting. ° °FOLLOW-UP:  °• You should have an appointment to return to the office in 7-10 days after surgery.  °  °

## 2018-06-16 DIAGNOSIS — E875 Hyperkalemia: Secondary | ICD-10-CM | POA: Diagnosis not present

## 2018-06-16 DIAGNOSIS — I10 Essential (primary) hypertension: Secondary | ICD-10-CM | POA: Diagnosis not present

## 2018-06-16 DIAGNOSIS — E119 Type 2 diabetes mellitus without complications: Secondary | ICD-10-CM | POA: Diagnosis not present

## 2018-06-17 DIAGNOSIS — M5441 Lumbago with sciatica, right side: Secondary | ICD-10-CM | POA: Diagnosis not present

## 2018-06-17 DIAGNOSIS — G8929 Other chronic pain: Secondary | ICD-10-CM | POA: Diagnosis not present

## 2018-06-17 DIAGNOSIS — M5442 Lumbago with sciatica, left side: Secondary | ICD-10-CM | POA: Diagnosis not present

## 2018-06-18 DIAGNOSIS — R32 Unspecified urinary incontinence: Secondary | ICD-10-CM | POA: Diagnosis not present

## 2018-06-19 DIAGNOSIS — Z96652 Presence of left artificial knee joint: Secondary | ICD-10-CM | POA: Diagnosis not present

## 2018-06-19 DIAGNOSIS — M25562 Pain in left knee: Secondary | ICD-10-CM | POA: Diagnosis not present

## 2018-06-22 ENCOUNTER — Inpatient Hospital Stay: Admission: RE | Admit: 2018-06-22 | Payer: Medicare HMO | Source: Ambulatory Visit

## 2018-06-23 ENCOUNTER — Other Ambulatory Visit: Payer: Medicare HMO

## 2018-06-26 ENCOUNTER — Other Ambulatory Visit: Payer: Self-pay

## 2018-06-26 ENCOUNTER — Encounter
Admission: RE | Admit: 2018-06-26 | Discharge: 2018-06-26 | Disposition: A | Discharge status: Home / Self Care | Payer: Medicare HMO | Source: Ambulatory Visit | Attending: Orthopedic Surgery | Admitting: Orthopedic Surgery

## 2018-06-26 DIAGNOSIS — Z01818 Encounter for other preprocedural examination: Secondary | ICD-10-CM | POA: Diagnosis not present

## 2018-06-26 DIAGNOSIS — I10 Essential (primary) hypertension: Secondary | ICD-10-CM | POA: Diagnosis not present

## 2018-06-26 LAB — CBC
HCT: 39.9 % (ref 36.0–46.0)
Hemoglobin: 12.8 g/dL (ref 12.0–15.0)
MCH: 27.1 pg (ref 26.0–34.0)
MCHC: 32.1 g/dL (ref 30.0–36.0)
MCV: 84.4 fL (ref 80.0–100.0)
Platelets: 195 10*3/uL (ref 150–400)
RBC: 4.73 MIL/uL (ref 3.87–5.11)
RDW: 13.7 % (ref 11.5–15.5)
WBC: 3.7 10*3/uL — ABNORMAL LOW (ref 4.0–10.5)
nRBC: 0 % (ref 0.0–0.2)

## 2018-06-26 LAB — BASIC METABOLIC PANEL
Anion gap: 7 (ref 5–15)
BUN: 10 mg/dL (ref 8–23)
CO2: 30 mmol/L (ref 22–32)
Calcium: 9.6 mg/dL (ref 8.9–10.3)
Chloride: 101 mmol/L (ref 98–111)
Creatinine, Ser: 0.65 mg/dL (ref 0.44–1.00)
GFR calc Af Amer: >60 mL/min (ref >60)
GFR calc non Af Amer: >60 mL/min (ref >60)
Glucose, Bld: 115 mg/dL — ABNORMAL HIGH (ref 70–99)
Potassium: 3.5 mmol/L (ref 3.5–5.1)
Sodium: 138 mmol/L (ref 135–145)

## 2018-06-26 NOTE — Patient Instructions (Signed)
Your procedure is scheduled on: July 01, 2018 [facility] Report to Day Surgery on the 2nd floor of the [facility]. To find out your arrival time, please call [phone] between 1PM - 3PM on: Tuesday June 30, 2018  REMEMBER: Instructions that are not followed completely may result in serious medical risk, up to and including death; or upon the discretion of your surgeon and anesthesiologist your surgery may need to be rescheduled.  Do not eat food after midnight the night before surgery.  No gum chewing, lozengers or hard candies.  You may however, drink CLEAR liquids up to 2 hours before you are scheduled to arrive for your surgery. Do not drink anything within 2 hours of the start of your surgery.  Clear liquids include: - water  - apple juice without pulp - gatorade - black coffee or tea (Do NOT add milk or creamers to the coffee or tea) Do NOT drink anything that is not on this list.  Type 1 and Type 2 diabetics should only drink water.  No Alcohol for 24 hours before or after surgery.  No Smoking including e-cigarettes for 24 hours prior to surgery.  No chewable tobacco products for at least 6 hours prior to surgery.  No nicotine patches on the day of surgery.  On the morning of surgery brush your teeth with toothpaste and water, you may rinse your mouth with mouthwash if you wish. Do not swallow any toothpaste or mouthwash.  Notify your doctor if there is any change in your medical condition (cold, fever, infection).  Do not wear jewelry, make-up, hairpins, clips or nail polish.  Do not wear lotions, powders, or perfumes.   Do not shave 48 hours prior to surgery.   Contacts and dentures may not be worn into surgery.  Do not bring valuables to the hospital, including drivers license, insurance or credit cards.  [facility] is not responsible for any belongings or valuables.   TAKE THESE MEDICATIONS THE MORNING OF  SURGERY: NEXIUM GABAPENTIN HYDRALAZINE PROPRANOLOL TIZANIDINE TRAMAOL    Use CHG Soap  as directed on instruction sheet. Use inhalers on the day   Follow recommendations from Cardiologist, Pulmonologist or PCP regarding stopping Aspirin, Coumadin, Plavix, Eliquis, Pradaxa, or Pletal.  Stop Anti-inflammatories (NSAIDS) such as Advil, Aleve, Ibuprofen, Motrin, Naproxen, Naprosyn and Aspirin based products such as Excedrin, Goodys Powder, BC Powder. (May take Tylenol or Acetaminophen if needed.)  Stop ANY OVER THE COUNTER supplements until after surgery TURMERIC, VITC VIT E, VIT A, COQ10 (May continue Vitamin D, Vitamin B, and multivitamin.CALCIUM )  Wear comfortable clothing (specific to your surgery type) to the hospital.  Plan for stool softeners for home use.  If you are being discharged the day of surgery, you will not be allowed to drive home. You will need a responsible adult to drive you home and stay with you that night.   If you are taking public transportation, you will need to have a responsible adult with you. Please confirm with your physician that it is acceptable to use public transportation.   Please call [phone] if you have any questions about these instructions.

## 2018-06-30 ENCOUNTER — Ambulatory Visit
Admission: RE | Admit: 2018-06-30 | Discharge: 2018-06-30 | Disposition: A | Discharge status: Home / Self Care | Payer: Medicare HMO | Source: Ambulatory Visit | Attending: Obstetrics and Gynecology | Admitting: Obstetrics and Gynecology

## 2018-06-30 DIAGNOSIS — R928 Other abnormal and inconclusive findings on diagnostic imaging of breast: Secondary | ICD-10-CM | POA: Diagnosis not present

## 2018-06-30 DIAGNOSIS — N6012 Diffuse cystic mastopathy of left breast: Secondary | ICD-10-CM | POA: Diagnosis not present

## 2018-06-30 DIAGNOSIS — N6320 Unspecified lump in the left breast, unspecified quadrant: Principal | ICD-10-CM

## 2018-06-30 DIAGNOSIS — N6310 Unspecified lump in the right breast, unspecified quadrant: Secondary | ICD-10-CM | POA: Insufficient documentation

## 2018-06-30 DIAGNOSIS — N6011 Diffuse cystic mastopathy of right breast: Secondary | ICD-10-CM | POA: Diagnosis not present

## 2018-07-01 ENCOUNTER — Other Ambulatory Visit: Payer: Self-pay

## 2018-07-01 ENCOUNTER — Encounter: Payer: Self-pay | Admitting: Orthopedic Surgery

## 2018-07-01 ENCOUNTER — Ambulatory Visit
Admission: RE | Admit: 2018-07-01 | Discharge: 2018-07-01 | Disposition: A | Discharge status: Home / Self Care | Payer: Medicare HMO | Source: Ambulatory Visit | Attending: Orthopedic Surgery | Admitting: Orthopedic Surgery

## 2018-07-01 DIAGNOSIS — M797 Fibromyalgia: Secondary | ICD-10-CM

## 2018-07-01 DIAGNOSIS — X58XXXA Exposure to other specified factors, initial encounter: Secondary | ICD-10-CM | POA: Diagnosis not present

## 2018-07-01 DIAGNOSIS — S83241A Other tear of medial meniscus, current injury, right knee, initial encounter: Secondary | ICD-10-CM

## 2018-07-01 DIAGNOSIS — F329 Major depressive disorder, single episode, unspecified: Secondary | ICD-10-CM

## 2018-07-01 DIAGNOSIS — Z791 Long term (current) use of non-steroidal anti-inflammatories (NSAID): Secondary | ICD-10-CM | POA: Diagnosis not present

## 2018-07-01 DIAGNOSIS — Z8261 Family history of arthritis: Secondary | ICD-10-CM | POA: Insufficient documentation

## 2018-07-01 DIAGNOSIS — Z96652 Presence of left artificial knee joint: Secondary | ICD-10-CM | POA: Insufficient documentation

## 2018-07-01 DIAGNOSIS — M1711 Unilateral primary osteoarthritis, right knee: Secondary | ICD-10-CM | POA: Diagnosis not present

## 2018-07-01 DIAGNOSIS — M5136 Other intervertebral disc degeneration, lumbar region: Secondary | ICD-10-CM | POA: Insufficient documentation

## 2018-07-01 DIAGNOSIS — S83281A Other tear of lateral meniscus, current injury, right knee, initial encounter: Secondary | ICD-10-CM

## 2018-07-01 DIAGNOSIS — Z886 Allergy status to analgesic agent status: Secondary | ICD-10-CM | POA: Insufficient documentation

## 2018-07-01 DIAGNOSIS — Z91013 Allergy to seafood: Secondary | ICD-10-CM

## 2018-07-01 DIAGNOSIS — K219 Gastro-esophageal reflux disease without esophagitis: Secondary | ICD-10-CM

## 2018-07-01 DIAGNOSIS — X500XXA Overexertion from strenuous movement or load, initial encounter: Secondary | ICD-10-CM | POA: Insufficient documentation

## 2018-07-01 DIAGNOSIS — Z79899 Other long term (current) drug therapy: Secondary | ICD-10-CM

## 2018-07-01 DIAGNOSIS — Z7951 Long term (current) use of inhaled steroids: Secondary | ICD-10-CM | POA: Insufficient documentation

## 2018-07-01 DIAGNOSIS — M25561 Pain in right knee: Secondary | ICD-10-CM | POA: Diagnosis present

## 2018-07-01 DIAGNOSIS — E669 Obesity, unspecified: Secondary | ICD-10-CM

## 2018-07-01 DIAGNOSIS — Z888 Allergy status to other drugs, medicaments and biological substances status: Secondary | ICD-10-CM | POA: Insufficient documentation

## 2018-07-01 DIAGNOSIS — Z96641 Presence of right artificial hip joint: Secondary | ICD-10-CM | POA: Insufficient documentation

## 2018-07-01 DIAGNOSIS — M1990 Unspecified osteoarthritis, unspecified site: Secondary | ICD-10-CM

## 2018-07-01 DIAGNOSIS — Z8379 Family history of other diseases of the digestive system: Secondary | ICD-10-CM | POA: Insufficient documentation

## 2018-07-01 DIAGNOSIS — S83231A Complex tear of medial meniscus, current injury, right knee, initial encounter: Secondary | ICD-10-CM | POA: Diagnosis not present

## 2018-07-01 DIAGNOSIS — Z8249 Family history of ischemic heart disease and other diseases of the circulatory system: Secondary | ICD-10-CM

## 2018-07-01 DIAGNOSIS — M94261 Chondromalacia, right knee: Secondary | ICD-10-CM | POA: Diagnosis not present

## 2018-07-01 DIAGNOSIS — G43909 Migraine, unspecified, not intractable, without status migrainosus: Secondary | ICD-10-CM

## 2018-07-01 DIAGNOSIS — E119 Type 2 diabetes mellitus without complications: Secondary | ICD-10-CM

## 2018-07-01 DIAGNOSIS — S83271A Complex tear of lateral meniscus, current injury, right knee, initial encounter: Secondary | ICD-10-CM | POA: Diagnosis not present

## 2018-07-01 DIAGNOSIS — X509XXA Other and unspecified overexertion or strenuous movements or postures, initial encounter: Secondary | ICD-10-CM

## 2018-07-01 DIAGNOSIS — E785 Hyperlipidemia, unspecified: Secondary | ICD-10-CM | POA: Diagnosis not present

## 2018-07-01 DIAGNOSIS — M2241 Chondromalacia patellae, right knee: Secondary | ICD-10-CM

## 2018-07-01 DIAGNOSIS — Z803 Family history of malignant neoplasm of breast: Secondary | ICD-10-CM

## 2018-07-01 DIAGNOSIS — E0500 Thyrotoxicosis with diffuse goiter without thyrotoxic crisis or storm: Secondary | ICD-10-CM

## 2018-07-01 DIAGNOSIS — H409 Unspecified glaucoma: Secondary | ICD-10-CM | POA: Insufficient documentation

## 2018-07-01 DIAGNOSIS — G894 Chronic pain syndrome: Secondary | ICD-10-CM | POA: Insufficient documentation

## 2018-07-01 DIAGNOSIS — Z881 Allergy status to other antibiotic agents status: Secondary | ICD-10-CM | POA: Insufficient documentation

## 2018-07-01 DIAGNOSIS — Z6838 Body mass index (BMI) 38.0-38.9, adult: Secondary | ICD-10-CM

## 2018-07-01 DIAGNOSIS — I10 Essential (primary) hypertension: Secondary | ICD-10-CM | POA: Insufficient documentation

## 2018-07-01 DIAGNOSIS — Y9389 Activity, other specified: Secondary | ICD-10-CM

## 2018-07-01 LAB — URINE DRUG SCREEN, QUALITATIVE (ARMC ONLY)
Amphetamines, Ur Screen: NOT DETECTED
Barbiturates, Ur Screen: NOT DETECTED
Benzodiazepine, Ur Scrn: NOT DETECTED
Cannabinoid 50 Ng, Ur ~~LOC~~: NOT DETECTED
Cocaine Metabolite,Ur ~~LOC~~: NOT DETECTED
MDMA (Ecstasy)Ur Screen: NOT DETECTED
Methadone Scn, Ur: NOT DETECTED
Opiate, Ur Screen: NOT DETECTED
Phencyclidine (PCP) Ur S: NOT DETECTED
Tricyclic, Ur Screen: NOT DETECTED

## 2018-07-01 MED ORDER — CELECOXIB 200 MG PO CAPS
400.0000 mg | ORAL_CAPSULE | Freq: Once | ORAL | Status: AC
  Administered 2018-07-01: 400 mg via ORAL

## 2018-07-01 MED ORDER — CELECOXIB 200 MG PO CAPS
ORAL_CAPSULE | ORAL | Status: AC
  Administered 2018-07-01: 400 mg via ORAL
  Filled 2018-07-01: qty 2

## 2018-07-01 MED ORDER — CLINDAMYCIN PHOSPHATE 900 MG/50ML IV SOLN: 900.0000 mg | INTRAVENOUS | Status: DC

## 2018-07-01 MED ORDER — CHLORHEXIDINE GLUCONATE 4 % EX LIQD: 60.0000 mL | Freq: Once | CUTANEOUS | Status: DC

## 2018-07-01 MED ORDER — LACTATED RINGERS IV SOLN
INTRAVENOUS | Status: DC
  Administered 2018-07-01: 14:00:00 via INTRAVENOUS

## 2018-07-01 MED ORDER — CLINDAMYCIN PHOSPHATE 900 MG/50ML IV SOLN
INTRAVENOUS | Status: AC
  Filled 2018-07-01: qty 50

## 2018-07-01 MED ORDER — MORPHINE SULFATE (PF) 4 MG/ML IV SOLN
INTRAVENOUS | Status: AC
  Filled 2018-07-01: qty 1

## 2018-07-01 NOTE — Anesthesia Preprocedure Evaluation (Addendum)
Anesthesia Evaluation  Patient identified by MRN, date of birth, ID band Patient awake    Reviewed: Allergy & Precautions, NPO status , Patient's Chart, lab work & pertinent test results  History of Anesthesia Complications Negative for: history of anesthetic complications  Airway Mallampati: II  TM Distance: >3 FB Neck ROM: Full    Dental  (+) Upper Dentures   Pulmonary neg sleep apnea, neg COPD, former smoker,    breath sounds clear to auscultation- rhonchi (-) wheezing      Cardiovascular Exercise Tolerance: Good hypertension, Pt. on medications (-) CAD, (-) Past MI, (-) Cardiac Stents and (-) CABG  Rhythm:Regular Rate:Normal - Systolic murmurs and - Diastolic murmurs    Neuro/Psych  Headaches, PSYCHIATRIC DISORDERS Depression    GI/Hepatic Neg liver ROS, GERD  ,  Endo/Other  diabetes  Renal/GU negative Renal ROS     Musculoskeletal  (+) Arthritis , Fibromyalgia -  Abdominal (+) + obese,   Peds  Hematology negative hematology ROS (+)   Anesthesia Other Findings    Reproductive/Obstetrics                            Anesthesia Physical Anesthesia Plan  ASA: III  Anesthesia Plan: General   Post-op Pain Management:    Induction: Intravenous  PONV Risk Score and Plan: 2 and Ondansetron, Dexamethasone and Midazolam  Airway Management Planned: Oral ETT  Additional Equipment:   Intra-op Plan:   Post-operative Plan: Extubation in OR  Informed Consent: I have reviewed the patients History and Physical, chart, labs and discussed the procedure including the risks, benefits and alternatives for the proposed anesthesia with the patient or authorized representative who has indicated his/her understanding and acceptance.     Plan Discussed with: CRNA and Anesthesiologist  Anesthesia Plan Comments:        Anesthesia Quick Evaluation

## 2018-07-01 NOTE — Progress Notes (Signed)
Pt. Rescheduled to tomorrow due to surgeons schedule . Re - instructed in pre-op measures.

## 2018-07-01 NOTE — Progress Notes (Signed)
Patient unable to provide urine. Verbal ok from anesthesia to start IV and give some fluids and try again.

## 2018-07-02 ENCOUNTER — Encounter
Admission: RE | Disposition: A | Discharge status: Home / Self Care | Payer: Self-pay | Source: Ambulatory Visit | Attending: Orthopedic Surgery

## 2018-07-02 ENCOUNTER — Other Ambulatory Visit: Payer: Self-pay

## 2018-07-02 ENCOUNTER — Ambulatory Visit: Payer: Medicare HMO | Admitting: Anesthesiology

## 2018-07-02 ENCOUNTER — Ambulatory Visit
Admission: RE | Admit: 2018-07-02 | Discharge: 2018-07-02 | Disposition: A | Discharge status: Home / Self Care | Payer: Medicare HMO | Source: Ambulatory Visit | Attending: Orthopedic Surgery | Admitting: Orthopedic Surgery

## 2018-07-02 ENCOUNTER — Encounter: Payer: Self-pay | Admitting: *Deleted

## 2018-07-02 DIAGNOSIS — M23241 Derangement of anterior horn of lateral meniscus due to old tear or injury, right knee: Secondary | ICD-10-CM | POA: Diagnosis not present

## 2018-07-02 DIAGNOSIS — E785 Hyperlipidemia, unspecified: Secondary | ICD-10-CM | POA: Insufficient documentation

## 2018-07-02 DIAGNOSIS — X58XXXA Exposure to other specified factors, initial encounter: Secondary | ICD-10-CM | POA: Insufficient documentation

## 2018-07-02 DIAGNOSIS — E119 Type 2 diabetes mellitus without complications: Secondary | ICD-10-CM | POA: Diagnosis not present

## 2018-07-02 DIAGNOSIS — Z96652 Presence of left artificial knee joint: Secondary | ICD-10-CM | POA: Diagnosis not present

## 2018-07-02 DIAGNOSIS — M1711 Unilateral primary osteoarthritis, right knee: Secondary | ICD-10-CM | POA: Diagnosis not present

## 2018-07-02 DIAGNOSIS — M23251 Derangement of posterior horn of lateral meniscus due to old tear or injury, right knee: Secondary | ICD-10-CM | POA: Diagnosis not present

## 2018-07-02 DIAGNOSIS — Z9889 Other specified postprocedural states: Secondary | ICD-10-CM

## 2018-07-02 DIAGNOSIS — Z791 Long term (current) use of non-steroidal anti-inflammatories (NSAID): Secondary | ICD-10-CM | POA: Diagnosis not present

## 2018-07-02 DIAGNOSIS — Z96641 Presence of right artificial hip joint: Secondary | ICD-10-CM | POA: Insufficient documentation

## 2018-07-02 DIAGNOSIS — M2241 Chondromalacia patellae, right knee: Secondary | ICD-10-CM | POA: Diagnosis not present

## 2018-07-02 DIAGNOSIS — K219 Gastro-esophageal reflux disease without esophagitis: Secondary | ICD-10-CM | POA: Diagnosis not present

## 2018-07-02 DIAGNOSIS — S83271A Complex tear of lateral meniscus, current injury, right knee, initial encounter: Secondary | ICD-10-CM | POA: Insufficient documentation

## 2018-07-02 DIAGNOSIS — I10 Essential (primary) hypertension: Secondary | ICD-10-CM | POA: Insufficient documentation

## 2018-07-02 DIAGNOSIS — M94261 Chondromalacia, right knee: Secondary | ICD-10-CM | POA: Insufficient documentation

## 2018-07-02 DIAGNOSIS — S83231A Complex tear of medial meniscus, current injury, right knee, initial encounter: Secondary | ICD-10-CM | POA: Insufficient documentation

## 2018-07-02 DIAGNOSIS — Z79899 Other long term (current) drug therapy: Secondary | ICD-10-CM | POA: Diagnosis not present

## 2018-07-02 DIAGNOSIS — M2391 Unspecified internal derangement of right knee: Secondary | ICD-10-CM | POA: Diagnosis not present

## 2018-07-02 DIAGNOSIS — M23221 Derangement of posterior horn of medial meniscus due to old tear or injury, right knee: Secondary | ICD-10-CM | POA: Diagnosis not present

## 2018-07-02 HISTORY — PX: KNEE ARTHROSCOPY: SHX127

## 2018-07-02 LAB — URINE DRUG SCREEN, QUALITATIVE (ARMC ONLY)
Amphetamines, Ur Screen: NOT DETECTED
Barbiturates, Ur Screen: NOT DETECTED
Benzodiazepine, Ur Scrn: NOT DETECTED
Cannabinoid 50 Ng, Ur ~~LOC~~: NOT DETECTED
Cocaine Metabolite,Ur ~~LOC~~: NOT DETECTED
MDMA (Ecstasy)Ur Screen: NOT DETECTED
Methadone Scn, Ur: NOT DETECTED
Opiate, Ur Screen: NOT DETECTED
Phencyclidine (PCP) Ur S: NOT DETECTED
Tricyclic, Ur Screen: NOT DETECTED

## 2018-07-02 LAB — GLUCOSE, CAPILLARY: Glucose-Capillary: 111 mg/dL — ABNORMAL HIGH (ref 70–99)

## 2018-07-02 SURGERY — ARTHROSCOPY, KNEE
Anesthesia: General | Site: Knee | Laterality: Right

## 2018-07-02 MED ORDER — ONDANSETRON HCL 4 MG/2ML IJ SOLN
INTRAMUSCULAR | Status: DC | PRN
  Administered 2018-07-02: 4 mg via INTRAVENOUS

## 2018-07-02 MED ORDER — LACTATED RINGERS IV SOLN
INTRAVENOUS | Status: DC | PRN
  Administered 2018-07-02: 13:00:00 via INTRAVENOUS

## 2018-07-02 MED ORDER — DEXAMETHASONE SODIUM PHOSPHATE 10 MG/ML IJ SOLN
INTRAMUSCULAR | Status: DC | PRN
  Administered 2018-07-02: 8 mg via INTRAVENOUS

## 2018-07-02 MED ORDER — FENTANYL CITRATE (PF) 100 MCG/2ML IJ SOLN
25.0000 ug | INTRAMUSCULAR | Status: AC | PRN
  Administered 2018-07-02 (×6): 25 ug via INTRAVENOUS

## 2018-07-02 MED ORDER — MORPHINE SULFATE (PF) 4 MG/ML IV SOLN
INTRAVENOUS | Status: AC
  Filled 2018-07-02: qty 1

## 2018-07-02 MED ORDER — PROPOFOL 10 MG/ML IV BOLUS
INTRAVENOUS | Status: DC | PRN
  Administered 2018-07-02: 130 mg via INTRAVENOUS
  Administered 2018-07-02: 30 mg via INTRAVENOUS
  Administered 2018-07-02: 20 mg via INTRAVENOUS

## 2018-07-02 MED ORDER — CELECOXIB 200 MG PO CAPS
ORAL_CAPSULE | ORAL | Status: AC
  Administered 2018-07-02: 400 mg via ORAL
  Filled 2018-07-02: qty 2

## 2018-07-02 MED ORDER — FENTANYL CITRATE (PF) 100 MCG/2ML IJ SOLN
INTRAMUSCULAR | Status: DC | PRN
  Administered 2018-07-02: 50 ug via INTRAVENOUS
  Administered 2018-07-02 (×3): 25 ug via INTRAVENOUS

## 2018-07-02 MED ORDER — HYDROCODONE-ACETAMINOPHEN 5-325 MG PO TABS
1.0000 | ORAL_TABLET | Freq: Once | ORAL | Status: AC
  Administered 2018-07-02: 1 via ORAL

## 2018-07-02 MED ORDER — CELECOXIB 200 MG PO CAPS
400.0000 mg | ORAL_CAPSULE | Freq: Once | ORAL | Status: AC
  Administered 2018-07-02: 400 mg via ORAL

## 2018-07-02 MED ORDER — LIDOCAINE HCL (CARDIAC) PF 100 MG/5ML IV SOSY
PREFILLED_SYRINGE | INTRAVENOUS | Status: DC | PRN
  Administered 2018-07-02: 40 mg via INTRAVENOUS

## 2018-07-02 MED ORDER — CLINDAMYCIN PHOSPHATE 900 MG/50ML IV SOLN
900.0000 mg | Freq: Once | INTRAVENOUS | Status: AC
  Administered 2018-07-02: 900 mg via INTRAVENOUS

## 2018-07-02 MED ORDER — MIDAZOLAM HCL 2 MG/2ML IJ SOLN
INTRAMUSCULAR | Status: DC | PRN
  Administered 2018-07-02 (×2): 1 mg via INTRAVENOUS

## 2018-07-02 MED ORDER — FENTANYL CITRATE (PF) 100 MCG/2ML IJ SOLN
INTRAMUSCULAR | Status: AC
  Administered 2018-07-02: 25 ug via INTRAVENOUS
  Filled 2018-07-02: qty 2

## 2018-07-02 MED ORDER — MORPHINE SULFATE 4 MG/ML IJ SOLN
INTRAMUSCULAR | Status: DC | PRN
  Administered 2018-07-02: 4 mg

## 2018-07-02 MED ORDER — CLINDAMYCIN PHOSPHATE 900 MG/50ML IV SOLN
INTRAVENOUS | Status: AC
  Filled 2018-07-02: qty 50

## 2018-07-02 MED ORDER — HYDROCODONE-ACETAMINOPHEN 5-325 MG PO TABS
ORAL_TABLET | ORAL | Status: AC
  Administered 2018-07-02: 1 via ORAL
  Filled 2018-07-02: qty 1

## 2018-07-02 MED ORDER — HYDROCODONE-ACETAMINOPHEN 5-325 MG PO TABS: 1.0000 | ORAL_TABLET | ORAL | 0 refills | Status: DC | PRN

## 2018-07-02 MED ORDER — ACETAMINOPHEN 10 MG/ML IV SOLN
INTRAVENOUS | Status: DC | PRN
  Administered 2018-07-02: 1000 mg via INTRAVENOUS

## 2018-07-02 MED ORDER — ONDANSETRON HCL 4 MG/2ML IJ SOLN: 4.0000 mg | Freq: Once | INTRAMUSCULAR | Status: DC | PRN

## 2018-07-02 MED ORDER — BUPIVACAINE-EPINEPHRINE (PF) 0.25% -1:200000 IJ SOLN
INTRAMUSCULAR | Status: DC | PRN
  Administered 2018-07-02: 5 mL
  Administered 2018-07-02: 25 mL

## 2018-07-02 MED ORDER — LACTATED RINGERS IV SOLN
Freq: Once | INTRAVENOUS | Status: AC
  Administered 2018-07-02: 12:00:00 via INTRAVENOUS

## 2018-07-02 MED ORDER — PHENYLEPHRINE HCL 10 MG/ML IJ SOLN
INTRAMUSCULAR | Status: DC | PRN
  Administered 2018-07-02 (×9): 100 ug via INTRAVENOUS

## 2018-07-02 SURGICAL SUPPLY — 30 items
15 Gator Conmed Linvatec ×1 IMPLANT
ADAPTER IRRIG TUBE 2 SPIKE SOL (ADAPTER) ×2 IMPLANT
ADPR TBG 2 SPK PMP STRL ASCP (ADAPTER) ×2
AES 50S arthroscopic energy 50 probe with suction ×1 IMPLANT
BLADE SHAVER 4.5 DBL SERAT CV (CUTTER) IMPLANT
CHLORAPREP W/TINT 26ML (MISCELLANEOUS) ×2 IMPLANT
COVER WAND RF STERILE (DRAPES) ×1 IMPLANT
CUFF TOURN 24 STER (MISCELLANEOUS) IMPLANT
CUFF TOURN 30 STER DUAL PORT (MISCELLANEOUS) ×1 IMPLANT
DRSG DERMACEA 8X12 NADH (GAUZE/BANDAGES/DRESSINGS) ×2 IMPLANT
DURAPREP 26ML APPLICATOR (WOUND CARE) ×2 IMPLANT
GAUZE SPONGE 4X4 12PLY STRL (GAUZE/BANDAGES/DRESSINGS) ×2 IMPLANT
GLOVE BIOGEL M STRL SZ7.5 (GLOVE) ×2 IMPLANT
GLOVE BIOGEL PI IND STRL 7.5 (GLOVE) IMPLANT
GLOVE BIOGEL PI INDICATOR 7.5 (GLOVE) ×4
GLOVE INDICATOR 8.0 STRL GRN (GLOVE) ×2 IMPLANT
GOWN STRL REUS W/ TWL LRG LVL3 (GOWN DISPOSABLE) ×2 IMPLANT
GOWN STRL REUS W/TWL LRG LVL3 (GOWN DISPOSABLE) ×8
IV LACTATED RINGER IRRG 3000ML (IV SOLUTION) ×8
IV LR IRRIG 3000ML ARTHROMATIC (IV SOLUTION) ×6 IMPLANT
KIT TURNOVER KIT A (KITS) ×2 IMPLANT
MANIFOLD NEPTUNE II (INSTRUMENTS) ×2 IMPLANT
PACK ARTHROSCOPY KNEE (MISCELLANEOUS) ×2 IMPLANT
SET TUBE SUCT SHAVER OUTFL 24K (TUBING) ×2 IMPLANT
SET TUBE TIP INTRA-ARTICULAR (MISCELLANEOUS) ×1 IMPLANT
SUT ETHILON 3-0 FS-10 30 BLK (SUTURE) ×2
SUTURE EHLN 3-0 FS-10 30 BLK (SUTURE) ×1 IMPLANT
TUBING ARTHRO INFLOW-ONLY STRL (TUBING) ×2 IMPLANT
WAND HAND CNTRL MULTIVAC 50 (MISCELLANEOUS) ×1 IMPLANT
WRAP KNEE W/COLD PACKS 25.5X14 (SOFTGOODS) ×2 IMPLANT

## 2018-07-02 NOTE — Discharge Instructions (Signed)
  AMBULATORY SURGERY  DISCHARGE INSTRUCTIONS   1) The drugs that you were given will stay in your system until tomorrow so for the next 24 hours you should not:  A) Drive an automobile B) Make any legal decisions C) Drink any alcoholic beverage   2) You may resume regular meals tomorrow.  Today it is better to start with liquids and gradually work up to solid foods.  You may eat anything you prefer, but it is better to start with liquids, then soup and crackers, and gradually work up to solid foods.   3) Please notify your doctor immediately if you have any unusual bleeding, trouble breathing, redness and pain at the surgery site, drainage, fever, or pain not relieved by medication.    4) Additional Instructions: TAKE A STOOL SOFTENER TWICE A DAY WHILE TAKING NARCOTIC PAIN MEDICINE TO PREVENT CONSTIPATION   Please contact your physician with any problems or Same Day Surgery at [phone], Monday through Friday 6 am to 4 pm, or [facility] [place] Main number at [phone].   

## 2018-07-02 NOTE — Anesthesia Post-op Follow-up Note (Signed)
Anesthesia QCDR form completed.        

## 2018-07-02 NOTE — Transfer of Care (Signed)
Immediate Anesthesia Transfer of Care Note  Patient: Christine Higgins  Procedure(s) Performed: ARTHROSCOPY KNEE, Medial and Lateral  Chondroplasty (Right Knee)  Patient Location: PACU  Anesthesia Type:General  Level of Consciousness: sedated  Airway & Oxygen Therapy: Patient Spontanous Breathing and Patient connected to face mask oxygen  Post-op Assessment: Report given to RN and Post -op Vital signs reviewed and stable  Post vital signs: Reviewed and stable  Last Vitals:  Vitals Value Taken Time  BP 138/83 07/02/2018  3:21 PM  Temp    Pulse 72 07/02/2018  3:24 PM  Resp 13 07/02/2018  3:24 PM  SpO2 100 % 07/02/2018  3:24 PM  Vitals shown include unvalidated device data.  Last Pain:  Vitals:   07/02/18 1143  TempSrc: Oral  PainSc: 7          Complications: No apparent anesthesia complications

## 2018-07-02 NOTE — Progress Notes (Signed)
100 ml of fluids given

## 2018-07-02 NOTE — Op Note (Signed)
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   ANESTHESIA: general  ESTIMATED BLOOD LOSS: Minimal  FLUIDS REPLACED: 800 mL of crystalloid  TOURNIQUET TIME: Used  INDICATIONS FOR SURGERY: AMAIRANY Higgins is a 62 y.o. year old female who has been seen for complaints of right knee pain. MRI demonstrated findings consistent with meniscal pathology. After discussion of the risks and benefits of surgical intervention, the patient expressed understanding of the risks benefits and agree with plans for right knee arthroscopy.   PROCEDURE IN DETAIL: The patient was brought into the operating room and, after adequate general anesthesia was achieved, a tourniquet was applied to the right thigh and the leg was placed in the leg holder. All bony prominences were well padded. The patient's right knee was cleaned and prepped with alcohol and Duraprep and draped in the usual sterile fashion. A "timeout" was performed as per usual protocol. The anticipated portal sites were injected with 0.25% Marcaine with epinephrine. An anterolateral incision was made and a cannula was inserted. A large effusion was evacuated and the knee was distended with fluid using the pump. The scope was advanced down the medial gutter into the medial compartment. Under visualization with the scope, an anteromedial portal was created and a hooked probe was  inserted. The medial meniscus was visualized and probed.  There was a tear of the posterior horn of the medial meniscus.  A flap type lesion was encountered.  The tear was debrided using meniscal punches and a 4.5 mm shaver.  Final contouring was performed using a 50 degree wand.  The articular cartilage was visualized.  There were grade 2-3 changes of chondromalacia involving both the medial femoral condyle and medial tibial plateau.  These areas were debrided and contoured using the wand.  The scope was then advanced into the intercondylar notch. The anterior cruciate ligament was visualized and probed and felt to be intact. The scope was removed from the lateral portal and reinserted via the anteromedial portal to better visualize the lateral compartment. The lateral meniscus was visualized and probed.  Complex tears of both the anterior and posterior horns of the lateral meniscus were encountered.  These tears were debrided using meniscal punches and the incisor shaver.  Final contouring was performed using the 50 degree wand.  The articular cartilage of the lateral compartment was visualized.  Grade 4 changes of chondromalacia were noted to both the lateral tibial plateau and lateral femoral condyle.  The margins were debrided and contoured using the wand.  Finally, the scope was advanced so as to visualize the patellofemoral articulation. Good patellar tracking was appreciated.  Grade 3-4 changes of chondromalacia were noted to the articular surface.  These areas were debrided using the wand.  The knee was irrigated with copius amounts of fluid and suctioned dry. The anterolateral portal was re-approximated with #3-0 nylon. A combination of 0.25% Marcaine with epinephrine and 4 mg of Morphine were injected via  the scope. The scope was removed and the anteromedial portal was re-approximated with #3-0 nylon. A sterile dressing was applied followed by application of an ice wrap.  The patient tolerated the  procedure well and was transported to the PACU in stable condition.  Elijan Googe P. Holley Bouche., M.D.

## 2018-07-02 NOTE — Progress Notes (Signed)
States that right arm hurts in  Biceps area  Nothing seen  Encouraging movement and elevated on pillows

## 2018-07-02 NOTE — Progress Notes (Signed)
Pt states she is 8.5 out of 10   Snoring and sleeps when not stimulated    Blood pressure 133/79 and heart rate 70

## 2018-07-02 NOTE — Anesthesia Procedure Notes (Signed)
Procedure Name: LMA Insertion Date/Time: 07/02/2018 1:30 PM Performed by: Allean Found, CRNA Pre-anesthesia Checklist: Patient identified, Patient being monitored, Timeout performed, Emergency Drugs available and Suction available Patient Re-evaluated:Patient Re-evaluated prior to induction Oxygen Delivery Method: Circle system utilized Preoxygenation: Pre-oxygenation with 100% oxygen Induction Type: IV induction Ventilation: Mask ventilation without difficulty LMA: LMA inserted LMA Size: 4.0 Tube type: Oral Number of attempts: 1 Placement Confirmation: positive ETCO2 and breath sounds checked- equal and bilateral Tube secured with: Tape Dental Injury: Teeth and Oropharynx as per pre-operative assessment

## 2018-07-02 NOTE — Anesthesia Postprocedure Evaluation (Signed)
Anesthesia Post Note  Patient: Christine Higgins  Procedure(s) Performed: ARTHROSCOPY KNEE, Medial and Lateral  Chondroplasty (Right Knee)  Patient location during evaluation: PACU Anesthesia Type: General Level of consciousness: awake and alert Pain management: pain level controlled Vital Signs Assessment: post-procedure vital signs reviewed and stable Respiratory status: spontaneous breathing and respiratory function stable Cardiovascular status: stable Anesthetic complications: no     Last Vitals:  Vitals:   07/02/18 1143 07/02/18 1524  BP: 122/75 138/83  Pulse: 79 72  Resp: 18 13  Temp: 36.6 C 36.7 C  SpO2: 98% 100%    Last Pain:  Vitals:   07/02/18 1143  TempSrc: Oral  PainSc: 7                  Cherity Blickenstaff K

## 2018-07-02 NOTE — H&P (Signed)
The patient has been re-examined, and the chart reviewed, and there have been no interval changes to the documented history and physical.    The risks, benefits, and alternatives have been discussed at length. The patient expressed understanding of the risks benefits and agreed with plans for surgical intervention.   P. , Jr. M.D.    

## 2018-07-03 ENCOUNTER — Encounter: Payer: Self-pay | Admitting: Orthopedic Surgery

## 2018-07-06 ENCOUNTER — Ambulatory Visit (INDEPENDENT_AMBULATORY_CARE_PROVIDER_SITE_OTHER): Payer: Medicare HMO | Admitting: Vascular Surgery

## 2018-07-06 ENCOUNTER — Encounter (INDEPENDENT_AMBULATORY_CARE_PROVIDER_SITE_OTHER): Payer: Medicare HMO

## 2018-07-06 ENCOUNTER — Encounter

## 2018-07-16 DIAGNOSIS — G8929 Other chronic pain: Secondary | ICD-10-CM | POA: Diagnosis not present

## 2018-07-16 DIAGNOSIS — M545 Low back pain: Secondary | ICD-10-CM | POA: Diagnosis not present

## 2018-07-20 ENCOUNTER — Other Ambulatory Visit: Payer: Self-pay | Admitting: Student

## 2018-07-20 DIAGNOSIS — G8929 Other chronic pain: Secondary | ICD-10-CM

## 2018-07-20 DIAGNOSIS — M545 Low back pain, unspecified: Secondary | ICD-10-CM

## 2018-07-20 DIAGNOSIS — H25011 Cortical age-related cataract, right eye: Secondary | ICD-10-CM | POA: Diagnosis not present

## 2018-07-28 DIAGNOSIS — M8588 Other specified disorders of bone density and structure, other site: Secondary | ICD-10-CM | POA: Diagnosis not present

## 2018-08-05 DIAGNOSIS — R32 Unspecified urinary incontinence: Secondary | ICD-10-CM | POA: Diagnosis not present

## 2018-08-06 ENCOUNTER — Ambulatory Visit
Admission: RE | Admit: 2018-08-06 | Discharge: 2018-08-06 | Disposition: A | Discharge status: Home / Self Care | Payer: Medicare HMO | Source: Ambulatory Visit | Attending: Student | Admitting: Student

## 2018-08-06 ENCOUNTER — Encounter (INDEPENDENT_AMBULATORY_CARE_PROVIDER_SITE_OTHER): Payer: Self-pay

## 2018-08-06 DIAGNOSIS — M545 Low back pain, unspecified: Secondary | ICD-10-CM

## 2018-08-06 DIAGNOSIS — G8929 Other chronic pain: Secondary | ICD-10-CM

## 2018-08-06 DIAGNOSIS — M4804 Spinal stenosis, thoracic region: Secondary | ICD-10-CM | POA: Diagnosis not present

## 2018-08-06 DIAGNOSIS — M5126 Other intervertebral disc displacement, lumbar region: Secondary | ICD-10-CM | POA: Insufficient documentation

## 2018-08-06 DIAGNOSIS — M48061 Spinal stenosis, lumbar region without neurogenic claudication: Secondary | ICD-10-CM | POA: Insufficient documentation

## 2018-08-06 MED ORDER — GADOBUTROL 1 MMOL/ML IV SOLN
10.0000 mL | Freq: Once | INTRAVENOUS | Status: AC | PRN
  Administered 2018-08-06: 10 mL via INTRAVENOUS

## 2018-08-13 DIAGNOSIS — M2392 Unspecified internal derangement of left knee: Secondary | ICD-10-CM | POA: Diagnosis not present

## 2018-08-13 DIAGNOSIS — J4521 Mild intermittent asthma with (acute) exacerbation: Secondary | ICD-10-CM | POA: Diagnosis not present

## 2018-08-13 DIAGNOSIS — M25519 Pain in unspecified shoulder: Secondary | ICD-10-CM | POA: Diagnosis not present

## 2018-08-13 DIAGNOSIS — M2391 Unspecified internal derangement of right knee: Secondary | ICD-10-CM | POA: Diagnosis not present

## 2018-08-13 DIAGNOSIS — Z9889 Other specified postprocedural states: Secondary | ICD-10-CM | POA: Diagnosis not present

## 2018-08-13 DIAGNOSIS — Z96652 Presence of left artificial knee joint: Secondary | ICD-10-CM | POA: Diagnosis not present

## 2018-08-27 DIAGNOSIS — R300 Dysuria: Secondary | ICD-10-CM | POA: Diagnosis not present

## 2018-08-27 DIAGNOSIS — E0500 Thyrotoxicosis with diffuse goiter without thyrotoxic crisis or storm: Secondary | ICD-10-CM | POA: Diagnosis not present

## 2018-08-27 DIAGNOSIS — E119 Type 2 diabetes mellitus without complications: Secondary | ICD-10-CM | POA: Diagnosis not present

## 2018-08-27 DIAGNOSIS — R600 Localized edema: Secondary | ICD-10-CM | POA: Diagnosis not present

## 2018-09-01 DIAGNOSIS — M48062 Spinal stenosis, lumbar region with neurogenic claudication: Secondary | ICD-10-CM | POA: Diagnosis not present

## 2018-09-01 DIAGNOSIS — G8929 Other chronic pain: Secondary | ICD-10-CM | POA: Diagnosis not present

## 2018-09-01 DIAGNOSIS — M545 Low back pain: Secondary | ICD-10-CM | POA: Diagnosis not present

## 2018-09-03 DIAGNOSIS — E0500 Thyrotoxicosis with diffuse goiter without thyrotoxic crisis or storm: Secondary | ICD-10-CM | POA: Diagnosis not present

## 2018-09-03 DIAGNOSIS — E042 Nontoxic multinodular goiter: Secondary | ICD-10-CM | POA: Diagnosis not present

## 2018-09-14 DIAGNOSIS — M47812 Spondylosis without myelopathy or radiculopathy, cervical region: Secondary | ICD-10-CM | POA: Insufficient documentation

## 2018-09-14 DIAGNOSIS — M75122 Complete rotator cuff tear or rupture of left shoulder, not specified as traumatic: Secondary | ICD-10-CM | POA: Diagnosis not present

## 2018-09-14 DIAGNOSIS — M7582 Other shoulder lesions, left shoulder: Secondary | ICD-10-CM | POA: Diagnosis not present

## 2018-09-17 ENCOUNTER — Other Ambulatory Visit: Payer: Self-pay | Admitting: Surgery

## 2018-09-17 ENCOUNTER — Other Ambulatory Visit (HOSPITAL_COMMUNITY): Payer: Self-pay | Admitting: Surgery

## 2018-09-17 DIAGNOSIS — M75122 Complete rotator cuff tear or rupture of left shoulder, not specified as traumatic: Secondary | ICD-10-CM

## 2018-09-24 ENCOUNTER — Ambulatory Visit
Admission: RE | Admit: 2018-09-24 | Discharge: 2018-09-24 | Disposition: A | Discharge status: Home / Self Care | Payer: Medicare HMO | Source: Ambulatory Visit | Attending: Surgery | Admitting: Surgery

## 2018-09-24 DIAGNOSIS — M75122 Complete rotator cuff tear or rupture of left shoulder, not specified as traumatic: Secondary | ICD-10-CM | POA: Insufficient documentation

## 2018-10-05 DIAGNOSIS — R32 Unspecified urinary incontinence: Secondary | ICD-10-CM | POA: Diagnosis not present

## 2018-10-05 DIAGNOSIS — M7582 Other shoulder lesions, left shoulder: Secondary | ICD-10-CM | POA: Diagnosis not present

## 2018-10-05 DIAGNOSIS — M75122 Complete rotator cuff tear or rupture of left shoulder, not specified as traumatic: Secondary | ICD-10-CM | POA: Diagnosis not present

## 2018-10-12 DIAGNOSIS — M2391 Unspecified internal derangement of right knee: Secondary | ICD-10-CM | POA: Diagnosis not present

## 2018-10-12 DIAGNOSIS — M1731 Unilateral post-traumatic osteoarthritis, right knee: Secondary | ICD-10-CM | POA: Diagnosis not present

## 2018-10-12 DIAGNOSIS — E119 Type 2 diabetes mellitus without complications: Secondary | ICD-10-CM | POA: Diagnosis not present

## 2018-10-12 DIAGNOSIS — Z9889 Other specified postprocedural states: Secondary | ICD-10-CM | POA: Diagnosis not present

## 2018-10-12 DIAGNOSIS — E7800 Pure hypercholesterolemia, unspecified: Secondary | ICD-10-CM | POA: Diagnosis not present

## 2018-10-12 DIAGNOSIS — M7062 Trochanteric bursitis, left hip: Secondary | ICD-10-CM | POA: Diagnosis not present

## 2018-10-12 DIAGNOSIS — M25552 Pain in left hip: Secondary | ICD-10-CM | POA: Diagnosis not present

## 2018-10-13 ENCOUNTER — Ambulatory Visit: Admit: 2018-10-13 | Payer: Medicare HMO | Admitting: Ophthalmology

## 2018-10-13 SURGERY — PHACOEMULSIFICATION, CATARACT, WITH IOL INSERTION
Anesthesia: Topical | Laterality: Left

## 2018-10-14 ENCOUNTER — Ambulatory Visit: Admission: RE | Admit: 2018-10-14 | Payer: Medicare HMO | Source: Ambulatory Visit | Admitting: Gastroenterology

## 2018-10-14 ENCOUNTER — Encounter: Admission: RE | Payer: Self-pay | Source: Ambulatory Visit

## 2018-10-14 SURGERY — COLONOSCOPY WITH PROPOFOL
Anesthesia: General

## 2018-10-20 ENCOUNTER — Ambulatory Visit: Payer: Medicare HMO | Admitting: Student in an Organized Health Care Education/Training Program

## 2018-11-16 ENCOUNTER — Telehealth: Payer: Self-pay | Admitting: *Deleted

## 2018-11-16 DIAGNOSIS — R208 Other disturbances of skin sensation: Secondary | ICD-10-CM | POA: Diagnosis not present

## 2018-11-16 DIAGNOSIS — R52 Pain, unspecified: Secondary | ICD-10-CM | POA: Diagnosis not present

## 2018-11-16 DIAGNOSIS — G5601 Carpal tunnel syndrome, right upper limb: Secondary | ICD-10-CM | POA: Diagnosis not present

## 2018-11-19 ENCOUNTER — Ambulatory Visit: Payer: Medicare HMO | Admitting: Student in an Organized Health Care Education/Training Program

## 2018-12-03 DIAGNOSIS — M7582 Other shoulder lesions, left shoulder: Secondary | ICD-10-CM | POA: Diagnosis not present

## 2018-12-03 DIAGNOSIS — E539 Vitamin B deficiency, unspecified: Secondary | ICD-10-CM | POA: Diagnosis not present

## 2018-12-03 DIAGNOSIS — E559 Vitamin D deficiency, unspecified: Secondary | ICD-10-CM | POA: Diagnosis not present

## 2018-12-03 DIAGNOSIS — R140 Abdominal distension (gaseous): Secondary | ICD-10-CM | POA: Diagnosis not present

## 2018-12-03 DIAGNOSIS — I10 Essential (primary) hypertension: Secondary | ICD-10-CM | POA: Diagnosis not present

## 2018-12-03 DIAGNOSIS — M25551 Pain in right hip: Secondary | ICD-10-CM | POA: Diagnosis not present

## 2018-12-03 DIAGNOSIS — Z96641 Presence of right artificial hip joint: Secondary | ICD-10-CM | POA: Diagnosis not present

## 2018-12-03 DIAGNOSIS — K210 Gastro-esophageal reflux disease with esophagitis: Secondary | ICD-10-CM | POA: Diagnosis not present

## 2018-12-03 DIAGNOSIS — M75122 Complete rotator cuff tear or rupture of left shoulder, not specified as traumatic: Secondary | ICD-10-CM | POA: Diagnosis not present

## 2018-12-03 DIAGNOSIS — E119 Type 2 diabetes mellitus without complications: Secondary | ICD-10-CM | POA: Diagnosis not present

## 2018-12-03 DIAGNOSIS — R1031 Right lower quadrant pain: Secondary | ICD-10-CM | POA: Diagnosis not present

## 2018-12-03 DIAGNOSIS — E7800 Pure hypercholesterolemia, unspecified: Secondary | ICD-10-CM | POA: Diagnosis not present

## 2018-12-03 DIAGNOSIS — R52 Pain, unspecified: Secondary | ICD-10-CM | POA: Diagnosis not present

## 2018-12-03 DIAGNOSIS — K581 Irritable bowel syndrome with constipation: Secondary | ICD-10-CM | POA: Diagnosis not present

## 2018-12-03 DIAGNOSIS — E538 Deficiency of other specified B group vitamins: Secondary | ICD-10-CM | POA: Diagnosis not present

## 2018-12-03 DIAGNOSIS — F325 Major depressive disorder, single episode, in full remission: Secondary | ICD-10-CM | POA: Diagnosis not present

## 2018-12-10 ENCOUNTER — Other Ambulatory Visit: Payer: Self-pay | Admitting: Gastroenterology

## 2018-12-10 DIAGNOSIS — R32 Unspecified urinary incontinence: Secondary | ICD-10-CM | POA: Diagnosis not present

## 2018-12-10 DIAGNOSIS — R1031 Right lower quadrant pain: Secondary | ICD-10-CM

## 2018-12-10 NOTE — Progress Notes (Signed)
Patient's Name: Christine Higgins  MRN: [id number]  Referring Provider: Kirk Ruths, MD  DOB: 1956/01/11  PCP: Kirk Ruths, MD  DOS: 12/14/2018  Note by: [name], MD  Service setting: Ambulatory outpatient  Specialty: Interventional Pain Management  Location: [facility] Virtual Visit  Visit type: Initial Patient Evaluation  Patient type: New Patient   Pain Management Virtual Encounter Note - Virtual Visit via [place] (real-time audio visits between healthcare provider and patient).  Patient's Phone No.:  [phone] (home); [phone] (mobile); (Preferred) [phone] [email or web address]  [name] [id number]: [phone]: [phone]  [id number] [address]hone: [phone]: [phone] Pre-screening note:  Our staff contacted Christine Higgins offered her an "in person", "face-to-face" appointment versus a telephone encounter. She indicated preferring the telephone encounter, at this time.  Primary Reason(s) for Visit: Tele-Encounter for initial evaluation of one or more chronic problems (new to examiner) potentially causing chronic pain, and posing a threat to normal musculoskeletal function. (Level of risk: High) CC: No chief complaint on file.  I contacted Christine [place] 12/14/2018 at 11:08 AM via video conference and clearly identified myself as BiGillis SantaMD. I verified that I was speaking with the correct person using two identifiers (Name and date of birth: [birth year]  Advanced Informed Consent I sought verbal advanced consent from Christine Higgins virtual visit interactions. I informed Christine Higgins possible security and privacy concerns, risks, and limitations associated with providing "not-in-person" medical evaluation and management services. I also informed  Christine Higgins the availability of "in-person" appointments. Finally, I informed her that there would be a charge for the virtual visit and that she could be  personally, fully or partially, financially responsible for it. Christine Higgins understanding and agreed to proceed.   HPI  Christine Higgins a [age].o. year old, female patient, contacted today for an initial evaluation of her chronic pain. She has Leg pain; Primary osteoarthritis of both knees; Back pain at L4-L5 level; DDD (degenerative disc disease), lumbosacral; Status post total replacement of right hip; Severe obesity (BMI 35.0-35.9 with comorbidity) (HC[facility] Right carpal tunnel syndrome; Primary osteoarthritis of right knee; Primary osteoarthritis of left knee; Pelvic pain in female; Osteoporosis; Migraines; Impingement syndrome of shoulder, left; Hyperlipidemia, unspecified; HTN, goal below 140/90; Hemorrhoids; Health care maintenance; Graves' orbitopathy; GERD (gastroesophageal reflux disease); DM II (diabetes mellitus, type II), controlled ([facility] Depression, major, in remission (HCMinid[place] Bilateral arm weakness; Atherosclerosis of abdominal aorta (HCChual[facility] Dysuria; Chronic pain syndrome; Fibromyalgia; Status post total left knee replacement; and Tachycardia on their problem list.  Pain Assessment: Location:    low back, radiation R>L.  Radiating:  yes to bilateral lower extremities, right greater than left Onset:  Many years ago, had L3-L4 decompression in 2015 Duration:   Pain throughout the day, worse in the evenings Quality:  Sharp, shooting, debilitating, weakening, pulsating, throbbing, punishing Severity:  4/10 (subjective, self-reported pain score)  Effect on ADL:  Severely limits, patient ambulates with cane and walker Timing:  Not affected by timing but usually worse when patient has been more active Modifying factors:  Improved with rest, heat, medications BP:    HR:    Onset and Duration: Gradual Cause of pain: patient states has had  falls since childhood and had first spine surgery in 1994. Severity: Getting  worse, NAS-11 at its worse: 10/10, NAS-11 at its best: 7/10, NAS-11 now: 7/10 and NAS-11 on the average: 8/10 Timing: Not influenced by the time of the day, During activity or exercise, After activity or exercise and After a period of immobility Aggravating Factors: Climbing, Prolonged standing, Twisting and Walking Alleviating Factors: Cold packs, Hot packs, Medications, Resting, Sitting, Sleeping and Warm showers or baths Associated Problems: Depression, Fatigue, Inability to concentrate, Numbness, Tingling, Pain that wakes patient up and Pain that does not allow patient to sleep Quality of Pain: Aching, Annoying, Burning, Constant, Cramping, Exhausting, Feeling of constriction, Feeling of weight, Heavy, Nagging, Stabbing and Tingling Previous Examinations or Tests: MRI scan, Nerve block, X-rays, Nerve conduction test, Orthopedic evaluation and Chiropractic evaluation Previous Treatments: Epidural steroid injections, Narcotic medications and TENS  63 year old female with history of L3-L4 decompression in 2015 who is referred from Dr. Lacinda Axon with neurosurgery given worsening back and lower extremity pain, right greater than left.  Patient does ambulate with a cane and walker.  She does participate in physical therapy exercises at home.  She describes cramping and numbness in her lower extremities.  She did see Dr. Lacinda Axon and wanted to avoid surgical intervention even though it was offered.  Patient has had lumbar epidural steroid injections in the past without significant pain relief but is interested in repeating before consideration of spinal cord stimulator trial.  Patient's nerve conduction study did reveal peripheral neuropathy.  Lumbar MRI results are below.  Patient currently is on gabapentin 300 mg 3 times daily.  Has never tried increasing the dose.  Kidney function within normal limits.  Not on chronic opioid therapy  The  patient was informed that my practice is divided into two sections: an interventional pain management section, as well as a completely separate and distinct medication management section. I explained that I have procedure days for my interventional therapies, and evaluation days for follow-ups and medication management. Because of the amount of documentation required during both, they are kept separated. This means that there is the possibility that she may be scheduled for a procedure on one day, and medication management the next. I have also informed her that because of staffing and facility limitations, I no longer take patients for medication management only. To illustrate the reasons for this, I gave the patient the example of surgeons, and how inappropriate it would be to refer a patient to his/her care, just to write for the post-surgical antibiotics on a surgery done by a different surgeon.   Because interventional pain management is my board-certified specialty, the patient was informed that joining my practice means that they are open to any and all interventional therapies. I made it clear that this does not mean that they will be forced to have any procedures done. What this means is that I believe interventional therapies to be essential part of the diagnosis and proper management of chronic pain conditions. Therefore, patients not interested in these interventional alternatives will be better served under the care of a different practitioner.  The patient was also made aware of my Comprehensive Pain Management Safety Guidelines where by joining my practice, they limit all of their nerve blocks and joint injections to those done by our practice, for as long as we are retained to manage their care.   Historic Controlled Substance Pharmacotherapy Review   Historical Monitoring: The patient  reports previous drug use. Drugs: Marijuana and Cocaine. List of all UDS Test(s): Lab Results   Component Value Date  MDMA NONE DETECTED 07/02/2018   MDMA NONE DETECTED 07/01/2018   COCAINSCRNUR NONE DETECTED 07/02/2018   COCAINSCRNUR NONE DETECTED 07/01/2018   PCPSCRNUR NONE DETECTED 07/02/2018   PCPSCRNUR NONE DETECTED 07/01/2018   THCU NONE DETECTED 07/02/2018   THCU NONE DETECTED 07/01/2018   List of other Serum/Urine Drug Screening Test(s):  Lab Results  Component Value Date   COCAINSCRNUR NONE DETECTED 07/02/2018   COCAINSCRNUR NONE DETECTED 07/01/2018   THCU NONE DETECTED 07/02/2018   THCU NONE DETECTED 07/01/2018   Historical Background Evaluation: [place] PMP: PDMP not reviewed this encounter. Six (6) year initial data search conducted.              [place] Department of public safety, offender search: Editor, commissioning Information) Non-contributory Risk Assessment Profile: Aberrant behavior: None observed or detected today Risk factors for fatal opioid overdose: None identified today Fatal overdose hazard ratio (HR): Calculation deferred Non-fatal overdose hazard ratio (HR): Calculation deferred Risk of opioid abuse or dependence: 0.7-3.0% with doses ? 36 MME/day and 6.1-26% with doses ? 120 MME/day. Substance use disorder (SUD) risk level: Moderate     Pharmacologic Plan: Non-opioid analgesic therapy offered.            Initial impression: Christine Higgins indicated having no interest in opioid therapy, at this point.  Meds   Current Outpatient Medications:  .  Acetaminophen (TYLENOL ARTHRITIS EXT RELIEF PO), Take 2 tablets by mouth every 8 (eight) hours as needed., Disp: , Rfl:  .  Ascorbic Acid (VITAMIN C) 1000 MG tablet, Take 500 mg by mouth daily. , Disp: , Rfl:  .  atorvastatin (LIPITOR) 10 MG tablet, Take 10 mg by mouth daily after supper. , Disp: , Rfl:  .  calcium carbonate (OS-CAL) 600 MG TABS tablet, Take 1 tablet by mouth every evening. , Disp: , Rfl:  .  celecoxib (CELEBREX) 200 MG capsule, Take 200 mg by mouth daily. , Disp: , Rfl:  .  Cholecalciferol (VITAMIN D3)  5000 units CAPS, Take 1,000 Units by mouth every evening. , Disp: , Rfl:  .  ciclopirox (LOPROX) 0.77 % cream, Apply 1 application topically 2 (two) times daily. , Disp: , Rfl:  .  ciclopirox (PENLAC) 8 % solution, Apply 1 application topically daily. Apply over nail and surrounding skin. Apply daily over previous coat. After seven (7) days, may remove with alcohol and continue cycle., Disp: , Rfl:  .  clobetasol cream (TEMOVATE) 2.72 %, Apply 1 application topically 2 (two) times daily., Disp: , Rfl:  .  clonazePAM (KLONOPIN) 1 MG tablet, Take 1 tablet (1 mg total) by mouth at bedtime as needed (for sleep/anxiety)., Disp: 14 tablet, Rfl: 0 .  clotrimazole-betamethasone (LOTRISONE) cream, Apply 1 application topically 2 (two) times daily as needed (for irritated skin). , Disp: , Rfl:  .  Coenzyme Q10 (COQ-10) 200 MG CAPS, Take 1 capsule by mouth daily., Disp: , Rfl:  .  Cyanocobalamin (VITAMIN B-12) 5000 MCG TBDP, Take 5,000 mcg by mouth every evening. , Disp: , Rfl:  .  diphenhydrAMINE (BENADRYL) 25 MG tablet, Take 50 mg by mouth 2 (two) times daily as needed for allergies. , Disp: , Rfl:  .  DULoxetine (CYMBALTA) 30 MG capsule, Take 30 mg by mouth every evening. Total dose=90 mg, Disp: , Rfl:  .  DULoxetine (CYMBALTA) 60 MG capsule, Take 60 mg by mouth every evening. Total daily dose=90, Disp: , Rfl:  .  esomeprazole (NEXIUM) 40 MG capsule, Take 40 mg by mouth 2 (two) times daily.,  Disp: , Rfl:  .  gabapentin (NEURONTIN) 300 MG capsule, Take 2 capsules (600 mg total) by mouth 3 (three) times daily., Disp: 180 capsule, Rfl: 2 .  hydrALAZINE (APRESOLINE) 25 MG tablet, Take 25 mg by mouth 3 (three) times daily as needed (blood pressure 160/100 or higher)., Disp: , Rfl:  .  HYDROcodone-acetaminophen (NORCO) 5-325 MG tablet, Take 1-2 tablets by mouth every 4 (four) hours as needed for moderate pain., Disp: 20 tablet, Rfl: 0 .  hydrocortisone 2.5 % cream, Apply 1 application topically 2 (two) times daily  as needed., Disp: , Rfl:  .  hydrOXYzine (ATARAX/VISTARIL) 10 MG tablet, Take 30 mg by mouth at bedtime., Disp: , Rfl:  .  linaclotide (LINZESS) 290 MCG CAPS capsule, Take 290 mcg by mouth daily before breakfast. , Disp: , Rfl:  .  losartan-hydrochlorothiazide (HYZAAR) 100-25 MG tablet, Take 1 tablet by mouth daily., Disp: , Rfl:  .  Menthol, Topical Analgesic, (ICY HOT EX), Apply 1 application topically 3 (three) times daily as needed (for pain.)., Disp: , Rfl:  .  methimazole (TAPAZOLE) 10 MG tablet, Take 10 mg by mouth daily. , Disp: , Rfl:  .  Multiple Vitamins-Minerals (ALIVE WOMENS ENERGY PO), Take 1 tablet by mouth daily., Disp: , Rfl:  .  Naphazoline-Pheniramine (OPCON-A OP), Apply 1 drop to eye 4 (four) times daily as needed., Disp: , Rfl:  .  omeprazole (PRILOSEC) 40 MG capsule, Take 40 mg by mouth daily. , Disp: , Rfl: 11 .  Prenatal MV-Min-Fe Fum-FA-DHA (PRENATAL 1 PO), Take 1 tablet by mouth daily., Disp: , Rfl:  .  Probiotic Product (PROBIOTIC PO), Take 1 capsule by mouth daily. , Disp: , Rfl:  .  propranolol (INDERAL) 40 MG tablet, Take 40 mg by mouth 2 (two) times daily., Disp: , Rfl: 11 .  Propylene Glycol (SYSTANE BALANCE) 0.6 % SOLN, Place 1 drop into both eyes 2 (two) times daily. , Disp: , Rfl:  .  sodium chloride (OCEAN) 0.65 % SOLN nasal spray, Place 1-2 sprays into both nostrils daily as needed for congestion (for moisture/congestion.)., Disp: , Rfl:  .  solifenacin (VESICARE) 10 MG tablet, Take 1 tablet (10 mg total) by mouth daily., Disp: 30 tablet, Rfl: 6 .  sucralfate (CARAFATE) 1 g tablet, Take 1 g by mouth 3 (three) times daily. , Disp: , Rfl:  .  tiZANidine (ZANAFLEX) 4 MG tablet, Take 2-4 mg by mouth 2 (two) times daily. , Disp: , Rfl:  .  traMADol (ULTRAM) 50 MG tablet, Take 50 mg by mouth every 6 (six) hours as needed for moderate pain. , Disp: , Rfl: 0 .  traZODone (DESYREL) 150 MG tablet, Take 150 mg by mouth at bedtime. , Disp: , Rfl:  .  triamcinolone cream  (KENALOG) 0.1 %, Apply 1 application topically daily as needed (rash). , Disp: , Rfl: 0 .  Turmeric 450 MG CAPS, Take 3 capsules by mouth daily., Disp: , Rfl:  .  VENTOLIN HFA 108 (90 Base) MCG/ACT inhaler, Inhale 2 puffs into the lungs every 4 (four) hours as needed for shortness of breath., Disp: , Rfl:  .  VITAMIN A PO, Take 2,400 mcg by mouth daily., Disp: , Rfl:  .  VITAMIN E PO, Take 400 Units by mouth daily. , Disp: , Rfl:   ROS  Cardiovascular: High blood pressure Pulmonary or Respiratory: No reported pulmonary signs or symptoms such as wheezing and difficulty taking a deep full breath (Asthma), difficulty blowing air out (Emphysema), coughing  up mucus (Bronchitis), persistent dry cough, or temporary stoppage of breathing during sleep Neurological: Incontinence:  Urinary Review of Past Neurological Studies:  Results for orders placed or performed during the hospital encounter of 06/09/16  MR Brain Wo Contrast   Narrative   CLINICAL DATA:  Fall x2 with head injury. Headache. Leg and arm weakness bilaterally.  EXAM: MRI HEAD WITHOUT CONTRAST  TECHNIQUE: Multiplanar, multiecho pulse sequences of the brain and surrounding structures were obtained without intravenous contrast.  COMPARISON:  Head CT from earlier today  FINDINGS: Brain: No acute infarction, hemorrhage, hydrocephalus, extra-axial collection or mass lesion. Mild mainly periventricular white matter disease is nonspecific, but usually from chronic microvascular ischemia in this patient with vascular risk factors. There is no infratentorial, juxta cortical, or corpus callosum involvement to implicate multiple sclerosis. Mild atrophy with ventriculomegaly.  Vascular: Normal flow voids.  Skull and upper cervical spine: Normal marrow signal.  Sinuses/Orbits: Symmetric proptosis with extraocular muscle thickening mainly affecting the inferior, medial, and superior rectus muscles. Inferior rectus muscles have a mildly  hyperintense T2 appearance. Patient has history of Graves disease.  IMPRESSION: 1. No acute or posttraumatic finding. 2. Mild cerebral white matter disease. Pattern is nonspecific but vascular risk factors favors chronic microvascular ischemia. 3. Findings of thyroid orbitopathy.   Electronically Signed   By: Monte Fantasia M.D.   On: 06/09/2016 18:10   CT Head Wo Contrast   Narrative   CLINICAL DATA:  Numbness in both legs and then fell and hit head yesterday.  EXAM: CT HEAD WITHOUT CONTRAST  TECHNIQUE: Contiguous axial images were obtained from the base of the skull through the vertex without intravenous contrast.  COMPARISON:  None.  FINDINGS: Brain: There is no evidence for acute hemorrhage, hydrocephalus, mass lesion, or abnormal extra-axial fluid collection. No definite CT evidence for acute infarction.  Vascular: No hyperdense vessel or unexpected calcification.  Skull: No evidence for fracture. No worrisome lytic or sclerotic lesion.  Sinuses/Orbits: The visualized paranasal sinuses and mastoid air cells are clear. Visualized portions of the globes and intraorbital fat are unremarkable.  Other: None.  IMPRESSION: No acute intracranial abnormality.   Electronically Signed   By: Misty Stanley M.D.   On: 06/09/2016 11:17    Psychological-Psychiatric: Anxiousness and Depressed Gastrointestinal: Reflux or heatburn Genitourinary: No reported renal or genitourinary signs or symptoms such as difficulty voiding or producing urine, peeing blood, non-functioning kidney, kidney stones, difficulty emptying the bladder, difficulty controlling the flow of urine, or chronic kidney disease Hematological: Brusing easily Endocrine: High thyroid and Graves's disease and borderline diabetic that is controlled by diet. Rheumatologic: Joint aches and or swelling due to excess weight (Osteoarthritis), Generalized muscle aches (Fibromyalgia) and Constant unexplained fatigue  (Chronic Fatigue Syndrome) Musculoskeletal: Negative for myasthenia gravis, muscular dystrophy, multiple sclerosis or malignant hyperthermia Work History: Disabled since 2001  Allergies  Christine Higgins is allergic to cephalexin; shellfish allergy; and aspirin.  Laboratory Chemistry  Inflammation Markers (CRP: Acute Phase) (ESR: Chronic Phase) Lab Results  Component Value Date   CRP <0.8 08/01/2017   ESRSEDRATE 11 08/01/2017                         Rheumatology Markers No results found for: RF, ANA, LABURIC, URICUR, LYMEIGGIGMAB, LYMEABIGMQN, HLAB27                      Renal Function Markers Lab Results  Component Value Date   BUN 10 06/26/2018  CREATININE 0.65 06/26/2018   GFRAA >60 06/26/2018   GFRNONAA >60 06/26/2018                             Hepatic Function Markers Lab Results  Component Value Date   AST 27 08/18/2017   ALT 23 08/18/2017   ALBUMIN 3.5 08/18/2017   ALKPHOS 110 08/18/2017   LIPASE 27 12/14/2016                        Electrolytes Lab Results  Component Value Date   NA 138 06/26/2018   K 3.5 06/26/2018   CL 101 06/26/2018   CALCIUM 9.6 06/26/2018   MG 1.9 08/27/2014                        Neuropathy Markers No results found for: VITAMINB12, FOLATE, HGBA1C, HIV                      CNS Tests No results found for: COLORCSF, APPEARCSF, RBCCOUNTCSF, WBCCSF, POLYSCSF, LYMPHSCSF, EOSCSF, PROTEINCSF, GLUCCSF, JCVIRUS, CSFOLI, IGGCSF, LABACHR, ACETBL                      Bone Pathology Markers No results found for: [place], VD125OH2TOT, [id number], [id number], 25OHVITD1, 25OHVITD2, 25OHVITD3, TESTOFREE, TESTOSTERONE                       Coagulation Parameters Lab Results  Component Value Date   INR 0.94 08/01/2017   LABPROT 12.5 08/01/2017   APTT 29 08/01/2017   PLT 195 06/26/2018                        Cardiovascular Markers Lab Results  Component Value Date   TROPONINI <0.03 06/09/2016   HGB 12.8 06/26/2018   HCT 39.9 06/26/2018                          ID Markers No results found for: LYMEIGGIGMAB, HIV                      CA Markers No results found for: CEA, CA125, LABCA2                      Endocrine Markers Lab Results  Component Value Date   TSH 5.274 (H) 08/18/2017   FREET4 0.83 08/18/2017                        Note: Lab results reviewed.  Imaging Review  Cervical Imaging: Cervical MR wo contrast:  Results for orders placed during the hospital encounter of 06/09/16  MR Cervical Spine Wo Contrast   Narrative CLINICAL DATA:  Frequent falls with arm numbness. History of neck surgery.  EXAM: MRI CERVICAL SPINE WITHOUT CONTRAST  TECHNIQUE: Multiplanar, multisequence MR imaging of the cervical spine was performed. No intravenous contrast was administered.  COMPARISON:  Cervical spine MRI 08/25/2012  FINDINGS: Alignment: Straightening without subluxation.  Vertebrae: No fracture, evidence of discitis, or bone lesion.  Cord: Normal signal and morphology.  Posterior Fossa, vertebral arteries, paraspinal tissues: Negative.  Disc levels. Degenerative changes are superimposed on congenital canal narrowing:  C2-3: Facet arthropathy with spurring on the left has progressed from prior. Mild left foraminal stenosis is newly  seen. Congenital narrowing of the spinal canal without superimposed degenerative stenosis or cord compression.  C3-4: Left more than right uncovertebral ridging. Left facet spurring. Mild left foraminal narrowing. Congenital narrowing of the spinal canal without cord compression.  C4-5: Left more than right uncovertebral ridging. Facet arthropathy, severe on the left with spurring and sclerosis. Facet arthropathy and left foraminal stenosis is progressed.  C5-6: ACDF. Degree of residual left foraminal stenosis that appears unchanged. Patent canal.  C6-7: Right eccentric disc bulging.  No impingement  C7-T1:Unremarkable.  IMPRESSION: 1. No evidence of myelopathy and no  cord compression to explain bilateral arms symptoms. 2. Asymmetric left facet arthropathy that is progressed from 2013. Advanced left foraminal stenosis at C4-5. Mild left foraminal stenosis at C2-3 and C3-4. 3. C5-6 ACDF with residual left foraminal stenosis, stable.   Electronically Signed   By: Monte Fantasia M.D.   On: 06/09/2016 18:04     Cervical CT w contrast:  Results for orders placed during the hospital encounter of 12/02/08  CT Cervical Spine W Contrast   Narrative Clinical Data:  Neck pain, back pain.  Left leg pain and numbness. Right arm pain and weakness.   MYELOGRAM CERVICAL AND LUMBAR   Technique: Lumbar puncture was performed by the attending neurosurgeon at L3-4.Following injection of intrathecal Omnipaque 300, 10 mL contrast, spine imaging in multiple projections was performed using fluoroscopy.   Comparison: MRI lumbar spine 03/2008.   Findings: Moderate multifactorial spinal stenosis at L3-4 secondary to central disc protrusion and posterior element hypertrophy as well as congenital narrowing of the spinal canal.  There is left L4 nerve root encroachment in the lateral recess.   Contrast was maneuvered into the cervical region by tilting the patient had down 10 degrees for 30 seconds.  AP lateral and oblique views demonstrate moderate stenosis at C5-6 secondary to a congenitally narrow canal and central disc protrusion.  Bilateral C6 nerve root encroachment is evident. Ventral defect is also present at C6-7 with mild effacement both C7 nerve roots   Fluoroscopy Time: 3.05   minutes   IMPRESSION: As above   CT MYELOGRAPHY CERVICAL SPINE   Technique:  CT imaging of the cervical spine was performed after intrathecal contrast administration. Multiplanar CT image reconstructions were also generated.   Comparison: None.   Findings:     C2-3: Mild congenital stenosis.  No soft disc protrusion   C3-4: Mild congenital stenosis.  No soft disc  protrusion or foraminal narrowing   C4-5: Mild congenital stenosis.  No soft disc protrusion or foraminal narrowing   C5-6: Shallow central protrusion.  No definite C6 nerve root encroachment.  Mild congenital stenosis with slight cord flattening.   C6-7: Mild central bulge.  No cord compression or nerve root encroachment   C7-T1: Negative.   IMPRESSION: Two level cervical spondylosis at C5-6 and  C6-7.  Despite the myelographic findings, there is no significant clear-cut lateralizing disc pathology on CT.  It is conceivable that the myelographic defects are related to hyperextension of the neck.   CT MYELOGRAPHY LUMBAR SPINE   Technique: CT imaging of the lumbar spine was performed after intrathecal contrast administration.  Multiplanar CT image reconstructions were also generated.   Comparison: MRI lumbar spine 03/2008   Findings:  No prevertebral or paraspinous masses. Premature atherosclerotic calcification of the aortoiliac system.   L1-2: Normal.   L2-3: Mild bulge.  No stenosis or disc protrusion.   L3-4: Congenital and acquired stenosis with posterior element hypertrophy, central disc protrusion, short  pedicles.  Bilateral L4 nerve root encroachment is present, left greater than right.   L4-5: Mild bulge.  Mild facet arthropathy.  No stenosis or disc protrusion.   L5-S1: Mild facet arthropathy.  Mild bulge.  No stenosis or disc protrusion.   Good general agreement with prior MR.   IMPRESSION: Congenital and acquired stenosis most notable at L3-4 where central disc protrusion along the posterior element hypertrophy and short pedicles combine to result in narrowing of the thecal sac and compression of both L4 nerve roots, left greater than right.  Provider: Perfecto Kingdom    Cervical DG 2-3 views:  Results for orders placed during the hospital encounter of 02/04/18  DG Cervical Spine 2-3 Views   Narrative CLINICAL DATA:  Neck pain after  fall.  EXAM: CERVICAL SPINE - 2-3 VIEW  COMPARISON:  06/09/2016  FINDINGS: Previous anterior cervical disc fusion at C5-6. The alignment of the cervical spine is normal. The vertebral body heights are well preserved. There is no fracture or dislocation identified. Ventral spurring is noted at C4.  IMPRESSION: 1. No acute findings. 2. Status post ACDF at C5-6.   Electronically Signed   By: Kerby Moors M.D.   On: 02/04/2018 20:29     Results for orders placed during the hospital encounter of 09/24/18  MR SHOULDER LEFT WO CONTRAST   Narrative CLINICAL DATA:  Chronic anterior shoulder pain and limited range of motion.  EXAM: MRI OF THE LEFT SHOULDER WITHOUT CONTRAST  TECHNIQUE: Multiplanar, multisequence MR imaging of the shoulder was performed. No intravenous contrast was administered.  COMPARISON:  Left shoulder MRI dated September 05, 2015.  FINDINGS: Rotator cuff: Full-thickness, full width tear of the supraspinatus and infraspinatus tendons. The supraspinatus tendon is retracted approximately 1.6 cm. The infraspinatus tendon is retracted approximately 3 cm. Progressive high-grade partial-thickness, articular surface tear of the subscapularis tendon. The teres minor tendon is intact.  Muscles: New moderate supraspinatus, infraspinatus, and subscapularis muscle atrophy. Mild infraspinatus muscle edema.  Biceps long head:  Completely torn.  Acromioclavicular Joint: Mild arthropathy of the acromioclavicular joint. Type II acromion. Large subacromial/subdeltoid bursal fluid.  Glenohumeral Joint: Small joint effusion with mild synovitis. Unchanged moderate diffuse cartilage thinning with subchondral cystic change in the glenoid.  Labrum:  Diffusely degenerated.  Bones:  No acute fracture or dislocation.  No focal bone lesion.  Other: None.  IMPRESSION: 1. New full-thickness, full width tears of the supraspinatus and infraspinatus tendons. 2. Progressive  high-grade partial-thickness articular surface tear of the subscapularis tendon. 3. New moderate supraspinatus, infraspinatus, and subscapularis muscle atrophy. 4. New complete tear of the intra-articular biceps tendon. 5. Unchanged moderate glenohumeral mild acromioclavicular osteoarthritis.   Electronically Signed   By: Titus Dubin M.D.   On: 09/24/2018 15:34    Shoulder-L DG:  Results for orders placed during the hospital encounter of 02/04/18  DG Shoulder Left   Narrative CLINICAL DATA:  Status post fall.  Shoulder pain.  EXAM: LEFT SHOULDER - 2+ VIEW  COMPARISON:  MR 09/05/2015  FINDINGS: There is no evidence of fracture or dislocation. Mild AC joint and moderate glenohumeral joint osteoarthritis identified. Soft tissues are unremarkable.  IMPRESSION: 1. No acute findings. 2. Osteoarthritis.   Electronically Signed   By: Kerby Moors M.D.   On: 02/04/2018 18:57    Lumbosacral Imaging: Lumbar MR wo contrast:  Results for orders placed during the hospital encounter of 06/09/16  MR LUMBAR SPINE WO CONTRAST   Narrative CLINICAL DATA:  Leg numbness and weakness bilaterally. Recent  falls. History of lumbar spine surgery  EXAM: MRI LUMBAR SPINE WITHOUT CONTRAST  TECHNIQUE: Multiplanar, multisequence MR imaging of the lumbar spine was performed. No intravenous contrast was administered.  COMPARISON:  05/16/2016  FINDINGS: Segmentation:  Standard.  Alignment:  Slight L5-S1 retrolisthesis.  Vertebrae:  No fracture, evidence of discitis, or bone lesion.  Conus medullaris: Extends to the L1 level and appears normal.  Paraspinal and other soft tissues: Mild lower lumbar epidural fat expansion without complete thecal sac effacement.  Disc levels:  T10-11: Disc narrowing and bulging with facet hypertrophy. Mild canal and bilateral foraminal stenosis.  T11-12: Disc narrowing and bulging greater to the right. Hyper trophic facet arthropathy. Advanced  right foraminal stenosis. Mild left foraminal stenosis. Mild canal narrowing.  T12-L1: Mild disc desiccation and annulus bulging. Mild facet hypertrophy.  L1-L2: Unremarkable.  L2-L3: Mild disc bulging.  No impingement  L3-L4: Disc narrowing and bulging with left foraminal protrusion. Hyper trophic facet arthropathy. Previous decompression from posterior at this level. Patent spinal canal. Left L3 foraminal and far-lateral impingement.  L4-L5: Disc narrowing and bulging greater to the left. Hyper trophic facet arthropathy. Mild bilateral foraminal narrowing without impingement. Mild subarticular recess narrowing.  L5-S1:  Mild disc bulging and facet hypertrophy.  IMPRESSION: 1. Stable compared to 05/16/2016. 2. T11-12 advanced right foraminal stenosis. 3. L3-4 left foraminal protrusion with L3 impingement. Previous posterior decompression at this level with good canal patency. 4. L4-5 mild subarticular recess and foraminal narrowings   Electronically Signed   By: Monte Fantasia M.D.   On: 06/09/2016 17:16     Results for orders placed during the hospital encounter of 08/06/18  MR Lumbar Spine W Wo Contrast   Narrative CLINICAL DATA:  Chronic left-sided low back pain.  EXAM: MRI LUMBAR SPINE WITHOUT AND WITH CONTRAST  TECHNIQUE: Multiplanar and multiecho pulse sequences of the lumbar spine were obtained without and with intravenous contrast.  CONTRAST:  10 mL Gadavist intravenous contrast.  COMPARISON:  MRI lumbar spine dated June 09, 2016.  FINDINGS: Segmentation:  Standard.  Alignment: Unchanged 2 mm anterolisthesis at T11-T12 and L3-L4. Unchanged trace retrolisthesis at L5-S1.  Vertebrae:  No fracture, evidence of discitis, or bone lesion.  Conus medullaris and cauda equina: Conus extends to the L1 level. Conus and cauda equina appear normal. No intradural enhancement.  Paraspinal and other soft tissues: Negative.  Disc levels:  T11-T12:  Unchanged small diffuse disc bulge eccentric to the right. Unchanged mild bilateral facet arthropathy. Unchanged mild spinal canal stenosis and moderate to severe right neuroforaminal stenosis.  T12-L1: Unchanged small diffuse disc bulge and mild bilateral facet arthropathy. Unchanged moderate right and mild left neuroforaminal stenosis. No spinal canal stenosis.  L1-L2:  Negative.  L2-L3: Unchanged small diffuse disc bulge and mild bilateral neuroforaminal stenosis. No spinal canal stenosis.  L3-L4: Prior posterior decompression. Unchanged diffuse disc bulge with superimposed large left foraminal disc protrusion. Unchanged severe left and mild right neuroforaminal stenosis. No spinal canal stenosis.  L4-L5: Unchanged small diffuse disc bulge with new small central and cranial disc extrusion with annular fissure. Unchanged mild bilateral facet arthropathy and prominent posterior epidural fat. Unchanged moderate spinal canal and mild left greater than right neuroforaminal stenosis.  L5-S1: Unchanged small broad-based central disc protrusion mild bilateral facet arthropathy. Unchanged mild bilateral neuroforaminal stenosis. No spinal canal stenosis.  IMPRESSION: 1. Unchanged large left foraminal disc protrusion at L3-L4 resulting in severe left neuroforaminal stenosis and impingement on the exiting left L3 nerve root. New mild right neuroforaminal stenosis  level. 2. Unchanged moderate to severe right neuroforaminal stenosis at T11-T12. 3. Unchanged moderate spinal canal stenosis at L4-L5. New small central disc extrusion and annular fissure at this level.   Electronically Signed   By: Titus Dubin M.D.   On: 08/06/2018 13:33    Results for orders placed during the hospital encounter of 05/06/16  CT LUMBAR SPINE W CONTRAST   Narrative CLINICAL DATA:  Low back pain.  BILATERAL leg pain.  EXAM: LUMBAR MYELOGRAM  FLUOROSCOPY TIME:  26 seconds corresponding to a Dose  Area Product of 252.55 Gy*m2  PROCEDURE: After thorough discussion of risks and benefits of the procedure including bleeding, infection, injury to nerves, blood vessels, adjacent structures as well as headache and CSF leak, written and oral informed consent was obtained. Consent was obtained by Dr. Rolla Flatten. Time out form was completed.  Patient was positioned prone on the fluoroscopy table. Local anesthesia was provided with 1% lidocaine without epinephrine after prepped and draped in the usual sterile fashion. Puncture was performed at L3 using a 5 inches 22-gauge spinal needle via RIGHT paramedian approach. Using a single pass through the dura, the needle was placed within the thecal sac, with return of clear CSF. 15 mL of Isovue-M 200 was injected into the thecal sac, with normal opacification of the nerve roots and cauda equina consistent with free flow within the subarachnoid space.  I personally performed the lumbar puncture and administered the intrathecal contrast. I also personally supervised acquisition of the myelogram images.  TECHNIQUE: Contiguous axial images were obtained through the Lumbar spine after the intrathecal infusion of infusion. Coronal and sagittal reconstructions were obtained of the axial image sets.  COMPARISON:  Previous myelogram preoperative 12/02/2008. Most recent MR 03/13/2016.  FINDINGS: LUMBAR MYELOGRAM FINDINGS:  Good opacification lumbar subarachnoid space. Prior L3 laminectomy appears uncomplicated. Anatomic alignment with the patient prone for myelography.  Prominent ventral defect at L4-5 with BILATERAL L5 nerve root cut off, with resultant severe stenosis.  Moderate stenosis at L5-S1, with effacement of both S1 nerve roots.  Extradural defect at L3-4 on the RIGHT is worse with patient standing. Also with patient standing extradural defect at L4-5 on the LEFT as well as overall stenosis, is worse.  With patient standing, 5  mm anterolisthesis at L3-4 develops in neutral. This increases to 7 mm in flexion. This decreases to 4 mm in extension.  CT LUMBAR MYELOGRAM FINDINGS:  Segmentation: Normal.  Alignment: Normal. Anterolisthesis at L3-4 noted on upright radiographs normalizes with patient supine for CT  Vertebrae: No worrisome osseous lesion.Short pedicles.  Conus medullaris: Normal in size,  and location.  Paraspinal tissues: No evidence for hydronephrosis or paravertebral mass. Aortic atherosclerosis.  Disc levels:  L1-L2:  Normal  L2-L3: Central protrusion. Mild facet arthropathy. No impingement.  L3-L4: Central and leftward protrusion extends to the foramina. Central laminectomy. LEFT greater than RIGHT L4 and L3 nerve root impingement.  L4-L5: Severe stenosis. Moderate-size central protrusion. Posterior element hypertrophy. BILATERAL foraminal narrowing. BILATERAL L4 and L5 nerve root impingement, worse on the LEFT.  L5-S1: Advanced facet arthropathy. Broad-based disc protrusion. No significant subarticular zone narrowing. BILATERAL foraminal narrowing LEFT greater than RIGHT could affect either L5 nerve root.  Compared with prior MR, good general agreement. Correlating with myelography, I do not see significant pathology at L3-4 on the RIGHT to explain the apparent defect on upright films.  IMPRESSION: LUMBAR MYELOGRAM IMPRESSION:  Severe multifactorial spinal stenosis at L4-5, worse with patient standing.  Dynamic instability at L3-4,  site of prior laminectomy, increasing to 7 mm anterolisthesis with patient standing in flexion.  CT LUMBAR MYELOGRAM IMPRESSION:  Severe stenosis at L4-5 is multifactorial, related to moderate-size central protrusion and posterior element hypertrophy. BILATERAL subarticular zone and foraminal zone narrowing.  Central and leftward protrusion at L3-4, with adequate posterior decompression. LEFT greater than RIGHT L4 and L3 nerve  root impingement are noted however.  Advanced facet arthropathy at L5-S1 conjunction with a broad-based disc protrusion contributes to potentially symptomatic foraminal narrowing affecting the LEFT greater than RIGHT L5 nerve roots.  Aortic atherosclerosis.   Electronically Signed   By: Staci Righter M.D.   On: 05/06/2016 15:05    Lumbar DG 1V:  Results for orders placed during the hospital encounter of 10/11/09  DG Lumbar Spine 1 View   Narrative Clinical Data: L3-4 laminectomy.   LUMBAR SPINE - 1 VIEW   Comparison: CT lumbar spine 12/02/2008.   Findings: We are provided with a single intraoperative view of the lumbar spine in the lateral projection.  Image demonstrates a blunt metallic probe at the level of the superior margin of the L4 pedicles.   IMPRESSION: Localization as above.  Provider: Mila Palmer    Results for orders placed during the hospital encounter of 04/14/16  DG Lumbar Spine 2-3 Views   Narrative CLINICAL DATA:  Patient states fell August 3rd. Worsening low back pain since that time.  EXAM: LUMBAR SPINE - 2-3 VIEW  COMPARISON:  03/26/2016.  FINDINGS: There is no evidence of lumbar spine fracture. Alignment is normal. Intervertebral disc spaces are maintained. Vascular calcification. Slight endplate irregularity C94 and T12, without definite compressive deformity, similar to priors. Previous RIGHT total hip arthroplasty.  IMPRESSION: Negative for lumbar fracture.   Electronically Signed   By: Staci Righter M.D.   On: 04/14/2016 16:49     Results for orders placed during the hospital encounter of 05/06/16  DG MYELOGRAPHY LUMBAR INJ LUMBOSACRAL   Narrative CLINICAL DATA:  Low back pain.  BILATERAL leg pain.  EXAM: LUMBAR MYELOGRAM  FLUOROSCOPY TIME:  26 seconds corresponding to a Dose Area Product of 252.55 Gy*m2  PROCEDURE: After thorough discussion of risks and benefits of the procedure including bleeding, infection, injury  to nerves, blood vessels, adjacent structures as well as headache and CSF leak, written and oral informed consent was obtained. Consent was obtained by Dr. Rolla Flatten. Time out form was completed.  Patient was positioned prone on the fluoroscopy table. Local anesthesia was provided with 1% lidocaine without epinephrine after prepped and draped in the usual sterile fashion. Puncture was performed at L3 using a 5 inches 22-gauge spinal needle via RIGHT paramedian approach. Using a single pass through the dura, the needle was placed within the thecal sac, with return of clear CSF. 15 mL of Isovue-M 200 was injected into the thecal sac, with normal opacification of the nerve roots and cauda equina consistent with free flow within the subarachnoid space.  I personally performed the lumbar puncture and administered the intrathecal contrast. I also personally supervised acquisition of the myelogram images.  TECHNIQUE: Contiguous axial images were obtained through the Lumbar spine after the intrathecal infusion of infusion. Coronal and sagittal reconstructions were obtained of the axial image sets.  COMPARISON:  Previous myelogram preoperative 12/02/2008. Most recent MR 03/13/2016.  FINDINGS: LUMBAR MYELOGRAM FINDINGS:  Good opacification lumbar subarachnoid space. Prior L3 laminectomy appears uncomplicated. Anatomic alignment with the patient prone for myelography.  Prominent ventral defect at L4-5 with BILATERAL L5 nerve root  cut off, with resultant severe stenosis.  Moderate stenosis at L5-S1, with effacement of both S1 nerve roots.  Extradural defect at L3-4 on the RIGHT is worse with patient standing. Also with patient standing extradural defect at L4-5 on the LEFT as well as overall stenosis, is worse.  With patient standing, 5 mm anterolisthesis at L3-4 develops in neutral. This increases to 7 mm in flexion. This decreases to 4 mm in extension.  CT LUMBAR MYELOGRAM  FINDINGS:  Segmentation: Normal.  Alignment: Normal. Anterolisthesis at L3-4 noted on upright radiographs normalizes with patient supine for CT  Vertebrae: No worrisome osseous lesion.Short pedicles.  Conus medullaris: Normal in size,  and location.  Paraspinal tissues: No evidence for hydronephrosis or paravertebral mass. Aortic atherosclerosis.  Disc levels:  L1-L2:  Normal  L2-L3: Central protrusion. Mild facet arthropathy. No impingement.  L3-L4: Central and leftward protrusion extends to the foramina. Central laminectomy. LEFT greater than RIGHT L4 and L3 nerve root impingement.  L4-L5: Severe stenosis. Moderate-size central protrusion. Posterior element hypertrophy. BILATERAL foraminal narrowing. BILATERAL L4 and L5 nerve root impingement, worse on the LEFT.  L5-S1: Advanced facet arthropathy. Broad-based disc protrusion. No significant subarticular zone narrowing. BILATERAL foraminal narrowing LEFT greater than RIGHT could affect either L5 nerve root.  Compared with prior MR, good general agreement. Correlating with myelography, I do not see significant pathology at L3-4 on the RIGHT to explain the apparent defect on upright films.  IMPRESSION: LUMBAR MYELOGRAM IMPRESSION:  Severe multifactorial spinal stenosis at L4-5, worse with patient standing.  Dynamic instability at L3-4, site of prior laminectomy, increasing to 7 mm anterolisthesis with patient standing in flexion.  CT LUMBAR MYELOGRAM IMPRESSION:  Severe stenosis at L4-5 is multifactorial, related to moderate-size central protrusion and posterior element hypertrophy. BILATERAL subarticular zone and foraminal zone narrowing.  Central and leftward protrusion at L3-4, with adequate posterior decompression. LEFT greater than RIGHT L4 and L3 nerve root impingement are noted however.  Advanced facet arthropathy at L5-S1 conjunction with a broad-based disc protrusion contributes to potentially  symptomatic foraminal narrowing affecting the LEFT greater than RIGHT L5 nerve roots.  Aortic atherosclerosis.   Electronically Signed   By: Staci Righter M.D.   On: 05/06/2016 15:05    Knee-R MR wo contrast:  Results for orders placed during the hospital encounter of 05/17/18  MR KNEE RIGHT WO CONTRAST   Narrative CLINICAL DATA:  Acute right knee pain with painful range of motion.  EXAM: MRI OF THE RIGHT KNEE WITHOUT CONTRAST  TECHNIQUE: Multiplanar, multisequence MR imaging of the knee was performed. No intravenous contrast was administered.  COMPARISON:  None.  FINDINGS: MENISCI  Medial meniscus: There is a complex tear of the posterior horn including a longitudinal peripheral tear best seen on series 16. There is blunting of the adjacent free edge.  Lateral meniscus: There is a complex tear of the anterior horn including a horizontal component extending into the midbody. There is hypertrophy and degeneration of the posterior horn.  LIGAMENTS  Cruciates:  Degenerated but intact.  Collaterals:  Normal.  CARTILAGE  Patellofemoral: Complete denuding of the articular cartilage of the patella and trochlear groove. Marked irregularity of the articular surface of the patella. Very shallow trochlear groove.  Medial: Focal thinning of the articular cartilage of the posterior aspect of the tibial plateau and of the posterior aspect of the femoral condyle.  Lateral: Full-thickness cartilage loss of the posterior central aspect of the tibial plateau and of the posterior central aspect of the femoral condyle.  Joint: Large joint effusion with extensive debris in the joint, particularly in the suprapatellar recess.  Popliteal Fossa: No Baker's cyst. However, there are extensive complex cysts posterior to the distal femur in the midline and posterior to the posterior cruciate ligament. Intact popliteus tendon.  Extensor Mechanism:  Intact.  Bones: Prominent  tricompartmental marginal osteophytes. Complex 15 mm subcortical cyst in the posterior lateral aspect of the medial tibial plateau with a smaller adjacent cyst in the posteromedial aspect of the lateral tibial plateau.  Other: None  IMPRESSION: 1. Severe osteoarthritis of the patellofemoral and lateral compartments. 2. Complex tears of the anterior horn and midbody of the lateral meniscus and of the posterior horn of the medial meniscus. 3. Large joint effusion with extensive debris in the joint. 4. Multiple complex ganglion cysts posterior to the knee joint in the midline.   Electronically Signed   By: Lorriane Shire M.D.   On: 05/18/2018 08:09    Complexity Note: Imaging results reviewed. Results shared with Christine Higgins, using Layman's terms.                         [place]  Drug: Christine Higgins  reports previous drug use. Drugs: Marijuana and Cocaine. Alcohol:  reports no history of alcohol use. Tobacco:  reports that she quit smoking about 26 years ago. She has never used smokeless tobacco. Medical:  has a past medical history of Breast mass, Cervical disc disease, Chronic pain syndrome, Degenerative disc disease, lumbar, Fibromyalgia, Fibromyalgia, GERD (gastroesophageal reflux disease), Glaucoma, Graves disease, Hemorrhoids, Hyperlipidemia, Hypertension, and Thyroid disease. Family: family history includes Cancer in her mother; Heart disease in her father.  Past Surgical History:  Procedure Laterality Date  . BACK SURGERY     sumbar  . carpel tunn Right   . carpel tunnel Left   . CESAREAN SECTION    . COLONOSCOPY    . ESOPHAGOGASTRODUODENOSCOPY (EGD) WITH PROPOFOL N/A 06/26/2017   Procedure: ESOPHAGOGASTRODUODENOSCOPY (EGD) WITH PROPOFOL;  Surgeon: Lollie Sails, MD;  Location: [facility] ENDOSCOPY;  Service: Endoscopy;  Laterality: N/A;  . EXCISION MORTON'S NEUROMA Left 02/05/2017   Procedure: EXCISION MORTON'S NEUROMA;  Surgeon: Samara Deist, DPM;  Location: [place];  Service: Podiatry;  Laterality: Left;  iva with local  . HAND SURGERY Right    scar tissue removal  . JOINT REPLACEMENT     right hip arthroplasty 08/25/15  . KNEE ARTHROPLASTY Left 08/11/2017   Procedure: COMPUTER ASSISTED TOTAL KNEE ARTHROPLASTY;  Surgeon: Dereck Leep, MD;  Location: [facility] ORS;  Service: Orthopedics;  Laterality: Left;  . KNEE ARTHROSCOPY Right 07/02/2018   Procedure: ARTHROSCOPY KNEE, Medial and Lateral  Chondroplasty;  Surgeon: Dereck Leep, MD;  Location: [facility] ORS;  Service: Orthopedics;  Laterality: Right;  . NECK SURGERY     "disk implant"  . SHOULDER SURGERY Right    spur removal  . TOTAL HIP ARTHROPLASTY Right    Active Ambulatory Problems    Diagnosis Date Noted  . Leg pain 07/28/2015  . Primary osteoarthritis of both knees 07/28/2015  . Back pain at L4-L5 level 10/30/2015  . DDD (degenerative disc disease), lumbosacral 10/30/2015  . Status post total replacement of right hip 08/21/2015  . Severe obesity (BMI 35.0-35.9 with comorbidity) ([place]) 08/09/2014  . Right carpal tunnel syndrome 03/27/2017  . Primary osteoarthritis of right knee 10/18/2016  . Primary osteoarthritis of left knee 10/18/2016  . Pelvic pain in female 06/24/2017  . Osteoporosis 02/03/2014  .  Migraines 02/03/2014  . Impingement syndrome of shoulder, left 10/20/2014  . Hyperlipidemia, unspecified 02/03/2014  . HTN, goal below 140/90 03/05/2017  . Hemorrhoids 03/14/2014  . Health care maintenance 06/21/2015  . Graves' orbitopathy 02/17/2017  . GERD (gastroesophageal reflux disease) 02/03/2014  . DM II (diabetes mellitus, type II), controlled ([place]) 04/19/2014  . Depression, major, in remission ([place]) 12/21/2015  . Bilateral arm weakness 07/16/2016  . Atherosclerosis of abdominal aorta ([place]) 07/29/2014  . Dysuria 06/24/2017  . Chronic pain syndrome 07/30/2017  . Fibromyalgia 07/30/2017  . Status post total left knee replacement 08/11/2017  . Tachycardia 08/18/2017    Resolved Ambulatory Problems    Diagnosis Date Noted  . No Resolved Ambulatory Problems   Past Medical History:  Diagnosis Date  . Breast mass   . Cervical disc disease   . Degenerative disc disease, lumbar   . Glaucoma   . Graves disease   . Hyperlipidemia   . Hypertension   . Thyroid disease    Assessment  Primary Diagnosis & Pertinent Problem List: The primary encounter diagnosis was History of lumbar laminectomy for spinal cord decompression (L3-L4, 2015). Diagnoses of S/P cervical spinal fusion, Status post total left knee replacement, Fibromyalgia, Chronic pain syndrome, Primary osteoarthritis of both knees, Back pain at L4-L5 level, Primary osteoarthritis of left knee, Primary osteoarthritis of right knee, DDD (degenerative disc disease), lumbosacral, Controlled diabetes mellitus type 2 with complications, unspecified whether long term insulin use ([place]), and Lumbar radiculopathy were also pertinent to this visit.  Visit Diagnosis (New problems to examiner): 1. History of lumbar laminectomy for spinal cord decompression (L3-L4, 2015)   2. S/P cervical spinal fusion   3. Status post total left knee replacement   4. Fibromyalgia   5. Chronic pain syndrome   6. Primary osteoarthritis of both knees   7. Back pain at L4-L5 level   8. Primary osteoarthritis of left knee   9. Primary osteoarthritis of right knee   10. DDD (degenerative disc disease), lumbosacral   11. Controlled diabetes mellitus type 2 with complications, unspecified whether long term insulin use ([place])   12. 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  History of lumbar decompression at L3-L4 in 2015.  Now is having pain radiation down both legs, right greater than left and difficulty walking.  Has been evaluated by Dr. Lacinda Axon with neurosurgery.  Recommended spinal cord stimulator trial.  Patient  wants to hold off on implantable's at the moment but this could be a consideration going forward.  We discussed performing a series of lumbar epidural steroid injections to help out with her lumbar radicular pain.  If this is not effective, can consider more advanced therapies such as SCS.  Risks and benefits of lumbar epidural steroid injection were discussed with the patient in great detail.  Patient would like to proceed.  Patient denies being on any blood thinners.  I also recommend the patient increase her gabapentin for her neuropathic pain.  Current dose is 300 mg 3 times a day and I recommended patient increase her dose in a stepwise fashion to 600 mg 3 times a day.  Kidney function within normal limits. Plan of Care (Initial workup plan)    Procedure Orders     Lumbar Epidural Injection Pharmacotherapy (current): Medications ordered:  Meds ordered this encounter  Medications  . gabapentin (NEURONTIN) 300 MG capsule    Sig: Take  2 capsules (600 mg total) by mouth 3 (three) times daily.    Dispense:  180 capsule    Refill:  2   Medications administered during this visit: Christine Higgins had no medications administered during this visit.     Interventional management options: Ms. Podgurski was informed that there is no guarantee that she would be a candidate for interventional therapies. The decision will be based on the results of diagnostic studies, as well as Christine Higgins risk profile.  Procedure(s) under consideration:  Series of lumbar epidural steroid injection Spinal cord stimulator trial Sacroiliac joint injection Lumbar facet medial branch nerve blocks and possible lumbar RFA   Provider-requested follow-up: Return for Procedure L4-L5 ESI with sedation.  No future appointments.  Primary Care Physician: Kirk Ruths, MD Location: [facility] Note by: [name], MD Date: 12/14/2018; Time: 11:08 AM

## 2018-12-11 ENCOUNTER — Other Ambulatory Visit: Payer: Self-pay | Admitting: Gastroenterology

## 2018-12-14 ENCOUNTER — Other Ambulatory Visit: Payer: Self-pay

## 2018-12-14 ENCOUNTER — Ambulatory Visit
Payer: Medicare HMO | Attending: Student in an Organized Health Care Education/Training Program | Admitting: Student in an Organized Health Care Education/Training Program

## 2018-12-14 DIAGNOSIS — E118 Type 2 diabetes mellitus with unspecified complications: Secondary | ICD-10-CM

## 2018-12-14 DIAGNOSIS — G894 Chronic pain syndrome: Secondary | ICD-10-CM

## 2018-12-14 DIAGNOSIS — M545 Low back pain, unspecified: Secondary | ICD-10-CM

## 2018-12-14 DIAGNOSIS — M5416 Radiculopathy, lumbar region: Secondary | ICD-10-CM

## 2018-12-14 DIAGNOSIS — M170 Bilateral primary osteoarthritis of knee: Secondary | ICD-10-CM

## 2018-12-14 DIAGNOSIS — Z9889 Other specified postprocedural states: Secondary | ICD-10-CM

## 2018-12-14 DIAGNOSIS — M1712 Unilateral primary osteoarthritis, left knee: Secondary | ICD-10-CM

## 2018-12-14 DIAGNOSIS — Z981 Arthrodesis status: Secondary | ICD-10-CM

## 2018-12-14 DIAGNOSIS — Z96652 Presence of left artificial knee joint: Secondary | ICD-10-CM

## 2018-12-14 DIAGNOSIS — M51379 Other intervertebral disc degeneration, lumbosacral region without mention of lumbar back pain or lower extremity pain: Secondary | ICD-10-CM

## 2018-12-14 DIAGNOSIS — M1711 Unilateral primary osteoarthritis, right knee: Secondary | ICD-10-CM | POA: Diagnosis not present

## 2018-12-14 DIAGNOSIS — M797 Fibromyalgia: Secondary | ICD-10-CM | POA: Diagnosis not present

## 2018-12-14 DIAGNOSIS — M5137 Other intervertebral disc degeneration, lumbosacral region: Secondary | ICD-10-CM

## 2018-12-14 MED ORDER — GABAPENTIN 300 MG PO CAPS: 600.0000 mg | ORAL_CAPSULE | Freq: Three times a day (TID) | ORAL | 2 refills | Status: DC

## 2018-12-23 DIAGNOSIS — G894 Chronic pain syndrome: Secondary | ICD-10-CM | POA: Diagnosis not present

## 2018-12-25 ENCOUNTER — Other Ambulatory Visit: Payer: Self-pay

## 2018-12-25 ENCOUNTER — Ambulatory Visit
Admission: RE | Admit: 2018-12-25 | Discharge: 2018-12-25 | Disposition: A | Discharge status: Home / Self Care | Payer: Medicare HMO | Source: Ambulatory Visit | Attending: Gastroenterology | Admitting: Gastroenterology

## 2018-12-25 DIAGNOSIS — R1031 Right lower quadrant pain: Secondary | ICD-10-CM | POA: Diagnosis not present

## 2018-12-25 MED ORDER — IOHEXOL 300 MG/ML  SOLN
100.0000 mL | Freq: Once | INTRAMUSCULAR | Status: AC | PRN
  Administered 2018-12-25: 09:00:00 100 mL via INTRAVENOUS

## 2019-01-01 DIAGNOSIS — R21 Rash and other nonspecific skin eruption: Secondary | ICD-10-CM | POA: Diagnosis not present

## 2019-01-01 DIAGNOSIS — M1990 Unspecified osteoarthritis, unspecified site: Secondary | ICD-10-CM | POA: Diagnosis not present

## 2019-01-07 DIAGNOSIS — R21 Rash and other nonspecific skin eruption: Secondary | ICD-10-CM | POA: Diagnosis not present

## 2019-01-07 DIAGNOSIS — E0500 Thyrotoxicosis with diffuse goiter without thyrotoxic crisis or storm: Secondary | ICD-10-CM | POA: Diagnosis not present

## 2019-01-07 DIAGNOSIS — Z6841 Body Mass Index (BMI) 40.0 and over, adult: Secondary | ICD-10-CM | POA: Diagnosis not present

## 2019-02-02 DIAGNOSIS — R21 Rash and other nonspecific skin eruption: Secondary | ICD-10-CM | POA: Diagnosis not present

## 2019-02-02 DIAGNOSIS — M2550 Pain in unspecified joint: Secondary | ICD-10-CM | POA: Diagnosis not present

## 2019-02-02 DIAGNOSIS — M791 Myalgia, unspecified site: Secondary | ICD-10-CM | POA: Diagnosis not present

## 2019-02-02 DIAGNOSIS — M1731 Unilateral post-traumatic osteoarthritis, right knee: Secondary | ICD-10-CM | POA: Diagnosis not present

## 2019-02-02 DIAGNOSIS — Z114 Encounter for screening for human immunodeficiency virus [HIV]: Secondary | ICD-10-CM | POA: Diagnosis not present

## 2019-02-16 DIAGNOSIS — M159 Polyosteoarthritis, unspecified: Secondary | ICD-10-CM | POA: Insufficient documentation

## 2019-02-16 DIAGNOSIS — R21 Rash and other nonspecific skin eruption: Secondary | ICD-10-CM | POA: Diagnosis not present

## 2019-02-16 DIAGNOSIS — D708 Other neutropenia: Secondary | ICD-10-CM | POA: Insufficient documentation

## 2019-02-16 DIAGNOSIS — D709 Neutropenia, unspecified: Secondary | ICD-10-CM | POA: Insufficient documentation

## 2019-02-16 DIAGNOSIS — M8949 Other hypertrophic osteoarthropathy, multiple sites: Secondary | ICD-10-CM | POA: Diagnosis not present

## 2019-02-17 ENCOUNTER — Other Ambulatory Visit: Admission: RE | Admit: 2019-02-17 | Payer: Medicare HMO | Source: Ambulatory Visit

## 2019-02-22 ENCOUNTER — Ambulatory Visit: Payer: Medicare HMO | Admitting: Student in an Organized Health Care Education/Training Program

## 2019-02-25 ENCOUNTER — Ambulatory Visit
Payer: Medicare HMO | Attending: Student in an Organized Health Care Education/Training Program | Admitting: Student in an Organized Health Care Education/Training Program

## 2019-02-25 ENCOUNTER — Encounter: Payer: Self-pay | Admitting: Student in an Organized Health Care Education/Training Program

## 2019-02-25 ENCOUNTER — Other Ambulatory Visit: Payer: Self-pay

## 2019-02-25 DIAGNOSIS — Z9889 Other specified postprocedural states: Secondary | ICD-10-CM

## 2019-02-25 DIAGNOSIS — Z96652 Presence of left artificial knee joint: Secondary | ICD-10-CM

## 2019-02-25 DIAGNOSIS — M5137 Other intervertebral disc degeneration, lumbosacral region: Secondary | ICD-10-CM | POA: Diagnosis not present

## 2019-02-25 DIAGNOSIS — M545 Low back pain, unspecified: Secondary | ICD-10-CM

## 2019-02-25 DIAGNOSIS — E118 Type 2 diabetes mellitus with unspecified complications: Secondary | ICD-10-CM | POA: Diagnosis not present

## 2019-02-25 DIAGNOSIS — M5416 Radiculopathy, lumbar region: Secondary | ICD-10-CM | POA: Diagnosis not present

## 2019-02-25 DIAGNOSIS — M51379 Other intervertebral disc degeneration, lumbosacral region without mention of lumbar back pain or lower extremity pain: Secondary | ICD-10-CM

## 2019-02-25 DIAGNOSIS — G894 Chronic pain syndrome: Secondary | ICD-10-CM | POA: Diagnosis not present

## 2019-02-25 NOTE — Progress Notes (Deleted)
I attempted to call the patient however no response. Voicemail left instructing patient to call front desk office at [phone] to reschedule appointment. -Dr Kamyrah Feeser  

## 2019-02-25 NOTE — Progress Notes (Signed)
Pain Management Virtual Encounter Note - Virtual Visit via Telephone Telehealth (real-time audio visits between healthcare provider and patient).   Patient's Phone No. & Preferred Pharmacy:  [phone] (home); [phone] (mobile); (Preferred) [phone] [email or web address]  [name], [place] [address] Phone: [phone] Fax: [phone]  [id number] [name] [address] [place] [address] Phone: [phone] Fax: [phone]    Pre-screening note:  Our staff contacted Christine Higgins and offered her an "in person", "face-to-face" appointment versus a telephone encounter. She indicated preferring the telephone encounter, at this time.   Reason for Virtual Visit: COVID-19*  Social distancing based on CDC and AMA recommendations.   I contacted Christine Higgins on 02/25/2019 via telephone.      I clearly identified myself as [name], MD. I verified that I was speaking with the correct person using two identifiers (Name: Christine Higgins, and date of birth: 1956/04/13).  Advanced Informed Consent I sought verbal advanced consent from Christine Higgins for virtual visit interactions. I informed Christine Higgins of possible security and privacy concerns, risks, and limitations associated with providing "not-in-person" medical evaluation and management services. I also informed Christine Higgins of the availability of "in-person" appointments. Finally, I informed her that there would be a charge for the virtual visit and that she could be  personally, fully or partially, financially responsible for it. Christine Higgins expressed understanding and agreed to proceed.   Historic Elements   Christine Higgins is a 63 y.o. year old, female patient evaluated today after her last encounter by our practice on 12/14/2018. Christine Higgins  has a past medical history of Breast mass, Cervical disc disease, Chronic pain syndrome,  Degenerative disc disease, lumbar, Fibromyalgia, Fibromyalgia, GERD (gastroesophageal reflux disease), Glaucoma, Graves disease, Hemorrhoids, Hyperlipidemia, Hypertension, and Thyroid disease. She also  has a past surgical history that includes carpel tunn (Right); Hand surgery (Right); carpel tunnel (Left); Cesarean section; Shoulder surgery (Right); Back surgery; Neck surgery; Total hip arthroplasty (Right); Excision Morton's neuroma (Left, 02/05/2017); Colonoscopy; Joint replacement; Esophagogastroduodenoscopy (egd) with propofol (N/A, 06/26/2017); Knee Arthroplasty (Left, 08/11/2017); and Knee arthroscopy (Right, 07/02/2018). Christine Higgins has a current medication list which includes the following prescription(s): acetaminophen, vitamin c, atorvastatin, calcium carbonate, celecoxib, vitamin d3, ciclopirox, ciclopirox, clobetasol cream, clonazepam, clotrimazole-betamethasone, coq-10, vitamin b-12, diphenhydramine, duloxetine, duloxetine, esomeprazole, gabapentin, hydralazine, hydrocortisone, hydroxyzine, linaclotide, losartan-hydrochlorothiazide, menthol (topical analgesic), methimazole, multiple vitamins-minerals, naphazoline-pheniramine, omeprazole, prenatal mv-min-fe fum-fa-dha, probiotic product, propranolol, propylene glycol, sodium chloride, solifenacin, tizanidine, tramadol, trazodone, triamcinolone cream, turmeric, ventolin hfa, vitamin a, vitamin e, and hydrocodone-acetaminophen. She  reports that she quit smoking about 26 years ago. She has never used smokeless tobacco. She reports previous drug use. Drugs: Marijuana and Cocaine. She reports that she does not drink alcohol. Christine Higgins is allergic to cephalexin; shellfish allergy; and aspirin.   HPI  Today, she is being contacted for worsening of previously known (established) problem   From initial note 63 year old female with history of L3-L4 decompression in 2015 who is referred from Dr. Lacinda Axon with neurosurgery given worsening back and lower extremity  pain, right greater than left.  Patient does ambulate with a cane and Christine.  She does participate in physical therapy exercises at home.  She describes cramping and numbness in her lower extremities.  She did see Dr. Lacinda Axon and wanted to avoid surgical intervention even though it was offered.  Patient has had lumbar epidural steroid injections in  the past without significant pain relief but is interested in repeating before consideration of spinal cord stimulator trial.  Patient's nerve conduction study did reveal peripheral neuropathy.  Lumbar MRI results are below.    Patient endorses worsening low back and leg pain.  She states that her pain is severe at her tailbone with radiation down to bilateral legs.  We discussed lumbar epidural steroid injection to help with her symptoms of lumbar radicular pain.  Patient does have left L3 nerve root compression.  Can plan for L4-L5 L5-S1 interlaminar steroid injection.  Patient's lumbar MRI shows T11-12 thoracic foraminal stenosis which may preclude her from spinal cord stimulator trial.  I informed the patient that during the course of her epidural, I will evaluate her thoracic interlaminar windows to assess if electrode placement will be suitable.  Patient endorsed understanding.  Pertinent Labs   SAFETY SCREENING Profile Lab Results  Component Value Date   STAPHAUREUS NEGATIVE 08/01/2017   MRSAPCR NEGATIVE 08/01/2017   Renal Function Lab Results  Component Value Date   BUN 10 06/26/2018   CREATININE 0.65 06/26/2018   GFRAA >60 06/26/2018   GFRNONAA >60 06/26/2018   Hepatic Function Lab Results  Component Value Date   AST 27 08/18/2017   ALT 23 08/18/2017   ALBUMIN 3.5 08/18/2017   UDS No results found for: SUMMARY Note: Above Lab results reviewed.  Recent imaging  CT ABDOMEN PELVIS W CONTRAST CLINICAL DATA:  Right lower quadrant pain with radiation to right groin and back for 4 months. Twisting injury 3 weeks ago. Right total hip  arthroplasty. Right pelvic pain.  EXAM: CT ABDOMEN AND PELVIS WITH CONTRAST  TECHNIQUE: Multidetector CT imaging of the abdomen and pelvis was performed using the standard protocol following bolus administration of intravenous contrast.  CONTRAST:  120mL OMNIPAQUE IOHEXOL 300 MG/ML  SOLN  COMPARISON:  04/22/2017  FINDINGS: Lower chest: Clear lung bases. Normal heart size without pericardial or pleural effusion.  Hepatobiliary: Normal liver. Normal gallbladder, without biliary ductal dilatation.  Pancreas: Normal, without mass or ductal dilatation.  Spleen: Normal in size, without focal abnormality.  Adrenals/Urinary Tract: Normal adrenal glands. Normal kidneys, without hydronephrosis. Degraded evaluation of the pelvis, secondary to beam hardening artifact from right hip arthroplasty. Grossly normal urinary bladder.  Stomach/Bowel: Normal stomach, without wall thickening. Colonic stool burden suggests constipation. Normal terminal ileum and appendix. Normal small bowel.  Vascular/Lymphatic: Aortic and branch vessel atherosclerosis. No abdominopelvic adenopathy.  Reproductive: Normal uterus and adnexa.  Other: No significant free fluid.  Musculoskeletal: Right hip arthroplasty. Thoracolumbar spondylosis, including degenerative disc disease at L3-4.  IMPRESSION: 1. No acute process in the abdomen or pelvis. No explanation for right lower quadrant pain. 2. Possible constipation.  Aortic Atherosclerosis (ICD10-I70.0).  Electronically Signed   By: Abigail Miyamoto M.D.   On: 12/25/2018 10:45  Assessment  The primary encounter diagnosis was History of lumbar laminectomy for spinal cord decompression (L3-L4, 2015). Diagnoses of Status post total left knee replacement, Chronic pain syndrome, Back pain at L4-L5 level, DDD (degenerative disc disease), lumbosacral, Controlled diabetes mellitus type 2 with complications, unspecified whether long term insulin use (Christine Higgins), and  Lumbar radiculopathy were also pertinent to this visit.  Plan of Care  I have discontinued Carol A. Higgins sucralfate. I am also having her maintain her clotrimazole-betamethasone, traZODone, methimazole, DULoxetine, atorvastatin, vitamin C, diphenhydrAMINE, celecoxib, DULoxetine, linaclotide, losartan-hydrochlorothiazide, tiZANidine, VITAMIN E PO, Probiotic Product (PROBIOTIC PO), Multiple Vitamins-Minerals (ALIVE WOMENS ENERGY PO), omeprazole, calcium carbonate, Vitamin D3, Vitamin B-12, (Menthol, Topical  Analgesic, (ICY HOT EX)), Propylene Glycol, sodium chloride, clonazePAM, traMADol, solifenacin, ciclopirox, propranolol, triamcinolone cream, ciclopirox, hydrALAZINE, hydrOXYzine, Ventolin HFA, Turmeric, VITAMIN A PO, CoQ-10, Acetaminophen (TYLENOL ARTHRITIS EXT RELIEF PO), esomeprazole, hydrocortisone, clobetasol cream, Naphazoline-Pheniramine (OPCON-A OP), Prenatal MV-Min-Fe Fum-FA-DHA (PRENATAL 1 PO), HYDROcodone-acetaminophen, and gabapentin.  Low back and bilateral lower extremity leg pain and weakness related to failed back surgical syndrome, lumbar postlaminectomy pain syndrome.  History of lumbar decompression at L3-L4 in 2015.  Now is having pain radiation down both legs, right greater than left and difficulty walking.  Has been evaluated by Dr. Lacinda Axon with neurosurgery.  Recommended spinal cord stimulator trial.  Patient wants to hold off on implantable's at the moment but this could be a consideration going forward.  We discussed performing a series of lumbar epidural steroid injections to help out with her lumbar radicular pain.  If this is not effective, can consider more advanced therapies such as SCS.  Risks and benefits of lumbar epidural steroid injection were discussed with the patient in great detail.  Patient would like to proceed.  Patient denies being on any blood thinners.  Orders:  Orders Placed This Encounter  Procedures  . Lumbar Epidural Injection    Standing Status:   Future     Standing Expiration Date:   03/28/2019    Scheduling Instructions:     Procedure: Interlaminar Lumbar Epidural Steroid injection (LESI)        L4/5 or L5/S1     Laterality: Midline     Sedation: without     Timeframe: ASAA    Order Specific Question:   Where will this procedure be performed?    Answer:   [facility]   Follow-up plan:   Return for Procedure L5/S1 ESI w/o sedation .   Recent Visits Date Type Provider Dept  12/14/18 Office Visit [name], MD [facility]  Showing recent visits within past 90 days and meeting all other requirements   Today's Visits Date Type Provider Dept  02/25/19 Office Visit [name], MD [facility]  Showing today's visits and meeting all other requirements   Future Appointments No visits were found meeting these conditions.  Showing future appointments within next 90 days and meeting all other requirements   I discussed the assessment and treatment plan with the patient. The patient was provided an opportunity to ask questions and all were answered. The patient agreed with the plan and demonstrated an understanding of the instructions.  Patient advised to call back or seek an in-person evaluation if the symptoms or condition worsens.  Total duration of non-face-to-face encounter: 25 minutes.  Time Note: Greater than 50% of the 25 minute(s) of nonface-to-face time spent with Christine Higgins, was spent in counseling/coordination of care regarding: Christine Higgins primary cause of pain, the treatment plan, treatment alternatives, the risks and possible complications of proposed treatment, going over the informed consent, realistic expectations and the goals of pain management (increased in functionality).  Note by: [name], MD Date: 02/25/2019; Time: 1:40 PM  Note: This dictation was prepared with Dragon dictation. Any transcriptional errors that may result from this process are unintentional.  Disclaimer:  * Given  the special circumstances of the COVID-19 pandemic, the federal government has announced that the Office for Civil Rights (OCR) will exercise its enforcement discretion and will not impose penalties on physicians using telehealth in the event of noncompliance with regulatory requirements under the [place] and [place] (HIPAA) in connection with the good  faith provision of telehealth during the FJUVQ-22 national public health emergency. (AMA)

## 2019-03-04 DIAGNOSIS — T7840XA Allergy, unspecified, initial encounter: Secondary | ICD-10-CM | POA: Diagnosis not present

## 2019-03-04 DIAGNOSIS — L304 Erythema intertrigo: Secondary | ICD-10-CM | POA: Diagnosis not present

## 2019-03-04 DIAGNOSIS — L929 Granulomatous disorder of the skin and subcutaneous tissue, unspecified: Secondary | ICD-10-CM | POA: Diagnosis not present

## 2019-03-04 DIAGNOSIS — R21 Rash and other nonspecific skin eruption: Secondary | ICD-10-CM | POA: Diagnosis not present

## 2019-03-04 DIAGNOSIS — D485 Neoplasm of uncertain behavior of skin: Secondary | ICD-10-CM | POA: Diagnosis not present

## 2019-03-04 DIAGNOSIS — E0500 Thyrotoxicosis with diffuse goiter without thyrotoxic crisis or storm: Secondary | ICD-10-CM | POA: Diagnosis not present

## 2019-03-05 ENCOUNTER — Other Ambulatory Visit: Admission: RE | Admit: 2019-03-05 | Payer: Medicare HMO | Source: Ambulatory Visit

## 2019-03-10 ENCOUNTER — Ambulatory Visit: Payer: Medicare HMO | Admitting: Student in an Organized Health Care Education/Training Program

## 2019-03-10 ENCOUNTER — Other Ambulatory Visit: Payer: Self-pay

## 2019-03-10 ENCOUNTER — Other Ambulatory Visit
Admission: RE | Admit: 2019-03-10 | Discharge: 2019-03-10 | Disposition: A | Discharge status: Home / Self Care | Payer: Medicare HMO | Source: Ambulatory Visit | Attending: Student in an Organized Health Care Education/Training Program | Admitting: Student in an Organized Health Care Education/Training Program

## 2019-03-10 DIAGNOSIS — Z1159 Encounter for screening for other viral diseases: Secondary | ICD-10-CM | POA: Insufficient documentation

## 2019-03-10 DIAGNOSIS — E042 Nontoxic multinodular goiter: Secondary | ICD-10-CM | POA: Diagnosis not present

## 2019-03-10 DIAGNOSIS — E0500 Thyrotoxicosis with diffuse goiter without thyrotoxic crisis or storm: Secondary | ICD-10-CM | POA: Diagnosis not present

## 2019-03-10 LAB — SARS CORONAVIRUS 2 (TAT 6-24 HRS): SARS Coronavirus 2: NEGATIVE

## 2019-03-15 ENCOUNTER — Encounter: Payer: Self-pay | Admitting: Student in an Organized Health Care Education/Training Program

## 2019-03-15 ENCOUNTER — Other Ambulatory Visit: Payer: Self-pay

## 2019-03-15 ENCOUNTER — Ambulatory Visit
Admission: RE | Admit: 2019-03-15 | Discharge: 2019-03-15 | Disposition: A | Discharge status: Home / Self Care | Payer: Medicare HMO | Source: Ambulatory Visit | Attending: Student in an Organized Health Care Education/Training Program | Admitting: Student in an Organized Health Care Education/Training Program

## 2019-03-15 ENCOUNTER — Ambulatory Visit (HOSPITAL_BASED_OUTPATIENT_CLINIC_OR_DEPARTMENT_OTHER): Payer: Medicare HMO | Admitting: Student in an Organized Health Care Education/Training Program

## 2019-03-15 VITALS — BP 132/84 | HR 83 | Temp 98.3°F | Resp 15 | Ht 71.0 in | Wt 260.0 lb

## 2019-03-15 DIAGNOSIS — M5416 Radiculopathy, lumbar region: Secondary | ICD-10-CM | POA: Diagnosis not present

## 2019-03-15 MED ORDER — DEXAMETHASONE SODIUM PHOSPHATE 10 MG/ML IJ SOLN
10.0000 mg | Freq: Once | INTRAMUSCULAR | Status: AC
  Administered 2019-03-15: 10 mg
  Filled 2019-03-15: qty 1

## 2019-03-15 MED ORDER — SODIUM CHLORIDE 0.9% FLUSH
1.0000 mL | Freq: Once | INTRAVENOUS | Status: AC
  Administered 2019-03-15: 9 mL

## 2019-03-15 MED ORDER — LIDOCAINE HCL 2 % IJ SOLN
20.0000 mL | Freq: Once | INTRAMUSCULAR | Status: AC
  Administered 2019-03-15: 400 mg
  Filled 2019-03-15: qty 20

## 2019-03-15 MED ORDER — ROPIVACAINE HCL 2 MG/ML IJ SOLN
1.0000 mL | Freq: Once | INTRAMUSCULAR | Status: AC
  Administered 2019-03-15: 10 mL via EPIDURAL
  Filled 2019-03-15: qty 10

## 2019-03-15 MED ORDER — SODIUM CHLORIDE (PF) 0.9 % IJ SOLN
INTRAMUSCULAR | Status: AC
  Filled 2019-03-15: qty 10

## 2019-03-15 MED ORDER — IOHEXOL 180 MG/ML  SOLN
10.0000 mL | Freq: Once | INTRAMUSCULAR | Status: AC
  Administered 2019-03-15: 10 mL via EPIDURAL

## 2019-03-15 NOTE — Progress Notes (Signed)
Patient's Name: Christine Higgins  MRN: [id number]  Referring Provider: Kirk Ruths, MD  DOB: Jan 22, 1956  PCP: Kirk Ruths, MD  DOS: 03/15/2019  Note by: [name], MD  Service setting: Ambulatory outpatient  Specialty: Interventional Pain Management  Patient type: Established  Location: [facility] (AMB) Pain Management Facility  Visit type: Interventional Procedure   Primary Reason for Visit: Interventional Pain Management Treatment. CC: Back Pain (lower)  Procedure:          Anesthesia, Analgesia, Anxiolysis:  Type: Diagnostic Inter-Laminar Epidural Steroid Injection  #1  Region: Lumbar Level: L5-S1 Level. Laterality: Right-Sided         Type: Local Anesthesia  Local Anesthetic: Lidocaine 1-2%  Position: Prone with head of the table was raised to facilitate breathing.   Indications: 1. Lumbar radiculopathy    Pain Score: Pre-procedure: 7 /10 Post-procedure: 0-No pain/10   Pre-op Assessment:  Christine Higgins is a 63 y.o. (year old), female patient, seen today for interventional treatment. She  has a past surgical history that includes carpel tunn (Right); Hand surgery (Right); carpel tunnel (Left); Cesarean section; Shoulder surgery (Right); Back surgery; Neck surgery; Total hip arthroplasty (Right); Excision Morton's neuroma (Left, 02/05/2017); Colonoscopy; Joint replacement; Esophagogastroduodenoscopy (egd) with propofol (N/A, 06/26/2017); Knee Arthroplasty (Left, 08/11/2017); and Knee arthroscopy (Right, 07/02/2018). Christine Higgins has a current medication list which includes the following prescription(s): acetaminophen, vitamin c, atorvastatin, calcium carbonate, celecoxib, vitamin d3, ciclopirox, ciclopirox, clobetasol cream, clonazepam, clotrimazole-betamethasone, coq-10, vitamin b-12, diphenhydramine, duloxetine, duloxetine, esomeprazole, gabapentin, hydralazine, hydrocortisone, hydroxyzine, linaclotide, losartan-hydrochlorothiazide, menthol (topical analgesic), methimazole, multiple  vitamins-minerals, naphazoline-pheniramine, omeprazole, prenatal mv-min-fe fum-fa-dha, probiotic product, propranolol, propylene glycol, sodium chloride, solifenacin, tizanidine, tramadol, trazodone, triamcinolone cream, turmeric, ventolin hfa, vitamin a, vitamin e, and hydrocodone-acetaminophen. Her primarily concern today is the Back Pain (lower)  Initial Vital Signs:  Pulse/HCG Rate: 83ECG Heart Rate: 64 Temp: 98.3 F (36.8 C) Resp: 16 BP: 130/74 SpO2: 98 %  BMI: Estimated body mass index is 36.26 kg/m as calculated from the following:   Height as of this encounter: 5\' 11"  (1.803 m).   Weight as of this encounter: 260 lb (117.9 kg).  Risk Assessment: Allergies: Reviewed. She is allergic to cephalexin; shellfish allergy; and aspirin.  Allergy Precautions: None required Coagulopathies: Reviewed. None identified.  Blood-thinner therapy: None at this time Active Infection(s): Reviewed. None identified. Christine Higgins is afebrile  Site Confirmation: Christine Higgins was asked to confirm the procedure and laterality before marking the site Procedure checklist: Completed Consent: Before the procedure and under the influence of no sedative(s), amnesic(s), or anxiolytics, the patient was informed of the treatment options, risks and possible complications. To fulfill our ethical and legal obligations, as recommended by the American Medical Association's Code of Ethics, I have informed the patient of my clinical impression; the nature and purpose of the treatment or procedure; the risks, benefits, and possible complications of the intervention; the alternatives, including doing nothing; the risk(s) and benefit(s) of the alternative treatment(s) or procedure(s); and the risk(s) and benefit(s) of doing nothing. The patient was provided information about the general risks and possible complications associated with the procedure. These may include, but are not limited to: failure to achieve desired goals,  infection, bleeding, organ or nerve damage, allergic reactions, paralysis, and death. In addition, the patient was informed of those risks and complications associated to Spine-related procedures, such as failure to decrease pain; infection (i.e.: Meningitis, epidural or intraspinal abscess); bleeding (i.e.: epidural hematoma, subarachnoid hemorrhage, or any other type of intraspinal or  peri-dural bleeding); organ or nerve damage (i.e.: Any type of peripheral nerve, nerve root, or spinal cord injury) with subsequent damage to sensory, motor, and/or autonomic systems, resulting in permanent pain, numbness, and/or weakness of one or several areas of the body; allergic reactions; (i.e.: anaphylactic reaction); and/or death. Furthermore, the patient was informed of those risks and complications associated with the medications. These include, but are not limited to: allergic reactions (i.e.: anaphylactic or anaphylactoid reaction(s)); adrenal axis suppression; blood sugar elevation that in diabetics may result in ketoacidosis or comma; water retention that in patients with history of congestive heart failure may result in shortness of breath, pulmonary edema, and decompensation with resultant heart failure; weight gain; swelling or edema; medication-induced neural toxicity; particulate matter embolism and blood vessel occlusion with resultant organ, and/or nervous system infarction; and/or aseptic necrosis of one or more joints. Finally, the patient was informed that Medicine is not an exact science; therefore, there is also the possibility of unforeseen or unpredictable risks and/or possible complications that may result in a catastrophic outcome. The patient indicated having understood very clearly. We have given the patient no guarantees and we have made no promises. Enough time was given to the patient to ask questions, all of which were answered to the patient's satisfaction. Christine Higgins has indicated that she  wanted to continue with the procedure. Attestation: I, the ordering provider, attest that I have discussed with the patient the benefits, risks, side-effects, alternatives, likelihood of achieving goals, and potential problems during recovery for the procedure that I have provided informed consent. Date  Time: 03/15/2019  1:04 PM  Pre-Procedure Preparation:  Monitoring: As per clinic protocol. Respiration, ETCO2, SpO2, BP, heart rate and rhythm monitor placed and checked for adequate function Safety Precautions: Patient was assessed for positional comfort and pressure points before starting the procedure. Time-out: I initiated and conducted the "Time-out" before starting the procedure, as per protocol. The patient was asked to participate by confirming the accuracy of the "Time Out" information. Verification of the correct person, site, and procedure were performed and confirmed by me, the nursing staff, and the patient. "Time-out" conducted as per Joint Commission's Universal Protocol (UP.01.01.01). Time: 1441  Description of Procedure:          Target Area: The interlaminar space, initially targeting the lower laminar border of the superior vertebral body. Approach: Paramedial approach. Area Prepped: Entire Posterior Lumbar Region Prepping solution: DuraPrep (Iodine Povacrylex [0.7% available iodine] and Isopropyl Alcohol, 74% w/w) Safety Precautions: Aspiration looking for blood return was conducted prior to all injections. At no point did we inject any substances, as a needle was being advanced. No attempts were made at seeking any paresthesias. Safe injection practices and needle disposal techniques used. Medications properly checked for expiration dates. SDV (single dose vial) medications used. Description of the Procedure: Protocol guidelines were followed. The procedure needle was introduced through the skin, ipsilateral to the reported pain, and advanced to the target area. Bone was  contacted and the needle walked caudad, until the lamina was cleared. The epidural space was identified using "loss-of-resistance technique" with 2-3 ml of PF-NaCl (0.9% NSS), in a 5cc LOR glass syringe.  Vitals:   03/15/19 1440 03/15/19 1445 03/15/19 1450 03/15/19 1458  BP: 135/71 137/81 134/83 132/84  Pulse:      Resp: 12 14 14 15   Temp:      SpO2: 98% 97% 97% 98%  Weight:      Height:        Start  Time: 1444 hrs. End Time: 1455 hrs.  Materials:  Needle(s) Type: Epidural needle Gauge: 17G Length: 5-in Medication(s): Please see orders for medications and dosing details. 8 cc solution made of 5 cc of preservative-free saline, 2 cc of 0.2% ropivacaine, 1 cc of Decadron 10 mg/cc.  6 out of 8 cc injected given pressure in legs after injecting 5 cc slowly.  1 additional cc injected and epidural needle removed.  6 cc injected total.  Imaging Guidance (Spinal):          Type of Imaging Technique: Fluoroscopy Guidance (Spinal) Indication(s): Assistance in needle guidance and placement for procedures requiring needle placement in or near specific anatomical locations not easily accessible without such assistance. Exposure Time: Please see nurses notes. Contrast: Before injecting any contrast, we confirmed that the patient did not have an allergy to iodine, shellfish, or radiological contrast. Once satisfactory needle placement was completed at the desired level, radiological contrast was injected. Contrast injected under live fluoroscopy. No contrast complications. See chart for type and volume of contrast used. Fluoroscopic Guidance: I was personally present during the use of fluoroscopy. "Tunnel Vision Technique" used to obtain the best possible view of the target area. Parallax error corrected before commencing the procedure. "Direction-depth-direction" technique used to introduce the needle under continuous pulsed fluoroscopy. Once target was reached, antero-posterior, oblique, and lateral  fluoroscopic projection used confirm needle placement in all planes. Images permanently stored in EMR. Interpretation: I personally interpreted the imaging intraoperatively. Adequate needle placement confirmed in multiple planes. Appropriate spread of contrast into desired area was observed. No evidence of afferent or efferent intravascular uptake. No intrathecal or subarachnoid spread observed. Permanent images saved into the patient's record.  Antibiotic Prophylaxis:   Anti-infectives (From admission, onward)   None     Indication(s): None identified  Post-operative Assessment:  Post-procedure Vital Signs:  Pulse/HCG Rate: 8364 Temp: 98.3 F (36.8 C) Resp: 15 BP: 132/84 SpO2: 98 %  EBL: None  Complications: No immediate post-treatment complications observed by team, or reported by patient.  Note: The patient tolerated the entire procedure well. A repeat set of vitals were taken after the procedure and the patient was kept under observation following institutional policy, for this type of procedure. Post-procedural neurological assessment was performed, showing return to baseline, prior to discharge. The patient was provided with post-procedure discharge instructions, including a section on how to identify potential problems. Should any problems arise concerning this procedure, the patient was given instructions to immediately contact us, at any time, without hesitation. In any case, we plan to contact the patient by telephone for a follow-up status report regarding this interventional procedure.  Comments:  No additional relevant information. 5 out of 5 strength bilateral lower extremity: Plantar flexion, dorsiflexion, knee flexion, knee extension.  Plan of Care  Orders:  Orders Placed This Encounter  Procedures  . DG PAIN CLINIC C-ARM 1-60 MIN NO REPORT    Intraoperative interpretation by procedural physician at [facility].    Standing Status:   Standing    Number  of Occurrences:   1    Order Specific Question:   Reason for exam:    Answer:   Assistance in needle guidance and placement for procedures requiring needle placement in or near specific anatomical locations not easily accessible without such assistance.   Medications ordered for procedure: Meds ordered this encounter  Medications  . iohexol (OMNIPAQUE) 180 MG/ML injection 10 mL    Must be Myelogram-compatible. If not available, you may substitute  with a water-soluble, non-ionic, hypoallergenic, myelogram-compatible radiological contrast medium.  Marland Kitchen lidocaine (XYLOCAINE) 2 % (with pres) injection 400 mg  . sodium chloride flush (NS) 0.9 % injection 1 mL  . ropivacaine (PF) 2 mg/mL (0.2%) (NAROPIN) injection 1 mL  . dexamethasone (DECADRON) injection 10 mg   Medications administered: We administered iohexol, lidocaine, sodium chloride flush, ropivacaine (PF) 2 mg/mL (0.2%), and dexamethasone.  See the medical record for exact dosing, route, and time of administration.  Follow-up plan:   Return in about 4 weeks (around 04/12/2019) for Post Procedure Evaluation, virtual.      Status post right L5-S1 ESI 03/23/2019, 6 out of 8 cc injected   Recent Visits Date Type Provider Dept  02/25/19 Office Visit [name], MD [facility]  Showing recent visits within past 90 days and meeting all other requirements   Today's Visits Date Type Provider Dept  03/15/19 Procedure visit [name], MD [facility]  Showing today's visits and meeting all other requirements   Future Appointments Date Type Provider Dept  04/08/19 Appointment [name], MD [facility]  Showing future appointments within next 90 days and meeting all other requirements   Disposition: Discharge home  Discharge Date & Time: 03/15/2019; 1505 hrs.   Primary Care Physician: Kirk Ruths, MD Location: [facility] Note by: [name], MD Date: 03/15/2019;  Time: 3:58 PM  Disclaimer:  Medicine is not an exact science. The only guarantee in medicine is that nothing is guaranteed. It is important to note that the decision to proceed with this intervention was based on the information collected from the patient. The Data and conclusions were drawn from the patient's questionnaire, the interview, and the physical examination. Because the information was provided in large part by the patient, it cannot be guaranteed that it has not been purposely or unconsciously manipulated. Every effort has been made to obtain as much relevant data as possible for this evaluation. It is important to note that the conclusions that lead to this procedure are derived in large part from the available data. Always take into account that the treatment will also be dependent on availability of resources and existing treatment guidelines, considered by other Pain Management Practitioners as being common knowledge and practice, at the time of the intervention. For Medico-Legal purposes, it is also important to point out that variation in procedural techniques and pharmacological choices are the acceptable norm. The indications, contraindications, technique, and results of the above procedure should only be interpreted and judged by a Board-Certified Interventional Pain Specialist with extensive familiarity and expertise in the same exact procedure and technique.

## 2019-03-15 NOTE — Progress Notes (Signed)
Safety precautions to be maintained throughout the outpatient stay will include: orient to surroundings, keep bed in low position, maintain call bell within reach at all times, provide assistance with transfer out of bed and ambulation.  

## 2019-03-16 ENCOUNTER — Telehealth: Payer: Self-pay

## 2019-03-16 NOTE — Telephone Encounter (Signed)
Patient was called and message was left on answering service.

## 2019-04-07 ENCOUNTER — Encounter: Payer: Self-pay | Admitting: Student in an Organized Health Care Education/Training Program

## 2019-04-08 ENCOUNTER — Other Ambulatory Visit: Payer: Self-pay

## 2019-04-08 ENCOUNTER — Ambulatory Visit
Payer: Medicare HMO | Attending: Student in an Organized Health Care Education/Training Program | Admitting: Student in an Organized Health Care Education/Training Program

## 2019-04-08 ENCOUNTER — Encounter: Payer: Self-pay | Admitting: Student in an Organized Health Care Education/Training Program

## 2019-04-08 DIAGNOSIS — Z9889 Other specified postprocedural states: Secondary | ICD-10-CM

## 2019-04-08 DIAGNOSIS — M5416 Radiculopathy, lumbar region: Secondary | ICD-10-CM

## 2019-04-08 DIAGNOSIS — M545 Low back pain, unspecified: Secondary | ICD-10-CM

## 2019-04-08 DIAGNOSIS — Z96652 Presence of left artificial knee joint: Secondary | ICD-10-CM | POA: Diagnosis not present

## 2019-04-08 DIAGNOSIS — M5137 Other intervertebral disc degeneration, lumbosacral region: Secondary | ICD-10-CM

## 2019-04-08 DIAGNOSIS — G894 Chronic pain syndrome: Secondary | ICD-10-CM

## 2019-04-08 DIAGNOSIS — M51379 Other intervertebral disc degeneration, lumbosacral region without mention of lumbar back pain or lower extremity pain: Secondary | ICD-10-CM

## 2019-04-08 NOTE — Progress Notes (Signed)
Pain Management Virtual Encounter Note - Virtual Visit via [place] (real-time audio visits between healthcare provider and patient).   Patient's Phone No. & Preferred Pharmacy:  [phone] (home); [phone] (mobile); (Preferred) [phone] [email or web address]  [id number] Christine Higgins, [place] [address] Phone: [phone] Fax: [phone]  [id number] [place] [address] [place] [address] Phone: [phone] Fax: [phone]    Pre-screening note:  Our staff contacted Christine Higgins and offered her an "in person", "face-to-face" appointment versus a telephone encounter. She indicated preferring the telephone encounter, at this time.   Reason for Virtual Visit: COVID-19*  Social distancing based on CDC and AMA recommendations.   I contacted Christine Higgins on 04/08/2019 via video conference.      I clearly identified myself as Christine Santa, MD. I verified that I was speaking with the correct person using two identifiers (Name: Christine Higgins, and date of birth: May 29, 1956).  Advanced Informed Consent I sought verbal advanced consent from Christine Higgins for virtual visit interactions. I informed Christine Higgins of possible security and privacy concerns, risks, and limitations associated with providing "not-in-person" medical evaluation and management services. I also informed Christine Higgins of the availability of "in-person" appointments. Finally, I informed her that there would be a charge for the virtual visit and that she could be  personally, fully or partially, financially responsible for it. Christine Higgins expressed understanding and agreed to proceed.   Historic Elements   Christine Higgins is a 63 y.o. year old, female patient evaluated today after her last encounter by our practice on 03/16/2019. Christine Higgins  has a past medical history of Breast mass, Cervical disc disease, Chronic  pain syndrome, Degenerative disc disease, lumbar, Fibromyalgia, Fibromyalgia, GERD (gastroesophageal reflux disease), Glaucoma, Graves disease, Hemorrhoids, Hyperlipidemia, Hypertension, and Thyroid disease. She also  has a past surgical history that includes carpel tunn (Right); Hand surgery (Right); carpel tunnel (Left); Cesarean section; Shoulder surgery (Right); Back surgery; Neck surgery; Total hip arthroplasty (Right); Excision Morton's neuroma (Left, 02/05/2017); Colonoscopy; Joint replacement; Esophagogastroduodenoscopy (egd) with propofol (N/A, 06/26/2017); Knee Arthroplasty (Left, 08/11/2017); and Knee arthroscopy (Right, 07/02/2018). [name] has a current medication list which includes the following prescription(s): acetaminophen, vitamin c, atorvastatin, calcium carbonate, celecoxib, vitamin d3, ciclopirox, ciclopirox, clobetasol cream, clonazepam, clotrimazole-betamethasone, coq-10, vitamin b-12, diphenhydramine, duloxetine, duloxetine, esomeprazole, gabapentin, hydralazine, hydroxyzine, linaclotide, losartan-hydrochlorothiazide, methimazole, multiple vitamins-minerals, naphazoline-pheniramine, prenatal mv-min-fe fum-fa-dha, probiotic product, propranolol, propylene glycol, tizanidine, trazodone, turmeric, ventolin hfa, vitamin a, vitamin e, hydrocodone-acetaminophen, hydrocortisone, menthol (topical analgesic), omeprazole, sodium chloride, solifenacin, tramadol, and triamcinolone cream. She  reports that she quit smoking about 26 years ago. She has never used smokeless tobacco. She reports previous drug use. Drugs: Marijuana and Cocaine. She reports that she does not drink alcohol. [name] is allergic to cephalexin; shellfish allergy; and aspirin.   HPI  Today, she is being contacted for a post-procedure assessment.   Evaluation of last interventional procedure  03/15/2019 Procedure: Right L5-S1 ESI Pre-procedure pain score:  7/10 Post-procedure pain score: 0/10         Influential  Factors: Intra-procedural challenges: None observed.         Reported side-effects: None.        Post-procedural adverse reactions or complications: None reported         Sedation: Please see nurses note for DOS. When no sedatives are used, the analgesic levels obtained are directly associated to  the effectiveness of the local anesthetics. However, when sedation is provided, the level of analgesia obtained during the initial 1 hour following the intervention, is believed to be the result of a combination of factors. These factors may include, but are not limited to: 1. The effectiveness of the local anesthetics used. 2. The effects of the analgesic(s) and/or anxiolytic(s) used. 3. The degree of discomfort experienced by the patient at the time of the procedure. 4. The patients ability and reliability in recalling and recording the events. 5. The presence and influence of possible secondary gains and/or psychosocial factors. Reported result: Relief experienced during the 1st hour after the procedure: 80 % (Ultra-Short Term Relief)            Interpretative annotation: Clinically appropriate result. Analgesia during this period is likely to be Local Anesthetic and/or IV Sedative (Analgesic/Anxiolytic) related.          Effects of local anesthetic: The analgesic effects attained during this period are directly associated to the localized infiltration of local anesthetics and therefore cary significant diagnostic value as to the etiological location, or anatomical origin, of the pain. Expected duration of relief is directly dependent on the pharmacodynamics of the local anesthetic used. Long-acting (4-6 hours) anesthetics used.  Reported result: Relief during the next 4 to 6 hour after the procedure: 80 % (Short-Term Relief)            Interpretative annotation: Clinically appropriate result. Analgesia during this period is likely to be Local Anesthetic-related.          Long-term benefit: Defined as  the period of time past the expected duration of local anesthetics (1 hour for short-acting and 4-6 hours for long-acting). With the possible exception of prolonged sympathetic blockade from the local anesthetics, benefits during this period are typically attributed to, or associated with, other factors such as analgesic sensory neuropraxia, antiinflammatory effects, or beneficial biochemical changes provided by agents other than the local anesthetics.  Reported result: Extended relief following procedure: 75 %(for about 3-4 days) with gradual return of pain thereafter (Long-Term Relief)            Interpretative annotation: Clinically appropriate result. Good relief. No permanent benefit expected. Inflammation plays a part in the etiology to the pain.           Laboratory Chemistry Profile (12 mo)  Renal: 06/26/2018: BUN 10; Creatinine, Ser 0.65  Lab Results  Component Value Date   GFRAA >60 06/26/2018   GFRNONAA >60 06/26/2018   Hepatic: No results found for requested labs within last 8760 hours. Lab Results  Component Value Date   AST 27 08/18/2017   ALT 23 08/18/2017   Other: No results found for requested labs within last 8760 hours. Note: Above Lab results reviewed.  Imaging  Last 90 days:  Dg [facility] C-arm 1-60 Min No Report  Result Date: 03/15/2019 Fluoro was used, but no Radiologist interpretation will be provided. Please refer to "NOTES" tab for provider progress note.   Assessment  The primary encounter diagnosis was Lumbar radiculopathy. Diagnoses of History of lumbar laminectomy for spinal cord decompression (L3-L4, 2015), Status post total left knee replacement, Back pain at L4-L5 level, DDD (degenerative disc disease), lumbosacral, and Chronic pain syndrome were also pertinent to this visit.  Plan of Care  I am having Karol A. Utke maintain her clotrimazole-betamethasone, traZODone, methimazole, DULoxetine, atorvastatin, vitamin C, diphenhydrAMINE, celecoxib,  DULoxetine, linaclotide, losartan-hydrochlorothiazide, tiZANidine, VITAMIN E PO, Probiotic Product (PROBIOTIC PO), Multiple Vitamins-Minerals (ALIVE WOMENS  ENERGY PO), omeprazole, calcium carbonate, Vitamin D3, Vitamin B-12, (Menthol, Topical Analgesic, (ICY HOT EX)), Propylene Glycol, sodium chloride, clonazePAM, traMADol, solifenacin, ciclopirox, propranolol, triamcinolone cream, ciclopirox, hydrALAZINE, hydrOXYzine, Ventolin HFA, Turmeric, VITAMIN A PO, CoQ-10, Acetaminophen (TYLENOL ARTHRITIS EXT RELIEF PO), esomeprazole, hydrocortisone, clobetasol cream, Naphazoline-Pheniramine (OPCON-A OP), Prenatal MV-Min-Fe Fum-FA-DHA (PRENATAL 1 PO), HYDROcodone-acetaminophen, and gabapentin.  1. Lumbar radiculopathy Status post right L5-S1 ESI 1.728 2020.  Was only able to inject 6 out of 8 cc given increased pressure in the patient's right leg during injection.  Plan for 2nd lumbar ESI to reduce volume but to make it more concentrated.  Plan for 5 cc.  - Lumbar Epidural Injection; Future   Orders:  Orders Placed This Encounter  Procedures  . Lumbar Epidural Injection    Standing Status:   Future    Standing Expiration Date:   05/09/2019    Scheduling Instructions:     Procedure: Interlaminar Lumbar Epidural Steroid injection (LESI)            Laterality: RIGHT L5/S1 ESI (#2)     Sedation: without     Timeframe: ASAA    Order Specific Question:   Where will this procedure be performed?    Answer:   [facility]   Follow-up plan:   Return in about 2 weeks (around 04/22/2019) for Procedure R L5-S1 ESI#2 w.o (5cc, concentrated).     Status post right L5-S1 ESI 03/23/2019, 6 out of 8 cc injected; helpful, repeat with lower volume, make more concentrated.    Recent Visits Date Type Provider Dept  03/15/19 Procedure visit Christine Santa, MD [facility]  02/25/19 Office Visit Christine Santa, MD [facility]  Showing recent visits within past 90 days and meeting all other  requirements   Today's Visits Date Type Provider Dept  04/08/19 Office Visit Christine Santa, MD [facility]  Showing today's visits and meeting all other requirements   Future Appointments No visits were found meeting these conditions.  Showing future appointments within next 90 days and meeting all other requirements   I discussed the assessment and treatment plan with the patient. The patient was provided an opportunity to ask questions and all were answered. The patient agreed with the plan and demonstrated an understanding of the instructions.  Patient advised to call back or seek an in-person evaluation if the symptoms or condition worsens.  Total duration of non-face-to-face encounter: 15 minutes.  Note by: Christine Santa, MD Date: 04/08/2019; Time: 1:27 PM  Note: This dictation was prepared with Dragon dictation. Any transcriptional errors that may result from this process are unintentional.  Disclaimer:  * Given the special circumstances of the COVID-19 pandemic, the federal government has announced that the Office for Civil Rights (OCR) will exercise its enforcement discretion and will not impose penalties on physicians using telehealth in the event of noncompliance with regulatory requirements under the [place] (HIPAA) in connection with the good faith provision of telehealth during the SHUOH-72 national public health emergency. ([place])

## 2019-04-16 DIAGNOSIS — H25012 Cortical age-related cataract, left eye: Secondary | ICD-10-CM | POA: Diagnosis not present

## 2019-04-20 DIAGNOSIS — E042 Nontoxic multinodular goiter: Secondary | ICD-10-CM | POA: Diagnosis not present

## 2019-04-21 ENCOUNTER — Ambulatory Visit (HOSPITAL_BASED_OUTPATIENT_CLINIC_OR_DEPARTMENT_OTHER): Payer: Medicare HMO | Admitting: Student in an Organized Health Care Education/Training Program

## 2019-04-21 ENCOUNTER — Ambulatory Visit
Admission: RE | Admit: 2019-04-21 | Discharge: 2019-04-21 | Disposition: A | Discharge status: Home / Self Care | Payer: Medicare HMO | Source: Ambulatory Visit | Attending: Student in an Organized Health Care Education/Training Program | Admitting: Student in an Organized Health Care Education/Training Program

## 2019-04-21 ENCOUNTER — Other Ambulatory Visit: Payer: Self-pay

## 2019-04-21 ENCOUNTER — Encounter: Payer: Self-pay | Admitting: Student in an Organized Health Care Education/Training Program

## 2019-04-21 VITALS — BP 130/68 | HR 79 | Temp 98.3°F | Resp 14 | Ht 71.0 in | Wt 260.0 lb

## 2019-04-21 DIAGNOSIS — M5416 Radiculopathy, lumbar region: Secondary | ICD-10-CM | POA: Diagnosis not present

## 2019-04-21 MED ORDER — LIDOCAINE HCL 2 % IJ SOLN
20.0000 mL | Freq: Once | INTRAMUSCULAR | Status: AC
  Administered 2019-04-21: 400 mg

## 2019-04-21 MED ORDER — DEXAMETHASONE SODIUM PHOSPHATE 10 MG/ML IJ SOLN
INTRAMUSCULAR | Status: AC
  Filled 2019-04-21: qty 1

## 2019-04-21 MED ORDER — ROPIVACAINE HCL 2 MG/ML IJ SOLN
1.0000 mL | Freq: Once | INTRAMUSCULAR | Status: AC
  Administered 2019-04-21: 1 mL via EPIDURAL

## 2019-04-21 MED ORDER — IOHEXOL 180 MG/ML  SOLN
10.0000 mL | Freq: Once | INTRAMUSCULAR | Status: AC
  Administered 2019-04-21: 10 mL via EPIDURAL

## 2019-04-21 MED ORDER — DEXAMETHASONE SODIUM PHOSPHATE 10 MG/ML IJ SOLN
10.0000 mg | Freq: Once | INTRAMUSCULAR | Status: AC
  Administered 2019-04-21: 10 mg

## 2019-04-21 MED ORDER — SODIUM CHLORIDE 0.9% FLUSH
1.0000 mL | Freq: Once | INTRAVENOUS | Status: AC
  Administered 2019-04-21: 1 mL

## 2019-04-21 MED ORDER — LIDOCAINE HCL 2 % IJ SOLN
INTRAMUSCULAR | Status: AC
  Filled 2019-04-21: qty 20

## 2019-04-21 MED ORDER — ROPIVACAINE HCL 2 MG/ML IJ SOLN
INTRAMUSCULAR | Status: AC
  Filled 2019-04-21: qty 10

## 2019-04-21 MED ORDER — SODIUM CHLORIDE (PF) 0.9 % IJ SOLN
INTRAMUSCULAR | Status: AC
  Filled 2019-04-21: qty 10

## 2019-04-21 NOTE — Progress Notes (Signed)
Patient's Name: Christine Higgins  MRN: [id number]  Referring Provider: [name], MD  DOB: Feb 09, 1956  PCP: Kirk Ruths, MD  DOS: 04/21/2019  Note by: [name], MD  Service setting: Ambulatory outpatient  Specialty: Interventional Pain Management  Patient type: Established  Location: [facility] (AMB) Pain Management Facility  Visit type: Interventional Procedure   Primary Reason for Visit: Interventional Pain Management Treatment. CC: Back Pain (low)  Procedure:          Anesthesia, Analgesia, Anxiolysis:  Type: Therapeutic Inter-Laminar Epidural Steroid Injection  #2  Region: Lumbar Level: L5-S1 Level. Laterality: Left-Sided         Type: Local Anesthesia  Local Anesthetic: Lidocaine 1-2%  Position: Prone with head of the table was raised to facilitate breathing.   Indications: 1. Lumbar radiculopathy    Pain Score: Pre-procedure: 8 /10 Post-procedure: 1 /10   Pre-op Assessment:  Christine Higgins is a 63 y.o. (year old), female patient, seen today for interventional treatment. She  has a past surgical history that includes carpel tunn (Right); Hand surgery (Right); carpel tunnel (Left); Cesarean section; Shoulder surgery (Right); Back surgery; Neck surgery; Total hip arthroplasty (Right); Excision Morton's neuroma (Left, 02/05/2017); Colonoscopy; Joint replacement; Esophagogastroduodenoscopy (egd) with propofol (N/A, 06/26/2017); Knee Arthroplasty (Left, 08/11/2017); and Knee arthroscopy (Right, 07/02/2018). Christine Higgins has a current medication list which includes the following prescription(s): acetaminophen, vitamin c, atorvastatin, calcium carbonate, celecoxib, vitamin d3, ciclopirox, clobetasol cream, clonazepam, clotrimazole-betamethasone, coq-10, vitamin b-12, cyclobenzaprine, diphenhydramine, duloxetine, duloxetine, esomeprazole, gabapentin, hydralazine, hydrocortisone, hydroxyzine, linaclotide, losartan-hydrochlorothiazide, menthol (topical analgesic), methimazole, multiple  vitamins-minerals, naphazoline-pheniramine, omeprazole, prenatal mv-min-fe fum-fa-dha, probiotic product, propranolol, propylene glycol, tizanidine, trazodone, turmeric, ventolin hfa, vitamin a, vitamin e, ciclopirox, hydrocodone-acetaminophen, sodium chloride, solifenacin, tramadol, and triamcinolone cream. Her primarily concern today is the Back Pain (low)  Initial Vital Signs:  Pulse/HCG Rate: 79ECG Heart Rate: 79 Temp: 98.3 F (36.8 C) Resp: 16 BP: 128/74 SpO2: 98 %  BMI: Estimated body mass index is 36.26 kg/m as calculated from the following:   Height as of this encounter: 5\' 11"  (1.803 m).   Weight as of this encounter: 260 lb (117.9 kg).  Risk Assessment: Allergies: Reviewed. She is allergic to cephalexin; shellfish allergy; and aspirin.  Allergy Precautions: None required Coagulopathies: Reviewed. None identified.  Blood-thinner therapy: None at this time Active Infection(s): Reviewed. None identified. Christine Higgins is afebrile  Site Confirmation: Christine Higgins was asked to confirm the procedure and laterality before marking the site Procedure checklist: Completed Consent: Before the procedure and under the influence of no sedative(s), amnesic(s), or anxiolytics, the patient was informed of the treatment options, risks and possible complications. To fulfill our ethical and legal obligations, as recommended by the American Medical Association's Code of Ethics, I have informed the patient of my clinical impression; the nature and purpose of the treatment or procedure; the risks, benefits, and possible complications of the intervention; the alternatives, including doing nothing; the risk(s) and benefit(s) of the alternative treatment(s) or procedure(s); and the risk(s) and benefit(s) of doing nothing. The patient was provided information about the general risks and possible complications associated with the procedure. These may include, but are not limited to: failure to achieve desired goals,  infection, bleeding, organ or nerve damage, allergic reactions, paralysis, and death. In addition, the patient was informed of those risks and complications associated to Spine-related procedures, such as failure to decrease pain; infection (i.e.: Meningitis, epidural or intraspinal abscess); bleeding (i.e.: epidural hematoma, subarachnoid hemorrhage, or any other type of intraspinal or  peri-dural bleeding); organ or nerve damage (i.e.: Any type of peripheral nerve, nerve root, or spinal cord injury) with subsequent damage to sensory, motor, and/or autonomic systems, resulting in permanent pain, numbness, and/or weakness of one or several areas of the body; allergic reactions; (i.e.: anaphylactic reaction); and/or death. Furthermore, the patient was informed of those risks and complications associated with the medications. These include, but are not limited to: allergic reactions (i.e.: anaphylactic or anaphylactoid reaction(s)); adrenal axis suppression; blood sugar elevation that in diabetics may result in ketoacidosis or comma; water retention that in patients with history of congestive heart failure may result in shortness of breath, pulmonary edema, and decompensation with resultant heart failure; weight gain; swelling or edema; medication-induced neural toxicity; particulate matter embolism and blood vessel occlusion with resultant organ, and/or nervous system infarction; and/or aseptic necrosis of one or more joints. Finally, the patient was informed that Medicine is not an exact science; therefore, there is also the possibility of unforeseen or unpredictable risks and/or possible complications that may result in a catastrophic outcome. The patient indicated having understood very clearly. We have given the patient no guarantees and we have made no promises. Enough time was given to the patient to ask questions, all of which were answered to the patient's satisfaction. Christine Higgins has indicated that she  wanted to continue with the procedure. Attestation: I, the ordering provider, attest that I have discussed with the patient the benefits, risks, side-effects, alternatives, likelihood of achieving goals, and potential problems during recovery for the procedure that I have provided informed consent. Date  Time:   Pre-Procedure Preparation:  Monitoring: As per clinic protocol. Respiration, ETCO2, SpO2, BP, heart rate and rhythm monitor placed and checked for adequate function Safety Precautions: Patient was assessed for positional comfort and pressure points before starting the procedure. Time-out: I initiated and conducted the "Time-out" before starting the procedure, as per protocol. The patient was asked to participate by confirming the accuracy of the "Time Out" information. Verification of the correct person, site, and procedure were performed and confirmed by me, the nursing staff, and the patient. "Time-out" conducted as per Joint Commission's Universal Protocol (UP.01.01.01). Time: 0923  Description of Procedure:          Target Area: The interlaminar space, initially targeting the lower laminar border of the superior vertebral body. Approach: Paramedial approach. Area Prepped: Entire Posterior Lumbar Region Prepping solution: DuraPrep (Iodine Povacrylex [0.7% available iodine] and Isopropyl Alcohol, 74% w/w) Safety Precautions: Aspiration looking for blood return was conducted prior to all injections. At no point did we inject any substances, as a needle was being advanced. No attempts were made at seeking any paresthesias. Safe injection practices and needle disposal techniques used. Medications properly checked for expiration dates. SDV (single dose vial) medications used. Description of the Procedure: Protocol guidelines were followed. The procedure needle was introduced through the skin, ipsilateral to the reported pain, and advanced to the target area. Bone was contacted and the needle  walked caudad, until the lamina was cleared. The epidural space was identified using "loss-of-resistance technique" with 2-3 ml of PF-NaCl (0.9% NSS), in a 5cc LOR glass syringe.  Vitals:   04/21/19 0923 04/21/19 0929 04/21/19 0930 04/21/19 0937  BP: (!) 136/93 (!) 140/92 (!) 154/98 130/68  Pulse:      Resp: 17 16 14    Temp:      SpO2: 100% 100% 100%   Weight:      Height:        Start  Time: 0923 hrs. End Time: 0930 hrs.  Materials:  Needle(s) Type: Epidural needle Gauge: 17G Length: 5-in Medication(s): Please see orders for medications and dosing details. 5 cc solution made of 2 cc of preservative-free saline, 2 cc of 0.2% ropivacaine, 1 cc of Decadron 10 mg/cc.   Imaging Guidance (Spinal):          Type of Imaging Technique: Fluoroscopy Guidance (Spinal) Indication(s): Assistance in needle guidance and placement for procedures requiring needle placement in or near specific anatomical locations not easily accessible without such assistance. Exposure Time: Please see nurses notes. Contrast: Before injecting any contrast, we confirmed that the patient did not have an allergy to iodine, shellfish, or radiological contrast. Once satisfactory needle placement was completed at the desired level, radiological contrast was injected. Contrast injected under live fluoroscopy. No contrast complications. See chart for type and volume of contrast used. Fluoroscopic Guidance: I was personally present during the use of fluoroscopy. "Tunnel Vision Technique" used to obtain the best possible view of the target area. Parallax error corrected before commencing the procedure. "Direction-depth-direction" technique used to introduce the needle under continuous pulsed fluoroscopy. Once target was reached, antero-posterior, oblique, and lateral fluoroscopic projection used confirm needle placement in all planes. Images permanently stored in EMR. Interpretation: I personally interpreted the imaging  intraoperatively. Adequate needle placement confirmed in multiple planes. Appropriate spread of contrast into desired area was observed. No evidence of afferent or efferent intravascular uptake. No intrathecal or subarachnoid spread observed. Permanent images saved into the patient's record.  Antibiotic Prophylaxis:   Anti-infectives (From admission, onward)   None     Indication(s): None identified  Post-operative Assessment:  Post-procedure Vital Signs:  Pulse/HCG Rate: 7979 Temp: 98.3 F (36.8 C) Resp: 14 BP: 130/68 SpO2: 100 %  EBL: None  Complications: No immediate post-treatment complications observed by team, or reported by patient.  Note: The patient tolerated the entire procedure well. A repeat set of vitals were taken after the procedure and the patient was kept under observation following institutional policy, for this type of procedure. Post-procedural neurological assessment was performed, showing return to baseline, prior to discharge. The patient was provided with post-procedure discharge instructions, including a section on how to identify potential problems. Should any problems arise concerning this procedure, the patient was given instructions to immediately contact us, at any time, without hesitation. In any case, we plan to contact the patient by telephone for a follow-up status report regarding this interventional procedure.  Comments:  No additional relevant information. 5 out of 5 strength bilateral lower extremity: Plantar flexion, dorsiflexion, knee flexion, knee extension.  Plan of Care  Orders:  Orders Placed This Encounter  Procedures  . DG PAIN [facility] C-ARM 1-60 MIN NO REPORT    Intraoperative interpretation by procedural physician at [facility].    Standing Status:   Standing    Number of Occurrences:   1    Order Specific Question:   Reason for exam:    Answer:   Assistance in needle guidance and placement for procedures requiring needle  placement in or near specific anatomical locations not easily accessible without such assistance.   Medications ordered for procedure: Meds ordered this encounter  Medications  . iohexol (OMNIPAQUE) 180 MG/ML injection 10 mL    Must be Myelogram-compatible. If not available, you may substitute with a water-soluble, non-ionic, hypoallergenic, myelogram-compatible radiological contrast medium.  Marland Kitchen lidocaine (XYLOCAINE) 2 % (with pres) injection 400 mg  . ropivacaine (PF) 2 mg/mL (0.2%) (NAROPIN) injection  1 mL  . dexamethasone (DECADRON) injection 10 mg  . sodium chloride flush (NS) 0.9 % injection 1 mL   Medications administered: We administered iohexol, lidocaine, ropivacaine (PF) 2 mg/mL (0.2%), dexamethasone, and sodium chloride flush.  See the medical record for exact dosing, route, and time of administration.  Follow-up plan:   Return in about 4 weeks (around 05/19/2019) for Procedure, virtual.      Status post right L5-S1 ESI 03/23/2019, 6 out of 8 cc injected, #2 on 04/21/19, 5 cc more concentrated   Recent Visits Date Type Provider Dept  04/08/19 Office Visit [name], MD [facility]  03/15/19 Procedure visit [name], MD [facility]  02/25/19 Office Visit [name], MD [facility]  Showing recent visits within past 90 days and meeting all other requirements   Today's Visits Date Type Provider Dept  04/21/19 Procedure visit [name], MD [facility]  Showing today's visits and meeting all other requirements   Future Appointments Date Type Provider Dept  05/26/19 Appointment [name], MD [facility]  Showing future appointments within next 90 days and meeting all other requirements   Disposition: Discharge home  Discharge Date & Time: 04/21/2019; 0938 hrs.   Primary Care Physician: Kirk Ruths, MD Location: [facility] Note by: [name], MD Date: 04/21/2019;  Time: 9:48 AM  Disclaimer:  Medicine is not an exact science. The only guarantee in medicine is that nothing is guaranteed. It is important to note that the decision to proceed with this intervention was based on the information collected from the patient. The Data and conclusions were drawn from the patient's questionnaire, the interview, and the physical examination. Because the information was provided in large part by the patient, it cannot be guaranteed that it has not been purposely or unconsciously manipulated. Every effort has been made to obtain as much relevant data as possible for this evaluation. It is important to note that the conclusions that lead to this procedure are derived in large part from the available data. Always take into account that the treatment will also be dependent on availability of resources and existing treatment guidelines, considered by other Pain Management Practitioners as being common knowledge and practice, at the time of the intervention. For Medico-Legal purposes, it is also important to point out that variation in procedural techniques and pharmacological choices are the acceptable norm. The indications, contraindications, technique, and results of the above procedure should only be interpreted and judged by a Board-Certified Interventional Pain Specialist with extensive familiarity and expertise in the same exact procedure and technique.

## 2019-04-21 NOTE — Patient Instructions (Signed)

## 2019-04-22 ENCOUNTER — Telehealth: Payer: Self-pay

## 2019-04-22 NOTE — Telephone Encounter (Signed)
Post procedure phone call.  States she is doing good.  

## 2019-05-05 DIAGNOSIS — R05 Cough: Secondary | ICD-10-CM | POA: Diagnosis not present

## 2019-05-05 DIAGNOSIS — J310 Chronic rhinitis: Secondary | ICD-10-CM | POA: Diagnosis not present

## 2019-05-05 DIAGNOSIS — M1731 Unilateral post-traumatic osteoarthritis, right knee: Secondary | ICD-10-CM | POA: Diagnosis not present

## 2019-05-05 DIAGNOSIS — M2550 Pain in unspecified joint: Secondary | ICD-10-CM | POA: Diagnosis not present

## 2019-05-05 DIAGNOSIS — J449 Chronic obstructive pulmonary disease, unspecified: Secondary | ICD-10-CM | POA: Diagnosis not present

## 2019-05-18 ENCOUNTER — Encounter: Admission: RE | Payer: Self-pay | Source: Home / Self Care

## 2019-05-18 ENCOUNTER — Ambulatory Visit: Admission: RE | Admit: 2019-05-18 | Payer: Medicare HMO | Source: Home / Self Care | Admitting: Ophthalmology

## 2019-05-18 SURGERY — PHACOEMULSIFICATION, CATARACT, WITH IOL INSERTION
Anesthesia: Topical | Laterality: Left

## 2019-05-25 ENCOUNTER — Encounter: Payer: Self-pay | Admitting: Student in an Organized Health Care Education/Training Program

## 2019-05-26 ENCOUNTER — Encounter: Payer: Self-pay | Admitting: Student in an Organized Health Care Education/Training Program

## 2019-05-26 ENCOUNTER — Ambulatory Visit
Payer: Medicare HMO | Attending: Student in an Organized Health Care Education/Training Program | Admitting: Student in an Organized Health Care Education/Training Program

## 2019-05-26 ENCOUNTER — Other Ambulatory Visit: Payer: Self-pay

## 2019-05-26 DIAGNOSIS — M545 Low back pain, unspecified: Secondary | ICD-10-CM

## 2019-05-26 DIAGNOSIS — Z9889 Other specified postprocedural states: Secondary | ICD-10-CM | POA: Diagnosis not present

## 2019-05-26 DIAGNOSIS — M5137 Other intervertebral disc degeneration, lumbosacral region: Secondary | ICD-10-CM | POA: Diagnosis not present

## 2019-05-26 DIAGNOSIS — Z96652 Presence of left artificial knee joint: Secondary | ICD-10-CM | POA: Diagnosis not present

## 2019-05-26 DIAGNOSIS — G894 Chronic pain syndrome: Secondary | ICD-10-CM | POA: Diagnosis not present

## 2019-05-26 DIAGNOSIS — Z981 Arthrodesis status: Secondary | ICD-10-CM

## 2019-05-26 DIAGNOSIS — M5416 Radiculopathy, lumbar region: Secondary | ICD-10-CM | POA: Diagnosis not present

## 2019-05-26 DIAGNOSIS — M797 Fibromyalgia: Secondary | ICD-10-CM | POA: Diagnosis not present

## 2019-05-26 NOTE — Progress Notes (Signed)
Pain Management Virtual Encounter Note - Virtual Visit via [place] (real-time audio visits between healthcare provider and patient).   Patient's Phone No. & Preferred Pharmacy:  [phone] (home); [phone] (mobile); (Preferred) [phone] [email or web address]  [id number] [name], [place] [address] [place] [address] Phone: [phone] Fax: [phone]    Pre-screening note:  Our staff contacted Christine Higgins and offered her an "in person", "face-to-face" appointment versus a telephone encounter. She indicated preferring the telephone encounter, at this time.   Reason for Virtual Visit: COVID-19*  Social distancing based on CDC and AMA recommendations.   I contacted Christine Higgins on 05/26/2019 via video conference.      I clearly identified myself as [name], MD. I verified that I was speaking with the correct person using two identifiers (Name: Christine Higgins, and date of birth: 10/20/1955).  Advanced Informed Consent I sought verbal advanced consent from Christine Higgins for virtual visit interactions. I informed Christine Higgins of possible security and privacy concerns, risks, and limitations associated with providing "not-in-person" medical evaluation and management services. I also informed Christine Higgins of the availability of "in-person" appointments. Finally, I informed her that there would be a charge for the virtual visit and that she could be  personally, fully or partially, financially responsible for it. Christine Higgins expressed understanding and agreed to proceed.   Historic Elements   Christine Higgins is a 63 y.o. year old, female patient evaluated today after her last encounter by our practice on 04/22/2019. Christine Higgins  has a past medical history of Breast mass, Cervical disc disease, Chronic pain syndrome, Degenerative disc disease, lumbar, Fibromyalgia, Fibromyalgia, GERD (gastroesophageal reflux disease), Glaucoma, Graves  disease, Hemorrhoids, Hyperlipidemia, Hypertension, and Thyroid disease. She also  has a past surgical history that includes carpel tunn (Right); Hand surgery (Right); carpel tunnel (Left); Cesarean section; Shoulder surgery (Right); Back surgery; Neck surgery; Total hip arthroplasty (Right); Excision Morton's neuroma (Left, 02/05/2017); Colonoscopy; Joint replacement; Esophagogastroduodenoscopy (egd) with propofol (N/A, 06/26/2017); Knee Arthroplasty (Left, 08/11/2017); and Knee arthroscopy (Right, 07/02/2018). Christine Higgins has a current medication list which includes the following prescription(s): acetaminophen, vitamin c, atorvastatin, budesonide-formoterol, calcium carbonate, celecoxib, vitamin d3, ciclopirox, ciclopirox, clobetasol cream, clonazepam, clotrimazole-betamethasone, coq-10, vitamin b-12, cyclobenzaprine, cyclobenzaprine, diphenhydramine, duloxetine, duloxetine, esomeprazole, gabapentin, hydralazine, hydrocodone-acetaminophen, hydrocortisone, hydroxyzine, linaclotide, losartan-hydrochlorothiazide, menthol (topical analgesic), methimazole, multiple vitamins-minerals, naphazoline-pheniramine, omeprazole, prenatal mv-min-fe fum-fa-dha, probiotic product, propranolol, propylene glycol, sodium chloride, solifenacin, tizanidine, trazodone, triamcinolone cream, turmeric, ventolin hfa, vitamin a, vitamin e, and tramadol. She  reports that she quit smoking about 26 years ago. She has never used smokeless tobacco. She reports previous drug use. Drugs: Marijuana and Cocaine. She reports that she does not drink alcohol. Christine Higgins is allergic to cephalexin; shellfish allergy; and aspirin.   HPI  Today, she is being contacted for a post-procedure assessment.  Evaluation of last interventional procedure  04/21/2019 Procedure:   Type: Therapeutic Inter-Laminar Epidural Steroid Injection  #2  Region: Lumbar Level: L5-S1 Level. Laterality: Left-Sided         Sedation: Please see nurses note for DOS. When no  sedatives are used, the analgesic levels obtained are directly associated to the effectiveness of the local anesthetics. However, when sedation is provided, the level of analgesia obtained during the initial 1 hour following the intervention, is believed to be the result of a combination of factors. These factors may include, but are not limited to: 1. The effectiveness of the local anesthetics used. 2.  The effects of the analgesic(s) and/or anxiolytic(s) used. 3. The degree of discomfort experienced by the patient at the time of the procedure. 4. The patients ability and reliability in recalling and recording the events. 5. The presence and influence of possible secondary gains and/or psychosocial factors. Reported result: Relief experienced during the 1st hour after the procedure: 100 % (Ultra-Short Term Relief)            Interpretative annotation: Clinically appropriate result. Analgesia during this period is likely to be Local Anesthetic and/or IV Sedative (Analgesic/Anxiolytic) related.          Effects of local anesthetic: The analgesic effects attained during this period are directly associated to the localized infiltration of local anesthetics and therefore cary significant diagnostic value as to the etiological location, or anatomical origin, of the pain. Expected duration of relief is directly dependent on the pharmacodynamics of the local anesthetic used. Long-acting (4-6 hours) anesthetics used.  Reported result: Relief during the next 4 to 6 hour after the procedure: 100 % (Short-Term Relief)            Interpretative annotation: Clinically appropriate result. Analgesia during this period is likely to be Local Anesthetic-related.          Long-term benefit: Defined as the period of time past the expected duration of local anesthetics (1 hour for short-acting and 4-6 hours for long-acting). With the possible exception of prolonged sympathetic blockade from the local anesthetics, benefits  during this period are typically attributed to, or associated with, other factors such as analgesic sensory neuropraxia, antiinflammatory effects, or beneficial biochemical changes provided by agents other than the local anesthetics.  Reported result: Extended relief following procedure: 80 %(5 days) with gradual return of pain thereafter.  Notes improvement in her ability to walk and stand upright for longer period of time compared to pre-epidural.  (Long-Term Relief)            Interpretative annotation: Clinically appropriate result. Good relief. No permanent benefit expected. Inflammation plays a part in the etiology to the pain.           Laboratory Chemistry Profile (12 mo)  Renal: 06/26/2018: BUN 10; Creatinine, Ser 0.65  Lab Results  Component Value Date   GFRAA >60 06/26/2018   GFRNONAA >60 06/26/2018   Hepatic: No results found for requested labs within last 8760 hours. Lab Results  Component Value Date   AST 27 08/18/2017   ALT 23 08/18/2017   Other: No results found for requested labs within last 8760 hours. Note: Above Lab results reviewed.   Assessment  The primary encounter diagnosis was Lumbar radiculopathy. Diagnoses of History of lumbar laminectomy for spinal cord decompression (L3-L4, 2015), Status post total left knee replacement, Back pain at L4-L5 level, DDD (degenerative disc disease), lumbosacral, Chronic pain syndrome, S/P cervical spinal fusion, and Fibromyalgia were also pertinent to this visit.  Plan of Care  I am having Christine Higgins maintain her clotrimazole-betamethasone, traZODone, methimazole, DULoxetine, atorvastatin, vitamin C, diphenhydrAMINE, celecoxib, DULoxetine, linaclotide, losartan-hydrochlorothiazide, tiZANidine, VITAMIN E PO, Probiotic Product (PROBIOTIC PO), Multiple Vitamins-Minerals (ALIVE WOMENS ENERGY PO), omeprazole, calcium carbonate, Vitamin D3, Vitamin B-12, (Menthol, Topical Analgesic, (ICY HOT EX)), Propylene Glycol, sodium chloride,  clonazePAM, traMADol, solifenacin, ciclopirox, propranolol, triamcinolone cream, ciclopirox, hydrALAZINE, hydrOXYzine, Ventolin HFA, Turmeric, VITAMIN A PO, CoQ-10, Acetaminophen (TYLENOL ARTHRITIS EXT RELIEF PO), esomeprazole, hydrocortisone, clobetasol cream, Naphazoline-Pheniramine (OPCON-A OP), Prenatal MV-Min-Fe Fum-FA-DHA (PRENATAL 1 PO), HYDROcodone-acetaminophen, gabapentin, cyclobenzaprine, cyclobenzaprine, and budesonide-formoterol.  Plan for lumbar epidural steroid injection #  3 without sedation.  Orders:  Orders Placed This Encounter  Procedures  . Lumbar Epidural Injection    Standing Status:   Future    Standing Expiration Date:   06/25/2019    Scheduling Instructions:     Procedure: Interlaminar Lumbar Epidural Steroid injection (LESI)            Laterality: Midline     Sedation: without     Timeframe: ASAA    Order Specific Question:   Where will this procedure be performed?    Answer:   [facility]   Follow-up plan:   Return in about 2 weeks (around 06/09/2019) for Procedure: L5-S1 ESI without sedation.     Status post right L5-S1 ESI 03/23/2019, 6 out of 8 cc injected, #2 on 04/21/19, 5 cc more concentrated: Helped    Recent Visits Date Type Provider Dept  04/21/19 Procedure visit [name], MD [facility]  04/08/19 Office Visit [name], MD [facility]  03/15/19 Procedure visit [name], MD [facility]  02/25/19 Office Visit [name], MD [facility]  Showing recent visits within past 90 days and meeting all other requirements   Today's Visits Date Type Provider Dept  05/26/19 Office Visit [name], MD [facility]  Showing today's visits and meeting all other requirements   Future Appointments No visits were found meeting these conditions.  Showing future appointments within next 90 days and meeting all other requirements   I discussed the assessment and treatment plan with the  patient. The patient was provided an opportunity to ask questions and all were answered. The patient agreed with the plan and demonstrated an understanding of the instructions.  Patient advised to call back or seek an in-person evaluation if the symptoms or condition worsens.  Total duration of non-face-to-face encounter: 15 minutes.  Note by: [name], MD Date: 05/26/2019; Time: 2:37 PM  Note: This dictation was prepared with Dragon dictation. Any transcriptional errors that may result from this process are unintentional.  Disclaimer:  * Given the special circumstances of the COVID-19 pandemic, the federal government has announced that the Office for Civil Rights (OCR) will exercise its enforcement discretion and will not impose penalties on physicians using telehealth in the event of noncompliance with regulatory requirements under the [place] and [place] (HIPAA) in connection with the good faith provision of telehealth during the XX123456 national public health emergency. ([place])

## 2019-05-31 ENCOUNTER — Other Ambulatory Visit: Payer: Self-pay | Admitting: Physician Assistant

## 2019-05-31 DIAGNOSIS — R490 Dysphonia: Secondary | ICD-10-CM | POA: Diagnosis not present

## 2019-05-31 DIAGNOSIS — R1310 Dysphagia, unspecified: Secondary | ICD-10-CM

## 2019-05-31 DIAGNOSIS — E041 Nontoxic single thyroid nodule: Secondary | ICD-10-CM | POA: Diagnosis not present

## 2019-05-31 DIAGNOSIS — K219 Gastro-esophageal reflux disease without esophagitis: Secondary | ICD-10-CM | POA: Diagnosis not present

## 2019-06-10 ENCOUNTER — Ambulatory Visit: Payer: Medicare HMO

## 2019-06-14 ENCOUNTER — Encounter: Payer: Self-pay | Admitting: Student in an Organized Health Care Education/Training Program

## 2019-06-14 ENCOUNTER — Ambulatory Visit
Admission: RE | Admit: 2019-06-14 | Discharge: 2019-06-14 | Disposition: A | Discharge status: Home / Self Care | Payer: Medicare HMO | Source: Ambulatory Visit | Attending: Student in an Organized Health Care Education/Training Program | Admitting: Student in an Organized Health Care Education/Training Program

## 2019-06-14 ENCOUNTER — Other Ambulatory Visit: Payer: Self-pay

## 2019-06-14 ENCOUNTER — Ambulatory Visit (HOSPITAL_BASED_OUTPATIENT_CLINIC_OR_DEPARTMENT_OTHER): Payer: Medicare HMO | Admitting: Student in an Organized Health Care Education/Training Program

## 2019-06-14 DIAGNOSIS — M5416 Radiculopathy, lumbar region: Secondary | ICD-10-CM | POA: Insufficient documentation

## 2019-06-14 MED ORDER — IOHEXOL 180 MG/ML  SOLN
10.0000 mL | Freq: Once | INTRAMUSCULAR | Status: AC
  Administered 2019-06-14: 10 mL via EPIDURAL

## 2019-06-14 MED ORDER — LIDOCAINE HCL 2 % IJ SOLN
20.0000 mL | Freq: Once | INTRAMUSCULAR | Status: AC
  Administered 2019-06-14: 400 mg
  Filled 2019-06-14: qty 40

## 2019-06-14 MED ORDER — ROPIVACAINE HCL 2 MG/ML IJ SOLN
1.0000 mL | Freq: Once | INTRAMUSCULAR | Status: AC
  Administered 2019-06-14: 1 mL via EPIDURAL
  Filled 2019-06-14: qty 10

## 2019-06-14 MED ORDER — SODIUM CHLORIDE 0.9% FLUSH
1.0000 mL | Freq: Once | INTRAVENOUS | Status: AC
  Administered 2019-06-14: 1 mL

## 2019-06-14 MED ORDER — DEXAMETHASONE SODIUM PHOSPHATE 10 MG/ML IJ SOLN
10.0000 mg | Freq: Once | INTRAMUSCULAR | Status: AC
  Administered 2019-06-14: 10 mg
  Filled 2019-06-14: qty 1

## 2019-06-14 NOTE — Progress Notes (Signed)
Safety precautions to be maintained throughout the outpatient stay will include: orient to surroundings, keep bed in low position, maintain call bell within reach at all times, provide assistance with transfer out of bed and ambulation.  

## 2019-06-14 NOTE — Patient Instructions (Signed)
Pain Management Discharge Instructions  General Discharge Instructions :  If you need to reach your doctor call: Monday-Friday 8:00 am - 4:00 pm at [phone] or toll free [phone].  After clinic hours [phone] to have operator reach doctor.  Bring all of your medication bottles to all your appointments in the pain clinic.  To cancel or reschedule your appointment with Pain Management please remember to call 24 hours in advance to avoid a fee.  Refer to the educational materials which you have been given on: General Risks, I had my Procedure. Discharge Instructions, Post Sedation.  Post Procedure Instructions:  The drugs you were given will stay in your system until tomorrow, so for the next 24 hours you should not drive, make any legal decisions or drink any alcoholic beverages.  You may eat anything you prefer, but it is better to start with liquids then soups and crackers, and gradually work up to solid foods.  Please notify your doctor immediately if you have any unusual bleeding, trouble breathing or pain that is not related to your normal pain.  Depending on the type of procedure that was done, some parts of your body may feel week and/or numb.  This usually clears up by tonight or the next day.  Walk with the use of an assistive device or accompanied by an adult for the 24 hours.  You may use ice on the affected area for the first 24 hours.  Put ice in a Ziploc bag and cover with a towel and place against area 15 minutes on 15 minutes off.  You may switch to heat after 24 hours.Epidural Steroid Injection Patient Information  Description: The epidural space surrounds the nerves as they exit the spinal cord.  In some patients, the nerves can be compressed and inflamed by a bulging disc or a tight spinal canal (spinal stenosis).  By injecting steroids into the epidural space, we can bring irritated nerves into direct contact with a potentially helpful medication.  These  steroids act directly on the irritated nerves and can reduce swelling and inflammation which often leads to decreased pain.  Epidural steroids may be injected anywhere along the spine and from the neck to the low back depending upon the location of your pain.   After numbing the skin with local anesthetic (like Novocaine), a small needle is passed into the epidural space slowly.  You may experience a sensation of pressure while this is being done.  The entire block usually last less than 10 minutes.  Conditions which may be treated by epidural steroids:   Low back and leg pain  Neck and arm pain  Spinal stenosis  Post-laminectomy syndrome  Herpes zoster (shingles) pain  Pain from compression fractures  Preparation for the injection:  1. Do not eat any solid food or dairy products within 8 hours of your appointment.  2. You may drink clear liquids up to 3 hours before appointment.  Clear liquids include water, black coffee, juice or soda.  No milk or cream please. 3. You may take your regular medication, including pain medications, with a sip of water before your appointment  Diabetics should hold regular insulin (if taken separately) and take 1/2 normal NPH dos the morning of the procedure.  Carry some sugar containing items with you to your appointment. 4. A driver must accompany you and be prepared to drive you home after your procedure.  5. Bring all your current medications with your. 6. An IV may be inserted and   sedation may be given at the discretion of the physician.   7. A blood pressure cuff, EKG and other monitors will often be applied during the procedure.  Some patients may need to have extra oxygen administered for a short period. 8. You will be asked to provide medical information, including your allergies, prior to the procedure.  We must know immediately if you are taking blood thinners (like Coumadin/Warfarin)  Or if you are allergic to IV iodine contrast (dye). We must  know if you could possible be pregnant.  Possible side-effects:  Bleeding from needle site  Infection (rare, may require surgery)  Nerve injury (rare)  Numbness & tingling (temporary)  Difficulty urinating (rare, temporary)  Spinal headache ( a headache worse with upright posture)  Light -headedness (temporary)  Pain at injection site (several days)  Decreased blood pressure (temporary)  Weakness in arm/leg (temporary)  Pressure sensation in back/neck (temporary)  Call if you experience:  Fever/chills associated with headache or increased back/neck pain.  Headache worsened by an upright position.  New onset weakness or numbness of an extremity below the injection site  Hives or difficulty breathing (go to the emergency room)  Inflammation or drainage at the infection site  Severe back/neck pain  Any new symptoms which are concerning to you  Please note:  Although the local anesthetic injected can often make your back or neck feel good for several hours after the injection, the pain will likely return.  It takes 3-7 days for steroids to work in the epidural space.  You may not notice any pain relief for at least that one week.  If effective, we will often do a series of three injections spaced 3-6 weeks apart to maximally decrease your pain.  After the initial series, we generally will wait several months before considering a repeat injection of the same type.  If you have any questions, please call [phone] [facility] 

## 2019-06-14 NOTE — Progress Notes (Signed)
Patient's Name: Christine Higgins  MRN: [id number]  Referring Provider: [name], MD  DOB: July 02, 1956  PCP: Kirk Ruths, MD  DOS: 06/14/2019  Note by: [name], MD  Service setting: Ambulatory outpatient  Specialty: Interventional Pain Management  Patient type: Established  Location: [facility] (AMB) Pain Management Facility  Visit type: Interventional Procedure   Primary Reason for Visit: Interventional Pain Management Treatment. CC: Back Pain (lower)  Procedure:          Anesthesia, Analgesia, Anxiolysis:  Type: Therapeutic Inter-Laminar Epidural Steroid Injection  #3  Region: Lumbar Level: L5-S1 Level. Laterality: Right         Type: Local Anesthesia  Local Anesthetic: Lidocaine 1-2%  Position: Prone with head of the table was raised to facilitate breathing.   Indications: 1. Lumbar radiculopathy    Pain Score: Pre-procedure: 8 /10 Post-procedure: 5 /10   Pre-op Assessment:  Christine Higgins is a 63 y.o. (year old), female patient, seen today for interventional treatment. She  has a past surgical history that includes carpel tunn (Right); Hand surgery (Right); carpel tunnel (Left); Cesarean section; Shoulder surgery (Right); Back surgery; Neck surgery; Total hip arthroplasty (Right); Excision Morton's neuroma (Left, 02/05/2017); Colonoscopy; Joint replacement; Esophagogastroduodenoscopy (egd) with propofol (N/A, 06/26/2017); Knee Arthroplasty (Left, 08/11/2017); and Knee arthroscopy (Right, 07/02/2018). Christine Higgins has a current medication list which includes the following prescription(s): acetaminophen, vitamin c, atorvastatin, budesonide-formoterol, calcium carbonate, celecoxib, vitamin d3, ciclopirox, clobetasol cream, clonazepam, clotrimazole-betamethasone, coq-10, vitamin b-12, cyclobenzaprine, cyclobenzaprine, diphenhydramine, duloxetine, duloxetine, esomeprazole, gabapentin, hydralazine, hydroxyzine, linaclotide, losartan-hydrochlorothiazide, menthol (topical analgesic), methimazole,  multiple vitamins-minerals, naphazoline-pheniramine, prenatal mv-min-fe fum-fa-dha, probiotic product, propranolol, propylene glycol, sodium chloride, tizanidine, trazodone, turmeric, vitamin a, vitamin e, ciclopirox, hydrocodone-acetaminophen, hydrocortisone, omeprazole, solifenacin, tramadol, triamcinolone cream, and ventolin hfa. Her primarily concern today is the Back Pain (lower)  Initial Vital Signs:  Pulse/HCG Rate: 79ECG Heart Rate: 78 Temp: (!) 97.3 F (36.3 C) Resp: 16 BP: (!) 142/89 SpO2: 97 %  BMI: Estimated body mass index is 36.26 kg/m as calculated from the following:   Height as of this encounter: 5\' 11"  (1.803 m).   Weight as of this encounter: 260 lb (117.9 kg).  Risk Assessment: Allergies: Reviewed. She is allergic to cephalexin; shellfish allergy; and aspirin.  Allergy Precautions: None required Coagulopathies: Reviewed. None identified.  Blood-thinner therapy: None at this time Active Infection(s): Reviewed. None identified. Christine Higgins is afebrile  Site Confirmation: Christine Higgins was asked to confirm the procedure and laterality before marking the site Procedure checklist: Completed Consent: Before the procedure and under the influence of no sedative(s), amnesic(s), or anxiolytics, the patient was informed of the treatment options, risks and possible complications. To fulfill our ethical and legal obligations, as recommended by the American Medical Association's Code of Ethics, I have informed the patient of my clinical impression; the nature and purpose of the treatment or procedure; the risks, benefits, and possible complications of the intervention; the alternatives, including doing nothing; the risk(s) and benefit(s) of the alternative treatment(s) or procedure(s); and the risk(s) and benefit(s) of doing nothing. The patient was provided information about the general risks and possible complications associated with the procedure. These may include, but are not limited  to: failure to achieve desired goals, infection, bleeding, organ or nerve damage, allergic reactions, paralysis, and death. In addition, the patient was informed of those risks and complications associated to Spine-related procedures, such as failure to decrease pain; infection (i.e.: Meningitis, epidural or intraspinal abscess); bleeding (i.e.: epidural hematoma, subarachnoid hemorrhage, or any other  type of intraspinal or peri-dural bleeding); organ or nerve damage (i.e.: Any type of peripheral nerve, nerve root, or spinal cord injury) with subsequent damage to sensory, motor, and/or autonomic systems, resulting in permanent pain, numbness, and/or weakness of one or several areas of the body; allergic reactions; (i.e.: anaphylactic reaction); and/or death. Furthermore, the patient was informed of those risks and complications associated with the medications. These include, but are not limited to: allergic reactions (i.e.: anaphylactic or anaphylactoid reaction(s)); adrenal axis suppression; blood sugar elevation that in diabetics may result in ketoacidosis or comma; water retention that in patients with history of congestive heart failure may result in shortness of breath, pulmonary edema, and decompensation with resultant heart failure; weight gain; swelling or edema; medication-induced neural toxicity; particulate matter embolism and blood vessel occlusion with resultant organ, and/or nervous system infarction; and/or aseptic necrosis of one or more joints. Finally, the patient was informed that Medicine is not an exact science; therefore, there is also the possibility of unforeseen or unpredictable risks and/or possible complications that may result in a catastrophic outcome. The patient indicated having understood very clearly. We have given the patient no guarantees and we have made no promises. Enough time was given to the patient to ask questions, all of which were answered to the patient's satisfaction.  Christine Higgins has indicated that she wanted to continue with the procedure. Attestation: I, the ordering provider, attest that I have discussed with the patient the benefits, risks, side-effects, alternatives, likelihood of achieving goals, and potential problems during recovery for the procedure that I have provided informed consent. Date  Time:   Pre-Procedure Preparation:  Monitoring: As per clinic protocol. Respiration, ETCO2, SpO2, BP, heart rate and rhythm monitor placed and checked for adequate function Safety Precautions: Patient was assessed for positional comfort and pressure points before starting the procedure. Time-out: I initiated and conducted the "Time-out" before starting the procedure, as per protocol. The patient was asked to participate by confirming the accuracy of the "Time Out" information. Verification of the correct person, site, and procedure were performed and confirmed by me, the nursing staff, and the patient. "Time-out" conducted as per Joint Commission's Universal Protocol (UP.01.01.01). Time: 1046  Description of Procedure:          Target Area: The interlaminar space, initially targeting the lower laminar border of the superior vertebral body. Approach: Paramedial approach. Area Prepped: Entire Posterior Lumbar Region Prepping solution: DuraPrep (Iodine Povacrylex [0.7% available iodine] and Isopropyl Alcohol, 74% w/w) Safety Precautions: Aspiration looking for blood return was conducted prior to all injections. At no point did we inject any substances, as a needle was being advanced. No attempts were made at seeking any paresthesias. Safe injection practices and needle disposal techniques used. Medications properly checked for expiration dates. SDV (single dose vial) medications used. Description of the Procedure: Protocol guidelines were followed. The procedure needle was introduced through the skin, ipsilateral to the reported pain, and advanced to the target area.  Bone was contacted and the needle walked caudad, until the lamina was cleared. The epidural space was identified using "loss-of-resistance technique" with 2-3 ml of PF-NaCl (0.9% NSS), in a 5cc LOR glass syringe.  Vitals:   06/14/19 1041 06/14/19 1046 06/14/19 1051 06/14/19 1055  BP: (!) 145/93 (!) 134/94 (!) 134/94 (!) 146/95  Pulse:      Resp: 16 12 12 12   Temp:      TempSrc:      SpO2: 99% 100% 99% 100%  Weight:  Height:        Start Time: 1046 hrs. End Time: 1055 hrs.  Materials:  Needle(s) Type: Epidural needle Gauge: 17G Length: 5-in Medication(s): Please see orders for medications and dosing details. 6 cc solution made of 3cc of preservative-free saline, 2 cc of 0.2% ropivacaine, 1 cc of Decadron 10 mg/cc.   Imaging Guidance (Spinal):          Type of Imaging Technique: Fluoroscopy Guidance (Spinal) Indication(s): Assistance in needle guidance and placement for procedures requiring needle placement in or near specific anatomical locations not easily accessible without such assistance. Exposure Time: Please see nurses notes. Contrast: Before injecting any contrast, we confirmed that the patient did not have an allergy to iodine, shellfish, or radiological contrast. Once satisfactory needle placement was completed at the desired level, radiological contrast was injected. Contrast injected under live fluoroscopy. No contrast complications. See chart for type and volume of contrast used. Fluoroscopic Guidance: I was personally present during the use of fluoroscopy. "Tunnel Vision Technique" used to obtain the best possible view of the target area. Parallax error corrected before commencing the procedure. "Direction-depth-direction" technique used to introduce the needle under continuous pulsed fluoroscopy. Once target was reached, antero-posterior, oblique, and lateral fluoroscopic projection used confirm needle placement in all planes. Images permanently stored in  EMR. Interpretation: I personally interpreted the imaging intraoperatively. Adequate needle placement confirmed in multiple planes. Appropriate spread of contrast into desired area was observed. No evidence of afferent or efferent intravascular uptake. No intrathecal or subarachnoid spread observed. Permanent images saved into the patient's record.  Antibiotic Prophylaxis:   Anti-infectives (From admission, onward)   None     Indication(s): None identified  Post-operative Assessment:  Post-procedure Vital Signs:  Pulse/HCG Rate: 7975 Temp: (!) 97.3 F (36.3 C) Resp: 12 BP: (!) 146/95 SpO2: 100 %  EBL: None  Complications: No immediate post-treatment complications observed by team, or reported by patient.  Note: The patient tolerated the entire procedure well. A repeat set of vitals were taken after the procedure and the patient was kept under observation following institutional policy, for this type of procedure. Post-procedural neurological assessment was performed, showing return to baseline, prior to discharge. The patient was provided with post-procedure discharge instructions, including a section on how to identify potential problems. Should any problems arise concerning this procedure, the patient was given instructions to immediately contact us, at any time, without hesitation. In any case, we plan to contact the patient by telephone for a follow-up status report regarding this interventional procedure.  Comments:  No additional relevant information. 5 out of 5 strength bilateral lower extremity: Plantar flexion, dorsiflexion, knee flexion, knee extension.  Plan of Care  Orders:  Orders Placed This Encounter  Procedures  . DG PAIN CLINIC C-ARM 1-60 MIN NO REPORT    Intraoperative interpretation by procedural physician at [facility].    Standing Status:   Standing    Number of Occurrences:   1    Order Specific Question:   Reason for exam:    Answer:    Assistance in needle guidance and placement for procedures requiring needle placement in or near specific anatomical locations not easily accessible without such assistance.   Medications ordered for procedure: Meds ordered this encounter  Medications  . iohexol (OMNIPAQUE) 180 MG/ML injection 10 mL    Must be Myelogram-compatible. If not available, you may substitute with a water-soluble, non-ionic, hypoallergenic, myelogram-compatible radiological contrast medium.  Marland Kitchen lidocaine (XYLOCAINE) 2 % (with pres) injection 400  mg  . ropivacaine (PF) 2 mg/mL (0.2%) (NAROPIN) injection 1 mL  . dexamethasone (DECADRON) injection 10 mg  . sodium chloride flush (NS) 0.9 % injection 1 mL   Medications administered: We administered iohexol, lidocaine, ropivacaine (PF) 2 mg/mL (0.2%), dexamethasone, and sodium chloride flush.  See the medical record for exact dosing, route, and time of administration.  Follow-up plan:   Return in about 6 weeks (around 07/26/2019) for Post Procedure Evaluation, virtual.      Status post right L5-S1 ESI 03/23/2019, 6 out of 8 cc injected, #2 on 04/21/19, 5 cc more concentrated, #3 on 06/14/2019: 6 cc   Recent Visits Date Type Provider Dept  05/26/19 Office Visit [name], MD [facility]  04/21/19 Procedure visit [name], MD [facility]  04/08/19 Office Visit [name], MD [facility]  Showing recent visits within past 90 days and meeting all other requirements   Today's Visits Date Type Provider Dept  06/14/19 Procedure visit [name], MD [facility]  Showing today's visits and meeting all other requirements   Future Appointments Date Type Provider Dept  07/26/19 Appointment [name], MD [facility]  Showing future appointments within next 90 days and meeting all other requirements   Disposition: Discharge home  Discharge Date & Time: 06/14/2019; 1110 hrs.   Primary Care Physician:  Kirk Ruths, MD Location: [facility] Note by: [name], MD Date: 06/14/2019; Time: 11:04 AM  Disclaimer:  Medicine is not an exact science. The only guarantee in medicine is that nothing is guaranteed. It is important to note that the decision to proceed with this intervention was based on the information collected from the patient. The Data and conclusions were drawn from the patient's questionnaire, the interview, and the physical examination. Because the information was provided in large part by the patient, it cannot be guaranteed that it has not been purposely or unconsciously manipulated. Every effort has been made to obtain as much relevant data as possible for this evaluation. It is important to note that the conclusions that lead to this procedure are derived in large part from the available data. Always take into account that the treatment will also be dependent on availability of resources and existing treatment guidelines, considered by other Pain Management Practitioners as being common knowledge and practice, at the time of the intervention. For Medico-Legal purposes, it is also important to point out that variation in procedural techniques and pharmacological choices are the acceptable norm. The indications, contraindications, technique, and results of the above procedure should only be interpreted and judged by a Board-Certified Interventional Pain Specialist with extensive familiarity and expertise in the same exact procedure and technique.

## 2019-06-15 ENCOUNTER — Telehealth: Payer: Self-pay

## 2019-06-15 NOTE — Telephone Encounter (Signed)
Denies any  Pain at this time. Instructed to call if needed.

## 2019-06-16 DIAGNOSIS — I10 Essential (primary) hypertension: Secondary | ICD-10-CM | POA: Diagnosis not present

## 2019-06-16 DIAGNOSIS — I700 Atherosclerosis of aorta: Secondary | ICD-10-CM | POA: Diagnosis not present

## 2019-06-16 DIAGNOSIS — Z23 Encounter for immunization: Secondary | ICD-10-CM | POA: Diagnosis not present

## 2019-06-16 DIAGNOSIS — J4520 Mild intermittent asthma, uncomplicated: Secondary | ICD-10-CM | POA: Diagnosis not present

## 2019-06-16 DIAGNOSIS — F325 Major depressive disorder, single episode, in full remission: Secondary | ICD-10-CM | POA: Diagnosis not present

## 2019-06-16 DIAGNOSIS — E7800 Pure hypercholesterolemia, unspecified: Secondary | ICD-10-CM | POA: Diagnosis not present

## 2019-06-16 DIAGNOSIS — Z6835 Body mass index (BMI) 35.0-35.9, adult: Secondary | ICD-10-CM | POA: Diagnosis not present

## 2019-06-16 DIAGNOSIS — E119 Type 2 diabetes mellitus without complications: Secondary | ICD-10-CM | POA: Diagnosis not present

## 2019-06-17 ENCOUNTER — Other Ambulatory Visit: Payer: Medicare HMO

## 2019-06-17 ENCOUNTER — Other Ambulatory Visit: Admission: RE | Admit: 2019-06-17 | Payer: Medicare HMO | Source: Ambulatory Visit

## 2019-06-18 ENCOUNTER — Other Ambulatory Visit: Payer: Self-pay

## 2019-06-18 ENCOUNTER — Other Ambulatory Visit: Payer: Self-pay | Admitting: Physician Assistant

## 2019-06-18 ENCOUNTER — Ambulatory Visit
Admission: RE | Admit: 2019-06-18 | Discharge: 2019-06-18 | Disposition: A | Discharge status: Home / Self Care | Payer: Medicare HMO | Source: Ambulatory Visit | Attending: Physician Assistant | Admitting: Physician Assistant

## 2019-06-18 DIAGNOSIS — R1310 Dysphagia, unspecified: Secondary | ICD-10-CM

## 2019-06-21 ENCOUNTER — Other Ambulatory Visit: Payer: Medicare HMO

## 2019-06-21 ENCOUNTER — Encounter
Admission: RE | Admit: 2019-06-21 | Discharge: 2019-06-21 | Disposition: A | Discharge status: Home / Self Care | Payer: Medicare HMO | Source: Ambulatory Visit | Attending: Surgery | Admitting: Surgery

## 2019-06-21 ENCOUNTER — Other Ambulatory Visit: Payer: Self-pay

## 2019-06-21 DIAGNOSIS — Z471 Aftercare following joint replacement surgery: Secondary | ICD-10-CM | POA: Diagnosis not present

## 2019-06-21 DIAGNOSIS — M25512 Pain in left shoulder: Secondary | ICD-10-CM | POA: Diagnosis not present

## 2019-06-21 DIAGNOSIS — H409 Unspecified glaucoma: Secondary | ICD-10-CM | POA: Diagnosis present

## 2019-06-21 DIAGNOSIS — F418 Other specified anxiety disorders: Secondary | ICD-10-CM | POA: Diagnosis not present

## 2019-06-21 DIAGNOSIS — Z6836 Body mass index (BMI) 36.0-36.9, adult: Secondary | ICD-10-CM | POA: Diagnosis not present

## 2019-06-21 DIAGNOSIS — G8918 Other acute postprocedural pain: Secondary | ICD-10-CM | POA: Diagnosis not present

## 2019-06-21 DIAGNOSIS — E785 Hyperlipidemia, unspecified: Secondary | ICD-10-CM | POA: Diagnosis present

## 2019-06-21 DIAGNOSIS — Z91013 Allergy to seafood: Secondary | ICD-10-CM | POA: Diagnosis not present

## 2019-06-21 DIAGNOSIS — Z20828 Contact with and (suspected) exposure to other viral communicable diseases: Secondary | ICD-10-CM | POA: Diagnosis present

## 2019-06-21 DIAGNOSIS — J45909 Unspecified asthma, uncomplicated: Secondary | ICD-10-CM | POA: Diagnosis present

## 2019-06-21 DIAGNOSIS — Z886 Allergy status to analgesic agent status: Secondary | ICD-10-CM | POA: Diagnosis not present

## 2019-06-21 DIAGNOSIS — M1711 Unilateral primary osteoarthritis, right knee: Secondary | ICD-10-CM | POA: Diagnosis present

## 2019-06-21 DIAGNOSIS — I700 Atherosclerosis of aorta: Secondary | ICD-10-CM | POA: Diagnosis present

## 2019-06-21 DIAGNOSIS — I10 Essential (primary) hypertension: Secondary | ICD-10-CM | POA: Diagnosis present

## 2019-06-21 DIAGNOSIS — M7592 Shoulder lesion, unspecified, left shoulder: Secondary | ICD-10-CM | POA: Diagnosis not present

## 2019-06-21 DIAGNOSIS — Z7951 Long term (current) use of inhaled steroids: Secondary | ICD-10-CM | POA: Diagnosis not present

## 2019-06-21 DIAGNOSIS — Z96641 Presence of right artificial hip joint: Secondary | ICD-10-CM | POA: Diagnosis present

## 2019-06-21 DIAGNOSIS — R0602 Shortness of breath: Secondary | ICD-10-CM | POA: Diagnosis not present

## 2019-06-21 DIAGNOSIS — F419 Anxiety disorder, unspecified: Secondary | ICD-10-CM | POA: Diagnosis present

## 2019-06-21 DIAGNOSIS — K219 Gastro-esophageal reflux disease without esophagitis: Secondary | ICD-10-CM | POA: Diagnosis present

## 2019-06-21 DIAGNOSIS — G894 Chronic pain syndrome: Secondary | ICD-10-CM | POA: Diagnosis present

## 2019-06-21 DIAGNOSIS — M19012 Primary osteoarthritis, left shoulder: Secondary | ICD-10-CM | POA: Diagnosis not present

## 2019-06-21 DIAGNOSIS — Z79899 Other long term (current) drug therapy: Secondary | ICD-10-CM | POA: Diagnosis not present

## 2019-06-21 DIAGNOSIS — E669 Obesity, unspecified: Secondary | ICD-10-CM | POA: Diagnosis present

## 2019-06-21 DIAGNOSIS — Z888 Allergy status to other drugs, medicaments and biological substances status: Secondary | ICD-10-CM | POA: Diagnosis not present

## 2019-06-21 DIAGNOSIS — E119 Type 2 diabetes mellitus without complications: Secondary | ICD-10-CM | POA: Diagnosis not present

## 2019-06-21 DIAGNOSIS — M75122 Complete rotator cuff tear or rupture of left shoulder, not specified as traumatic: Secondary | ICD-10-CM | POA: Diagnosis present

## 2019-06-21 DIAGNOSIS — Z01818 Encounter for other preprocedural examination: Secondary | ICD-10-CM | POA: Insufficient documentation

## 2019-06-21 DIAGNOSIS — J9811 Atelectasis: Secondary | ICD-10-CM | POA: Diagnosis not present

## 2019-06-21 DIAGNOSIS — Z96652 Presence of left artificial knee joint: Secondary | ICD-10-CM | POA: Diagnosis present

## 2019-06-21 DIAGNOSIS — E118 Type 2 diabetes mellitus with unspecified complications: Secondary | ICD-10-CM | POA: Diagnosis present

## 2019-06-21 DIAGNOSIS — M7582 Other shoulder lesions, left shoulder: Secondary | ICD-10-CM | POA: Diagnosis not present

## 2019-06-21 DIAGNOSIS — F329 Major depressive disorder, single episode, unspecified: Secondary | ICD-10-CM | POA: Diagnosis present

## 2019-06-21 DIAGNOSIS — Z981 Arthrodesis status: Secondary | ICD-10-CM | POA: Diagnosis not present

## 2019-06-21 DIAGNOSIS — Z96612 Presence of left artificial shoulder joint: Secondary | ICD-10-CM | POA: Diagnosis not present

## 2019-06-21 HISTORY — DX: Unspecified asthma, uncomplicated: J45.909

## 2019-06-21 HISTORY — DX: Depression, unspecified: F32.A

## 2019-06-21 HISTORY — DX: Anxiety disorder, unspecified: F41.9

## 2019-06-21 LAB — BASIC METABOLIC PANEL
Anion gap: 9 (ref 5–15)
BUN: 7 mg/dL — ABNORMAL LOW (ref 8–23)
CO2: 29 mmol/L (ref 22–32)
Calcium: 9.8 mg/dL (ref 8.9–10.3)
Chloride: 99 mmol/L (ref 98–111)
Creatinine, Ser: 0.57 mg/dL (ref 0.44–1.00)
GFR calc Af Amer: >60 mL/min (ref >60)
GFR calc non Af Amer: >60 mL/min (ref >60)
Glucose, Bld: 109 mg/dL — ABNORMAL HIGH (ref 70–99)
Potassium: 3.9 mmol/L (ref 3.5–5.1)
Sodium: 137 mmol/L (ref 135–145)

## 2019-06-21 LAB — URINALYSIS, COMPLETE (UACMP) WITH MICROSCOPIC
Bacteria, UA: NONE SEEN
Bilirubin Urine: NEGATIVE
Glucose, UA: NEGATIVE mg/dL
Hgb urine dipstick: NEGATIVE
Ketones, ur: NEGATIVE mg/dL
Leukocytes,Ua: NEGATIVE
Nitrite: NEGATIVE
Protein, ur: NEGATIVE mg/dL
Specific Gravity, Urine: 1.003 — ABNORMAL LOW (ref 1.005–1.030)
pH: 7 (ref 5.0–8.0)

## 2019-06-21 LAB — CBC
HCT: 38.1 % (ref 36.0–46.0)
Hemoglobin: 12.5 g/dL (ref 12.0–15.0)
MCH: 27.2 pg (ref 26.0–34.0)
MCHC: 32.8 g/dL (ref 30.0–36.0)
MCV: 82.8 fL (ref 80.0–100.0)
Platelets: 201 10*3/uL (ref 150–400)
RBC: 4.6 MIL/uL (ref 3.87–5.11)
RDW: 14 % (ref 11.5–15.5)
WBC: 3.1 10*3/uL — ABNORMAL LOW (ref 4.0–10.5)
nRBC: 0 % (ref 0.0–0.2)

## 2019-06-21 LAB — TYPE AND SCREEN
ABO/RH(D): B POS
Antibody Screen: NEGATIVE

## 2019-06-21 LAB — SURGICAL PCR SCREEN
MRSA, PCR: NEGATIVE
Staphylococcus aureus: NEGATIVE

## 2019-06-21 NOTE — Patient Instructions (Signed)
Your procedure is scheduled on: 06/24/2019 Thurs Report to Same Day Surgery 2nd floor medical mall [facility] Entrance-take elevator on left to 2nd floor.  Check in with surgery information desk.) To find out your arrival time please call [phone] between 1PM - 3PM on 06/23/2019 Wed  Remember: Instructions that are not followed completely may result in serious medical risk, up to and including death, or upon the discretion of your surgeon and anesthesiologist your surgery may need to be rescheduled.    _x___ 1. Do not eat food after midnight the night before your procedure. You may drink clear liquids up to 2 hours before you are scheduled to arrive at the hospital for your procedure.  Do not drink clear liquids within 2 hours of your scheduled arrival to the hospital.  Clear liquids include  --Water or Apple juice without pulp  --Clear carbohydrate beverage such as ClearFast or Gatorade  --Black Coffee or Clear Tea (No milk, no creamers, do not add anything to                  the coffee or Tea Type 1 and type 2 diabetics should only drink water.   ____Ensure clear carbohydrate drink on the way to the hospital for bariatric patients  ____Ensure clear carbohydrate drink 3 hours before surgery.   No gum chewing or hard candies.     __x__ 2. No Alcohol for 24 hours before or after surgery.   __x__3. No Smoking or e-cigarettes for 24 prior to surgery.  Do not use any chewable tobacco products for at least 6 hour prior to surgery   ____  4. Bring all medications with you on the day of surgery if instructed.    __x__ 5. Notify your doctor if there is any change in your medical condition     (cold, fever, infections).    x___6. On the morning of surgery brush your teeth with toothpaste and water.  You may rinse your mouth with mouth wash if you wish.  Do not swallow any toothpaste or mouthwash.   Do not wear jewelry, make-up, hairpins, clips or nail polish.  Do not wear lotions,  powders, or perfumes. You may wear deodorant.  Do not shave 48 hours prior to surgery. Men may shave face and neck.  Do not bring valuables to the hospital.    [facility] is not responsible for any belongings or valuables.               Contacts, dentures or bridgework may not be worn into surgery.  Leave your suitcase in the car. After surgery it may be brought to your room.  For patients admitted to the hospital, discharge time is determined by your                       treatment team.  _  Patients discharged the day of surgery will not be allowed to drive home.  You will need someone to drive you home and stay with you the night of your procedure.    Please read over the following fact sheets that you were given:   [facility] Preparing for Surgery and or MRSA Information   _x___ Take anti-hypertensive listed below, cardiac, seizure, asthma,     anti-reflux and psychiatric medicines. These include:  1. budesonide-formoterol (SYMBICORT) 160-4.5 MCG/ACT inhaler  2. No  DULoxetine (CYMBALTA) capsule     3.esomeprazole (NEXIUM) 40 MG capsule  4.gabapentin (NEURONTIN) 300 MG  capsule  5.hydrALAZINE (APRESOLINE) 25 MG tablet  6.methimazole (TAPAZOLE) 10 MG tablet  7.propranolol (INDERAL) 40 MG tablet  ____Fleets enema or Magnesium Citrate as directed.   _x___ Use CHG Soap or sage wipes as directed on instruction sheet   ____ Use inhalers on the day of surgery and bring to hospital day of surgery  ____ Stop Metformin and Janumet 2 days prior to surgery.    ____ Take 1/2 of usual insulin dose the night before surgery and none on the morning     surgery.   _x___ Follow recommendations from Cardiologist, Pulmonologist or PCP regarding          stopping Aspirin, Coumadin, Plavix ,Eliquis, Effient, or Pradaxa, and Pletal.  X____Stop Anti-inflammatories such as Advil, Aleve, Ibuprofen, Motrin, Naproxen, Naprosyn, Goodies powders or aspirin products. OK to take Tylenol and                           Celebrex.   _x___ Stop supplements until after surgery.  But may continue Vitamin D, Vitamin B,       and multivitamin.   ____ Bring C-Pap to the hospital.

## 2019-06-22 LAB — URINE CULTURE: Culture: 80000 — AB

## 2019-06-22 LAB — SARS CORONAVIRUS 2 (TAT 6-24 HRS): SARS Coronavirus 2: NEGATIVE

## 2019-06-24 ENCOUNTER — Encounter
Admission: RE | Disposition: A | Discharge status: Skilled Nursing Facility | Payer: Self-pay | Source: Home / Self Care | Attending: Surgery

## 2019-06-24 ENCOUNTER — Other Ambulatory Visit: Payer: Self-pay

## 2019-06-24 ENCOUNTER — Ambulatory Visit: Payer: Self-pay

## 2019-06-24 ENCOUNTER — Inpatient Hospital Stay
Admission: RE | Admit: 2019-06-24 | Discharge: 2019-06-28 | DRG: 483 | Disposition: A | Discharge status: Home Health | Payer: Medicare HMO | Attending: Surgery | Admitting: Surgery

## 2019-06-24 ENCOUNTER — Inpatient Hospital Stay: Payer: Medicare HMO

## 2019-06-24 ENCOUNTER — Inpatient Hospital Stay: Payer: Medicare HMO | Admitting: Certified Registered Nurse Anesthetist

## 2019-06-24 ENCOUNTER — Encounter: Payer: Self-pay | Admitting: *Deleted

## 2019-06-24 DIAGNOSIS — Z79899 Other long term (current) drug therapy: Secondary | ICD-10-CM | POA: Diagnosis not present

## 2019-06-24 DIAGNOSIS — F419 Anxiety disorder, unspecified: Secondary | ICD-10-CM | POA: Diagnosis present

## 2019-06-24 DIAGNOSIS — Z6836 Body mass index (BMI) 36.0-36.9, adult: Secondary | ICD-10-CM | POA: Diagnosis not present

## 2019-06-24 DIAGNOSIS — M545 Low back pain: Secondary | ICD-10-CM | POA: Diagnosis not present

## 2019-06-24 DIAGNOSIS — M1711 Unilateral primary osteoarthritis, right knee: Secondary | ICD-10-CM | POA: Diagnosis present

## 2019-06-24 DIAGNOSIS — J9811 Atelectasis: Secondary | ICD-10-CM | POA: Diagnosis not present

## 2019-06-24 DIAGNOSIS — G894 Chronic pain syndrome: Secondary | ICD-10-CM | POA: Diagnosis present

## 2019-06-24 DIAGNOSIS — Z471 Aftercare following joint replacement surgery: Secondary | ICD-10-CM | POA: Diagnosis not present

## 2019-06-24 DIAGNOSIS — Z20828 Contact with and (suspected) exposure to other viral communicable diseases: Secondary | ICD-10-CM | POA: Diagnosis present

## 2019-06-24 DIAGNOSIS — I10 Essential (primary) hypertension: Secondary | ICD-10-CM | POA: Diagnosis present

## 2019-06-24 DIAGNOSIS — Z7951 Long term (current) use of inhaled steroids: Secondary | ICD-10-CM | POA: Diagnosis not present

## 2019-06-24 DIAGNOSIS — F329 Major depressive disorder, single episode, unspecified: Secondary | ICD-10-CM | POA: Diagnosis present

## 2019-06-24 DIAGNOSIS — E785 Hyperlipidemia, unspecified: Secondary | ICD-10-CM | POA: Diagnosis present

## 2019-06-24 DIAGNOSIS — Z981 Arthrodesis status: Secondary | ICD-10-CM

## 2019-06-24 DIAGNOSIS — R0602 Shortness of breath: Secondary | ICD-10-CM | POA: Diagnosis not present

## 2019-06-24 DIAGNOSIS — E669 Obesity, unspecified: Secondary | ICD-10-CM | POA: Diagnosis present

## 2019-06-24 DIAGNOSIS — I700 Atherosclerosis of aorta: Secondary | ICD-10-CM | POA: Diagnosis present

## 2019-06-24 DIAGNOSIS — J45909 Unspecified asthma, uncomplicated: Secondary | ICD-10-CM | POA: Diagnosis present

## 2019-06-24 DIAGNOSIS — Z96641 Presence of right artificial hip joint: Secondary | ICD-10-CM | POA: Diagnosis present

## 2019-06-24 DIAGNOSIS — Z91013 Allergy to seafood: Secondary | ICD-10-CM | POA: Diagnosis not present

## 2019-06-24 DIAGNOSIS — Z7984 Long term (current) use of oral hypoglycemic drugs: Secondary | ICD-10-CM

## 2019-06-24 DIAGNOSIS — K219 Gastro-esophageal reflux disease without esophagitis: Secondary | ICD-10-CM | POA: Diagnosis present

## 2019-06-24 DIAGNOSIS — Z888 Allergy status to other drugs, medicaments and biological substances status: Secondary | ICD-10-CM | POA: Diagnosis not present

## 2019-06-24 DIAGNOSIS — Z96652 Presence of left artificial knee joint: Secondary | ICD-10-CM | POA: Diagnosis present

## 2019-06-24 DIAGNOSIS — H409 Unspecified glaucoma: Secondary | ICD-10-CM | POA: Diagnosis present

## 2019-06-24 DIAGNOSIS — G8918 Other acute postprocedural pain: Secondary | ICD-10-CM | POA: Diagnosis not present

## 2019-06-24 DIAGNOSIS — E118 Type 2 diabetes mellitus with unspecified complications: Secondary | ICD-10-CM | POA: Diagnosis present

## 2019-06-24 DIAGNOSIS — E119 Type 2 diabetes mellitus without complications: Secondary | ICD-10-CM | POA: Diagnosis not present

## 2019-06-24 DIAGNOSIS — M75122 Complete rotator cuff tear or rupture of left shoulder, not specified as traumatic: Principal | ICD-10-CM | POA: Diagnosis present

## 2019-06-24 DIAGNOSIS — F418 Other specified anxiety disorders: Secondary | ICD-10-CM | POA: Diagnosis not present

## 2019-06-24 DIAGNOSIS — R400 Somnolence: Secondary | ICD-10-CM | POA: Diagnosis not present

## 2019-06-24 DIAGNOSIS — Z96612 Presence of left artificial shoulder joint: Secondary | ICD-10-CM | POA: Diagnosis not present

## 2019-06-24 DIAGNOSIS — M7592 Shoulder lesion, unspecified, left shoulder: Secondary | ICD-10-CM | POA: Diagnosis not present

## 2019-06-24 DIAGNOSIS — Z886 Allergy status to analgesic agent status: Secondary | ICD-10-CM | POA: Diagnosis not present

## 2019-06-24 DIAGNOSIS — M19012 Primary osteoarthritis, left shoulder: Secondary | ICD-10-CM | POA: Diagnosis not present

## 2019-06-24 DIAGNOSIS — M7582 Other shoulder lesions, left shoulder: Secondary | ICD-10-CM | POA: Diagnosis not present

## 2019-06-24 DIAGNOSIS — M25512 Pain in left shoulder: Secondary | ICD-10-CM | POA: Diagnosis not present

## 2019-06-24 HISTORY — PX: REVERSE SHOULDER ARTHROPLASTY: SHX5054

## 2019-06-24 LAB — ABO/RH: ABO/RH(D): B POS

## 2019-06-24 SURGERY — ARTHROPLASTY, SHOULDER, TOTAL, REVERSE
Anesthesia: General | Site: Shoulder | Laterality: Left

## 2019-06-24 MED ORDER — DULOXETINE HCL 30 MG PO CPEP
90.0000 mg | ORAL_CAPSULE | Freq: Every evening | ORAL | Status: DC
  Administered 2019-06-24 – 2019-06-27 (×4): 90 mg via ORAL
  Filled 2019-06-24 (×5): qty 3

## 2019-06-24 MED ORDER — SUCRALFATE 1 G PO TABS
1.0000 g | ORAL_TABLET | Freq: Two times a day (BID) | ORAL | Status: DC
  Administered 2019-06-24 – 2019-06-28 (×10): 1 g via ORAL
  Filled 2019-06-24 (×11): qty 1

## 2019-06-24 MED ORDER — KETOROLAC TROMETHAMINE 15 MG/ML IJ SOLN
15.0000 mg | Freq: Four times a day (QID) | INTRAMUSCULAR | Status: AC
  Administered 2019-06-24 – 2019-06-25 (×4): 15 mg via INTRAVENOUS
  Filled 2019-06-24 (×4): qty 1

## 2019-06-24 MED ORDER — PANTOPRAZOLE SODIUM 40 MG PO TBEC
40.0000 mg | DELAYED_RELEASE_TABLET | Freq: Every day | ORAL | Status: DC
  Administered 2019-06-25 – 2019-06-28 (×4): 40 mg via ORAL
  Filled 2019-06-24 (×4): qty 1

## 2019-06-24 MED ORDER — PROPYLENE GLYCOL 0.6 % OP SOLN: 1.0000 [drp] | Freq: Two times a day (BID) | OPHTHALMIC | Status: DC

## 2019-06-24 MED ORDER — DOCUSATE SODIUM 100 MG PO CAPS
100.0000 mg | ORAL_CAPSULE | Freq: Two times a day (BID) | ORAL | Status: DC
  Administered 2019-06-24 – 2019-06-28 (×8): 100 mg via ORAL
  Filled 2019-06-24 (×7): qty 1

## 2019-06-24 MED ORDER — FENTANYL CITRATE (PF) 100 MCG/2ML IJ SOLN
INTRAMUSCULAR | Status: AC
  Administered 2019-06-24: 50 ug via INTRAVENOUS
  Filled 2019-06-24: qty 2

## 2019-06-24 MED ORDER — DIPHENHYDRAMINE HCL 12.5 MG/5ML PO ELIX: 12.5000 mg | ORAL_SOLUTION | ORAL | Status: DC | PRN

## 2019-06-24 MED ORDER — MIDAZOLAM HCL 2 MG/2ML IJ SOLN
INTRAMUSCULAR | Status: AC
  Administered 2019-06-24: 2 mg via INTRAVENOUS
  Filled 2019-06-24: qty 2

## 2019-06-24 MED ORDER — MIDAZOLAM HCL 2 MG/2ML IJ SOLN
1.0000 mg | Freq: Once | INTRAMUSCULAR | Status: AC
  Administered 2019-06-24: 08:00:00 2 mg via INTRAVENOUS

## 2019-06-24 MED ORDER — METHIMAZOLE 10 MG PO TABS
10.0000 mg | ORAL_TABLET | Freq: Every day | ORAL | Status: DC
  Administered 2019-06-25 – 2019-06-28 (×4): 10 mg via ORAL
  Filled 2019-06-24 (×5): qty 1

## 2019-06-24 MED ORDER — GABAPENTIN 300 MG PO CAPS
600.0000 mg | ORAL_CAPSULE | Freq: Three times a day (TID) | ORAL | Status: DC
  Administered 2019-06-24 – 2019-06-28 (×12): 600 mg via ORAL
  Filled 2019-06-24 (×12): qty 2

## 2019-06-24 MED ORDER — METOCLOPRAMIDE HCL 5 MG/ML IJ SOLN: 5.0000 mg | Freq: Three times a day (TID) | INTRAMUSCULAR | Status: DC | PRN

## 2019-06-24 MED ORDER — TRAMADOL HCL 50 MG PO TABS: 50.0000 mg | ORAL_TABLET | Freq: Four times a day (QID) | ORAL | Status: DC | PRN

## 2019-06-24 MED ORDER — FENTANYL CITRATE (PF) 250 MCG/5ML IJ SOLN
INTRAMUSCULAR | Status: AC
  Filled 2019-06-24: qty 5

## 2019-06-24 MED ORDER — CLINDAMYCIN PHOSPHATE 900 MG/50ML IV SOLN
INTRAVENOUS | Status: AC
  Filled 2019-06-24: qty 50

## 2019-06-24 MED ORDER — ENOXAPARIN SODIUM 40 MG/0.4ML ~~LOC~~ SOLN
40.0000 mg | SUBCUTANEOUS | Status: DC
  Administered 2019-06-25 – 2019-06-28 (×4): 40 mg via SUBCUTANEOUS
  Filled 2019-06-24 (×4): qty 0.4

## 2019-06-24 MED ORDER — ONDANSETRON HCL 4 MG/2ML IJ SOLN
INTRAMUSCULAR | Status: DC | PRN
  Administered 2019-06-24: 4 mg via INTRAVENOUS

## 2019-06-24 MED ORDER — PHENYLEPHRINE HCL (PRESSORS) 10 MG/ML IV SOLN
INTRAVENOUS | Status: AC
  Filled 2019-06-24: qty 2

## 2019-06-24 MED ORDER — TURMERIC 500 MG PO TABS: 1000.0000 mg | ORAL_TABLET | Freq: Every day | ORAL | Status: DC

## 2019-06-24 MED ORDER — FENTANYL CITRATE (PF) 100 MCG/2ML IJ SOLN: 25.0000 ug | INTRAMUSCULAR | Status: DC | PRN

## 2019-06-24 MED ORDER — CALCIUM CARBONATE 1250 (500 CA) MG PO TABS
1.0000 | ORAL_TABLET | Freq: Every evening | ORAL | Status: DC
  Administered 2019-06-25: 500 mg via ORAL
  Filled 2019-06-24: qty 1

## 2019-06-24 MED ORDER — LOSARTAN POTASSIUM 50 MG PO TABS
100.0000 mg | ORAL_TABLET | Freq: Every day | ORAL | Status: DC
  Administered 2019-06-25 – 2019-06-28 (×4): 100 mg via ORAL
  Filled 2019-06-24 (×4): qty 2

## 2019-06-24 MED ORDER — LACTATED RINGERS IV SOLN
INTRAVENOUS | Status: DC
  Administered 2019-06-24: 08:00:00 via INTRAVENOUS

## 2019-06-24 MED ORDER — HYDROCHLOROTHIAZIDE 25 MG PO TABS
25.0000 mg | ORAL_TABLET | Freq: Every day | ORAL | Status: DC
  Administered 2019-06-25 – 2019-06-28 (×4): 25 mg via ORAL
  Filled 2019-06-24 (×4): qty 1

## 2019-06-24 MED ORDER — CLINDAMYCIN PHOSPHATE 900 MG/50ML IV SOLN
900.0000 mg | Freq: Once | INTRAVENOUS | Status: AC
  Administered 2019-06-24: 900 mg via INTRAVENOUS

## 2019-06-24 MED ORDER — LIDOCAINE HCL (PF) 2 % IJ SOLN
INTRAMUSCULAR | Status: AC
  Filled 2019-06-24: qty 10

## 2019-06-24 MED ORDER — CYCLOBENZAPRINE HCL 10 MG PO TABS
5.0000 mg | ORAL_TABLET | Freq: Two times a day (BID) | ORAL | Status: DC | PRN
  Administered 2019-06-27: 5 mg via ORAL
  Filled 2019-06-24: qty 1

## 2019-06-24 MED ORDER — SUGAMMADEX SODIUM 200 MG/2ML IV SOLN
INTRAVENOUS | Status: AC
  Filled 2019-06-24: qty 2

## 2019-06-24 MED ORDER — BUPIVACAINE HCL (PF) 0.5 % IJ SOLN
INTRAMUSCULAR | Status: DC | PRN
  Administered 2019-06-24: 10 mL via PERINEURAL

## 2019-06-24 MED ORDER — LOSARTAN POTASSIUM-HCTZ 100-25 MG PO TABS: 1.0000 | ORAL_TABLET | Freq: Every day | ORAL | Status: DC

## 2019-06-24 MED ORDER — CIPROFLOXACIN HCL 500 MG PO TABS
750.0000 mg | ORAL_TABLET | Freq: Two times a day (BID) | ORAL | Status: AC
  Administered 2019-06-24 – 2019-06-25 (×2): 750 mg via ORAL
  Filled 2019-06-24 (×2): qty 1.5

## 2019-06-24 MED ORDER — RISAQUAD PO CAPS
ORAL_CAPSULE | Freq: Every day | ORAL | Status: DC
  Administered 2019-06-25 – 2019-06-28 (×4): 1 via ORAL
  Filled 2019-06-24 (×5): qty 1

## 2019-06-24 MED ORDER — DULOXETINE HCL 60 MG PO CPEP: 60.0000 mg | ORAL_CAPSULE | Freq: Every evening | ORAL | Status: DC

## 2019-06-24 MED ORDER — FENTANYL CITRATE (PF) 100 MCG/2ML IJ SOLN
INTRAMUSCULAR | Status: DC | PRN
  Administered 2019-06-24 (×3): 50 ug via INTRAVENOUS

## 2019-06-24 MED ORDER — NAPHAZOLINE-PHENIRAMINE 0.027-0.315 % OP SOLN: Freq: Four times a day (QID) | OPHTHALMIC | Status: DC | PRN

## 2019-06-24 MED ORDER — ROCURONIUM BROMIDE 50 MG/5ML IV SOLN
INTRAVENOUS | Status: AC
  Filled 2019-06-24: qty 2

## 2019-06-24 MED ORDER — KETOROLAC TROMETHAMINE 30 MG/ML IJ SOLN
30.0000 mg | Freq: Once | INTRAMUSCULAR | Status: AC
  Administered 2019-06-24: 30 mg via INTRAVENOUS

## 2019-06-24 MED ORDER — ONDANSETRON HCL 4 MG/2ML IJ SOLN: 4.0000 mg | Freq: Four times a day (QID) | INTRAMUSCULAR | Status: DC | PRN

## 2019-06-24 MED ORDER — MIDAZOLAM HCL 2 MG/2ML IJ SOLN
INTRAMUSCULAR | Status: AC
  Filled 2019-06-24: qty 2

## 2019-06-24 MED ORDER — VITAMIN D 25 MCG (1000 UNIT) PO TABS
2000.0000 [IU] | ORAL_TABLET | Freq: Every evening | ORAL | Status: DC
  Administered 2019-06-25 – 2019-06-27 (×3): 2000 [IU] via ORAL
  Filled 2019-06-24 (×4): qty 2

## 2019-06-24 MED ORDER — ACETAMINOPHEN 500 MG PO TABS
1000.0000 mg | ORAL_TABLET | Freq: Four times a day (QID) | ORAL | Status: AC
  Administered 2019-06-24 – 2019-06-25 (×3): 1000 mg via ORAL
  Filled 2019-06-24 (×5): qty 2

## 2019-06-24 MED ORDER — ONDANSETRON HCL 4 MG PO TABS: 4.0000 mg | ORAL_TABLET | Freq: Four times a day (QID) | ORAL | Status: DC | PRN

## 2019-06-24 MED ORDER — PROPOFOL 10 MG/ML IV BOLUS
INTRAVENOUS | Status: AC
  Filled 2019-06-24: qty 20

## 2019-06-24 MED ORDER — DIPHENHYDRAMINE HCL 25 MG PO CAPS
50.0000 mg | ORAL_CAPSULE | Freq: Two times a day (BID) | ORAL | Status: DC | PRN
  Administered 2019-06-28: 50 mg via ORAL
  Filled 2019-06-24: qty 2

## 2019-06-24 MED ORDER — EPINEPHRINE PF 1 MG/ML IJ SOLN
INTRAMUSCULAR | Status: AC
  Filled 2019-06-24: qty 1

## 2019-06-24 MED ORDER — COQ-10 200 MG PO CAPS: 1.0000 | ORAL_CAPSULE | Freq: Every day | ORAL | Status: DC

## 2019-06-24 MED ORDER — FLEET ENEMA 7-19 GM/118ML RE ENEM: 1.0000 | ENEMA | Freq: Once | RECTAL | Status: DC | PRN

## 2019-06-24 MED ORDER — NALOXONE HCL 0.4 MG/ML IJ SOLN
INTRAMUSCULAR | Status: DC | PRN
  Administered 2019-06-24: 120 ug via INTRAVENOUS

## 2019-06-24 MED ORDER — SODIUM CHLORIDE 0.9 % IV SOLN
INTRAVENOUS | Status: DC
  Administered 2019-06-24: 16:00:00 via INTRAVENOUS

## 2019-06-24 MED ORDER — ONDANSETRON HCL 4 MG/2ML IJ SOLN
INTRAMUSCULAR | Status: AC
  Filled 2019-06-24: qty 2

## 2019-06-24 MED ORDER — PROPOFOL 10 MG/ML IV BOLUS
INTRAVENOUS | Status: DC | PRN
  Administered 2019-06-24: 20 mg via INTRAVENOUS
  Administered 2019-06-24: 150 mg via INTRAVENOUS

## 2019-06-24 MED ORDER — CEFAZOLIN SODIUM-DEXTROSE 2-4 GM/100ML-% IV SOLN: 2.0000 g | Freq: Once | INTRAVENOUS | Status: DC

## 2019-06-24 MED ORDER — CLOTRIMAZOLE 1 % EX CREA
TOPICAL_CREAM | Freq: Two times a day (BID) | CUTANEOUS | Status: DC | PRN
  Administered 2019-06-28: 11:00:00 via TOPICAL
  Filled 2019-06-24: qty 15

## 2019-06-24 MED ORDER — METOCLOPRAMIDE HCL 10 MG PO TABS: 5.0000 mg | ORAL_TABLET | Freq: Three times a day (TID) | ORAL | Status: DC | PRN

## 2019-06-24 MED ORDER — MOMETASONE FURO-FORMOTEROL FUM 200-5 MCG/ACT IN AERO
2.0000 | INHALATION_SPRAY | Freq: Two times a day (BID) | RESPIRATORY_TRACT | Status: DC
  Administered 2019-06-24 – 2019-06-28 (×8): 2 via RESPIRATORY_TRACT
  Filled 2019-06-24: qty 8.8

## 2019-06-24 MED ORDER — VITAMIN E 180 MG (400 UNIT) PO CAPS
400.0000 [IU] | ORAL_CAPSULE | Freq: Every day | ORAL | Status: DC
  Administered 2019-06-25 – 2019-06-28 (×4): 400 [IU] via ORAL
  Filled 2019-06-24 (×5): qty 1

## 2019-06-24 MED ORDER — PHENYLEPHRINE HCL-NACL 10-0.9 MG/250ML-% IV SOLN
INTRAVENOUS | Status: DC | PRN
  Administered 2019-06-24: 30 ug/min via INTRAVENOUS

## 2019-06-24 MED ORDER — ACETAMINOPHEN 325 MG PO TABS: 325.0000 mg | ORAL_TABLET | Freq: Four times a day (QID) | ORAL | Status: DC | PRN

## 2019-06-24 MED ORDER — HYDROMORPHONE HCL 1 MG/ML IJ SOLN: 0.2500 mg | INTRAMUSCULAR | Status: DC | PRN

## 2019-06-24 MED ORDER — LINACLOTIDE 290 MCG PO CAPS
290.0000 ug | ORAL_CAPSULE | Freq: Every day | ORAL | Status: DC | PRN
  Administered 2019-06-24 – 2019-06-28 (×3): 290 ug via ORAL
  Filled 2019-06-24 (×5): qty 1

## 2019-06-24 MED ORDER — VITAMIN C 500 MG PO TABS
500.0000 mg | ORAL_TABLET | Freq: Two times a day (BID) | ORAL | Status: DC
  Administered 2019-06-25 – 2019-06-28 (×7): 500 mg via ORAL
  Filled 2019-06-24 (×7): qty 1

## 2019-06-24 MED ORDER — ONDANSETRON HCL 4 MG/2ML IJ SOLN: 4.0000 mg | Freq: Once | INTRAMUSCULAR | Status: DC | PRN

## 2019-06-24 MED ORDER — HYDRALAZINE HCL 25 MG PO TABS: 25.0000 mg | ORAL_TABLET | Freq: Three times a day (TID) | ORAL | Status: DC | PRN

## 2019-06-24 MED ORDER — LIDOCAINE HCL (PF) 1 % IJ SOLN
INTRAMUSCULAR | Status: DC | PRN
  Administered 2019-06-24: .8 mL via SUBCUTANEOUS

## 2019-06-24 MED ORDER — LIDOCAINE HCL (CARDIAC) PF 100 MG/5ML IV SOSY
PREFILLED_SYRINGE | INTRAVENOUS | Status: DC | PRN
  Administered 2019-06-24 (×2): 100 mg via INTRAVENOUS

## 2019-06-24 MED ORDER — LIDOCAINE HCL (PF) 1 % IJ SOLN
INTRAMUSCULAR | Status: AC
  Filled 2019-06-24: qty 5

## 2019-06-24 MED ORDER — BUPIVACAINE-EPINEPHRINE (PF) 0.5% -1:200000 IJ SOLN
INTRAMUSCULAR | Status: DC | PRN
  Administered 2019-06-24: 30 mL via PERINEURAL

## 2019-06-24 MED ORDER — BUPIVACAINE LIPOSOME 1.3 % IJ SUSP
INTRAMUSCULAR | Status: AC
  Filled 2019-06-24: qty 20

## 2019-06-24 MED ORDER — KETOROLAC TROMETHAMINE 30 MG/ML IJ SOLN
INTRAMUSCULAR | Status: AC
  Administered 2019-06-24: 19:00:00
  Filled 2019-06-24: qty 1

## 2019-06-24 MED ORDER — BUPIVACAINE LIPOSOME 1.3 % IJ SUSP
INTRAMUSCULAR | Status: DC | PRN
  Administered 2019-06-24: 20 mL via PERINEURAL

## 2019-06-24 MED ORDER — CLOBETASOL PROPIONATE 0.05 % EX CREA
1.0000 "application " | TOPICAL_CREAM | Freq: Two times a day (BID) | CUTANEOUS | Status: DC | PRN
  Filled 2019-06-24: qty 15

## 2019-06-24 MED ORDER — HYDROXYZINE HCL 10 MG PO TABS
30.0000 mg | ORAL_TABLET | Freq: Every day | ORAL | Status: DC
  Administered 2019-06-24 – 2019-06-27 (×4): 30 mg via ORAL
  Filled 2019-06-24 (×5): qty 3

## 2019-06-24 MED ORDER — SALINE SPRAY 0.65 % NA SOLN
1.0000 | Freq: Every day | NASAL | Status: DC | PRN
  Administered 2019-06-28: 2 via NASAL
  Filled 2019-06-24: qty 44

## 2019-06-24 MED ORDER — OXYCODONE HCL 5 MG PO TABS
5.0000 mg | ORAL_TABLET | ORAL | Status: DC | PRN
  Administered 2019-06-24: 5 mg via ORAL
  Administered 2019-06-24 – 2019-06-28 (×8): 10 mg via ORAL
  Filled 2019-06-24 (×9): qty 2
  Filled 2019-06-24: qty 1

## 2019-06-24 MED ORDER — FENTANYL CITRATE (PF) 100 MCG/2ML IJ SOLN
50.0000 ug | Freq: Once | INTRAMUSCULAR | Status: AC
  Administered 2019-06-24: 08:00:00 50 ug via INTRAVENOUS

## 2019-06-24 MED ORDER — BISACODYL 10 MG RE SUPP: 10.0000 mg | Freq: Every day | RECTAL | Status: DC | PRN

## 2019-06-24 MED ORDER — TRANEXAMIC ACID 1000 MG/10ML IV SOLN
INTRAVENOUS | Status: DC | PRN
  Administered 2019-06-24: 1000 mg via TOPICAL

## 2019-06-24 MED ORDER — CLONAZEPAM 1 MG PO TABS
1.0000 mg | ORAL_TABLET | Freq: Every evening | ORAL | Status: DC | PRN
  Administered 2019-06-24 – 2019-06-26 (×3): 1 mg via ORAL
  Filled 2019-06-24 (×3): qty 1

## 2019-06-24 MED ORDER — BUPIVACAINE HCL (PF) 0.5 % IJ SOLN
INTRAMUSCULAR | Status: AC
  Filled 2019-06-24: qty 10

## 2019-06-24 MED ORDER — CLINDAMYCIN PHOSPHATE 900 MG/50ML IV SOLN
900.0000 mg | Freq: Four times a day (QID) | INTRAVENOUS | Status: AC
  Administered 2019-06-24 – 2019-06-25 (×2): 900 mg via INTRAVENOUS
  Filled 2019-06-24 (×2): qty 50

## 2019-06-24 MED ORDER — PROPRANOLOL HCL 20 MG PO TABS
40.0000 mg | ORAL_TABLET | Freq: Two times a day (BID) | ORAL | Status: DC
  Administered 2019-06-24 – 2019-06-28 (×8): 40 mg via ORAL
  Filled 2019-06-24 (×8): qty 2

## 2019-06-24 MED ORDER — ALIVE WOMENS ENERGY PO TABS: ORAL_TABLET | Freq: Every day | ORAL | Status: DC

## 2019-06-24 MED ORDER — VITAMIN B-12 1000 MCG PO TABS
5000.0000 ug | ORAL_TABLET | Freq: Every evening | ORAL | Status: DC
  Administered 2019-06-25 – 2019-06-27 (×3): 5000 ug via ORAL
  Filled 2019-06-24 (×4): qty 5

## 2019-06-24 MED ORDER — CLINDAMYCIN PHOSPHATE 900 MG/50ML IV SOLN
INTRAVENOUS | Status: AC
  Administered 2019-06-24: 900 mg
  Filled 2019-06-24: qty 50

## 2019-06-24 MED ORDER — MAGNESIUM HYDROXIDE 400 MG/5ML PO SUSP
30.0000 mL | Freq: Every day | ORAL | Status: DC | PRN
  Filled 2019-06-24: qty 30

## 2019-06-24 MED ORDER — PRENATAL 1 30-0.975-200 MG PO CAPS: ORAL_CAPSULE | Freq: Every day | ORAL | Status: DC

## 2019-06-24 MED ORDER — ROCURONIUM BROMIDE 100 MG/10ML IV SOLN
INTRAVENOUS | Status: DC | PRN
  Administered 2019-06-24: 100 mg via INTRAVENOUS

## 2019-06-24 MED ORDER — ATORVASTATIN CALCIUM 10 MG PO TABS
10.0000 mg | ORAL_TABLET | Freq: Every day | ORAL | Status: DC
  Administered 2019-06-24 – 2019-06-27 (×4): 10 mg via ORAL
  Filled 2019-06-24 (×4): qty 1

## 2019-06-24 MED ORDER — TRAZODONE HCL 50 MG PO TABS
150.0000 mg | ORAL_TABLET | Freq: Every evening | ORAL | Status: DC | PRN
  Administered 2019-06-24 – 2019-06-25 (×2): 150 mg via ORAL
  Filled 2019-06-24 (×2): qty 1

## 2019-06-24 SURGICAL SUPPLY — 69 items
APL PRP STRL LF DISP 70% ISPRP (MISCELLANEOUS) ×1
BASEPLATE GLENOSPHERE 25 (Plate) ×1 IMPLANT
BEARING HUMERAL SHLDER 36M STD (Shoulder) IMPLANT
BIT DRILL TWIST 2.7 (BIT) ×1 IMPLANT
BLADE SAW SAG 25X90X1.19 (BLADE) ×2 IMPLANT
BRNG HUM STD 36 RVRS SHLDR (Shoulder) ×1 IMPLANT
CANISTER SUCT 1200ML W/VALVE (MISCELLANEOUS) ×2 IMPLANT
CANISTER SUCT 3000ML PPV (MISCELLANEOUS) ×4 IMPLANT
CHLORAPREP W/TINT 26 (MISCELLANEOUS) ×2 IMPLANT
COOLER POLAR GLACIER W/PUMP (MISCELLANEOUS) ×2 IMPLANT
COVER BACK TABLE REUSABLE LG (DRAPES) ×2 IMPLANT
COVER WAND RF STERILE (DRAPES) ×2 IMPLANT
CRADLE LAMINECT ARM (MISCELLANEOUS) ×2 IMPLANT
DRAPE 3/4 80X56 (DRAPES) ×4 IMPLANT
DRAPE INCISE IOBAN 66X45 STRL (DRAPES) ×4 IMPLANT
DRAPE SPLIT 6X30 W/TAPE (DRAPES) ×4 IMPLANT
DRSG OPSITE POSTOP 4X8 (GAUZE/BANDAGES/DRESSINGS) ×2 IMPLANT
ELECT BLADE 6.5 EXT (BLADE) IMPLANT
ELECT CAUTERY BLADE 6.4 (BLADE) ×2 IMPLANT
GLENOID SPHERE STD STRL 36MM (Orthopedic Implant) ×1 IMPLANT
GLOVE BIO SURGEON STRL SZ7.5 (GLOVE) ×8 IMPLANT
GLOVE BIO SURGEON STRL SZ8 (GLOVE) ×8 IMPLANT
GLOVE BIOGEL PI IND STRL 8 (GLOVE) ×1 IMPLANT
GLOVE BIOGEL PI INDICATOR 8 (GLOVE) ×1
GLOVE INDICATOR 8.0 STRL GRN (GLOVE) ×2 IMPLANT
GOWN STRL REUS W/ TWL LRG LVL3 (GOWN DISPOSABLE) ×1 IMPLANT
GOWN STRL REUS W/ TWL XL LVL3 (GOWN DISPOSABLE) ×1 IMPLANT
GOWN STRL REUS W/TWL LRG LVL3 (GOWN DISPOSABLE) ×2
GOWN STRL REUS W/TWL XL LVL3 (GOWN DISPOSABLE) ×2
HOOD PEEL AWAY FLYTE STAYCOOL (MISCELLANEOUS) ×6 IMPLANT
KIT STABILIZATION SHOULDER (MISCELLANEOUS) ×2 IMPLANT
KIT TURNOVER KIT A (KITS) ×2 IMPLANT
MASK FACE SPIDER DISP (MASK) ×2 IMPLANT
MAT ABSORB  FLUID 56X50 GRAY (MISCELLANEOUS) ×1
MAT ABSORB FLUID 56X50 GRAY (MISCELLANEOUS) ×1 IMPLANT
NDL SAFETY ECLIPSE 18X1.5 (NEEDLE) ×1 IMPLANT
NDL SPNL 20GX3.5 QUINCKE YW (NEEDLE) ×1 IMPLANT
NEEDLE HYPO 18GX1.5 SHARP (NEEDLE) ×2
NEEDLE HYPO 22GX1.5 SAFETY (NEEDLE) ×2 IMPLANT
NEEDLE SPNL 20GX3.5 QUINCKE YW (NEEDLE) ×2 IMPLANT
NS IRRIG 500ML POUR BTL (IV SOLUTION) ×2 IMPLANT
PACK ARTHROSCOPY SHOULDER (MISCELLANEOUS) ×2 IMPLANT
PAD WRAPON POLAR SHDR UNIV (MISCELLANEOUS) ×1 IMPLANT
PIN THREADED REVERSE (PIN) ×1 IMPLANT
PULSAVAC PLUS IRRIG FAN TIP (DISPOSABLE) ×2
SCREW BONE LOCKING 4.75X30X3.5 (Screw) ×1 IMPLANT
SCREW CENTRAL 6.5X40 (Screw) ×1 IMPLANT
SCREW LOCKING NS 4.75MMX20MM (Screw) ×1 IMPLANT
SCREW NON-LOCK 4.75MMX15MM (Screw) ×1 IMPLANT
SCREW NON-LOCK 4.75X20X3.5 (Screw) ×1 IMPLANT
SHOULDER HUMERAL BEAR 36M STD (Shoulder) ×2 IMPLANT
SLING ULTRA II M (MISCELLANEOUS) ×1 IMPLANT
SOL .9 NS 3000ML IRR  AL (IV SOLUTION) ×1
SOL .9 NS 3000ML IRR AL (IV SOLUTION) ×1
SOL .9 NS 3000ML IRR UROMATIC (IV SOLUTION) ×1 IMPLANT
SPONGE LAP 18X18 RF (DISPOSABLE) ×2 IMPLANT
STAPLER SKIN PROX 35W (STAPLE) ×2 IMPLANT
STEM HUMERAL STRL 14MMX140MM (Stem) ×1 IMPLANT
SUT ETHIBOND 0 MO6 C/R (SUTURE) ×2 IMPLANT
SUT FIBERWIRE #2 38 BLUE 1/2 (SUTURE) ×6
SUT VIC AB 0 CT1 36 (SUTURE) ×2 IMPLANT
SUT VIC AB 2-0 CT1 27 (SUTURE) ×4
SUT VIC AB 2-0 CT1 TAPERPNT 27 (SUTURE) ×2 IMPLANT
SUTURE FIBERWR #2 38 BLUE 1/2 (SUTURE) ×4 IMPLANT
SYR 10ML LL (SYRINGE) ×2 IMPLANT
SYR 30ML LL (SYRINGE) IMPLANT
TIP FAN IRRIG PULSAVAC PLUS (DISPOSABLE) ×1 IMPLANT
TRAY HUM MINI SHOULDER +0 40D (Shoulder) ×1 IMPLANT
WRAPON POLAR PAD SHDR UNIV (MISCELLANEOUS) ×2

## 2019-06-24 NOTE — H&P (Signed)
Paper H&P to be scanned into permanent record. H&P reviewed and patient re-examined. No changes. 

## 2019-06-24 NOTE — Transfer of Care (Signed)
Immediate Anesthesia Transfer of Care Note  Patient: Christine Higgins  Procedure(s) Performed: REVERSE SHOULDER ARTHROPLASTY (Left Shoulder)  Patient Location: PACU  Anesthesia Type:General  Level of Consciousness: sedated  Airway & Oxygen Therapy: Patient Spontanous Breathing and Patient connected to nasal cannula oxygen  Post-op Assessment: Report given to RN and Post -op Vital signs reviewed and stable  Post vital signs: Reviewed and stable  Last Vitals:  Vitals Value Taken Time  BP    Temp    Pulse    Resp 12 06/24/19 1053  SpO2    Vitals shown include unvalidated device data.  Last Pain:  Vitals:   06/24/19 0720  TempSrc: Tympanic  PainSc: 7       Patients Stated Pain Goal: 0 [phone]  Complications: No apparent anesthesia complications

## 2019-06-24 NOTE — Progress Notes (Signed)
Notified Dr. Roland Rack that patient was notified by primary care md that she has a UTI on Wednesday. Order received for cipro 750 mg orally bid for 2 doses as she was taking at home

## 2019-06-24 NOTE — Progress Notes (Signed)
PHARMACIST - PHYSICIAN ORDER COMMUNICATION  CONCERNING: P&T Medication Policy on Herbal Medications  DESCRIPTION:  This patient's order for:  CoQ tablets and Tumeric tablets  has been noted.  This product(s) is classified as an "herbal" or natural product. Due to a lack of definitive safety studies or FDA approval, nonstandard manufacturing practices, plus the potential risk of unknown drug-drug interactions while on inpatient medications, the Pharmacy and Therapeutics Committee does not permit the use of "herbal" or natural products of this type within [facility].   ACTION TAKEN: The pharmacy department is unable to verify this order at this time and your patient has been informed of this safety policy. Please reevaluate patient's clinical condition at discharge and address if the herbal or natural product(s) should be resumed at that time.

## 2019-06-24 NOTE — Op Note (Signed)
06/24/2019  10:29 AM  Patient:   Christine Higgins  Pre-Op Diagnosis:   Massive irreparable rotator cuff tear with cuff arthropathy, left shoulder.  Post-Op Diagnosis:   Same  Procedure:   Reverse left total shoulder arthroplasty.  Surgeon:   Pascal Lux, MD  Assistant:   Cameron Proud, PA-C; Junie Panning Mecum, PA-S  Anesthesia:   General endotracheal with an interscalene block using Exparel placed preoperatively by the anesthesiologist.  Findings:   As above.  Complications:   None  EBL:    100 cc  Fluids:   1000 cc crystalloid  UOP:   None  TT:   None  Drains:   None  Closure:   Staples  Implants:   All press-fit Biomet Comprehensive system with a #14 micro-humeral stem, a 40 mm humeral tray with a standard insert, and a mini-base plate with a 36 mm glenosphere.  Brief Clinical Note:   The patient is a 63 year old female with a long history of progressively worsening left shoulder pain. Her symptoms have progressed despite medications, activity modification, etc her history examination consistent with a massive rotator cuff tear and early degenerative joint disease, all of which were confirmed by preoperative MRI scanning. The patient presents at this time for a reverse left total shoulder arthroplasty.  Procedure:   The patient underwent placement of an interscalene block using Exparel by the anesthesiologist in the preoperative holding area before being brought into the operating room and lain in the supine position. The patient then underwent general endotracheal intubation and anesthesia before the patient was repositioned in the beach chair position using the beach chair positioner. The left shoulder and upper extremity were prepped with ChloraPrep solution before being draped sterilely. Preoperative antibiotics were administered. A standard anterior approach to the shoulder was made through an approximately 4-5 inch incision. The incision was carried down through the  subcutaneous tissues to expose the deltopectoral fascia. The interval between the deltoid and pectoralis muscles was identified and this plane developed, retracting the cephalic vein laterally with the deltoid muscle. The conjoined tendon was identified. Its lateral margin was dissected and the Kolbel self-retraining retractor inserted. The "three sisters" were identified and cauterized. Bursal tissues were removed to improve visualization. The subscapularis tendon was released from its attachment to the lesser tuberosity 1 cm proximal to its insertion and several tagging sutures placed. The inferior capsule was released with care after identifying and protecting the axillary nerve. The proximal humeral cut was made at approximately 25 of retroversion using the extra-medullary guide.   Attention was redirected to the glenoid. The labrum was debrided circumferentially before the center of the glenoid was marked with electrocautery. The guidewire was drilled into the glenoid neck using the appropriate guide. After verifying its position, it was overreamed with the mini-baseplate reamer to create a flat surface. The permanent mini-baseplate was impacted into place. It was stabilized with a 40 x 6.5 mm central screw and four peripheral screws. Locking screws were placed superiorly and inferiorly while nonlocking screws were placed anteriorly and posteriorly. The permanent 36 mm glenosphere was then impacted into place and its Morse taper locking mechanism verified using manual distraction.  Attention was directed to the humeral side. The humeral canal was reamed sequentially beginning with the end-cutting reamer then progressing from a 4 mm reamer up to a 14 mm reamer. This provided excellent circumferential chatter. The canal was broached beginning with a #11 broach and progressing to a #14 broach. This was left in place and  a trial reduction performed using the standard trial humeral platform. The arm  demonstrated excellent range of motion as the hand could be brought across the chest to the opposite shoulder and brought to the top of the patient's head and to the patient's ear. The shoulder appeared stable throughout this range of motion. The joint was dislocated and the trial components removed. The permanent #14 micro-stem was impacted into place with care taken to maintain the appropriate version. The permanent 40 mm humeral platform with the standard insert was put together on the back table and impacted into place. Again, the [facility] taper locking mechanism was verified using manual distraction. The shoulder was relocated using two finger pressure and again placed through a range of motion with the findings as described above.  The wound was copiously irrigated with sterile saline solution using the jet lavage system before a total of 30 cc of 0.5% Sensorcaine with epinephrine was injected into the pericapsular and peri-incisional tissues to help with postoperative analgesia. The subscapularis tendon was reapproximated using #2 FiberWire interrupted sutures. The deltopectoral interval was closed using #0 Vicryl interrupted sutures before the subcutaneous tissues were closed using 2-0 Vicryl interrupted sutures. The skin was closed using staples. Prior to closing the skin, 1 g of transexemic acid in 10 cc of normal saline was injected intra-articularly to help with postoperative bleeding. A sterile occlusive dressing was applied to the wound before the arm was placed into a shoulder immobilizer with an abduction pillow. A Polar Care system also was applied to the shoulder. The patient was then transferred back to a hospital bed before being awakened, extubated, and returned to the recovery room in satisfactory condition after tolerating the procedure well.

## 2019-06-24 NOTE — Anesthesia Procedure Notes (Signed)
Procedure Name: Intubation Date/Time: 06/24/2019 8:15 AM Performed by: Bernardo Heater, CRNA Pre-anesthesia Checklist: Patient identified, Patient being monitored, Timeout performed, Emergency Drugs available and Suction available Patient Re-evaluated:Patient Re-evaluated prior to induction Oxygen Delivery Method: Circle system utilized Preoxygenation: Pre-oxygenation with 100% oxygen Induction Type: IV induction Laryngoscope Size: Mac and 3 Grade View: Grade I Tube type: Oral Tube size: 7.0 mm Number of attempts: 1 Placement Confirmation: ETT inserted through vocal cords under direct vision,  positive ETCO2 and breath sounds checked- equal and bilateral Secured at: 21 cm Tube secured with: Tape Dental Injury: Teeth and Oropharynx as per pre-operative assessment

## 2019-06-24 NOTE — Evaluation (Signed)
Physical Therapy Evaluation Patient Details Name: Christine Higgins MRN: [id number] DOB: April 07, 1956 Today's Date: 06/24/2019   History of Present Illness  admitted for acute hospitalization s/p L reverse TSA (10/29/2), NWB, immobilized.  Clinical Impression  Upon evaluation, patient alert and oriented; follows commands, eager for participation with session.  L UE immobilized and well-positioned in abduction sling/brace.  Denies pain, as block still in place at this time.  R UE, bilat LE strength and ROM otherwise symmetrical and WFL.  Able to complete bed mobility with close sup; sit/stand, basic transfers and gait (50') with SPC, cga/min assist.  Demonstrates reciprocal stepping pattern with slow, but fairly steady, gait performance. Mild sway, but no overt buckling or LOB. Min cuing for awareness of environment and L UE protection with narrowed spaces/doorways. Would benefit from skilled PT to address above deficits and promote optimal return to PLOF; recommend follow up PT services per surgeon's recommendations/plans.     Follow Up Recommendations Follow surgeon's recommendation for DC plan and follow-up therapies    Equipment Recommendations       Recommendations for Other Services       Precautions / Restrictions Precautions Precautions: Fall Restrictions Weight Bearing Restrictions: Yes LUE Weight Bearing: Non weight bearing      Mobility  Bed Mobility Overal bed mobility: Needs Assistance Bed Mobility: Supine to Sit;Sit to Supine     Supine to sit: Supervision Sit to supine: Supervision   General bed mobility comments: cuing for protection of L UE with movement transition (to avoid bumping on bed)  Transfers Overall transfer level: Needs assistance Equipment used: Straight cane Transfers: Sit to/from Stand Sit to Stand: Min assist;Min guard         General transfer comment: requires use of R UE to assist with lift off  Ambulation/Gait Ambulation/Gait  assistance: Min guard;Min assist Gait Distance (Feet): 50 Feet Assistive device: Straight cane       General Gait Details: reciprocal stepping pattern with slow, but fairly steady, gait performance. Mild sway, but no overt buckling or LOB. Min cuing for awareness of environment and L UE protection with narrowed spaces/doorways.  Stairs            Wheelchair Mobility    Modified Rankin (Stroke Patients Only)       Balance Overall balance assessment: Needs assistance Sitting-balance support: No upper extremity supported;Feet supported Sitting balance-Leahy Scale: Good     Standing balance support: Single extremity supported Standing balance-Leahy Scale: Fair                               Pertinent Vitals/Pain Pain Assessment: No/denies pain    Home Living Family/patient expects to be discharged to:: Private residence Living Arrangements: Alone   Type of Home: House Home Access: Stairs to enter Entrance Stairs-Rails: Right Entrance Stairs-Number of Steps: 5 Home Layout: One level Home Equipment: Cane - single point      Prior Function Level of Independence: Independent with assistive device(s)         Comments: Mod indep with SPC for ADLs, household and community mobilization; endorses 2 falls within previous year; + driving.     Hand Dominance   Dominant Hand: Right    Extremity/Trunk Assessment   Upper Extremity Assessment Upper Extremity Assessment: (R UE grossly WFL; L UE immobilized in abduct sling, fully insensate (block intact))    Lower Extremity Assessment Lower Extremity Assessment: Overall WFL for tasks assessed(grossly 4-/5 throughout)  Communication   Communication: No difficulties  Cognition Arousal/Alertness: Awake/alert Behavior During Therapy: WFL for tasks assessed/performed Overall Cognitive Status: Within Functional Limits for tasks assessed                                        General  Comments      Exercises     Assessment/Plan    PT Assessment Patient needs continued PT services  PT Problem List Decreased strength;Decreased range of motion;Decreased activity tolerance;Decreased balance;Decreased mobility;Decreased coordination;Decreased cognition;Decreased knowledge of use of DME;Decreased safety awareness;Decreased knowledge of precautions;Impaired sensation;Decreased skin integrity;Pain       PT Treatment Interventions DME instruction;Gait training;Stair training;Functional mobility training;Therapeutic activities;Therapeutic exercise;Balance training;Patient/family education    PT Goals (Current goals can be found in the Care Plan section)  Acute Rehab PT Goals Patient Stated Goal: patient expressing desire for rehab PT Goal Formulation: With patient/family Time For Goal Achievement: 07/08/19 Potential to Achieve Goals: Good    Frequency 7X/week   Barriers to discharge        Co-evaluation               AM-PAC PT "6 Clicks" Mobility  Outcome Measure Help needed turning from your back to your side while in a flat bed without using bedrails?: A Little Help needed moving from lying on your back to sitting on the side of a flat bed without using bedrails?: A Little Help needed moving to and from a bed to a chair (including a wheelchair)?: A Little Help needed standing up from a chair using your arms (e.g., wheelchair or bedside chair)?: A Little Help needed to walk in hospital room?: A Little Help needed climbing 3-5 steps with a railing? : A Lot 6 Click Score: 17    End of Session Equipment Utilized During Treatment: Gait belt Activity Tolerance: Patient tolerated treatment well Patient left: in bed;with call bell/phone within reach;with bed alarm set;with family/visitor present;with nursing/sitter in room Nurse Communication: Mobility status PT Visit Diagnosis: Muscle weakness (generalized) (M62.81);Difficulty in walking, not elsewhere  classified (R26.2);Pain Pain - Right/Left: Left Pain - part of body: Shoulder    Time: [id number] PT Time Calculation (min) (ACUTE ONLY): 18 min   Charges:   PT Evaluation $PT Eval Moderate Complexity: 1 Mod         Estela Vinal H. Owens Shark, PT, DPT, NCS 06/24/19, 5:08 PM [phone]

## 2019-06-24 NOTE — Anesthesia Preprocedure Evaluation (Addendum)
Anesthesia Evaluation  Patient identified by MRN, date of birth, ID band Patient awake    Reviewed: Allergy & Precautions, NPO status , Patient's Chart, lab work & pertinent test results, reviewed documented beta blocker date and time   History of Anesthesia Complications Negative for: history of anesthetic complications  Airway Mallampati: III       Dental   Pulmonary asthma , neg sleep apnea, neg COPD, Not current smoker, former smoker,           Cardiovascular hypertension, Pt. on medications and Pt. on home beta blockers (-) Past MI and (-) CHF (-) dysrhythmias (-) Valvular Problems/Murmurs     Neuro/Psych neg Seizures Anxiety Depression    GI/Hepatic Neg liver ROS, GERD  Medicated,  Endo/Other  diabetes, Type 2, Oral Hypoglycemic AgentsHyperthyroidism   Renal/GU negative Renal ROS     Musculoskeletal   Abdominal   Peds  Hematology   Anesthesia Other Findings   Reproductive/Obstetrics                            Anesthesia Physical Anesthesia Plan  ASA: III  Anesthesia Plan: General   Post-op Pain Management: GA combined w/ Regional for post-op pain   Induction: Intravenous  PONV Risk Score and Plan: 3 and Dexamethasone, Ondansetron and Midazolam  Airway Management Planned: Oral ETT  Additional Equipment:   Intra-op Plan:   Post-operative Plan:   Informed Consent: I have reviewed the patients History and Physical, chart, labs and discussed the procedure including the risks, benefits and alternatives for the proposed anesthesia with the patient or authorized representative who has indicated his/her understanding and acceptance.       Plan Discussed with:   Anesthesia Plan Comments:         Anesthesia Quick Evaluation

## 2019-06-24 NOTE — Anesthesia Post-op Follow-up Note (Signed)
Anesthesia QCDR form completed.        

## 2019-06-24 NOTE — Anesthesia Postprocedure Evaluation (Signed)
Anesthesia Post Note  Patient: Christine Higgins  Procedure(s) Performed: REVERSE SHOULDER ARTHROPLASTY (Left Shoulder)  Patient location during evaluation: PACU Anesthesia Type: General Level of consciousness: awake and alert Pain management: pain level controlled Vital Signs Assessment: post-procedure vital signs reviewed and stable Respiratory status: spontaneous breathing and respiratory function stable Cardiovascular status: stable Anesthetic complications: no     Last Vitals:  Vitals:   06/24/19 1115 06/24/19 1124  BP:  107/71  Pulse: 77 73  Resp: 16 18  Temp:    SpO2: 100% 99%    Last Pain:  Vitals:   06/24/19 1115  TempSrc:   PainSc: 0-No pain                 KEPHART,WILLIAM K

## 2019-06-24 NOTE — Anesthesia Procedure Notes (Signed)
Anesthesia Regional Block: Interscalene brachial plexus block   Pre-Anesthetic Checklist: ,, timeout performed, Correct Patient, Correct Site, Correct Laterality, Correct Procedure, Correct Position, site marked, Risks and benefits discussed,  Surgical consent,  Pre-op evaluation,  At surgeon's request and post-op pain management  Laterality: Left  Prep: chloraprep       Needles:  Injection technique: Single-shot  Needle Type: Echogenic Needle     Needle Length: 9cm  Needle Gauge: 21     Additional Needles:   Procedures:, nerve stimulator,,, ultrasound used (permanent image in chart),,,,   Nerve Stimulator or Paresthesia:  Response: biceps flexion, 0.8 mA,   Additional Responses:   Narrative:  Start time: 06/24/2019 7:39 AM End time: 06/24/2019 7:58 AM Injection made incrementally with aspirations every 5 mL.  Performed by: Personally   Additional Notes: Functioning IV was confirmed and monitors were applied.  A 88mm 22ga Stimuplex needle was used. Sterile prep and drape,hand hygiene and sterile gloves were used.  Negative aspiration and negative test dose prior to incremental administration of local anesthetic. The patient tolerated the procedure well.

## 2019-06-25 ENCOUNTER — Inpatient Hospital Stay: Payer: Medicare HMO

## 2019-06-25 ENCOUNTER — Encounter: Payer: Self-pay | Admitting: Surgery

## 2019-06-25 LAB — CBC WITH DIFFERENTIAL/PLATELET
Abs Immature Granulocytes: 0.01 10*3/uL (ref 0.00–0.07)
Basophils Absolute: 0 10*3/uL (ref 0.0–0.1)
Basophils Relative: 1 %
Eosinophils Absolute: 0.2 10*3/uL (ref 0.0–0.5)
Eosinophils Relative: 5 %
HCT: 30.6 % — ABNORMAL LOW (ref 36.0–46.0)
Hemoglobin: 10 g/dL — ABNORMAL LOW (ref 12.0–15.0)
Immature Granulocytes: 0 %
Lymphocytes Relative: 18 %
Lymphs Abs: 0.9 10*3/uL (ref 0.7–4.0)
MCH: 26.9 pg (ref 26.0–34.0)
MCHC: 32.7 g/dL (ref 30.0–36.0)
MCV: 82.3 fL (ref 80.0–100.0)
Monocytes Absolute: 0.6 10*3/uL (ref 0.1–1.0)
Monocytes Relative: 12 %
Neutro Abs: 3.1 10*3/uL (ref 1.7–7.7)
Neutrophils Relative %: 64 %
Platelets: 153 10*3/uL (ref 150–400)
RBC: 3.72 MIL/uL — ABNORMAL LOW (ref 3.87–5.11)
RDW: 14 % (ref 11.5–15.5)
WBC: 4.8 10*3/uL (ref 4.0–10.5)
nRBC: 0 % (ref 0.0–0.2)

## 2019-06-25 LAB — BASIC METABOLIC PANEL
Anion gap: 11 (ref 5–15)
BUN: 11 mg/dL (ref 8–23)
CO2: 25 mmol/L (ref 22–32)
Calcium: 8.2 mg/dL — ABNORMAL LOW (ref 8.9–10.3)
Chloride: 96 mmol/L — ABNORMAL LOW (ref 98–111)
Creatinine, Ser: 0.8 mg/dL (ref 0.44–1.00)
GFR calc Af Amer: >60 mL/min (ref >60)
GFR calc non Af Amer: >60 mL/min (ref >60)
Glucose, Bld: 112 mg/dL — ABNORMAL HIGH (ref 70–99)
Potassium: 3.3 mmol/L — ABNORMAL LOW (ref 3.5–5.1)
Sodium: 132 mmol/L — ABNORMAL LOW (ref 135–145)

## 2019-06-25 LAB — SURGICAL PATHOLOGY

## 2019-06-25 MED ORDER — POTASSIUM CHLORIDE 20 MEQ PO PACK
20.0000 meq | PACK | ORAL | Status: AC
  Administered 2019-06-25: 20 meq via ORAL
  Filled 2019-06-25: qty 1

## 2019-06-25 MED ORDER — POLYETHYLENE GLYCOL 3350 17 G PO PACK
17.0000 g | PACK | Freq: Every day | ORAL | Status: DC | PRN
  Administered 2019-06-25: 17 g via ORAL
  Filled 2019-06-25: qty 1

## 2019-06-25 NOTE — Progress Notes (Signed)
Physical Therapy Treatment Patient Details Name: Christine Higgins MRN: [id number] DOB: 1955-11-11 Today's Date: 06/25/2019    History of Present Illness admitted for acute hospitalization s/p L reverse TSA (10/29/2), NWB, immobilized.    PT Comments    Pt ready on 4th attempt.  To edge of bed with HOB raised and rail with verbal cues to slow down and wait for equipment to be moved and SCD's removed.  Stood and was able to walk to bathroom with SPC with min assist.  Began to void while sitting which may explain some of her impulsiveness getting out of bed "I was trying to wait as long as I could."  Education provided.  Stood for self care.  Pt remained at 90 degrees flexed posture during self care.  Pt admitted later in session she felt dizzy after standing up in bathroom.  Education again provided regarding safety and not walking while dizzy/notifying staff.  Walked to [employer] for stair training.  Seated rest on mat before and after stairs.  Up/dwon 4 steps with R rial and min a/guard +2 for safety. Stairs remain high fall risk due to limited mobility and weakness/pain in BLE arthritic joints.  Walked back to room with SPC and min guard x 2.  Pt demonstrated decreased safety throughout session with hand placements, awareness and leaving SPC off to the side in bathroom and on approach to bed.  Discussed discharge plan.  Pt plans to return home alone with occasional check-ins by family.  Pt voiced feeling nervous about discharge home and I share concerns regarding her safety with independent mobility at home.  She is to talk with family today regarding their ability to provide 24 hour assist upon discharge but given pt's limited mobility and increased fall risk, will initiate SNF procedure as she would benefit upon continued skilled PT interventions for safety and a successful discharge home.   Follow Up Recommendations  Follow surgeon's recommendation for DC plan and follow-up therapies;SNF      Equipment Recommendations  3in1 (PT)    Recommendations for Other Services       Precautions / Restrictions Precautions Precautions: Fall;Shoulder Type of Shoulder Precautions: NWBing LUE, no shoulder ROM Shoulder Interventions: Shoulder sling/immobilizer;At all times;Off for dressing/bathing/exercises Restrictions LUE Weight Bearing: Non weight bearing    Mobility  Bed Mobility Overal bed mobility: Needs Assistance Bed Mobility: Supine to Sit;Sit to Supine     Supine to sit: Supervision Sit to supine: Supervision   General bed mobility comments: heavy use of rail and HOB raised  Transfers Overall transfer level: Needs assistance Equipment used: Standard walker Transfers: Sit to/from Stand Sit to Stand: Min guard         General transfer comment: requires use of R UE to assist with lift off  Ambulation/Gait Ambulation/Gait assistance: Min assist Gait Distance (Feet): 40 Feet Assistive device: Straight cane Gait Pattern/deviations: Step-through pattern;Decreased step length - right;Decreased step length - left Gait velocity: decreased       Stairs Stairs: Yes Stairs assistance: Min assist;+2 physical assistance;+2 safety/equipment Stair Management: One rail Right Number of Stairs: 4 General stair comments: able to do with +2 assist and heavy use of rail.  Arthritic joints limit safety   Wheelchair Mobility    Modified Rankin (Stroke Patients Only)       Balance Overall balance assessment: Needs assistance Sitting-balance support: No upper extremity supported;Feet supported Sitting balance-Leahy Scale: Good     Standing balance support: Single extremity supported Standing balance-Leahy Scale: Fair  Cognition Arousal/Alertness: Awake/alert Behavior During Therapy: WFL for tasks assessed/performed;Impulsive Overall Cognitive Status: Within Functional Limits for tasks assessed                                         Exercises Other Exercises Other Exercises: to commode to void    General Comments        Pertinent Vitals/Pain Pain Assessment: Faces Faces Pain Scale: Hurts even more Pain Location: some increased shoulder pain and Knees/hips due to arthritis Pain Descriptors / Indicators: Aching;Sore Pain Intervention(s): Limited activity within patient's tolerance;Monitored during session;Repositioned;Ice applied    Home Living                      Prior Function            PT Goals (current goals can now be found in the care plan section) Progress towards PT goals: Progressing toward goals    Frequency    7X/week      PT Plan Discharge plan needs to be updated    Co-evaluation              AM-PAC PT "6 Clicks" Mobility   Outcome Measure  Help needed turning from your back to your side while in a flat bed without using bedrails?: A Little Help needed moving from lying on your back to sitting on the side of a flat bed without using bedrails?: A Little Help needed moving to and from a bed to a chair (including a wheelchair)?: A Little Help needed standing up from a chair using your arms (e.g., wheelchair or bedside chair)?: A Little Help needed to walk in hospital room?: A Little Help needed climbing 3-5 steps with a railing? : A Lot 6 Click Score: 17    End of Session Equipment Utilized During Treatment: Gait belt Activity Tolerance: Patient tolerated treatment well Patient left: in bed;with call bell/phone within reach;with bed alarm set   Pain - Right/Left: Left Pain - part of body: Shoulder     Time: 1431-1455 PT Time Calculation (min) (ACUTE ONLY): 24 min  Charges:  $Gait Training: 8-22 mins $Therapeutic Activity: 8-22 mins                     Chesley Noon, PTA 06/25/19, 3:06 PM

## 2019-06-25 NOTE — TOC Initial Note (Addendum)
Transition of Care [facility]) - Initial/Assessment Note    Patient Details  Name: Christine Higgins MRN: [id number] Date of Birth: Feb 19, 1956  Transition [facility]) CM/SW Contact:    Christine Higgins, Christine Higgins Phone Number: [phone]  06/25/2019, 4:49 PM  Clinical Narrative: Per PT patient requested SNF and prefers Christine Higgins. PT is recommending SNF. Clinical Education officer, museum (CSW) met with patient alone at bedside this afternoon to discuss D/C plan. Patient was alert and oriented X4 and was laying in the bed. CSW introduced self and explained role of CSW department. Per patient she lives in [place] the [place] alone. Patient reported that she has a cane and a walker at home. Per patient her daughter Christine Higgins is a Therapist, sports and will check on her when she is not at work. Patient reported that she will consider SNF however she prefers to go home with home health and requested an aide. Patient does not have a home health agency preference. Per [facility]gins [facility] representative they can accept patient. Patient is agreeable to [facility] for home health and is agreeable to SNF search in [place]. FL2 complete and faxed out. CSW was provided CMS home health list and CMS SNF list.   CSW presented SNF bed offers to patient and she chose [facility]. Per [facility] admissions coordinator at [facility] Higgins [facility] will [facility]. [facility] does not accept weekend admissions. Per Christine Higgins she will accept patient's negative covid test on 10/26. CSW faxed H&P to Christine Higgins today.   Plan is for patient to progress with PT and D/C home with [facility] health PT, OT and aide. If patient continues to require SNF then she can D/C to [place] SNF authorization. Patient and her daughter Christine Higgins are aware of above. CSW will continue to follow and assist as needed.              Expected Discharge Plan: Skilled Nursing Facility Barriers to Discharge:  Continued Medical Work up   Patient Goals and CMS Choice Patient states their goals for this hospitalization and ongoing recovery are:: Pain control. CMS Medicare.gov Compare Post Acute Care list provided to:: Patient Choice offered to / list presented to : Patient  Expected Discharge Plan and Services Expected Discharge Plan: [place] In-house Referral: Clinical Social Work   Post Acute Care Choice: Home Health, [place] Living arrangements for the past 2 months: Single Family Home                 DME Arranged: N/A         HH Arranged: PT, OT, Nurse's Aide [place] Agency: [facility] [employer] [facility] [employer] Date [place]: 06/24/19 Time [place]: [place] Representative spoke with at [place]: Christine Higgins Arrangements/Services Living arrangements for the past 2 months: [place] with:: Self Patient language and need for interpreter reviewed:: No Do you feel safe going back to the place where you live?: Yes      Need for Family Participation in Patient Care: No (Comment) Care giver support system in place?: No (comment) Current home services: DME(Patient has a cane and a walker at home.) Criminal Activity/Legal Involvement Pertinent to Current Situation/Hospitalization: No - Comment as needed  Activities of Daily [place] Devices/Equipment: Cane (specify quad or straight), Walker (specify type) ADL Screening (condition at time of admission) Patient's cognitive ability adequate to safely complete daily activities?:  Yes Is the patient deaf or have difficulty hearing?: No Does the patient have difficulty seeing, even when wearing glasses/contacts?: No Does the patient have difficulty concentrating, remembering, or making decisions?: No Patient able to express need for assistance with ADLs?: Yes Does the patient have difficulty dressing or bathing?: No Independently performs ADLs?:  Yes (appropriate for developmental age) Does the patient have difficulty walking or climbing stairs?: Yes Weakness of Legs: Both Weakness of Arms/Hands: Left  Permission Sought/Granted Permission sought to share information with : Chartered certified accountant granted to share information with : Yes, Verbal Permission Granted              Emotional Assessment Appearance:: Appears stated age Attitude/Demeanor/Rapport: Engaged Affect (typically observed): Pleasant, Calm Orientation: : Oriented to Self, Oriented to Place, Oriented to  Time, Oriented to Situation Alcohol / Substance Use: Not Applicable Psych Involvement: No (comment)  Admission diagnosis:  [place].  ROTATOR CUFF TENDINITIS, LEFT. Patient Active Problem List   Diagnosis Date Noted  . Status post reverse arthroplasty of shoulder, left 06/24/2019  . History of lumbar laminectomy for spinal cord decompression (L3-L4, 2015) 02/25/2019  . Lumbar radiculopathy 02/25/2019  . Tachycardia 08/18/2017  . Status post total left knee replacement 08/11/2017  . Chronic pain syndrome 07/30/2017  . Fibromyalgia 07/30/2017  . Pelvic pain in female 06/24/2017  . Dysuria 06/24/2017  . Right carpal tunnel syndrome 03/27/2017  . HTN, goal below 140/90 03/05/2017  . Graves' orbitopathy 02/17/2017  . Primary osteoarthritis of right knee 10/18/2016  . Primary osteoarthritis of left knee 10/18/2016  . Bilateral arm weakness 07/16/2016  . Depression, major, in remission ([place]) 12/21/2015  . Back pain at L4-L5 level 10/30/2015  . DDD (degenerative disc disease), lumbosacral 10/30/2015  . Status post total replacement of right hip 08/21/2015  . Leg pain 07/28/2015  . Primary osteoarthritis of both knees 07/28/2015  . Health care maintenance 06/21/2015  . Impingement syndrome of shoulder, left 10/20/2014  . Severe obesity (BMI 35.0-35.9 with comorbidity) ([place]) 08/09/2014  .  Atherosclerosis of abdominal aorta ([place]) 07/29/2014  . DM II (diabetes mellitus, type II), controlled ([place]) 04/19/2014  . Hemorrhoids 03/14/2014  . Osteoporosis 02/03/2014  . Migraines 02/03/2014  . Hyperlipidemia, unspecified 02/03/2014  . GERD (gastroesophageal reflux disease) 02/03/2014   PCP:  Kirk Ruths, MD Pharmacy:   [id number] [place]Phone: [phone]: [phone]    Social Determinants of Health (SDOH) Interventions    Readmission Risk Interventions No flowsheet data found.

## 2019-06-25 NOTE — Plan of Care (Signed)
  Problem: Education: Goal: Knowledge of the prescribed therapeutic regimen will improve Outcome: Progressing Goal: Understanding of activity limitations/precautions following surgery will improve Outcome: Progressing Goal: Individualized Educational Video(s) Outcome: Progressing   Problem: Activity: Goal: Ability to tolerate increased activity will improve Outcome: Progressing   Problem: Pain Management: Goal: Pain level will decrease with appropriate interventions Outcome: Progressing   Problem: Education: Goal: Knowledge of General Education information will improve Description: Including pain rating scale, medication(s)/side effects and non-pharmacologic comfort measures Outcome: Progressing   Problem: Health Behavior/Discharge Planning: Goal: Ability to manage health-related needs will improve Outcome: Progressing   Problem: Clinical Measurements: Goal: Ability to maintain clinical measurements within normal limits will improve Outcome: Progressing Goal: Will remain free from infection Outcome: Progressing Goal: Diagnostic test results will improve Outcome: Progressing Goal: Respiratory complications will improve Outcome: Progressing Goal: Cardiovascular complication will be avoided Outcome: Progressing   Problem: Activity: Goal: Risk for activity intolerance will decrease Outcome: Progressing   Problem: Nutrition: Goal: Adequate nutrition will be maintained Outcome: Progressing   Problem: Coping: Goal: Level of anxiety will decrease Outcome: Progressing   Problem: Elimination: Goal: Will not experience complications related to bowel motility Outcome: Progressing Goal: Will not experience complications related to urinary retention Outcome: Progressing   Problem: Pain Managment: Goal: General experience of comfort will improve Outcome: Progressing   Problem: Safety: Goal: Ability to remain free from injury will improve Outcome: Progressing   Problem:  Skin Integrity: Goal: Risk for impaired skin integrity will decrease Outcome: Progressing   

## 2019-06-25 NOTE — Progress Notes (Signed)
  Subjective: 1 Day Post-Op Procedure(s) (LRB): REVERSE SHOULDER ARTHROPLASTY (Left) Patient reports pain as mild.   Patient is well, and has had no acute complaints or problems PT and care management to assist with discharge planning. Negative for chest pain and shortness of breath Fever: no Gastrointestinal:Negative for nausea and vomiting  Objective: Vital signs in last 24 hours: Temp:  [97.5 F (36.4 C)-98.2 F (36.8 C)] 98.2 F (36.8 C) (10/30 0434) Pulse Rate:  [64-78] 71 (10/30 0434) Resp:  [12-25] 19 (10/30 0434) BP: (93-132)/(58-86) 112/64 (10/30 0434) SpO2:  [93 %-100 %] 93 % (10/30 0434)  Intake/Output from previous day:  Intake/Output Summary (Last 24 hours) at 06/25/2019 0724 Last data filed at 06/25/2019 0036 Gross per 24 hour  Intake 1807.25 ml  Output 100 ml  Net 1707.25 ml    Intake/Output this shift: No intake/output data recorded.  Labs: Recent Labs    06/25/19 0553  HGB 10.0*   Recent Labs    06/25/19 0553  WBC 4.8  RBC 3.72*  HCT 30.6*  PLT 153   Recent Labs    06/25/19 0553  NA 132*  K 3.3*  CL 96*  CO2 25  BUN 11  CREATININE 0.80  GLUCOSE 112*  CALCIUM 8.2*   No results for input(s): LABPT, INR in the last 72 hours.   EXAM General - Patient is Alert, Appropriate and Oriented Extremity - ABD soft Incision: dressing C/D/I  Decreased sensation to light touch to the left arm this morning. Dressing/Incision - clean, dry, no drainage Motor Function - intact, moving foot and toes well on exam.   Past Medical History:  Diagnosis Date  . Anxiety   . Asthma   . Breast mass    LEFT x 3 months per pt and palpated by physician  . Cervical disc disease   . Chronic pain syndrome   . Degenerative disc disease, lumbar   . Depression   . Fibromyalgia   . Fibromyalgia   . GERD (gastroesophageal reflux disease)   . Glaucoma   . Graves disease   . Hemorrhoids   . Hyperlipidemia   . Hypertension   . Thyroid disease      Assessment/Plan: 1 Day Post-Op Procedure(s) (LRB): REVERSE SHOULDER ARTHROPLASTY (Left) Active Problems:   Status post reverse arthroplasty of shoulder, left  Estimated body mass index is 36.26 kg/m as calculated from the following:   Height as of this encounter: 5\' 11"  (1.803 m).   Weight as of this encounter: 117.9 kg. Advance diet Up with therapy D/C IV fluids when tolerating po intake.  Labs reviewed this morning. Block from surgery still in effect to the left arm. Up with therapy today. Will monitor today, plan for possible d/c home this afternoon vs. Tomorrow.  DVT Prophylaxis - Lovenox, Foot Pumps and TED hose Non-weightbearing to the left arm.  Raquel James, PA-C [facility] Orthopaedic Surgery 06/25/2019, 7:24 AM

## 2019-06-25 NOTE — NC FL2 (Signed)
[place] LEVEL OF CARE SCREENING TOOL     IDENTIFICATION  Patient Name: Christine Higgins Birthdate: Sep 16, 1955 Sex: female Admission Date (Current Location): 06/24/2019  [place] and [place] Number:  Engineering geologist and Address:  [facility], [address], [place], [place] [address]      Provider Number: [id number]  Attending Physician Name and Address:  Corky Mull, MD  Relative Name and Phone Number:       Current Level of Care: Hospital Recommended Level of Care: [place] Prior Approval Number:    Date Approved/Denied:   PASRR Number: [id number]  Discharge Plan: SNF    Current Diagnoses: Patient Active Problem List   Diagnosis Date Noted  . Status post reverse arthroplasty of shoulder, left 06/24/2019  . History of lumbar laminectomy for spinal cord decompression (L3-L4, 2015) 02/25/2019  . Lumbar radiculopathy 02/25/2019  . Tachycardia 08/18/2017  . Status post total left knee replacement 08/11/2017  . Chronic pain syndrome 07/30/2017  . Fibromyalgia 07/30/2017  . Pelvic pain in female 06/24/2017  . Dysuria 06/24/2017  . Right carpal tunnel syndrome 03/27/2017  . HTN, goal below 140/90 03/05/2017  . Graves' orbitopathy 02/17/2017  . Primary osteoarthritis of right knee 10/18/2016  . Primary osteoarthritis of left knee 10/18/2016  . Bilateral arm weakness 07/16/2016  . Depression, major, in remission ([place]) 12/21/2015  . Back pain at L4-L5 level 10/30/2015  . DDD (degenerative disc disease), lumbosacral 10/30/2015  . Status post total replacement of right hip 08/21/2015  . Leg pain 07/28/2015  . Primary osteoarthritis of both knees 07/28/2015  . Health care maintenance 06/21/2015  . Impingement syndrome of shoulder, left 10/20/2014  . Severe obesity (BMI 35.0-35.9 with comorbidity) ([place]) 08/09/2014  . Atherosclerosis of abdominal aorta ([place]) 07/29/2014  . DM II (diabetes mellitus, type II),  controlled ([place]) 04/19/2014  . Hemorrhoids 03/14/2014  . Osteoporosis 02/03/2014  . Migraines 02/03/2014  . Hyperlipidemia, unspecified 02/03/2014  . GERD (gastroesophageal reflux disease) 02/03/2014    Orientation RESPIRATION BLADDER Height & Weight     Self, Time, Situation, Place  Normal Continent Weight: 260 lb (117.9 kg) Height:  5\' 11"  (180.3 cm)  BEHAVIORAL SYMPTOMS/MOOD NEUROLOGICAL BOWEL NUTRITION STATUS      Continent Diet(Diet: Heart Healthy/ Card Modified.)  AMBULATORY STATUS COMMUNICATION OF NEEDS Skin   Extensive Assist Verbally Surgical wounds(Incision: Left Shoulder)                       Personal Care Assistance Level of Assistance  Bathing, Feeding, Dressing Bathing Assistance: Limited assistance Feeding assistance: Independent Dressing Assistance: Limited assistance     Functional Limitations Info  Sight, Hearing, Speech Sight Info: Adequate Hearing Info: Adequate Speech Info: Adequate    SPECIAL CARE FACTORS FREQUENCY  PT (By licensed PT), OT (By licensed OT)     PT Frequency: 5 OT Frequency: 5            Contractures      Additional Factors Info  Code Status, Allergies Code Status Info: Full Code. Allergies Info: Cephalexin, Ibuprofen, Shellfish Allergy, Aspirin           Current Medications (06/25/2019):  This is the current hospital active medication list Current Facility-Administered Medications  Medication Dose Route Frequency Provider Last Rate Last Dose  . 0.9 %  sodium chloride infusion   Intravenous Continuous Poggi, Marshall Cork, MD 75 mL/hr at 06/24/19 2300    . acetaminophen (TYLENOL) tablet 1,000 mg  1,000 mg Oral Q6H Poggi, Marshall Cork, MD   1,000 mg at 06/24/19 2323  . acetaminophen (TYLENOL) tablet 325-650 mg  325-650 mg Oral Q6H PRN Poggi, Marshall Cork, MD      . acidophilus (RISAQUAD) capsule   Oral Daily Poggi, Marshall Cork, MD      . atorvastatin (LIPITOR) tablet 10 mg  10 mg Oral QPC supper Poggi, Marshall Cork, MD   10 mg at 06/24/19 1826   . bisacodyl (DULCOLAX) suppository 10 mg  10 mg Rectal Daily PRN Poggi, Marshall Cork, MD      . calcium carbonate (OS-CAL - dosed in mg of elemental calcium) tablet 500 mg of elemental calcium  1 tablet Oral QPM Poggi, Marshall Cork, MD      . cholecalciferol (VITAMIN D3) tablet 2,000 Units  2,000 Units Oral QPM Poggi, Marshall Cork, MD      . ciprofloxacin (CIPRO) tablet 750 mg  750 mg Oral BID Poggi, Marshall Cork, MD   750 mg at 06/24/19 2014  . clobetasol cream (TEMOVATE) [id number] % 1 application  1 application Topical BID PRN Poggi, Marshall Cork, MD      . clonazePAM [place]) tablet 1 mg  1 mg Oral QHS PRN Poggi, Marshall Cork, MD   1 mg at 06/24/19 2323  . clotrimazole (LOTRIMIN) 1 % cream   Topical BID PRN Poggi, Marshall Cork, MD      . cyclobenzaprine (FLEXERIL) tablet 5 mg  5 mg Oral BID PRN Poggi, Marshall Cork, MD      . diphenhydrAMINE (BENADRYL) 12.5 MG/5ML elixir 12.5-25 mg  12.5-25 mg Oral Q4H PRN Poggi, Marshall Cork, MD      . diphenhydrAMINE (BENADRYL) capsule 50 mg  50 mg Oral BID PRN Poggi, Marshall Cork, MD      . docusate sodium (COLACE) capsule 100 mg  100 mg Oral BID Poggi, Marshall Cork, MD   100 mg at 06/24/19 2013  . DULoxetine (CYMBALTA) DR capsule 90 mg  90 mg Oral QPM Poggi, Marshall Cork, MD   90 mg at 06/24/19 1846  . enoxaparin (LOVENOX) injection 40 mg  40 mg Subcutaneous Q24H Poggi, Marshall Cork, MD   40 mg at 06/25/19 0807  . gabapentin (NEURONTIN) capsule 600 mg  600 mg Oral TID Corky Mull, MD   600 mg at 06/24/19 2013  . hydrALAZINE (APRESOLINE) tablet 25 mg  25 mg Oral TID PRN Poggi, Marshall Cork, MD      . losartan (COZAAR) tablet 100 mg  100 mg Oral Daily Nazari, Walid A, RPH       And  . hydrochlorothiazide (HYDRODIURIL) tablet 25 mg  25 mg Oral Daily Nazari, Walid A, RPH      . HYDROmorphone (DILAUDID) injection 0.25-0.5 mg  0.25-0.5 mg Intravenous Q2H PRN Poggi, Marshall Cork, MD      . hydrOXYzine (ATARAX/VISTARIL) tablet 30 mg  30 mg Oral QHS Poggi, Marshall Cork, MD   30 mg at 06/24/19 2014  . ketorolac (TORADOL) 15 MG/ML injection 15 mg  15 mg  Intravenous Q6H Poggi, Marshall Cork, MD   15 mg at 06/25/19 [id number]  . linaclotide (LINZESS) capsule 290 mcg  290 mcg Oral Daily PRN Corky Mull, MD   290 mcg at 06/25/19 0807  . magnesium hydroxide (MILK OF MAGNESIA) suspension 30 mL  30 mL Oral Daily PRN Poggi, Marshall Cork, MD      . methimazole (TAPAZOLE) tablet 10 mg  10 mg Oral Daily Poggi, Marshall Cork, MD      .  metoCLOPramide (REGLAN) tablet 5-10 mg  5-10 mg Oral Q8H PRN Poggi, Marshall Cork, MD       Or  . metoCLOPramide (REGLAN) injection 5-10 mg  5-10 mg Intravenous Q8H PRN Poggi, Marshall Cork, MD      . mometasone-formoterol [facility]) 200-5 MCG/ACT inhaler 2 puff  2 puff Inhalation BID Poggi, Marshall Cork, MD   2 puff at 06/25/19 0808  . ondansetron (ZOFRAN) tablet 4 mg  4 mg Oral Q6H PRN Poggi, Marshall Cork, MD       Or  . ondansetron (ZOFRAN) injection 4 mg  4 mg Intravenous Q6H PRN Poggi, Marshall Cork, MD      . oxyCODONE (Oxy IR/ROXICODONE) immediate release tablet 5-10 mg  5-10 mg Oral Q4H PRN Poggi, Marshall Cork, MD   10 mg at 06/24/19 2019  . pantoprazole (PROTONIX) EC tablet 40 mg  40 mg Oral Daily Poggi, Marshall Cork, MD   40 mg at 06/25/19 0807  . propranolol (INDERAL) tablet 40 mg  40 mg Oral BID Corky Mull, MD   40 mg at 06/24/19 2012  . sodium chloride (OCEAN) 0.65 % nasal spray 1-2 spray  1-2 spray Each Nare Daily PRN Poggi, Marshall Cork, MD      . sodium phosphate (FLEET) 7-19 GM/118ML enema 1 enema  1 enema Rectal Once PRN Poggi, Marshall Cork, MD      . sucralfate (CARAFATE) tablet 1 g  1 g Oral BID Poggi, Marshall Cork, MD   1 g at 06/25/19 0806  . traMADol (ULTRAM) tablet 50 mg  50 mg Oral Q6H PRN Poggi, Marshall Cork, MD      . traZODone (DESYREL) tablet 150 mg  150 mg Oral QHS PRN Poggi, Marshall Cork, MD   150 mg at 06/24/19 2323  . vitamin B-12 (CYANOCOBALAMIN) tablet 5,000 mcg  5,000 mcg Oral QPM Poggi, Marshall Cork, MD      . vitamin C (ASCORBIC ACID) tablet 500 mg  500 mg Oral BID Poggi, Marshall Cork, MD      . vitamin E capsule 400 Units  400 Units Oral Daily Poggi, Marshall Cork, MD         Discharge  Medications: Please see discharge summary for a list of discharge medications.  Relevant Imaging Results:  Relevant Lab Results:   Additional Information SSN: [id number]  Kalyani Maeda, Veronia Beets, LCSW

## 2019-06-25 NOTE — Evaluation (Signed)
Occupational Therapy Evaluation Patient Details Name: Christine Higgins MRN: [id number] DOB: 10/17/1955 Today's Date: 06/25/2019    History of Present Illness admitted for acute hospitalization s/p L reverse TSA (10/29/2), NWB, immobilized.   Clinical Impression   Patient was seen for an OT evaluation this date. Pt lives by herself and is modified independent at baseline. Pt has orders for LUE to be immobilized and will be NWBing per MD. Patient presents with impaired strength/ROM, pain, and sensation to *UE with block not completely resolved yet. These impairments result in a decreased ability to perform self care tasks requiring mod assist for UB dressing and bathing and min-mod assist for application of polar care and sling/immobilizer. Pt instructed in polar care mgt, sling/immobilizer mgt, ROM exercises for LUE (with instructions for no shoulder exercises until full sensation has returned), LUE precautions, adaptive strategies for ADL, positioning and considerations for sleep, and home/routines modifications to maximize falls prevention, safety, and independence. Handout provided. OT adjusted sling/immobilizer and polar care to improve comfort, optimize positioning, and to maximize skin integrity/safety. Pt performed bed mobility with supervision and noting mild dizziness that quickly resolved upon sitting up. Pt also noted briefly feeling SOB. On room air, O2 sats noted to be 86-87% and improving within 2 minutes to 93-97%. Pt noted to intermittently close her eyes requiring cues to improve alertness. Pt attributing this to needing more sleep. RN notified of alertness, SOB, and dizziness with positional changes. Will assess orthostatics next session. Pt verbalized understanding of all education/training provided and will benefit from skilled OT services to address these limitations and improve independence in daily tasks. Recommend follow up per surgeon. Pt noting that she wants to go to rehab due to  limited to no support/assist at home.       Follow Up Recommendations  Follow surgeon's recommendation for DC plan and follow-up therapies    Equipment Recommendations  3 in 1 bedside commode    Recommendations for Other Services       Precautions / Restrictions Precautions Precautions: Fall;Shoulder Type of Shoulder Precautions: NWBing LUE, no shoulder ROM Shoulder Interventions: Shoulder sling/immobilizer;At all times;Off for dressing/bathing/exercises Precaution Booklet Issued: Yes (comment) Restrictions Weight Bearing Restrictions: Yes LUE Weight Bearing: Non weight bearing      Mobility Bed Mobility Overal bed mobility: Needs Assistance Bed Mobility: Supine to Sit;Sit to Supine     Supine to sit: Supervision Sit to supine: Supervision      Transfers Overall transfer level: Needs assistance Equipment used: Straight cane Transfers: Sit to/from Stand Sit to Stand: Min guard              Balance Overall balance assessment: Needs assistance Sitting-balance support: No upper extremity supported;Feet supported Sitting balance-Leahy Scale: Good     Standing balance support: Single extremity supported Standing balance-Leahy Scale: Fair                             ADL either performed or assessed with clinical judgement   ADL Overall ADL's : Needs assistance/impaired Eating/Feeding: Modified independent Eating/Feeding Details (indicate cue type and reason): uses teeth to help open packets Grooming: Sitting;Modified independent   Upper Body Bathing: Sitting;Moderate assistance   Lower Body Bathing: Sit to/from stand;Moderate assistance;Minimal assistance   Upper Body Dressing : Sitting;Moderate assistance   Lower Body Dressing: Sit to/from stand;Moderate assistance;Minimal assistance   Toilet Transfer: Ambulation;Min guard  Vision Baseline Vision/History: Wears glasses Wears Glasses: Reading only Patient Visual  Report: No change from baseline Vision Assessment?: No apparent visual deficits     Perception     Praxis      Pertinent Vitals/Pain Pain Assessment: 0-10 Pain Score: 7  Pain Location: back pain, no shoulder pain Pain Descriptors / Indicators: Aching Pain Intervention(s): Limited activity within patient's tolerance;Monitored during session;Repositioned     Hand Dominance Right   Extremity/Trunk Assessment Upper Extremity Assessment Upper Extremity Assessment: LUE deficits/detail LUE Deficits / Details: immobilizer, block in place with pt reporting decreased sensation LUE: Unable to fully assess due to immobilization LUE Sensation: decreased light touch;decreased proprioception LUE Coordination: decreased fine motor;decreased gross motor   Lower Extremity Assessment Lower Extremity Assessment: Overall WFL for tasks assessed   Cervical / Trunk Assessment Cervical / Trunk Assessment: Normal   Communication Communication Communication: No difficulties   Cognition Arousal/Alertness: (intermittently lethargic, pt endorses needing more sleep, cues to remain alert during session) Behavior During Therapy: WFL for tasks assessed/performed Overall Cognitive Status: Within Functional Limits for tasks assessed                                 General Comments: follows commands, oriented x4, mostly alert but does close eyes at times and requires cues to improve alertness, pt attributes to not enough sleep   General Comments  with bed mobility pt initially endorsing mildly dizzy (she reports this happens frequently) and resolves quickly; endorses mild SOB, O2 sats on RA initially 86-87% increasing to 93% on RA within 2 minutes. Same occured back to bed. RN notified.    Exercises Other Exercises Other Exercises: Pt instructed in polar care mgt and sling mgt; both removed and placed back on with improved positioning and comfort per pt Other Exercises: pt instructed in  shoulder precautions, falls prevention, AE/DME, home/routines modifications, and hemi techniques for ADL tasks, and hand/wrist/elbow ex; handout provided   Shoulder Instructions      Home Living Family/patient expects to be discharged to:: Private residence Living Arrangements: Alone Available Help at Discharge: (pt denies assist available) Type of Home: House Home Access: Stairs to enter [employer] of Steps: 5 Entrance Stairs-Rails: Right Home Layout: One level     Bathroom Shower/Tub: Teacher, early years/pre: Handicapped height     Home Equipment: [place]in - single point;Shower seat          Prior Functioning/Environment Level of Independence: Independent with assistive device(s)        Comments: Mod indep with SPC for ADLs, household and community mobilization; endorses 2 falls within previous year; + driving.        OT Problem List: Decreased strength;Decreased range of motion;Decreased cognition;Pain;Impaired balance (sitting and/or standing);Cardiopulmonary status limiting activity;Decreased activity tolerance;Decreased knowledge of use of DME or AE;Impaired UE functional use;Decreased knowledge of precautions      OT Treatment/Interventions: Self-care/ADL training;Therapeutic exercise;Therapeutic activities;DME and/or AE instruction;Patient/family education;Balance training    OT Goals(Current goals can be found in the care plan section) Acute Rehab OT Goals Patient Stated Goal: patient expressing desire for rehab OT Goal Formulation: With patient Time For Goal Achievement: 07/09/19 Potential to Achieve Goals: Good ADL Goals Pt Will Perform Upper Body Dressing: with min assist;sitting Pt Will Perform Lower Body Dressing: with min guard assist;sit to/from stand Pt Will Transfer to Toilet: with supervision;ambulating Additional ADL Goal #1: Pt will independently instruct family/caregiver in polar care mgt  Additional ADL Goal #2: Pt will  independently instruct family/caregiver in shoulder sling/immobilizer mgt  OT Frequency: Min 2X/week   Barriers to D/C: Decreased caregiver support          Co-evaluation              AM-PAC OT "6 Clicks" Daily Activity     Outcome Measure Help from another person eating meals?: None Help from another person taking care of personal grooming?: None Help from another person toileting, which includes using toliet, bedpan, or urinal?: A Little Help from another person bathing (including washing, rinsing, drying)?: A Lot Help from another person to put on and taking off regular upper body clothing?: A Lot Help from another person to put on and taking off regular lower body clothing?: A Lot 6 Click Score: 17   End of Session Nurse Communication: Other (comment)(O2 sats w/ c/o SOB, intermittent alertness changes)  Activity Tolerance: Patient tolerated treatment well Patient left: in bed;with call bell/phone within reach;with bed alarm set  OT Visit Diagnosis: Other abnormalities of gait and mobility (R26.89);History of falling (Z91.81);Pain Pain - Right/Left: (back)                Time: [id number] OT Time Calculation (min): 37 min Charges:  OT General Charges $OT Visit: 1 Visit OT Evaluation $OT Eval Moderate Complexity: 1 Mod OT Treatments $Self Care/Home Management : 23-37 mins  Jeni Salles, MPH, MS, OTR/L ascom [phone] 06/25/19, 9:29 AM

## 2019-06-26 LAB — BASIC METABOLIC PANEL
Anion gap: 10 (ref 5–15)
BUN: 9 mg/dL (ref 8–23)
CO2: 27 mmol/L (ref 22–32)
Calcium: 9.1 mg/dL (ref 8.9–10.3)
Chloride: 97 mmol/L — ABNORMAL LOW (ref 98–111)
Creatinine, Ser: 0.65 mg/dL (ref 0.44–1.00)
GFR calc Af Amer: >60 mL/min (ref >60)
GFR calc non Af Amer: >60 mL/min (ref >60)
Glucose, Bld: 147 mg/dL — ABNORMAL HIGH (ref 70–99)
Potassium: 3.6 mmol/L (ref 3.5–5.1)
Sodium: 134 mmol/L — ABNORMAL LOW (ref 135–145)

## 2019-06-26 LAB — CBC
HCT: 31.3 % — ABNORMAL LOW (ref 36.0–46.0)
Hemoglobin: 10.6 g/dL — ABNORMAL LOW (ref 12.0–15.0)
MCH: 27.2 pg (ref 26.0–34.0)
MCHC: 33.9 g/dL (ref 30.0–36.0)
MCV: 80.5 fL (ref 80.0–100.0)
Platelets: 171 10*3/uL (ref 150–400)
RBC: 3.89 MIL/uL (ref 3.87–5.11)
RDW: 13.7 % (ref 11.5–15.5)
WBC: 6.1 10*3/uL (ref 4.0–10.5)
nRBC: 0 % (ref 0.0–0.2)

## 2019-06-26 MED ORDER — OXYCODONE HCL 5 MG PO TABS: 5.0000 mg | ORAL_TABLET | ORAL | 0 refills | Status: DC | PRN

## 2019-06-26 MED ORDER — TRAMADOL HCL 50 MG PO TABS: 50.0000 mg | ORAL_TABLET | Freq: Four times a day (QID) | ORAL | 0 refills | Status: DC | PRN

## 2019-06-26 MED ORDER — PHENOL 1.4 % MT LIQD
1.0000 | OROMUCOSAL | Status: DC | PRN
  Filled 2019-06-26: qty 177

## 2019-06-26 MED ORDER — APIXABAN 2.5 MG PO TABS: 2.5000 mg | ORAL_TABLET | Freq: Two times a day (BID) | ORAL | 0 refills | Status: DC

## 2019-06-26 MED ORDER — MENTHOL 3 MG MT LOZG
1.0000 | LOZENGE | OROMUCOSAL | Status: DC | PRN
  Filled 2019-06-26 (×2): qty 9

## 2019-06-26 NOTE — Discharge Instructions (Signed)
Diet: As you were doing prior to hospitalization   Shower:  May shower but keep the wounds dry, use an occlusive plastic wrap, NO SOAKING IN TUB.  If the bandage gets wet, change with a clean dry gauze.  Dressing:  You may change your dressing as needed. Change the dressing with sterile gauze dressing.    Activity:  Increase activity slowly as tolerated, but follow the weight bearing instructions below.  No lifting or driving for 6 weeks.  Weight Bearing:   Non-weightbearing to the left arm.  Blood Clot Prevention: Take eliquis as prescribed for 14 days following surgery.  To prevent constipation: you may use a stool softener such as -  Colace (over the counter) 100 mg by mouth twice a day  Drink plenty of fluids (prune juice may be helpful) and high fiber foods Miralax (over the counter) for constipation as needed.    Itching:  If you experience itching with your medications, try taking only a single pain pill, or even half a pain pill at a time.  You may take up to 10 pain pills per day, and you can also use benadryl over the counter for itching or also to help with sleep.   Precautions:  If you experience chest pain or shortness of breath - call 911 immediately for transfer to the hospital emergency department!!  If you develop a fever greater that 101 F, purulent drainage from wound, increased redness or drainage from wound, or calf pain-Call [facility]                                              Follow- Up Appointment:  Please call for an appointment to be seen in 2 weeks at [facility]

## 2019-06-26 NOTE — Progress Notes (Signed)
Occupational Therapy Treatment Patient Details Name: Christine Higgins MRN: [id number] DOB: 1955/11/28 Today's Date: 06/26/2019    History of present illness admitted for acute hospitalization s/p L reverse TSA (10/29/2), NWB, immobilized.   OT comments  Pt progressing very well in OT services towards goals - more alert today. Cont to show decrease safety in functional mobility in ADL's , leaving cane and impulsive putting her at risk for falls. LB dressing address with and without AE using hemi tech - because of R knee pain and swelling limited in independence. Recommend further OT services to increase I in ADL's , functional mobility and ed on HEP for L UE elbow to hand and management of sling .   Follow Up Recommendations       Equipment Recommendations  3 in 1 bedside commode    Recommendations for Other Services      Precautions / Restrictions Precautions Precautions: Shoulder Type of Shoulder Precautions: NWBing LUE, no shoulder ROM Shoulder Interventions: Shoulder sling/immobilizer;At all times;Off for dressing/bathing/exercises Restrictions Weight Bearing Restrictions: Yes LUE Weight Bearing: Non weight bearing       Mobility Bed Mobility Overal bed mobility: Needs Assistance Bed Mobility: Supine to Sit     Supine to sit: Supervision     General bed mobility comments: heavy use of rail and HOB raised  Transfers Overall transfer level: Needs assistance Equipment used: Straight cane Transfers: Sit to/from Stand Sit to Stand: (contact guard)         General transfer comment: CG but needed reminder to remember her cane and use it    Balance Overall balance assessment: Needs assistance Sitting-balance support: No upper extremity supported;Feet supported Sitting balance-Leahy Scale: Good     Standing balance support: Single extremity supported Standing balance-Leahy Scale: Fair                             ADL either performed or assessed with  clinical judgement   ADL                                               Vision       Perception     Praxis      Cognition Arousal/Alertness: Awake/alert Behavior During Therapy: WFL for tasks assessed/performed Overall Cognitive Status: Within Functional Limits for tasks assessed                                          Exercises Other Exercises Other Exercises: SIt to stand from [facility] supervision and independent in hygiene and standing at sink to wash hands - did need reminder for cane when walking to room Other Exercises: pt instructed in shoulder precautions, falls prevention, AE/DME, home/routines modifications, Other Exercises: LB dressing forL sock independent in hemitech - but needed min A for R foot - because of knee pain and swelling Other Exercises: AROM for wrist done in all planes and tendon glides for hand - report some pain in forearm to shoulder Other Exercises: ed pt on use of polar care and demo adjusting sling for comfort and positioning of arm -   Shoulder Instructions       General Comments      Pertinent Vitals/ Pain  Pain Assessment: 0-10 Pain Score: 5  Pain Location: some increased shoulder pain and Knees/hips due to arthritis Pain Descriptors / Indicators: Aching Pain Intervention(s): Monitored during session;Limited activity within patient's tolerance  Home Living                                          Prior Functioning/Environment              Frequency           Progress Toward Goals  OT Goals(current goals can now be found in the care plan section)  Progress towards OT goals: Progressing toward goals  Acute Rehab OT Goals Patient Stated Goal: patient expressing desire for rehab OT Goal Formulation: With patient Time For Goal Achievement: 07/09/19 Potential to Achieve Goals: Good  Plan Discharge plan remains appropriate    Co-evaluation                  AM-PAC OT "6 Clicks" Daily Activity     Outcome Measure                    End of Session Equipment Utilized During Treatment: Gait belt  Pain - Right/Left: Left   Activity Tolerance Patient tolerated treatment well   Patient Left in bed;with call bell/phone within reach;with bed alarm set   Nurse Communication          Time: 1024-1050 OT Time Calculation (min): 26 min  Charges: OT General Charges $OT Visit: 1 Visit OT Treatments $Self Care/Home Management : 8-22 mins $Therapeutic Exercise: 8-22 mins   Roarke Marciano OTR/L,CLT 06/26/2019, 2:01 PM

## 2019-06-26 NOTE — Discharge Summary (Addendum)
Physician Discharge Summary  Patient ID: Christine Higgins MRN: [id number] DOB/AGE: 1955/12/23 63 y.o.  Admit date: 06/24/2019 Discharge date: 06/28/19  Admission Diagnoses:  NONTRAUMATIC COMPLETE TEAR OF LEFT ROTATOR CUFF.  ROTATOR CUFF TENDINITIS, LEFT.  Discharge Diagnoses: Patient Active Problem List   Diagnosis Date Noted  . Status post reverse arthroplasty of shoulder, left 06/24/2019  . History of lumbar laminectomy for spinal cord decompression (L3-L4, 2015) 02/25/2019  . Lumbar radiculopathy 02/25/2019  . Tachycardia 08/18/2017  . Status post total left knee replacement 08/11/2017  . Chronic pain syndrome 07/30/2017  . Fibromyalgia 07/30/2017  . Pelvic pain in female 06/24/2017  . Dysuria 06/24/2017  . Right carpal tunnel syndrome 03/27/2017  . HTN, goal below 140/90 03/05/2017  . Graves' orbitopathy 02/17/2017  . Primary osteoarthritis of right knee 10/18/2016  . Primary osteoarthritis of left knee 10/18/2016  . Bilateral arm weakness 07/16/2016  . Depression, major, in remission ([place]) 12/21/2015  . Back pain at L4-L5 level 10/30/2015  . DDD (degenerative disc disease), lumbosacral 10/30/2015  . Status post total replacement of right hip 08/21/2015  . Leg pain 07/28/2015  . Primary osteoarthritis of both knees 07/28/2015  . Health care maintenance 06/21/2015  . Impingement syndrome of shoulder, left 10/20/2014  . Severe obesity (BMI 35.0-35.9 with comorbidity) ([place]) 08/09/2014  . Atherosclerosis of abdominal aorta ([place]) 07/29/2014  . DM II (diabetes mellitus, type II), controlled ([place]) 04/19/2014  . Hemorrhoids 03/14/2014  . Osteoporosis 02/03/2014  . Migraines 02/03/2014  . Hyperlipidemia, unspecified 02/03/2014  . GERD (gastroesophageal reflux disease) 02/03/2014    Past Medical History:  Diagnosis Date  . Anxiety   . Asthma   . Breast mass    LEFT x 3 months per pt and palpated by physician  . Cervical disc disease   . Chronic pain syndrome   .  Degenerative disc disease, lumbar   . Depression   . Fibromyalgia   . Fibromyalgia   . GERD (gastroesophageal reflux disease)   . Glaucoma   . Graves disease   . Hemorrhoids   . Hyperlipidemia   . Hypertension   . Thyroid disease      Transfusion: None.   Consultants (if any):   Discharged Condition: Improved  Hospital Course: Christine Higgins is an 63 y.o. female who was admitted 06/24/2019 with a diagnosis of a massive irreparable rotator cuff tear with cuff arthropathy of the left shoulder and went to the operating room on 06/24/2019 and underwent the above named procedures.    Surgeries: Procedure(s): REVERSE SHOULDER ARTHROPLASTY on 06/24/2019 Patient tolerated the surgery well. Taken to PACU where she was stabilized and then transferred to the orthopedic floor.  Started on Lovenox 40mg  q 24 hrs. Foot pumps applied bilaterally at 80 mm. Heels elevated on bed with rolled towels. No evidence of DVT. Negative [place]. Physical therapy started on day #1 for gait training and transfer. OT started day #1 for ADL and assisted devices.  Patient does have baseline SOB, CXR demonstrated bilateral atelectasis.  Incentive spirometer used, on 98% room air without tachycardia.  Patient's IV was removed on POD2.  Implants: All press-fit [employer] Comprehensive system with a #14 micro-humeral stem, a 40 mm humeral tray with a standard insert, and a mini-base plate with a 36 mm glenosphere.  She was given perioperative antibiotics:  Anti-infectives (From admission, onward)   Start     Dose/Rate Route Frequency Ordered Stop   06/24/19 2000  ciprofloxacin (CIPRO) tablet 750 mg     750  mg Oral 2 times daily 06/24/19 1853 06/25/19 0946   06/24/19 1500  clindamycin (CLEOCIN) IVPB 900 mg     900 mg 100 mL/hr over 30 Minutes Intravenous Every 6 hours 06/24/19 1420 06/25/19 0247   06/24/19 1420  clindamycin (CLEOCIN) 900 MG/50ML IVPB    Note to Pharmacy: Christine Higgins, Christine Higgins   : cabinet override       06/24/19 1420 06/24/19 1427   06/24/19 0830  clindamycin (CLEOCIN) IVPB 900 mg     900 mg 100 mL/hr over 30 Minutes Intravenous  Once 06/24/19 0715 06/24/19 0844   06/24/19 0711  clindamycin (CLEOCIN) 900 MG/50ML IVPB    Note to Pharmacy: Christine Higgins   : cabinet override      06/24/19 0711 06/24/19 0829   06/24/19 0600  ceFAZolin (ANCEF) IVPB 2g/100 mL premix  Status:  Discontinued     2 g 200 mL/hr over 30 Minutes Intravenous  Once 06/24/19 0545 06/24/19 0715    .  She was given sequential compression devices, early ambulation, and Lovenox for DVT prophylaxis.  She benefited maximally from the hospital stay and there were no complications.    Recent vital signs:  Vitals:   06/28/19 0049 06/28/19 0733  BP: 137/85 114/67  Pulse: 92 82  Resp: 16 16  Temp: 98.7 F (37.1 C) 98.2 F (36.8 C)  SpO2: 99% 98%    Recent laboratory studies:  Lab Results  Component Value Date   HGB 10.6 (L) 06/26/2019   HGB 10.0 (L) 06/25/2019   HGB 12.5 06/21/2019   Lab Results  Component Value Date   WBC 6.1 06/26/2019   PLT 171 06/26/2019   Lab Results  Component Value Date   INR 0.94 08/01/2017   Lab Results  Component Value Date   NA 131 (L) 06/27/2019   K 3.3 (L) 06/27/2019   CL 93 (L) 06/27/2019   CO2 30 06/27/2019   BUN 8 06/27/2019   CREATININE 0.51 06/27/2019   GLUCOSE 149 (H) 06/27/2019    Discharge Medications:   Allergies as of 06/28/2019      Reactions   Cephalexin Hives   Ibuprofen Nausea And Vomiting   Flu like symptoms   Shellfish Allergy Nausea And Vomiting   Aspirin Nausea And Vomiting      Medication List    STOP taking these medications   celecoxib 200 MG capsule Commonly known as: CELEBREX     TAKE these medications   ALIVE WOMENS ENERGY PO Take 1 tablet by mouth daily.   apixaban 2.5 MG Tabs tablet Commonly known as: Eliquis Take 1 tablet (2.5 mg total) by mouth 2 (two) times daily for 14 days.   atorvastatin 10 MG tablet Commonly known  as: LIPITOR Take 10 mg by mouth daily after supper.   budesonide-formoterol 160-4.5 MCG/ACT inhaler Commonly known as: SYMBICORT Inhale 2 puffs into the lungs 2 (two) times daily.   calcium carbonate 600 MG Tabs tablet Commonly known as: OS-CAL Take 1 tablet by mouth every evening.   ciprofloxacin 750 MG tablet Commonly known as: CIPRO Take 750 mg by mouth 2 (two) times daily.   clobetasol cream 0.05 % Commonly known as: TEMOVATE Apply 1 application topically 2 (two) times daily as needed (irritation).   clonazePAM 1 MG tablet Commonly known as: KLONOPIN Take 1 tablet (1 mg total) by mouth at bedtime as needed (for sleep/anxiety).   clotrimazole-betamethasone cream Commonly known as: LOTRISONE Apply 1 application topically 2 (two) times daily as needed (irritation).  CoQ-10 200 MG Caps Take 1 capsule by mouth daily.   cyclobenzaprine 5 MG tablet Commonly known as: FLEXERIL Take 5 mg by mouth 2 (two) times daily as needed for muscle spasms.   diphenhydrAMINE 25 MG tablet Commonly known as: BENADRYL Take 50 mg by mouth 2 (two) times daily as needed for allergies.   DULoxetine 60 MG capsule Commonly known as: CYMBALTA Take 60 mg by mouth every evening. Total daily dose=90   DULoxetine 30 MG capsule Commonly known as: CYMBALTA Take 30 mg by mouth every evening. Total dose=90 mg   esomeprazole 40 MG capsule Commonly known as: NEXIUM Take 40 mg by mouth 2 (two) times daily.   gabapentin 300 MG capsule Commonly known as: NEURONTIN Take 2 capsules (600 mg total) by mouth 3 (three) times daily.   hydrALAZINE 25 MG tablet Commonly known as: APRESOLINE Take 25 mg by mouth 3 (three) times daily as needed (blood pressure 160/100 or higher).   hydrOXYzine 10 MG tablet Commonly known as: ATARAX/VISTARIL Take 30 mg by mouth at bedtime.   ICY HOT EX Apply 1 application topically 3 (three) times daily as needed (for pain.).   linaclotide 290 MCG Caps capsule Commonly  known as: LINZESS Take 290 mcg by mouth daily as needed (constipation).   losartan-hydrochlorothiazide 100-25 MG tablet Commonly known as: HYZAAR Take 1 tablet by mouth daily.   methimazole 10 MG tablet Commonly known as: TAPAZOLE Take 10 mg by mouth daily.   OPCON-A OP Place 1 drop into both eyes 4 (four) times daily as needed.   oxyCODONE 5 MG immediate release tablet Commonly known as: Oxy IR/ROXICODONE Take 1-2 tablets (5-10 mg total) by mouth every 4 (four) hours as needed for moderate pain.   PRENATAL 1 PO Take 1 tablet by mouth daily.   PROBIOTIC PO Take 1 capsule by mouth daily.   propranolol 40 MG tablet Commonly known as: INDERAL Take 40 mg by mouth 2 (two) times daily.   sodium chloride 0.65 % Soln nasal spray Commonly known as: OCEAN Place 1-2 sprays into both nostrils daily as needed for congestion (for moisture/congestion.).   sucralfate 1 g tablet Commonly known as: CARAFATE Take 1 g by mouth 2 (two) times daily.   Systane Balance 0.6 % Soln Generic drug: Propylene Glycol Place 1 drop into both eyes 2 (two) times daily.   traMADol 50 MG tablet Commonly known as: ULTRAM Take 1 tablet (50 mg total) by mouth every 6 (six) hours as needed for moderate pain.   traZODone 150 MG tablet Commonly known as: DESYREL Take 150 mg by mouth at bedtime as needed for sleep.   TURMERIC PO Take 1,000 mg by mouth daily.   TYLENOL ARTHRITIS EXT RELIEF PO Take 2 tablets by mouth every 8 (eight) hours as needed.   Vitamin B-12 5000 MCG Tbdp Take 5,000 mcg by mouth every evening.   vitamin C 1000 MG tablet Take 500 mg by mouth 2 (two) times daily.   Vitamin D3 25 MCG (1000 UT) Caps Take 2,000 Units by mouth every evening.   VITAMIN E PO Take 400 Units by mouth daily.      Diagnostic Studies: Dg Chest Port 1 View  Result Date: 06/25/2019 CLINICAL DATA:  Post LEFT shoulder replacement, now with shortness of breath; past history of hypertension, breast  cancer, asthma, former smoker EXAM: PORTABLE CHEST 1 VIEW COMPARISON:  Portable exam 1113 hours compared to 04/26/2017 FINDINGS: Borderline enlargement of cardiac silhouette. Mediastinal contours and pulmonary vascularity normal.  Mild bibasilar atelectasis. Lungs otherwise clear. No infiltrate, pleural effusion or pneumothorax. Bones demineralized with evidence of a new LEFT reverse shoulder arthroplasty. Prior cervical spine fusion. IMPRESSION: Bibasilar atelectasis. Electronically Signed   By: Lavonia Dana M.D.   On: 06/25/2019 11:53   Dg Shoulder Left Port  Result Date: 06/24/2019 CLINICAL DATA:  Left shoulder replacement EXAM: LEFT SHOULDER - 1 VIEW COMPARISON:  02/04/2018 FINDINGS: Changes of left shoulder replacement. Small bone fragments are noted along the posterior glenoid. Normal alignment. IMPRESSION: Left shoulder replacement. Small bone fragments along the posterior glenoid. Electronically Signed   By: Rolm Baptise M.D.   On: 06/24/2019 11:32   Dg Pain Clinic C-arm 1-60 Min No Report  Result Date: 06/14/2019 Fluoro was used, but no Radiologist interpretation will be provided. Please refer to "NOTES" tab for provider progress note.  [place] Or Nerve Block-image Only ([facility])  Result Date: 06/24/2019 There is no interpretation for this exam.  This order is for images obtained during a surgical procedure.  Please See "Surgeries" Tab for more information regarding the procedure.   Dg Esophagus W Double Cm (hd)  Result Date: 06/18/2019 CLINICAL DATA:  Dysphagia. EXAM: ESOPHOGRAM / BARIUM SWALLOW / BARIUM TABLET STUDY TECHNIQUE: Combined double contrast and single contrast examination performed using effervescent crystals, thick barium liquid, and thin barium liquid. The patient was observed with fluoroscopy swallowing a 13 mm barium sulphate tablet. FLUOROSCOPY TIME:  Fluoroscopy Time:  1 minutes 42 seconds Radiation Exposure Index (if provided by the fluoroscopic device): 40.9 mGy COMPARISON:   Cervical spine 02/05/2018. FINDINGS: Benign-appearing calcification noted over the right neck, unchanged. Prior cervical spine fusion. Cervical esophagus is widely patent. Thoracic esophagus is widely patent. Peristalsis normal. No reflux. No prominent hiatal hernia. 13 mm barium tablet passes normally. IMPRESSION: Normal exam Electronically Signed   By: Marcello Moores  Register   On: 06/18/2019 09:56   Disposition:  Plan for d/c to either SNF or home with HHPT today.   Contact information for follow-up providers    Lattie Corns, PA-C Follow up in 14 day(s).   Specialty: Physician Assistant Why: Electa Sniff information: [place] [address] [phone]            Contact information for after-discharge care    [facility] .   Service: Skilled Nursing Contact information: [place] [address] [phone]                 Signed: Judson Roch PA-C 06/28/2019, 7:45 AM

## 2019-06-26 NOTE — Progress Notes (Addendum)
Subjective: 2 Days Post-Op Procedure(s) (LRB): REVERSE SHOULDER ARTHROPLASTY (Left) Patient reports pain as mild to the left shoulder.  Patient is well but is very drowsy and somnolent this morning. Patient falling asleep when answering questions. Possible d/c home with HHPT and Home health aide. Negative for chest pain and shortness of breath Fever: no Gastrointestinal:Negative for nausea and vomiting  Objective: Vital signs in last 24 hours: Temp:  [98.2 F (36.8 C)-99.2 F (37.3 C)] 98.2 F (36.8 C) (10/31 0758) Pulse Rate:  [82-99] 98 (10/31 0758) Resp:  [16-18] 16 (10/31 0758) BP: (111-115)/(59-68) 115/68 (10/31 0758) SpO2:  [95 %-98 %] 98 % (10/31 0758)  Intake/Output from previous day:  Intake/Output Summary (Last 24 hours) at 06/26/2019 0921 Last data filed at 06/25/2019 1830 Gross per 24 hour  Intake 1090.39 ml  Output -  Net 1090.39 ml    Intake/Output this shift: No intake/output data recorded.  Labs: Recent Labs    06/25/19 0553 06/26/19 0440  HGB 10.0* 10.6*   Recent Labs    06/25/19 0553 06/26/19 0440  WBC 4.8 6.1  RBC 3.72* 3.89  HCT 30.6* 31.3*  PLT 153 171   Recent Labs    06/25/19 0553 06/26/19 0440  NA 132* 134*  K 3.3* 3.6  CL 96* 97*  CO2 25 27  BUN 11 9  CREATININE 0.80 0.65  GLUCOSE 112* 147*  CALCIUM 8.2* 9.1   No results for input(s): LABPT, INR in the last 72 hours.   EXAM General - Patient is Alert but patient is having a hard time keeping her eyes open while answering questions. Lungs clear to auscultation.  No wheeze, rub or ronchi. Extremity - ABD soft Intact pulses distally Dorsiflexion/Plantar flexion intact Incision: dressing C/D/I No cellulitis present Compartment soft  Decreased sensation to light touch to the left arm this morning from anesthesia block. Dressing/Incision - clean, dry, no drainage Motor Function - intact, moving foot and toes well on exam.   Past Medical History:  Diagnosis Date  .  Anxiety   . Asthma   . Breast mass    LEFT x 3 months per pt and palpated by physician  . Cervical disc disease   . Chronic pain syndrome   . Degenerative disc disease, lumbar   . Depression   . Fibromyalgia   . Fibromyalgia   . GERD (gastroesophageal reflux disease)   . Glaucoma   . Graves disease   . Hemorrhoids   . Hyperlipidemia   . Hypertension   . Thyroid disease     Assessment/Plan: 2 Days Post-Op Procedure(s) (LRB): REVERSE SHOULDER ARTHROPLASTY (Left) Active Problems:   Status post reverse arthroplasty of shoulder, left  Estimated body mass index is 36.26 kg/m as calculated from the following:   Height as of this encounter: 5\' 11"  (1.803 m).   Weight as of this encounter: 117.9 kg. Up with therapy   Labs reviewed this morning. WBC 6.1 this morning.  Hg 10.6 Patient is 98% on room air.  CXR showed atelectasis.  Encouraged incentive spirometer. Patient very somnolent this morning.  Will attempt to decrease pain meds this morning.  Patient received Oxycodone, trazadone and Klonopin all at the same time last night. Block from surgery still in effect to the left arm. Up with therapy today. Possible d/c home today based on progress with PT. Patient is unable to take aspirin, will plan on d/c with Eliquis for blood clot prevention.  DVT Prophylaxis - Lovenox, Foot Pumps and TED hose Non-weightbearing to  the left arm.  Raquel Cordie Beazley, PA-C [facility] Orthopaedic Surgery 06/26/2019, 9:21 AM

## 2019-06-26 NOTE — Progress Notes (Signed)
Physical Therapy Treatment Patient Details Name: MARLEENA MCKEEL MRN: [id number] DOB: 01-22-1956 Today's Date: 06/26/2019    History of Present Illness admitted for acute hospitalization s/p L reverse TSA (10/29/2), NWB, immobilized.    PT Comments    Pt reports some increased pain today.  To edge of bed with HOB raised and rails with supervision but anticipate pt would have more difficulty on flat bed at home.  Walked 100' with SPC and min guard x 2.  Seated rest before step training.  Up 3 steps forward and back down due to increased difficulty this attempt leaning on L rail for support.  She requested ace wrap to R knee for support - stated she uses one at home.  Step quality was improved and she was able to complete 4 steps with min guard/asisst +2 but remain a high fall risk.  Returned to room in bathroom per her request.  Gait remains generally unsafe despite cues and education.  She has decreased step height and length and does not advance cane consistently with each step which increases her fall risk.  She frequently will pick cane up mid stride and point to objects in hallway while lurching forward.  She ran into doorway with L UE with poor awareness of clearance.  Pt remains at an increased fall risk and voices generally fear with mobility.  SNF remains appropriate upon discharge.   Follow Up Recommendations  SNF     Equipment Recommendations  3in1 (PT)    Recommendations for Other Services       Precautions / Restrictions Precautions Precautions: Fall;Shoulder Type of Shoulder Precautions: NWBing LUE, no shoulder ROM Shoulder Interventions: Shoulder sling/immobilizer;At all times;Off for dressing/bathing/exercises Restrictions Weight Bearing Restrictions: Yes LUE Weight Bearing: Non weight bearing    Mobility  Bed Mobility Overal bed mobility: Needs Assistance Bed Mobility: Supine to Sit     Supine to sit: Supervision     General bed mobility comments: heavy use  of rail and HOB raised  Transfers Overall transfer level: Needs assistance Equipment used: Straight cane Transfers: Sit to/from Stand Sit to Stand: Min guard         General transfer comment: requires use of R UE to assist with lift off  Ambulation/Gait Ambulation/Gait assistance: Min assist;+2 safety/equipment Gait Distance (Feet): 100 Feet   Gait Pattern/deviations: Step-through pattern;Decreased step length - right;Decreased step length - left;Trunk flexed Gait velocity: decreased   General Gait Details: Remains generally unsteay but no LOB's requring outside asssit.  Runs into doorway wiht L UE brace, decreased step height bilaterally along wiht dec step length, poor advancement of cane at times and will pick can up to point at objects mid step increasing fall risk   Stairs Stairs: Yes Stairs assistance: Min assist;+2 physical assistance;+2 safety/equipment Stair Management: One rail Right Number of Stairs: 4 General stair comments: 2 attempts to complete sucessfully.  On second attept ace wrap applied to R knee per her request.  Stated she uses one at home for support.  It did improve her step quality but continues to require +2 for safety.   Wheelchair Mobility    Modified Rankin (Stroke Patients Only)       Balance Overall balance assessment: Needs assistance Sitting-balance support: No upper extremity supported;Feet supported Sitting balance-Leahy Scale: Good     Standing balance support: Single extremity supported Standing balance-Leahy Scale: Fair  Cognition Arousal/Alertness: Awake/alert Behavior During Therapy: WFL for tasks assessed/performed;Impulsive Overall Cognitive Status: Within Functional Limits for tasks assessed                                        Exercises      General Comments        Pertinent Vitals/Pain Pain Assessment: 0-10 Pain Score: 5  Pain Location: some increased  shoulder pain and Knees/hips due to arthritis Pain Descriptors / Indicators: Aching;Sore Pain Intervention(s): Limited activity within patient's tolerance;Monitored during session;Repositioned    Home Living                      Prior Function            PT Goals (current goals can now be found in the care plan section) Progress towards PT goals: Progressing toward goals    Frequency    7X/week      PT Plan Current plan remains appropriate    Co-evaluation              AM-PAC PT "6 Clicks" Mobility   Outcome Measure  Help needed turning from your back to your side while in a flat bed without using bedrails?: A Little Help needed moving from lying on your back to sitting on the side of a flat bed without using bedrails?: A Little Help needed moving to and from a bed to a chair (including a wheelchair)?: A Little Help needed standing up from a chair using your arms (e.g., wheelchair or bedside chair)?: A Little Help needed to walk in hospital room?: A Little Help needed climbing 3-5 steps with a railing? : A Lot 6 Click Score: 17    End of Session Equipment Utilized During Treatment: Gait belt Activity Tolerance: Patient tolerated treatment well Patient left: Other (comment);with call bell/phone within reach Nurse Communication: Other (comment);Mobility status Pain - Right/Left: Left Pain - part of body: Shoulder     Time: [id number] PT Time Calculation (min) (ACUTE ONLY): 30 min  Charges:  $Gait Training: 23-37 mins                    Chesley Noon, PTA 06/26/19, 10:55 AM

## 2019-06-27 LAB — BASIC METABOLIC PANEL
Anion gap: 8 (ref 5–15)
BUN: 8 mg/dL (ref 8–23)
CO2: 30 mmol/L (ref 22–32)
Calcium: 9.1 mg/dL (ref 8.9–10.3)
Chloride: 93 mmol/L — ABNORMAL LOW (ref 98–111)
Creatinine, Ser: 0.51 mg/dL (ref 0.44–1.00)
GFR calc Af Amer: >60 mL/min (ref >60)
GFR calc non Af Amer: >60 mL/min (ref >60)
Glucose, Bld: 149 mg/dL — ABNORMAL HIGH (ref 70–99)
Potassium: 3.3 mmol/L — ABNORMAL LOW (ref 3.5–5.1)
Sodium: 131 mmol/L — ABNORMAL LOW (ref 135–145)

## 2019-06-27 NOTE — Progress Notes (Signed)
Subjective: Patient is complaining of left shoulder pain and low back pain   Objective: Vital signs in last 24 hours: Temp:  [98.2 F (36.8 C)-99.4 F (37.4 C)] 98.6 F (37 C) (11/01 0719) Pulse Rate:  [90-100] 90 (11/01 0719) Resp:  [18-20] 18 (11/01 0719) BP: (123-141)/(64-72) 137/64 (11/01 0719) SpO2:  [93 %-98 %] 94 % (11/01 0719)  Intake/Output from previous day: 10/31 0701 - 11/01 0700 In: 920 [P.O.:920] Out: -  Intake/Output this shift: No intake/output data recorded.  Recent Labs    06/25/19 0553 06/26/19 0440  HGB 10.0* 10.6*   Recent Labs    06/25/19 0553 06/26/19 0440  WBC 4.8 6.1  RBC 3.72* 3.89  HCT 30.6* 31.3*  PLT 153 171   Recent Labs    06/26/19 0440 06/27/19 0703  NA 134* 131*  K 3.6 3.3*  CL 97* 93*  CO2 27 30  BUN 9 8  CREATININE 0.65 0.51  GLUCOSE 147* 149*  CALCIUM 9.1 9.1   No results for input(s): LABPT, INR in the last 72 hours.  Neurovascular intact Sensation intact distally Polar Care in place.  Points to right low back is an area of pain secondary to immobilization.  Requesting K pad  Assessment/Plan: Status post left reverse shoulder replacement with slow mobility.  Will order K pad for low back pain.  Discharge planning in progress   Hessie Knows 06/27/2019, 11:08 AM

## 2019-06-27 NOTE — Plan of Care (Signed)
  Problem: Education: Goal: Knowledge of the prescribed therapeutic regimen will improve Outcome: Progressing Goal: Understanding of activity limitations/precautions following surgery will improve Outcome: Progressing Goal: Individualized Educational Video(s) Outcome: Progressing   Problem: Activity: Goal: Ability to tolerate increased activity will improve Outcome: Progressing   Problem: Pain Management: Goal: Pain level will decrease with appropriate interventions Outcome: Progressing   Problem: Education: Goal: Knowledge of General Education information will improve Description: Including pain rating scale, medication(s)/side effects and non-pharmacologic comfort measures Outcome: Progressing

## 2019-06-27 NOTE — Progress Notes (Signed)
Physical Therapy Treatment Patient Details Name: Christine Higgins MRN: [id number] DOB: 1956/06/12 Today's Date: 06/27/2019    History of Present Illness admitted for acute hospitalization s/p L reverse TSA (10/29/2), NWB, immobilized.    PT Comments    Pt reported UT and low back pain upon PT entering room.  She appeared lethargic but was able to stand and walk 100 ft, +2 CGA with use of cane.  Pt still impulsive at times, pointing at objects with cane and moving them out of the way.  She reported knee pain that was "not as bad this morning but is getting worse".  ACE bandage still donned from night before.  Pt very lethargic during there ex and education, closing her eyes and appearing to fall asleep.  PT introduced strengthening and stretching there ex to manage UT and LBP.  No pain level reported at end as pt was asleep again, resting on heating bad.  She was able to demonstrate understanding of some exercises but unable to complete full sets before closing eyes.  Pt will continue to benefit from skilled PT with focus on strength, tolerance to activity and safe functional mobility.  Follow Up Recommendations  SNF     Equipment Recommendations  3in1 (PT)    Recommendations for Other Services       Precautions / Restrictions Precautions Precautions: Shoulder Type of Shoulder Precautions: NWBing LUE, no shoulder ROM Shoulder Interventions: Shoulder sling/immobilizer;At all times;Off for dressing/bathing/exercises Restrictions LUE Weight Bearing: Non weight bearing    Mobility  Bed Mobility Overal bed mobility: (In chair.)         Sit to supine: Min guard;+2 for safety/equipment   General bed mobility comments: Uses arms of chair to stand and then cane to steady herself.  Transfers                    Ambulation/Gait Ambulation/Gait assistance: Min guard;+2 safety/equipment Gait Distance (Feet): 100 Feet Assistive device: Quad cane     Gait velocity  interpretation: <1.8 ft/sec, indicate of risk for recurrent falls General Gait Details: Generally unsteady, step length and foot clearance does improve with increased walking distance.  Pt still impulsive, lifting cane to move objects from floor.   Stairs             Wheelchair Mobility    Modified Rankin (Stroke Patients Only)       Balance Overall balance assessment: Needs assistance Sitting-balance support: No upper extremity supported;Feet supported Sitting balance-Leahy Scale: Good     Standing balance support: Single extremity supported Standing balance-Leahy Scale: Fair                              Cognition Arousal/Alertness: Lethargic Behavior During Therapy: WFL for tasks assessed/performed Overall Cognitive Status: Within Functional Limits for tasks assessed                                 General Comments: Follows commands consistently; fell asleep repeatedly with each exercise.      Exercises Other Exercises Other Exercises: Ther ex. : scapular retraction, UT strength bilaterally, scalene stretch bilaterally, seated pelvic tilt with tRa contraction, glut squeeze for LBP x10 each.    General Comments        Pertinent Vitals/Pain Faces Pain Scale: Hurts little more Pain Location: L upper trap and R upper trap/low back. Pain Descriptors /  Indicators: Aching Pain Intervention(s): Limited activity within patient's tolerance;Monitored during session;Premedicated before session    Home Living                      Prior Function            PT Goals (current goals can now be found in the care plan section) Acute Rehab PT Goals Patient Stated Goal: patient expressing desire for rehab Progress towards PT goals: Progressing toward goals    Frequency    7X/week      PT Plan Current plan remains appropriate    Co-evaluation              AM-PAC PT "6 Clicks" Mobility   Outcome Measure  Help needed  turning from your back to your side while in a flat bed without using bedrails?: A Little Help needed moving from lying on your back to sitting on the side of a flat bed without using bedrails?: A Little Help needed moving to and from a bed to a chair (including a wheelchair)?: A Little Help needed standing up from a chair using your arms (e.g., wheelchair or bedside chair)?: A Little Help needed to walk in hospital room?: A Little Help needed climbing 3-5 steps with a railing? : A Lot 6 Click Score: 17    End of Session Equipment Utilized During Treatment: Gait belt Activity Tolerance: Patient tolerated treatment well Patient left: Other (comment);with call bell/phone within reach Nurse Communication: Other (comment);Mobility status Pain - Right/Left: Left Pain - part of body: Shoulder     Time: 1421-1445 PT Time Calculation (min) (ACUTE ONLY): 24 min  Charges:  $Therapeutic Exercise: 8-22 mins $Therapeutic Activity: 8-22 mins                     Roxanne Gates, PT, DPT    Roxanne Gates 06/27/2019, 2:48 PM

## 2019-06-28 NOTE — Progress Notes (Signed)
D: Pt alert and oriented.   A: Pt and daughter received discharge and medication education/information. Pt belongings were were gathered and taken with pt to include polar care, personal cane, and shoulder appliance.   R: Pt and daughter verbalized understanding of discharge and medication education/information.  Pt escorted to medical mall lobby via wheelchair by staff where family picked pt up.

## 2019-06-28 NOTE — Progress Notes (Signed)
Physical Therapy Treatment Patient Details Name: Christine Higgins MRN: [id number] DOB: 04-10-1956 Today's Date: 06/28/2019    History of Present Illness admitted for acute hospitalization s/p L reverse TSA (10/29/2), NWB, immobilized.    PT Comments    Co-treat today with OT for safety and to assess stair negotiation.    Pt in better spirits today and able to demonstrate improved gait pattern and stair negotiation with +2 assist for safety. She was able to walk 150 ft with cane, CGA and navigate 8 steps with use of hand rail and VC's for foot placement.   She reported decreased knee pain but still with LBP.    Pt receptive to education concerning TrA contraction to manage LBP, which pt reported at end of session. Heating pad placed on low back afterward.  Pt still with some impulsivity with use of cane and awareness when walking through doorways.  Education provided to pt concerning WB status of L UE and importance of fall prevention and proper use of cane.  She will continue to benefit from skilled PT with focus on strength, safe functional mobility and pain management.  Pt still appropriate for SNF placement due to fall risk.  Should she choose to return home, she will need close guard assistance for stair navigation and 24 fr supervision in home.  This has been discussed with the pt.  Follow Up Recommendations  SNF     Equipment Recommendations  3in1 (PT)    Recommendations for Other Services       Precautions / Restrictions Precautions Precautions: Shoulder Type of Shoulder Precautions: NWBing LUE, no shoulder ROM Shoulder Interventions: Shoulder sling/immobilizer;At all times;Off for dressing/bathing/exercises Precaution Booklet Issued: Yes (comment) Restrictions Weight Bearing Restrictions: Yes LUE Weight Bearing: Non weight bearing    Mobility  Bed Mobility Overal bed mobility: (In chair)             General bed mobility comments: N/A, in bathroom upon OT's arrival and  in recliner with PT at end of session  Transfers Overall transfer level: Needs assistance Equipment used: Rolling walker (2 wheeled) Transfers: Sit to/from Stand Sit to Stand: Min guard         General transfer comment: Able to rise from chair with min UE assistance, reminder to use cane for stability.  Ambulation/Gait Ambulation/Gait assistance: Min guard;+2 safety/equipment Gait Distance (Feet): 150 Feet Assistive device: Quad cane     Gait velocity interpretation: 1.31 - 2.62 ft/sec, indicative of limited community ambulator General Gait Details: Improved gait pattern today with less pain reported, step through gait pattern with low foot clearance.  Still requires reminders for safety awareness when moving through doorways to avoid contact with L shoulder.   Stairs   Stairs assistance: Min guard Stair Management: One rail Right Number of Stairs: 8 General stair comments: First attempt with improvement from last treatment, Vc's to place foot completely on step.  Pt requested a second time leading with R LE and appeared less stable this time.   Wheelchair Mobility    Modified Rankin (Stroke Patients Only)       Balance Overall balance assessment: Needs assistance Sitting-balance support: No upper extremity supported;Feet supported Sitting balance-Leahy Scale: Good     Standing balance support: Single extremity supported Standing balance-Leahy Scale: Fair                              Cognition Arousal/Alertness: Awake/alert Behavior During Therapy: WFL for tasks  assessed/performed Overall Cognitive Status: Within Functional Limits for tasks assessed                                 General Comments: PRN VC for safety and to guard LUE when walking through doorways      Exercises Other Exercises Other Exercises: Instruction in seated pelvic tilt with Tra contraction 2x10 to manage LBP. Other Exercises: pt instructed in shoulder  precautions, falls prevention, AE/DME, home/routines modifications, hemi techniques for dressing, positioning of LUE in bed and in chair, shoulder sling mgt, and polar care mgt to support recall and carryover    General Comments        Pertinent Vitals/Pain Pain Assessment: 0-10 Pain Score: 7  Pain Location: L shoulder and R hip/low back Pain Descriptors / Indicators: Aching;Constant Pain Intervention(s): Limited activity within patient's tolerance;Monitored during session;RN gave pain meds during session;Repositioned    Home Living                      Prior Function            PT Goals (current goals can now be found in the care plan section) Acute Rehab PT Goals Patient Stated Goal: pt now wishing to return home PT Goal Formulation: With patient Time For Goal Achievement: 07/12/19 Potential to Achieve Goals: Good Progress towards PT goals: Progressing toward goals    Frequency    7X/week      PT Plan Current plan remains appropriate    Co-evaluation   Reason for Co-Treatment: For patient/therapist safety;To address functional/ADL transfers PT goals addressed during session: Mobility/safety with mobility;Proper use of DME;Strengthening/ROM OT goals addressed during session: ADL's and self-care;Proper use of Adaptive equipment and DME      AM-PAC PT "6 Clicks" Mobility   Outcome Measure  Help needed turning from your back to your side while in a flat bed without using bedrails?: A Little Help needed moving from lying on your back to sitting on the side of a flat bed without using bedrails?: A Little Help needed moving to and from a bed to a chair (including a wheelchair)?: A Little Help needed standing up from a chair using your arms (e.g., wheelchair or bedside chair)?: A Little Help needed to walk in hospital room?: A Little Help needed climbing 3-5 steps with a railing? : A Lot 6 Click Score: 17    End of Session Equipment Utilized During  Treatment: Gait belt Activity Tolerance: Patient tolerated treatment well Patient left: Other (comment);with call bell/phone within reach   PT Visit Diagnosis: Unsteadiness on feet (R26.81);Muscle weakness (generalized) (M62.81) Pain - Right/Left: Left Pain - part of body: Shoulder     Time: [id number] PT Time Calculation (min) (ACUTE ONLY): 27 min  Charges:  $Therapeutic Exercise: 23-37 mins                     Roxanne Gates, PT, DPT    Roxanne Gates 06/28/2019, 10:23 AM

## 2019-06-28 NOTE — Progress Notes (Signed)
Subjective: 4 Days Post-Op Procedure(s) (LRB): REVERSE SHOULDER ARTHROPLASTY (Left) Patient reports pain as mild to the left shoulder.  Patient is alert and oriented.  No drowsiness. Originally plan was for d/c to SNF, however patient feels comfortable going home. Negative for chest pain and shortness of breath Fever: no Gastrointestinal:Negative for nausea and vomiting  Objective: Vital signs in last 24 hours: Temp:  [98.2 F (36.8 C)-98.7 F (37.1 C)] 98.2 F (36.8 C) (11/02 0733) Pulse Rate:  [82-92] 82 (11/02 0733) Resp:  [16-18] 16 (11/02 0733) BP: (114-137)/(67-85) 114/67 (11/02 0733) SpO2:  [97 %-99 %] 98 % (11/02 0733)  Intake/Output from previous day:  Intake/Output Summary (Last 24 hours) at 06/28/2019 0743 Last data filed at 06/28/2019 0500 Gross per 24 hour  Intake 640 ml  Output -  Net 640 ml    Intake/Output this shift: No intake/output data recorded.  Labs: Recent Labs    06/26/19 0440  HGB 10.6*   Recent Labs    06/26/19 0440  WBC 6.1  RBC 3.89  HCT 31.3*  PLT 171   Recent Labs    06/26/19 0440 06/27/19 0703  NA 134* 131*  K 3.6 3.3*  CL 97* 93*  CO2 27 30  BUN 9 8  CREATININE 0.65 0.51  GLUCOSE 147* 149*  CALCIUM 9.1 9.1   No results for input(s): LABPT, INR in the last 72 hours.   EXAM General - Patient is Alert, Appropriate and Oriented  Lungs clear to auscultation.  No wheeze, rub or ronchi. Extremity - ABD soft Intact pulses distally Dorsiflexion/Plantar flexion intact Incision: dressing C/D/I No cellulitis present Compartment soft  Intact to light touch to the left arm. Dressing/Incision - clean, dry, no drainage Motor Function - intact, moving foot and toes well on exam.   Past Medical History:  Diagnosis Date  . Anxiety   . Asthma   . Breast mass    LEFT x 3 months per pt and palpated by physician  . Cervical disc disease   . Chronic pain syndrome   . Degenerative disc disease, lumbar   . Depression   .  Fibromyalgia   . Fibromyalgia   . GERD (gastroesophageal reflux disease)   . Glaucoma   . Graves disease   . Hemorrhoids   . Hyperlipidemia   . Hypertension   . Thyroid disease     Assessment/Plan: 4 Days Post-Op Procedure(s) (LRB): REVERSE SHOULDER ARTHROPLASTY (Left) Active Problems:   Status post reverse arthroplasty of shoulder, left  Estimated body mass index is 36.26 kg/m as calculated from the following:   Height as of this encounter: 5\' 11"  (1.803 m).   Weight as of this encounter: 117.9 kg. Up with therapy   Patient has had a BM. Up with therapy today. Based on performance today, will plan for either d/c home with HHPT and a home health aide or SNF. Patient is unable to take aspirin, will plan on d/c with Eliquis for blood clot prevention.  DVT Prophylaxis - Lovenox, Foot Pumps and TED hose Non-weightbearing to the left arm.  Raquel Rubbie Goostree, PA-C [facility] 06/28/2019, 7:43 AM

## 2019-06-28 NOTE — Plan of Care (Signed)
  Problem: Education: Goal: Knowledge of the prescribed therapeutic regimen will improve Outcome: Progressing Goal: Understanding of activity limitations/precautions following surgery will improve Outcome: Progressing Goal: Individualized Educational Video(s) Outcome: Progressing   Problem: Activity: Goal: Ability to tolerate increased activity will improve Outcome: Progressing   Problem: Pain Management: Goal: Pain level will decrease with appropriate interventions Outcome: Progressing   Problem: Education: Goal: Knowledge of General Education information will improve Description: Including pain rating scale, medication(s)/side effects and non-pharmacologic comfort measures Outcome: Progressing   Problem: Health Behavior/Discharge Planning: Goal: Ability to manage health-related needs will improve Outcome: Progressing   Problem: Clinical Measurements: Goal: Ability to maintain clinical measurements within normal limits will improve Outcome: Progressing Goal: Will remain free from infection Outcome: Progressing Goal: Diagnostic test results will improve Outcome: Progressing Goal: Respiratory complications will improve Outcome: Progressing Goal: Cardiovascular complication will be avoided Outcome: Progressing   Problem: Activity: Goal: Risk for activity intolerance will decrease Outcome: Progressing   Problem: Elimination: Goal: Will not experience complications related to bowel motility Outcome: Progressing Goal: Will not experience complications related to urinary retention Outcome: Progressing   Problem: Pain Managment: Goal: General experience of comfort will improve Outcome: Progressing

## 2019-06-28 NOTE — Care Management Important Message (Signed)
Important Message  Patient Details  Name: Christine Higgins MRN: [id number] Date of Birth: Jan 02, 1956   Medicare Important Message Given:  Yes     [name] 06/28/2019, 12:02 PM

## 2019-06-28 NOTE — Progress Notes (Signed)
Occupational Therapy Treatment Patient Details Name: Christine Higgins MRN: [id number] DOB: June 28, 1956 Today's Date: 06/28/2019    History of present illness admitted for acute hospitalization s/p L reverse TSA (10/29/2), NWB, immobilized.   OT comments  Pt seen for OT/PT co-tx this am. Pt alert and eager to participate. Pt in bathroom up OT's arrival and requesting a hospital gown. With gown retrieved, pt amb w/ CGA and her cane back to the EOB. Pt demo'd improved mobility this date. Pt instructed in sling mgt, polar care mgt, hemi techniques for dressing, LUE positioning for sleep and in chair, falls prevention strategies. Pt reports she is eager to return home now instead of rehab. Pt reports that her daughter who is an Therapist, sports is going to stay with her for 3-4 days and then she has a neighbor who can help PRN. She has Meals on Wheels and the pt reported that her insurance is going to send her a box of food. PT entered session for functional mobility training. Case manager notified. With +2 for safety, pt ambulated with cane from room to rehab gym and negotiated stairs with CGA and a +2 for safety. Pt continues to benefit from skilled OT services. Continue to recommend follow up per surgeon. Pt may benefit from follow up OT upon discharge.   Follow Up Recommendations  Follow surgeon's recommendation for DC plan and follow-up therapies    Equipment Recommendations  3 in 1 bedside commode    Recommendations for Other Services      Precautions / Restrictions Precautions Precautions: Shoulder Type of Shoulder Precautions: NWBing LUE, no shoulder ROM Shoulder Interventions: Shoulder sling/immobilizer;At all times;Off for dressing/bathing/exercises Precaution Booklet Issued: Yes (comment) Restrictions Weight Bearing Restrictions: Yes LUE Weight Bearing: Non weight bearing       Mobility Bed Mobility               General bed mobility comments: N/A, in bathroom upon OT's arrival and in  recliner with PT at end of session  Transfers Overall transfer level: Needs assistance Equipment used: Straight cane Transfers: Sit to/from Stand Sit to Stand: Min guard              Balance Overall balance assessment: Needs assistance Sitting-balance support: No upper extremity supported;Feet supported Sitting balance-Leahy Scale: Good     Standing balance support: Single extremity supported Standing balance-Leahy Scale: Fair                             ADL either performed or assessed with clinical judgement   ADL Overall ADL's : Needs assistance/impaired Eating/Feeding: Modified independent   Grooming: Sitting;Modified independent   Upper Body Bathing: Sitting;Moderate assistance   Lower Body Bathing: Sit to/from stand;Moderate assistance;Minimal assistance   Upper Body Dressing : Sitting;Moderate assistance   Lower Body Dressing: Sit to/from stand;Moderate assistance;Minimal assistance   Toilet Transfer: Ambulation;Min guard   Toileting- Clothing Manipulation and Hygiene: Modified independent       Functional mobility during ADLs: Min guard;+2 for safety/equipment;Cane       Vision Baseline Vision/History: Wears glasses Wears Glasses: Reading only Patient Visual Report: No change from baseline     Perception     Praxis      Cognition Arousal/Alertness: Awake/alert Behavior During Therapy: WFL for tasks assessed/performed Overall Cognitive Status: Within Functional Limits for tasks assessed  General Comments: PRN VC for safety and to guard LUE when walking through doorways        Exercises Other Exercises Other Exercises: pt instructed in shoulder precautions, falls prevention, AE/DME, home/routines modifications, hemi techniques for dressing, positioning of LUE in bed and in chair, shoulder sling mgt, and polar care mgt to support recall and carryover   Shoulder Instructions        General Comments      Pertinent Vitals/ Pain       Pain Assessment: 0-10 Pain Score: 7  Pain Location: L shoulder and R hip/low back Pain Descriptors / Indicators: Aching;Constant Pain Intervention(s): Limited activity within patient's tolerance;Monitored during session;RN gave pain meds during session;Repositioned  Home Living                                          Prior Functioning/Environment              Frequency  Min 2X/week        Progress Toward Goals  OT Goals(current goals can now be found in the care plan section)  Progress towards OT goals: Progressing toward goals  Acute Rehab OT Goals Patient Stated Goal: pt now wishing to return home OT Goal Formulation: With patient Time For Goal Achievement: 07/09/19 Potential to Achieve Goals: Good  Plan Discharge plan remains appropriate;Frequency remains appropriate    Co-evaluation    PT/OT/SLP Co-Evaluation/Treatment: Yes Reason for Co-Treatment: For patient/therapist safety;To address functional/ADL transfers PT goals addressed during session: Mobility/safety with mobility;Proper use of DME;Balance OT goals addressed during session: ADL's and self-care;Proper use of Adaptive equipment and DME      AM-PAC OT "6 Clicks" Daily Activity     Outcome Measure   Help from another person eating meals?: None Help from another person taking care of personal grooming?: None Help from another person toileting, which includes using toliet, bedpan, or urinal?: A Little Help from another person bathing (including washing, rinsing, drying)?: A Lot Help from another person to put on and taking off regular upper body clothing?: A Lot Help from another person to put on and taking off regular lower body clothing?: A Lot 6 Click Score: 17    End of Session Equipment Utilized During Treatment: Gait belt  OT Visit Diagnosis: Other abnormalities of gait and mobility (R26.89);History of falling  (Z91.81);Pain Pain - Right/Left: Left Pain - part of body: Shoulder   Activity Tolerance Patient tolerated treatment well   Patient Left in chair;with call bell/phone within reach;Other (comment)(PT in room for further treatment)   Nurse Communication          Time: [phone] OT Time Calculation (min): 47 min  Charges: OT General Charges $OT Visit: 1 Visit OT Treatments $Self Care/Home Management : 23-37 mins  Jeni Salles, MPH, MS, OTR/L ascom [phone] 06/28/19, 9:52 AM

## 2019-06-28 NOTE — TOC Transition Note (Signed)
Transition of Care [facility]) - CM/SW Discharge Note   Patient Details  Name: Christine Higgins MRN: [id number] Date of Birth: 1956-07-24  Transition of Care [facility]) CM/SW Contact:  Su Hilt, RN Phone Number: 06/28/2019, 10:20 AM   Clinical Narrative:    Patient to DC home today with [facility], I notified Helene Kelp with [facility]. The patient states that she has a BSC at home and does not need any Equipment She has meals on wheels at home Her daughter will stay with her for a while and her neighbor will also be helping. She is set up with have PT, OT and aide She can afford her medications and is up to date with her PCP She has transportation already set up with the bus/ Lucianne Lei and her daughter to go to Dr appointments and to DC home  Final next level of care: [place] Barriers to Discharge: Barriers Resolved   Patient Goals and CMS Choice Patient states their goals for this hospitalization and ongoing recovery are:: Pain control. CMS Medicare.gov Compare Post Acute Care list provided to:: Patient Choice offered to / list presented to : Patient  Discharge Placement                       Discharge Plan and Services In-house Referral: Clinical Social Work   Post Acute Care Choice: [place]          DME Arranged: N/A         HH Arranged: PT, OT, Nurse's Aide [place] Agency: [facility] [employer] [facility] [employer] Date [place]: 06/24/19 Time [place]: [place] Representative spoke with at [place]: [place] ([place]) Interventions     Readmission Risk Interventions No flowsheet data found.

## 2019-06-30 DIAGNOSIS — M810 Age-related osteoporosis without current pathological fracture: Secondary | ICD-10-CM | POA: Diagnosis not present

## 2019-06-30 DIAGNOSIS — G894 Chronic pain syndrome: Secondary | ICD-10-CM | POA: Diagnosis not present

## 2019-06-30 DIAGNOSIS — I10 Essential (primary) hypertension: Secondary | ICD-10-CM | POA: Diagnosis not present

## 2019-06-30 DIAGNOSIS — E119 Type 2 diabetes mellitus without complications: Secondary | ICD-10-CM | POA: Diagnosis not present

## 2019-06-30 DIAGNOSIS — M797 Fibromyalgia: Secondary | ICD-10-CM | POA: Diagnosis not present

## 2019-06-30 DIAGNOSIS — M170 Bilateral primary osteoarthritis of knee: Secondary | ICD-10-CM | POA: Diagnosis not present

## 2019-06-30 DIAGNOSIS — M5417 Radiculopathy, lumbosacral region: Secondary | ICD-10-CM | POA: Diagnosis not present

## 2019-06-30 DIAGNOSIS — Z471 Aftercare following joint replacement surgery: Secondary | ICD-10-CM | POA: Diagnosis not present

## 2019-06-30 DIAGNOSIS — I700 Atherosclerosis of aorta: Secondary | ICD-10-CM | POA: Diagnosis not present

## 2019-07-05 DIAGNOSIS — I10 Essential (primary) hypertension: Secondary | ICD-10-CM | POA: Diagnosis not present

## 2019-07-05 DIAGNOSIS — M810 Age-related osteoporosis without current pathological fracture: Secondary | ICD-10-CM | POA: Diagnosis not present

## 2019-07-05 DIAGNOSIS — Z471 Aftercare following joint replacement surgery: Secondary | ICD-10-CM | POA: Diagnosis not present

## 2019-07-05 DIAGNOSIS — G894 Chronic pain syndrome: Secondary | ICD-10-CM | POA: Diagnosis not present

## 2019-07-05 DIAGNOSIS — M170 Bilateral primary osteoarthritis of knee: Secondary | ICD-10-CM | POA: Diagnosis not present

## 2019-07-05 DIAGNOSIS — M5417 Radiculopathy, lumbosacral region: Secondary | ICD-10-CM | POA: Diagnosis not present

## 2019-07-05 DIAGNOSIS — M797 Fibromyalgia: Secondary | ICD-10-CM | POA: Diagnosis not present

## 2019-07-05 DIAGNOSIS — I700 Atherosclerosis of aorta: Secondary | ICD-10-CM | POA: Diagnosis not present

## 2019-07-05 DIAGNOSIS — E119 Type 2 diabetes mellitus without complications: Secondary | ICD-10-CM | POA: Diagnosis not present

## 2019-07-06 DIAGNOSIS — E119 Type 2 diabetes mellitus without complications: Secondary | ICD-10-CM | POA: Diagnosis not present

## 2019-07-06 DIAGNOSIS — I700 Atherosclerosis of aorta: Secondary | ICD-10-CM | POA: Diagnosis not present

## 2019-07-06 DIAGNOSIS — M170 Bilateral primary osteoarthritis of knee: Secondary | ICD-10-CM | POA: Diagnosis not present

## 2019-07-06 DIAGNOSIS — G894 Chronic pain syndrome: Secondary | ICD-10-CM | POA: Diagnosis not present

## 2019-07-06 DIAGNOSIS — M810 Age-related osteoporosis without current pathological fracture: Secondary | ICD-10-CM | POA: Diagnosis not present

## 2019-07-06 DIAGNOSIS — M5417 Radiculopathy, lumbosacral region: Secondary | ICD-10-CM | POA: Diagnosis not present

## 2019-07-06 DIAGNOSIS — Z471 Aftercare following joint replacement surgery: Secondary | ICD-10-CM | POA: Diagnosis not present

## 2019-07-06 DIAGNOSIS — I10 Essential (primary) hypertension: Secondary | ICD-10-CM | POA: Diagnosis not present

## 2019-07-06 DIAGNOSIS — M797 Fibromyalgia: Secondary | ICD-10-CM | POA: Diagnosis not present

## 2019-07-07 DIAGNOSIS — M170 Bilateral primary osteoarthritis of knee: Secondary | ICD-10-CM | POA: Diagnosis not present

## 2019-07-07 DIAGNOSIS — Z471 Aftercare following joint replacement surgery: Secondary | ICD-10-CM | POA: Diagnosis not present

## 2019-07-07 DIAGNOSIS — M810 Age-related osteoporosis without current pathological fracture: Secondary | ICD-10-CM | POA: Diagnosis not present

## 2019-07-07 DIAGNOSIS — M797 Fibromyalgia: Secondary | ICD-10-CM | POA: Diagnosis not present

## 2019-07-07 DIAGNOSIS — M5417 Radiculopathy, lumbosacral region: Secondary | ICD-10-CM | POA: Diagnosis not present

## 2019-07-07 DIAGNOSIS — G894 Chronic pain syndrome: Secondary | ICD-10-CM | POA: Diagnosis not present

## 2019-07-07 DIAGNOSIS — I10 Essential (primary) hypertension: Secondary | ICD-10-CM | POA: Diagnosis not present

## 2019-07-07 DIAGNOSIS — E119 Type 2 diabetes mellitus without complications: Secondary | ICD-10-CM | POA: Diagnosis not present

## 2019-07-07 DIAGNOSIS — I700 Atherosclerosis of aorta: Secondary | ICD-10-CM | POA: Diagnosis not present

## 2019-07-08 DIAGNOSIS — Z471 Aftercare following joint replacement surgery: Secondary | ICD-10-CM | POA: Diagnosis not present

## 2019-07-08 DIAGNOSIS — M797 Fibromyalgia: Secondary | ICD-10-CM | POA: Diagnosis not present

## 2019-07-08 DIAGNOSIS — E119 Type 2 diabetes mellitus without complications: Secondary | ICD-10-CM | POA: Diagnosis not present

## 2019-07-08 DIAGNOSIS — I10 Essential (primary) hypertension: Secondary | ICD-10-CM | POA: Diagnosis not present

## 2019-07-08 DIAGNOSIS — M170 Bilateral primary osteoarthritis of knee: Secondary | ICD-10-CM | POA: Diagnosis not present

## 2019-07-08 DIAGNOSIS — M810 Age-related osteoporosis without current pathological fracture: Secondary | ICD-10-CM | POA: Diagnosis not present

## 2019-07-08 DIAGNOSIS — G894 Chronic pain syndrome: Secondary | ICD-10-CM | POA: Diagnosis not present

## 2019-07-08 DIAGNOSIS — I700 Atherosclerosis of aorta: Secondary | ICD-10-CM | POA: Diagnosis not present

## 2019-07-08 DIAGNOSIS — M5417 Radiculopathy, lumbosacral region: Secondary | ICD-10-CM | POA: Diagnosis not present

## 2019-07-09 DIAGNOSIS — M25512 Pain in left shoulder: Secondary | ICD-10-CM | POA: Diagnosis not present

## 2019-07-09 DIAGNOSIS — Z86010 Personal history of colonic polyps: Secondary | ICD-10-CM | POA: Insufficient documentation

## 2019-07-09 DIAGNOSIS — K581 Irritable bowel syndrome with constipation: Secondary | ICD-10-CM | POA: Diagnosis not present

## 2019-07-09 DIAGNOSIS — Z96612 Presence of left artificial shoulder joint: Secondary | ICD-10-CM | POA: Diagnosis not present

## 2019-07-09 DIAGNOSIS — G8929 Other chronic pain: Secondary | ICD-10-CM | POA: Diagnosis not present

## 2019-07-09 DIAGNOSIS — M25612 Stiffness of left shoulder, not elsewhere classified: Secondary | ICD-10-CM | POA: Diagnosis not present

## 2019-07-09 DIAGNOSIS — K2100 Gastro-esophageal reflux disease with esophagitis, without bleeding: Secondary | ICD-10-CM | POA: Diagnosis not present

## 2019-07-09 DIAGNOSIS — M6281 Muscle weakness (generalized): Secondary | ICD-10-CM | POA: Diagnosis not present

## 2019-07-13 ENCOUNTER — Ambulatory Visit: Payer: Medicare HMO | Admitting: Speech Pathology

## 2019-07-14 DIAGNOSIS — J310 Chronic rhinitis: Secondary | ICD-10-CM | POA: Diagnosis not present

## 2019-07-14 DIAGNOSIS — R0600 Dyspnea, unspecified: Secondary | ICD-10-CM | POA: Diagnosis not present

## 2019-07-14 DIAGNOSIS — Z96612 Presence of left artificial shoulder joint: Secondary | ICD-10-CM | POA: Diagnosis not present

## 2019-07-14 DIAGNOSIS — M25512 Pain in left shoulder: Secondary | ICD-10-CM | POA: Diagnosis not present

## 2019-07-14 DIAGNOSIS — J449 Chronic obstructive pulmonary disease, unspecified: Secondary | ICD-10-CM | POA: Diagnosis not present

## 2019-07-14 DIAGNOSIS — L508 Other urticaria: Secondary | ICD-10-CM | POA: Diagnosis not present

## 2019-07-14 DIAGNOSIS — M8949 Other hypertrophic osteoarthropathy, multiple sites: Secondary | ICD-10-CM | POA: Diagnosis not present

## 2019-07-20 DIAGNOSIS — Z96612 Presence of left artificial shoulder joint: Secondary | ICD-10-CM | POA: Diagnosis not present

## 2019-07-20 DIAGNOSIS — M25512 Pain in left shoulder: Secondary | ICD-10-CM | POA: Diagnosis not present

## 2019-07-21 ENCOUNTER — Encounter: Payer: Self-pay | Admitting: Student in an Organized Health Care Education/Training Program

## 2019-07-25 DIAGNOSIS — Z20828 Contact with and (suspected) exposure to other viral communicable diseases: Secondary | ICD-10-CM | POA: Diagnosis not present

## 2019-07-26 ENCOUNTER — Other Ambulatory Visit: Payer: Self-pay

## 2019-07-26 ENCOUNTER — Encounter: Payer: Self-pay | Admitting: Student in an Organized Health Care Education/Training Program

## 2019-07-26 ENCOUNTER — Ambulatory Visit
Payer: Medicare HMO | Attending: Student in an Organized Health Care Education/Training Program | Admitting: Student in an Organized Health Care Education/Training Program

## 2019-07-26 DIAGNOSIS — M5137 Other intervertebral disc degeneration, lumbosacral region: Secondary | ICD-10-CM

## 2019-07-26 DIAGNOSIS — M5416 Radiculopathy, lumbar region: Secondary | ICD-10-CM

## 2019-07-26 DIAGNOSIS — M545 Low back pain, unspecified: Secondary | ICD-10-CM

## 2019-07-26 DIAGNOSIS — Z981 Arthrodesis status: Secondary | ICD-10-CM

## 2019-07-26 DIAGNOSIS — M51379 Other intervertebral disc degeneration, lumbosacral region without mention of lumbar back pain or lower extremity pain: Secondary | ICD-10-CM

## 2019-07-26 DIAGNOSIS — Z9889 Other specified postprocedural states: Secondary | ICD-10-CM | POA: Diagnosis not present

## 2019-07-26 DIAGNOSIS — M797 Fibromyalgia: Secondary | ICD-10-CM | POA: Diagnosis not present

## 2019-07-26 NOTE — Progress Notes (Signed)
Pain Management Virtual Encounter Note - Virtual Visit via [place] (real-time audio visits between healthcare provider and patient).   Patient's Phone No. & Preferred Pharmacy:  [phone] (home); [phone] (mobile); (Preferred) [phone] [email or web address]  [id number] [name], [place] [address] [place] [address] Phone: [phone] Fax: [phone]    Pre-screening note:  Our staff contacted Ms. Denby and offered her an "in person", "face-to-face" appointment versus a telephone encounter. She indicated preferring the telephone encounter, at this time.   Reason for Virtual Visit: COVID-19*  Social distancing based on CDC and AMA recommendations.   I contacted Walker Shadow on 07/26/2019 via video conference.      I clearly identified myself as [name], MD. I verified that I was speaking with the correct person using two identifiers (Name: PARMINDER CERICOLA, and date of birth: 1956-05-21).  Advanced Informed Consent I sought verbal advanced consent from Walker Shadow for virtual visit interactions. I informed Ms. Ozier of possible security and privacy concerns, risks, and limitations associated with providing "not-in-person" medical evaluation and management services. I also informed Ms. Pea of the availability of "in-person" appointments. Finally, I informed her that there would be a charge for the virtual visit and that she could be  personally, fully or partially, financially responsible for it. Ms. Schellhase expressed understanding and agreed to proceed.   Historic Elements   Ms. RMANI BLEY is a 63 y.o. year old, female patient evaluated today after her last encounter by our practice on 06/15/2019. Ms. Wahls  has a past medical history of Anxiety, Asthma, Breast mass, Cervical disc disease, Chronic pain syndrome, Degenerative disc disease, lumbar, Depression, Fibromyalgia, Fibromyalgia, GERD (gastroesophageal reflux  disease), Glaucoma, Graves disease, Hemorrhoids, Hyperlipidemia, Hypertension, and Thyroid disease. She also  has a past surgical history that includes carpel tunn (Right); Hand surgery (Right); carpel tunnel (Left); Cesarean section; Shoulder surgery (Right); Back surgery; Neck surgery; Total hip arthroplasty (Right); Excision Morton's neuroma (Left, 02/05/2017); Colonoscopy; Joint replacement; Esophagogastroduodenoscopy (egd) with propofol (N/A, 06/26/2017); Knee Arthroplasty (Left, 08/11/2017); Knee arthroscopy (Right, 07/02/2018); and Reverse shoulder arthroplasty (Left, 06/24/2019). Ms. Odell has a current medication list which includes the following prescription(s): acetaminophen, vitamin c, atorvastatin, budesonide-formoterol, calcium carbonate, vitamin d3, clobetasol cream, clonazepam, clotrimazole-betamethasone, coq-10, vitamin b-12, cyclobenzaprine, diphenhydramine, duloxetine, duloxetine, esomeprazole, gabapentin, hydralazine, hydroxyzine, linaclotide, losartan-hydrochlorothiazide, menthol (topical analgesic), methimazole, multiple vitamins-minerals, naphazoline-pheniramine, prenatal mv-min-fe fum-fa-dha, probiotic product, propranolol, propylene glycol, sodium chloride, sucralfate, tramadol, trazodone, turmeric, vitamin e, apixaban, ciprofloxacin, and oxycodone. She  reports that she quit smoking about 26 years ago. She has never used smokeless tobacco. She reports previous drug use. Drugs: Marijuana and Cocaine. She reports that she does not drink alcohol. Ms. Vancourt is allergic to cephalexin; ibuprofen; shellfish allergy; and aspirin.   HPI  Today, she is being contacted for a post-procedure assessment.  Patient is status post left reverse arthroplasty of shoulder on 06/24/2019.  She states that she is recovering well from the surgery.  She is working with physical therapy to help with her shoulder range of motion.  In regards to her lumbar radicular pain, patient states that her last injection was  the most beneficial when compared to her previous 2.  She states that for the first 3 days she had 100% pain relief and then gradual return of pain thereafter.  Patient has been responsive to lumbar epidural steroid injections but we discussed limiting these to no more than 3 a year.  Patient is in agreement  with this plan.  I will see patient back in approximately 3 to 4 months.   Evaluation of last interventional procedure  06/14/2019 Procedure:  Type: Therapeutic Inter-Laminar Epidural Steroid Injection  #3  Region: Lumbar Level: L5-S1 Level. Laterality: Right       Sedation: Please see nurses note for DOS. When no sedatives are used, the analgesic levels obtained are directly associated to the effectiveness of the local anesthetics. However, when sedation is provided, the level of analgesia obtained during the initial 1 hour following the intervention, is believed to be the result of a combination of factors. These factors may include, but are not limited to: 1. The effectiveness of the local anesthetics used. 2. The effects of the analgesic(s) and/or anxiolytic(s) used. 3. The degree of discomfort experienced by the patient at the time of the procedure. 4. The patients ability and reliability in recalling and recording the events. 5. The presence and influence of possible secondary gains and/or psychosocial factors. Reported result: Relief experienced during the 1st hour after the procedure: 100 % (Ultra-Short Term Relief)            Interpretative annotation: Clinically appropriate result. Analgesia during this period is likely to be Local Anesthetic and/or IV Sedative (Analgesic/Anxiolytic) related.          Effects of local anesthetic: The analgesic effects attained during this period are directly associated to the localized infiltration of local anesthetics and therefore cary significant diagnostic value as to the etiological location, or anatomical origin, of the pain. Expected duration  of relief is directly dependent on the pharmacodynamics of the local anesthetic used. Long-acting (4-6 hours) anesthetics used.  Reported result: Relief during the next 4 to 6 hour after the procedure: 100 % (Short-Term Relief)            Interpretative annotation: Clinically appropriate result. Analgesia during this period is likely to be Local Anesthetic-related.          Long-term benefit: Defined as the period of time past the expected duration of local anesthetics (1 hour for short-acting and 4-6 hours for long-acting). With the possible exception of prolonged sympathetic blockade from the local anesthetics, benefits during this period are typically attributed to, or associated with, other factors such as analgesic sensory neuropraxia, antiinflammatory effects, or beneficial biochemical changes provided by agents other than the local anesthetics.  Reported result: Extended relief following procedure: 100 %(lasting 3 days then gradually came back.) (Long-Term Relief)            Interpretative annotation: Clinically appropriate result. Good relief. No permanent benefit expected. Inflammation plays a part in the etiology to the pain.           Laboratory Chemistry Profile (12 mo)  Renal: 06/27/2019: BUN 8; Creatinine, Ser 0.51  Lab Results  Component Value Date   GFRAA >60 06/27/2019   GFRNONAA >60 06/27/2019   Hepatic: No results found for requested labs within last 8760 hours. Lab Results  Component Value Date   AST 27 08/18/2017   ALT 23 08/18/2017   Other: No results found for requested labs within last 8760 hours. Note: Above Lab results reviewed.  Imaging  DG Chest Port 1 View CLINICAL DATA:  Post LEFT shoulder replacement, now with shortness of breath; past history of hypertension, breast cancer, asthma, former smoker  EXAM: PORTABLE CHEST 1 VIEW  COMPARISON:  Portable exam 1113 hours compared to 04/26/2017  FINDINGS: Borderline enlargement of cardiac  silhouette.  Mediastinal contours and pulmonary vascularity normal.  Mild bibasilar atelectasis.  Lungs otherwise clear.  No infiltrate, pleural effusion or pneumothorax.  Bones demineralized with evidence of a new LEFT reverse shoulder arthroplasty.  Prior cervical spine fusion.  IMPRESSION: Bibasilar atelectasis.  Electronically Signed   By: Lavonia Dana M.D.   On: 06/25/2019 11:53   Assessment  The primary encounter diagnosis was Lumbar radiculopathy. Diagnoses of History of lumbar laminectomy for spinal cord decompression (L3-L4, 2015), Back pain at L4-L5 level, DDD (degenerative disc disease), lumbosacral, S/P cervical spinal fusion, and Fibromyalgia were also pertinent to this visit.  Plan of Care  I am having Jaleesa A. Javier Glazier maintain her traZODone, methimazole, DULoxetine, atorvastatin, vitamin C, diphenhydrAMINE, DULoxetine, linaclotide, losartan-hydrochlorothiazide, VITAMIN E PO, Probiotic Product (PROBIOTIC PO), Multiple Vitamins-Minerals (ALIVE WOMENS ENERGY PO), calcium carbonate, Vitamin D3, Vitamin B-12, (Menthol, Topical Analgesic, (ICY HOT EX)), Propylene Glycol, sodium chloride, clonazePAM, propranolol, hydrALAZINE, hydrOXYzine, TURMERIC PO, CoQ-10, Acetaminophen (TYLENOL ARTHRITIS EXT RELIEF PO), esomeprazole, clobetasol cream, Naphazoline-Pheniramine (OPCON-A OP), Prenatal MV-Min-Fe Fum-FA-DHA (PRENATAL 1 PO), gabapentin, cyclobenzaprine, budesonide-formoterol, clotrimazole-betamethasone, sucralfate, ciprofloxacin, oxyCODONE, traMADol, and apixaban. Follow-up plan:   Return in about 3 months (around 10/24/2019) for discuss repeat ESI (s/p 3 in 2020).     Status post right L5-S1 ESI 03/23/2019, 6 out of 8 cc injected, #2 on 04/21/19, 5 cc more concentrated, #3 on 06/14/2019: 6 cc    Recent Visits Date Type Provider Dept  06/14/19 Procedure visit [name], MD [facility]  05/26/19 Office Visit [name], MD [facility]  Showing recent  visits within past 90 days and meeting all other requirements   Today's Visits Date Type Provider Dept  07/26/19 Office Visit [name], MD [facility]  Showing today's visits and meeting all other requirements   Future Appointments No visits were found meeting these conditions.  Showing future appointments within next 90 days and meeting all other requirements   I discussed the assessment and treatment plan with the patient. The patient was provided an opportunity to ask questions and all were answered. The patient agreed with the plan and demonstrated an understanding of the instructions.  Patient advised to call back or seek an in-person evaluation if the symptoms or condition worsens.  Total duration of non-face-to-face encounter: 10 minutes.  Note by: [name], MD Date: 07/26/2019; Time: 9:12 AM  Note: This dictation was prepared with Dragon dictation. Any transcriptional errors that may result from this process are unintentional.  Disclaimer:  * Given the special circumstances of the COVID-19 pandemic, the federal government has announced that the Office for Civil Rights (OCR) will exercise its enforcement discretion and will not impose penalties on physicians using telehealth in the event of noncompliance with regulatory requirements under the [place] and [place] (HIPAA) in connection with the good faith provision of telehealth during the XX123456 national public health emergency. ([place])

## 2019-08-06 DIAGNOSIS — M75122 Complete rotator cuff tear or rupture of left shoulder, not specified as traumatic: Secondary | ICD-10-CM | POA: Diagnosis not present

## 2019-08-10 DIAGNOSIS — Z96612 Presence of left artificial shoulder joint: Secondary | ICD-10-CM | POA: Diagnosis not present

## 2019-08-10 DIAGNOSIS — M6281 Muscle weakness (generalized): Secondary | ICD-10-CM | POA: Diagnosis not present

## 2019-08-10 DIAGNOSIS — M25612 Stiffness of left shoulder, not elsewhere classified: Secondary | ICD-10-CM | POA: Diagnosis not present

## 2019-08-10 DIAGNOSIS — G8929 Other chronic pain: Secondary | ICD-10-CM | POA: Diagnosis not present

## 2019-08-10 DIAGNOSIS — M25512 Pain in left shoulder: Secondary | ICD-10-CM | POA: Diagnosis not present

## 2019-08-16 DIAGNOSIS — M25512 Pain in left shoulder: Secondary | ICD-10-CM | POA: Diagnosis not present

## 2019-08-16 DIAGNOSIS — M6281 Muscle weakness (generalized): Secondary | ICD-10-CM | POA: Diagnosis not present

## 2019-08-16 DIAGNOSIS — M25612 Stiffness of left shoulder, not elsewhere classified: Secondary | ICD-10-CM | POA: Diagnosis not present

## 2019-08-16 DIAGNOSIS — Z96612 Presence of left artificial shoulder joint: Secondary | ICD-10-CM | POA: Diagnosis not present

## 2019-08-16 DIAGNOSIS — G8929 Other chronic pain: Secondary | ICD-10-CM | POA: Diagnosis not present

## 2019-08-26 DIAGNOSIS — B356 Tinea cruris: Secondary | ICD-10-CM | POA: Diagnosis not present

## 2019-08-26 DIAGNOSIS — R300 Dysuria: Secondary | ICD-10-CM | POA: Diagnosis not present

## 2019-08-26 DIAGNOSIS — N3000 Acute cystitis without hematuria: Secondary | ICD-10-CM | POA: Diagnosis not present

## 2019-08-26 DIAGNOSIS — Z87891 Personal history of nicotine dependence: Secondary | ICD-10-CM | POA: Diagnosis not present

## 2019-08-26 DIAGNOSIS — J029 Acute pharyngitis, unspecified: Secondary | ICD-10-CM | POA: Diagnosis not present

## 2019-09-13 ENCOUNTER — Telehealth: Payer: Self-pay | Admitting: Student in an Organized Health Care Education/Training Program

## 2019-09-13 NOTE — Telephone Encounter (Signed)
Called patient with earlier Virtual Visit to discuss repeat epid. Scheduled for Feb 2 at 2:30, was first available. Patient states she is going to have to have some pain meds called in to do her until she can have procedure. Says [name] told her he would call in some tramadol. Please let patient know if this is possible

## 2019-09-13 NOTE — Telephone Encounter (Signed)
Patient called stating she is in a lot of pain and would like to come in sooner for her procedure appt. She is scheduled 10-21-19 per instructions.

## 2019-09-13 NOTE — Telephone Encounter (Signed)
Please call patient to schedule.

## 2019-09-13 NOTE — Telephone Encounter (Signed)
Did you want her to wait until Feb because she had 3 in 2020, the last being in Oct?  Or can she come sooner that Feb?

## 2019-09-13 NOTE — Telephone Encounter (Signed)
Did you discuss prescribing Tramadol? I dont see anything about this in the notes.

## 2019-09-14 ENCOUNTER — Telehealth: Payer: Self-pay | Admitting: Student in an Organized Health Care Education/Training Program

## 2019-09-14 DIAGNOSIS — L508 Other urticaria: Secondary | ICD-10-CM | POA: Diagnosis not present

## 2019-09-14 DIAGNOSIS — M5416 Radiculopathy, lumbar region: Secondary | ICD-10-CM

## 2019-09-14 DIAGNOSIS — H0589 Other disorders of orbit: Secondary | ICD-10-CM | POA: Diagnosis not present

## 2019-09-14 DIAGNOSIS — J301 Allergic rhinitis due to pollen: Secondary | ICD-10-CM | POA: Diagnosis not present

## 2019-09-14 DIAGNOSIS — L299 Pruritus, unspecified: Secondary | ICD-10-CM | POA: Diagnosis not present

## 2019-09-14 DIAGNOSIS — H04229 Epiphora due to insufficient drainage, unspecified lacrimal gland: Secondary | ICD-10-CM | POA: Diagnosis not present

## 2019-09-14 NOTE — Telephone Encounter (Signed)
Given increased pain, schedule patient for epidural next week. We can discuss meds at that visit too.  Orders Placed This Encounter  Procedures  . Lumbar Epidural Injection    Standing Status:   Future    Standing Expiration Date:   10/15/2019    Scheduling Instructions:     Procedure: Interlaminar Lumbar Epidural Steroid injection (LESI)            Laterality: Midline     Sedation: Patient's choice.     Timeframe: ASAA    Order Specific Question:   Where will this procedure be performed?    Answer:   [facility]   Up to pt if she wants sedation

## 2019-09-14 NOTE — Telephone Encounter (Signed)
All [employer] needs prior auth for everything except Trigger points

## 2019-09-14 NOTE — Telephone Encounter (Signed)
I scheduled patient 09-28-19 at 2:30  for a VV to discuss pain meds and her increased pain. Patient states she cannot wait until then, she needs something for pain today. Do you want me to schedule her for a procedure visit this week?

## 2019-09-16 DIAGNOSIS — L509 Urticaria, unspecified: Secondary | ICD-10-CM | POA: Diagnosis not present

## 2019-09-24 DIAGNOSIS — M1711 Unilateral primary osteoarthritis, right knee: Secondary | ICD-10-CM | POA: Diagnosis not present

## 2019-09-24 DIAGNOSIS — M170 Bilateral primary osteoarthritis of knee: Secondary | ICD-10-CM | POA: Diagnosis not present

## 2019-09-24 DIAGNOSIS — M1712 Unilateral primary osteoarthritis, left knee: Secondary | ICD-10-CM | POA: Diagnosis not present

## 2019-09-24 DIAGNOSIS — M25461 Effusion, right knee: Secondary | ICD-10-CM | POA: Diagnosis not present

## 2019-09-24 DIAGNOSIS — E0500 Thyrotoxicosis with diffuse goiter without thyrotoxic crisis or storm: Secondary | ICD-10-CM | POA: Diagnosis not present

## 2019-09-28 ENCOUNTER — Telehealth: Payer: Medicare HMO | Admitting: Student in an Organized Health Care Education/Training Program

## 2019-09-29 ENCOUNTER — Ambulatory Visit (HOSPITAL_BASED_OUTPATIENT_CLINIC_OR_DEPARTMENT_OTHER): Payer: Medicare HMO | Admitting: Student in an Organized Health Care Education/Training Program

## 2019-09-29 ENCOUNTER — Encounter: Payer: Self-pay | Admitting: Student in an Organized Health Care Education/Training Program

## 2019-09-29 ENCOUNTER — Other Ambulatory Visit: Payer: Self-pay

## 2019-09-29 ENCOUNTER — Ambulatory Visit
Admission: RE | Admit: 2019-09-29 | Discharge: 2019-09-29 | Disposition: A | Discharge status: Home / Self Care | Payer: Medicare HMO | Source: Ambulatory Visit | Attending: Student in an Organized Health Care Education/Training Program | Admitting: Student in an Organized Health Care Education/Training Program

## 2019-09-29 VITALS — BP 149/100 | HR 76 | Temp 97.2°F | Resp 18 | Ht 71.0 in | Wt 258.0 lb

## 2019-09-29 DIAGNOSIS — M5416 Radiculopathy, lumbar region: Secondary | ICD-10-CM

## 2019-09-29 DIAGNOSIS — G894 Chronic pain syndrome: Secondary | ICD-10-CM

## 2019-09-29 MED ORDER — ROPIVACAINE HCL 2 MG/ML IJ SOLN
2.0000 mL | Freq: Once | INTRAMUSCULAR | Status: AC
  Administered 2019-09-29: 2 mL via EPIDURAL

## 2019-09-29 MED ORDER — ROPIVACAINE HCL 2 MG/ML IJ SOLN
INTRAMUSCULAR | Status: AC
  Filled 2019-09-29: qty 10

## 2019-09-29 MED ORDER — LIDOCAINE HCL 2 % IJ SOLN
INTRAMUSCULAR | Status: AC
  Filled 2019-09-29: qty 20

## 2019-09-29 MED ORDER — LIDOCAINE HCL 2 % IJ SOLN
20.0000 mL | Freq: Once | INTRAMUSCULAR | Status: AC
  Administered 2019-09-29: 400 mg

## 2019-09-29 MED ORDER — DEXAMETHASONE SODIUM PHOSPHATE 10 MG/ML IJ SOLN
INTRAMUSCULAR | Status: AC
  Filled 2019-09-29: qty 1

## 2019-09-29 MED ORDER — SODIUM CHLORIDE 0.9% FLUSH
2.0000 mL | Freq: Once | INTRAVENOUS | Status: AC
  Administered 2019-09-29: 2 mL

## 2019-09-29 MED ORDER — DEXAMETHASONE SODIUM PHOSPHATE 10 MG/ML IJ SOLN
10.0000 mg | Freq: Once | INTRAMUSCULAR | Status: AC
  Administered 2019-09-29: 10 mg

## 2019-09-29 MED ORDER — SODIUM CHLORIDE (PF) 0.9 % IJ SOLN
INTRAMUSCULAR | Status: AC
  Filled 2019-09-29: qty 10

## 2019-09-29 MED ORDER — IOHEXOL 180 MG/ML  SOLN
10.0000 mL | Freq: Once | INTRAMUSCULAR | Status: AC
  Administered 2019-09-29: 10 mL via EPIDURAL
  Filled 2019-09-29: qty 20

## 2019-09-29 NOTE — Progress Notes (Signed)
Safety precautions to be maintained throughout the outpatient stay will include: orient to surroundings, keep bed in low position, maintain call bell within reach at all times, provide assistance with transfer out of bed and ambulation.  

## 2019-09-29 NOTE — Patient Instructions (Signed)
Pain Management Discharge Instructions  General Discharge Instructions :  If you need to reach your doctor call: Monday-Friday 8:00 am - 4:00 pm at [phone] or toll free [phone].  After clinic hours [phone] to have operator reach doctor.  Bring all of your medication bottles to all your appointments in the pain clinic.  To cancel or reschedule your appointment with Pain Management please remember to call 24 hours in advance to avoid a fee.  Refer to the educational materials which you have been given on: General Risks, I had my Procedure. Discharge Instructions, Post Sedation.  Post Procedure Instructions:  The drugs you were given will stay in your system until tomorrow, so for the next 24 hours you should not drive, make any legal decisions or drink any alcoholic beverages.  You may eat anything you prefer, but it is better to start with liquids then soups and crackers, and gradually work up to solid foods.  Please notify your doctor immediately if you have any unusual bleeding, trouble breathing or pain that is not related to your normal pain.  Depending on the type of procedure that was done, some parts of your body may feel week and/or numb.  This usually clears up by tonight or the next day.  Walk with the use of an assistive device or accompanied by an adult for the 24 hours.  You may use ice on the affected area for the first 24 hours.  Put ice in a Ziploc bag and cover with a towel and place against area 15 minutes on 15 minutes off.  You may switch to heat after 24 hours.Epidural Steroid Injection Patient Information  Description: The epidural space surrounds the nerves as they exit the spinal cord.  In some patients, the nerves can be compressed and inflamed by a bulging disc or a tight spinal canal (spinal stenosis).  By injecting steroids into the epidural space, we can bring irritated nerves into direct contact with a potentially helpful medication.  These  steroids act directly on the irritated nerves and can reduce swelling and inflammation which often leads to decreased pain.  Epidural steroids may be injected anywhere along the spine and from the neck to the low back depending upon the location of your pain.   After numbing the skin with local anesthetic (like Novocaine), a small needle is passed into the epidural space slowly.  You may experience a sensation of pressure while this is being done.  The entire block usually last less than 10 minutes.  Conditions which may be treated by epidural steroids:   Low back and leg pain  Neck and arm pain  Spinal stenosis  Post-laminectomy syndrome  Herpes zoster (shingles) pain  Pain from compression fractures  Preparation for the injection:  1. Do not eat any solid food or dairy products within 8 hours of your appointment.  2. You may drink clear liquids up to 3 hours before appointment.  Clear liquids include water, black coffee, juice or soda.  No milk or cream please. 3. You may take your regular medication, including pain medications, with a sip of water before your appointment  Diabetics should hold regular insulin (if taken separately) and take 1/2 normal NPH dos the morning of the procedure.  Carry some sugar containing items with you to your appointment. 4. A driver must accompany you and be prepared to drive you home after your procedure.  5. Bring all your current medications with your. 6. An IV may be inserted and   sedation may be given at the discretion of the physician.   7. A blood pressure cuff, EKG and other monitors will often be applied during the procedure.  Some patients may need to have extra oxygen administered for a short period. 8. You will be asked to provide medical information, including your allergies, prior to the procedure.  We must know immediately if you are taking blood thinners (like Coumadin/Warfarin)  Or if you are allergic to IV iodine contrast (dye). We must  know if you could possible be pregnant.  Possible side-effects:  Bleeding from needle site  Infection (rare, may require surgery)  Nerve injury (rare)  Numbness & tingling (temporary)  Difficulty urinating (rare, temporary)  Spinal headache ( a headache worse with upright posture)  Light -headedness (temporary)  Pain at injection site (several days)  Decreased blood pressure (temporary)  Weakness in arm/leg (temporary)  Pressure sensation in back/neck (temporary)  Call if you experience:  Fever/chills associated with headache or increased back/neck pain.  Headache worsened by an upright position.  New onset weakness or numbness of an extremity below the injection site  Hives or difficulty breathing (go to the emergency room)  Inflammation or drainage at the infection site  Severe back/neck pain  Any new symptoms which are concerning to you  Please note:  Although the local anesthetic injected can often make your back or neck feel good for several hours after the injection, the pain will likely return.  It takes 3-7 days for steroids to work in the epidural space.  You may not notice any pain relief for at least that one week.  If effective, we will often do a series of three injections spaced 3-6 weeks apart to maximally decrease your pain.  After the initial series, we generally will wait several months before considering a repeat injection of the same type.  If you have any questions, please call [phone] [facility] 

## 2019-09-29 NOTE — Progress Notes (Signed)
PROVIDER NOTE: Information contained herein reflects review and annotations entered in association with encounter. Interpretation of such information and data should be left to medically-trained personnel. Information provided to patient can be located elsewhere in the medical record under "Patient Instructions". Document created using STT-dictation technology, any transcriptional errors that may result from process are unintentional.    Patient: Christine Higgins  Service Category: Procedure  Provider: [name], MD  DOB: Oct 09, 1955  DOS: 09/29/2019  Location: [facility]  MRN: [id number]  Setting: Ambulatory - outpatient  Referring Provider: [name], MD  Type: Established Patient  Specialty: Interventional Pain Management  PCP: Kirk Ruths, MD   Primary Reason for Visit: Interventional Pain Management Treatment. CC:  Left low back and leg pain Procedure:          Anesthesia, Analgesia, Anxiolysis:  Type: Therapeutic Inter-Laminar Epidural Steroid Injection  #1 in 2021 ( s/p 3 in 2020) Region: Lumbar Level: L5-S1 Level. Laterality: Left-Sided         Type: Local Anesthesia  Local Anesthetic: Lidocaine 1-2%  Position: Prone with head of the table was raised to facilitate breathing.   Indications: 1. Lumbar radiculopathy   2. Chronic pain syndrome    Pain Score: Pre-procedure: 9 /10 Post-procedure: 8 /10   Pre-op Assessment:  Christine Higgins is a 64 y.o. (year old), female patient, seen today for interventional treatment. She  has a past surgical history that includes carpel tunn (Right); Hand surgery (Right); carpel tunnel (Left); Cesarean section; Shoulder surgery (Right); Back surgery; Neck surgery; Total hip arthroplasty (Right); Excision Morton's neuroma (Left, 02/05/2017); Colonoscopy; Joint replacement; Esophagogastroduodenoscopy (egd) with propofol (N/A, 06/26/2017); Knee Arthroplasty (Left, 08/11/2017); Knee arthroscopy (Right, 07/02/2018); and Reverse shoulder  arthroplasty (Left, 06/24/2019). Christine Higgins has a current medication list which includes the following prescription(s): acetaminophen, vitamin c, atorvastatin, budesonide-formoterol, calcium carbonate, vitamin d3, clobetasol cream, clonazepam, coq-10, vitamin b-12, cyclobenzaprine, diphenhydramine, duloxetine, duloxetine, esomeprazole, gabapentin, hydralazine, hydroxyzine, linaclotide, losartan-hydrochlorothiazide, menthol (topical analgesic), methimazole, multiple vitamins-minerals, naphazoline-pheniramine, prenatal mv-min-fe fum-fa-dha, probiotic product, propranolol, propylene glycol, sodium chloride, sucralfate, tramadol, trazodone, turmeric, vitamin e, apixaban, ciprofloxacin, clotrimazole-betamethasone, and oxycodone. Her primarily concern today is the Back Pain  Initial Vital Signs:  Pulse/HCG Rate: 76ECG Heart Rate: 74 Temp: (!) 97.2 F (36.2 C) Resp: 18 BP: 119/67 SpO2: 100 %  BMI: Estimated body mass index is 35.98 kg/m as calculated from the following:   Height as of this encounter: 5\' 11"  (1.803 m).   Weight as of this encounter: 258 lb (117 kg).  Risk Assessment: Allergies: Reviewed. She is allergic to cephalexin; ibuprofen; shellfish allergy; and aspirin.  Allergy Precautions: None required Coagulopathies: Reviewed. None identified.  Blood-thinner therapy: None at this time Active Infection(s): Reviewed. None identified. Christine Higgins is afebrile  Site Confirmation: Christine Higgins was asked to confirm the procedure and laterality before marking the site Procedure checklist: Completed Consent: Before the procedure and under the influence of no sedative(s), amnesic(s), or anxiolytics, the patient was informed of the treatment options, risks and possible complications. To fulfill our ethical and legal obligations, as recommended by the American Medical Association's Code of Ethics, I have informed the patient of my clinical impression; the nature and purpose of the treatment or procedure;  the risks, benefits, and possible complications of the intervention; the alternatives, including doing nothing; the risk(s) and benefit(s) of the alternative treatment(s) or procedure(s); and the risk(s) and benefit(s) of doing nothing. The patient was provided information about the general risks and possible complications associated with  the procedure. These may include, but are not limited to: failure to achieve desired goals, infection, bleeding, organ or nerve damage, allergic reactions, paralysis, and death. In addition, the patient was informed of those risks and complications associated to Spine-related procedures, such as failure to decrease pain; infection (i.e.: Meningitis, epidural or intraspinal abscess); bleeding (i.e.: epidural hematoma, subarachnoid hemorrhage, or any other type of intraspinal or peri-dural bleeding); organ or nerve damage (i.e.: Any type of peripheral nerve, nerve root, or spinal cord injury) with subsequent damage to sensory, motor, and/or autonomic systems, resulting in permanent pain, numbness, and/or weakness of one or several areas of the body; allergic reactions; (i.e.: anaphylactic reaction); and/or death. Furthermore, the patient was informed of those risks and complications associated with the medications. These include, but are not limited to: allergic reactions (i.e.: anaphylactic or anaphylactoid reaction(s)); adrenal axis suppression; blood sugar elevation that in diabetics may result in ketoacidosis or comma; water retention that in patients with history of congestive heart failure may result in shortness of breath, pulmonary edema, and decompensation with resultant heart failure; weight gain; swelling or edema; medication-induced neural toxicity; particulate matter embolism and blood vessel occlusion with resultant organ, and/or nervous system infarction; and/or aseptic necrosis of one or more joints. Finally, the patient was informed that Medicine is not an exact  science; therefore, there is also the possibility of unforeseen or unpredictable risks and/or possible complications that may result in a catastrophic outcome. The patient indicated having understood very clearly. We have given the patient no guarantees and we have made no promises. Enough time was given to the patient to ask questions, all of which were answered to the patient's satisfaction. Christine Higgins has indicated that she wanted to continue with the procedure. Attestation: I, the ordering provider, attest that I have discussed with the patient the benefits, risks, side-effects, alternatives, likelihood of achieving goals, and potential problems during recovery for the procedure that I have provided informed consent. Date  Time: 09/29/2019  8:54 AM  Pre-Procedure Preparation:  Monitoring: As per clinic protocol. Respiration, ETCO2, SpO2, BP, heart rate and rhythm monitor placed and checked for adequate function Safety Precautions: Patient was assessed for positional comfort and pressure points before starting the procedure. Time-out: I initiated and conducted the "Time-out" before starting the procedure, as per protocol. The patient was asked to participate by confirming the accuracy of the "Time Out" information. Verification of the correct person, site, and procedure were performed and confirmed by me, the nursing staff, and the patient. "Time-out" conducted as per Joint Commission's Universal Protocol (UP.01.01.01). Time: 0931  Description of Procedure:          Target Area: The interlaminar space, initially targeting the lower laminar border of the superior vertebral body. Approach: Paramedial approach. Area Prepped: Entire Posterior Lumbar Region Prepping solution: DuraPrep (Iodine Povacrylex [0.7% available iodine] and Isopropyl Alcohol, 74% w/w) Safety Precautions: Aspiration looking for blood return was conducted prior to all injections. At no point did we inject any substances, as a needle  was being advanced. No attempts were made at seeking any paresthesias. Safe injection practices and needle disposal techniques used. Medications properly checked for expiration dates. SDV (single dose vial) medications used. Description of the Procedure: Protocol guidelines were followed. The procedure needle was introduced through the skin, ipsilateral to the reported pain, and advanced to the target area. Bone was contacted and the needle walked caudad, until the lamina was cleared. The epidural space was identified using "loss-of-resistance technique" with 2-3 ml of  PF-NaCl (0.9% NSS), in a 5cc LOR glass syringe.  Vitals:   09/29/19 0858 09/29/19 0929 09/29/19 0934 09/29/19 0938  BP: 119/67 (!) 141/99 (!) 140/93 (!) 149/100  Pulse: 76     Resp:  18 16 18   Temp: (!) 97.2 F (36.2 C)     SpO2: 100% 99% 99% 100%  Weight: 258 lb (117 kg)     Height: 5\' 11"  (1.803 m)       Start Time: 0931 hrs. End Time: 0937 hrs.  Materials:  Needle(s) Type: Epidural needle Gauge: 17G Length: 5-in Medication(s): Please see orders for medications and dosing details. 5 cc solution made of 2 cc of preservative-free saline, 2 cc of 0.2% ropivacaine, 1 cc of Decadron 10 mg/cc. Imaging Guidance (Spinal):          Type of Imaging Technique: Fluoroscopy Guidance (Spinal) Indication(s): Assistance in needle guidance and placement for procedures requiring needle placement in or near specific anatomical locations not easily accessible without such assistance. Exposure Time: Please see nurses notes. Contrast: Before injecting any contrast, we confirmed that the patient did not have an allergy to iodine, shellfish, or radiological contrast. Once satisfactory needle placement was completed at the desired level, radiological contrast was injected. Contrast injected under live fluoroscopy. No contrast complications. See chart for type and volume of contrast used. Fluoroscopic Guidance: I was personally present during the  use of fluoroscopy. "Tunnel Vision Technique" used to obtain the best possible view of the target area. Parallax error corrected before commencing the procedure. "Direction-depth-direction" technique used to introduce the needle under continuous pulsed fluoroscopy. Once target was reached, antero-posterior, oblique, and lateral fluoroscopic projection used confirm needle placement in all planes. Images permanently stored in EMR. Interpretation: I personally interpreted the imaging intraoperatively. Adequate needle placement confirmed in multiple planes. Appropriate spread of contrast into desired area was observed. No evidence of afferent or efferent intravascular uptake. No intrathecal or subarachnoid spread observed. Permanent images saved into the patient's record.  Antibiotic Prophylaxis:   Anti-infectives (From admission, onward)   None     Indication(s): None identified  Post-operative Assessment:  Post-procedure Vital Signs:  Pulse/HCG Rate: 7675 Temp: (!) 97.2 F (36.2 C) Resp: 18 BP: (!) 149/100 SpO2: 100 %  EBL: None  Complications: No immediate post-treatment complications observed by team, or reported by patient.  Note: The patient tolerated the entire procedure well. A repeat set of vitals were taken after the procedure and the patient was kept under observation following institutional policy, for this type of procedure. Post-procedural neurological assessment was performed, showing return to baseline, prior to discharge. The patient was provided with post-procedure discharge instructions, including a section on how to identify potential problems. Should any problems arise concerning this procedure, the patient was given instructions to immediately contact us, at any time, without hesitation. In any case, we plan to contact the patient by telephone for a follow-up status report regarding this interventional procedure.  Comments:  No additional relevant information.  Plan of  Care  Orders:  Orders Placed This Encounter  Procedures  . [facility] PA[facility] C-ARM 1-60 MIN NO REPORT    Intraoperative interpretation by procedural physician at [facility].    Standing Status:   Standing    Number of Occurrences:   1    Order Specific Question:   Reason for exam:    Answer:   Assistance in needle guidance and placement for procedures requiring needle placement in or near specific anatomical locations not easily accessible  without such assistance.  . Compliance Drug Analysis, Ur    Volume: 30 ml(s). Minimum 3 ml of urine is needed. Document temperature of fresh sample. Indications: Long term (current) use of opiate analgesic (Z79.891) Test#: [id number] (Comprehensive Profile)   Medications ordered for procedure: Meds ordered this encounter  Medications  . iohexol (OMNIPAQUE) 180 MG/ML injection 10 mL    Must be Myelogram-compatible. If not available, you may substitute with a water-soluble, non-ionic, hypoallergenic, myelogram-compatible radiological contrast medium.  Marland Kitchen lidocaine (XYLOCAINE) 2 % (with pres) injection 400 mg  . ropivacaine (PF) 2 mg/mL (0.2%) (NAROPIN) injection 2 mL  . sodium chloride flush (NS) 0.9 % injection 2 mL  . dexamethasone (DECADRON) injection 10 mg   Medications administered: We administered iohexol, lidocaine, ropivacaine (PF) 2 mg/mL (0.2%), sodium chloride flush, and dexamethasone.  See the medical record for exact dosing, route, and time of administration.  Follow-up plan:   Return in about 4 weeks (around 10/27/2019) for Post Procedure Evaluation, virtual.      Status post right L5-S1 ESI 03/23/2019, 6 out of 8 cc injected, #2 on 04/21/19, 5 cc more concentrated, #3 on 06/14/2019: 6 cc, #4 09/29/19 left L5/S1 IL     Recent Visits Date Type Provider Dept  07/26/19 Office Visit [name], MD [facility]  Showing recent visits within past 90 days and meeting all other requirements   Today's Visits Date Type Provider  Dept  09/29/19 Procedure visit [name], MD [facility]  Showing today's visits and meeting all other requirements   Future Appointments Date Type Provider Dept  10/27/19 Appointment [name], MD [facility]  Showing future appointments within next 90 days and meeting all other requirements   Disposition: Discharge home  Discharge (Date  Time): 09/29/2019; 0945 hrs.   Primary Care Physician: Kirk Ruths, MD Location: [facility] Note by: [name], MD Date: 09/29/2019; Time: 9:50 AM  Disclaimer:  Medicine is not an exact science. The only guarantee in medicine is that nothing is guaranteed. It is important to note that the decision to proceed with this intervention was based on the information collected from the patient. The Data and conclusions were drawn from the patient's questionnaire, the interview, and the physical examination. Because the information was provided in large part by the patient, it cannot be guaranteed that it has not been purposely or unconsciously manipulated. Every effort has been made to obtain as much relevant data as possible for this evaluation. It is important to note that the conclusions that lead to this procedure are derived in large part from the available data. Always take into account that the treatment will also be dependent on availability of resources and existing treatment guidelines, considered by other Pain Management Practitioners as being common knowledge and practice, at the time of the intervention. For Medico-Legal purposes, it is also important to point out that variation in procedural techniques and pharmacological choices are the acceptable norm. The indications, contraindications, technique, and results of the above procedure should only be interpreted and judged by a Board-Certified Interventional Pain Specialist with extensive familiarity and expertise in the same exact procedure  and technique.

## 2019-09-30 ENCOUNTER — Telehealth: Payer: Self-pay

## 2019-09-30 NOTE — Telephone Encounter (Signed)
Post procedure phone call.  Patient states she is doing well.  

## 2019-10-01 DIAGNOSIS — E0500 Thyrotoxicosis with diffuse goiter without thyrotoxic crisis or storm: Secondary | ICD-10-CM | POA: Diagnosis not present

## 2019-10-01 DIAGNOSIS — E042 Nontoxic multinodular goiter: Secondary | ICD-10-CM | POA: Diagnosis not present

## 2019-10-01 LAB — COMPLIANCE DRUG ANALYSIS, UR

## 2019-10-12 ENCOUNTER — Telehealth: Payer: Self-pay | Admitting: Student in an Organized Health Care Education/Training Program

## 2019-10-12 ENCOUNTER — Other Ambulatory Visit: Payer: Self-pay | Admitting: Student in an Organized Health Care Education/Training Program

## 2019-10-12 MED ORDER — TRAMADOL HCL 50 MG PO TABS: 50.0000 mg | ORAL_TABLET | Freq: Three times a day (TID) | ORAL | 0 refills | Status: DC | PRN

## 2019-10-12 NOTE — Telephone Encounter (Signed)
Dr Holley Raring, would you like to schedule a virtual visit to discuss this?

## 2019-10-12 NOTE — Progress Notes (Unsigned)
Tramadol rx sent in 50 mg TID prn.  Requested Prescriptions   Signed Prescriptions Disp Refills  . traMADol (ULTRAM) 50 MG tablet 90 tablet 0    Sig: Take 1 tablet (50 mg total) by mouth every 8 (eight) hours as needed for severe pain.

## 2019-10-12 NOTE — Telephone Encounter (Signed)
Patient called stating she is having pain again and that [name] told her he could call in some tramadol. Please call this patient and let her know if this is possible or what she needs to do.

## 2019-10-21 ENCOUNTER — Telehealth: Payer: Medicare HMO | Admitting: Student in an Organized Health Care Education/Training Program

## 2019-10-21 DIAGNOSIS — L509 Urticaria, unspecified: Secondary | ICD-10-CM | POA: Diagnosis not present

## 2019-10-27 ENCOUNTER — Ambulatory Visit
Payer: Medicare HMO | Attending: Student in an Organized Health Care Education/Training Program | Admitting: Student in an Organized Health Care Education/Training Program

## 2019-10-27 ENCOUNTER — Encounter: Payer: Self-pay | Admitting: Student in an Organized Health Care Education/Training Program

## 2019-10-27 ENCOUNTER — Other Ambulatory Visit: Payer: Self-pay

## 2019-10-27 DIAGNOSIS — G894 Chronic pain syndrome: Secondary | ICD-10-CM | POA: Diagnosis not present

## 2019-10-27 DIAGNOSIS — M5416 Radiculopathy, lumbar region: Secondary | ICD-10-CM

## 2019-10-27 DIAGNOSIS — M545 Low back pain, unspecified: Secondary | ICD-10-CM

## 2019-10-27 DIAGNOSIS — Z9889 Other specified postprocedural states: Secondary | ICD-10-CM

## 2019-10-27 DIAGNOSIS — M5137 Other intervertebral disc degeneration, lumbosacral region: Secondary | ICD-10-CM

## 2019-10-27 DIAGNOSIS — Z79891 Long term (current) use of opiate analgesic: Secondary | ICD-10-CM

## 2019-10-27 DIAGNOSIS — M51379 Other intervertebral disc degeneration, lumbosacral region without mention of lumbar back pain or lower extremity pain: Secondary | ICD-10-CM

## 2019-10-27 MED ORDER — TRAMADOL HCL 50 MG PO TABS: 50.0000 mg | ORAL_TABLET | Freq: Three times a day (TID) | ORAL | 2 refills | Status: AC | PRN

## 2019-10-27 NOTE — Progress Notes (Signed)
Patient: Christine Higgins  Service Category: E/M  Provider: [name], MD  DOB: 06-Sep-1955  DOS: 10/27/2019  Location: Office  MRN: [id number]  Setting: Ambulatory outpatient  Referring Provider: Kirk Ruths, MD  Type: Established Patient  Specialty: Interventional Pain Management  PCP: Kirk Ruths, MD  Location: Home  Delivery: TeleHealth     Virtual Encounter - Pain Management PROVIDER NOTE: Information contained herein reflects review and annotations entered in association with encounter. Interpretation of such information and data should be left to medically-trained personnel. Information provided to patient can be located elsewhere in the medical record under "Patient Instructions". Document created using STT-dictation technology, any transcriptional errors that may result from process are unintentional.    Contact & Pharmacy Preferred: [phone] Home: [phone] (home) Mobile: [phone] (mobile) E-mail: [email or web address]  [name]Phone: [phone]: [phone]  Pre-screening  Ms. SVentrescaoffered "in-person" vs "virtual" encounter. She indicated preferring virtual for this encounter.   Reason COVID-19*  Social distancing based on CDC and AMA recommendations.   I contacted SWalker Shadowon 10/27/2019 via telephone.      I clearly identified myself as BGillis Santa MD. I verified that I was speaking with the correct person using two identifiers (Name: Christine Higgins and date of birth: 7April 10, 1957.  This visit was completed via telephone due to the restrictions of the COVID-19 pandemic. All issues as above were discussed and addressed but no physical exam was performed. If it was felt that the patient should be evaluated in the office, they were directed there. The patient verbally consented to this visit. Patient was unable to complete an audio/visual visit due to  Technical difficulties and/or Lack of internet. Due to the catastrophic nature of the COVID-19 pandemic, this visit was done through audio contact only.  Location of the patient: home address (see Epic for details)  Location of the provider: office  Consent I sought verbal advanced consent from SWalker Shadowfor virtual visit interactions. I informed Ms. SHornbakerof possible security and privacy concerns, risks, and limitations associated with providing "not-in-person" medical evaluation and management services. I also informed Ms. SGillinghamof the availability of "in-person" appointments. Finally, I informed her that there would be a charge for the virtual visit and that she could be  personally, fully or partially, financially responsible for it. Ms. SBurdettexpressed understanding and agreed to proceed.   Historic Elements   Ms. Christine STARNis a 64y.o. year old, female patient evaluated today after her last contact with our practice on 10/12/2019. Ms. SMathena has a past medical history of Anxiety, Asthma, Breast mass, Cervical disc disease, Chronic pain syndrome, Degenerative disc disease, lumbar, Depression, Fibromyalgia, Fibromyalgia, GERD (gastroesophageal reflux disease), Glaucoma, Graves disease, Hemorrhoids, Hyperlipidemia, Hypertension, and Thyroid disease. She also  has a past surgical history that includes carpel tunn (Right); Hand surgery (Right); carpel tunnel (Left); Cesarean section; Shoulder surgery (Right); Back surgery; Neck surgery; Total hip arthroplasty (Right); Excision Morton's neuroma (Left, 02/05/2017); Colonoscopy; Joint replacement; Esophagogastroduodenoscopy (egd) with propofol (N/A, 06/26/2017); Knee Arthroplasty (Left, 08/11/2017); Knee arthroscopy (Right, 07/02/2018); and Reverse shoulder arthroplasty (Left, 06/24/2019). Ms. SMasellihas a current medication list which includes the following prescription(s): acetaminophen, vitamin c, atorvastatin, budesonide-formoterol, calcium  carbonate, vitamin d3, clobetasol cream, clonazepam, clotrimazole-betamethasone, coq-10, vitamin b-12, cyclobenzaprine, diphenhydramine, duloxetine, duloxetine, esomeprazole, gabapentin, hydralazine, hydroxyzine, linaclotide, losartan-hydrochlorothiazide, menthol (topical analgesic), methimazole, multiple vitamins-minerals, naphazoline-pheniramine, prenatal mv-min-fe fum-fa-dha,  probiotic product, propranolol, propylene glycol, sodium chloride, sucralfate, trazodone, turmeric, vitamin e, apixaban, ciprofloxacin, and [START ON 11/03/2019] tramadol. She  reports that she quit smoking about 27 years ago. She has never used smokeless tobacco. She reports previous drug use. Drugs: Marijuana and Cocaine. She reports that she does not drink alcohol. [name] is allergic to cephalexin; ibuprofen; shellfish allergy; and aspirin.   HPI  Today, she is being contacted for both, medication management and a post-procedure assessment.  Evaluation of last interventional procedure  10/12/2019 Procedure:   Type: Therapeutic Inter-Laminar Epidural Steroid Injection  #1 in 2021 ( s/p 3 in 2020) Region: Lumbar Level: L5-S1 Level. Laterality: Left-Sided         Sedation: Please see nurses note for DOS. When no sedatives are used, the analgesic levels obtained are directly associated to the effectiveness of the local anesthetics. However, when sedation is provided, the level of analgesia obtained during the initial 1 hour following the intervention, is believed to be the result of a combination of factors. These factors may include, but are not limited to: 1. The effectiveness of the local anesthetics used. 2. The effects of the analgesic(s) and/or anxiolytic(s) used. 3. The degree of discomfort experienced by the patient at the time of the procedure. 4. The patients ability and reliability in recalling and recording the events. 5. The presence and influence of possible secondary gains and/or psychosocial  factors. Reported result: Relief experienced during the 1st hour after the procedure: 100%   (Ultra-Short Term Relief)            Interpretative annotation: Clinically appropriate result. Analgesia during this period is likely to be Local Anesthetic and/or IV Sedative (Analgesic/Anxiolytic) related.          Effects of local anesthetic: The analgesic effects attained during this period are directly associated to the localized infiltration of local anesthetics and therefore cary significant diagnostic value as to the etiological location, or anatomical origin, of the pain. Expected duration of relief is directly dependent on the pharmacodynamics of the local anesthetic used. Long-acting (4-6 hours) anesthetics used.  Reported result: Relief during the next 4 to 6 hour after the procedure: 100%   (Short-Term Relief)            Interpretative annotation: Clinically appropriate result. Analgesia during this period is likely to be Local Anesthetic-related.          Long-term benefit: Defined as the period of time past the expected duration of local anesthetics (1 hour for short-acting and 4-6 hours for long-acting). With the possible exception of prolonged sympathetic blockade from the local anesthetics, benefits during this period are typically attributed to, or associated with, other factors such as analgesic sensory neuropraxia, antiinflammatory effects, or beneficial biochemical changes provided by agents other than the local anesthetics.  Reported result: Extended relief following procedure: 100% pain relief for 1 week then decreased to 60% pain relief for 2 weeks with gradual return of pain thereafter, now at back to baseline   (Long-Term Relief)            Interpretative annotation: Clinically appropriate result. Good relief. No permanent benefit expected. Inflammation plays a part in the etiology to the pain.          Pharmacotherapy Assessment  Analgesic: Tramadol 50 mg TID prn sent in  10/12/19  Monitoring: [place] PMP: PDMP reviewed during this encounter.       Pharmacotherapy: No side-effects or adverse reactions reported. Compliance: No problems identified. Effectiveness: Clinically acceptable.  Plan: Refer to "POC".  UDS:  Summary  Date Value Ref Range Status  09/29/2019 Note  Final    Comment:    ==================================================================== Compliance Drug Analysis, Ur ==================================================================== Test                             Result       Flag       Units Drug Present and Declared for Prescription Verification   7-aminoclonazepam              65           EXPECTED   ng/mg creat    7-aminoclonazepam is an expected metabolite of clonazepam. Source of    clonazepam is a scheduled prescription medication.   Tramadol                       >4310        EXPECTED   ng/mg creat   O-Desmethyltramadol            >4310        EXPECTED   ng/mg creat   N-Desmethyltramadol            3447         EXPECTED   ng/mg creat    Source of tramadol is a prescription medication. O-desmethyltramadol    and N-desmethyltramadol are expected metabolites of tramadol.   Gabapentin                     PRESENT      EXPECTED   Cyclobenzaprine                PRESENT      EXPECTED   Desmethylcyclobenzaprine       PRESENT      EXPECTED    Desmethylcyclobenzaprine is an expected metabolite of    cyclobenzaprine.   Duloxetine                     PRESENT      EXPECTED   Trazodone                      PRESENT      EXPECTED   1,3 chlorophenyl piperazine    PRESENT      EXPECTED    1,3-chlorophenyl piperazine is an expected metabolite of trazodone.   Diphenhydramine                PRESENT      EXPECTED   Propranolol                    PRESENT      EXPECTED Drug Absent but Declared for Prescription Verification   Oxycodone                      Not Detected UNEXPECTED ng/mg creat   Acetaminophen                  Not Detected  UNEXPECTED    Acetaminophen, as indicated in the declared medication list, is not    always detected even when used as directed.   Hydroxyzine                    Not Detected UNEXPECTED ==================================================================== Test  Result    Flag   Units      Ref Range   Creatinine              116              mg/dL      >=20 ==================================================================== Declared Medications:  The flagging and interpretation on this report are based on the  following declared medications.  Unexpected results may arise from  inaccuracies in the declared medications.  **Note: The testing scope of this panel includes these medications:  Clonazepam  Cyclobenzaprine (Flexeril)  Diphenhydramine (Benadryl)  Duloxetine (Cymbalta)  Gabapentin (Neurontin)  Hydroxyzine  Oxycodone (Roxicodone)  Propranolol (Inderal)  Tramadol (Ultram)  Trazodone (Desyrel)  **Note: The testing scope of this panel does not include small to  moderate amounts of these reported medications:  Acetaminophen  **Note: The testing scope of this panel does not include the  following reported medications:  Apixaban  Atorvastatin (Lipitor)  Betamethasone (Lotrisone)  Budesonide (Symbicort)  Calcium  Ciprofloxacin (Cipro)  Clobetasol  Clotrimazole (Lotrisone)  Esomeprazole (Nexium)  Eye Drops  Formoterol (Symbicort)  Hydralazine (Apresoline)  Hydrochlorothiazide (Hyzaar)  Iron  Losartan (Hyzaar)  Menthol  Multivitamin  Probiotic  Sodium Chloride  Sucralfate (Carafate)  Turmeric  Ubiquinone (CoQ10)  Vitamin B12  Vitamin C  Vitamin D3  Vitamin E ==================================================================== For clinical consultation, please call [phone]. ====================================================================    Laboratory Chemistry Profile   Renal Lab Results  Component Value Date   BUN 8 06/27/2019    CREATININE 0.51 06/27/2019   GFRAA >60 06/27/2019   GFRNONAA >60 06/27/2019    Hepatic Lab Results  Component Value Date   AST 27 08/18/2017   ALT 23 08/18/2017   ALBUMIN 3.5 08/18/2017   ALKPHOS 110 08/18/2017   LIPASE 27 12/14/2016    Electrolytes Lab Results  Component Value Date   NA 131 (L) 06/27/2019   K 3.3 (L) 06/27/2019   CL 93 (L) 06/27/2019   CALCIUM 9.1 06/27/2019   MG 1.9 08/27/2014    Bone No results found for: VD25OH, VD125OH2TOT, AP0141CV0, DT1438OI7, 25OHVITD1, 25OHVITD2, 25OHVITD3, TESTOFREE, TESTOSTERONE  Inflammation (CRP: Acute Phase) (ESR: Chronic Phase) Lab Results  Component Value Date   CRP <0.8 08/01/2017   ESRSEDRATE 11 08/01/2017      Note: Above Lab results reviewed.   Assessment  The primary encounter diagnosis was Lumbar radiculopathy. Diagnoses of Chronic pain syndrome, History of lumbar laminectomy for spinal cord decompression (L3-L4, 2015), Back pain at L4-L5 level, DDD (degenerative disc disease), lumbosacral, and Encounter for long-term opiate analgesic use were also pertinent to this visit.  Plan of Care   [name] has a current medication list which includes the following long-term medication(s): atorvastatin, budesonide-formoterol, calcium carbonate, clonazepam, diphenhydramine, duloxetine, duloxetine, gabapentin, hydralazine, linaclotide, losartan-hydrochlorothiazide, methimazole, propranolol, sodium chloride, trazodone, and apixaban.  1.  Refill tramadol as below. 2.  Patient states that she sustained the greatest benefit with her most recent lumbar epidural steroid injection.  Can repeat after April 1.  Discussed risks of long-term epidural steroid injection therapy with patient.  Would like to limit spinal steroid exposure to no more than 3-4 times a year, every 3 to 4 months lumbar epidural steroid injection as needed.  Pharmacotherapy (Medications Ordered): Meds ordered this encounter  Medications  . traMADol  (ULTRAM) 50 MG tablet    Sig: Take 1 tablet (50 mg total) by mouth every 8 (eight) hours as needed for severe pain.    Dispense:  90 tablet    Refill:  2    Fill one day early if pharmacy is closed on scheduled refill date. For chronic pain syndrome   Orders:  Orders Placed This Encounter  Procedures  . Lumbar Epidural Injection    Standing Status:   Standing    Number of Occurrences:   9    Standing Expiration Date:   04/28/2021    Scheduling Instructions:     Purpose: Palliative     Indication: Lower extremity pain/Sciatica unspecified side (M54.30).     Side: Midline     Level: TBD     Sedation: Patient's choice.     TIMEFRAME: PRN procedure. (Ms. Strathman will call when needed.) Anytime after April 1    Order Specific Question:   Where will this procedure be performed?    Answer:   [facility]   Follow-up plan:   Return if symptoms worsen or fail to improve.     Status post right L5-S1 ESI 03/23/2019, 6 out of 8 cc injected, #2 on 04/21/19, 5 cc more concentrated, #3 on 06/14/2019: 6 cc, #4 09/29/19 left L5/S1 IL      Recent Visits Date Type Provider Dept  09/29/19 Procedure visit [name], MD [facility]  Showing recent visits within past 90 days and meeting all other requirements   Today's Visits Date Type Provider Dept  10/27/19 Office Visit [name], MD [facility]  Showing today's visits and meeting all other requirements   Future Appointments No visits were found meeting these conditions.  Showing future appointments within next 90 days and meeting all other requirements   I discussed the assessment and treatment plan with the patient. The patient was provided an opportunity to ask questions and all were answered. The patient agreed with the plan and demonstrated an understanding of the instructions.  Patient advised to call back or seek an in-person evaluation if the symptoms or condition worsens.  Duration of encounter:  47mnutes.  Note by: BGillis Santa MD Date: 10/27/2019; Time: 1:57 PM

## 2019-10-28 DIAGNOSIS — N898 Other specified noninflammatory disorders of vagina: Secondary | ICD-10-CM | POA: Diagnosis not present

## 2019-10-28 DIAGNOSIS — Z1231 Encounter for screening mammogram for malignant neoplasm of breast: Secondary | ICD-10-CM | POA: Diagnosis not present

## 2019-10-28 DIAGNOSIS — Z01419 Encounter for gynecological examination (general) (routine) without abnormal findings: Secondary | ICD-10-CM | POA: Diagnosis not present

## 2019-10-28 DIAGNOSIS — Z124 Encounter for screening for malignant neoplasm of cervix: Secondary | ICD-10-CM | POA: Diagnosis not present

## 2019-10-28 DIAGNOSIS — R300 Dysuria: Secondary | ICD-10-CM | POA: Diagnosis not present

## 2019-10-28 DIAGNOSIS — Z1211 Encounter for screening for malignant neoplasm of colon: Secondary | ICD-10-CM | POA: Diagnosis not present

## 2019-11-16 DIAGNOSIS — Z1211 Encounter for screening for malignant neoplasm of colon: Secondary | ICD-10-CM | POA: Diagnosis not present

## 2019-11-29 ENCOUNTER — Ambulatory Visit: Admit: 2019-11-29 | Payer: Medicare HMO | Admitting: Internal Medicine

## 2019-11-29 SURGERY — COLONOSCOPY WITH PROPOFOL
Anesthesia: General

## 2019-12-08 ENCOUNTER — Ambulatory Visit: Payer: Medicare HMO | Admitting: Dermatology

## 2019-12-08 ENCOUNTER — Ambulatory Visit (HOSPITAL_BASED_OUTPATIENT_CLINIC_OR_DEPARTMENT_OTHER): Payer: Medicare HMO | Admitting: Student in an Organized Health Care Education/Training Program

## 2019-12-08 ENCOUNTER — Ambulatory Visit
Admission: RE | Admit: 2019-12-08 | Discharge: 2019-12-08 | Disposition: A | Discharge status: Home / Self Care | Payer: Medicare HMO | Source: Ambulatory Visit | Attending: Student in an Organized Health Care Education/Training Program | Admitting: Student in an Organized Health Care Education/Training Program

## 2019-12-08 ENCOUNTER — Other Ambulatory Visit: Payer: Self-pay

## 2019-12-08 ENCOUNTER — Encounter: Payer: Self-pay | Admitting: Student in an Organized Health Care Education/Training Program

## 2019-12-08 DIAGNOSIS — G894 Chronic pain syndrome: Secondary | ICD-10-CM | POA: Insufficient documentation

## 2019-12-08 DIAGNOSIS — M5416 Radiculopathy, lumbar region: Secondary | ICD-10-CM

## 2019-12-08 MED ORDER — ROPIVACAINE HCL 2 MG/ML IJ SOLN
2.0000 mL | Freq: Once | INTRAMUSCULAR | Status: AC
  Administered 2019-12-08: 2 mL via EPIDURAL

## 2019-12-08 MED ORDER — LIDOCAINE HCL 2 % IJ SOLN
INTRAMUSCULAR | Status: AC
  Filled 2019-12-08: qty 20

## 2019-12-08 MED ORDER — LIDOCAINE HCL 2 % IJ SOLN
20.0000 mL | Freq: Once | INTRAMUSCULAR | Status: AC
  Administered 2019-12-08: 400 mg

## 2019-12-08 MED ORDER — DEXAMETHASONE SODIUM PHOSPHATE 10 MG/ML IJ SOLN
INTRAMUSCULAR | Status: AC
  Filled 2019-12-08: qty 1

## 2019-12-08 MED ORDER — SODIUM CHLORIDE (PF) 0.9 % IJ SOLN
INTRAMUSCULAR | Status: AC
  Filled 2019-12-08: qty 10

## 2019-12-08 MED ORDER — SODIUM CHLORIDE 0.9% FLUSH
2.0000 mL | Freq: Once | INTRAVENOUS | Status: AC
  Administered 2019-12-08: 2 mL

## 2019-12-08 MED ORDER — IOHEXOL 180 MG/ML  SOLN
10.0000 mL | Freq: Once | INTRAMUSCULAR | Status: AC
  Administered 2019-12-08: 10 mL via EPIDURAL

## 2019-12-08 MED ORDER — ROPIVACAINE HCL 2 MG/ML IJ SOLN
INTRAMUSCULAR | Status: AC
  Filled 2019-12-08: qty 10

## 2019-12-08 MED ORDER — DEXAMETHASONE SODIUM PHOSPHATE 10 MG/ML IJ SOLN
10.0000 mg | Freq: Once | INTRAMUSCULAR | Status: AC
  Administered 2019-12-08: 10 mg

## 2019-12-08 MED ORDER — IOHEXOL 180 MG/ML  SOLN
INTRAMUSCULAR | Status: AC
  Filled 2019-12-08: qty 20

## 2019-12-08 NOTE — Progress Notes (Signed)
Safety precautions to be maintained throughout the outpatient stay will include: orient to surroundings, keep bed in low position, maintain call bell within reach at all times, provide assistance with transfer out of bed and ambulation.  

## 2019-12-08 NOTE — Patient Instructions (Signed)

## 2019-12-08 NOTE — Progress Notes (Signed)
PROVIDER NOTE: Information contained herein reflects review and annotations entered in association with encounter. Interpretation of such information and data should be left to medically-trained personnel. Information provided to patient can be located elsewhere in the medical record under "Patient Instructions". Document created using STT-dictation technology, any transcriptional errors that may result from process are unintentional.    Patient: Christine Higgins  Service Category: Procedure  Provider: [name], MD  DOB: 1956-08-22  DOS: 12/08/2019  Location: [facility]  MRN: [id number]  Setting: Ambulatory - outpatient  Referring Provider: [name], MD  Type: Established Patient  Specialty: Interventional Pain Management  PCP: Kirk Ruths, MD   Primary Reason for Visit: Interventional Pain Management Treatment. CC:  Left low back and leg pain Procedure:          Anesthesia, Analgesia, Anxiolysis:  Type: Therapeutic Inter-Laminar Epidural Steroid Injection  #2 in 2021 ( #1 09/29/19) Region: Lumbar Level: L5-S1 Level. Laterality: Left-Sided         Type: Local Anesthesia  Local Anesthetic: Lidocaine 1-2%  Position: Prone with head of the table was raised to facilitate breathing.   Indications: 1. Lumbar radiculopathy   2. Chronic pain syndrome    Pain Score: Pre-procedure: 9 /10 Post-procedure: 2 /10   Pre-op Assessment:  Christine Higgins is a 64 y.o. (year old), female patient, seen today for interventional treatment. She  has a past surgical history that includes carpel tunn (Right); Hand surgery (Right); carpel tunnel (Left); Cesarean section; Shoulder surgery (Right); Back surgery; Neck surgery; Total hip arthroplasty (Right); Excision Morton's neuroma (Left, 02/05/2017); Colonoscopy; Joint replacement; Esophagogastroduodenoscopy (egd) with propofol (N/A, 06/26/2017); Knee Arthroplasty (Left, 08/11/2017); Knee arthroscopy (Right, 07/02/2018); and Reverse shoulder  arthroplasty (Left, 06/24/2019). Christine Higgins has a current medication list which includes the following prescription(s): acetaminophen, vitamin c, atorvastatin, budesonide-formoterol, calcium carbonate, vitamin d3, ciprofloxacin, clobetasol cream, clonazepam, clotrimazole-betamethasone, coq-10, vitamin b-12, cyclobenzaprine, diphenhydramine, duloxetine, duloxetine, esomeprazole, gabapentin, hydralazine, hydroxyzine, linaclotide, losartan-hydrochlorothiazide, menthol (topical analgesic), methimazole, multiple vitamins-minerals, naphazoline-pheniramine, prenatal mv-min-fe fum-fa-dha, probiotic product, propranolol, propylene glycol, sodium chloride, sucralfate, tramadol, trazodone, turmeric, vitamin e, and apixaban. Her primarily concern today is the Back Pain (lower)  Initial Vital Signs:  Pulse/HCG Rate: 87ECG Heart Rate: 89 Temp: (!) 97.3 F (36.3 C) Resp: 16 BP: 117/75 SpO2: 99 %  BMI: Estimated body mass index is 35.57 kg/m as calculated from the following:   Height as of this encounter: 5\' 11"  (1.803 m).   Weight as of this encounter: 255 lb (115.7 kg).  Risk Assessment: Allergies: Reviewed. She is allergic to cephalexin; ibuprofen; shellfish allergy; and aspirin.  Allergy Precautions: None required Coagulopathies: Reviewed. None identified.  Blood-thinner therapy: None at this time Active Infection(s): Reviewed. None identified. Christine Higgins is afebrile  Site Confirmation: Christine Higgins was asked to confirm the procedure and laterality before marking the site Procedure checklist: Completed Consent: Before the procedure and under the influence of no sedative(s), amnesic(s), or anxiolytics, the patient was informed of the treatment options, risks and possible complications. To fulfill our ethical and legal obligations, as recommended by the American Medical Association's Code of Ethics, I have informed the patient of my clinical impression; the nature and purpose of the treatment or procedure; the  risks, benefits, and possible complications of the intervention; the alternatives, including doing nothing; the risk(s) and benefit(s) of the alternative treatment(s) or procedure(s); and the risk(s) and benefit(s) of doing nothing. The patient was provided information about the general risks and possible complications associated with the procedure.  These may include, but are not limited to: failure to achieve desired goals, infection, bleeding, organ or nerve damage, allergic reactions, paralysis, and death. In addition, the patient was informed of those risks and complications associated to Spine-related procedures, such as failure to decrease pain; infection (i.e.: Meningitis, epidural or intraspinal abscess); bleeding (i.e.: epidural hematoma, subarachnoid hemorrhage, or any other type of intraspinal or peri-dural bleeding); organ or nerve damage (i.e.: Any type of peripheral nerve, nerve root, or spinal cord injury) with subsequent damage to sensory, motor, and/or autonomic systems, resulting in permanent pain, numbness, and/or weakness of one or several areas of the body; allergic reactions; (i.e.: anaphylactic reaction); and/or death. Furthermore, the patient was informed of those risks and complications associated with the medications. These include, but are not limited to: allergic reactions (i.e.: anaphylactic or anaphylactoid reaction(s)); adrenal axis suppression; blood sugar elevation that in diabetics may result in ketoacidosis or comma; water retention that in patients with history of congestive heart failure may result in shortness of breath, pulmonary edema, and decompensation with resultant heart failure; weight gain; swelling or edema; medication-induced neural toxicity; particulate matter embolism and blood vessel occlusion with resultant organ, and/or nervous system infarction; and/or aseptic necrosis of one or more joints. Finally, the patient was informed that Medicine is not an exact  science; therefore, there is also the possibility of unforeseen or unpredictable risks and/or possible complications that may result in a catastrophic outcome. The patient indicated having understood very clearly. We have given the patient no guarantees and we have made no promises. Enough time was given to the patient to ask questions, all of which were answered to the patient's satisfaction. Christine Higgins has indicated that she wanted to continue with the procedure. Attestation: I, the ordering provider, attest that I have discussed with the patient the benefits, risks, side-effects, alternatives, likelihood of achieving goals, and potential problems during recovery for the procedure that I have provided informed consent. Date  Time: 12/08/2019  9:09 AM  Pre-Procedure Preparation:  Monitoring: As per clinic protocol. Respiration, ETCO2, SpO2, BP, heart rate and rhythm monitor placed and checked for adequate function Safety Precautions: Patient was assessed for positional comfort and pressure points before starting the procedure. Time-out: I initiated and conducted the "Time-out" before starting the procedure, as per protocol. The patient was asked to participate by confirming the accuracy of the "Time Out" information. Verification of the correct person, site, and procedure were performed and confirmed by me, the nursing staff, and the patient. "Time-out" conducted as per Joint Commission's Universal Protocol (UP.01.01.01). Time: [id number]  Description of Procedure:          Target Area: The interlaminar space, initially targeting the lower laminar border of the superior vertebral body. Approach: Paramedial approach. Area Prepped: Entire Posterior Lumbar Region Prepping solution: DuraPrep (Iodine Povacrylex [0.7% available iodine] and Isopropyl Alcohol, 74% w/w) Safety Precautions: Aspiration looking for blood return was conducted prior to all injections. At no point did we inject any substances, as a needle  was being advanced. No attempts were made at seeking any paresthesias. Safe injection practices and needle disposal techniques used. Medications properly checked for expiration dates. SDV (single dose vial) medications used. Description of the Procedure: Protocol guidelines were followed. The procedure needle was introduced through the skin, ipsilateral to the reported pain, and advanced to the target area. Bone was contacted and the needle walked caudad, until the lamina was cleared. The epidural space was identified using "loss-of-resistance technique" with 2-3 ml of PF-NaCl (0.9%  NSS), in a 5cc LOR glass syringe.  Vitals:   12/08/19 0935 12/08/19 0940 12/08/19 0944 12/08/19 0947  BP: (!) 141/80 (!) 130/95 136/88 128/85  Pulse:      Resp: (!) 25 16 16  (!) 25  Temp:      TempSrc:      SpO2: 97% 96% 97% 97%  Weight:      Height:        Start Time: 0942 hrs. End Time: 0946 hrs.  Materials:  Needle(s) Type: Epidural needle Gauge: 17G Length: 5-in Medication(s): Please see orders for medications and dosing details. 5 cc solution made of 2 cc of preservative-free saline, 2 cc of 0.2% ropivacaine, 1 cc of Decadron 10 mg/cc. Imaging Guidance (Spinal):          Type of Imaging Technique: Fluoroscopy Guidance (Spinal) Indication(s): Assistance in needle guidance and placement for procedures requiring needle placement in or near specific anatomical locations not easily accessible without such assistance. Exposure Time: Please see nurses notes. Contrast: Before injecting any contrast, we confirmed that the patient did not have an allergy to iodine, shellfish, or radiological contrast. Once satisfactory needle placement was completed at the desired level, radiological contrast was injected. Contrast injected under live fluoroscopy. No contrast complications. See chart for type and volume of contrast used. Fluoroscopic Guidance: I was personally present during the use of fluoroscopy. "Tunnel Vision  Technique" used to obtain the best possible view of the target area. Parallax error corrected before commencing the procedure. "Direction-depth-direction" technique used to introduce the needle under continuous pulsed fluoroscopy. Once target was reached, antero-posterior, oblique, and lateral fluoroscopic projection used confirm needle placement in all planes. Images permanently stored in EMR. Interpretation: I personally interpreted the imaging intraoperatively. Adequate needle placement confirmed in multiple planes. Appropriate spread of contrast into desired area was observed. No evidence of afferent or efferent intravascular uptake. No intrathecal or subarachnoid spread observed. Permanent images saved into the patient's record.  Antibiotic Prophylaxis:   Anti-infectives (From admission, onward)   None     Indication(s): None identified  Post-operative Assessment:  Post-procedure Vital Signs:  Pulse/HCG Rate: 8789 Temp: (!) 97.3 F (36.3 C) Resp: (!) 25 BP: 128/85 SpO2: 97 %  EBL: None  Complications: No immediate post-treatment complications observed by team, or reported by patient.  Note: The patient tolerated the entire procedure well. A repeat set of vitals were taken after the procedure and the patient was kept under observation following institutional policy, for this type of procedure. Post-procedural neurological assessment was performed, showing return to baseline, prior to discharge. The patient was provided with post-procedure discharge instructions, including a section on how to identify potential problems. Should any problems arise concerning this procedure, the patient was given instructions to immediately contact us, at any time, without hesitation. In any case, we plan to contact the patient by telephone for a follow-up status report regarding this interventional procedure.  Comments:  No additional relevant information.  Plan of Care  Orders:  Orders Placed This  Encounter  Procedures  . DG PAIN [facility] C-ARM 1-60 MIN NO REPORT    Intraoperative interpretation by procedural physician at [place].    Standing Status:   Standing    Number of Occurrences:   1    Order Specific Question:   Reason for exam:    Answer:   Assistance in needle guidance and placement for procedures requiring needle placement in or near specific anatomical locations not easily accessible without such assistance.  Medications ordered for procedure: Meds ordered this encounter  Medications  . iohexol (OMNIPAQUE) 180 MG/ML injection 10 mL    Must be Myelogram-compatible. If not available, you may substitute with a water-soluble, non-ionic, hypoallergenic, myelogram-compatible radiological contrast medium.  Marland Kitchen lidocaine (XYLOCAINE) 2 % (with pres) injection 400 mg  . ropivacaine (PF) 2 mg/mL (0.2%) (NAROPIN) injection 2 mL  . sodium chloride flush (NS) 0.9 % injection 2 mL  . dexamethasone (DECADRON) injection 10 mg   Medications administered: We administered iohexol, lidocaine, ropivacaine (PF) 2 mg/mL (0.2%), sodium chloride flush, and dexamethasone.  See the medical record for exact dosing, route, and time of administration.  Follow-up plan:   Return in about 8 weeks (around 02/02/2020) for Post Procedure Evaluation, virtual.      Status post right L5-S1 ESI 03/23/2019, 6 out of 8 cc injected, #2 on 04/21/19, 5 cc more concentrated, #3 on 06/14/2019: 6 cc, #4 09/29/19 left L5/S1 IL , #5 12/08/19    Recent Visits Date Type Provider Dept  10/27/19 Office Visit [name], MD [facility]  09/29/19 Procedure visit [name], MD [facility]  Showing recent visits within past 90 days and meeting all other requirements   Today's Visits Date Type Provider Dept  12/08/19 Procedure visit [name], MD [facility]  Showing today's visits and meeting all other requirements   Future Appointments Date Type Provider Dept  02/02/20  Appointment [name], MD [facility]  Showing future appointments within next 90 days and meeting all other requirements   Disposition: Discharge home  Discharge (Date  Time): 12/08/2019; 0952 hrs.   Primary Care Physician: Kirk Ruths, MD Location: [facility] Note by: [name], MD Date: 12/08/2019; Time: 9:57 AM  Disclaimer:  Medicine is not an exact science. The only guarantee in medicine is that nothing is guaranteed. It is important to note that the decision to proceed with this intervention was based on the information collected from the patient. The Data and conclusions were drawn from the patient's questionnaire, the interview, and the physical examination. Because the information was provided in large part by the patient, it cannot be guaranteed that it has not been purposely or unconsciously manipulated. Every effort has been made to obtain as much relevant data as possible for this evaluation. It is important to note that the conclusions that lead to this procedure are derived in large part from the available data. Always take into account that the treatment will also be dependent on availability of resources and existing treatment guidelines, considered by other Pain Management Practitioners as being common knowledge and practice, at the time of the intervention. For Medico-Legal purposes, it is also important to point out that variation in procedural techniques and pharmacological choices are the acceptable norm. The indications, contraindications, technique, and results of the above procedure should only be interpreted and judged by a Board-Certified Interventional Pain Specialist with extensive familiarity and expertise in the same exact procedure and technique.

## 2019-12-09 ENCOUNTER — Telehealth: Payer: Self-pay

## 2019-12-09 NOTE — Telephone Encounter (Signed)
Post procedure phone call. Patient states she is doing good.  

## 2019-12-16 ENCOUNTER — Ambulatory Visit (INDEPENDENT_AMBULATORY_CARE_PROVIDER_SITE_OTHER): Payer: Medicare HMO | Admitting: Dermatology

## 2019-12-16 ENCOUNTER — Other Ambulatory Visit: Payer: Self-pay

## 2019-12-16 ENCOUNTER — Other Ambulatory Visit: Payer: Self-pay | Admitting: Obstetrics and Gynecology

## 2019-12-16 DIAGNOSIS — Z87891 Personal history of nicotine dependence: Secondary | ICD-10-CM | POA: Diagnosis not present

## 2019-12-16 DIAGNOSIS — F325 Major depressive disorder, single episode, in full remission: Secondary | ICD-10-CM | POA: Diagnosis not present

## 2019-12-16 DIAGNOSIS — N390 Urinary tract infection, site not specified: Secondary | ICD-10-CM | POA: Diagnosis not present

## 2019-12-16 DIAGNOSIS — D239 Other benign neoplasm of skin, unspecified: Secondary | ICD-10-CM

## 2019-12-16 DIAGNOSIS — I700 Atherosclerosis of aorta: Secondary | ICD-10-CM | POA: Diagnosis not present

## 2019-12-16 DIAGNOSIS — L304 Erythema intertrigo: Secondary | ICD-10-CM | POA: Diagnosis not present

## 2019-12-16 DIAGNOSIS — I10 Essential (primary) hypertension: Secondary | ICD-10-CM | POA: Diagnosis not present

## 2019-12-16 DIAGNOSIS — L810 Postinflammatory hyperpigmentation: Secondary | ICD-10-CM | POA: Diagnosis not present

## 2019-12-16 DIAGNOSIS — Z1231 Encounter for screening mammogram for malignant neoplasm of breast: Secondary | ICD-10-CM

## 2019-12-16 DIAGNOSIS — Z6836 Body mass index (BMI) 36.0-36.9, adult: Secondary | ICD-10-CM | POA: Diagnosis not present

## 2019-12-16 DIAGNOSIS — R0602 Shortness of breath: Secondary | ICD-10-CM | POA: Diagnosis not present

## 2019-12-16 DIAGNOSIS — E119 Type 2 diabetes mellitus without complications: Secondary | ICD-10-CM | POA: Diagnosis not present

## 2019-12-16 DIAGNOSIS — R21 Rash and other nonspecific skin eruption: Secondary | ICD-10-CM

## 2019-12-16 MED ORDER — CLOBETASOL PROPIONATE 0.05 % EX OINT: TOPICAL_OINTMENT | CUTANEOUS | 2 refills | Status: DC

## 2019-12-16 MED ORDER — HYDROCORTISONE 2.5 % EX CREA: TOPICAL_CREAM | CUTANEOUS | 2 refills | Status: DC

## 2019-12-16 MED ORDER — KETOCONAZOLE 2 % EX CREA: TOPICAL_CREAM | CUTANEOUS | 11 refills | Status: DC

## 2019-12-16 MED ORDER — HYDROXYZINE HCL 10 MG PO TABS: ORAL_TABLET | ORAL | 1 refills | Status: DC

## 2019-12-16 MED ORDER — TRIAMCINOLONE ACETONIDE 0.1 % EX OINT: TOPICAL_OINTMENT | CUTANEOUS | 3 refills | Status: DC

## 2019-12-16 NOTE — Progress Notes (Signed)
Follow-Up Visit   Subjective  Christine Higgins is a 64 y.o. female who presents for the following: Follow-up (rash).  Patient here today to follow up on her rash on back and abdomen is using Lotrisone cream and ciclopirox cream. She states it is pretty much gone at this time and would like some refills of Hydroxyzine and her clobetasol ointment Patient has been taking multiple courses of Prednisone for the recurrent rash. She reports she has had a DEXA scan.   The following portions of the chart were reviewed this encounter and updated as appropriate:  Tobacco  Allergies  Meds  Problems  Med Hx  Surg Hx  Fam Hx      Review of Systems:  No other skin or systemic complaints except as noted in HPI or Assessment and Plan.  Objective  Well appearing patient in no apparent distress; mood and affect are within normal limits.  A focused examination was performed including chest, axillae, abdomen, back, arms, and buttocks. Relevant physical exam findings are noted in the Assessment and Plan.  Objective  Right Abdomen (side) - Upper: Hyperpigmented patches   Objective  Abdomen and back: Hyperpigmentation  Objective  abdomen and back: Scattered comedones, inflammatory papules, and pustules.  Not active at this time at abdomen or back Scaly hyperpigmented plaque at right elbow   Assessment & Plan  Erythema intertrigo Right Abdomen (side) - Upper  Start ketoconazole cream twice daily for rash under breasts #60g 11RF  Start Hydrocortisone 2.5% cream twice daily up to one week at a time for rash under breast #90g 2RF  Recommend OTC antifungal powder daily for prevention  Topical steroids (such as triamcinolone, fluocinolone, fluocinonide, mometasone, clobetasol, halobetasol, betamethasone, hydrocortisone) can cause thinning and lightening of the skin if they are used for too long in the same area. Your physician has selected the right strength medicine for your problem and area  affected on the body. Please use your medication only as directed by your physician to prevent side effects.     ketoconazole (NIZORAL) 2 % cream - Right Abdomen (side) - Upper  hydrocortisone 2.5 % cream - Right Abdomen (side) - Upper  Post-inflammatory hyperpigmentation Abdomen and back  Reassured, will resolve with time  Rash abdomen and back  Chronic, recurrent  Call when recurs so we can evaluate when active Call if she has any worsening  Continue Clobetasol ointment apply to AA's twice daily as needed for rash, #60g 3RF If not covered by Insurance will start [place] 0.1% ointment.  Start Triamcinolone 0.1% apply to AA's twice daily  #60g 3RF  Continue Hydroxyzine 10mg  can take up to 3 tablets (30mg  total) by mouth at bedtime as needed for itching #30 1RF. Counseled regarding risk of falls in those over 45 years old as well as risk of dementia with long-term use. Use lowest amount required for effect and return to clinic when rash recurs so we can appropriately identify and treat the rash to hopefully reduce or eliminate itch without long-term use of hydroxyzine.   Topical steroids (such as triamcinolone, fluocinolone, fluocinonide, mometasone, clobetasol, halobetasol, betamethasone, hydrocortisone) can cause thinning and lightening of the skin if they are used for too long in the same area. Your physician has selected the right strength medicine for your problem and area affected on the body. Please use your medication only as directed by your physician to prevent side effects.    clobetasol ointment (TEMOVATE) 0.05 % - abdomen and back  .Dermatofibroma - Firm  pink/brown papulenodule with dimple sign - Benign appearing - Call for any changes   Return 4 to 6 months, for Rash, sooner if rash is active then as soon as possible when rash is active.  IDonzetta Kohut, CMA, am acting as scribe for Forest Gleason, MD .  Documentation: I have reviewed the above documentation for  accuracy and completeness, and I agree with the above.  Forest Gleason, MD

## 2019-12-16 NOTE — Patient Instructions (Signed)
Topical steroids (such as triamcinolone, fluocinolone, fluocinonide, mometasone, clobetasol, halobetasol, betamethasone, hydrocortisone) can cause thinning and lightening of the skin if they are used for too long in the same area. Your physician has selected the right strength medicine for your problem and area affected on the body. Please use your medication only as directed by your physician to prevent side effects.   . 

## 2019-12-22 DIAGNOSIS — L4050 Arthropathic psoriasis, unspecified: Secondary | ICD-10-CM | POA: Diagnosis not present

## 2019-12-22 DIAGNOSIS — M8949 Other hypertrophic osteoarthropathy, multiple sites: Secondary | ICD-10-CM | POA: Diagnosis not present

## 2019-12-22 DIAGNOSIS — Z111 Encounter for screening for respiratory tuberculosis: Secondary | ICD-10-CM | POA: Diagnosis not present

## 2019-12-22 DIAGNOSIS — L409 Psoriasis, unspecified: Secondary | ICD-10-CM | POA: Insufficient documentation

## 2020-01-01 ENCOUNTER — Encounter: Payer: Self-pay | Admitting: Dermatology

## 2020-01-11 DIAGNOSIS — N6082 Other benign mammary dysplasias of left breast: Secondary | ICD-10-CM | POA: Diagnosis not present

## 2020-01-11 DIAGNOSIS — B354 Tinea corporis: Secondary | ICD-10-CM | POA: Diagnosis not present

## 2020-01-11 DIAGNOSIS — N6081 Other benign mammary dysplasias of right breast: Secondary | ICD-10-CM | POA: Diagnosis not present

## 2020-02-02 ENCOUNTER — Other Ambulatory Visit: Payer: Self-pay

## 2020-02-02 ENCOUNTER — Ambulatory Visit
Payer: Medicare HMO | Attending: Student in an Organized Health Care Education/Training Program | Admitting: Student in an Organized Health Care Education/Training Program

## 2020-02-02 ENCOUNTER — Encounter: Payer: Self-pay | Admitting: Student in an Organized Health Care Education/Training Program

## 2020-02-02 VITALS — BP 134/82 | HR 73 | Temp 98.2°F | Ht 71.0 in | Wt 240.0 lb

## 2020-02-02 DIAGNOSIS — M545 Low back pain, unspecified: Secondary | ICD-10-CM

## 2020-02-02 DIAGNOSIS — M797 Fibromyalgia: Secondary | ICD-10-CM | POA: Diagnosis not present

## 2020-02-02 DIAGNOSIS — G894 Chronic pain syndrome: Secondary | ICD-10-CM

## 2020-02-02 DIAGNOSIS — M5416 Radiculopathy, lumbar region: Secondary | ICD-10-CM

## 2020-02-02 DIAGNOSIS — Z9889 Other specified postprocedural states: Secondary | ICD-10-CM | POA: Diagnosis not present

## 2020-02-02 MED ORDER — TRAMADOL HCL 50 MG PO TABS: 50.0000 mg | ORAL_TABLET | Freq: Three times a day (TID) | ORAL | 2 refills | Status: DC | PRN

## 2020-02-02 NOTE — Progress Notes (Signed)
PROVIDER NOTE: Information contained herein reflects review and annotations entered in association with encounter. Interpretation of such information and data should be left to medically-trained personnel. Information provided to patient can be located elsewhere in the medical record under "Patient Instructions". Document created using STT-dictation technology, any transcriptional errors that may result from process are unintentional.    Patient: Christine Higgins  Service Category: E/M  Provider: [name], MD  DOB: [birth year]  DOS: 02/02/2020  Specialty: Interventional Pain Management  MRN: [id number]  Setting: Ambulatory outpatient  PCP: Christine Ruths, MD  Type: Established Patient    Referring Provider: Kirk Ruths, MD  Location: Office  Delivery: Face-to-face     HPI  Reason for encounter: Christine Higgins, a 64 y.o. year old female, is here today for evaluation and management of her Lumbar radiculopathy [M54.16]. [name] primary complain today is Back Pain Last encounter: Practice (12/09/2019). My last encounter with her was on 12/08/2019. Pertinent problems: Ms. Cheek has Back pain at L4-L5 level; DDD (degenerative disc disease), lumbosacral; Severe obesity (BMI 35.0-35.9 with comorbidity) (Christine Higgins); Primary osteoarthritis of right knee; Primary osteoarthritis of left knee; DM II (diabetes mellitus, type II), controlled (Christine Higgins); Chronic pain syndrome; Fibromyalgia; and Lumbar radiculopathy on their pertinent problem list. Pain Assessment: Severity of Chronic pain is reported as a 8 /10. Location: Back Right, Left, Lower/pain radiaties down left leg. Onset: More than a month ago. Quality: Aching, Burning, Throbbing, Sharp, Constant. Timing: Constant. Modifying factor(s): Meds and produres. Vitals:  height is '5\' 11"'$  (1.803 m) and weight is 240 lb (108.9 kg). Her temperature is 98.2 F (36.8 C). Her blood pressure is 134/82 and her pulse is 73. Her oxygen saturation is 99%.    Presents  today with low back pain that radiates to her left lateral thigh down her left leg in a dermatomal fashion.  She is status post L5-S1 epidural steroid injection on 12/08/2019 which provided her with 100% pain relief for approximately 3 weeks.  She is now experiencing gradual return of her pain.  We discussed repeating lumbar epidural steroid injection #3.  I cautioned the patient on her total steroid exposure for the year and that we should limit her spinal exposure to no more than 3-4 times a year depending upon her symptoms.  Patient endorsed understanding.  She also states that she tried to wean herself off the tramadol but noticed an increase in her pain and feels that it does help manage her pain more than she appreciates.  Refill tramadol as below, schedule for lumbar ESI.  Pharmacotherapy Assessment   Analgesic: 01/20/2020  1   10/27/2019  Tramadol Hcl 50 MG Tablet  90.00  30 Bi Lat   [id number]   Nor (2541)   2  15.00 MME  Medicare   [place]     Monitoring: [place] PMP: PDMP reviewed during this encounter.       Pharmacotherapy: No side-effects or adverse reactions reported. Compliance: No problems identified. Effectiveness: Clinically acceptable.  UDS:  Summary  Date Value Ref Range Status  09/29/2019 Note  Final    Comment:    ==================================================================== Compliance Drug Analysis, Ur ==================================================================== Test                             Result       Flag       Units Drug Present and Declared for Prescription Verification   7-aminoclonazepam  65           EXPECTED   ng/mg creat    7-aminoclonazepam is an expected metabolite of clonazepam. Source of    clonazepam is a scheduled prescription medication.   Tramadol                       >4310        EXPECTED   ng/mg creat   O-Desmethyltramadol            >4310        EXPECTED   ng/mg creat   N-Desmethyltramadol            3447         EXPECTED    ng/mg creat    Source of tramadol is a prescription medication. O-desmethyltramadol    and N-desmethyltramadol are expected metabolites of tramadol.   Gabapentin                     PRESENT      EXPECTED   Cyclobenzaprine                PRESENT      EXPECTED   Desmethylcyclobenzaprine       PRESENT      EXPECTED    Desmethylcyclobenzaprine is an expected metabolite of    cyclobenzaprine.   Duloxetine                     PRESENT      EXPECTED   Trazodone                      PRESENT      EXPECTED   1,3 chlorophenyl piperazine    PRESENT      EXPECTED    1,3-chlorophenyl piperazine is an expected metabolite of trazodone.   Diphenhydramine                PRESENT      EXPECTED   Propranolol                    PRESENT      EXPECTED Drug Absent but Declared for Prescription Verification   Oxycodone                      Not Detected UNEXPECTED ng/mg creat   Acetaminophen                  Not Detected UNEXPECTED    Acetaminophen, as indicated in the declared medication list, is not    always detected even when used as directed.   Hydroxyzine                    Not Detected UNEXPECTED ==================================================================== Test                      Result    Flag   Units      Ref Range   Creatinine              116              mg/dL      >=20 ==================================================================== Declared Medications:  The flagging and interpretation on this report are based on the  following declared medications.  Unexpected results may arise from  inaccuracies in the declared medications.  **Note: The testing scope of this panel includes these  medications:  Clonazepam  Cyclobenzaprine (Flexeril)  Diphenhydramine (Benadryl)  Duloxetine (Cymbalta)  Gabapentin (Neurontin)  Hydroxyzine  Oxycodone (Roxicodone)  Propranolol (Inderal)  Tramadol (Ultram)  Trazodone (Desyrel)  **Note: The testing scope of this panel does not include small to   moderate amounts of these reported medications:  Acetaminophen  **Note: The testing scope of this panel does not include the  following reported medications:  Apixaban  Atorvastatin (Lipitor)  Betamethasone (Lotrisone)  Budesonide (Symbicort)  Calcium  Ciprofloxacin (Cipro)  Clobetasol  Clotrimazole (Lotrisone)  Esomeprazole (Nexium)  Eye Drops  Formoterol (Symbicort)  Hydralazine (Apresoline)  Hydrochlorothiazide (Hyzaar)  Iron  Losartan (Hyzaar)  Menthol  Multivitamin  Probiotic  Sodium Chloride  Sucralfate (Carafate)  Turmeric  Ubiquinone (CoQ10)  Vitamin B12  Vitamin C  Vitamin D3  Vitamin E ==================================================================== For clinical consultation, please call [phone]. ====================================================================       ROS  Constitutional: Denies any fever or chills Gastrointestinal: No reported hemesis, hematochezia, vomiting, or acute GI distress Musculoskeletal: Denies any acute onset joint swelling, redness, loss of ROM, or weakness Neurological: No reported episodes of acute onset apraxia, aphasia, dysarthria, agnosia, amnesia, paralysis, loss of coordination, or loss of consciousness  Medication Review  Acetaminophen, Apremilast, CoQ-10, DULoxetine, EPINEPHrine, Menthol (Topical Analgesic), Multiple Vitamins-Minerals, Naphazoline-Pheniramine, Prenatal MV-Min-Fe Fum-FA-DHA, Probiotic Product, Propylene Glycol, Turmeric, Vitamin B-12, Vitamin D3, Vitamin E, atorvastatin, budesonide-formoterol, calcium carbonate, celecoxib, ciclopirox, ciprofloxacin, clobetasol cream, clobetasol ointment, clonazePAM, clotrimazole-betamethasone, cyclobenzaprine, dicyclomine, diphenhydrAMINE, esomeprazole, fluticasone, gabapentin, hydrALAZINE, hydrOXYzine, hydrocortisone, ketoconazole, linaclotide, losartan-hydrochlorothiazide, methimazole, propranolol, sodium chloride, sucralfate, traMADol, traZODone, and vitamin  C  History Review  Allergy: Ms. Calvert is allergic to cephalexin; ibuprofen; shellfish allergy; and aspirin. Drug: Ms. Mcgrory  reports previous drug use. Drugs: Marijuana and Cocaine. Alcohol:  reports no history of alcohol use. Tobacco:  reports that she quit smoking about 27 years ago. She has never used smokeless tobacco. Social: Ms. Jacot  reports that she quit smoking about 27 years ago. She has never used smokeless tobacco. She reports previous drug use. Drugs: Marijuana and Cocaine. She reports that she does not drink alcohol. Medical:  has a past medical history of Anxiety, Asthma, Breast mass, Cervical disc disease, Chronic pain syndrome, Degenerative disc disease, lumbar, Depression, Fibromyalgia, Fibromyalgia, GERD (gastroesophageal reflux disease), Glaucoma, Graves disease, Hemorrhoids, Hyperlipidemia, Hypertension, and Thyroid disease. Surgical: Ms. Dampier  has a past surgical history that includes carpel tunn (Right); Hand surgery (Right); carpel tunnel (Left); Cesarean section; Shoulder surgery (Right); Back surgery; Neck surgery; Total hip arthroplasty (Right); Excision Morton's neuroma (Left, 02/05/2017); Colonoscopy; Joint replacement; Esophagogastroduodenoscopy (egd) with propofol (N/A, 06/26/2017); Knee Arthroplasty (Left, 08/11/2017); Knee arthroscopy (Right, 07/02/2018); and Reverse shoulder arthroplasty (Left, 06/24/2019). Family: family history includes Cancer in her mother; Heart disease in her father.  Laboratory Chemistry Profile   Renal Lab Results  Component Value Date   BUN 8 06/27/2019   CREATININE 0.51 06/27/2019   GFRAA >60 06/27/2019   GFRNONAA >60 06/27/2019     Hepatic Lab Results  Component Value Date   AST 27 08/18/2017   ALT 23 08/18/2017   ALBUMIN 3.5 08/18/2017   ALKPHOS 110 08/18/2017   LIPASE 27 12/14/2016     Electrolytes Lab Results  Component Value Date   NA 131 (L) 06/27/2019   K 3.3 (L) 06/27/2019   CL 93 (L) 06/27/2019   CALCIUM 9.1  06/27/2019   MG 1.9 08/27/2014     Bone No results found for: [place], VD125OH2TOT, [id number], [id number], 25OHVITD1, 25OHVITD2, 25OHVITD3, TESTOFREE, TESTOSTERONE   Inflammation (  CRP: Acute Phase) (ESR: Chronic Phase) Lab Results  Component Value Date   CRP <0.8 08/01/2017   ESRSEDRATE 11 08/01/2017       Note: Above Lab results reviewed.  Recent Imaging Review  DG PAIN CLINIC C-ARM 1-60 MIN NO REPORT Fluoro was used, but no Radiologist interpretation will be provided.  Please refer to "NOTES" tab for provider progress note. Note: Reviewed        Physical Exam  General appearance: Well nourished, well developed, and well hydrated. In no apparent acute distress Mental status: Alert, oriented x 3 (person, place, & time)       Respiratory: No evidence of acute respiratory distress Eyes: PERLA Vitals: BP 134/82   Pulse 73   Temp 98.2 F (36.8 C)   Ht '5\' 11"'$  (1.803 m)   Wt 240 lb (108.9 kg)   SpO2 99%   BMI 33.47 kg/m  BMI: Estimated body mass index is 33.47 kg/m as calculated from the following:   Height as of this encounter: '5\' 11"'$  (1.803 m).   Weight as of this encounter: 240 lb (108.9 kg). Ideal: Ideal body weight: 70.8 kg (156 lb 1.4 oz) Adjusted ideal body weight: 86 kg (189 lb 10.4 oz)   Lumbar Spine Area Exam  Skin & Axial Inspection: Well healed scar from previous spine surgery detected Alignment: Symmetrical Functional ROM: Pain restricted ROM       Stability: No instability detected Muscle Tone/Strength: Functionally intact. No obvious neuro-muscular anomalies detected. Sensory (Neurological): Dermatomal pain pattern  Lower Extremity Exam    Side: Right lower extremity  Side: Left lower extremity  Stability: No instability observed          Stability: No instability observed          Skin & Extremity Inspection: Skin color, temperature, and hair growth are WNL. No peripheral edema or cyanosis. No masses, redness, swelling, asymmetry, or associated skin  lesions. No contractures.  Skin & Extremity Inspection: Skin color, temperature, and hair growth are WNL. No peripheral edema or cyanosis. No masses, redness, swelling, asymmetry, or associated skin lesions. No contractures.  Functional ROM: Unrestricted ROM                  Functional ROM: Pain restricted ROM for hip and knee joints          Muscle Tone/Strength: Functionally intact. No obvious neuro-muscular anomalies detected.  Muscle Tone/Strength: Functionally intact. No obvious neuro-muscular anomalies detected.  Sensory (Neurological): Unimpaired        Sensory (Neurological): Dermatomal pain pattern        DTR: Patellar: deferred today Achilles: deferred today Plantar: deferred today  DTR: Patellar: deferred today Achilles: deferred today Plantar: deferred today  Palpation: No palpable anomalies  Palpation: No palpable anomalies    Assessment   Status Diagnosis  Persistent Persistent Persistent 1. Lumbar radiculopathy   2. Chronic pain syndrome   3. History of lumbar laminectomy for spinal cord decompression (L3-L4, 2015)   4. Back pain at L4-L5 level   5. Fibromyalgia      Updated Problems: Problem  Lumbar Radiculopathy  Chronic Pain Syndrome  Fibromyalgia  Primary Osteoarthritis of Right Knee  Primary Osteoarthritis of Left Knee  Back Pain At L4-L5 Level  Ddd (Degenerative Disc Disease), Lumbosacral  Severe Obesity (Bmi 35.0-35.9 With Comorbidity) ([facility])   Last Assessment & Plan:  The patient states they are working on activity levels and healthy diet.     DM II (Diabetes Mellitus, Type Ii), Controlled (  [facility])   Last Assessment & Plan:  Attempting to follow a diabetic diet and no excessive hypoglycemia is noted.       Plan of Care  [name] has a current medication list which includes the following long-term medication(s): atorvastatin, budesonide-formoterol, calcium carbonate, clonazepam, diphenhydramine, duloxetine, duloxetine, fluticasone,  gabapentin, hydralazine, linaclotide, losartan-hydrochlorothiazide, methimazole, propranolol, sodium chloride, trazodone, and dicyclomine.  Pharmacotherapy (Medications Ordered): Meds ordered this encounter  Medications  . traMADol (ULTRAM) 50 MG tablet    Sig: Take 1 tablet (50 mg total) by mouth every 8 (eight) hours as needed for severe pain.    Dispense:  90 tablet    Refill:  2    Fill one day early if pharmacy is closed on scheduled refill date.   Orders:  Orders Placed This Encounter  Procedures  . Lumbar Epidural Injection    Standing Status:   Future    Standing Expiration Date:   03/03/2020    Scheduling Instructions:     Procedure: Interlaminar Lumbar Epidural Steroid injection (LESI)            Laterality: Midline     Sedation: Patient's choice.     Timeframe: ASAA    Order Specific Question:   Where will this procedure be performed?    Answer:   [facility]   Follow-up plan:   Return in about 1 week (around 02/09/2020) for L-ESI , without sedation.     Status post right L5-S1 ESI 03/23/2019, 6 out of 8 cc injected, #2 on 04/21/19, 5 cc more concentrated, #3 on 06/14/2019: 6 cc, #4 09/29/19 left L5/S1 IL , #5 12/08/19     Recent Visits Date Type Provider Dept  12/08/19 Procedure visit Christine Santa, MD [facility]  Showing recent visits within past 90 days and meeting all other requirements   Today's Visits Date Type Provider Dept  02/02/20 Office Visit Christine Santa, MD [facility]  Showing today's visits and meeting all other requirements   Future Appointments No visits were found meeting these conditions.  Showing future appointments within next 90 days and meeting all other requirements   I discussed the assessment and treatment plan with the patient. The patient was provided an opportunity to ask questions and all were answered. The patient agreed with the plan and demonstrated an understanding of the instructions.  Patient advised to  call back or seek an in-person evaluation if the symptoms or condition worsens.  Duration of encounter: 31 minutes.  Note by: Christine Santa, MD Date: 02/02/2020; Time: 3:30 PM

## 2020-02-07 DIAGNOSIS — Z96612 Presence of left artificial shoulder joint: Secondary | ICD-10-CM | POA: Diagnosis not present

## 2020-02-07 DIAGNOSIS — M1711 Unilateral primary osteoarthritis, right knee: Secondary | ICD-10-CM | POA: Diagnosis not present

## 2020-02-07 DIAGNOSIS — M7582 Other shoulder lesions, left shoulder: Secondary | ICD-10-CM | POA: Diagnosis not present

## 2020-02-09 ENCOUNTER — Ambulatory Visit (INDEPENDENT_AMBULATORY_CARE_PROVIDER_SITE_OTHER): Payer: Medicare HMO | Admitting: Licensed Clinical Social Worker

## 2020-02-09 ENCOUNTER — Other Ambulatory Visit: Payer: Self-pay

## 2020-02-09 DIAGNOSIS — F4325 Adjustment disorder with mixed disturbance of emotions and conduct: Secondary | ICD-10-CM | POA: Diagnosis not present

## 2020-02-09 NOTE — Patient Instructions (Signed)
Caring for Your Mental Health Mental health is emotional, psychological, and social well-being. Mental health is just as important as physical health. In fact, mental and physical health are connected, and you need both to be healthy. Some signs of good mental health (well-being) include:  Being able to attend to tasks at home, school, or work.  Being able to manage stress and emotions.  Practicing self-care, which may include: ? A regular exercise pattern. ? A reasonably healthy diet. ? Supportive and trusting relationships. ? The ability to relax and calm yourself (self-calm).  Having pleasurable hobbies and activities to do.  Believing that you have meaning and purpose in your life.  Recovering and adjusting after facing challenges (resilience). You can take steps to build or strengthen these mentally healthy behaviors. There are resources and support to help you with this. Why is caring for mental health important? Caring for your mental health is a big part of staying healthy. Everyone has times when feelings, thoughts, or situations feel overwhelming. Mental health means having the skills to manage what feels overwhelming. If this sense of being overwhelmed persists, however, you might need some help. If you have some of the following signs, you may need to take better care of your mental health or seek help from a health care provider or mental health professional:  Problems with energy or focus.  Changes in eating habits.  Problems sleeping, such as sleeping too much or not enough.  Emotional distress, such as anger, sadness, depression, or anxiety.  Major changes in your relationships.  Losing interest in life or activities that you used to enjoy. If you have any of these symptoms on most days for 2 weeks or longer:  Talk with a close friend or family member about how you are feeling.  Contact your health care provider to discuss your symptoms.  Consider working with a  Education officer, community. Your health care provider, family, or friends may be able to recommend a therapist. What can I do to promote emotional and mental health? Managing emotions  Learn to identify emotions and deal with them. Recognizing your emotions is the first step in learning to deal with them.  Practice ways to appropriately express feelings. Remember that you can control your feelings. They do not control you.  Practice stress management techniques, such as: ? Relaxation techniques, like breathing or muscle relaxation exercises. ? Exercise. Regular activity can lower your stress level. ? Changing what you can change and accepting what you cannot change.  Build up your resilience so that you can recover and adjust after big problems or challenges. Practice resilient behaviors and attitudes: ? Set and focus on long-term goals. ? Develop and maintain healthy, supportive relationships. ? Learn to accept change and make the best of the situation. ? Take care of yourself physically by eating a healthy diet, getting plenty of sleep, and exercising regularly. ? Develop self-awareness. Ask others to give feedback about how they see you. ? Practice mindfulness meditation to help you stay calm when dealing with daily challenges. ? Learn to respond to situations in healthy ways, rather than reacting with your emotions. ? Keep a positive attitude, and believe in yourself. Your view of yourself affects your mental health. ? Develop your listening and empathy skills. These will help you deal with difficult situations and communications.  Remember that emotions can be used as a good source of communication and are a great source of energy. Try to laugh and find humor in life.  Sleeping  Get the right amount and quality of sleep. Sleep has a big impact on physical and mental health. To improve your sleep: ? Go to bed and wake up around the same time every day. ? Limit screen time before  bedtime. This includes the use of your cell phone, TV, computer, and tablet. ? Keep your bedroom dark and cool. Activity   Exercise or do some physical activity regularly. This helps: ? Keep your body strong, especially during times of stress. ? Get rid of chemicals in your body (hormones) that build up when you are stressed. ? Build up your resilience. Eating and drinking   Eat a healthy diet that includes whole grains, vegetables, fresh fruits, and lean proteins. If you have questions about what foods are best for you, ask your health care provider.  Try not to turn to sweet, salty, or otherwise unhealthy foods when you are tired or unhappy. This can lead to unwanted weight gain and is not a healthy way to cope with emotions. Where to find more information You can find more information about how to care for your mental health from:  National Alliance on Mental Illness (NAMI): www.nami.org  National Institute of Mental Health: www.nimh.nih.gov  Centers for Disease Control and Prevention: www.cdc.gov/hrqol/wellbeing.htm Contact a health care provider if:  You lose interest in being with others or you do not want to leave the house.  You have a hard time completing your normal activities or you have less energy than normal.  You cannot stay focused or you have problems with memory.  You feel that your senses are heightened, and this makes you upset or concerned.  You feel nervous or have rapid mood changes.  You are sleeping or eating more or less than normal.  You question reality or you show odd behavior that disturbs you or others. Get help right away if:  You have thoughts about hurting yourself or others. If you ever feel like you may hurt yourself or others, or have thoughts about taking your own life, get help right away. You can go to your nearest emergency department or call:  Your local emergency services (911 in the [place]  A suicide crisis helpline, such as the  National Suicide Prevention Lifeline at 1-800-273-8255. This is open 24 hours a day. Summary  Mental health is not just the absence of mental illness. It involves understanding your emotions and behaviors, and taking steps to cope with them in a healthy way.  If you have symptoms of mental or emotional distress, get help from family, friends, a health care provider, or a mental health professional.  Practice good mental health behaviors such as stress management skills, self-calming skills, exercise, and healthy sleeping and eating. This information is not intended to replace advice given to you by your health care provider. Make sure you discuss any questions you have with your health care provider. Document Revised: 07/25/2017 Document Reviewed: 12/24/2016 Elsevier Patient Education  2020 [employer]  
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
## 2020-02-10 ENCOUNTER — Other Ambulatory Visit: Payer: Self-pay

## 2020-02-10 DIAGNOSIS — R21 Rash and other nonspecific skin eruption: Secondary | ICD-10-CM

## 2020-02-10 MED ORDER — CLOBETASOL PROPIONATE 0.05 % EX OINT: TOPICAL_OINTMENT | CUTANEOUS | 1 refills | Status: DC

## 2020-02-10 NOTE — Progress Notes (Signed)
Patient needing RF of Clobetasol Ointment.

## 2020-02-17 DIAGNOSIS — J40 Bronchitis, not specified as acute or chronic: Secondary | ICD-10-CM | POA: Diagnosis not present

## 2020-02-17 DIAGNOSIS — Z87891 Personal history of nicotine dependence: Secondary | ICD-10-CM | POA: Diagnosis not present

## 2020-02-19 ENCOUNTER — Other Ambulatory Visit: Payer: Self-pay | Admitting: Student in an Organized Health Care Education/Training Program

## 2020-02-19 DIAGNOSIS — G894 Chronic pain syndrome: Secondary | ICD-10-CM

## 2020-02-19 DIAGNOSIS — M5416 Radiculopathy, lumbar region: Secondary | ICD-10-CM

## 2020-02-22 ENCOUNTER — Ambulatory Visit (INDEPENDENT_AMBULATORY_CARE_PROVIDER_SITE_OTHER): Payer: Medicare HMO | Admitting: Licensed Clinical Social Worker

## 2020-02-22 ENCOUNTER — Other Ambulatory Visit: Payer: Self-pay

## 2020-02-22 DIAGNOSIS — F325 Major depressive disorder, single episode, in full remission: Secondary | ICD-10-CM | POA: Diagnosis not present

## 2020-02-22 NOTE — Progress Notes (Addendum)
See note above

## 2020-02-22 NOTE — Progress Notes (Signed)
Virtual Visit via Video Note  I connected with Christine Higgins on 02/22/20 at 11:00 AM EDT by a video enabled telemedicine application and verified that I am speaking with the correct person using two identifiers.  Location: Patient: home Provider: [facility]   I discussed the limitations of evaluation and management by telemedicine and the availability of in person appointments. The patient expressed understanding and agreed to proceed.   I discussed the assessment and treatment plan with the patient. The patient was provided an opportunity to ask questions and all were answered. The patient agreed with the plan and demonstrated an understanding of the instructions.   The patient was advised to call back or seek an in-person evaluation if the symptoms worsen or if the condition fails to improve as anticipated.  I provided 30 minutes of non-face-to-face time during this encounter.   Dallie Patton R Taija Mathias, LCSW    THERAPIST PROGRESS NOTE  Session Time: 30  Participation Level: Active  Behavioral Response: NAAlertAnxious and Depressed  Type of Therapy: Individual Therapy  Treatment Goals addressed: Anxiety and Coping  Interventions: Supportive and Reframing  Summary: Christine Higgins is a 64 y.o. female who presents with continuing stress associated with upcoming court date and with family concerns.  Allowed pt to explore and express thoughts and feelings about upcoming court date--pt very tearful as she disclosed her feelings.  Discussed impulsive behaviors and potential triggers--helped pt identify specific behavior antecedents and behavior reinforcements. Discussed alternative scenarios where behaviors may be lessened or not occur at all.   Pt reports that she has good appetite, is compliant with medication, and is sleeping better when she takes 1/2 klonipin--pt has been going to bed early and its impacting sleep quality. Reviewed sleep hygiene.   Suicidal/Homicidal: No  Therapist  Response: Christine Higgins is displaying intermittent/fluctuating progress due to continuing external stressors. LCSW will continue to work with pt to improve stress management, mood management, and behavior management skills.   Plan: Return again in 1 weeks. LCSW requested 1 week follow up OPT appt.  Diagnosis: Axis I: Depressive Disorder NOS and Major Depression, Recurrent moderate    Axis II: No diagnosis    Christine Bo Chesnie Capell, LCSW 02/22/2020

## 2020-02-22 NOTE — Addendum Note (Signed)
Addended by: Granville Lewis on: 02/22/2020 12:19 PM   Modules accepted: Level of Service

## 2020-02-23 ENCOUNTER — Encounter: Payer: Self-pay | Admitting: Student in an Organized Health Care Education/Training Program

## 2020-02-23 ENCOUNTER — Ambulatory Visit: Admission: RE | Admit: 2020-02-23 | Payer: Medicare HMO | Source: Ambulatory Visit

## 2020-02-23 ENCOUNTER — Ambulatory Visit (HOSPITAL_BASED_OUTPATIENT_CLINIC_OR_DEPARTMENT_OTHER): Payer: Medicare HMO | Admitting: Student in an Organized Health Care Education/Training Program

## 2020-02-23 VITALS — BP 125/72 | HR 74 | Temp 97.2°F | Resp 18 | Ht 71.0 in | Wt 253.0 lb

## 2020-02-23 DIAGNOSIS — M5416 Radiculopathy, lumbar region: Secondary | ICD-10-CM

## 2020-02-23 DIAGNOSIS — E0590 Thyrotoxicosis, unspecified without thyrotoxic crisis or storm: Secondary | ICD-10-CM | POA: Diagnosis not present

## 2020-02-23 DIAGNOSIS — H05241 Constant exophthalmos, right eye: Secondary | ICD-10-CM | POA: Diagnosis not present

## 2020-02-23 DIAGNOSIS — H25813 Combined forms of age-related cataract, bilateral: Secondary | ICD-10-CM | POA: Diagnosis not present

## 2020-02-23 DIAGNOSIS — H40023 Open angle with borderline findings, high risk, bilateral: Secondary | ICD-10-CM | POA: Diagnosis not present

## 2020-02-23 DIAGNOSIS — H04123 Dry eye syndrome of bilateral lacrimal glands: Secondary | ICD-10-CM | POA: Diagnosis not present

## 2020-02-23 DIAGNOSIS — I10 Essential (primary) hypertension: Secondary | ICD-10-CM | POA: Diagnosis not present

## 2020-02-23 DIAGNOSIS — E7800 Pure hypercholesterolemia, unspecified: Secondary | ICD-10-CM | POA: Diagnosis not present

## 2020-02-23 DIAGNOSIS — H18413 Arcus senilis, bilateral: Secondary | ICD-10-CM | POA: Diagnosis not present

## 2020-02-23 DIAGNOSIS — H35033 Hypertensive retinopathy, bilateral: Secondary | ICD-10-CM | POA: Diagnosis not present

## 2020-02-23 MED ORDER — DEXAMETHASONE SODIUM PHOSPHATE 10 MG/ML IJ SOLN: 10.0000 mg | Freq: Once | INTRAMUSCULAR | Status: DC

## 2020-02-23 MED ORDER — IOHEXOL 180 MG/ML  SOLN: 10.0000 mL | Freq: Once | INTRAMUSCULAR | Status: DC

## 2020-02-23 MED ORDER — ROPIVACAINE HCL 2 MG/ML IJ SOLN: 2.0000 mL | Freq: Once | INTRAMUSCULAR | Status: DC

## 2020-02-23 MED ORDER — LIDOCAINE HCL 2 % IJ SOLN: 20.0000 mL | Freq: Once | INTRAMUSCULAR | Status: DC

## 2020-02-23 MED ORDER — SODIUM CHLORIDE 0.9% FLUSH: 2.0000 mL | Freq: Once | INTRAVENOUS | Status: DC

## 2020-02-23 NOTE — Progress Notes (Signed)
PROVIDER NOTE: Information contained herein reflects review and annotations entered in association with encounter. Interpretation of such information and data should be left to medically-trained personnel. Information provided to patient can be located elsewhere in the medical record under "Patient Instructions". Document created using STT-dictation technology, any transcriptional errors that may result from process are unintentional.    Patient: Christine Higgins  Service Category: Procedure  Provider: [name], MD  DOB: 1955-09-13  DOS: 02/23/2020  Location: [facility]  MRN: [id number]  Setting: Ambulatory - outpatient  Referring Provider: [name], MD  Type: Established Patient  Specialty: Interventional Pain Management  PCP: Kirk Ruths, MD   Primary Reason for Visit: Interventional Pain Management Treatment. CC:  Left low back and leg pain Procedure:          Anesthesia, Analgesia, Anxiolysis:  Type: Therapeutic Inter-Laminar Epidural Steroid Injection  #3 in 2021 ( #1 09/29/19, #2 12/08/19) Region: Lumbar Level: L5-S1 Level. Laterality: Left-Sided         Type: Local Anesthesia  Local Anesthetic: Lidocaine 1-2%  Position: Prone with head of the table was raised to facilitate breathing.   Indications: 1. Lumbar radiculopathy   2. Chronic pain syndrome    Pain Score: Pre-procedure:  /10 Post-procedure:  /10   Pre-op Assessment:  Christine Higgins is a 64 y.o. (year old), female patient, seen today for interventional treatment. She  has a past surgical history that includes carpel tunn (Right); Hand surgery (Right); carpel tunnel (Left); Cesarean section; Shoulder surgery (Right); Back surgery; Neck surgery; Total hip arthroplasty (Right); Excision Morton's neuroma (Left, 02/05/2017); Colonoscopy; Joint replacement; Esophagogastroduodenoscopy (egd) with propofol (N/A, 06/26/2017); Knee Arthroplasty (Left, 08/11/2017); Knee arthroscopy (Right, 07/02/2018); and Reverse  shoulder arthroplasty (Left, 06/24/2019). Christine Higgins has a current medication list which includes the following prescription(s): acetaminophen, otezla, vitamin c, atorvastatin, budesonide-formoterol, calcium carbonate, celecoxib, vitamin d3, ciclopirox, ciprofloxacin, clobetasol cream, clobetasol ointment, clonazepam, clotrimazole-betamethasone, coq-10, vitamin b-12, cyclobenzaprine, dicyclomine, diphenhydramine, duloxetine, duloxetine, epinephrine, esomeprazole, fluticasone, gabapentin, hydralazine, hydrocortisone, hydroxyzine, ketoconazole, linaclotide, losartan-hydrochlorothiazide, menthol (topical analgesic), methimazole, multiple vitamins-minerals, naphazoline-pheniramine, prenatal mv-min-fe fum-fa-dha, probiotic product, propranolol, propylene glycol, sodium chloride, sucralfate, tramadol, trazodone, turmeric, and vitamin e, and the following Facility-Administered Medications: dexamethasone, iohexol, lidocaine, ropivacaine (pf) 2 mg/ml (0.2%), and sodium chloride flush. Her primarily concern today is the No chief complaint on file.  Initial Vital Signs:  Pulse/HCG Rate:    Temp:   Resp:   BP:   SpO2:    BMI: Estimated body mass index is 33.47 kg/m as calculated from the following:   Height as of 02/02/20: 5\' 11"  (1.803 m).   Weight as of 02/02/20: 240 lb (108.9 kg).  Risk Assessment: Allergies: Reviewed. She is allergic to cephalexin, ibuprofen, shellfish allergy, and aspirin.  Allergy Precautions: None required Coagulopathies: Reviewed. None identified.  Blood-thinner therapy: None at this time Active Infection(s): Reviewed. None identified. Christine Higgins is afebrile  Site Confirmation: Christine Higgins was asked to confirm the procedure and laterality before marking the site Procedure checklist: Completed Consent: Before the procedure and under the influence of no sedative(s), amnesic(s), or anxiolytics, the patient was informed of the treatment options, risks and possible complications. To fulfill  our ethical and legal obligations, as recommended by the American Medical Association's Code of Ethics, I have informed the patient of my clinical impression; the nature and purpose of the treatment or procedure; the risks, benefits, and possible complications of the intervention; the alternatives, including doing nothing; the risk(s) and benefit(s) of the alternative treatment(s)  or procedure(s); and the risk(s) and benefit(s) of doing nothing. The patient was provided information about the general risks and possible complications associated with the procedure. These may include, but are not limited to: failure to achieve desired goals, infection, bleeding, organ or nerve damage, allergic reactions, paralysis, and death. In addition, the patient was informed of those risks and complications associated to Spine-related procedures, such as failure to decrease pain; infection (i.e.: Meningitis, epidural or intraspinal abscess); bleeding (i.e.: epidural hematoma, subarachnoid hemorrhage, or any other type of intraspinal or peri-dural bleeding); organ or nerve damage (i.e.: Any type of peripheral nerve, nerve root, or spinal cord injury) with subsequent damage to sensory, motor, and/or autonomic systems, resulting in permanent pain, numbness, and/or weakness of one or several areas of the body; allergic reactions; (i.e.: anaphylactic reaction); and/or death. Furthermore, the patient was informed of those risks and complications associated with the medications. These include, but are not limited to: allergic reactions (i.e.: anaphylactic or anaphylactoid reaction(s)); adrenal axis suppression; blood sugar elevation that in diabetics may result in ketoacidosis or comma; water retention that in patients with history of congestive heart failure may result in shortness of breath, pulmonary edema, and decompensation with resultant heart failure; weight gain; swelling or edema; medication-induced neural toxicity;  particulate matter embolism and blood vessel occlusion with resultant organ, and/or nervous system infarction; and/or aseptic necrosis of one or more joints. Finally, the patient was informed that Medicine is not an exact science; therefore, there is also the possibility of unforeseen or unpredictable risks and/or possible complications that may result in a catastrophic outcome. The patient indicated having understood very clearly. We have given the patient no guarantees and we have made no promises. Enough time was given to the patient to ask questions, all of which were answered to the patient's satisfaction. Ms. Kewley has indicated that she wanted to continue with the procedure. Attestation: I, the ordering provider, attest that I have discussed with the patient the benefits, risks, side-effects, alternatives, likelihood of achieving goals, and potential problems during recovery for the procedure that I have provided informed consent. Date  Time:   Pre-Procedure Preparation:  Monitoring: As per clinic protocol. Respiration, ETCO2, SpO2, BP, heart rate and rhythm monitor placed and checked for adequate function Safety Precautions: Patient was assessed for positional comfort and pressure points before starting the procedure. Time-out: I initiated and conducted the "Time-out" before starting the procedure, as per protocol. The patient was asked to participate by confirming the accuracy of the "Time Out" information. Verification of the correct person, site, and procedure were performed and confirmed by me, the nursing staff, and the patient. "Time-out" conducted as per Joint Commission's Universal Protocol (UP.01.01.01). Time:    Description of Procedure:          Target Area: The interlaminar space, initially targeting the lower laminar border of the superior vertebral body. Approach: Paramedial approach. Area Prepped: Entire Posterior Lumbar Region Prepping solution: DuraPrep (Iodine Povacrylex  [0.7% available iodine] and Isopropyl Alcohol, 74% w/w) Safety Precautions: Aspiration looking for blood return was conducted prior to all injections. At no point did we inject any substances, as a needle was being advanced. No attempts were made at seeking any paresthesias. Safe injection practices and needle disposal techniques used. Medications properly checked for expiration dates. SDV (single dose vial) medications used. Description of the Procedure: Protocol guidelines were followed. The procedure needle was introduced through the skin, ipsilateral to the reported pain, and advanced to the target area. Bone was contacted  and the needle walked caudad, until the lamina was cleared. The epidural space was identified using "loss-of-resistance technique" with 2-3 ml of PF-NaCl (0.9% NSS), in a 5cc LOR glass syringe.  There were no vitals filed for this visit.  Start Time:   hrs. End Time:   hrs.  Materials:  Needle(s) Type: Epidural needle Gauge: 17G Length: 5-in Medication(s): Please see orders for medications and dosing details. 5 cc solution made of 2 cc of preservative-free saline, 2 cc of 0.2% ropivacaine, 1 cc of Decadron 10 mg/cc. Imaging Guidance (Spinal):          Type of Imaging Technique: Fluoroscopy Guidance (Spinal) Indication(s): Assistance in needle guidance and placement for procedures requiring needle placement in or near specific anatomical locations not easily accessible without such assistance. Exposure Time: Please see nurses notes. Contrast: Before injecting any contrast, we confirmed that the patient did not have an allergy to iodine, shellfish, or radiological contrast. Once satisfactory needle placement was completed at the desired level, radiological contrast was injected. Contrast injected under live fluoroscopy. No contrast complications. See chart for type and volume of contrast used. Fluoroscopic Guidance: I was personally present during the use of fluoroscopy.  "Tunnel Vision Technique" used to obtain the best possible view of the target area. Parallax error corrected before commencing the procedure. "Direction-depth-direction" technique used to introduce the needle under continuous pulsed fluoroscopy. Once target was reached, antero-posterior, oblique, and lateral fluoroscopic projection used confirm needle placement in all planes. Images permanently stored in EMR. Interpretation: I personally interpreted the imaging intraoperatively. Adequate needle placement confirmed in multiple planes. Appropriate spread of contrast into desired area was observed. No evidence of afferent or efferent intravascular uptake. No intrathecal or subarachnoid spread observed. Permanent images saved into the patient's record.  Antibiotic Prophylaxis:   Anti-infectives (From admission, onward)   None     Indication(s): None identified  Post-operative Assessment:  Post-procedure Vital Signs:  Pulse/HCG Rate:    Temp:   Resp:   BP:   SpO2:    EBL: None  Complications: No immediate post-treatment complications observed by team, or reported by patient.  Note: The patient tolerated the entire procedure well. A repeat set of vitals were taken after the procedure and the patient was kept under observation following institutional policy, for this type of procedure. Post-procedural neurological assessment was performed, showing return to baseline, prior to discharge. The patient was provided with post-procedure discharge instructions, including a section on how to identify potential problems. Should any problems arise concerning this procedure, the patient was given instructions to immediately contact us, at any time, without hesitation. In any case, we plan to contact the patient by telephone for a follow-up status report regarding this interventional procedure.  Comments:  No additional relevant information.  Plan of Care  Orders:  Orders Placed This Encounter  Procedures   . [facility] C-ARM 1-60 MIN NO REPORT    Intraoperative interpretation by procedural physician at [facility].    Standing Status:   Standing    Number of Occurrences:   1    Order Specific Question:   Reason for exam:    Answer:   Assistance in needle guidance and placement for procedures requiring needle placement in or near specific anatomical locations not easily accessible without such assistance.   Medications ordered for procedure: Meds ordered this encounter  Medications  . iohexol (OMNIPAQUE) 180 MG/ML injection 10 mL    Must be Myelogram-compatible. If not available, you may substitute  with a water-soluble, non-ionic, hypoallergenic, myelogram-compatible radiological contrast medium.  Marland Kitchen lidocaine (XYLOCAINE) 2 % (with pres) injection 400 mg  . ropivacaine (PF) 2 mg/mL (0.2%) (NAROPIN) injection 2 mL  . sodium chloride flush (NS) 0.9 % injection 2 mL  . dexamethasone (DECADRON) injection 10 mg   Medications administered: Mishael A. Hilyer had no medications administered during this visit.  See the medical record for exact dosing, route, and time of administration.  Follow-up plan:   No follow-ups on file.      Status post right L5-S1 ESI 03/23/2019, 6 out of 8 cc injected, #2 on 04/21/19, 5 cc more concentrated, #3 on 06/14/2019: 6 cc, #4 09/29/19 left L5/S1 [place] , #5 12/08/19, #6 02/23/20    Recent Visits Date Type Provider Dept  02/02/20 Office Visit [name], MD [facility]  12/08/19 Procedure visit [name], MD [facility]  Showing recent visits within past 90 days and meeting all other requirements Today's Visits Date Type Provider Dept  02/23/20 Procedure visit [name], MD [facility]  Showing today's visits and meeting all other requirements Future Appointments Date Type Provider Dept  05/02/20 Appointment [name], MD [facility]  Showing future appointments within next 90 days and meeting all  other requirements  Disposition: Discharge home  Discharge (Date  Time): 02/23/2020;   hrs.   Primary Care Physician: Kirk Ruths, MD Location: [facility] Note by: [name], MD Date: 02/23/2020; Time: 8:20 AM  Disclaimer:  Medicine is not an exact science. The only guarantee in medicine is that nothing is guaranteed. It is important to note that the decision to proceed with this intervention was based on the information collected from the patient. The Data and conclusions were drawn from the patient's questionnaire, the interview, and the physical examination. Because the information was provided in large part by the patient, it cannot be guaranteed that it has not been purposely or unconsciously manipulated. Every effort has been made to obtain as much relevant data as possible for this evaluation. It is important to note that the conclusions that lead to this procedure are derived in large part from the available data. Always take into account that the treatment will also be dependent on availability of resources and existing treatment guidelines, considered by other Pain Management Practitioners as being common knowledge and practice, at the time of the intervention. For Medico-Legal purposes, it is also important to point out that variation in procedural techniques and pharmacological choices are the acceptable norm. The indications, contraindications, technique, and results of the above procedure should only be interpreted and judged by a Board-Certified Interventional Pain Specialist with extensive familiarity and expertise in the same exact procedure and technique.

## 2020-02-23 NOTE — Progress Notes (Signed)
Safety precautions to be maintained throughout the outpatient stay will include: orient to surroundings, keep bed in low position, maintain call bell within reach at all times, provide assistance with transfer out of bed and ambulation. Procedure cancelled due to having bronchitis and on antibiotics.

## 2020-03-01 ENCOUNTER — Other Ambulatory Visit: Payer: Self-pay

## 2020-03-01 ENCOUNTER — Ambulatory Visit (INDEPENDENT_AMBULATORY_CARE_PROVIDER_SITE_OTHER): Payer: Medicare HMO | Admitting: Licensed Clinical Social Worker

## 2020-03-01 DIAGNOSIS — F4325 Adjustment disorder with mixed disturbance of emotions and conduct: Secondary | ICD-10-CM

## 2020-03-01 NOTE — Patient Instructions (Addendum)
To whom it may concern:  Per Rockney Ghee request, this letter is confirming that Christine Higgins has attended three outpatient behavioral health visits with myself on the following dates:  02/09/20, 02/22/20, and 03/01/20.   Although treatment is still in preliminary stages, patient Christine Higgins) is progressing towards treatment goals.  Thank you,  Jeanmarie Plant, MSW, [facility] [phone]

## 2020-03-01 NOTE — Progress Notes (Signed)
Virtual Visit via Telephone Note  I connected with Christine Higgins on 03/01/20 at  8:00 AM EDT by telephone and verified that I am speaking with the correct person using two identifiers. Multiple unsuccessful video attempts.   Location: Patient: home Provider: [facility]   I discussed the limitations, risks, security and privacy concerns of performing an evaluation and management service by telephone and the availability of in person appointments. I also discussed with the patient that there may be a patient responsible charge related to this service. The patient expressed understanding and agreed to proceed.   The patient was advised to call back or seek an in-person evaluation if the symptoms worsen or if the condition fails to improve as anticipated.  I provided 30 minutes of non-face-to-face time during this encounter.   Mozell Haber R Bertice Risse, LCSW   THERAPIST PROGRESS NOTE  Session Time: 8:15-8:45 am (on and off--initial bad virtual connection)  Participation Level: Active  Behavioral Response: NeatAlertAnxious  Type of Therapy: Individual Therapy  Treatment Goals addressed: Coping  Interventions: CBT  Summary: [name] is a 64 y.o. female who presents with escalating anxiety due to external stressors. Reviewed behavior management strategies: antecedents, behaviors of concern, and consequences. Encouraged pt to continue focusing on overall self care under times of high stress.   Suicidal/Homicidal: No  Therapist Response: Freda Munro is displaying signs of progress due to a decrease in overall depression and anxiety symptoms, and an increase in overall impulse control. LCSW and pt will continue to work on impulse control and target behavior.   Plan: Return again in 4 weeks.  Diagnosis: Axis I: Adjustment Disorder with Mixed Disturbance of Emotions and Conduct    Axis II: No diagnosis    [place], LCSW 03/01/2020

## 2020-03-03 ENCOUNTER — Ambulatory Visit: Payer: Medicaid Other | Admitting: Licensed Clinical Social Worker

## 2020-03-07 DIAGNOSIS — D72819 Decreased white blood cell count, unspecified: Secondary | ICD-10-CM | POA: Diagnosis not present

## 2020-03-07 DIAGNOSIS — L4050 Arthropathic psoriasis, unspecified: Secondary | ICD-10-CM | POA: Diagnosis not present

## 2020-03-07 DIAGNOSIS — M797 Fibromyalgia: Secondary | ICD-10-CM | POA: Diagnosis not present

## 2020-03-07 DIAGNOSIS — L409 Psoriasis, unspecified: Secondary | ICD-10-CM | POA: Diagnosis not present

## 2020-03-07 DIAGNOSIS — Z79899 Other long term (current) drug therapy: Secondary | ICD-10-CM | POA: Diagnosis not present

## 2020-03-07 DIAGNOSIS — M8949 Other hypertrophic osteoarthropathy, multiple sites: Secondary | ICD-10-CM | POA: Diagnosis not present

## 2020-03-08 ENCOUNTER — Ambulatory Visit (HOSPITAL_BASED_OUTPATIENT_CLINIC_OR_DEPARTMENT_OTHER): Payer: Medicare HMO | Admitting: Student in an Organized Health Care Education/Training Program

## 2020-03-08 ENCOUNTER — Other Ambulatory Visit: Payer: Self-pay

## 2020-03-08 ENCOUNTER — Ambulatory Visit
Admission: RE | Admit: 2020-03-08 | Discharge: 2020-03-08 | Disposition: A | Discharge status: Home / Self Care | Payer: Medicare HMO | Source: Ambulatory Visit | Attending: Student in an Organized Health Care Education/Training Program | Admitting: Student in an Organized Health Care Education/Training Program

## 2020-03-08 ENCOUNTER — Encounter: Payer: Self-pay | Admitting: Student in an Organized Health Care Education/Training Program

## 2020-03-08 VITALS — BP 141/85 | HR 75 | Temp 98.2°F | Resp 16 | Ht 71.0 in | Wt 254.0 lb

## 2020-03-08 DIAGNOSIS — M5416 Radiculopathy, lumbar region: Secondary | ICD-10-CM

## 2020-03-08 DIAGNOSIS — G894 Chronic pain syndrome: Secondary | ICD-10-CM | POA: Diagnosis not present

## 2020-03-08 MED ORDER — DEXAMETHASONE SODIUM PHOSPHATE 10 MG/ML IJ SOLN
10.0000 mg | Freq: Once | INTRAMUSCULAR | Status: AC
  Administered 2020-03-08: 10 mg

## 2020-03-08 MED ORDER — LIDOCAINE HCL 2 % IJ SOLN
INTRAMUSCULAR | Status: AC
  Filled 2020-03-08: qty 20

## 2020-03-08 MED ORDER — SODIUM CHLORIDE (PF) 0.9 % IJ SOLN
INTRAMUSCULAR | Status: AC
  Filled 2020-03-08: qty 10

## 2020-03-08 MED ORDER — LIDOCAINE HCL 2 % IJ SOLN
20.0000 mL | Freq: Once | INTRAMUSCULAR | Status: AC
  Administered 2020-03-08: 400 mg

## 2020-03-08 MED ORDER — ROPIVACAINE HCL 2 MG/ML IJ SOLN
2.0000 mL | Freq: Once | INTRAMUSCULAR | Status: AC
  Administered 2020-03-08: 2 mL via EPIDURAL

## 2020-03-08 MED ORDER — DEXAMETHASONE SODIUM PHOSPHATE 10 MG/ML IJ SOLN
INTRAMUSCULAR | Status: AC
  Filled 2020-03-08: qty 1

## 2020-03-08 MED ORDER — IOHEXOL 180 MG/ML  SOLN
10.0000 mL | Freq: Once | INTRAMUSCULAR | Status: AC
  Administered 2020-03-08: 10 mL via EPIDURAL

## 2020-03-08 MED ORDER — ROPIVACAINE HCL 2 MG/ML IJ SOLN
INTRAMUSCULAR | Status: AC
  Filled 2020-03-08: qty 10

## 2020-03-08 MED ORDER — SODIUM CHLORIDE 0.9% FLUSH
2.0000 mL | Freq: Once | INTRAVENOUS | Status: AC
  Administered 2020-03-08: 2 mL

## 2020-03-08 NOTE — Progress Notes (Signed)
PROVIDER NOTE: Information contained herein reflects review and annotations entered in association with encounter. Interpretation of such information and data should be left to medically-trained personnel. Information provided to patient can be located elsewhere in the medical record under "Patient Instructions". Document created using STT-dictation technology, any transcriptional errors that may result from process are unintentional.    Patient: Christine Higgins  Service Category: Procedure  Provider: [name], MD  DOB: May 27, 1956  DOS: 03/08/2020  Location: [facility]  MRN: [id number]  Setting: Ambulatory - outpatient  Referring Provider: Kirk Ruths, MD  Type: Established Patient  Specialty: Interventional Pain Management  PCP: Kirk Ruths, MD   Primary Reason for Visit: Interventional Pain Management Treatment. CC:  Left low back and leg pain Procedure:          Anesthesia, Analgesia, Anxiolysis:  Type: Therapeutic Inter-Laminar Epidural Steroid Injection  #3 in 2021 ( #1 09/29/19, #2 12/08/19, Region: Lumbar Level: L5-S1 Level. Laterality: Left-Sided         Type: Local Anesthesia  Local Anesthetic: Lidocaine 1-2%  Position: Prone with head of the table was raised to facilitate breathing.   Indications: 1. Lumbar radiculopathy   2. Chronic pain syndrome    Pain Score: Pre-procedure: 9 /10 Post-procedure: 5/10   Of note, patient did not have a lumbar epidural steroid injection on 02/23/2020 secondary to her being on antibiotics for bronchitis.  It was rescheduled to today.  Pre-op Assessment:  Christine Higgins is a 64 y.o. (year old), female patient, seen today for interventional treatment. She  has a past surgical history that includes carpel tunn (Right); Hand surgery (Right); carpel tunnel (Left); Cesarean section; Shoulder surgery (Right); Back surgery; Neck surgery; Total hip arthroplasty (Right); Excision Morton's neuroma (Left, 02/05/2017);  Colonoscopy; Joint replacement; Esophagogastroduodenoscopy (egd) with propofol (N/A, 06/26/2017); Knee Arthroplasty (Left, 08/11/2017); Knee arthroscopy (Right, 07/02/2018); and Reverse shoulder arthroplasty (Left, 06/24/2019). Christine Higgins has a current medication list which includes the following prescription(s): acetaminophen, otezla, vitamin c, atorvastatin, budesonide-formoterol, calcium carbonate, celecoxib, vitamin d3, ciclopirox, clobetasol cream, clobetasol ointment, clonazepam, clotrimazole-betamethasone, coq-10, vitamin b-12, cyclobenzaprine, dicyclomine, diphenhydramine, duloxetine, duloxetine, epinephrine, esomeprazole, fluticasone, gabapentin, hydralazine, hydrocortisone, hydroxyzine, ketoconazole, linaclotide, losartan-hydrochlorothiazide, menthol (topical analgesic), methimazole, multiple vitamins-minerals, naphazoline-pheniramine, prenatal mv-min-fe fum-fa-dha, probiotic product, propranolol, propylene glycol, sodium chloride, sucralfate, tramadol, trazodone, turmeric, vitamin e, and ciprofloxacin, and the following Facility-Administered Medications: dexamethasone, iohexol, lidocaine, ropivacaine (pf) 2 mg/ml (0.2%), and sodium chloride flush. Her primarily concern today is the Back Pain (lower)  Initial Vital Signs:  Pulse/HCG Rate: 78  Temp: 98.2 F (36.8 C) Resp: 15 BP: 121/76 SpO2: 100 %  BMI: Estimated body mass index is 35.43 kg/m as calculated from the following:   Height as of this encounter: 5\' 11"  (1.803 m).   Weight as of this encounter: 254 lb (115.2 kg).  Risk Assessment: Allergies: Reviewed. She is allergic to cephalexin, ibuprofen, shellfish allergy, and aspirin.  Allergy Precautions: None required Coagulopathies: Reviewed. None identified.  Blood-thinner therapy: None at this time Active Infection(s): Reviewed. None identified. Christine Higgins is afebrile  Site Confirmation: Christine Higgins was asked to confirm the procedure and laterality before marking the site Procedure  checklist: Completed Consent: Before the procedure and under the influence of no sedative(s), amnesic(s), or anxiolytics, the patient was informed of the treatment options, risks and possible complications. To fulfill our ethical and legal obligations, as recommended by the American Medical Association's Code of Ethics, I have informed the patient of my clinical impression; the nature and purpose  of the treatment or procedure; the risks, benefits, and possible complications of the intervention; the alternatives, including doing nothing; the risk(s) and benefit(s) of the alternative treatment(s) or procedure(s); and the risk(s) and benefit(s) of doing nothing. The patient was provided information about the general risks and possible complications associated with the procedure. These may include, but are not limited to: failure to achieve desired goals, infection, bleeding, organ or nerve damage, allergic reactions, paralysis, and death. In addition, the patient was informed of those risks and complications associated to Spine-related procedures, such as failure to decrease pain; infection (i.e.: Meningitis, epidural or intraspinal abscess); bleeding (i.e.: epidural hematoma, subarachnoid hemorrhage, or any other type of intraspinal or peri-dural bleeding); organ or nerve damage (i.e.: Any type of peripheral nerve, nerve root, or spinal cord injury) with subsequent damage to sensory, motor, and/or autonomic systems, resulting in permanent pain, numbness, and/or weakness of one or several areas of the body; allergic reactions; (i.e.: anaphylactic reaction); and/or death. Furthermore, the patient was informed of those risks and complications associated with the medications. These include, but are not limited to: allergic reactions (i.e.: anaphylactic or anaphylactoid reaction(s)); adrenal axis suppression; blood sugar elevation that in diabetics may result in ketoacidosis or comma; water retention that in patients  with history of congestive heart failure may result in shortness of breath, pulmonary edema, and decompensation with resultant heart failure; weight gain; swelling or edema; medication-induced neural toxicity; particulate matter embolism and blood vessel occlusion with resultant organ, and/or nervous system infarction; and/or aseptic necrosis of one or more joints. Finally, the patient was informed that Medicine is not an exact science; therefore, there is also the possibility of unforeseen or unpredictable risks and/or possible complications that may result in a catastrophic outcome. The patient indicated having understood very clearly. We have given the patient no guarantees and we have made no promises. Enough time was given to the patient to ask questions, all of which were answered to the patient's satisfaction. Ms. Kreps has indicated that she wanted to continue with the procedure. Attestation: I, the ordering provider, attest that I have discussed with the patient the benefits, risks, side-effects, alternatives, likelihood of achieving goals, and potential problems during recovery for the procedure that I have provided informed consent. Date  Time:   Pre-Procedure Preparation:  Monitoring: As per clinic protocol. Respiration, ETCO2, SpO2, BP, heart rate and rhythm monitor placed and checked for adequate function Safety Precautions: Patient was assessed for positional comfort and pressure points before starting the procedure. Time-out: I initiated and conducted the "Time-out" before starting the procedure, as per protocol. The patient was asked to participate by confirming the accuracy of the "Time Out" information. Verification of the correct person, site, and procedure were performed and confirmed by me, the nursing staff, and the patient. "Time-out" conducted as per Joint Commission's Universal Protocol (UP.01.01.01). Time: 1051  Description of Procedure:          Target Area: The interlaminar  space, initially targeting the lower laminar border of the superior vertebral body. Approach: Paramedial approach. Area Prepped: Entire Posterior Lumbar Region Prepping solution: DuraPrep (Iodine Povacrylex [0.7% available iodine] and Isopropyl Alcohol, 74% w/w) Safety Precautions: Aspiration looking for blood return was conducted prior to all injections. At no point did we inject any substances, as a needle was being advanced. No attempts were made at seeking any paresthesias. Safe injection practices and needle disposal techniques used. Medications properly checked for expiration dates. SDV (single dose vial) medications used. Description of the Procedure:  Protocol guidelines were followed. The procedure needle was introduced through the skin, ipsilateral to the reported pain, and advanced to the target area. Bone was contacted and the needle walked caudad, until the lamina was cleared. The epidural space was identified using "loss-of-resistance technique" with 2-3 ml of PF-NaCl (0.9% NSS), in a 5cc LOR glass syringe.  Vitals:   03/08/20 1008 03/08/20 1045 03/08/20 1055  BP: 121/76 136/82 (!) 141/85  Pulse: 78 77 75  Resp:  15 16  Temp: 98.2 F (36.8 C)    TempSrc: Oral    SpO2: 100% 100% 100%  Weight: 254 lb (115.2 kg)    Height: 5\' 11"  (1.803 m)      Start Time: 1051 hrs. End Time: 1058 hrs.  Materials:  Needle(s) Type: Epidural needle Gauge: 17G Length: 5-in Medication(s): Please see orders for medications and dosing details. 6.5 cc solution made of 3 cc of preservative-free saline, 2.5 cc of 0.2% ropivacaine, 1 cc of Decadron 10 mg/cc. Imaging Guidance (Spinal):          Type of Imaging Technique: Fluoroscopy Guidance (Spinal) Indication(s): Assistance in needle guidance and placement for procedures requiring needle placement in or near specific anatomical locations not easily accessible without such assistance. Exposure Time: Please see nurses notes. Contrast: Before injecting  any contrast, we confirmed that the patient did not have an allergy to iodine, shellfish, or radiological contrast. Once satisfactory needle placement was completed at the desired level, radiological contrast was injected. Contrast injected under live fluoroscopy. No contrast complications. See chart for type and volume of contrast used. Fluoroscopic Guidance: I was personally present during the use of fluoroscopy. "Tunnel Vision Technique" used to obtain the best possible view of the target area. Parallax error corrected before commencing the procedure. "Direction-depth-direction" technique used to introduce the needle under continuous pulsed fluoroscopy. Once target was reached, antero-posterior, oblique, and lateral fluoroscopic projection used confirm needle placement in all planes. Images permanently stored in EMR. Interpretation: I personally interpreted the imaging intraoperatively. Adequate needle placement confirmed in multiple planes. Appropriate spread of contrast into desired area was observed. No evidence of afferent or efferent intravascular uptake. No intrathecal or subarachnoid spread observed. Permanent images saved into the patient's record.  Antibiotic Prophylaxis:   Anti-infectives (From admission, onward)   None     Indication(s): None identified  Post-operative Assessment:  Post-procedure Vital Signs:  Pulse/HCG Rate: 75  Temp: 98.2 F (36.8 C) Resp: 16 BP: (!) 141/85 SpO2: 100 %  EBL: None  Complications: No immediate post-treatment complications observed by team, or reported by patient.  Note: The patient tolerated the entire procedure well. A repeat set of vitals were taken after the procedure and the patient was kept under observation following institutional policy, for this type of procedure. Post-procedural neurological assessment was performed, showing return to baseline, prior to discharge. The patient was provided with post-procedure discharge instructions,  including a section on how to identify potential problems. Should any problems arise concerning this procedure, the patient was given instructions to immediately contact us, at any time, without hesitation. In any case, we plan to contact the patient by telephone for a follow-up status report regarding this interventional procedure.  Comments:  No additional relevant information.  Plan of Care   I also evaluated the patient's T12-L1 interlaminar windows and they seem patent for a spinal cord stimulator trial.  We had a brief discussion about this.  I provided the patient with [place] resources regarding spinal cord stimulation.  We will  discuss at her next visit if she would like to proceed with this.  If so, she will need thoracic MRI and psych eval before scheduling with [employer].  Orders:  Orders Placed This Encounter  Procedures  . DG PAIN CLINIC C-ARM 1-60 MIN NO REPORT    Intraoperative interpretation by procedural physician at [place].    Standing Status:   Standing    Number of Occurrences:   1    Order Specific Question:   Reason for exam:    Answer:   Assistance in needle guidance and placement for procedures requiring needle placement in or near specific anatomical locations not easily accessible without such assistance.   Medications ordered for procedure: Meds ordered this encounter  Medications  . iohexol (OMNIPAQUE) 180 MG/ML injection 10 mL    Must be Myelogram-compatible. If not available, you may substitute with a water-soluble, non-ionic, hypoallergenic, myelogram-compatible radiological contrast medium.  Marland Kitchen lidocaine (XYLOCAINE) 2 % (with pres) injection 400 mg  . ropivacaine (PF) 2 mg/mL (0.2%) (NAROPIN) injection 2 mL  . sodium chloride flush (NS) 0.9 % injection 2 mL  . dexamethasone (DECADRON) injection 10 mg   Medications administered: We administered iohexol, lidocaine, ropivacaine (PF) 2 mg/mL (0.2%), sodium chloride flush, and  dexamethasone.  See the medical record for exact dosing, route, and time of administration.  Follow-up plan:   Return for [facility] appt.      Status post right L5-S1 ESI 03/23/2019, 6 out of 8 cc injected, #2 on 04/21/19, 5 cc more concentrated, #3 on 06/14/2019: 6 cc, #4 09/29/19 left L5/S1 IL , #5 12/08/19, #6 02/23/20    Recent Visits Date Type Provider Dept  02/02/20 Office Visit [name], MD [facility]  Showing recent visits within past 90 days and meeting all other requirements Today's Visits Date Type Provider Dept  03/08/20 Procedure visit [name], MD [facility]  Showing today's visits and meeting all other requirements Future Appointments Date Type Provider Dept  05/02/20 Appointment [name], MD [facility]  Showing future appointments within next 90 days and meeting all other requirements  Disposition: Discharge home  Discharge (Date  Time): 03/08/2020; 1101 hrs.   Primary Care Physician: Kirk Ruths, MD Location: [facility] Note by: [name], MD Date: 03/08/2020; Time: 12:29 PM  Disclaimer:  Medicine is not an exact science. The only guarantee in medicine is that nothing is guaranteed. It is important to note that the decision to proceed with this intervention was based on the information collected from the patient. The Data and conclusions were drawn from the patient's questionnaire, the interview, and the physical examination. Because the information was provided in large part by the patient, it cannot be guaranteed that it has not been purposely or unconsciously manipulated. Every effort has been made to obtain as much relevant data as possible for this evaluation. It is important to note that the conclusions that lead to this procedure are derived in large part from the available data. Always take into account that the treatment will also be dependent on availability of resources and  existing treatment guidelines, considered by other Pain Management Practitioners as being common knowledge and practice, at the time of the intervention. For Medico-Legal purposes, it is also important to point out that variation in procedural techniques and pharmacological choices are the acceptable norm. The indications, contraindications, technique, and results of the above procedure should only be interpreted and judged by a Board-Certified  Interventional Pain Specialist with extensive familiarity and expertise in the same exact procedure and technique.

## 2020-03-08 NOTE — Progress Notes (Signed)
Safety precautions to be maintained throughout the outpatient stay will include: orient to surroundings, keep bed in low position, maintain call bell within reach at all times, provide assistance with transfer out of bed and ambulation.  

## 2020-03-08 NOTE — Patient Instructions (Signed)

## 2020-03-09 ENCOUNTER — Telehealth: Payer: Self-pay

## 2020-03-09 NOTE — Telephone Encounter (Signed)
Post procedure phone call.  Patient states she is doing well.  

## 2020-03-13 ENCOUNTER — Ambulatory Visit
Admission: RE | Admit: 2020-03-13 | Discharge: 2020-03-13 | Disposition: A | Discharge status: Home / Self Care | Payer: Medicare HMO | Source: Ambulatory Visit | Attending: Obstetrics and Gynecology | Admitting: Obstetrics and Gynecology

## 2020-03-13 DIAGNOSIS — Z1231 Encounter for screening mammogram for malignant neoplasm of breast: Secondary | ICD-10-CM | POA: Diagnosis not present

## 2020-03-15 ENCOUNTER — Ambulatory Visit (INDEPENDENT_AMBULATORY_CARE_PROVIDER_SITE_OTHER): Payer: Medicare HMO | Admitting: Licensed Clinical Social Worker

## 2020-03-15 ENCOUNTER — Other Ambulatory Visit: Payer: Self-pay

## 2020-03-15 DIAGNOSIS — F4325 Adjustment disorder with mixed disturbance of emotions and conduct: Secondary | ICD-10-CM | POA: Diagnosis not present

## 2020-03-15 NOTE — Progress Notes (Signed)
Virtual Visit via Telephone Note  I connected with Christine Higgins on 03/15/20 at  9:00 AM EDT by telephone and verified that I am speaking with the correct person using two identifiers. Attempted video visit unsuccessfully.  Location: Patient: home Provider: remote office   I discussed the limitations, risks, security and privacy concerns of performing an evaluation and management service by telephone and the availability of in person appointments. I also discussed with the patient that there may be a patient responsible charge related to this service. The patient expressed understanding and agreed to proceed.   The patient was advised to call back or seek an in-person evaluation if the symptoms worsen or if the condition fails to improve as anticipated.  I provided 45 minutes of non-face-to-face time during this encounter.   Jamarious Febo R Idelle Reimann, LCSW   THERAPIST PROGRESS NOTE  Session Time: 9:00-9:45 am  Participation Level: Active  Behavioral Response: NAAlertAnxious and Depressed  Type of Therapy: Individual Therapy  Treatment Goals addressed: Anxiety and Coping  Interventions: CBT and Supportive  Summary: [name] is a 64 y.o. female who presents with stable mood and overall improvements in stress and anxiety management. Pt relieved about recent court date being continued.  Allowed pt to explore and express thoughts and feelings about family relationships and friendships.  Pt has mixed feelings about certain family members and feeling unsupported.  Pt has rekindled some friendships from the past, which pt reports has helped her significantly. Encouraged overall self care, life balance, and focus on emotional and physical wellness.   Suicidal/Homicidal: No  Therapist Response: Freda Munro reports a decrease in overall symptoms, which is evidence of progress. Will continue working towards goals of eliminating behaviors of concern, maintaining stable mood, and stress/anxiety  management.   Plan: Return again in 3 weeks. Will continue CBT  Diagnosis: Axis I: Adjustment Disorder with Mixed Emotional Features and Adjustment Disorder with Disturbance of Conduct    Axis II: No diagnosis    Margreta Journey R Roberts Bon, LCSW 03/15/2020

## 2020-03-16 ENCOUNTER — Telehealth: Payer: Self-pay | Admitting: Student in an Organized Health Care Education/Training Program

## 2020-03-16 DIAGNOSIS — H25811 Combined forms of age-related cataract, right eye: Secondary | ICD-10-CM | POA: Diagnosis not present

## 2020-03-16 NOTE — Telephone Encounter (Signed)
Has LESI on 03-08-20. Pain is "shooting" down into left leg, worse that it has ever been. She is asking if anything else can be done for the pain.

## 2020-03-16 NOTE — Telephone Encounter (Signed)
Patient states she is having severe pain left lower side down into her leg, worse than before. Please call patient.

## 2020-03-20 NOTE — Telephone Encounter (Signed)
Please set up F2F visit to discuss spinal cord stim trial. Would not recommend another ESI

## 2020-03-20 NOTE — Telephone Encounter (Signed)
Please call patient to schedule.

## 2020-03-27 ENCOUNTER — Ambulatory Visit
Payer: Medicare HMO | Attending: Student in an Organized Health Care Education/Training Program | Admitting: Student in an Organized Health Care Education/Training Program

## 2020-03-27 ENCOUNTER — Encounter: Payer: Self-pay | Admitting: Student in an Organized Health Care Education/Training Program

## 2020-03-27 ENCOUNTER — Other Ambulatory Visit: Payer: Self-pay

## 2020-03-27 VITALS — BP 135/91 | HR 82 | Temp 97.3°F | Resp 18 | Ht 70.0 in | Wt 247.0 lb

## 2020-03-27 DIAGNOSIS — Z9889 Other specified postprocedural states: Secondary | ICD-10-CM | POA: Diagnosis not present

## 2020-03-27 DIAGNOSIS — M546 Pain in thoracic spine: Secondary | ICD-10-CM | POA: Insufficient documentation

## 2020-03-27 DIAGNOSIS — G8929 Other chronic pain: Secondary | ICD-10-CM | POA: Insufficient documentation

## 2020-03-27 DIAGNOSIS — M961 Postlaminectomy syndrome, not elsewhere classified: Secondary | ICD-10-CM | POA: Diagnosis not present

## 2020-03-27 DIAGNOSIS — M5416 Radiculopathy, lumbar region: Secondary | ICD-10-CM | POA: Insufficient documentation

## 2020-03-27 DIAGNOSIS — M5137 Other intervertebral disc degeneration, lumbosacral region: Secondary | ICD-10-CM | POA: Diagnosis not present

## 2020-03-27 NOTE — Progress Notes (Signed)
Safety precautions to be maintained throughout the outpatient stay will include: orient to surroundings, keep bed in low position, maintain call bell within reach at all times, provide assistance with transfer out of bed and ambulation.  

## 2020-03-27 NOTE — Progress Notes (Signed)
PROVIDER NOTE: Information contained herein reflects review and annotations entered in association with encounter. Interpretation of such information and data should be left to medically-trained personnel. Information provided to patient can be located elsewhere in the medical record under "Patient Instructions". Document created using STT-dictation technology, any transcriptional errors that may result from process are unintentional.    Patient: Christine Higgins  Service Category: E/M  Provider: [name], MD  DOB: Mar 26, 1956  DOS: 03/27/2020  Specialty: Interventional Pain Management  MRN: [id number]  Setting: Ambulatory outpatient  PCP: Christine Ruths, MD  Type: Established Patient    Referring Provider: Kirk Ruths, MD  Location: Office  Delivery: Face-to-face     HPI  Reason for encounter: Christine Higgins, a 64 y.o. year old female, is here today for evaluation and management of her Failed back surgical syndrome [M96.1]. Christine Higgins primary complain today is Back Pain (low) Last encounter: Practice (03/16/2020). My last encounter with her was on 03/16/2020. Pertinent problems: Christine Higgins has Failed back surgical syndrome; DDD (degenerative disc disease), lumbosacral; Severe obesity (BMI 35.0-35.9 with comorbidity) ([place]); Primary osteoarthritis of right knee; Primary osteoarthritis of left knee; DM II (diabetes mellitus, type II), controlled ([place]); Chronic pain syndrome; Fibromyalgia; and Lumbar radiculopathy on their pertinent problem list. Pain Assessment: Severity of Chronic pain is reported as a 6 /10. Location: Back Lower/radiates down both legs in the side and front to toes. Onset: More than a month ago. Quality: Aching, Burning, Throbbing. Timing: Constant. Modifying factor(s): pain meds, ice. Vitals:  height is '5\' 10"'$  (1.778 m) and weight is 247 lb (112 kg). Her temperature is 97.3 F (36.3 C) (abnormal). Her blood pressure is 135/91 (abnormal) and her pulse is 82. Her  respiration is 18 and oxygen saturation is 96%.   Patient presents today for follow-up evaluation to discuss treatment plans for her chronic lumbar radicular pain in the context of failed back surgical syndrome with an L3-L4 decompression in 2015.  Patient has been receiving lumbar epidural steroid injections to help manage her symptoms but we have noticed diminishing returns with epidural steroid therapy.  Her last injection was 03/08/2020.  She is at best getting 2 to 3 weeks of moderate to significant pain relief after her lumbar epidural steroid injection return of symptoms thereafter.  She presents today to discuss spinal cord stimulator trial.  I had extensive discussion with the patient regarding what spinal cord stimulation entails.  This was reviewed with her using a spine model.  Please see below for assessment and plan.  ROS  Constitutional: Denies any fever or chills Gastrointestinal: No reported hemesis, hematochezia, vomiting, or acute GI distress Musculoskeletal: Back pain and bilateral leg pain Neurological: Right leg weakness, tingling, numbness  Medication Review  Acetaminophen, Apremilast, CoQ-10, DULoxetine, EPINEPHrine, Menthol (Topical Analgesic), Multiple Vitamins-Minerals, Naphazoline-Pheniramine, Prenatal MV-Min-Fe Fum-FA-DHA, Probiotic Product, Propylene Glycol, Turmeric, Vitamin B-12, Vitamin D3, Vitamin E, atorvastatin, budesonide-formoterol, calcium carbonate, celecoxib, ciclopirox, ciprofloxacin, clobetasol cream, clobetasol ointment, clonazePAM, clotrimazole-betamethasone, cyclobenzaprine, dicyclomine, diphenhydrAMINE, esomeprazole, fluticasone, gabapentin, hydrALAZINE, hydrOXYzine, hydrocortisone, ketoconazole, linaclotide, losartan-hydrochlorothiazide, methimazole, propranolol, sodium chloride, sucralfate, traMADol, traZODone, vitamin A, and vitamin C  History Review  Allergy: Christine Higgins is allergic to cephalexin, ibuprofen, shellfish allergy, and aspirin. Drug: [name]  reports previous drug use. Drugs: Marijuana and Cocaine. Alcohol:  reports no history of alcohol use. Tobacco:  reports that she quit smoking about 27 years ago. She has never used smokeless tobacco. Social: Christine Higgins  reports that she quit smoking about 27 years  ago. She has never used smokeless tobacco. She reports previous drug use. Drugs: Marijuana and Cocaine. She reports that she does not drink alcohol. Medical:  has a past medical history of Anxiety, Asthma, Breast mass, Cervical disc disease, Chronic pain syndrome, Degenerative disc disease, lumbar, Depression, Fibromyalgia, Fibromyalgia, GERD (gastroesophageal reflux disease), Glaucoma, Graves disease, Hemorrhoids, Hyperlipidemia, Hypertension, and Thyroid disease. Surgical: Christine Higgins  has a past surgical history that includes carpel tunn (Right); Hand surgery (Right); carpel tunnel (Left); Cesarean section; Shoulder surgery (Right); Back surgery; Neck surgery; Total hip arthroplasty (Right); Excision Morton's neuroma (Left, 02/05/2017); Colonoscopy; Joint replacement; Esophagogastroduodenoscopy (egd) with propofol (N/A, 06/26/2017); Knee Arthroplasty (Left, 08/11/2017); Knee arthroscopy (Right, 07/02/2018); and Reverse shoulder arthroplasty (Left, 06/24/2019). Family: family history includes Cancer in her mother; Heart disease in her father.  Laboratory Chemistry Profile   Renal Lab Results  Component Value Date   BUN 8 06/27/2019   CREATININE 0.51 06/27/2019   GFRAA >60 06/27/2019   GFRNONAA >60 06/27/2019     Hepatic Lab Results  Component Value Date   AST 27 08/18/2017   ALT 23 08/18/2017   ALBUMIN 3.5 08/18/2017   ALKPHOS 110 08/18/2017   LIPASE 27 12/14/2016     Electrolytes Lab Results  Component Value Date   NA 131 (L) 06/27/2019   K 3.3 (L) 06/27/2019   CL 93 (L) 06/27/2019   CALCIUM 9.1 06/27/2019   MG 1.9 08/27/2014     Bone No results found for: VD25OH, VD125OH2TOT, ER7408XK4, [id number], 25OHVITD1,  25OHVITD2, 25OHVITD3, TESTOFREE, TESTOSTERONE   Inflammation (CRP: Acute Phase) (ESR: Chronic Phase) Lab Results  Component Value Date   CRP <0.8 08/01/2017   ESRSEDRATE 11 08/01/2017       Note: Above Lab results reviewed.  Recent Imaging Review  MM 3D SCREEN BREAST BILATERAL CLINICAL DATA:  Screening.  EXAM: DIGITAL SCREENING BILATERAL MAMMOGRAM WITH TOMO AND CAD  COMPARISON:  Previous exam(s).  ACR Breast Density Category b: There are scattered areas of fibroglandular density.  FINDINGS: There are no findings suspicious for malignancy. Images were processed with CAD.  IMPRESSION: No mammographic evidence of malignancy. A result letter of this screening mammogram will be mailed directly to the patient.  RECOMMENDATION: Screening mammogram in one year. (Code:SM-B-01Y)  BI-RADS CATEGORY  1: Negative.  Electronically Signed   By: Abelardo Diesel M.D.   On: 03/13/2020 14:28 Note: Reviewed        Physical Exam  General appearance: alert, cooperative and mildly obese Mental status: Alert, oriented x 3 (person, place, & time)       Respiratory: No evidence of acute respiratory distress Eyes: PERLA Vitals: BP (!) 135/91   Pulse 82   Temp (!) 97.3 F (36.3 C)   Resp 18   Ht '5\' 10"'$  (1.778 m)   Wt 247 lb (112 kg)   SpO2 96%   BMI 35.44 kg/m  BMI: Estimated body mass index is 35.44 kg/m as calculated from the following:   Height as of this encounter: '5\' 10"'$  (1.778 m).   Weight as of this encounter: 247 lb (112 kg). Ideal: Ideal body weight: 68.5 kg (151 lb 0.2 oz) Adjusted ideal body weight: 85.9 kg (189 lb 6.5 oz)   Lumbar Spine Area Exam  Skin & Axial Inspection: Well healed scar from previous spine surgery detected Alignment: Symmetrical Functional ROM: Pain restricted ROM       Stability: No instability detected Muscle Tone/Strength: Functionally intact. No obvious neuro-muscular anomalies detected. Sensory (Neurological): Dermatomal pain  pattern  Positive straight leg raise test bilaterally  Lower Extremity Exam    Side: Right lower extremity  Side: Left lower extremity  Stability: No instability observed          Stability: No instability observed          Skin & Extremity Inspection: Skin color, temperature, and hair growth are WNL. No peripheral edema or cyanosis. No masses, redness, swelling, asymmetry, or associated skin lesions. No contractures.  Skin & Extremity Inspection: Skin color, temperature, and hair growth are WNL. No peripheral edema or cyanosis. No masses, redness, swelling, asymmetry, or associated skin lesions. No contractures.  Functional ROM: Unrestricted ROM                  Functional ROM: Pain restricted ROM for hip and knee joints          Muscle Tone/Strength: Functionally intact. No obvious neuro-muscular anomalies detected.  Muscle Tone/Strength: Functionally intact. No obvious neuro-muscular anomalies detected.  Sensory (Neurological): Unimpaired        Sensory (Neurological): Dermatomal pain pattern        DTR: Patellar: deferred today Achilles: deferred today Plantar: deferred today  DTR: Patellar: deferred today Achilles: deferred today Plantar: deferred today  Palpation: No palpable anomalies  Palpation: No palpable anomalies     Assessment   Status Diagnosis  Persistent Persistent Persistent 1. Failed back surgical syndrome   2. Lumbar radiculopathy   3. History of lumbar laminectomy for spinal cord decompression (L3-L4, 2015)   4. DDD (degenerative disc disease), lumbosacral   5. Chronic midline thoracic back pain      Updated Problems: Problem  Failed Back Surgical Syndrome  Chronic Midline Thoracic Back Pain    Plan of Care    I discussed  percutaneous spinal cord stimulator trial with the patient in detail. I explained to the patient that they will have an external power source and programmer which the patient will use for 7 days. There will be daily  communication with the stimulator company and the patient. A possible need for a mid-trial clinic visit to give the patient the best chance of success.   Patient will need to have a thorough psychosocial behavioral evaluation. Our office will be happy to help facilitate this. Will place referral to Dr Lyman Speller.  Furthermore, it has been greater than 2 years since the patient's previous lumbar MRI.  Recommend repeating lumbar MRI as well as thoracic MRI for spinal cord stimulator trial planning and subsequent implant planning.  Will hopefully rule out thoracic canal stenosis with thoracic MRI  Some of patient's pain does seem to be mechanical in nature, with some component of neurogenic pain as well. We discussed the indications for spinal cord stimulation, specifically stating that it is typically better for neuropathic and appendicular pain, but that we have had some success in the treatment of low back and hip pain.   Patient is interested in proceeding with spinal cord stimulation trial pending results of her lumbar, thoracic MRI. She understands that this may not be successful, and that spinal cord stimulation in general is not a "magic bullet."   We had a lengthy and very detailed discussion of all the risks, benefits, alternatives, and rationale of surgery as well as the option of continuing nonsurgical therapies. We specifically discussed the risks of temporary or permanent worsened neurologic injury, no symptomatic relief or pain made worse after procedure, and also the need for future surgery (due to infection, CSF leak, bleeding, adjacent segment issues,  bone-healing difficulties, and other related issues). No guarantees of outcome were made or implied and he is eager to proceed and presents for definitive treatment.   Pt  told me that all of her questions were answered thoroughly and to her satisfaction. Confidence and understanding of the discussed risks and consequences of  treatment was  expressed and he accepted these risks and was eager to proceed with procedure pending imaging studies.   Issues concerning treatment and diagnosis were discussed with the patient. There are no barriers to understanding the plan of treatment. Explanation was well received by patient and/or family who then verbalized understanding.    Orders:  Orders Placed This Encounter  Procedures  . MR LUMBAR SPINE WO CONTRAST    Patient presents with axial pain with possible radicular component.  In addition to any acute findings, please report on:  1. Facet (Zygapophyseal) joint DJD (Hypertrophy, space narrowing, subchondral sclerosis, and/or osteophyte formation) 2. DDD and/or IVDD (Loss of disc height, desiccation or "Black disc disease") 3. Pars defects 4. Spondylolisthesis, spondylosis, and/or spondyloarthropathies (include Degree/Grade of displacement in mm) 5. Vertebral body Fractures, including age (old, new/acute) 16. Modic Type Changes 7. Demineralization 8. Bone pathology 9. Central, Lateral Recess, and/or Foraminal Stenosis (include AP diameter of stenosis in mm) 10. Surgical changes (hardware type, status, and presence of fibrosis)  NOTE: Please specify level(s) and laterality.    Standing Status:   Future    Standing Expiration Date:   06/27/2020    Order Specific Question:   What is the patient's sedation requirement?    Answer:   No Sedation    Order Specific Question:   Does the patient have a pacemaker or implanted devices?    Answer:   No    Order Specific Question:   Preferred imaging location?    Answer:   [facility] (table limit-350lbs)    Order Specific Question:   Call Results- Best Contact Number?    Answer:   [phone] ([facility])    Order Specific Question:   Radiology Contrast Protocol - do NOT remove file path    Answer:   \\[email or web address]  . MR THORACIC SPINE WO CONTRAST    Patient presents with axial pain with  possible radicular component.  In addition to any acute findings, please report on:  1. Facet (Zygapophyseal) joint DJD (Hypertrophy, space narrowing, subchondral sclerosis, and/or osteophyte formation) 2. DDD and/or IVDD (Loss of disc height, desiccation or "Black disc disease") 3. Pars defects 4. Spondylolisthesis, spondylosis, and/or spondyloarthropathies (include Degree/Grade of displacement in mm) 5. Vertebral body Fractures, including age (old, new/acute) 82. Modic Type Changes 7. Demineralization 8. Bone pathology 9. Central, Lateral Recess, and/or Foraminal Stenosis (include AP diameter of stenosis in mm) 10. Surgical changes (hardware type, status, and presence of fibrosis) NOTE: Please specify level(s) and laterality.    Standing Status:   Future    Standing Expiration Date:   06/27/2020    Order Specific Question:   What is the patient's sedation requirement?    Answer:   No Sedation    Order Specific Question:   Does the patient have a pacemaker or implanted devices?    Answer:   No    Order Specific Question:   Preferred imaging location?    Answer:   [facility] (table limit-350lbs)    Order Specific Question:   Call Results- Best Contact Number?    Answer:   [phone]) [phone] ([facility])    Order Specific Question:  Radiology Contrast Protocol - do NOT remove file path    Answer:   \\[email or web address]  . Ambulatory referral to Psychology    Referral Priority:   Routine    Referral Type:   Psychiatric    Referral Reason:   Specialty Services Required    Referred to Provider:   Renaee Munda, PhD    Requested Specialty:   Psychology    Number of Visits Requested:   1   Follow-up plan:   Return for Keep sch. appt.     Status post right L5-S1 ESI 03/23/2019, 6 out of 8 cc injected, #2 on 04/21/19, 5 cc more concentrated, #3 on 06/14/2019: 6 cc, #4 09/29/19 left L5/S1 [place] , #5 12/08/19, #6 02/23/20     Recent Visits Date Type Provider  Dept  03/08/20 Procedure visit [name], MD [facility]  02/02/20 Office Visit [name], MD [facility]  Showing recent visits within past 90 days and meeting all other requirements Today's Visits Date Type Provider Dept  03/27/20 Office Visit [name], MD [facility]  Showing today's visits and meeting all other requirements Future Appointments Date Type Provider Dept  05/02/20 Appointment [name], MD [facility]  Showing future appointments within next 90 days and meeting all other requirements  I discussed the assessment and treatment plan with the patient. The patient was provided an opportunity to ask questions and all were answered. The patient agreed with the plan and demonstrated an understanding of the instructions.  Patient advised to call back or seek an in-person evaluation if the symptoms or condition worsens.  Duration of encounter: 30 minutes.  Note by: [name], MD Date: 03/27/2020; Time: 2:35 PM

## 2020-03-29 ENCOUNTER — Telehealth: Payer: Self-pay | Admitting: *Deleted

## 2020-03-31 DIAGNOSIS — M5416 Radiculopathy, lumbar region: Secondary | ICD-10-CM | POA: Diagnosis not present

## 2020-03-31 DIAGNOSIS — M5136 Other intervertebral disc degeneration, lumbar region: Secondary | ICD-10-CM | POA: Diagnosis not present

## 2020-04-04 DIAGNOSIS — E0500 Thyrotoxicosis with diffuse goiter without thyrotoxic crisis or storm: Secondary | ICD-10-CM | POA: Diagnosis not present

## 2020-04-04 DIAGNOSIS — E042 Nontoxic multinodular goiter: Secondary | ICD-10-CM | POA: Diagnosis not present

## 2020-04-05 ENCOUNTER — Ambulatory Visit (INDEPENDENT_AMBULATORY_CARE_PROVIDER_SITE_OTHER): Payer: Medicare HMO | Admitting: Licensed Clinical Social Worker

## 2020-04-05 ENCOUNTER — Other Ambulatory Visit: Payer: Self-pay

## 2020-04-05 DIAGNOSIS — F4325 Adjustment disorder with mixed disturbance of emotions and conduct: Secondary | ICD-10-CM | POA: Diagnosis not present

## 2020-04-05 NOTE — Progress Notes (Signed)
Virtual Visit via Video Note  I connected with Christine Higgins on 04/05/20 at 10:00 AM EDT by a video enabled telemedicine application and verified that I am speaking with the correct person using two identifiers.  Location: Patient: home Provider: remote office [place], [place])   I discussed the limitations of evaluation and management by telemedicine and the availability of in person appointments. The patient expressed understanding and agreed to proceed.   The patient was advised to call back or seek an in-person evaluation if the symptoms worsen or if the condition fails to improve as anticipated.  I provided 45 minutes of non-face-to-face time during this encounter.   Bret Vanessen R Rees Matura, LCSW    THERAPIST PROGRESS NOTE  Session Time: 10:00-10:45 am  Participation Level: Active  Behavioral Response: Neat and Well GroomedAlertAnxious and Depressed  Type of Therapy: Individual Therapy  Treatment Goals addressed: Anxiety  Interventions: CBT and Reframing  Summary: Christine Higgins is a 64 y.o. female who presents with improving symptoms related to diagnosis (adjustment disorder).    Allowed pt to explore and express thoughts and feelings about pending court date and allowed pt to explore the stealing situation.  "I'm glad I got caught".  Pt expressed that she had inner feelings of guilt and shame--and that she feels free from those feelings right now.   Explored past trauma, and how trauma can trigger thoughts/feelings about self. Allowed pt to express thoughts/feelings about self and compare past self to present self. Allowed pt to see differences and how pt has grown throughout the years.   Encouraged pt to continue focusing on self-care, life balance, and overall physical and emotional health. Encouraged self-monitoring of behavior and abstaining from theft of any kind.    Suicidal/Homicidal: No  Therapist Response: Christine Higgins was actively engaged throughout session. Christine Higgins  reports that she has not engaged in any impulsive behaviors since last session (stealing).  Pt also is able to explore and express thoughts and feelings about past trauma and manage associated stress and mood--which is indicative of continuing progress.  Plan: Return again in 3 weeks. Ongoing treatment plan will include managing behaviors, management mood, managing anxiety/stress, and trauma processing.   Diagnosis: Axis I: Adjustment Disorder with Mixed Emotional Features    Axis II: No diagnosis    Christine Bo Argusta Mcgann, LCSW 04/05/2020

## 2020-04-15 DIAGNOSIS — K625 Hemorrhage of anus and rectum: Secondary | ICD-10-CM | POA: Diagnosis not present

## 2020-04-15 DIAGNOSIS — K644 Residual hemorrhoidal skin tags: Secondary | ICD-10-CM | POA: Diagnosis not present

## 2020-04-15 DIAGNOSIS — K648 Other hemorrhoids: Secondary | ICD-10-CM | POA: Diagnosis not present

## 2020-04-17 ENCOUNTER — Other Ambulatory Visit: Payer: Self-pay

## 2020-04-17 ENCOUNTER — Ambulatory Visit
Admission: RE | Admit: 2020-04-17 | Discharge: 2020-04-17 | Disposition: A | Discharge status: Home / Self Care | Payer: Medicare HMO | Source: Ambulatory Visit | Attending: Student in an Organized Health Care Education/Training Program | Admitting: Student in an Organized Health Care Education/Training Program

## 2020-04-17 DIAGNOSIS — G8929 Other chronic pain: Secondary | ICD-10-CM | POA: Insufficient documentation

## 2020-04-17 DIAGNOSIS — M47816 Spondylosis without myelopathy or radiculopathy, lumbar region: Secondary | ICD-10-CM | POA: Diagnosis not present

## 2020-04-17 DIAGNOSIS — M5137 Other intervertebral disc degeneration, lumbosacral region: Secondary | ICD-10-CM | POA: Insufficient documentation

## 2020-04-17 DIAGNOSIS — M5416 Radiculopathy, lumbar region: Secondary | ICD-10-CM | POA: Diagnosis not present

## 2020-04-17 DIAGNOSIS — M546 Pain in thoracic spine: Secondary | ICD-10-CM | POA: Diagnosis not present

## 2020-04-17 DIAGNOSIS — M47814 Spondylosis without myelopathy or radiculopathy, thoracic region: Secondary | ICD-10-CM | POA: Diagnosis not present

## 2020-04-17 DIAGNOSIS — M4804 Spinal stenosis, thoracic region: Secondary | ICD-10-CM | POA: Diagnosis not present

## 2020-04-17 DIAGNOSIS — M5127 Other intervertebral disc displacement, lumbosacral region: Secondary | ICD-10-CM | POA: Diagnosis not present

## 2020-04-17 DIAGNOSIS — Z9889 Other specified postprocedural states: Secondary | ICD-10-CM

## 2020-04-17 DIAGNOSIS — M48061 Spinal stenosis, lumbar region without neurogenic claudication: Secondary | ICD-10-CM | POA: Diagnosis not present

## 2020-04-17 DIAGNOSIS — M5126 Other intervertebral disc displacement, lumbar region: Secondary | ICD-10-CM | POA: Diagnosis not present

## 2020-04-18 DIAGNOSIS — M549 Dorsalgia, unspecified: Secondary | ICD-10-CM | POA: Diagnosis not present

## 2020-04-18 DIAGNOSIS — G894 Chronic pain syndrome: Secondary | ICD-10-CM | POA: Diagnosis not present

## 2020-04-18 DIAGNOSIS — F411 Generalized anxiety disorder: Secondary | ICD-10-CM | POA: Diagnosis not present

## 2020-04-19 DIAGNOSIS — H25811 Combined forms of age-related cataract, right eye: Secondary | ICD-10-CM | POA: Diagnosis not present

## 2020-04-19 DIAGNOSIS — H2511 Age-related nuclear cataract, right eye: Secondary | ICD-10-CM | POA: Diagnosis not present

## 2020-04-27 ENCOUNTER — Other Ambulatory Visit: Payer: Self-pay

## 2020-04-27 ENCOUNTER — Ambulatory Visit (INDEPENDENT_AMBULATORY_CARE_PROVIDER_SITE_OTHER): Payer: Medicare HMO | Admitting: Dermatology

## 2020-04-27 DIAGNOSIS — L219 Seborrheic dermatitis, unspecified: Secondary | ICD-10-CM | POA: Diagnosis not present

## 2020-04-27 DIAGNOSIS — B351 Tinea unguium: Secondary | ICD-10-CM | POA: Diagnosis not present

## 2020-04-27 DIAGNOSIS — B356 Tinea cruris: Secondary | ICD-10-CM

## 2020-04-27 DIAGNOSIS — L409 Psoriasis, unspecified: Secondary | ICD-10-CM

## 2020-04-27 MED ORDER — CICLOPIROX OLAMINE 0.77 % EX CREA: TOPICAL_CREAM | CUTANEOUS | 2 refills | Status: DC | PRN

## 2020-04-27 MED ORDER — CLOBETASOL PROPIONATE 0.05 % EX OINT: TOPICAL_OINTMENT | CUTANEOUS | 2 refills | Status: DC

## 2020-04-27 MED ORDER — TERBINAFINE HCL 250 MG PO TABS: ORAL_TABLET | ORAL | 0 refills | Status: DC

## 2020-04-27 MED ORDER — FLUOCINOLONE ACETONIDE BODY 0.01 % EX OIL: TOPICAL_OIL | CUTANEOUS | 2 refills | Status: DC

## 2020-04-27 NOTE — Patient Instructions (Signed)
Topical steroids (such as triamcinolone, fluocinolone, fluocinonide, mometasone, clobetasol, halobetasol, betamethasone, hydrocortisone) can cause thinning and lightening of the skin if they are used for too long in the same area. Your physician has selected the right strength medicine for your problem and area affected on the body. Please use your medication only as directed by your physician to prevent side effects.   . 

## 2020-04-27 NOTE — Progress Notes (Signed)
   Follow-Up Visit   Subjective  Christine Higgins is a 64 y.o. female who presents for the following: Rash.  Patient here today for multiple rashes. Rash at breast and groin gets better with ciclopirox, patient does not have any but was given clotrimazole-betamethasone by PCP which is not helping. Patient was using ketoconazole 2% cream and HC 2.5% cream for rash at abdomen. Psoriasis at elbows was treated with clobetasol ointment but patient is out.   Patient has also been diagnosed with psoriatic arthritis by rheumatologist and put on Otezla 30mg  twice daily a few months ago with minimal side effects, mainly diarrhea.   The following portions of the chart were reviewed this encounter and updated as appropriate:  Tobacco  Allergies  Meds  Problems  Med Hx  Surg Hx  Fam Hx      Review of Systems:  No other skin or systemic complaints except as noted in HPI or Assessment and Plan.  Objective  Well appearing patient in no apparent distress; mood and affect are within normal limits.  A focused examination was performed including face, chest, abdomen, arms, groin, scalp, hands, feet, nails. Relevant physical exam findings are noted in the Assessment and Plan.  Objective  Scalp: Mild erythema  Objective  Left elbow, Right elbow: Scaly pink plaques  Objective  groin: Pink annular plaques  Objective  Right Hallux Toe Nail Plate: Thickened nails with subungual debris   Assessment & Plan  Seborrheic dermatitis Scalp  Chronic, not at goal  Start fluocinolone oil to scalp twice daily as needed for itch.  Topical steroids (such as triamcinolone, fluocinolone, fluocinonide, mometasone, clobetasol, halobetasol, betamethasone, hydrocortisone) can cause thinning and lightening of the skin if they are used for too long in the same area. Your physician has selected the right strength medicine for your problem and area affected on the body. Please use your medication only as directed  by your physician to prevent side effects.    Ordered Medications: Fluocinolone Acetonide Body 0.01 % OIL  Psoriasis (2) Left elbow; Right elbow  Restart clobetasol ointment twice daily for up to two weeks then weekends only.   Topical steroids (such as triamcinolone, fluocinolone, fluocinonide, mometasone, clobetasol, halobetasol, betamethasone, hydrocortisone) can cause thinning and lightening of the skin if they are used for too long in the same area. Your physician has selected the right strength medicine for your problem and area affected on the body. Please use your medication only as directed by your physician to prevent side effects.    Tinea cruris groin  /Tinea corporis   Restart ciclopirox cream daily.  Pending labs start terbinafine 250mg  once daily for 7 days then repeat at beginning of months 3, 5 and 7 (to clear onychomycosis)   terbinafine (LAMISIL) 250 MG tablet - groin  ciclopirox (LOPROX) 0.77 % cream - groin  CMP - groin  CBC with Differential/Platelets - groin  Onychomycosis Right Hallux Toe Nail Plate  Chronic, not at goal  Recurrent tinea cruris likely secondary to onychomycosis  Pending normal labs plan terbinafine 250mg  once daily for 7 days then repeat at beginning of months 3, 5 and 7.   Return in about 1 month (around 05/27/2020).  Graciella Belton, RMA, am acting as scribe for Forest Gleason, MD .  Documentation: I have reviewed the above documentation for accuracy and completeness, and I agree with the above.  Forest Gleason, MD

## 2020-04-28 LAB — CBC WITH DIFFERENTIAL/PLATELET
Basophils Absolute: 0 10*3/uL (ref 0.0–0.2)
Basos: 1 %
EOS (ABSOLUTE): 0.1 10*3/uL (ref 0.0–0.4)
Eos: 1 %
Hematocrit: 41.6 % (ref 34.0–46.6)
Hemoglobin: 13.2 g/dL (ref 11.1–15.9)
Immature Grans (Abs): 0 10*3/uL (ref 0.0–0.1)
Immature Granulocytes: 0 %
Lymphocytes Absolute: 1.6 10*3/uL (ref 0.7–3.1)
Lymphs: 32 %
MCH: 26.1 pg — ABNORMAL LOW (ref 26.6–33.0)
MCHC: 31.7 g/dL (ref 31.5–35.7)
MCV: 82 fL (ref 79–97)
Monocytes Absolute: 0.5 10*3/uL (ref 0.1–0.9)
Monocytes: 9 %
Neutrophils Absolute: 3 10*3/uL (ref 1.4–7.0)
Neutrophils: 57 %
Platelets: 252 10*3/uL (ref 150–450)
RBC: 5.05 x10E6/uL (ref 3.77–5.28)
RDW: 13.9 % (ref 11.7–15.4)
WBC: 5.2 10*3/uL (ref 3.4–10.8)

## 2020-04-28 LAB — COMPREHENSIVE METABOLIC PANEL
ALT: 20 IU/L (ref 0–32)
AST: 15 IU/L (ref 0–40)
Albumin/Globulin Ratio: 2.1 (ref 1.2–2.2)
Albumin: 4.7 g/dL (ref 3.8–4.8)
Alkaline Phosphatase: 118 IU/L (ref 48–121)
BUN/Creatinine Ratio: 11 — ABNORMAL LOW (ref 12–28)
BUN: 9 mg/dL (ref 8–27)
Bilirubin Total: 0.8 mg/dL (ref 0.0–1.2)
CO2: 27 mmol/L (ref 20–29)
Calcium: 10.2 mg/dL (ref 8.7–10.3)
Chloride: 97 mmol/L (ref 96–106)
Creatinine, Ser: 0.85 mg/dL (ref 0.57–1.00)
GFR calc Af Amer: 83 mL/min/{1.73_m2} (ref >59)
GFR calc non Af Amer: 72 mL/min/{1.73_m2} (ref >59)
Globulin, Total: 2.2 g/dL (ref 1.5–4.5)
Glucose: 153 mg/dL — ABNORMAL HIGH (ref 65–99)
Potassium: 3.7 mmol/L (ref 3.5–5.2)
Sodium: 137 mmol/L (ref 134–144)
Total Protein: 6.9 g/dL (ref 6.0–8.5)

## 2020-05-02 ENCOUNTER — Encounter: Payer: Self-pay | Admitting: Student in an Organized Health Care Education/Training Program

## 2020-05-02 ENCOUNTER — Other Ambulatory Visit: Payer: Self-pay

## 2020-05-02 ENCOUNTER — Ambulatory Visit
Payer: Medicare HMO | Attending: Student in an Organized Health Care Education/Training Program | Admitting: Student in an Organized Health Care Education/Training Program

## 2020-05-02 VITALS — BP 143/89 | HR 81 | Resp 18 | Ht 71.0 in | Wt 247.0 lb

## 2020-05-02 DIAGNOSIS — Z9889 Other specified postprocedural states: Secondary | ICD-10-CM | POA: Diagnosis not present

## 2020-05-02 DIAGNOSIS — M5416 Radiculopathy, lumbar region: Secondary | ICD-10-CM | POA: Diagnosis not present

## 2020-05-02 DIAGNOSIS — M5137 Other intervertebral disc degeneration, lumbosacral region: Secondary | ICD-10-CM | POA: Insufficient documentation

## 2020-05-02 DIAGNOSIS — M961 Postlaminectomy syndrome, not elsewhere classified: Secondary | ICD-10-CM | POA: Diagnosis not present

## 2020-05-02 DIAGNOSIS — M545 Low back pain, unspecified: Secondary | ICD-10-CM

## 2020-05-02 DIAGNOSIS — G894 Chronic pain syndrome: Secondary | ICD-10-CM | POA: Insufficient documentation

## 2020-05-02 MED ORDER — GABAPENTIN 300 MG PO CAPS: 300.0000 mg | ORAL_CAPSULE | Freq: Three times a day (TID) | ORAL | 5 refills | Status: DC

## 2020-05-02 MED ORDER — TRAMADOL HCL 50 MG PO TABS: 50.0000 mg | ORAL_TABLET | Freq: Three times a day (TID) | ORAL | 2 refills | Status: DC | PRN

## 2020-05-02 NOTE — Progress Notes (Signed)
PROVIDER NOTE: Information contained herein reflects review and annotations entered in association with encounter. Interpretation of such information and data should be left to medically-trained personnel. Information provided to patient can be located elsewhere in the medical record under "Patient Instructions". Document created using STT-dictation technology, any transcriptional errors that may result from process are unintentional.    Patient: Christine Higgins  Service Category: E/M  Provider: [name], MD  DOB: [birth year]  DOS: 05/02/2020  Specialty: Interventional Pain Management  MRN: [id number]  Setting: Ambulatory outpatient  PCP: Kirk Ruths, MD  Type: Established Patient    Referring Provider: Kirk Ruths, MD  Location: Office  Delivery: Face-to-face     HPI  Reason for encounter: Christine Higgins, a 64 y.o. year old female, is here today for evaluation and management of her History of lumbar laminectomy for spinal cord decompression [Z98.890]. Christine Higgins primary complain today is Back Pain (low) Last encounter: Practice (03/29/2020). My last encounter with her was on 03/27/2020. Pertinent problems: Christine Higgins has Failed back surgical syndrome; DDD (degenerative disc disease), lumbosacral; Severe obesity (BMI 35.0-35.9 with comorbidity) ([place]); Primary osteoarthritis of right knee; Primary osteoarthritis of left knee; DM II (diabetes mellitus, type II), controlled ([place]); Chronic pain syndrome; Fibromyalgia; and Lumbar radiculopathy on their pertinent problem list. Pain Assessment: Severity of Chronic pain is reported as a 8 /10. Location: Back Lower/radiates down both legs on the side down to 1st 3 toes. Onset: More than a month ago. Quality: Aching, Throbbing. Timing: Constant. Modifying factor(s): pain meds. ice. Vitals:  height is $RemoveB'5\' 11"'jHKYUMFH$  (1.803 m) and weight is 247 lb (112 kg). Her blood pressure is 143/89 (abnormal) and her pulse is 81. Her respiration is 18 and oxygen  saturation is 100%.   Patient presents today for medication management and to discuss spinal cord stimulator trial.  She continues to have persistent low back pain as well as bilateral leg pain, left greater than right related to failed back surgical syndrome, postlaminectomy pain syndrome.  She has received multiple lumbar epidural steroid injections in the past at L5-S1 which provided moderate to significant benefit for a short period of time.  Patient states that her pain is getting worse to the point that she is having trouble performing ADLs.  She is considering a home health aide given reduced functional status secondary to her pain.  In regards to medications, will refill her tramadol and gabapentin below.  Please see below for assessment and plan regarding details and plan for spinal cord stimulator trial.  Pharmacotherapy Assessment   04/22/2020  1   02/02/2020  Tramadol Hcl 50 MG Tablet  90.00  30 Bi Lat   [id number]   [place] (2541)   2/2  15.00 MME  Medicare   [facility]      Analgesic: Tramadol 50 mg 3 times daily as needed, quantity 90/month; MME equals 15   Monitoring: [place] PMP: PDMP not reviewed this encounter.       Pharmacotherapy: No side-effects or adverse reactions reported. Compliance: No problems identified. Effectiveness: Clinically acceptable.  Dewayne Shorter, RN  05/02/2020  1:44 PM  Signed Nursing Pain Medication Assessment:  Safety precautions to be maintained throughout the outpatient stay will include: orient to surroundings, keep bed in low position, maintain call bell within reach at all times, provide assistance with transfer out of bed and ambulation.  Medication Inspection Compliance: Christine Higgins did not comply with our request to bring her pills to be counted. She was reminded  that bringing the medication bottles, even when empty, is a requirement.  Medication: None brought in. Pill/Patch Count: None available to be counted. Bottle Appearance: No container available. Did not  bring bottle(s) to appointment. Filled Date: N/A Last Medication intake:  Today    UDS:  Summary  Date Value Ref Range Status  09/29/2019 Note  Final    Comment:    ==================================================================== Compliance Drug Analysis, Ur ==================================================================== Test                             Result       Flag       Units Drug Present and Declared for Prescription Verification   7-aminoclonazepam              65           EXPECTED   ng/mg creat    7-aminoclonazepam is an expected metabolite of clonazepam. Source of    clonazepam is a scheduled prescription medication.   Tramadol                       >4310        EXPECTED   ng/mg creat   O-Desmethyltramadol            >4310        EXPECTED   ng/mg creat   N-Desmethyltramadol            3447         EXPECTED   ng/mg creat    Source of tramadol is a prescription medication. O-desmethyltramadol    and N-desmethyltramadol are expected metabolites of tramadol.   Gabapentin                     PRESENT      EXPECTED   Cyclobenzaprine                PRESENT      EXPECTED   Desmethylcyclobenzaprine       PRESENT      EXPECTED    Desmethylcyclobenzaprine is an expected metabolite of    cyclobenzaprine.   Duloxetine                     PRESENT      EXPECTED   Trazodone                      PRESENT      EXPECTED   1,3 chlorophenyl piperazine    PRESENT      EXPECTED    1,3-chlorophenyl piperazine is an expected metabolite of trazodone.   Diphenhydramine                PRESENT      EXPECTED   Propranolol                    PRESENT      EXPECTED Drug Absent but Declared for Prescription Verification   Oxycodone                      Not Detected UNEXPECTED ng/mg creat   Acetaminophen                  Not Detected UNEXPECTED    Acetaminophen, as indicated in the declared medication list, is not    always detected even when used as directed.  Hydroxyzine                     Not Detected UNEXPECTED ==================================================================== Test                      Result    Flag   Units      Ref Range   Creatinine              116              mg/dL      >=91 ==================================================================== Declared Medications:  The flagging and interpretation on this report are based on the  following declared medications.  Unexpected results may arise from  inaccuracies in the declared medications.  **Note: The testing scope of this panel includes these medications:  Clonazepam  Cyclobenzaprine (Flexeril)  Diphenhydramine (Benadryl)  Duloxetine (Cymbalta)  Gabapentin (Neurontin)  Hydroxyzine  Oxycodone (Roxicodone)  Propranolol (Inderal)  Tramadol (Ultram)  Trazodone (Desyrel)  **Note: The testing scope of this panel does not include small to  moderate amounts of these reported medications:  Acetaminophen  **Note: The testing scope of this panel does not include the  following reported medications:  Apixaban  Atorvastatin (Lipitor)  Betamethasone (Lotrisone)  Budesonide (Symbicort)  Calcium  Ciprofloxacin (Cipro)  Clobetasol  Clotrimazole (Lotrisone)  Esomeprazole (Nexium)  Eye Drops  Formoterol (Symbicort)  Hydralazine (Apresoline)  Hydrochlorothiazide (Hyzaar)  Iron  Losartan (Hyzaar)  Menthol  Multivitamin  Probiotic  Sodium Chloride  Sucralfate (Carafate)  Turmeric  Ubiquinone (CoQ10)  Vitamin B12  Vitamin C  Vitamin D3  Vitamin E ==================================================================== For clinical consultation, please call [phone]. ====================================================================      ROS  Constitutional: Denies any fever or chills Gastrointestinal: No reported hemesis, hematochezia, vomiting, or acute GI distress Musculoskeletal: Low back, bilateral leg pain Neurological: No reported episodes of acute onset apraxia, aphasia,  dysarthria, agnosia, amnesia, paralysis, loss of coordination, or loss of consciousness  Medication Review  Acetaminophen, Apremilast, CoQ-10, DULoxetine, EPINEPHrine, Fluocinolone Acetonide Body, Menthol (Topical Analgesic), Multiple Vitamins-Minerals, Prenatal MV-Min-Fe Fum-FA-DHA, Probiotic Product, Propylene Glycol, Turmeric, Vitamin B-12, Vitamin D3, Vitamin E, atorvastatin, budesonide-formoterol, calcium carbonate, celecoxib, ciclopirox, clobetasol cream, clobetasol ointment, clonazePAM, clotrimazole-betamethasone, cyclobenzaprine, diphenhydrAMINE, esomeprazole, fluticasone, gabapentin, hydrALAZINE, hydrOXYzine, hydrocortisone, ketoconazole, linaclotide, losartan-hydrochlorothiazide, methimazole, ofloxacin, propranolol, sucralfate, terbinafine, traMADol, traZODone, vitamin A, and vitamin C  History Review  Allergy: Christine Higgins is allergic to cephalexin, ibuprofen, shellfish allergy, and aspirin. Drug: Christine Higgins  reports previous drug use. Drugs: Marijuana and Cocaine. Alcohol:  reports no history of alcohol use. Tobacco:  reports that she quit smoking about 27 years ago. She has never used smokeless tobacco. Social: Christine Higgins  reports that she quit smoking about 27 years ago. She has never used smokeless tobacco. She reports previous drug use. Drugs: Marijuana and Cocaine. She reports that she does not drink alcohol. Medical:  has a past medical history of Anxiety, Asthma, Breast mass, Cervical disc disease, Chronic pain syndrome, Degenerative disc disease, lumbar, Depression, Fibromyalgia, Fibromyalgia, GERD (gastroesophageal reflux disease), Glaucoma, Graves disease, Hemorrhoids, Hyperlipidemia, Hypertension, and Thyroid disease. Surgical: Christine Higgins  has a past surgical history that includes carpel tunn (Right); Hand surgery (Right); carpel tunnel (Left); Cesarean section; Shoulder surgery (Right); Back surgery; Neck surgery; Total hip arthroplasty (Right); Excision Morton's neuroma (Left,  02/05/2017); Colonoscopy; Joint replacement; Esophagogastroduodenoscopy (egd) with propofol (N/A, 06/26/2017); Knee Arthroplasty (Left, 08/11/2017); Knee arthroscopy (Right, 07/02/2018); and Reverse shoulder arthroplasty (Left, 06/24/2019). Family: family history includes Cancer in her mother; Heart  disease in her father.  Laboratory Chemistry Profile   Renal Lab Results  Component Value Date   BUN 9 04/27/2020   CREATININE 0.85 04/27/2020   BCR 11 (L) 04/27/2020   GFRAA 83 04/27/2020   GFRNONAA 72 04/27/2020     Hepatic Lab Results  Component Value Date   AST 15 04/27/2020   ALT 20 04/27/2020   ALBUMIN 4.7 04/27/2020   ALKPHOS 118 04/27/2020   LIPASE 27 12/14/2016     Electrolytes Lab Results  Component Value Date   NA 137 04/27/2020   K 3.7 04/27/2020   CL 97 04/27/2020   CALCIUM 10.2 04/27/2020   MG 1.9 08/27/2014     Bone No results found for: VD25OH, VD125OH2TOT, MP5361WE3, XV4008QP6, 25OHVITD1, 25OHVITD2, 25OHVITD3, TESTOFREE, TESTOSTERONE   Inflammation (CRP: Acute Phase) (ESR: Chronic Phase) Lab Results  Component Value Date   CRP <0.8 08/01/2017   ESRSEDRATE 11 08/01/2017       Note: Above Lab results reviewed.  Recent Imaging Review  MR LUMBAR SPINE WO CONTRAST CLINICAL DATA:  Chronic back pain  EXAM: MRI THORACIC AND LUMBAR SPINE WITHOUT CONTRAST  TECHNIQUE: Multiplanar and multiecho pulse sequences of the thoracic and lumbar spine were obtained without intravenous contrast.  COMPARISON:  2019  FINDINGS: MRI THORACIC SPINE  Alignment: Unchanged trace anterolisthesis at T11-T12. Anteroposterior alignment is otherwise maintained.  Vertebrae: No evidence of recent compression deformity. No suspicious osseous lesion. Minor degenerative endplate marrow changes.  Cord:  No abnormal signal.  Paraspinal and other soft tissues: Unremarkable.  Disc levels: Mild multilevel degenerative disc disease and facet arthropathy with ligamentum flavum  thickening. Small disc bulges are present throughout. There is moderate canal stenosis at T11-T12 and mild canal stenosis at T10-T11. Canal narrowing is otherwise minor. Neural foraminal narrowing is present at mid lower thoracic levels, greatest on the right at T11-T12 (moderate to marked) and T12-L1 (moderate) similar to prior MRI lumbar spine.  MRI LUMBAR SPINE  Segmentation:  Standard.  Alignment:  No significant anteroposterior listhesis.  Vertebrae: Stable vertebral body heights. No significant marrow edema. No suspicious osseous lesion.  Conus medullaris and cauda equina: Conus extends to the L1 level. Conus and cauda equina appear normal.  Paraspinal and other soft tissues: Chronic postoperative changes.  Disc levels:  L1-L2:  Facet arthropathy.  No canal or foraminal stenosis.  L2-L3: Disc bulge. Facet arthropathy with ligamentum flavum infolding. Mild foraminal stenosis.  L3-L4: Prior posterior decompression. Disc bulge with left foraminal annular fissure and protrusion. Facet arthropathy. No canal stenosis. Mild right and marked left foraminal stenosis.  L4-L5: Disc bulge. Facet arthropathy with ligamentum flavum infolding. Moderate canal stenosis with narrowing of the lateral recesses. Mild to moderate foraminal stenosis.  L5-S1: Disc bulge. Facet arthropathy with ligamentum flavum infolding. Mild canal stenosis. Mild to moderate foraminal stenosis  IMPRESSION: Multilevel degenerative changes of the thoracic spine. Canal and neural foraminal narrowing are greatest at T11-T12 similar to prior lumbar spine study.  Multilevel degenerative changes of the lumbar spine without significant progression since 2019. Canal stenosis remains greatest at L4-L5. Foraminal stenosis is greatest on the left at L3-L4.  Electronically Signed   By: Macy Mis M.D.   On: 04/17/2020 12:33 MR THORACIC SPINE WO CONTRAST CLINICAL DATA:  Chronic back pain  EXAM: MRI  THORACIC AND LUMBAR SPINE WITHOUT CONTRAST  TECHNIQUE: Multiplanar and multiecho pulse sequences of the thoracic and lumbar spine were obtained without intravenous contrast.  COMPARISON:  2019  FINDINGS: MRI THORACIC SPINE  Alignment: Unchanged  trace anterolisthesis at T11-T12. Anteroposterior alignment is otherwise maintained.  Vertebrae: No evidence of recent compression deformity. No suspicious osseous lesion. Minor degenerative endplate marrow changes.  Cord:  No abnormal signal.  Paraspinal and other soft tissues: Unremarkable.  Disc levels: Mild multilevel degenerative disc disease and facet arthropathy with ligamentum flavum thickening. Small disc bulges are present throughout. There is moderate canal stenosis at T11-T12 and mild canal stenosis at T10-T11. Canal narrowing is otherwise minor. Neural foraminal narrowing is present at mid lower thoracic levels, greatest on the right at T11-T12 (moderate to marked) and T12-L1 (moderate) similar to prior MRI lumbar spine.  MRI LUMBAR SPINE  Segmentation:  Standard.  Alignment:  No significant anteroposterior listhesis.  Vertebrae: Stable vertebral body heights. No significant marrow edema. No suspicious osseous lesion.  Conus medullaris and cauda equina: Conus extends to the L1 level. Conus and cauda equina appear normal.  Paraspinal and other soft tissues: Chronic postoperative changes.  Disc levels:  L1-L2:  Facet arthropathy.  No canal or foraminal stenosis.  L2-L3: Disc bulge. Facet arthropathy with ligamentum flavum infolding. Mild foraminal stenosis.  L3-L4: Prior posterior decompression. Disc bulge with left foraminal annular fissure and protrusion. Facet arthropathy. No canal stenosis. Mild right and marked left foraminal stenosis.  L4-L5: Disc bulge. Facet arthropathy with ligamentum flavum infolding. Moderate canal stenosis with narrowing of the lateral recesses. Mild to moderate foraminal  stenosis.  L5-S1: Disc bulge. Facet arthropathy with ligamentum flavum infolding. Mild canal stenosis. Mild to moderate foraminal stenosis  IMPRESSION: Multilevel degenerative changes of the thoracic spine. Canal and neural foraminal narrowing are greatest at T11-T12 similar to prior lumbar spine study.  Multilevel degenerative changes of the lumbar spine without significant progression since 2019. Canal stenosis remains greatest at L4-L5. Foraminal stenosis is greatest on the left at L3-L4.  Electronically Signed   By: Macy Mis M.D.   On: 04/17/2020 12:33 Note: Reviewed        Physical Exam  General appearance: Well nourished, well developed, and well hydrated. In no apparent acute distress Mental status: Alert, oriented x 3 (person, place, & time)       Respiratory: No evidence of acute respiratory distress Eyes: PERLA Vitals: BP (!) 143/89   Pulse 81   Resp 18   Ht $R'5\' 11"'EC$  (1.803 m)   Wt 247 lb (112 kg)   SpO2 100%   BMI 34.45 kg/m  BMI: Estimated body mass index is 34.45 kg/m as calculated from the following:   Height as of this encounter: $RemoveBeforeD'5\' 11"'boVQrkMcpODBOc$  (1.803 m).   Weight as of this encounter: 247 lb (112 kg). Ideal: Ideal body weight: 70.8 kg (156 lb 1.4 oz) Adjusted ideal body weight: 87.3 kg (192 lb 7.2 oz)  Lumbar Spine Area Exam  Skin & Axial Inspection:Well healed scar from previous spine surgery detected Alignment:Symmetrical Functional SWH:QPRF restricted ROM Stability:No instability detected Muscle Tone/Strength:Functionally intact. No obvious neuro-muscular anomalies detected. Sensory (Neurological):Dermatomal pain pattern  Positive straight leg raise test bilaterally  Lower Extremity Exam    Side:Right lower extremity  Side:Left lower extremity  Stability:No instability observed  Stability:No instability observed  Skin & Extremity Inspection:Skin color, temperature, and hair growth are WNL. No peripheral edema or  cyanosis. No masses, redness, swelling, asymmetry, or associated skin lesions. No contractures.  Skin & Extremity Inspection:Skin color, temperature, and hair growth are WNL. No peripheral edema or cyanosis. No masses, redness, swelling, asymmetry, or associated skin lesions. No contractures.  Functional FMB:WGYKZLDJTTSV ROM   Functional XBL:TJQZ restricted ROMfor  hip and knee joints   Muscle Tone/Strength:Functionally intact. No obvious neuro-muscular anomalies detected.  Muscle Tone/Strength:Functionally intact. No obvious neuro-muscular anomalies detected.  Sensory (Neurological):Unimpaired  Sensory (Neurological):Dermatomal pain pattern  DTR: Patellar:deferred today Achilles:deferred today Plantar:deferred today  DTR: Patellar:deferred today Achilles:deferred today Plantar:deferred today  Palpation:No palpable anomalies  Palpation:No palpable anomalies    Assessment   Status Diagnosis  Persistent Persistent Persistent 1. History of lumbar laminectomy for spinal cord decompression   2. Lumbar radiculopathy   3. Chronic pain syndrome   4. Failed back surgical syndrome   5. DDD (degenerative disc disease), lumbosacral   6. Back pain at L4-L5 level       Plan of Care   Christine Higgins has a current medication list which includes the following long-term medication(s): atorvastatin, budesonide-formoterol, calcium carbonate, clonazepam, diphenhydramine, duloxetine, duloxetine, fluticasone, gabapentin, hydralazine, linaclotide, losartan-hydrochlorothiazide, methimazole, propranolol, and trazodone.   I had extensive discussion with the patient regarding risks and benefits of thoracolumbar spinal cord stimulation.  Patient has a diagnosis of failed back surgical syndrome, postlaminectomy pain syndrome with persistent lumbar radicular pain, left greater than right.  She has failed multiple lumbar epidural steroid injections with  me and continues to have a decline in her functional status.  She has trouble performing ADLs.  We have discussed spinal cord stimulation.  She has completed psych visit which was appropriate and is even continuing with Dr. Gibson Ramp for pain coping strategies for chronic pain management.  She has also completed her lumbar and thoracic MRI.  I had an extensive discussion with the patient regarding her risks from the procedure in the context of her thoracic spine MRI.  Of note, patient's thoracic MRI shows moderate canal stenosis at T11-T12 and mild canal stenosis at T10-T11.  She also has neuroforaminal narrowing at right T11-T12 which is moderate and T12-L1 which is moderate as well.  In regards to her lower lumbar spine, patient has severe canal stenosis at L4-L5 as well as foraminal stenosis left greater than right at L3-L4.  In regards to procedural risks, I informed the patient that she has a higher risk of adverse events from her spinal cord stimulator trial in the context of her moderate canal stenosis at T11-T12.  I informed the patient that I would still consider her for a trial but would have a low threshold to place a single lead or abort the procedure if he displays any signs of thoracic compression.  I informed the patient that we may only be able to get in 1-lead depending upon her thoracic canal stenosis.  Patient has fairly limited options at this time and understands the risks of this procedure in the context of her thoracic MRI.  Otherwise patient is not a diabetic, she is not on any blood thinners, is not a smoker.  She is morbidly obese which does carry increased risk.  Of note patient does have an allergy to Keflex so we will need to utilize clindamycin as preprocedure antibiotic prophylaxis and for her 7 days during her trial.  I discussed  percutaneous spinal cord stimulator trial with the patient in detail. I explained to the patient that they will have an external power source and  programmer which the patient will use for 7 days. There will be daily communication with the stimulator company and the patient. A possible need for a mid-trial clinic visit to give the patient the best chance of success.   Some of patient's pain does seem to be mechanical in  nature, with some component of neurogenic pain as well. We discussed the indications for spinal cord stimulation, specifically stating that it is typically better for neuropathic and appendicular pain, but that we have had some success in the treatment of low back and hip pain.   Patient is interested in proceeding with spinal cord stimulation trial. She understands that this may not be successful, and that spinal cord stimulation in general is not a "magic bullet."   We had a lengthy and very detailed discussion of all the risks, benefits, alternatives, and rationale of surgery as well as the option of continuing nonsurgical therapies. We specifically discussed the risks of temporary or permanent worsened neurologic injury, no symptomatic relief or pain made worse after procedure, and also the need for future surgery (due to infection, CSF leak, bleeding, adjacent segment issues, bone-healing difficulties, and other related issues). No guarantees of outcome were made or implied and he is eager to proceed and presents for definitive treatment.  Patient's thoracic MRI was discussed in great detail looking at axial and sagittal images and risk assessment was made based upon her T11-T12 canal stenosis that could increase her risk of adverse events during the procedure.  Patient is aware and willing to proceed.  Christine Higgins told me that all of her questions were answered thoroughly and to her satisfaction. Confidence and understanding of the discussed risks and consequences of  treatment was expressed and she accepted these risks and was eager to proceed with procedure.   Issues concerning treatment and diagnosis were discussed with the  patient. There are no barriers to understanding the plan of treatment. Explanation was well received by patient and/or family who then verbalized understanding.    Pharmacotherapy (Medications Ordered): Meds ordered this encounter  Medications  . gabapentin (NEURONTIN) 300 MG capsule    Sig: Take 1 capsule (300 mg total) by mouth 3 (three) times daily.    Dispense:  90 capsule    Refill:  5  . traMADol (ULTRAM) 50 MG tablet    Sig: Take 1 tablet (50 mg total) by mouth every 8 (eight) hours as needed for severe pain.    Dispense:  90 tablet    Refill:  2    Fill one day early if pharmacy is closed on scheduled refill date.   Orders:  Orders Placed This Encounter  Procedures  . [facility] representative to notify them of the scheduled case and to make sure they will be available to provide required equipment.    Standing Status:   Future    Standing Expiration Date:   10/30/2020    Scheduling Instructions:     Side: Bilateral     Level: Lumbar     Device: [employer]     Sedation: With sedation     Timeframe: As soon as Emergency planning/management officer Question:   Where will this procedure be performed?    Answer:   [facility]   Follow-up plan:   Return in about 20 days (around 05/22/2020) for [place] SCS trial.     Status post right L5-S1 ESI 03/23/2019, 6 out of 8 cc injected, #2 on 04/21/19, 5 cc more concentrated, #3 on 06/14/2019: 6 cc, #4 09/29/19 left L5/S1 [place] , #5 12/08/19, #6 02/23/20. Plan for SCS trial.      Recent Visits Date Type Provider Dept  03/27/20 Office Visit [name], MD [facility]  03/08/20 Procedure  visit [name], MD [facility]  02/02/20 Office Visit [name], MD [facility]  Showing recent visits within past 90 days and meeting all other requirements Today's Visits Date Type Provider Dept  05/02/20 Office Visit [name], MD [facility]   Showing today's visits and meeting all other requirements Future Appointments Date Type Provider Dept  07/27/20 Appointment [name], MD [facility]  Showing future appointments within next 90 days and meeting all other requirements  I discussed the assessment and treatment plan with the patient. The patient was provided an opportunity to ask questions and all were answered. The patient agreed with the plan and demonstrated an understanding of the instructions.  Patient advised to call back or seek an in-person evaluation if the symptoms or condition worsens.  Duration of encounter:67minutes.  Note by: [name], MD Date: 05/02/2020; Time: 2:37 PM

## 2020-05-02 NOTE — Progress Notes (Signed)
Nursing Pain Medication Assessment:  Safety precautions to be maintained throughout the outpatient stay will include: orient to surroundings, keep bed in low position, maintain call bell within reach at all times, provide assistance with transfer out of bed and ambulation.  Medication Inspection Compliance: Christine Higgins did not comply with our request to bring her pills to be counted. She was reminded that bringing the medication bottles, even when empty, is a requirement.  Medication: None brought in. Pill/Patch Count: None available to be counted. Bottle Appearance: No container available. Did not bring bottle(s) to appointment. Filled Date: N/A Last Medication intake:  Today  

## 2020-05-02 NOTE — Patient Instructions (Signed)

## 2020-05-03 ENCOUNTER — Telehealth: Payer: Self-pay

## 2020-05-03 ENCOUNTER — Ambulatory Visit (INDEPENDENT_AMBULATORY_CARE_PROVIDER_SITE_OTHER): Payer: Medicare HMO | Admitting: Licensed Clinical Social Worker

## 2020-05-03 DIAGNOSIS — F4325 Adjustment disorder with mixed disturbance of emotions and conduct: Secondary | ICD-10-CM | POA: Diagnosis not present

## 2020-05-03 DIAGNOSIS — F325 Major depressive disorder, single episode, in full remission: Secondary | ICD-10-CM

## 2020-05-03 NOTE — Progress Notes (Signed)
Labs ok. Start  terbinafine 250mg  once daily for 7 days then repeat at beginning of months 3, 5 and 7 to treat toenail fungus and fungal rash on body.  MAs please call

## 2020-05-03 NOTE — Progress Notes (Signed)
Virtual Visit via Video Note  I connected with Christine Higgins on 05/03/20 at 10:00 AM EDT by a video enabled telemedicine application and verified that I am speaking with the correct person using two identifiers.  Location: Patient: home Provider: remote office [place], [place])   I discussed the limitations of evaluation and management by telemedicine and the availability of in person appointments. The patient expressed understanding and agreed to proceed.  The patient was advised to call back or seek an in-person evaluation if the symptoms worsen or if the condition fails to improve as anticipated.  I provided 45 minutes of non-face-to-face time during this encounter.   Tripp Goins R Katerina Zurn, LCSW    THERAPIST PROGRESS NOTE  Session Time: 10:00-10:45 am  Participation Level: Active  Behavioral Response: NeatAlertAnxious and Depressed  Type of Therapy: Individual Therapy  Treatment Goals addressed: Anxiety and Coping  Interventions: CBT and Supportive  Summary: Christine Higgins is a 64 y.o. female who presents with improving symptoms related to diagnosis of adjustment disorder (depression, anxiety). Pt reports that mood is stable and that she is managing anxiety well. Pt reports good quality and quantity of sleep. Pt reports that she is compliant with medications. Pt requests referral to psychiatrist.  Allowed pt to explore and express thoughts and feelings about upcoming court case on 9/27.  Pt is really unclear and uncertain about what the outcome will be. Pt reports that she feels scared and nervous and is worried about potential jail time.   Explored pts concern about "why" she has engaged in stealing behaviors. Reflected together life circumstances in childhood, young adulthood, and throughout adulthood. Discussed pts sense of responsibility of taking care of her daughter and grandchildren versus just herself in early adulthood. Compared and contrasted financial obligations and  how they change based on life timelines.  Pt feels she can understand how financial pressure can often trigger behaviors like stealing.   Pt reflected on her strong faith in God and how she believes that her current situation is not a negative reflection of her strong identity as a Engineer, manufacturing.  Encouraged pt to focus on self care, social supports, and life balance. Court date 9/27.   Suicidal/Homicidal: No  SI, HI, or AVH reported at time of session.  Therapist Response: Freda Munro reports a decrease in depression and anxiety symptoms, which is reflective of progress. Pt reports zero incidents of any impulsive behaviors, which is indicative of progress.   Plan: Return again in 3 weeks. Ongoing treatment plan to include mood management, anxiety management, and decreasing impulsive behaviors.   Diagnosis: Axis I: Adjustment Disorder with Mixed Emotional Features    Axis II: No diagnosis    Rachel Bo Tashae Inda, LCSW 05/03/2020

## 2020-05-03 NOTE — Telephone Encounter (Signed)
Pt left voicemail about her prescriptions not being at the pharmacy. Prescriptions were sent on 04/27/20 to [facility] on [address]. Left voicemail for patient call office back if rx needs resent or sent to different pharmacy.

## 2020-05-04 ENCOUNTER — Telehealth: Payer: Self-pay

## 2020-05-04 MED ORDER — HYDROXYZINE HCL 10 MG PO TABS
ORAL_TABLET | ORAL | 0 refills | Status: DC
Start: 2020-05-04 — End: 2020-06-19

## 2020-05-04 NOTE — Telephone Encounter (Addendum)
Patient called and informed of biopsy results, patient verbalized understanding. Patient did state taht she called yesterday about Dr. Laurence Ferrari sending in Atarax for itching.   Will send in refill for patient of Atarax. Discussed risk of drowsiness, increased risk of falling, and to take lowest dose possible for the shortest period of time.

## 2020-05-05 DIAGNOSIS — E119 Type 2 diabetes mellitus without complications: Secondary | ICD-10-CM | POA: Diagnosis not present

## 2020-05-05 DIAGNOSIS — E7800 Pure hypercholesterolemia, unspecified: Secondary | ICD-10-CM | POA: Diagnosis not present

## 2020-05-05 DIAGNOSIS — I10 Essential (primary) hypertension: Secondary | ICD-10-CM | POA: Diagnosis not present

## 2020-05-08 DIAGNOSIS — R32 Unspecified urinary incontinence: Secondary | ICD-10-CM | POA: Diagnosis not present

## 2020-05-09 ENCOUNTER — Encounter: Payer: Self-pay | Admitting: Dermatology

## 2020-05-10 ENCOUNTER — Telehealth: Payer: Self-pay

## 2020-05-10 ENCOUNTER — Other Ambulatory Visit: Payer: Self-pay | Admitting: Dermatology

## 2020-05-10 ENCOUNTER — Encounter: Payer: Self-pay | Admitting: Student in an Organized Health Care Education/Training Program

## 2020-05-10 DIAGNOSIS — R21 Rash and other nonspecific skin eruption: Secondary | ICD-10-CM

## 2020-05-10 DIAGNOSIS — L304 Erythema intertrigo: Secondary | ICD-10-CM

## 2020-05-10 NOTE — Telephone Encounter (Signed)
Patient called to let us know that she cant do the SCS trial on 9/27 which if good because the rep cant come that day either and we havent gotten prior authorization yet from insurance. Let her know once we got [employer] would give [place] a date.

## 2020-05-10 NOTE — Progress Notes (Unsigned)
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  She has completed a home exercise regimen and tries to do home stretching exercises on a daily basis.  She is limited in her ability to perform these consistently given her pain but she tries to do them as much as she can as they do help with her range of motion.

## 2020-05-25 ENCOUNTER — Ambulatory Visit: Payer: Medicare HMO | Admitting: Licensed Clinical Social Worker

## 2020-05-25 ENCOUNTER — Other Ambulatory Visit: Payer: Self-pay

## 2020-05-25 ENCOUNTER — Other Ambulatory Visit: Payer: Self-pay | Admitting: Dermatology

## 2020-05-25 DIAGNOSIS — B356 Tinea cruris: Secondary | ICD-10-CM

## 2020-05-31 ENCOUNTER — Encounter: Payer: Self-pay | Admitting: Dermatology

## 2020-05-31 ENCOUNTER — Other Ambulatory Visit: Payer: Self-pay

## 2020-05-31 ENCOUNTER — Ambulatory Visit (INDEPENDENT_AMBULATORY_CARE_PROVIDER_SITE_OTHER): Payer: Medicare HMO | Admitting: Dermatology

## 2020-05-31 DIAGNOSIS — L309 Dermatitis, unspecified: Secondary | ICD-10-CM | POA: Diagnosis not present

## 2020-05-31 DIAGNOSIS — R0600 Dyspnea, unspecified: Secondary | ICD-10-CM | POA: Diagnosis not present

## 2020-05-31 DIAGNOSIS — L280 Lichen simplex chronicus: Secondary | ICD-10-CM | POA: Diagnosis not present

## 2020-05-31 DIAGNOSIS — L219 Seborrheic dermatitis, unspecified: Secondary | ICD-10-CM

## 2020-05-31 DIAGNOSIS — B354 Tinea corporis: Secondary | ICD-10-CM

## 2020-05-31 DIAGNOSIS — J310 Chronic rhinitis: Secondary | ICD-10-CM | POA: Diagnosis not present

## 2020-05-31 DIAGNOSIS — J449 Chronic obstructive pulmonary disease, unspecified: Secondary | ICD-10-CM | POA: Diagnosis not present

## 2020-05-31 DIAGNOSIS — B351 Tinea unguium: Secondary | ICD-10-CM

## 2020-05-31 NOTE — Progress Notes (Signed)
   Follow-Up Visit   Subjective  Christine Higgins is a 64 y.o. female who presents for the following: Follow-up (Patient here today for 1 month follow up.).  Patient using fluocinoline oil twice daily for seborrheic dermatitis which is improving. Also following up for psoriasis. Patient using clobetasol 0.05% ointment twice daily. Elbows have improved. She is also following up for tinea cruris. She is using ciclopirox and taking pulse dose terbinafine. After she finished the first week of month one she cleared so she stopped ciclopirox but rash has come back.  The following portions of the chart were reviewed this encounter and updated as appropriate:  Tobacco  Allergies  Meds  Problems  Med Hx  Surg Hx  Fam Hx      Review of Systems:  No other skin or systemic complaints except as noted in HPI or Assessment and Plan.  Objective  Well appearing patient in no apparent distress; mood and affect are within normal limits.  A focused examination was performed including arms, scalp, trunk, feet, toenails. Relevant physical exam findings are noted in the Assessment and Plan.  Objective  Scalp: Minimal erythema and mild scale  Objective  Abdomen: Hyperpigmented plaque abdomen left of umbilicus  Objective  forearms: Significantly improved  Objective  Right Abdomen (side) - Upper: Well demarcated scaly pink plaques inframammary  Objective  Right Hallux Toe Nail Plate: Toenails thickened with subungual debris   Assessment & Plan  Seborrheic dermatitis Scalp  Chronic, improving but not at goal  Continue fluocinolone oil increasing to twice daily until itch resolved then daily as needed.   Other Related Medications Fluocinolone Acetonide Body 0.01 % OIL  Dermatitis Abdomen  Start clobetasol 0.05% ointment twice daily to affected areas at abdomen for 2 weeks then weekends only if not totally clear.   Unclear etiology.  Denies wearing belts or pants with buttons or  snaps.  Consider patch testing if not clearing well with topical steroid.  Patient will call if this spreads to involve other areas.  Lichen simplex chronicus forearms  Continue clobetasol 0.05% ointment twice daily on weekends only to elbows. Avoid rubbing, scratching, pressure as much as possible.  Topical steroids (such as triamcinolone, fluocinolone, fluocinonide, mometasone, clobetasol, halobetasol, betamethasone, hydrocortisone) can cause thinning and lightening of the skin if they are used for too long in the same area. Your physician has selected the right strength medicine for your problem and area affected on the body. Please use your medication only as directed by your physician to prevent side effects.    Tinea corporis Right Abdomen (side) - Upper  Cleared with terbinafine and then recurred.  Restart terbinafine 250 mg daily for 7 days (part of her pulse treatment for onychomycosis). Restart ciclopirox cream daily for prevention to inguinal folds, lower abdomen and inframammary area and continue until onychomycosis cleared.  Onychomycosis Right Hallux Toe Nail Plate  Chronic, improving but not at goal. Continue terbinafine 250mg  moving her second "pulse" to now. Take one by mouth for 1 week now then repeat in 2 months, then at 4 months.   Return in about 2 months (around 07/31/2020).  Graciella Belton, RMA, am acting as scribe for Forest Gleason, MD .  Documentation: I have reviewed the above documentation for accuracy and completeness, and I agree with the above.  Forest Gleason, MD

## 2020-05-31 NOTE — Patient Instructions (Addendum)
Restart terbinafine 250mg  one pill daily for 1 week.  Repeat for 1 week the first week of December and again the first week in February.  Continue fluocinolone oil twice daily to scalp until itch resolved then daily as needed.   Continue clobetasol 0.05% ointment twice daily on weekends to forearms. Avoid rubbing, scratching, pressure.   Start clobetasol 0.05% ointment twice daily to affected areas at abdomen for 2 weeks then weekends only if not totally clear.   Topical steroids (such as triamcinolone, fluocinolone, fluocinonide, mometasone, clobetasol, halobetasol, betamethasone, hydrocortisone) can cause thinning and lightening of the skin if they are used for too long in the same area. Your physician has selected the right strength medicine for your problem and area affected on the body. Please use your medication only as directed by your physician to prevent side effects.   Terbinafine Counseling  Terbinafine is an anti-fungal medicine that can be applied to the skin (over the counter) or taken by mouth (prescription) to treat fungal infections. The pill version is often used to treat fungal infections of the nails or scalp. While most people do not have any side effects from taking terbinafine pills, some possible side effects of the medicine can include taste changes, headache, loss of smell, vision changes, nausea, vomiting, or diarrhea.   Rare side effects can include irritation of the liver, allergic reaction, or decrease in blood counts (which may show up as not feeling well or developing an infection). If you are concerned about any of these side effects, please stop the medicine and call your doctor, or in the case of an emergency such as feeling very unwell, seek immediate medical care.

## 2020-06-09 DIAGNOSIS — H25812 Combined forms of age-related cataract, left eye: Secondary | ICD-10-CM | POA: Diagnosis not present

## 2020-06-09 DIAGNOSIS — H2512 Age-related nuclear cataract, left eye: Secondary | ICD-10-CM | POA: Diagnosis not present

## 2020-06-14 DIAGNOSIS — M25561 Pain in right knee: Secondary | ICD-10-CM | POA: Diagnosis not present

## 2020-06-14 DIAGNOSIS — G8929 Other chronic pain: Secondary | ICD-10-CM | POA: Diagnosis not present

## 2020-06-14 DIAGNOSIS — M1711 Unilateral primary osteoarthritis, right knee: Secondary | ICD-10-CM | POA: Diagnosis not present

## 2020-06-14 DIAGNOSIS — M25461 Effusion, right knee: Secondary | ICD-10-CM | POA: Diagnosis not present

## 2020-06-16 DIAGNOSIS — R32 Unspecified urinary incontinence: Secondary | ICD-10-CM | POA: Diagnosis not present

## 2020-06-19 ENCOUNTER — Other Ambulatory Visit: Payer: Self-pay

## 2020-06-19 DIAGNOSIS — H25812 Combined forms of age-related cataract, left eye: Secondary | ICD-10-CM | POA: Diagnosis not present

## 2020-06-19 DIAGNOSIS — L299 Pruritus, unspecified: Secondary | ICD-10-CM

## 2020-06-19 MED ORDER — HYDROXYZINE HCL 10 MG PO TABS: ORAL_TABLET | ORAL | 0 refills | Status: DC

## 2020-06-19 NOTE — Progress Notes (Signed)
rx refill

## 2020-06-26 ENCOUNTER — Other Ambulatory Visit: Payer: Self-pay | Admitting: *Deleted

## 2020-06-26 ENCOUNTER — Ambulatory Visit
Admission: RE | Admit: 2020-06-26 | Discharge: 2020-06-26 | Disposition: A | Discharge status: Home / Self Care | Payer: Medicare HMO | Source: Ambulatory Visit | Attending: Student in an Organized Health Care Education/Training Program | Admitting: Student in an Organized Health Care Education/Training Program

## 2020-06-26 ENCOUNTER — Other Ambulatory Visit: Payer: Self-pay

## 2020-06-26 ENCOUNTER — Ambulatory Visit (HOSPITAL_BASED_OUTPATIENT_CLINIC_OR_DEPARTMENT_OTHER): Payer: Medicare HMO | Admitting: Student in an Organized Health Care Education/Training Program

## 2020-06-26 ENCOUNTER — Encounter: Payer: Self-pay | Admitting: Student in an Organized Health Care Education/Training Program

## 2020-06-26 DIAGNOSIS — G894 Chronic pain syndrome: Secondary | ICD-10-CM | POA: Diagnosis not present

## 2020-06-26 DIAGNOSIS — M5416 Radiculopathy, lumbar region: Secondary | ICD-10-CM | POA: Insufficient documentation

## 2020-06-26 DIAGNOSIS — M961 Postlaminectomy syndrome, not elsewhere classified: Secondary | ICD-10-CM | POA: Insufficient documentation

## 2020-06-26 DIAGNOSIS — Z299 Encounter for prophylactic measures, unspecified: Secondary | ICD-10-CM | POA: Diagnosis not present

## 2020-06-26 DIAGNOSIS — Z9889 Other specified postprocedural states: Secondary | ICD-10-CM

## 2020-06-26 MED ORDER — CEFAZOLIN SODIUM 1 G IJ SOLR
INTRAMUSCULAR | Status: AC
  Filled 2020-06-26: qty 20

## 2020-06-26 MED ORDER — CEPHALEXIN 500 MG PO CAPS: 500.0000 mg | ORAL_CAPSULE | Freq: Four times a day (QID) | ORAL | 0 refills | Status: AC

## 2020-06-26 MED ORDER — LIDOCAINE HCL 2 % IJ SOLN
20.0000 mL | Freq: Once | INTRAMUSCULAR | Status: AC
  Administered 2020-06-26: 400 mg

## 2020-06-26 MED ORDER — FENTANYL CITRATE (PF) 100 MCG/2ML IJ SOLN
25.0000 ug | INTRAMUSCULAR | Status: DC | PRN
  Administered 2020-06-26: 75 ug via INTRAVENOUS

## 2020-06-26 MED ORDER — FENTANYL CITRATE (PF) 100 MCG/2ML IJ SOLN
INTRAMUSCULAR | Status: AC
  Filled 2020-06-26: qty 2

## 2020-06-26 MED ORDER — ROPIVACAINE HCL 2 MG/ML IJ SOLN
9.0000 mL | Freq: Once | INTRAMUSCULAR | Status: AC
  Administered 2020-06-26: 9 mL via PERINEURAL

## 2020-06-26 MED ORDER — ROPIVACAINE HCL 2 MG/ML IJ SOLN
INTRAMUSCULAR | Status: AC
  Filled 2020-06-26: qty 10

## 2020-06-26 MED ORDER — LIDOCAINE HCL 2 % IJ SOLN
INTRAMUSCULAR | Status: AC
  Filled 2020-06-26: qty 20

## 2020-06-26 MED ORDER — CEFAZOLIN SODIUM-DEXTROSE 2-4 GM/100ML-% IV SOLN
2.0000 g | Freq: Once | INTRAVENOUS | Status: AC
  Administered 2020-06-26: 2 g via INTRAVENOUS

## 2020-06-26 NOTE — Progress Notes (Signed)
Safety precautions to be maintained throughout the outpatient stay will include: orient to surroundings, keep bed in low position, maintain call bell within reach at all times, provide assistance with transfer out of bed and ambulation.  

## 2020-06-26 NOTE — Patient Instructions (Signed)
Today we did the following -We have done a Spinal Cord Stimulator Trial with [employer]  -As long as the leads are in place, do not bathe or shower. You may sponge bathe.  -While the lead is in place, please limit the bending, lifting, or twisting because the lead can move.  -The things we want to see is if your pain improves (and by what percentage), if you can do more activity (don't overdo it), and if you can use less of your "as needed" medicine. Do not stop long acting medicines like methadone, oxycontin, MS Contin, etc without checking with [place].  -It is VERY important that you pick up the antibiotics we prescribed, Keflex, on your way home from the trial and take them as prescribed(4 times a day), starting today, for as long as the lead is in place.  -The Spina Cord Stimulator Representative will be in contact with you while the lead is in place to make sure the trial goes as well as possible.  -Please contact us with any questions or concerns at any time during the trial.   -If you start running a fever over 100 degrees, have severe back pain, or new pain running down the legs, or drainage coming from the lead site, contact us immediately and/or go to the emergency room.  -Please do not restart any sort of medication that can thin your blood such as Aspirin, ibuprofen, motrin, aleve, plavix, coumadin, etc. If you aren't sure, call and ask.  -We will have you return on 11/8 to have the lead removed. If this is successful, at that point we can go over the details about the permanent implant.

## 2020-06-26 NOTE — Progress Notes (Signed)
PROVIDER NOTE: Information contained herein reflects review and annotations entered in association with encounter. Interpretation of such information and data should be left to medically-trained personnel. Information provided to patient can be located elsewhere in the medical record under "Patient Instructions". Document created using STT-dictation technology, any transcriptional errors that may result from process are unintentional.    Patient: Christine Higgins  Service Category: Procedure  Provider: [name], MD  DOB: Mar 16, 1956  DOS: 06/26/2020  Location: [facility]  MRN: [id number]  Setting: Ambulatory - outpatient  Referring Provider: [name], MD  Type: Established Patient  Specialty: Interventional Pain Management  PCP: Kirk Ruths, MD   Primary Reason for Admission: Surgical management of chronic pain condition.  Procedure:  Anesthesia, Analgesia, Anxiolysis:  Type: Trial Spinal Cord Neurostimulator Implant (Percutaneous, interlaminar, posterior epidural placement) [employer] Purpose: To determine if a permanent implant may be effective in controlling some or all of Ms. Umbaugh's chronic pain symptoms.  Region: Lumbar Level: T12-L1 Laterality: Bilateral Paramedial  Type: Moderate (Conscious) Sedation combined with Local Anesthesia Indication(s): Analgesia and Anxiety Route: Intravenous (IV) IV Access: Secured Sedation: Meaningful verbal contact was maintained at all times during the procedure  Local Anesthetic: Lidocaine 1-2%   Indications: 1. Lumbar radiculopathy   2. Chronic pain syndrome   3. History of lumbar laminectomy for spinal cord decompression   4. Failed back surgical syndrome    Pain Score: Pre-procedure: 8 /10 Post-procedure: 0-No pain/10   Pre-op Assessment:  Christine Higgins is a 64 y.o. (year old), female patient, seen today for interventional treatment. She  has a past surgical history that includes carpel tunn (Right); Hand  surgery (Right); carpel tunnel (Left); Cesarean section; Shoulder surgery (Right); Back surgery; Neck surgery; Total hip arthroplasty (Right); Excision Morton's neuroma (Left, 02/05/2017); Colonoscopy; Joint replacement; Esophagogastroduodenoscopy (egd) with propofol (N/A, 06/26/2017); Knee Arthroplasty (Left, 08/11/2017); Knee arthroscopy (Right, 07/02/2018); and Reverse shoulder arthroplasty (Left, 06/24/2019).  Initial Vital Signs:  Pulse/EKG Rate: 77ECG Heart Rate: 70 Temp: (!) 97 F (36.1 C) Resp: 18 BP: 121/81 SpO2: 100 %  BMI: Estimated body mass index is 35.43 kg/m as calculated from the following:   Height as of this encounter: 5\' 11"  (1.803 m).   Weight as of this encounter: 254 lb (115.2 kg).  Risk Assessment: Allergies: Reviewed. She is allergic to cephalexin, ibuprofen, shellfish allergy, and aspirin.  Allergy Precautions: None required Coagulopathies: Reviewed. None identified.  Blood-thinner therapy: None at this time Active Infection(s): Reviewed. None identified. Christine Higgins is afebrile  Site Confirmation: Christine Higgins was asked to confirm the procedure and laterality before marking the site, which she did. Procedure checklist: Completed Consent: Before the procedure and under the influence of no sedative(s), amnesic(s), or anxiolytics, the patient was informed of the treatment options, risks and possible complications. To fulfill our ethical and legal obligations, as recommended by the American Medical Association's Code of Ethics, I have informed the patient of my clinical impression; the nature and purpose of the treatment or procedure; the risks, benefits, and possible complications of the intervention; the alternatives, including doing nothing; the risk(s) and benefit(s) of the alternative treatment(s) or procedure(s); and the risk(s) and benefit(s) of doing nothing.  Christine Higgins was provided with information about the general risks and possible complications associated with  most interventional procedures. These include, but are not limited to: failure to achieve desired goals, infection, bleeding, organ or nerve damage, allergic reactions, paralysis, and/or death.  In addition, she was informed of those risks and  possible complications associated to this particular procedure, which include, but are not limited to: damage to the implant; failure to decrease pain; local, systemic, or serious CNS infections, intraspinal abscess with possible cord compression and paralysis, or life-threatening such as meningitis; intrathecal and/or epidural bleeding with formation of hematoma with possible spinal cord compression and permanent paralysis; organ damage; nerve injury or damage with subsequent sensory, motor, and/or autonomic system dysfunction, resulting in transient or permanent pain, numbness, and/or weakness of one or several areas of the body; allergic reactions, either minor or major life-threatening, such as anaphylactic or anaphylactoid reactions.  Furthermore, [name] was informed of those risks and complications associated with the medications. These include, but are not limited to: allergic reactions (i.e.: anaphylactic or anaphylactoid reactions); arrhythmia;  Hypotension/hypertension; cardiovascular collapse; respiratory depression and/or shortness of breath; swelling or edema; medication-induced neural toxicity; particulate matter embolism and blood vessel occlusion with resultant organ, and/or nervous system infarction and permanent paralysis.  Finally, she was informed that Medicine is not an exact science; therefore, there is also the possibility of unforeseen or unpredictable risks and/or possible complications that may result in a catastrophic outcome. The patient indicated having understood very clearly. We have given the patient no guarantees and we have made no promises. Enough time was given to the patient to ask questions, all of which were answered to the  patient's satisfaction. Christine Higgins has indicated that she wanted to continue with the procedure. Attestation: I, the ordering provider, attest that I have discussed with the patient the benefits, risks, side-effects, alternatives, likelihood of achieving goals, and potential problems during recovery for the procedure that I have provided informed consent. Date  Time: 06/26/2020  7:55 AM  Pre-Procedure Preparation:  Monitoring: As per clinic protocol. Respiration, ETCO2, SpO2, BP, heart rate and rhythm monitor placed and checked for adequate function Safety Precautions: Patient was assessed for positional comfort and pressure points before starting the procedure. Time-out: I initiated and conducted the "Time-out" before starting the procedure, as per protocol. The patient was asked to participate by confirming the accuracy of the "Time Out" information. Verification of the correct person, site, and procedure were performed and confirmed by me, the nursing staff, and the patient. "Time-out" conducted as per Joint Commission's Universal Protocol (UP.01.01.01). Time: 0847  Description of Procedure Process:   Position: Prone Target Area: Posterior epidural space Approach: Posterior percutaneous, paramedial, interlaminar approach Area Prepped: Bilateral thoraco-lumbar Region Prepping solution: ChloraPrep (2% chlorhexidine gluconate and 70% isopropyl alcohol) Safety Precautions: Safe injection practices and needle disposal techniques used. Medications properly checked for expiration dates. SDV (single dose vial) medications used. Aspiration looking for blood return and/or CSF was conducted prior to all injections. At no point did I inject any substances, as a needle was being advanced. No attempts were made at seeking any paresthesias.  Description of the Procedure: Availability of a responsible, adult driver, and NPO status confirmed. Informed consent was obtained after having discussed risks and possible  complications. An IV was started. The patient was then taken to the fluoroscopy suite, where the patient was placed in position for the procedure, over the fluoroscopy table. The patient was then monitored in the usual manner. Fluoroscopy was manipulated to obtain the best possible view of the target. Parallex error was corrected before commencing the procedure. Once a clear view of the target had been obtained, the skin and deeper tissues over the procedure site were infiltrated using lidocaine, loaded in a 10 cc luer-loc syringe with a 0.5 inch,  25-G needle. The introducer needle(s) was/were then inserted through the skin and deeper tissues. A paramidline approach was used to enter the posterior epidural space at a 30 angle, using "Loss-of-resistance Technique" with 3 ml of PF-NaCl (0.9% NSS). Correct needle placement was confirmed in the antero-posterior and lateral fluoroscopic views. The lead was gently introduced and manipulated under real-time fluoroscopy, constantly assessing for pain, discomfort, or paresthesias, until the tip rested at the desired level. Both sides were done in identical fashion. Electrode placement was tested until appropriate coverage was attained. Once the patient confirmed that the stimulation was over the desired area, the lead(s) was/were secured in place and the introducer needles removed. This was done under real-time fluoroscopy while observing the electrode tip to avoid unintended migration. The area was covered with a non-occlusive dressing and the patient transported to recovery for further programming.  Vitals:   06/26/20 1000 06/26/20 1005 06/26/20 1015 06/26/20 1025  BP: 102/76 110/76 105/66 120/64  Pulse:      Resp: 18 20 18 20   Temp:    (!) 97.4 F (36.3 C)  SpO2: 98% 99% 100% 99%  Weight:      Height:       Start Time: 0847 hrs. End Time: 0945 hrs.  Neurostimulator Details:   Lead(s):  Brand: [employer] Epidural Access Level:  T12-L1 L1-2   Lead implant:  Bilateral   No. of Electrodes/Lead:  16 16  Laterality:  Left Right  Top electrode location:  T7 T7  Model No.: [id number] E Same  Length: 50cm Same  Lot No.: [id number]  MRI compatibility:  Yes Yes   Imaging Guidance (Spinal):          Type of Imaging Technique: Fluoroscopy Guidance (Spinal) Indication(s): Assistance in needle guidance and placement for procedures requiring needle placement in or near specific anatomical locations not easily accessible without such assistance. Exposure Time: Please see nurses notes. Contrast: None used. Fluoroscopic Guidance: I was personally present during the use of fluoroscopy. "Tunnel Vision Technique" used to obtain the best possible view of the target area. Parallax error corrected before commencing the procedure. "Direction-depth-direction" technique used to introduce the needle under continuous pulsed fluoroscopy. Once target was reached, antero-posterior, oblique, and lateral fluoroscopic projection used confirm needle placement in all planes. Images permanently stored in EMR. Interpretation: No contrast injected. I personally interpreted the imaging intraoperatively. Adequate needle placement confirmed in multiple planes. Permanent images saved into the patient's record.      Antibiotic Prophylaxis:   Anti-infectives (From admission, onward)   Start     Dose/Rate Route Frequency Ordered Stop   06/26/20 0000  cephALEXin (KEFLEX) 500 MG capsule        500 mg Oral 4 times daily 06/26/20 2671 07/03/20 2359     Patient's cephalosporin allergy was discussed with her.  She states that she broke out in mild hives when this was given to her last year.  This was treated with Benadryl.  Okay to proceed with 2 g of Ancef prior to procedure and subsequently 500 mg of Keflex 4 times daily for duration of spinal cord stimulator trial  Indication(s): Procedural Prophylaxis.  Post-operative Assessment:  Post-procedure Vital Signs:   Pulse/HCG Rate: 7769 Temp: (!) 97.4 F (36.3 C) Resp: 20 BP: 120/64 SpO2: 99 %  Complications: No immediate post-treatment complications observed by team, or reported by patient.  Note: The patient tolerated the entire procedure well. A repeat set of vitals were taken after the procedure and the patient was  kept under observation following institutional policy, for this type of procedure. Post-procedural neurological assessment was performed, showing return to baseline, prior to discharge. The patient was provided with post-procedure discharge instructions, including a section on how to identify potential problems. Should any problems arise concerning this procedure, the patient was given instructions to immediately contact us, at any time, without hesitation. In any case, we plan to contact the patient by telephone for a follow-up status report regarding this interventional procedure.  Comments:  No additional relevant information.  Plan of Care  Orders:  Orders Placed This Encounter  Procedures  . [facility] C-ARM 1-60 MIN NO REPORT    Intraoperative interpretation by procedural physician at [facility].    Standing Status:   Standing    Number of Occurrences:   1    Order Specific Question:   Reason for exam:    Answer:   Assistance in needle guidance and placement for procedures requiring needle placement in or near specific anatomical locations not easily accessible without such assistance.   Chronic Opioid Analgesic:  Tramadol 50 mg 3 times daily as needed, quantity 90/month; MME equals 15   Medications administered: We administered lidocaine, ropivacaine (PF) 2 mg/mL (0.2%), and fentaNYL.  See the medical record for exact dosing, route, and time of administration.  Follow-up plan:   Return in about 1 week (around 07/03/2020) for SCS lead pull.      Status post right L5-S1 ESI 03/23/2019, 6 out of 8 cc injected, #2 on 04/21/19, 5 cc more concentrated, #3 on  06/14/2019: 6 cc, #4 09/29/19 left L5/S1 [place] , #5 12/08/19, #6 02/23/20. [employer] SCS trial 06/26/20.       Recent Visits Date Type Provider Dept  05/02/20 Office Visit [name], MD [facility]  Showing recent visits within past 90 days and meeting all other requirements Today's Visits Date Type Provider Dept  06/26/20 Procedure visit [name], MD [facility]  Showing today's visits and meeting all other requirements Future Appointments Date Type Provider Dept  07/03/20 Appointment [name], MD [facility]  07/27/20 Appointment [name], MD [facility]  Showing future appointments within next 90 days and meeting all other requirements  Disposition: Discharge home  Discharge (Date  Time): 06/26/2020; 1037 hrs.   Primary Care Physician: Kirk Ruths, MD Location: [facility] Note by: [name], MD Date: 06/26/2020; Time: 11:07 AM

## 2020-06-27 ENCOUNTER — Telehealth: Payer: Self-pay | Admitting: *Deleted

## 2020-06-27 NOTE — Telephone Encounter (Signed)
Attempted to call for post procedure follow-up. Message left. 

## 2020-06-30 ENCOUNTER — Telehealth: Payer: Self-pay

## 2020-06-30 NOTE — Telephone Encounter (Signed)
Christine Higgins called and would like a prescription sent in for itching. She states that she is itching really  bad under the tape from the SCS trial. Instructed her to wash around the area really good and could take some OTC Benadryl, which  she has and that it is not working.She is allergic to Cephalexin which causes itching but states she doesn't think it is from that because she is not iching all over and does not have a rash. Is this something you can do? If so, I will be glad to call her back .   Her current pharm is correct in the chart (11-1/21)  She states that Hydroxyzine works well for her.

## 2020-06-30 NOTE — Telephone Encounter (Signed)
Patient called and left message on machine and was requesting that prescription be sent in for itching where she had procedure done. No answer, Left message for her to have someone to wash her back off and to use something otc. Example Benadryl, MD not in office today and that she needed to call back to inform her problems.

## 2020-06-30 NOTE — Telephone Encounter (Signed)
Talked With Dr Holley Raring via telephone. Order given for Atarax 10mg  tablets po, Take up to 3 tablets PRN for itching. Dispense 21. Called and notified Ms Schuchard.

## 2020-07-03 ENCOUNTER — Ambulatory Visit
Admission: RE | Admit: 2020-07-03 | Discharge: 2020-07-03 | Disposition: A | Discharge status: Home / Self Care | Payer: Medicare HMO | Source: Ambulatory Visit | Attending: Student in an Organized Health Care Education/Training Program | Admitting: Student in an Organized Health Care Education/Training Program

## 2020-07-03 ENCOUNTER — Encounter: Payer: Self-pay | Admitting: Student in an Organized Health Care Education/Training Program

## 2020-07-03 ENCOUNTER — Ambulatory Visit (HOSPITAL_BASED_OUTPATIENT_CLINIC_OR_DEPARTMENT_OTHER): Payer: Medicare HMO | Admitting: Student in an Organized Health Care Education/Training Program

## 2020-07-03 ENCOUNTER — Other Ambulatory Visit: Payer: Self-pay

## 2020-07-03 VITALS — BP 136/90 | HR 90 | Temp 97.2°F | Resp 16 | Ht 71.0 in | Wt 254.0 lb

## 2020-07-03 DIAGNOSIS — G894 Chronic pain syndrome: Secondary | ICD-10-CM | POA: Diagnosis not present

## 2020-07-03 DIAGNOSIS — M961 Postlaminectomy syndrome, not elsewhere classified: Secondary | ICD-10-CM | POA: Diagnosis not present

## 2020-07-03 DIAGNOSIS — N761 Subacute and chronic vaginitis: Secondary | ICD-10-CM | POA: Diagnosis not present

## 2020-07-03 DIAGNOSIS — M5416 Radiculopathy, lumbar region: Secondary | ICD-10-CM

## 2020-07-03 DIAGNOSIS — Z9889 Other specified postprocedural states: Secondary | ICD-10-CM

## 2020-07-03 DIAGNOSIS — M5137 Other intervertebral disc degeneration, lumbosacral region: Secondary | ICD-10-CM

## 2020-07-03 DIAGNOSIS — L720 Epidermal cyst: Secondary | ICD-10-CM | POA: Diagnosis not present

## 2020-07-03 NOTE — Progress Notes (Signed)
PROVIDER NOTE: Information contained herein reflects review and annotations entered in association with encounter. Interpretation of such information and data should be left to medically-trained personnel. Information provided to patient can be located elsewhere in the medical record under "Patient Instructions". Document created using STT-dictation technology, any transcriptional errors that may result from process are unintentional.    Patient: Christine Higgins  Service Category: E/M  Provider: [name], MD  DOB: [birth year]  DOS: 07/03/2020  Specialty: Interventional Pain Management  MRN: [id number]  Setting: Ambulatory outpatient  PCP: Kirk Ruths, MD  Type: Established Patient    Referring Provider: Kirk Ruths, MD  Location: Office  Delivery: Face-to-face     HPI  [name], a 64 y.o. year old female, is here today because of her Lumbar radiculopathy [M54.16]. Ms. Innocent primary complain today is Back Pain (left lumbar ) Last encounter: My last encounter with her was on 06/26/2020. Pertinent problems: Ms. Ace has Failed back surgical syndrome; DDD (degenerative disc disease), lumbosacral; Severe obesity (BMI 35.0-35.9 with comorbidity) ([place]); Primary osteoarthritis of right knee; Primary osteoarthritis of left knee; DM II (diabetes mellitus, type II), controlled ([place]); Chronic pain syndrome; Fibromyalgia; and Lumbar radiculopathy on their pertinent problem list. Pain Assessment: Severity of Chronic pain is reported as a 2 /10. Location: Back Lower, Left, Right/into both legs mainly the left. Onset: More than a month ago. Quality: Discomfort, Constant, Aching, Throbbing, Other (Comment), Stabbing, Radiating (debilitating, depressing,). Timing: Constant. Modifying factor(s): stimulator. Vitals:  height is $RemoveB'5\' 11"'ZIejVObJ$  (1.803 m) and weight is 254 lb (115.2 kg). Her temporal temperature is 97.2 F (36.2 C) (abnormal). Her blood pressure is 136/90 and her pulse is 90. Her  respiration is 16 and oxygen saturation is 97%.    Reason for encounter: post-procedure assessment.   Patient presents today for spinal cord stimulator trial lead pull.  She underwent a 7-day [employer] percutaneous spinal cord stimulator trial which provided her with significant pain relief as well as functional benefit.  She endorses approximately 80% pain relief during the course of her spinal cord stimulator trial.  She states that she was able to ambulate with less pain and more comfortably.  She is also utilizing less medication.  She was walking around clinic today which I have never seen her done before.  She usually comes in in a wheelchair.  I am very impressed with how she has responded and she is very excited about having this permanently implanted.  Will place referral to Dr. Lacinda Axon to discuss paddle versus percutaneous spinal cord stimulator implant.   Pharmacotherapy Assessment   Analgesic: Tramadol 50 mg 3 times daily as needed, quantity 90/month; MME equals 15   Monitoring: [place] PMP: PDMP not reviewed this encounter.       Pharmacotherapy: No side-effects or adverse reactions reported. Compliance: No problems identified. Effectiveness: Clinically acceptable.  Janett Billow, RN  07/03/2020  1:02 PM  Sign when Signing Visit Safety precautions to be maintained throughout the outpatient stay will include: orient to surroundings, keep bed in low position, maintain call bell within reach at all times, provide assistance with transfer out of bed and ambulation.     UDS:  Summary  Date Value Ref Range Status  09/29/2019 Note  Final    Comment:    ==================================================================== Compliance Drug Analysis, Ur ==================================================================== Test  Result       Flag       Units Drug Present and Declared for Prescription Verification   7-aminoclonazepam              65            EXPECTED   ng/mg creat    7-aminoclonazepam is an expected metabolite of clonazepam. Source of    clonazepam is a scheduled prescription medication.   Tramadol                       >4310        EXPECTED   ng/mg creat   O-Desmethyltramadol            >4310        EXPECTED   ng/mg creat   N-Desmethyltramadol            3447         EXPECTED   ng/mg creat    Source of tramadol is a prescription medication. O-desmethyltramadol    and N-desmethyltramadol are expected metabolites of tramadol.   Gabapentin                     PRESENT      EXPECTED   Cyclobenzaprine                PRESENT      EXPECTED   Desmethylcyclobenzaprine       PRESENT      EXPECTED    Desmethylcyclobenzaprine is an expected metabolite of    cyclobenzaprine.   Duloxetine                     PRESENT      EXPECTED   Trazodone                      PRESENT      EXPECTED   1,3 chlorophenyl piperazine    PRESENT      EXPECTED    1,3-chlorophenyl piperazine is an expected metabolite of trazodone.   Diphenhydramine                PRESENT      EXPECTED   Propranolol                    PRESENT      EXPECTED Drug Absent but Declared for Prescription Verification   Oxycodone                      Not Detected UNEXPECTED ng/mg creat   Acetaminophen                  Not Detected UNEXPECTED    Acetaminophen, as indicated in the declared medication list, is not    always detected even when used as directed.   Hydroxyzine                    Not Detected UNEXPECTED ==================================================================== Test                      Result    Flag   Units      Ref Range   Creatinine              116              mg/dL      >=20 ==================================================================== Declared Medications:  The flagging and interpretation on this report are based on the  following declared medications.  Unexpected results may arise from  inaccuracies in the declared medications.  **Note: The  testing scope of this panel includes these medications:  Clonazepam  Cyclobenzaprine (Flexeril)  Diphenhydramine (Benadryl)  Duloxetine (Cymbalta)  Gabapentin (Neurontin)  Hydroxyzine  Oxycodone (Roxicodone)  Propranolol (Inderal)  Tramadol (Ultram)  Trazodone (Desyrel)  **Note: The testing scope of this panel does not include small to  moderate amounts of these reported medications:  Acetaminophen  **Note: The testing scope of this panel does not include the  following reported medications:  Apixaban  Atorvastatin (Lipitor)  Betamethasone (Lotrisone)  Budesonide (Symbicort)  Calcium  Ciprofloxacin (Cipro)  Clobetasol  Clotrimazole (Lotrisone)  Esomeprazole (Nexium)  Eye Drops  Formoterol (Symbicort)  Hydralazine (Apresoline)  Hydrochlorothiazide (Hyzaar)  Iron  Losartan (Hyzaar)  Menthol  Multivitamin  Probiotic  Sodium Chloride  Sucralfate (Carafate)  Turmeric  Ubiquinone (CoQ10)  Vitamin B12  Vitamin C  Vitamin D3  Vitamin E ==================================================================== For clinical consultation, please call [phone]. ====================================================================      ROS  Constitutional: Denies any fever or chills Gastrointestinal: No reported hemesis, hematochezia, vomiting, or acute GI distress Musculoskeletal: Denies any acute onset joint swelling, redness, loss of ROM, or weakness Neurological: No reported episodes of acute onset apraxia, aphasia, dysarthria, agnosia, amnesia, paralysis, loss of coordination, or loss of consciousness  Medication Review  Acetaminophen, Apremilast, CoQ-10, DULoxetine, EPINEPHrine, Fluocinolone Acetonide Body, Menthol (Topical Analgesic), Multiple Vitamins-Minerals, Prenatal MV-Min-Fe Fum-FA-DHA, Probiotic Product, Propylene Glycol, Turmeric, Vitamin B-12, Vitamin D3, Vitamin E, atorvastatin, budesonide-formoterol, calcium carbonate, celecoxib, cephALEXin, ciclopirox,  clobetasol cream, clobetasol ointment, clonazePAM, clotrimazole-betamethasone, cyclobenzaprine, diphenhydrAMINE, esomeprazole, fluticasone, gabapentin, hydrALAZINE, hydrOXYzine, hydrocortisone, ketoconazole, linaclotide, losartan-hydrochlorothiazide, methimazole, ofloxacin, propranolol, sucralfate, terbinafine, traMADol, traZODone, vitamin A, and vitamin C  History Review  Allergy: Ms. Yingling is allergic to cephalexin, ibuprofen, shellfish allergy, and aspirin. Drug: Ms. Brockel  reports previous drug use. Drugs: Marijuana and Cocaine. Alcohol:  reports no history of alcohol use. Tobacco:  reports that she quit smoking about 27 years ago. She has never used smokeless tobacco. Social: Ms. Floor  reports that she quit smoking about 27 years ago. She has never used smokeless tobacco. She reports previous drug use. Drugs: Marijuana and Cocaine. She reports that she does not drink alcohol. Medical:  has a past medical history of Anxiety, Asthma, Breast mass, Cervical disc disease, Chronic pain syndrome, Degenerative disc disease, lumbar, Depression, Fibromyalgia, Fibromyalgia, GERD (gastroesophageal reflux disease), Glaucoma, Graves disease, Hemorrhoids, Hyperlipidemia, Hypertension, and Thyroid disease. Surgical: Ms. Niehaus  has a past surgical history that includes carpel tunn (Right); Hand surgery (Right); carpel tunnel (Left); Cesarean section; Shoulder surgery (Right); Back surgery; Neck surgery; Total hip arthroplasty (Right); Excision Morton's neuroma (Left, 02/05/2017); Colonoscopy; Joint replacement; Esophagogastroduodenoscopy (egd) with propofol (N/A, 06/26/2017); Knee Arthroplasty (Left, 08/11/2017); Knee arthroscopy (Right, 07/02/2018); and Reverse shoulder arthroplasty (Left, 06/24/2019). Family: family history includes Cancer in her mother; Heart disease in her father.  Laboratory Chemistry Profile   Renal Lab Results  Component Value Date   BUN 9 04/27/2020   CREATININE 0.85 04/27/2020   BCR 11  (L) 04/27/2020   GFRAA 83 04/27/2020   GFRNONAA 72 04/27/2020     Hepatic Lab Results  Component Value Date   AST 15 04/27/2020   ALT 20 04/27/2020   ALBUMIN 4.7 04/27/2020   ALKPHOS 118 04/27/2020   LIPASE 27 12/14/2016     Electrolytes Lab Results  Component Value Date   NA 137  04/27/2020   K 3.7 04/27/2020   CL 97 04/27/2020   CALCIUM 10.2 04/27/2020   MG 1.9 08/27/2014     Bone No results found for: VD25OH, VD125OH2TOT, FI4332RJ1, OA4166AY3, 25OHVITD1, 25OHVITD2, 25OHVITD3, TESTOFREE, TESTOSTERONE   Inflammation (CRP: Acute Phase) (ESR: Chronic Phase) Lab Results  Component Value Date   CRP <0.8 08/01/2017   ESRSEDRATE 11 08/01/2017       Note: Above Lab results reviewed.   Physical Exam  General appearance: Well nourished, well developed, and well hydrated. In no apparent acute distress Mental status: Alert, oriented x 3 (person, place, & time)       Respiratory: No evidence of acute respiratory distress Eyes: PERLA Vitals: BP 136/90 (BP Location: Right Arm, Patient Position: Sitting, Cuff Size: Normal)   Pulse 90   Temp (!) 97.2 F (36.2 C) (Temporal)   Resp 16   Ht 5\' 11"  (1.803 m)   Wt 254 lb (115.2 kg)   SpO2 97%   BMI 35.43 kg/m  BMI: Estimated body mass index is 35.43 kg/m as calculated from the following:   Height as of this encounter: 5\' 11"  (1.803 m).   Weight as of this encounter: 254 lb (115.2 kg). Ideal: Ideal body weight: 70.8 kg (156 lb 1.4 oz) Adjusted ideal body weight: 88.6 kg (195 lb 4 oz)  Lumbar Spine Area Exam  Skin & Axial Inspection: No masses, redness, or swelling Alignment: Symmetrical Functional ROM: Improved after treatment       Stability: No instability detected Muscle Tone/Strength: Functionally intact. No obvious neuro-muscular anomalies detected. Sensory (Neurological): Improved  Spinal cord stimulator trial leads removed under live fluoroscopy.  Tips intact.  Gait & Posture Assessment  Ambulation: Improved  during duration of SCS trial Gait: Improved with SCS trial Posture: WNL  Lower Extremity Exam    Side: Right lower extremity  Side: Left lower extremity  Stability: No instability observed          Stability: No instability observed          Skin & Extremity Inspection: Skin color, temperature, and hair growth are WNL. No peripheral edema or cyanosis. No masses, redness, swelling, asymmetry, or associated skin lesions. No contractures.  Skin & Extremity Inspection: Skin color, temperature, and hair growth are WNL. No peripheral edema or cyanosis. No masses, redness, swelling, asymmetry, or associated skin lesions. No contractures.  Functional ROM: Improved after treatment                  Functional ROM: Improved after treatment                  Muscle Tone/Strength: Functionally intact. No obvious neuro-muscular anomalies detected.  Muscle Tone/Strength: Functionally intact. No obvious neuro-muscular anomalies detected.  Sensory (Neurological): Unimpaired        Sensory (Neurological): Unimpaired        DTR: Patellar: deferred today Achilles: deferred today Plantar: deferred today  DTR: Patellar: deferred today Achilles: deferred today Plantar: deferred today  Palpation: No palpable anomalies  Palpation: No palpable anomalies    Assessment   Status Diagnosis  Controlled Controlled Controlled 1. Lumbar radiculopathy   2. Failed back surgical syndrome   3. DDD (degenerative disc disease), lumbosacral   4. History of lumbar laminectomy for spinal cord decompression   5. Chronic pain syndrome        Plan of Care   [name] has a current medication list which includes the following long-term medication(s): atorvastatin, budesonide-formoterol, calcium carbonate,  clonazepam, diphenhydramine, duloxetine, duloxetine, fluticasone, gabapentin, hydralazine, linaclotide, losartan-hydrochlorothiazide, methimazole, propranolol, and trazodone.  1.  Status post successful [employer] spinal cord stimulator trial for failed back surgical syndrome.  Referral to Dr. Lacinda Axon to discuss permanent implant, paddle versus percutaneous. 2.  Follow-up early next month for medication management.  Appointment already scheduled.   Orders:  Orders Placed This Encounter  Procedures  . DG PAIN CLINIC C-ARM 1-60 MIN NO REPORT    Intraoperative interpretation by procedural physician at [place].    Standing Status:   Standing    Number of Occurrences:   1    Order Specific Question:   Reason for exam:    Answer:   Assistance in needle guidance and placement for procedures requiring needle placement in or near specific anatomical locations not easily accessible without such assistance.  . Ambulatory referral to Neurosurgery    Referral Priority:   Routine    Referral Type:   Surgical    Referral Reason:   Specialty Services Required    Referred to Provider:   [name], MD    Requested Specialty:   Neurosurgery    Number of Visits Requested:   1   Follow-up plan:   Return for Keep sch. appt.     Status post right L5-S1 ESI 03/23/2019, 6 out of 8 cc injected, #2 on 04/21/19, 5 cc more concentrated, #3 on 06/14/2019: 6 cc, #4 09/29/19 left L5/S1 IL , #5 12/08/19, #6 02/23/20. [employer] SCS trial 06/26/20 significant functional and analgesic benefit.  Patient ambulated during clinic which she has never done before.  Referral to Dr. Lacinda Axon for permanent implant with [facility].Marland Kitchen        Recent Visits Date Type Provider Dept  06/26/20 Procedure visit [name], MD [facility]  05/02/20 Office Visit [name], MD [facility]  Showing recent visits within past 90 days and meeting all other requirements Today's Visits Date Type Provider Dept  07/03/20 Procedure visit [name], MD [facility]  Showing today's visits and meeting all other requirements Future Appointments Date Type Provider Dept  07/27/20 Appointment  [name], MD [facility]  Showing future appointments within next 90 days and meeting all other requirements  I discussed the assessment and treatment plan with the patient. The patient was provided an opportunity to ask questions and all were answered. The patient agreed with the plan and demonstrated an understanding of the instructions.  Patient advised to call back or seek an in-person evaluation if the symptoms or condition worsens.  Duration of encounter: 20 minutes.  Note by: [name], MD Date: 07/03/2020; Time: 1:22 PM

## 2020-07-03 NOTE — Patient Instructions (Signed)

## 2020-07-03 NOTE — Progress Notes (Signed)
Safety precautions to be maintained throughout the outpatient stay will include: orient to surroundings, keep bed in low position, maintain call bell within reach at all times, provide assistance with transfer out of bed and ambulation.  

## 2020-07-04 ENCOUNTER — Other Ambulatory Visit: Payer: Self-pay | Admitting: Gastroenterology

## 2020-07-04 ENCOUNTER — Telehealth: Payer: Self-pay

## 2020-07-04 DIAGNOSIS — K581 Irritable bowel syndrome with constipation: Secondary | ICD-10-CM | POA: Diagnosis not present

## 2020-07-04 DIAGNOSIS — K2100 Gastro-esophageal reflux disease with esophagitis, without bleeding: Secondary | ICD-10-CM | POA: Diagnosis not present

## 2020-07-04 DIAGNOSIS — R1031 Right lower quadrant pain: Secondary | ICD-10-CM

## 2020-07-04 NOTE — Telephone Encounter (Signed)
Called patient Christine Higgins. States he missed SCS last night and cant wait till the Neurosurgeon calls

## 2020-07-05 ENCOUNTER — Other Ambulatory Visit: Payer: Self-pay

## 2020-07-05 ENCOUNTER — Other Ambulatory Visit: Payer: Self-pay | Admitting: Dermatology

## 2020-07-05 ENCOUNTER — Ambulatory Visit (INDEPENDENT_AMBULATORY_CARE_PROVIDER_SITE_OTHER): Payer: Medicare HMO | Admitting: Licensed Clinical Social Worker

## 2020-07-05 DIAGNOSIS — F4325 Adjustment disorder with mixed disturbance of emotions and conduct: Secondary | ICD-10-CM

## 2020-07-05 DIAGNOSIS — B356 Tinea cruris: Secondary | ICD-10-CM

## 2020-07-05 DIAGNOSIS — R21 Rash and other nonspecific skin eruption: Secondary | ICD-10-CM

## 2020-07-05 NOTE — Progress Notes (Signed)
Virtual Visit via Video Note  I connected with [name] on 07/05/20 at 11:00 AM EST by a video enabled telemedicine application and verified that I am speaking with the correct person using two identifiers.  Location: Patient: home Provider: remote office [place], [place])   I discussed the limitations of evaluation and management by telemedicine and the availability of in person appointments. The patient expressed understanding and agreed to proceed.   The patient was advised to call back or seek an in-person evaluation if the symptoms worsen or if the condition fails to improve as anticipated.  I provided 30 minutes of non-face-to-face time during this encounter.   [place], LCSW    THERAPIST PROGRESS NOTE  Session Time: 11:30a-12:00p  Participation Level: Active  Behavioral Response: NAAlertAnxious  Type of Therapy: Individual Therapy  Treatment Goals addressed: Anxiety and Coping  Interventions: CBT and Supportive  Summary: Christine Higgins is a 64 y.o. female who presents with continuing symptoms of anxiety related to adjustment disorder diagnosis. Pt reports that she is compliant with medication and is getting good quality and quantity of sleep.  Allowed pt to explore and express thoughts and feelings associated with upcoming court case: pt is distressed because case continues to be cancelled/rescheuled, and pt does not want pending court case when applying for an apartment.   Discussed pts relationships with family members--pt is so happy that things are going well with all family members.   Encouraged continuing focus on self care and overall life balance. Encouraged positive social support.  Suicidal/Homicidal: No  SI, HI, or AVH reported at time of session.  Therapist Response: Freda Munro continues to make good progress with behavior management, self care, and relational improvements. Pt reports stable mood and fewer anxiety symptoms. Treatment showing  good evolution and development.  Plan: Return again in 3 weeks. The ongoing treatment plan includes maintaining current levels of progress and continuing to build skills to manage mood, improve stress/anxiety management, emotion regulation, distress tolerance, and behavior modification.   Diagnosis: Axis I: Adjustment Disorder with Mixed Emotional Features    Axis II: No diagnosis    Rachel Bo Nanetta Wiegman, LCSW 07/05/2020

## 2020-07-07 DIAGNOSIS — M8949 Other hypertrophic osteoarthropathy, multiple sites: Secondary | ICD-10-CM | POA: Diagnosis not present

## 2020-07-07 DIAGNOSIS — M797 Fibromyalgia: Secondary | ICD-10-CM | POA: Diagnosis not present

## 2020-07-07 DIAGNOSIS — Z79899 Other long term (current) drug therapy: Secondary | ICD-10-CM | POA: Diagnosis not present

## 2020-07-07 DIAGNOSIS — L4050 Arthropathic psoriasis, unspecified: Secondary | ICD-10-CM | POA: Diagnosis not present

## 2020-07-07 DIAGNOSIS — L409 Psoriasis, unspecified: Secondary | ICD-10-CM | POA: Diagnosis not present

## 2020-07-07 DIAGNOSIS — E0500 Thyrotoxicosis with diffuse goiter without thyrotoxic crisis or storm: Secondary | ICD-10-CM | POA: Diagnosis not present

## 2020-07-12 ENCOUNTER — Other Ambulatory Visit: Payer: Self-pay

## 2020-07-12 ENCOUNTER — Encounter: Payer: Self-pay | Admitting: Dermatology

## 2020-07-12 ENCOUNTER — Ambulatory Visit (INDEPENDENT_AMBULATORY_CARE_PROVIDER_SITE_OTHER): Payer: Medicare HMO | Admitting: Dermatology

## 2020-07-12 DIAGNOSIS — R21 Rash and other nonspecific skin eruption: Secondary | ICD-10-CM

## 2020-07-12 DIAGNOSIS — L309 Dermatitis, unspecified: Secondary | ICD-10-CM

## 2020-07-12 DIAGNOSIS — B351 Tinea unguium: Secondary | ICD-10-CM

## 2020-07-12 DIAGNOSIS — L608 Other nail disorders: Secondary | ICD-10-CM

## 2020-07-12 DIAGNOSIS — E0500 Thyrotoxicosis with diffuse goiter without thyrotoxic crisis or storm: Secondary | ICD-10-CM | POA: Diagnosis not present

## 2020-07-12 MED ORDER — TRIAMCINOLONE ACETONIDE 0.1 % EX OINT: 1.0000 "application " | TOPICAL_OINTMENT | Freq: Two times a day (BID) | CUTANEOUS | 1 refills | Status: DC

## 2020-07-12 NOTE — Progress Notes (Signed)
   Follow-Up Visit   Subjective  Christine Higgins is a 64 y.o. female who presents for the following: Follow-up.  Patient here today and advises rash at abdomen is worse. She is using clobetasol 0.05% ointment. She also was pulse dosing terbinafine onychomycosis at the toenails which cleared her tinea corporis and intertrigo.  The following portions of the chart were reviewed this encounter and updated as appropriate:  Tobacco  Allergies  Meds  Problems  Med Hx  Surg Hx  Fam Hx      Review of Systems:  No other skin or systemic complaints except as noted in HPI or Assessment and Plan.  Objective  Well appearing patient in no apparent distress; mood and affect are within normal limits.  A focused examination was performed including face, neck, chest and back and arms, feet and toenails. Relevant physical exam findings are noted in the Assessment and Plan.  Objective  Left Abdomen (side) - Lower: Scaly hyperpigmented papules and plaques at abdomen and back   Objective  Right Forearm - Anterior: Hyperpigmented plaques extensor elbows bilaterally  Objective  multiple fingernails: Longitudinal melanonychia of multiple nails, not widening, not extending to nail fold  Objective  Multiple toenails: Thickened toenails with subungual debris   Assessment & Plan  Dermatitis Left Abdomen (side) - Lower  Chronic, currently flared.  Ddx includes atopic dermatitis vs allergic contact dermatitis vs id reaction  D/c clobetasol Start TMC 0.1% ointment twice daily for 2 weeks. Avoid applying to face, groin, and axilla. Use as directed. Risk of skin atrophy with long-term use reviewed.   Only use clobetasol at elbows when itching.   Topical steroids (such as triamcinolone, fluocinolone, fluocinonide, mometasone, clobetasol, halobetasol, betamethasone, hydrocortisone) can cause thinning and lightening of the skin if they are used for too long in the same area. Your physician has  selected the right strength medicine for your problem and area affected on the body. Please use your medication only as directed by your physician to prevent side effects.   Plan patch testing.    Other Related Medications triamcinolone ointment (KENALOG) 0.1 %  Rash Right Forearm - Anterior  C/w maturational extensor hyperpigmentation Advised chronic, no cure  D/c clobetasol. Only use when itching.   Other Related Medications clobetasol ointment (TEMOVATE) 0.05 %  Melanonychia multiple fingernails  Benign-appearing.  Observation.  Call clinic for new or changing lesions.    Onychomycosis Multiple toenails  Chronic, improving.   Nails starting to grow out. Tolerating terbinafine po well.  Continue pulse dose terbinafine as directed.   Return for patch testing on a Tuesday, Thursday and following Tuesday.  Graciella Belton, RMA, am acting as scribe for Forest Gleason, MD .  Documentation: I have reviewed the above documentation for accuracy and completeness, and I agree with the above.  Forest Gleason, MD   Documentation: I have reviewed the above documentation for accuracy and completeness, and I agree with the above.  Forest Gleason, MD

## 2020-07-12 NOTE — Patient Instructions (Addendum)
Discontinue clobetasol 0.05% ointment.  Start triamcinolone 0.1% ointment twice daily for 2 weeks. Avoid applying to face, groin, and axilla. Use as directed. Risk of skin atrophy with long-term use reviewed.  Only use clobetasol at elbows when itching.   Topical steroids (such as triamcinolone, fluocinolone, fluocinonide, mometasone, clobetasol, halobetasol, betamethasone, hydrocortisone) can cause thinning and lightening of the skin if they are used for too long in the same area. Your physician has selected the right strength medicine for your problem and area affected on the body. Please use your medication only as directed by your physician to prevent side effects.   Gentle Skin Care Guide  1. Bathe no more than once a day.  2. Avoid bathing in hot water  3. Use a mild soap like Dove, Vanicream, Cetaphil, CeraVe. Can use Lever 2000 or Cetaphil antibacterial soap  4. Use soap only where you need it. On most days, use it under your arms, between your legs, and on your feet. Let the water rinse other areas unless visibly dirty.  5. When you get out of the bath/shower, use a towel to gently blot your skin dry, don't rub it.  6. While your skin is still a little damp, apply a moisturizing cream such as Vanicream, CeraVe, Cetaphil, Eucerin, Sarna lotion or plain Vaseline Jelly. For hands apply Neutrogena [place] Hand Cream or Excipial Hand Cream.  7. Reapply moisturizer any time you start to itch or feel dry.  8. Sometimes using free and clear laundry detergents can be helpful. Fabric softener sheets should be avoided. Downy Free & Gentle liquid, or any liquid fabric softener that is free of dyes and perfumes, it acceptable to use  9. If your doctor has given you prescription creams you may apply moisturizers over them

## 2020-07-13 ENCOUNTER — Other Ambulatory Visit: Payer: Self-pay

## 2020-07-13 DIAGNOSIS — L309 Dermatitis, unspecified: Secondary | ICD-10-CM

## 2020-07-13 MED ORDER — TRIAMCINOLONE ACETONIDE 0.1 % EX OINT: 1.0000 "application " | TOPICAL_OINTMENT | Freq: Two times a day (BID) | CUTANEOUS | 1 refills | Status: DC

## 2020-07-14 ENCOUNTER — Ambulatory Visit: Payer: Medicare HMO

## 2020-07-17 ENCOUNTER — Telehealth: Payer: Self-pay

## 2020-07-17 ENCOUNTER — Other Ambulatory Visit: Payer: Self-pay

## 2020-07-17 DIAGNOSIS — L299 Pruritus, unspecified: Secondary | ICD-10-CM

## 2020-07-17 DIAGNOSIS — H5203 Hypermetropia, bilateral: Secondary | ICD-10-CM | POA: Diagnosis not present

## 2020-07-17 DIAGNOSIS — Z961 Presence of intraocular lens: Secondary | ICD-10-CM | POA: Diagnosis not present

## 2020-07-17 DIAGNOSIS — H52222 Regular astigmatism, left eye: Secondary | ICD-10-CM | POA: Diagnosis not present

## 2020-07-17 MED ORDER — HYDROXYZINE HCL 10 MG PO TABS: ORAL_TABLET | ORAL | 0 refills | Status: DC

## 2020-07-17 NOTE — Telephone Encounter (Signed)
Patient called in asking for RF of her Hydroxyzine. Okay to RF?

## 2020-07-17 NOTE — Telephone Encounter (Signed)
Refill of hydroxyzine sent in, JS

## 2020-07-17 NOTE — Telephone Encounter (Signed)
1 refill ok. Caution sedation and risk of falls. Use lowest dose possible for shortest amount of time possible. Long term use has been linked with dementia. Thank you!

## 2020-07-18 ENCOUNTER — Other Ambulatory Visit: Payer: Self-pay | Admitting: Neurosurgery

## 2020-07-18 DIAGNOSIS — G894 Chronic pain syndrome: Secondary | ICD-10-CM | POA: Diagnosis not present

## 2020-07-21 ENCOUNTER — Other Ambulatory Visit: Admission: RE | Admit: 2020-07-21 | Payer: Medicare HMO | Source: Ambulatory Visit

## 2020-07-22 ENCOUNTER — Other Ambulatory Visit: Payer: Self-pay | Admitting: Dermatology

## 2020-07-22 DIAGNOSIS — L219 Seborrheic dermatitis, unspecified: Secondary | ICD-10-CM

## 2020-07-26 ENCOUNTER — Other Ambulatory Visit: Payer: Self-pay

## 2020-07-26 ENCOUNTER — Ambulatory Visit (INDEPENDENT_AMBULATORY_CARE_PROVIDER_SITE_OTHER): Payer: Medicare HMO | Admitting: Licensed Clinical Social Worker

## 2020-07-26 DIAGNOSIS — F4325 Adjustment disorder with mixed disturbance of emotions and conduct: Secondary | ICD-10-CM | POA: Diagnosis not present

## 2020-07-26 NOTE — Progress Notes (Signed)
Virtual Visit via Video Note  I connected with Christine Higgins on 07/26/20 at 11:00 AM EST by a video enabled telemedicine application and verified that I am speaking with the correct person using two identifiers.  Video connection was lost when less than 50% of the duration of the visit was complete, at which time the remainder of the visit was completed via audio only.   Location: Patient: home Provider: remote office [place], [place])   I discussed the limitations of evaluation and management by telemedicine and the availability of in person appointments. The patient expressed understanding and agreed to proceed.   The patient was advised to call back or seek an in-person evaluation if the symptoms worsen or if the condition fails to improve as anticipated.  I provided 45 minutes of non-face-to-face time during this encounter.   Christine Dellis R Monya Kozakiewicz, LCSW    THERAPIST PROGRESS NOTE  Session Time: 11-11:45a  Participation Level: Active  Behavioral Response: NA and NeatAlertgenerally pleasant and positive  Type of Therapy: Individual Therapy  Treatment Goals addressed: Coping  Interventions: CBT, Supportive and Reframing  Summary: [name] is a 64 y.o. female who presents with improving symptoms related to adjustment disorder diagnosis. Pt reports that mood has been stable and that she is managing stress and anxiety better. Pt reports that she is using coping skills to help regulate emotions.   Allowed pt to explore and express thoughts and feelings associated with recent events--pt had a good time for thanksgiving with extended family and friends. Explored relationships with family member and allowed pt to share instances where she has had to set personal boundaries with family members--and the positive results of setting the boundaries.   Reviewed importance of self care, life balance, and positive social support/engagement.   Suicidal/Homicidal: No  Therapist  Response: Christine Higgins reports that she is maintaining behavior modification progress, is communicating better with friends/family members, and is feeling better about self and overall self care--this is reflective of overall progress. Treatment showing good evolution and development.   Plan: Return again in 4 weeks.The ongoing treatment plan includes maintaining current levels of progress and continuing to build skills to manage mood, improve stress/anxiety management, emotion regulation, distress tolerance, and behavior modification.   Diagnosis: Axis I: Adjustment Disorder with Mixed Disturbance of Emotions and Conduct    Axis II: No diagnosis    Christine Bo Jaida Basurto, LCSW 07/26/2020

## 2020-07-27 ENCOUNTER — Other Ambulatory Visit: Payer: Self-pay

## 2020-07-27 ENCOUNTER — Encounter
Admission: RE | Admit: 2020-07-27 | Discharge: 2020-07-27 | Disposition: A | Discharge status: Home / Self Care | Payer: Medicare HMO | Source: Ambulatory Visit | Attending: Neurosurgery | Admitting: Neurosurgery

## 2020-07-27 ENCOUNTER — Ambulatory Visit (HOSPITAL_BASED_OUTPATIENT_CLINIC_OR_DEPARTMENT_OTHER): Payer: Medicare HMO | Admitting: Student in an Organized Health Care Education/Training Program

## 2020-07-27 ENCOUNTER — Encounter: Payer: Self-pay | Admitting: Student in an Organized Health Care Education/Training Program

## 2020-07-27 VITALS — BP 139/84 | HR 72 | Temp 97.5°F | Resp 18 | Ht 71.0 in | Wt 255.0 lb

## 2020-07-27 DIAGNOSIS — M961 Postlaminectomy syndrome, not elsewhere classified: Secondary | ICD-10-CM | POA: Insufficient documentation

## 2020-07-27 DIAGNOSIS — M545 Low back pain, unspecified: Secondary | ICD-10-CM | POA: Insufficient documentation

## 2020-07-27 DIAGNOSIS — I10 Essential (primary) hypertension: Secondary | ICD-10-CM | POA: Diagnosis not present

## 2020-07-27 DIAGNOSIS — M5416 Radiculopathy, lumbar region: Secondary | ICD-10-CM

## 2020-07-27 DIAGNOSIS — Z9889 Other specified postprocedural states: Secondary | ICD-10-CM | POA: Insufficient documentation

## 2020-07-27 DIAGNOSIS — Z01818 Encounter for other preprocedural examination: Secondary | ICD-10-CM | POA: Insufficient documentation

## 2020-07-27 DIAGNOSIS — G894 Chronic pain syndrome: Secondary | ICD-10-CM | POA: Insufficient documentation

## 2020-07-27 DIAGNOSIS — M5137 Other intervertebral disc degeneration, lumbosacral region: Secondary | ICD-10-CM

## 2020-07-27 LAB — CBC
HCT: 37.8 % (ref 36.0–46.0)
Hemoglobin: 12.7 g/dL (ref 12.0–15.0)
MCH: 27.1 pg (ref 26.0–34.0)
MCHC: 33.6 g/dL (ref 30.0–36.0)
MCV: 80.6 fL (ref 80.0–100.0)
Platelets: 175 10*3/uL (ref 150–400)
RBC: 4.69 MIL/uL (ref 3.87–5.11)
RDW: 14.3 % (ref 11.5–15.5)
WBC: 4.1 10*3/uL (ref 4.0–10.5)
nRBC: 0 % (ref 0.0–0.2)

## 2020-07-27 LAB — URINALYSIS, ROUTINE W REFLEX MICROSCOPIC
Bilirubin Urine: NEGATIVE
Glucose, UA: NEGATIVE mg/dL
Hgb urine dipstick: NEGATIVE
Ketones, ur: NEGATIVE mg/dL
Leukocytes,Ua: NEGATIVE
Nitrite: NEGATIVE
Protein, ur: NEGATIVE mg/dL
Specific Gravity, Urine: 1.009 (ref 1.005–1.030)
pH: 6 (ref 5.0–8.0)

## 2020-07-27 LAB — APTT: aPTT: 28 seconds (ref 24–36)

## 2020-07-27 LAB — PROTIME-INR
INR: 1 (ref 0.8–1.2)
Prothrombin Time: 12.4 seconds (ref 11.4–15.2)

## 2020-07-27 LAB — TYPE AND SCREEN
ABO/RH(D): B POS
Antibody Screen: NEGATIVE

## 2020-07-27 MED ORDER — TRAMADOL HCL 50 MG PO TABS: 50.0000 mg | ORAL_TABLET | Freq: Three times a day (TID) | ORAL | 2 refills | Status: DC | PRN

## 2020-07-27 NOTE — Patient Instructions (Signed)
Your procedure is scheduled on: Monday 08/07/20.  Report to THE FIRST FLOOR REGISTRATION DESK IN THE MEDICAL MALL ON THE MORNING OF SURGERY FIRST, THEN YOU WILL CHECK IN AT THE SURGERY INFORMATION DESK LOCATED OUTSIDE THE SAME DAY SURGERY DEPARTMENT LOCATED ON 2ND FLOOR MEDICAL MALL ENTRANCE. To find out your arrival time please call [phone] between 1PM - 3PM on Friday 08/04/20.   Remember: Instructions that are not followed completely may result in serious medical risk, up to and including death, or upon the discretion of your surgeon and anesthesiologist your surgery may need to be rescheduled.     __X__ 1. Do not eat food after midnight the night before your procedure.                 No gum chewing or hard candies. You may drink clear liquids up to 2 hours                 before you are scheduled to arrive for your surgery- DO NOT drink clear                 liquids within 2 hours of the start of your surgery.                 Clear Liquids include:  water, apple juice without pulp, clear carbohydrate                 drink such as Clearfast or Gatorade, Black Coffee or Tea (Do not add                 milk or creamer to coffee or tea).  __X__2.  On the morning of surgery brush your teeth with toothpaste and water, you may rinse your mouth with mouthwash if you wish.  Do not swallow any toothpaste or mouthwash.    __X__ 3.  No Alcohol for 24 hours before or after surgery.  __X__ 4.  Do Not Smoke or use e-cigarettes For 24 Hours Prior to Your Surgery.                 Do not use any chewable tobacco products for at least 6 hours prior to                 surgery.  __X__5.  Notify your doctor if there is any change in your medical condition      (cold, fever, infections).      Do NOT wear jewelry, make-up, hairpins, clips or nail polish. Do NOT wear lotions, powders, or perfumes.  Do NOT shave 48 hours prior to surgery. Men may shave face and neck. Do NOT bring valuables to the  hospital.     [facility] is not responsible for any belongings or valuables.   Contacts, dentures/partials or body piercings may not be worn into surgery. Bring a case for your contacts, glasses or hearing aids, a denture cup will be supplied.  Leave your suitcase in the car. After surgery it may be brought to your room.   For patients admitted to the hospital, discharge time is determined by your treatment team.   __X__ Take these medicines the morning of surgery with A SIP OF WATER:     1. budesonide-formoterol (SYMBICORT)  2. DULoxetine (CYMBALTA)  3. esomeprazole ([place])   4. fexofenadine (ALLEGRA)  5. fluticasone (FLONASE)   6. gabapentin (NEURONTIN)   7. methimazole (TAPAZOLE)  8. propranolol (INDERAL)  9. traMADol (ULTRAM) if needed  10.  Acetaminophen (TYLENOL ARTHRITIS EXT RELIEF PO) if needed    __X__ Use CHG Soap as directed  __X__ Use inhalers on the day of surgery. Also bring the inhaler with you to the hospital on the morning of surgery.  __X__ Stop Anti-inflammatories 7 days before surgery such as Advil, Ibuprofen, Motrin, BC or Goodies Powder, Naprosyn, Naproxen, Aleve, Aspirin, Meloxicam. May take Tylenol if needed for pain or discomfort.   __X__Do not start taking any new herbal supplements or vitamins prior to your procedure.  __X__ Stop all herbal supplements or vitamins on Monday 07/31/20.   Wear comfortable clothing (specific to your surgery type) to the hospital.  Plan for stool softeners for home use; pain medications have a tendency to cause constipation. You can also help prevent constipation by eating foods high in fiber such as fruits and vegetables and drinking plenty of fluids as your diet allows.  After surgery, you can prevent lung complications by doing breathing exercises.Take deep breaths and cough every 1-2 hours. Your doctor may order a device called an Incentive Spirometer to help you take deep breaths.  Please call the [facility] Department at [phone] if you have any questions about these instructions.

## 2020-07-27 NOTE — Progress Notes (Signed)
PROVIDER NOTE: Information contained herein reflects review and annotations entered in association with encounter. Interpretation of such information and data should be left to medically-trained personnel. Information provided to patient can be located elsewhere in the medical record under "Patient Instructions". Document created using STT-dictation technology, any transcriptional errors that may result from process are unintentional.    Patient: Christine Higgins  Service Category: E/M  Provider: [name], MD  DOB: 1956/08/05  DOS: 07/27/2020  Specialty: Interventional Pain Management  MRN: [id number]  Setting: Ambulatory outpatient  PCP: Kirk Ruths, MD  Type: Established Patient    Referring Provider: Kirk Ruths, MD  Location: Office  Delivery: Face-to-face     HPI  [name], a 64 y.o. year old female, is here today because of her Chronic pain syndrome [G89.4]. Christine Higgins primary complain today is Back Pain (lower) Last encounter: My last encounter with her was on 07/03/2020. Pertinent problems: Christine Higgins has Failed back surgical syndrome; DDD (degenerative disc disease), lumbosacral; Severe obesity (BMI 35.0-35.9 with comorbidity) ([place]); Primary osteoarthritis of right knee; Primary osteoarthritis of left knee; DM II (diabetes mellitus, type II), controlled ([place]); Chronic pain syndrome; Fibromyalgia; and Lumbar radiculopathy on their pertinent problem list. Pain Assessment: Severity of Chronic pain is reported as a 8 /10. Location: Back Lower/left leg to the toes. Onset: More than a month ago. Quality: Throbbing, Nagging. Timing: Constant. Modifying factor(s): medications. Vitals:  height is _0  (1.803 m) and weight is 255 lb (115.7 kg). Her temporal temperature is 97.5 F (36.4 C) (abnormal). Her blood pressure is 139/84 and her pulse is 72. Her respiration is 18 and oxygen saturation is 98%.   Reason for encounter: medication management.   No change in medical  history since her last visit.  Patient is status post successful [employer] spinal cord stimulator trial.  She is scheduled to have an upcoming implant with Dr Lacinda Axon.  She is looking forward to this.  Otherwise we will refill her tramadol as below.   07/18/2020  05/02/2020   1  Tramadol Hcl 50 Mg Tablet 90.00  30  Bi Lat  [id number]  [place] (2541)  2/2  15.00 MME  Medicare  [place]      Pharmacotherapy Assessment   Analgesic: Tramadol 50 mg 3 times daily as needed, quantity 90/month; MME equals 15   Monitoring: [place] PMP: PDMP reviewed during this encounter.       Pharmacotherapy: No side-effects or adverse reactions reported. Compliance: No problems identified. Effectiveness: Clinically acceptable.  Christine Martins, RN  07/27/2020 11:23 AM  Sign when Signing Visit Nursing Pain Medication Assessment:  Safety precautions to be maintained throughout the outpatient stay will include: orient to surroundings, keep bed in low position, maintain call bell within reach at all times, provide assistance with transfer out of bed and ambulation.  Medication Inspection Compliance: Christine Higgins did not comply with our request to bring her pills to be counted. She was reminded that bringing the medication bottles, even when empty, is a requirement.  Medication: None brought in. Pill/Patch Count: None available to be counted. Bottle Appearance: No container available. Did not bring bottle(s) to appointment. Filled Date: N/A Last Medication intake:  Today    UDS:  Summary  Date Value Ref Range Status  09/29/2019 Note  Final    Comment:    ==================================================================== Compliance Drug Analysis, Ur ==================================================================== Test  Result       Flag       Units Drug Present and Declared for Prescription Verification   7-aminoclonazepam              65           EXPECTED   ng/mg creat     7-aminoclonazepam is an expected metabolite of clonazepam. Source of    clonazepam is a scheduled prescription medication.   Tramadol                       >4310        EXPECTED   ng/mg creat   O-Desmethyltramadol            >4310        EXPECTED   ng/mg creat   N-Desmethyltramadol            3447         EXPECTED   ng/mg creat    Source of tramadol is a prescription medication. O-desmethyltramadol    and N-desmethyltramadol are expected metabolites of tramadol.   Gabapentin                     PRESENT      EXPECTED   Cyclobenzaprine                PRESENT      EXPECTED   Desmethylcyclobenzaprine       PRESENT      EXPECTED    Desmethylcyclobenzaprine is an expected metabolite of    cyclobenzaprine.   Duloxetine                     PRESENT      EXPECTED   Trazodone                      PRESENT      EXPECTED   1,3 chlorophenyl piperazine    PRESENT      EXPECTED    1,3-chlorophenyl piperazine is an expected metabolite of trazodone.   Diphenhydramine                PRESENT      EXPECTED   Propranolol                    PRESENT      EXPECTED Drug Absent but Declared for Prescription Verification   Oxycodone                      Not Detected UNEXPECTED ng/mg creat   Acetaminophen                  Not Detected UNEXPECTED    Acetaminophen, as indicated in the declared medication list, is not    always detected even when used as directed.   Hydroxyzine                    Not Detected UNEXPECTED ==================================================================== Test                      Result    Flag   Units      Ref Range   Creatinine              116              mg/dL      >=20 ==================================================================== Declared Medications:  The flagging and interpretation on this report are based on the  following declared medications.  Unexpected results may arise from  inaccuracies in the declared medications.  **Note: The testing scope of this panel  includes these medications:  Clonazepam  Cyclobenzaprine (Flexeril)  Diphenhydramine (Benadryl)  Duloxetine (Cymbalta)  Gabapentin (Neurontin)  Hydroxyzine  Oxycodone (Roxicodone)  Propranolol (Inderal)  Tramadol (Ultram)  Trazodone (Desyrel)  **Note: The testing scope of this panel does not include small to  moderate amounts of these reported medications:  Acetaminophen  **Note: The testing scope of this panel does not include the  following reported medications:  Apixaban  Atorvastatin (Lipitor)  Betamethasone (Lotrisone)  Budesonide (Symbicort)  Calcium  Ciprofloxacin (Cipro)  Clobetasol  Clotrimazole (Lotrisone)  Esomeprazole (Nexium)  Eye Drops  Formoterol (Symbicort)  Hydralazine (Apresoline)  Hydrochlorothiazide (Hyzaar)  Iron  Losartan (Hyzaar)  Menthol  Multivitamin  Probiotic  Sodium Chloride  Sucralfate (Carafate)  Turmeric  Ubiquinone (CoQ10)  Vitamin B12  Vitamin C  Vitamin D3  Vitamin E ==================================================================== For clinical consultation, please call [phone]. ====================================================================      ROS  Constitutional: Denies any fever or chills Gastrointestinal: No reported hemesis, hematochezia, vomiting, or acute GI distress Musculoskeletal: Denies any acute onset joint swelling, redness, loss of ROM, or weakness Neurological: No reported episodes of acute onset apraxia, aphasia, dysarthria, agnosia, amnesia, paralysis, loss of coordination, or loss of consciousness  Medication Review  Acetaminophen, Apremilast, CoQ-10, DULoxetine, Fish Oil, Fluocinolone Acetonide Body, Menthol (Topical Analgesic), Multiple Vitamin, Multiple Vitamins-Minerals, Prenatal MV-Min-Fe Fum-FA-DHA, Probiotic Product, Propylene Glycol, Turmeric, Vitamin B-12, Vitamin D3, Vitamin E, atorvastatin, budesonide-formoterol, calcium carbonate, celecoxib, clonazePAM, clotrimazole-betamethasone,  cyclobenzaprine, esomeprazole, fexofenadine, fluticasone, gabapentin, hydrALAZINE, hydrOXYzine, hydrocortisone, ketoconazole, linaclotide, losartan-hydrochlorothiazide, methimazole, ofloxacin, propranolol, sucralfate, terbinafine, traMADol, traZODone, triamcinolone ointment, vitamin A, and vitamin C  History Review  Allergy: Ms. Sipe is allergic to cephalexin, ibuprofen, shellfish allergy, and aspirin. Drug: Ms. Pensinger  reports previous drug use. Alcohol:  reports no history of alcohol use. Tobacco:  reports that she quit smoking about 27 years ago. She has never used smokeless tobacco. Social: Ms. [place]  reports that she quit smoking about 27 years ago. She has never used smokeless tobacco. She reports previous drug use. She reports that she does not drink alcohol. Medical:  has a past medical history of Anxiety, Asthma, Breast mass, Cervical disc disease, Chronic pain syndrome, Degenerative disc disease, lumbar, Depression, Fibromyalgia, Fibromyalgia, GERD (gastroesophageal reflux disease), Glaucoma, Graves disease, Hemorrhoids, Hyperlipidemia, Hypertension, and Thyroid disease. Surgical: Ms. Pedro  has a past surgical history that includes carpel tunn (Right); Hand surgery (Right); carpel tunnel (Left); Cesarean section; Shoulder surgery (Right); Back surgery; Neck surgery; Total hip arthroplasty (Right); Excision Morton's neuroma (Left, 02/05/2017); Colonoscopy; Joint replacement; Esophagogastroduodenoscopy (egd) with propofol (N/A, 06/26/2017); Knee Arthroplasty (Left, 08/11/2017); Knee arthroscopy (Right, 07/02/2018); and Reverse shoulder arthroplasty (Left, 06/24/2019). Family: family history includes Cancer in her mother; Heart disease in her father.  Laboratory Chemistry Profile   Renal Lab Results  Component Value Date   BUN 9 04/27/2020   CREATININE 0.85 04/27/2020   BCR 11 (L) 04/27/2020   GFRAA 83 04/27/2020   GFRNONAA 72 04/27/2020     Hepatic Lab Results  Component Value Date    AST 15 04/27/2020   ALT 20 04/27/2020   ALBUMIN 4.7 04/27/2020   ALKPHOS 118 04/27/2020   LIPASE 27 12/14/2016     Electrolytes Lab Results  Component Value Date   NA 137 04/27/2020   K 3.7 04/27/2020   CL  97 04/27/2020   CALCIUM 10.2 04/27/2020   MG 1.9 08/27/2014     Bone No results found for: VD25OH, [id number], JW1191YN8, GN5621HY8, 25OHVITD1, 25OHVITD2, 25OHVITD3, TESTOFREE, TESTOSTERONE   Inflammation (CRP: Acute Phase) (ESR: Chronic Phase) Lab Results  Component Value Date   CRP <0.8 08/01/2017   ESRSEDRATE 11 08/01/2017       Note: Above Lab results reviewed.  Recent Imaging Review  DG PAIN CLINIC C-ARM 1-60 MIN NO REPORT Fluoro was used, but no Radiologist interpretation will be provided.  Please refer to "NOTES" tab for provider progress note. Note: Reviewed        Physical Exam  General appearance: Well nourished, well developed, and well hydrated. In no apparent acute distress Mental status: Alert, oriented x 3 (person, place, & time)       Respiratory: No evidence of acute respiratory distress Eyes: PERLA Vitals: BP 139/84   Pulse 72   Temp (!) 97.5 F (36.4 C) (Temporal)   Resp 18   Ht _0  (1.803 m)   Wt 255 lb (115.7 kg)   SpO2 98%   BMI 35.57 kg/m  BMI: Estimated body mass index is 35.57 kg/m as calculated from the following:   Height as of this encounter: _1  (1.803 m).   Weight as of this encounter: 255 lb (115.7 kg). Ideal: Ideal body weight: 70.8 kg (156 lb 1.4 oz) Adjusted ideal body weight: 88.7 kg (195 lb 10.4 oz)  Lumbar Spine Area Exam  Skin & Axial Inspection:Well healed scar from previous spine surgery detected Alignment:Symmetrical Functional MVH:QION restricted ROM Stability:No instability detected Muscle Tone/Strength:Functionally intact. No obvious neuro-muscular anomalies detected. Sensory (Neurological):Dermatomal pain pattern  Positive straight leg raise test bilaterally  Lower Extremity Exam     Side:Right lower extremity  Side:Left lower extremity  Stability:No instability observed  Stability:No instability observed  Skin & Extremity Inspection:Skin color, temperature, and hair growth are WNL. No peripheral edema or cyanosis. No masses, redness, swelling, asymmetry, or associated skin lesions. No contractures.  Skin & Extremity Inspection:Skin color, temperature, and hair growth are WNL. No peripheral edema or cyanosis. No masses, redness, swelling, asymmetry, or associated skin lesions. No contractures.  Functional GEX:[id number] ROM   Functional UVO:ZDGU restricted ROMfor hip and knee joints   Muscle Tone/Strength:Functionally intact. No obvious neuro-muscular anomalies detected.  Muscle Tone/Strength:Functionally intact. No obvious neuro-muscular anomalies detected.  Sensory (Neurological):Unimpaired  Sensory (Neurological):Dermatomal pain pattern  DTR: Patellar:deferred today Achilles:deferred today Plantar:deferred today  DTR: Patellar:deferred today Achilles:deferred today Plantar:deferred today  Palpation:No palpable anomalies  Palpation:No palpable anomalies     Assessment   Status Diagnosis  Controlled Controlled Controlled 1. Chronic pain syndrome   2. Lumbar radiculopathy   3. History of lumbar laminectomy for spinal cord decompression   4. Failed back surgical syndrome   5. DDD (degenerative disc disease), lumbosacral   6. Back pain at L4-L5 level       Plan of Care  [name] has a current medication list which includes the following long-term medication(s): atorvastatin, budesonide-formoterol, calcium carbonate, clonazepam, duloxetine, duloxetine, fexofenadine, fluticasone, gabapentin, hydralazine, linaclotide, losartan-hydrochlorothiazide, methimazole, propranolol, and trazodone.  Pharmacotherapy (Medications Ordered): Meds ordered this encounter  Medications  .  traMADol (ULTRAM) 50 MG tablet    Sig: Take 1 tablet (50 mg total) by mouth every 8 (eight) hours as needed for severe pain.    Dispense:  90 tablet    Refill:  2    Fill one day early if pharmacy is closed on scheduled  refill date.   -Proceed with SCS implant, can hopefully wean Tramadol in spring.  Follow-up plan:   Return in about 3 months (around 10/25/2020) for Medication Management, in person.     Status post right L5-S1 ESI 03/23/2019, 6 out of 8 cc injected, #2 on 04/21/19, 5 cc more concentrated, #3 on 06/14/2019: 6 cc, #4 09/29/19 left L5/S1 IL , #5 12/08/19, #6 02/23/20. [employer] SCS trial 06/26/20 significant functional and analgesic benefit.  Patient ambulated during clinic which she has never done before.  Plan for implant with Dr Lacinda Axon        Recent Visits Date Type Provider Dept  07/03/20 Procedure visit [name], MD [facility]  06/26/20 Procedure visit [name], MD [facility]  05/02/20 Office Visit [name], MD [facility]  Showing recent visits within past 90 days and meeting all other requirements Today's Visits Date Type Provider Dept  07/27/20 Office Visit [name], MD [facility]  Showing today's visits and meeting all other requirements Future Appointments Date Type Provider Dept  10/24/20 Appointment [name], MD [facility]  Showing future appointments within next 90 days and meeting all other requirements  I discussed the assessment and treatment plan with the patient. The patient was provided an opportunity to ask questions and all were answered. The patient agreed with the plan and demonstrated an understanding of the instructions.  Patient advised to call back or seek an in-person evaluation if the symptoms or condition worsens.  Duration of encounter: 30 minutes.  Note by: [name], MD Date: 07/27/2020; Time: 12:25 PM

## 2020-07-27 NOTE — Patient Instructions (Addendum)
Tramadol to last until 11/14/2020 has been escribed to your pharmacy.

## 2020-07-27 NOTE — Progress Notes (Signed)
Nursing Pain Medication Assessment:  Safety precautions to be maintained throughout the outpatient stay will include: orient to surroundings, keep bed in low position, maintain call bell within reach at all times, provide assistance with transfer out of bed and ambulation.  Medication Inspection Compliance: Ms. Arther did not comply with our request to bring her pills to be counted. She was reminded that bringing the medication bottles, even when empty, is a requirement.  Medication: None brought in. Pill/Patch Count: None available to be counted. Bottle Appearance: No container available. Did not bring bottle(s) to appointment. Filled Date: N/A Last Medication intake:  Today  

## 2020-07-30 ENCOUNTER — Other Ambulatory Visit: Payer: Self-pay | Admitting: Dermatology

## 2020-07-30 DIAGNOSIS — B356 Tinea cruris: Secondary | ICD-10-CM

## 2020-07-31 ENCOUNTER — Ambulatory Visit
Admission: RE | Admit: 2020-07-31 | Discharge: 2020-07-31 | Disposition: A | Discharge status: Home / Self Care | Payer: Medicare HMO | Source: Ambulatory Visit | Attending: Gastroenterology | Admitting: Gastroenterology

## 2020-07-31 ENCOUNTER — Other Ambulatory Visit: Payer: Self-pay

## 2020-07-31 DIAGNOSIS — R1031 Right lower quadrant pain: Secondary | ICD-10-CM | POA: Diagnosis not present

## 2020-07-31 MED ORDER — IOHEXOL 300 MG/ML  SOLN
100.0000 mL | Freq: Once | INTRAMUSCULAR | Status: AC | PRN
  Administered 2020-07-31: 100 mL via INTRAVENOUS

## 2020-08-01 ENCOUNTER — Ambulatory Visit: Payer: Medicare HMO

## 2020-08-02 ENCOUNTER — Other Ambulatory Visit: Payer: Self-pay

## 2020-08-02 ENCOUNTER — Ambulatory Visit (INDEPENDENT_AMBULATORY_CARE_PROVIDER_SITE_OTHER): Payer: Medicare HMO | Admitting: Dermatology

## 2020-08-02 DIAGNOSIS — L309 Dermatitis, unspecified: Secondary | ICD-10-CM

## 2020-08-02 NOTE — Patient Instructions (Signed)
Discontinue Triamcinolone at the right chest  Start clobetasol 0.05% ointment to more severe areas twice daily up to 3 weeks. Avoid applying to face, groin, and axilla. Use as directed. Risk of skin atrophy with long-term use reviewed.   Topical steroids (such as triamcinolone, fluocinolone, fluocinonide, mometasone, clobetasol, halobetasol, betamethasone, hydrocortisone) can cause thinning and lightening of the skin if they are used for too long in the same area. Your physician has selected the right strength medicine for your problem and area affected on the body. Please use your medication only as directed by your physician to prevent side effects.

## 2020-08-02 NOTE — Progress Notes (Signed)
   Follow-Up Visit   Subjective  Christine Higgins is a 64 y.o. female who presents for the following: Rash (spot at right chest and spots at abdomen, not responding to triamcinolone, itchy ).  Patient here today for patch testing but having surgery at back next Monday so will reschedule for January.   The following portions of the chart were reviewed this encounter and updated as appropriate:   Tobacco  Allergies  Meds  Problems  Med Hx  Surg Hx  Fam Hx      Review of Systems:  No other skin or systemic complaints except as noted in HPI or Assessment and Plan.  Objective  Well appearing patient in no apparent distress; mood and affect are within normal limits.  A focused examination was performed including chest, abdomen. Relevant physical exam findings are noted in the Assessment and Plan.  Objective  Right Breast: Hyperpigmented scaly plaques at right chest, abdomen   Assessment & Plan  Dermatitis Right Breast  Chronic condition, no cure, only control. Not currently at goal.    D/c TMC at the right chest  Start clobetasol 0.05% ointment to more severe areas twice daily up to 3 weeks. Avoid applying to face, groin, and axilla. Use as directed. Risk of skin atrophy with long-term use reviewed.   Topical steroids (such as triamcinolone, fluocinolone, fluocinonide, mometasone, clobetasol, halobetasol, betamethasone, hydrocortisone) can cause thinning and lightening of the skin if they are used for too long in the same area. Your physician has selected the right strength medicine for your problem and area affected on the body. Please use your medication only as directed by your physician to prevent side effects.   Patch testing deferred today as she is having spinal cord stimulator placed next week.  We will plan patch testing in January when she is recovered.   Other Related Medications triamcinolone ointment (KENALOG) 0.1 %  Return in about 6 weeks (around 09/13/2020) for  rash.  IMargarette Asal, CMA, scribed for Alfonso Patten, MD.   Documentation: I have reviewed the above documentation for accuracy and completeness, and I agree with the above.  Forest Gleason, MD

## 2020-08-03 ENCOUNTER — Encounter: Payer: Self-pay | Admitting: Dermatology

## 2020-08-03 ENCOUNTER — Other Ambulatory Visit
Admission: RE | Admit: 2020-08-03 | Discharge: 2020-08-03 | Disposition: A | Discharge status: Home / Self Care | Payer: Medicare HMO | Source: Ambulatory Visit | Attending: Neurosurgery | Admitting: Neurosurgery

## 2020-08-03 DIAGNOSIS — Z01812 Encounter for preprocedural laboratory examination: Secondary | ICD-10-CM | POA: Diagnosis not present

## 2020-08-03 DIAGNOSIS — Z20822 Contact with and (suspected) exposure to covid-19: Secondary | ICD-10-CM | POA: Insufficient documentation

## 2020-08-03 LAB — SURGICAL PCR SCREEN
MRSA, PCR: NEGATIVE
Staphylococcus aureus: NEGATIVE

## 2020-08-03 LAB — SARS CORONAVIRUS 2 (TAT 6-24 HRS): SARS Coronavirus 2: NEGATIVE

## 2020-08-07 ENCOUNTER — Ambulatory Visit: Payer: Medicare HMO

## 2020-08-07 ENCOUNTER — Other Ambulatory Visit: Payer: Self-pay

## 2020-08-07 ENCOUNTER — Encounter: Payer: Self-pay | Admitting: Neurosurgery

## 2020-08-07 ENCOUNTER — Ambulatory Visit: Payer: Medicare HMO | Admitting: Registered Nurse

## 2020-08-07 ENCOUNTER — Encounter
Admission: RE | Disposition: A | Discharge status: Home / Self Care | Payer: Self-pay | Source: Home / Self Care | Attending: Neurosurgery

## 2020-08-07 ENCOUNTER — Observation Stay
Admission: RE | Admit: 2020-08-07 | Discharge: 2020-08-08 | Disposition: A | Discharge status: Home / Self Care | Payer: Medicare HMO | Attending: Neurosurgery | Admitting: Neurosurgery

## 2020-08-07 DIAGNOSIS — I10 Essential (primary) hypertension: Secondary | ICD-10-CM | POA: Diagnosis not present

## 2020-08-07 DIAGNOSIS — G894 Chronic pain syndrome: Secondary | ICD-10-CM | POA: Diagnosis not present

## 2020-08-07 DIAGNOSIS — J45909 Unspecified asthma, uncomplicated: Secondary | ICD-10-CM | POA: Insufficient documentation

## 2020-08-07 DIAGNOSIS — Z96612 Presence of left artificial shoulder joint: Secondary | ICD-10-CM | POA: Insufficient documentation

## 2020-08-07 DIAGNOSIS — M545 Low back pain, unspecified: Secondary | ICD-10-CM | POA: Diagnosis not present

## 2020-08-07 DIAGNOSIS — Z79899 Other long term (current) drug therapy: Secondary | ICD-10-CM | POA: Diagnosis not present

## 2020-08-07 DIAGNOSIS — Z419 Encounter for procedure for purposes other than remedying health state, unspecified: Secondary | ICD-10-CM

## 2020-08-07 DIAGNOSIS — E118 Type 2 diabetes mellitus with unspecified complications: Secondary | ICD-10-CM

## 2020-08-07 DIAGNOSIS — Z9682 Presence of neurostimulator: Secondary | ICD-10-CM | POA: Diagnosis not present

## 2020-08-07 DIAGNOSIS — Z96641 Presence of right artificial hip joint: Secondary | ICD-10-CM | POA: Diagnosis not present

## 2020-08-07 DIAGNOSIS — K219 Gastro-esophageal reflux disease without esophagitis: Secondary | ICD-10-CM | POA: Diagnosis not present

## 2020-08-07 DIAGNOSIS — M5136 Other intervertebral disc degeneration, lumbar region: Secondary | ICD-10-CM | POA: Diagnosis not present

## 2020-08-07 DIAGNOSIS — G8929 Other chronic pain: Secondary | ICD-10-CM | POA: Diagnosis present

## 2020-08-07 DIAGNOSIS — E119 Type 2 diabetes mellitus without complications: Secondary | ICD-10-CM | POA: Diagnosis not present

## 2020-08-07 DIAGNOSIS — E785 Hyperlipidemia, unspecified: Secondary | ICD-10-CM | POA: Diagnosis not present

## 2020-08-07 HISTORY — PX: THORACIC LAMINECTOMY FOR SPINAL CORD STIMULATOR: SHX6887

## 2020-08-07 LAB — GLUCOSE, CAPILLARY
Glucose-Capillary: 134 mg/dL — ABNORMAL HIGH (ref 70–99)
Glucose-Capillary: 134 mg/dL — ABNORMAL HIGH (ref 70–99)

## 2020-08-07 SURGERY — THORACIC LAMINECTOMY FOR SPINAL CORD STIMULATOR
Anesthesia: General

## 2020-08-07 MED ORDER — PROPOFOL 500 MG/50ML IV EMUL
INTRAVENOUS | Status: AC
  Filled 2020-08-07: qty 50

## 2020-08-07 MED ORDER — METHOCARBAMOL 1000 MG/10ML IJ SOLN
500.0000 mg | Freq: Four times a day (QID) | INTRAVENOUS | Status: DC
  Administered 2020-08-07: 500 mg via INTRAVENOUS
  Filled 2020-08-07 (×2): qty 5

## 2020-08-07 MED ORDER — POLYETHYLENE GLYCOL 3350 17 G PO PACK
17.0000 g | PACK | Freq: Every day | ORAL | Status: DC | PRN
  Filled 2020-08-07: qty 1

## 2020-08-07 MED ORDER — SODIUM CHLORIDE 0.9 % IV SOLN
50.0000 mL/h | INTRAVENOUS | Status: DC
  Administered 2020-08-07: 50 mL/h via INTRAVENOUS

## 2020-08-07 MED ORDER — CLONAZEPAM 0.5 MG PO TABS
ORAL_TABLET | ORAL | Status: AC
  Administered 2020-08-07: 0.5 mg via ORAL
  Filled 2020-08-07: qty 1

## 2020-08-07 MED ORDER — SUCCINYLCHOLINE CHLORIDE 20 MG/ML IJ SOLN
INTRAMUSCULAR | Status: DC | PRN
  Administered 2020-08-07: 120 mg via INTRAVENOUS

## 2020-08-07 MED ORDER — MIDAZOLAM HCL 2 MG/2ML IJ SOLN
INTRAMUSCULAR | Status: AC
  Filled 2020-08-07: qty 2

## 2020-08-07 MED ORDER — GABAPENTIN 300 MG PO CAPS
ORAL_CAPSULE | ORAL | Status: AC
  Administered 2020-08-07: 300 mg via ORAL
  Filled 2020-08-07: qty 1

## 2020-08-07 MED ORDER — FLEET ENEMA 7-19 GM/118ML RE ENEM: 1.0000 | ENEMA | Freq: Once | RECTAL | Status: DC | PRN

## 2020-08-07 MED ORDER — ONDANSETRON HCL 4 MG/2ML IJ SOLN: 4.0000 mg | Freq: Four times a day (QID) | INTRAMUSCULAR | Status: DC | PRN

## 2020-08-07 MED ORDER — PHENYLEPHRINE HCL (PRESSORS) 10 MG/ML IV SOLN
INTRAVENOUS | Status: AC
  Filled 2020-08-07: qty 1

## 2020-08-07 MED ORDER — SODIUM CHLORIDE 0.9% FLUSH
3.0000 mL | INTRAVENOUS | Status: DC | PRN
  Administered 2020-08-08: 3 mL via INTRAVENOUS

## 2020-08-07 MED ORDER — FENTANYL CITRATE (PF) 100 MCG/2ML IJ SOLN
INTRAMUSCULAR | Status: AC
  Filled 2020-08-07: qty 2

## 2020-08-07 MED ORDER — SODIUM CHLORIDE 0.9% FLUSH
3.0000 mL | Freq: Two times a day (BID) | INTRAVENOUS | Status: DC
  Administered 2020-08-07 – 2020-08-08 (×2): 3 mL via INTRAVENOUS

## 2020-08-07 MED ORDER — LACTATED RINGERS IV SOLN: INTRAVENOUS | Status: DC

## 2020-08-07 MED ORDER — MOMETASONE FURO-FORMOTEROL FUM 200-5 MCG/ACT IN AERO
2.0000 | INHALATION_SPRAY | Freq: Two times a day (BID) | RESPIRATORY_TRACT | Status: DC
  Administered 2020-08-07 – 2020-08-08 (×2): 2 via RESPIRATORY_TRACT
  Filled 2020-08-07: qty 8.8

## 2020-08-07 MED ORDER — SODIUM CHLORIDE (PF) 0.9 % IJ SOLN
INTRAMUSCULAR | Status: AC
  Filled 2020-08-07: qty 20

## 2020-08-07 MED ORDER — ACETAMINOPHEN 500 MG PO TABS
ORAL_TABLET | ORAL | Status: AC
  Administered 2020-08-07: 1000 mg via ORAL
  Filled 2020-08-07: qty 2

## 2020-08-07 MED ORDER — DEXAMETHASONE SODIUM PHOSPHATE 10 MG/ML IJ SOLN
INTRAMUSCULAR | Status: AC
  Filled 2020-08-07: qty 1

## 2020-08-07 MED ORDER — ALUM & MAG HYDROXIDE-SIMETH 200-200-20 MG/5ML PO SUSP: 30.0000 mL | Freq: Four times a day (QID) | ORAL | Status: DC | PRN

## 2020-08-07 MED ORDER — VANCOMYCIN HCL 1000 MG IV SOLR
INTRAVENOUS | Status: DC | PRN
  Administered 2020-08-07: 1000 mg via TOPICAL

## 2020-08-07 MED ORDER — LIDOCAINE HCL (CARDIAC) PF 100 MG/5ML IV SOSY
PREFILLED_SYRINGE | INTRAVENOUS | Status: DC | PRN
  Administered 2020-08-07: 100 mg via INTRAVENOUS

## 2020-08-07 MED ORDER — LACTATED RINGERS IV SOLN: 1000.0000 mL | Freq: Once | INTRAVENOUS | Status: DC

## 2020-08-07 MED ORDER — PHENOL 1.4 % MT LIQD
1.0000 | OROMUCOSAL | Status: DC | PRN
  Filled 2020-08-07: qty 177

## 2020-08-07 MED ORDER — CEFAZOLIN SODIUM-DEXTROSE 2-4 GM/100ML-% IV SOLN
INTRAVENOUS | Status: AC
  Filled 2020-08-07: qty 100

## 2020-08-07 MED ORDER — REMIFENTANIL HCL 1 MG IV SOLR
INTRAVENOUS | Status: AC
  Filled 2020-08-07: qty 1000

## 2020-08-07 MED ORDER — DEXAMETHASONE SODIUM PHOSPHATE 10 MG/ML IJ SOLN
INTRAMUSCULAR | Status: DC | PRN
  Administered 2020-08-07: 10 mg via INTRAVENOUS

## 2020-08-07 MED ORDER — THROMBIN 5000 UNITS EX SOLR
CUTANEOUS | Status: DC | PRN
  Administered 2020-08-07: 5000 [IU] via TOPICAL

## 2020-08-07 MED ORDER — GABAPENTIN 300 MG PO CAPS
300.0000 mg | ORAL_CAPSULE | Freq: Three times a day (TID) | ORAL | Status: DC
  Administered 2020-08-08: 300 mg via ORAL

## 2020-08-07 MED ORDER — CLONAZEPAM 0.5 MG PO TABS: 0.5000 mg | ORAL_TABLET | Freq: Every evening | ORAL | Status: DC | PRN

## 2020-08-07 MED ORDER — ACETAMINOPHEN 500 MG PO TABS
ORAL_TABLET | ORAL | Status: AC
  Filled 2020-08-07: qty 2

## 2020-08-07 MED ORDER — ONDANSETRON HCL 4 MG/2ML IJ SOLN: 4.0000 mg | Freq: Once | INTRAMUSCULAR | Status: DC | PRN

## 2020-08-07 MED ORDER — LORATADINE 10 MG PO TABS
10.0000 mg | ORAL_TABLET | Freq: Every day | ORAL | Status: DC
  Administered 2020-08-08: 10 mg via ORAL
  Filled 2020-08-07: qty 1

## 2020-08-07 MED ORDER — PROPOFOL 10 MG/ML IV BOLUS
INTRAVENOUS | Status: AC
  Filled 2020-08-07: qty 20

## 2020-08-07 MED ORDER — CEFAZOLIN SODIUM-DEXTROSE 2-4 GM/100ML-% IV SOLN
2.0000 g | Freq: Once | INTRAVENOUS | Status: AC
  Administered 2020-08-07: 2 g via INTRAVENOUS

## 2020-08-07 MED ORDER — MENTHOL 3 MG MT LOZG
1.0000 | LOZENGE | OROMUCOSAL | Status: DC | PRN
  Filled 2020-08-07: qty 9

## 2020-08-07 MED ORDER — PROPRANOLOL HCL 40 MG PO TABS
40.0000 mg | ORAL_TABLET | Freq: Two times a day (BID) | ORAL | Status: DC
  Administered 2020-08-07 – 2020-08-08 (×2): 40 mg via ORAL
  Filled 2020-08-07 (×3): qty 1

## 2020-08-07 MED ORDER — ORAL CARE MOUTH RINSE: 15.0000 mL | Freq: Once | OROMUCOSAL | Status: AC

## 2020-08-07 MED ORDER — OXYCODONE HCL 5 MG PO TABS
10.0000 mg | ORAL_TABLET | ORAL | Status: DC | PRN
Start: 2020-08-07 — End: 2020-08-08
  Administered 2020-08-08 (×2): 10 mg via ORAL

## 2020-08-07 MED ORDER — REMIFENTANIL HCL 1 MG IV SOLR
INTRAVENOUS | Status: DC | PRN
  Administered 2020-08-07: .1 ug/kg/min via INTRAVENOUS

## 2020-08-07 MED ORDER — EPHEDRINE 5 MG/ML INJ
INTRAVENOUS | Status: AC
  Filled 2020-08-07: qty 10

## 2020-08-07 MED ORDER — FENTANYL CITRATE (PF) 100 MCG/2ML IJ SOLN
INTRAMUSCULAR | Status: DC | PRN
  Administered 2020-08-07: 25 ug via INTRAVENOUS
  Administered 2020-08-07: 100 ug via INTRAVENOUS

## 2020-08-07 MED ORDER — MIDAZOLAM HCL 2 MG/2ML IJ SOLN
INTRAMUSCULAR | Status: DC | PRN
  Administered 2020-08-07: 1 mg via INTRAVENOUS

## 2020-08-07 MED ORDER — PROPOFOL 500 MG/50ML IV EMUL
INTRAVENOUS | Status: DC | PRN
  Administered 2020-08-07: 125 ug/kg/min via INTRAVENOUS

## 2020-08-07 MED ORDER — BISACODYL 5 MG PO TBEC
5.0000 mg | DELAYED_RELEASE_TABLET | Freq: Every day | ORAL | Status: DC | PRN
  Filled 2020-08-07: qty 1

## 2020-08-07 MED ORDER — BUPIVACAINE-EPINEPHRINE (PF) 0.5% -1:200000 IJ SOLN
INTRAMUSCULAR | Status: DC | PRN
  Administered 2020-08-07: 8 mL
  Administered 2020-08-07: 10 mL

## 2020-08-07 MED ORDER — ROCURONIUM BROMIDE 10 MG/ML (PF) SYRINGE
PREFILLED_SYRINGE | INTRAVENOUS | Status: AC
  Filled 2020-08-07: qty 20

## 2020-08-07 MED ORDER — OXYCODONE HCL 5 MG/5ML PO SOLN: 5.0000 mg | Freq: Once | ORAL | Status: DC | PRN

## 2020-08-07 MED ORDER — LINACLOTIDE 290 MCG PO CAPS
290.0000 ug | ORAL_CAPSULE | Freq: Every day | ORAL | Status: DC | PRN
  Filled 2020-08-07: qty 1

## 2020-08-07 MED ORDER — METHOCARBAMOL 750 MG PO TABS
ORAL_TABLET | ORAL | Status: AC
  Administered 2020-08-07: 750 mg via ORAL
  Filled 2020-08-07: qty 1

## 2020-08-07 MED ORDER — EPHEDRINE SULFATE 50 MG/ML IJ SOLN
INTRAMUSCULAR | Status: DC | PRN
  Administered 2020-08-07 (×2): 5 mg via INTRAVENOUS

## 2020-08-07 MED ORDER — SENNA 8.6 MG PO TABS
2.0000 | ORAL_TABLET | Freq: Two times a day (BID) | ORAL | Status: DC
  Administered 2020-08-07 – 2020-08-08 (×3): 17.2 mg via ORAL
  Filled 2020-08-07 (×4): qty 2

## 2020-08-07 MED ORDER — OXYCODONE HCL 5 MG PO TABS
ORAL_TABLET | ORAL | Status: AC
  Filled 2020-08-07: qty 1

## 2020-08-07 MED ORDER — LOSARTAN POTASSIUM-HCTZ 100-25 MG PO TABS: 1.0000 | ORAL_TABLET | Freq: Every day | ORAL | Status: DC

## 2020-08-07 MED ORDER — METHIMAZOLE 10 MG PO TABS
10.0000 mg | ORAL_TABLET | Freq: Every day | ORAL | Status: DC
  Administered 2020-08-08: 10 mg via ORAL
  Filled 2020-08-07: qty 1

## 2020-08-07 MED ORDER — ACETAMINOPHEN 10 MG/ML IV SOLN
INTRAVENOUS | Status: DC | PRN
  Administered 2020-08-07: 1000 mg via INTRAVENOUS

## 2020-08-07 MED ORDER — SUGAMMADEX SODIUM 200 MG/2ML IV SOLN
INTRAVENOUS | Status: DC | PRN
  Administered 2020-08-07: 200 mg via INTRAVENOUS

## 2020-08-07 MED ORDER — ACETAMINOPHEN 10 MG/ML IV SOLN
INTRAVENOUS | Status: AC
  Filled 2020-08-07: qty 100

## 2020-08-07 MED ORDER — SUCCINYLCHOLINE CHLORIDE 200 MG/10ML IV SOSY
PREFILLED_SYRINGE | INTRAVENOUS | Status: AC
  Filled 2020-08-07: qty 10

## 2020-08-07 MED ORDER — OXYCODONE HCL 5 MG PO TABS
ORAL_TABLET | ORAL | Status: AC
  Administered 2020-08-07: 10 mg via ORAL
  Filled 2020-08-07: qty 2

## 2020-08-07 MED ORDER — FLUTICASONE PROPIONATE 50 MCG/ACT NA SUSP
1.0000 | Freq: Every day | NASAL | Status: DC
  Administered 2020-08-07 – 2020-08-08 (×2): 1 via NASAL
  Filled 2020-08-07: qty 16

## 2020-08-07 MED ORDER — ONDANSETRON HCL 4 MG/2ML IJ SOLN
INTRAMUSCULAR | Status: AC
  Filled 2020-08-07: qty 2

## 2020-08-07 MED ORDER — FENTANYL CITRATE (PF) 100 MCG/2ML IJ SOLN
25.0000 ug | INTRAMUSCULAR | Status: DC | PRN
  Administered 2020-08-07: 50 ug via INTRAVENOUS

## 2020-08-07 MED ORDER — ATORVASTATIN CALCIUM 10 MG PO TABS
10.0000 mg | ORAL_TABLET | Freq: Every day | ORAL | Status: DC
  Administered 2020-08-07: 10 mg via ORAL
  Filled 2020-08-07 (×2): qty 1

## 2020-08-07 MED ORDER — METHOCARBAMOL 750 MG PO TABS
750.0000 mg | ORAL_TABLET | Freq: Four times a day (QID) | ORAL | Status: DC
Start: 2020-08-07 — End: 2020-08-08

## 2020-08-07 MED ORDER — ROCURONIUM BROMIDE 100 MG/10ML IV SOLN
INTRAVENOUS | Status: DC | PRN
  Administered 2020-08-07: 20 mg via INTRAVENOUS

## 2020-08-07 MED ORDER — PHENYLEPHRINE HCL (PRESSORS) 10 MG/ML IV SOLN
INTRAVENOUS | Status: DC | PRN
  Administered 2020-08-07 (×4): 100 ug via INTRAVENOUS

## 2020-08-07 MED ORDER — CHLORHEXIDINE GLUCONATE 0.12 % MT SOLN: 15.0000 mL | Freq: Once | OROMUCOSAL | Status: AC

## 2020-08-07 MED ORDER — DULOXETINE HCL 60 MG PO CPEP
60.0000 mg | ORAL_CAPSULE | Freq: Every evening | ORAL | Status: DC
  Filled 2020-08-07: qty 1

## 2020-08-07 MED ORDER — OXYCODONE HCL 5 MG PO TABS
5.0000 mg | ORAL_TABLET | ORAL | Status: DC | PRN
Start: 2020-08-07 — End: 2020-08-08
  Administered 2020-08-07: 5 mg via ORAL

## 2020-08-07 MED ORDER — DOCUSATE SODIUM 100 MG PO CAPS
100.0000 mg | ORAL_CAPSULE | Freq: Two times a day (BID) | ORAL | Status: DC
  Administered 2020-08-07 – 2020-08-08 (×3): 100 mg via ORAL
  Filled 2020-08-07 (×4): qty 1

## 2020-08-07 MED ORDER — LOSARTAN POTASSIUM 50 MG PO TABS
100.0000 mg | ORAL_TABLET | Freq: Every day | ORAL | Status: DC
  Administered 2020-08-07 – 2020-08-08 (×2): 100 mg via ORAL
  Filled 2020-08-07 (×3): qty 2

## 2020-08-07 MED ORDER — PROPOFOL 10 MG/ML IV BOLUS
INTRAVENOUS | Status: DC | PRN
  Administered 2020-08-07: 110 mg via INTRAVENOUS
  Administered 2020-08-07: 40 mg via INTRAVENOUS

## 2020-08-07 MED ORDER — LIDOCAINE HCL (PF) 2 % IJ SOLN
INTRAMUSCULAR | Status: AC
  Filled 2020-08-07: qty 5

## 2020-08-07 MED ORDER — OXYCODONE HCL 5 MG PO TABS: 5.0000 mg | ORAL_TABLET | Freq: Once | ORAL | Status: DC | PRN

## 2020-08-07 MED ORDER — HYDROCHLOROTHIAZIDE 25 MG PO TABS
25.0000 mg | ORAL_TABLET | Freq: Every day | ORAL | Status: DC
  Administered 2020-08-07 – 2020-08-08 (×2): 25 mg via ORAL
  Filled 2020-08-07 (×2): qty 1

## 2020-08-07 MED ORDER — ACETAMINOPHEN 500 MG PO TABS
1000.0000 mg | ORAL_TABLET | Freq: Four times a day (QID) | ORAL | Status: DC
  Administered 2020-08-07 – 2020-08-08 (×2): 1000 mg via ORAL

## 2020-08-07 MED ORDER — TRAZODONE HCL 50 MG PO TABS
50.0000 mg | ORAL_TABLET | Freq: Every evening | ORAL | Status: DC | PRN
  Filled 2020-08-07: qty 1

## 2020-08-07 MED ORDER — ONDANSETRON HCL 4 MG PO TABS: 4.0000 mg | ORAL_TABLET | Freq: Four times a day (QID) | ORAL | Status: DC | PRN

## 2020-08-07 MED ORDER — HYDROMORPHONE HCL 1 MG/ML IJ SOLN: 0.5000 mg | INTRAMUSCULAR | Status: DC | PRN

## 2020-08-07 MED ORDER — ONDANSETRON HCL 4 MG/2ML IJ SOLN
INTRAMUSCULAR | Status: DC | PRN
  Administered 2020-08-07: 4 mg via INTRAVENOUS

## 2020-08-07 MED ORDER — PANTOPRAZOLE SODIUM 40 MG PO TBEC
40.0000 mg | DELAYED_RELEASE_TABLET | Freq: Every day | ORAL | Status: DC
  Filled 2020-08-07: qty 1

## 2020-08-07 MED ORDER — OXYCODONE HCL 5 MG PO TABS
ORAL_TABLET | ORAL | Status: AC
  Administered 2020-08-07: 5 mg via ORAL
  Filled 2020-08-07: qty 1

## 2020-08-07 MED ORDER — INSULIN ASPART 100 UNIT/ML ~~LOC~~ SOLN: 0.0000 [IU] | Freq: Three times a day (TID) | SUBCUTANEOUS | Status: DC

## 2020-08-07 MED ORDER — CHLORHEXIDINE GLUCONATE 0.12 % MT SOLN
OROMUCOSAL | Status: AC
  Administered 2020-08-07: 15 mL via OROMUCOSAL
  Filled 2020-08-07: qty 15

## 2020-08-07 MED ORDER — SODIUM CHLORIDE 0.9 % IV SOLN
INTRAVENOUS | Status: DC | PRN
  Administered 2020-08-07: 50 ug/min via INTRAVENOUS

## 2020-08-07 MED ORDER — DULOXETINE HCL 30 MG PO CPEP
30.0000 mg | ORAL_CAPSULE | Freq: Every morning | ORAL | Status: DC
  Administered 2020-08-08: 30 mg via ORAL
  Filled 2020-08-07 (×2): qty 1

## 2020-08-07 SURGICAL SUPPLY — 83 items
ADH SKN CLS APL DERMABOND .7 (GAUZE/BANDAGES/DRESSINGS) ×1
AGENT HMST MTR 8 SURGIFLO (HEMOSTASIS) ×1
APL PRP STRL LF DISP 70% ISPRP (MISCELLANEOUS) ×1
APL SRG 60D 8 XTD TIP BNDBL (TIP)
BLADE BOVIE TIP EXT 4 (BLADE) ×2 IMPLANT
BUR NEURO DRILL SOFT 3.0X3.8M (BURR) ×2 IMPLANT
CABLE OR STIMULATOR 2X8 61 (WIRE) ×2 IMPLANT
CANISTER SUCT 1200ML W/VALVE (MISCELLANEOUS) ×2 IMPLANT
CHLORAPREP W/TINT 26 (MISCELLANEOUS) ×2 IMPLANT
CNTNR SPEC 2.5X3XGRAD LEK (MISCELLANEOUS) ×1
CONT SPEC 4OZ STER OR WHT (MISCELLANEOUS) ×1
CONT SPEC 4OZ STRL OR WHT (MISCELLANEOUS) ×1
CONTAINER SPEC 2.5X3XGRAD LEK (MISCELLANEOUS) ×1 IMPLANT
CONTROL REMOTE FREELINK ALPHA (NEUROSURGERY SUPPLIES) ×1 IMPLANT
COUNTER NEEDLE 20/40 LG (NEEDLE) ×2 IMPLANT
COVER LIGHT HANDLE STERIS (MISCELLANEOUS) ×4 IMPLANT
COVER WAND RF STERILE (DRAPES) ×2 IMPLANT
CUP MEDICINE 2OZ PLAST GRAD ST (MISCELLANEOUS) ×2 IMPLANT
DERMABOND ADVANCED (GAUZE/BANDAGES/DRESSINGS) ×1
DERMABOND ADVANCED .7 DNX12 (GAUZE/BANDAGES/DRESSINGS) ×1 IMPLANT
DRAPE C-ARM 42X72 X-RAY (DRAPES) ×4 IMPLANT
DRAPE C-ARMOR (DRAPES) IMPLANT
DRAPE LAPAROTOMY 100X77 ABD (DRAPES) ×2 IMPLANT
DRAPE MICROSCOPE SPINE 48X150 (DRAPES) IMPLANT
DRAPE SURG 17X11 SM STRL (DRAPES) ×2 IMPLANT
DRSG TEGADERM 4X4.75 (GAUZE/BANDAGES/DRESSINGS) ×1 IMPLANT
DRSG TELFA 4X3 1S NADH ST (GAUZE/BANDAGES/DRESSINGS) ×2 IMPLANT
DURASEAL APPLICATOR TIP (TIP) IMPLANT
DURASEAL SPINE SEALANT 3ML (MISCELLANEOUS) IMPLANT
ELECT CAUTERY BLADE TIP 2.5 (TIP) ×2
ELECT EZSTD 165MM 6.5IN (MISCELLANEOUS) ×2
ELECT REM PT RETURN 9FT ADLT (ELECTROSURGICAL) ×2
ELECTRODE CAUTERY BLDE TIP 2.5 (TIP) ×1 IMPLANT
ELECTRODE EZSTD 165MM 6.5IN (MISCELLANEOUS) ×1 IMPLANT
ELECTRODE REM PT RTRN 9FT ADLT (ELECTROSURGICAL) ×1 IMPLANT
ELEVATER PASSER (SPINAL CORD STIMULATOR) ×2
FEE INTRAOP MONITOR IMPULS NCS (MISCELLANEOUS) IMPLANT
GAUZE SPONGE 4X4 12PLY STRL (GAUZE/BANDAGES/DRESSINGS) IMPLANT
GLOVE BIOGEL PI IND STRL 7.0 (GLOVE) ×1 IMPLANT
GLOVE BIOGEL PI IND STRL 8 (GLOVE) ×1 IMPLANT
GLOVE BIOGEL PI INDICATOR 7.0 (GLOVE) ×1
GLOVE BIOGEL PI INDICATOR 8 (GLOVE) ×1
GLOVE SURG SYN 7.0 (GLOVE) ×4 IMPLANT
GLOVE SURG SYN 7.0 PF PI (GLOVE) ×2 IMPLANT
GLOVE SURG SYN 8.0 (GLOVE) ×2 IMPLANT
GLOVE SURG SYN 8.0 PF PI (GLOVE) ×1 IMPLANT
GOWN STRL REUS W/ TWL LRG LVL3 (GOWN DISPOSABLE) ×1 IMPLANT
GOWN STRL REUS W/ TWL XL LVL3 (GOWN DISPOSABLE) ×2 IMPLANT
GOWN STRL REUS W/TWL LRG LVL3 (GOWN DISPOSABLE) ×2
GOWN STRL REUS W/TWL XL LVL3 (GOWN DISPOSABLE) ×4
GRADUATE 1200CC STRL 31836 (MISCELLANEOUS) ×1 IMPLANT
INTRAOP MONITOR FEE IMPULS NCS (MISCELLANEOUS) ×1
INTRAOP MONITOR FEE IMPULSE (MISCELLANEOUS) ×2
KIT CHARGING (KITS) ×1
KIT CHARGING PRECISION NEURO (KITS) IMPLANT
KIT IPG ALPHA WAVEWRITER (Stimulator) ×1 IMPLANT
KIT TURNOVER KIT A (KITS) ×2 IMPLANT
LEAD COVER EDGE 50CM STIM KIT (Lead) ×1 IMPLANT
MANIFOLD NEPTUNE II (INSTRUMENTS) ×2 IMPLANT
MARKER SKIN DUAL TIP RULER LAB (MISCELLANEOUS) ×2 IMPLANT
NDL SAFETY ECLIPSE 18X1.5 (NEEDLE) ×1 IMPLANT
NEEDLE HYPO 18GX1.5 SHARP (NEEDLE) ×2
NEEDLE HYPO 22GX1.5 SAFETY (NEEDLE) ×2 IMPLANT
NS IRRIG 1000ML POUR BTL (IV SOLUTION) ×2 IMPLANT
PACK LAMINECTOMY NEURO (CUSTOM PROCEDURE TRAY) ×2 IMPLANT
PAD ARMBOARD 7.5X6 YLW CONV (MISCELLANEOUS) ×2 IMPLANT
PASSER ELEVATOR (SPINAL CORD STIMULATOR) IMPLANT
SPOGE SURGIFLO 8M (HEMOSTASIS) ×2
SPONGE SURGIFLO 8M (HEMOSTASIS) IMPLANT
STAPLER SKIN PROX 35W (STAPLE) IMPLANT
SUT ETHILON 3-0 FS-10 30 BLK (SUTURE) ×4
SUT POLYSORB 2-0 5X18 GS-10 (SUTURE) ×7 IMPLANT
SUT SILK 2 0 SH (SUTURE) ×5 IMPLANT
SUT VIC AB 0 CT1 18XCR BRD 8 (SUTURE) ×2 IMPLANT
SUT VIC AB 0 CT1 8-18 (SUTURE) ×6
SUTURE EHLN 3-0 FS-10 30 BLK (SUTURE) ×2 IMPLANT
SYR 10ML LL (SYRINGE) ×4 IMPLANT
SYR 20ML LL LF (SYRINGE) ×2 IMPLANT
SYR 30ML LL (SYRINGE) ×4 IMPLANT
SYR 3ML LL SCALE MARK (SYRINGE) ×2 IMPLANT
TOOL LONG TUNNEL (SPINAL CORD STIMULATOR) ×1 IMPLANT
TOWEL OR 17X26 4PK STRL BLUE (TOWEL DISPOSABLE) ×4 IMPLANT
TUBING CONNECTING 10 (TUBING) ×2 IMPLANT

## 2020-08-07 NOTE — Progress Notes (Signed)
Pt OOB ambulating to BR voided x2 without difficulty.

## 2020-08-07 NOTE — H&P (Signed)
Christine Higgins is an 64 y.o. female.   Chief Complaint: Chronic pain HPI: Christine Higgins is here for reevaluation after having seen previously for ongoing back and leg pain. She was being treated by the pain clinic with [name] and was having variable relief with repeat injections. She ultimately did undergo a spinal cord stimulator trial recently and states that she got great relief, greater than 80%, of her pain with this. She was able to do activities around the house. Currently, given the trial has been removed, the pain is returned and she is unable to do those activities. The pain currently is in the back and going into both legs. She feels the pain is all the way down to the feet. She does have some numbness on the left toes. She is here for permanent stimulator placement     Past Medical History:  Diagnosis Date  . Anxiety   . Asthma   . Breast mass    LEFT x 3 months per pt and palpated by physician  . Cervical disc disease   . Chronic pain syndrome   . Degenerative disc disease, lumbar   . Depression   . Fibromyalgia   . Fibromyalgia   . GERD (gastroesophageal reflux disease)   . Glaucoma   . Graves disease   . Hemorrhoids   . Hyperlipidemia   . Hypertension   . Thyroid disease     Past Surgical History:  Procedure Laterality Date  . BACK SURGERY     sumbar  . carpel tunn Right   . carpel tunnel Left   . CESAREAN SECTION    . COLONOSCOPY    . ESOPHAGOGASTRODUODENOSCOPY (EGD) WITH PROPOFOL N/A 06/26/2017   Procedure: ESOPHAGOGASTRODUODENOSCOPY (EGD) WITH PROPOFOL;  Surgeon: Lollie Sails, MD;  Location: [facility] ENDOSCOPY;  Service: Endoscopy;  Laterality: N/A;  . EXCISION MORTON'S NEUROMA Left 02/05/2017   Procedure: EXCISION MORTON'S NEUROMA;  Surgeon: Samara Deist, DPM;  Location: [place];  Service: Podiatry;  Laterality: Left;  iva with local  . HAND SURGERY Right    scar tissue removal  . JOINT REPLACEMENT     right hip arthroplasty 08/25/15  . KNEE  ARTHROPLASTY Left 08/11/2017   Procedure: COMPUTER ASSISTED TOTAL KNEE ARTHROPLASTY;  Surgeon: Dereck Leep, MD;  Location: [facility] ORS;  Service: Orthopedics;  Laterality: Left;  . KNEE ARTHROSCOPY Right 07/02/2018   Procedure: ARTHROSCOPY KNEE, Medial and Lateral  Chondroplasty;  Surgeon: Dereck Leep, MD;  Location: [facility] ORS;  Service: Orthopedics;  Laterality: Right;  . NECK SURGERY     "disk implant"  . REVERSE SHOULDER ARTHROPLASTY Left 06/24/2019   Procedure: REVERSE SHOULDER ARTHROPLASTY;  Surgeon: Corky Mull, MD;  Location: [facility] ORS;  Service: Orthopedics;  Laterality: Left;  . SHOULDER SURGERY Right    spur removal  . TOTAL HIP ARTHROPLASTY Right     Family History  Problem Relation Age of Onset  . Cancer Mother   . Heart disease Father   . Breast cancer Neg Hx    Social History:  reports that she quit smoking about 27 years ago. She has never used smokeless tobacco. She reports previous drug use. She reports that she does not drink alcohol.  Allergies:  Allergies  Allergen Reactions  . Cephalexin Hives  . Ibuprofen Nausea And Vomiting    Flu like symptoms  . Shellfish Allergy Nausea And Vomiting  . Aspirin Nausea And Vomiting    Medications Prior to Admission  Medication Sig Dispense Refill  .  Apremilast (OTEZLA) 30 MG TABS Take 30 mg by mouth in the morning and at bedtime.     . Ascorbic Acid (VITAMIN C) 1000 MG tablet Take 1,000 mg by mouth daily.     Marland Kitchen atorvastatin (LIPITOR) 10 MG tablet Take 10 mg by mouth daily after supper.     . Beta Carotene (VITAMIN A) 25000 UNIT capsule Take 25,000 Units by mouth 2 (two) times a week.    . budesonide-formoterol (SYMBICORT) 160-4.5 MCG/ACT inhaler Inhale 2 puffs into the lungs 2 (two) times daily.    . calcium carbonate (OS-CAL) 600 MG TABS tablet Take 1 tablet by mouth 2 (two) times a week.     . celecoxib (CELEBREX) 200 MG capsule Take 200 mg by mouth 2 (two) times daily.     . Cholecalciferol (VITAMIN D3) 25 MCG  (1000 UT) CAPS Take 2,000 Units by mouth every evening.     . clonazePAM (KLONOPIN) 1 MG tablet Take 1 tablet (1 mg total) by mouth at bedtime as needed (for sleep/anxiety). 14 tablet 0  . clotrimazole-betamethasone (LOTRISONE) cream Apply 1 application topically 2 (two) times daily as needed (irritation).    . Coenzyme Q10 (COQ-10) 200 MG CAPS Take 2 capsules by mouth every other day.     . Cyanocobalamin (VITAMIN B-12) 5000 MCG TBDP Take 5,000 mcg by mouth every other day.     . cyclobenzaprine (FLEXERIL) 5 MG tablet Take 5 mg by mouth 2 (two) times daily as needed for muscle spasms.     . DULoxetine (CYMBALTA) 30 MG capsule Take 30 mg by mouth in the morning.     . DULoxetine (CYMBALTA) 60 MG capsule Take 60 mg by mouth every evening.     . fexofenadine (ALLEGRA) 180 MG tablet Take 180 mg by mouth daily.    . fluticasone (FLONASE) 50 MCG/ACT nasal spray Place 1 spray into both nostrils daily.     Marland Kitchen gabapentin (NEURONTIN) 300 MG capsule Take 1 capsule (300 mg total) by mouth 3 (three) times daily. 90 capsule 5  . hydrALAZINE (APRESOLINE) 25 MG tablet Take 25 mg by mouth 3 (three) times daily as needed (blood pressure 160/100 or higher).     . hydrOXYzine (ATARAX/VISTARIL) 10 MG tablet Take up to 3 tablets (30mg  total) by mouth at bedtime as needed for itching.  risk of drowsiness, increased risk of falling, and to take lowest dose possible for the shortest period of time. (Patient taking differently: Take 10 mg by mouth in the morning and at bedtime. ) 30 tablet 0  . linaclotide (LINZESS) 290 MCG CAPS capsule Take 290 mcg by mouth daily as needed (constipation).     Marland Kitchen losartan-hydrochlorothiazide (HYZAAR) 100-25 MG tablet Take 1 tablet by mouth daily.    . methimazole (TAPAZOLE) 10 MG tablet Take 10 mg by mouth daily.     . Multiple Vitamins-Minerals (ALIVE WOMENS ENERGY PO) Take 1 tablet by mouth daily.    . Omega-3 Fatty Acids (FISH OIL) 1000 MG CAPS Take 1,000 mg by mouth 2 (two) times a week.     . Prenatal MV-Min-Fe Fum-FA-DHA (PRENATAL 1 PO) Take 1 tablet by mouth every other day.     . Probiotic Product (PROBIOTIC PO) Take 1 capsule by mouth every other day.     . propranolol (INDERAL) 40 MG tablet Take 40 mg by mouth 2 (two) times daily.  11  . Propylene Glycol (SYSTANE BALANCE) 0.6 % SOLN Place 1 drop into both eyes 2 (two) times daily.     Marland Kitchen  sucralfate (CARAFATE) 1 g tablet Take 1 g by mouth 2 (two) times daily.    . traZODone (DESYREL) 150 MG tablet Take 75 mg by mouth at bedtime as needed for sleep.     . TURMERIC PO Take 1,000 mg by mouth every other day.     Marland Kitchen VITAMIN E PO Take 400 Units by mouth every other day.     . Acetaminophen (TYLENOL ARTHRITIS EXT RELIEF PO) Take 2 tablets by mouth every 8 (eight) hours as needed. (Patient not taking: Reported on 07/19/2020)    . esomeprazole (NEXIUM) 40 MG capsule Take 40 mg by mouth 2 (two) times daily.     . Fluocinolone Acetonide Body 0.01 % OIL APPLY TO SCALP TWICE DAILY AS NEEDED FOR ITCH. 118.28 mL 3  . hydrocortisone 2.5 % cream APPLY TO AFFECTED AREAS UNDER THE BREAST TWICE DAILY AS NEEDED FOR RASH (Patient not taking: Reported on 07/19/2020) 84 g 2  . ketoconazole (NIZORAL) 2 % cream Apply to affected areas under the breast as needed for rash (Patient not taking: Reported on 07/19/2020) 60 g 11  . Menthol, Topical Analgesic, (ICY HOT EX) Apply 1 application topically 3 (three) times daily as needed (for pain.). (Patient not taking: Reported on 07/19/2020)    . Multiple Vitamin (ANTI-OXIDANT PO) Take by mouth every other day.    . ofloxacin (OCUFLOX) 0.3 % ophthalmic solution  (Patient not taking: Reported on 07/19/2020)    . terbinafine (LAMISIL) 250 MG tablet Take one tablet daily for 7 days. Repeat at the beginning of months 3, 5, and 7 (Patient not taking: Reported on 07/19/2020) 28 tablet 0  . [START ON 08/16/2020] traMADol (ULTRAM) 50 MG tablet Take 1 tablet (50 mg total) by mouth every 8 (eight) hours as needed for severe  pain. 90 tablet 2  . triamcinolone ointment (KENALOG) 0.1 % Apply 1 application topically 2 (two) times daily. For 2 weeks. Avoid applying to face, groin, and axilla. Use as directed. Risk of skin atrophy with long-term use reviewed. (Patient not taking: Reported on 07/19/2020) 454 g 1    No results found for this or any previous visit (from the past 48 hour(s)). No results found.  Review of Systems General ROS: Negative Respiratory ROS: Negative Cardiovascular ROS: Negative Gastrointestinal ROS: Negative Genito-Urinary ROS: Negative Musculoskeletal ROS: Positive for back pain Neurological ROS: Positive for leg pain Dermatological ROS: Negative    There were no vitals taken for this visit. Physical Exam  General appearance: Alert, cooperative, in no acute distress Back: Well-healed midline incision in the lower lumbar region  Neurologic exam:  Mental status: alertness: alert, affect: normal Speech: fluent and clear Motor:strength symmetric 5/5 in bilateral hip flexion, knee flexion, knee extension with dorsi, plantar flexion Sensory: intact to light touch in bilateral lower extremities Gait: normal   Imaging: MRI lumbar spine: There is evidence of a previous L3 decompression. There is multilevel degenerative disease which is moderate to severe throughout. There is some mild central stenosis at L4-5.  MRI thoracic spine: There is degenerative disease noted. There is some posterior ligament hypertrophy at T11-12 which contributes to mild spinal stenosis. There is no stenosis noted above this at level of possible implant.    Assessment/Plan Proceed with thoracic SCS via laminectomy  [name], MD 08/07/2020, 6:41 AM

## 2020-08-07 NOTE — Op Note (Signed)
Operative Note   SURGERY DATE:  08/07/2020   PRE-OP DIAGNOSIS:  Chronic pain syndrome   POST-OP DIAGNOSIS: Post-Op Diagnosis Codes: Chronic pain syndrome   Procedure(s) with comments: Thoracic Laminectomy with SCS Paddle Placement Left flank pulse generator placement  SURGEON:     * Nathaniel Man, MD       Patsey Berthold, NP - Asst   ANESTHESIA: General    OPERATIVE FINDINGS: Successful placement of thoracic spinal cord stimulator   Indication Christine Higgins was seen in clinic on 11/23 after having a successful thoracic SCS trial for back and leg pain. MRI of the spine revealed no concerning stenosis at the level of the implant. The patient wished to proceed to permanent implant to decrease medication use and achieve better pain control. Risks including damage to spinal cord, weakness, hematoma, infection, failure of pain relief, post-operative pain, need for revision, stroke, heart attack, pneumonia, and spinal cord injury were discussed.    Procedure The patient was brought to the operating room where vascular access was obtained and intubated by the anesthesia service. Neuromonitoring electrodes were placed for SSEP and EMG with good baselines obtained. Antibiotics were given.  The patient was turned prone onto a Wilson frame. Fluoroscopy was used to confirm planned incision in thoracic area. The patient was prepped and draped in a sterile fashion. A hard time out was performed. Local anesthetic was instilled into incisions.   The thoracic incision was opened sharply over the midline and continued through the fascia to the level of the T10 lamina. The lamina above was exposed and fluoroscopy was used to confirm the T9-10 interspace. Next, a combination of rongeurs and drill were used to remove the caudal half of the T9 lamina along with underlying ligament to expose the dura. The opening was widened to allow for paddle placement. The [employer] paddle was inserted gently in the  rostral direction placing it in the midline of the exposure. Fluoroscopy was used to confirm midline placement and levels with the tip of the paddle resting at the top of the T8 vertebral body. Monitoring confirmed that we were getting adequate coverage in both legs given the distribution of the patient's pain. The leads were then secured to the muscle with a strain relief using silk suture.   The left flank incision was opened sharply down to level of fascia and then expanded to allow for placement of a battery. The leads were then tunneled from the thoracic incision to the flank. There, the leads were connected to the battery. The battery was interrogated at this time and impedence found to be adequate. The battery was then placed into the flank incision and secured with silk suture. Hemostasis was achieved. Each incision was irrigated with antibiotic saline and vancomycin powder placed.   A final fluoroscopic image was taken show good placement of paddle. Then, each incision was closed with combination of 0, 2-0, and 3-0 vicryls. The skin was closed with 3-0 Nylon in the thoracic area and Dermabond on flank. Sterile dressings were applied. The patient was returned to supine position and extubated. The patient was seen to be moving all extremities symmetrically and was taken to PACU for recovery. The family was updated and all questions answered.   ESTIMATED BLOOD LOSS:   100 cc   SPECIMENS None   IMPLANT LEAD COVER EDGE 50CM STIM KIT - [id number]  Inventory Item: LEAD COVER EDGE 50CM STIM KIT Serial no.: [id number] Model/Cat no.: [id number]  Implant name: LEAD COVER EDGE  50CM STIM KIT - [id number] Laterality: N/A Area: Spine Thoracic  Manufacturer: [place] Date of Manufacture:    Action: Implanted Number Used: 1   Device Identifier:  Device Identifier Type:     KIT IPG ALPHA WAVEWRITER - [id number]  Inventory Item: KIT IPG ALPHA WAVEWRITER Serial no.: [id number] Model/Cat no.: [id number]   Implant name: KIT IPG ALPHA WAVEWRITER - [id number] Laterality: N/A Area: Spine Thoracic  Manufacturer: Wilkie Aye Date of Manufacture:    Action: Implanted Number Used: 1   Device Identifier:  Device Identifier Type:        I performed the case in its entirety with assistance of NP, Christine Zdeb   [name], [place]

## 2020-08-07 NOTE — Plan of Care (Signed)
Encourage pt to get OOB and cough and deep breathe

## 2020-08-07 NOTE — Anesthesia Procedure Notes (Addendum)
Procedure Name: Intubation Date/Time: 08/07/2020 7:26 AM Performed by: Hedda Cogdell, CRNA Pre-anesthesia Checklist: Patient identified, Patient being monitored, Timeout performed, Emergency Drugs available and Suction available Patient Re-evaluated:Patient Re-evaluated prior to induction Oxygen Delivery Method: Circle system utilized Preoxygenation: Pre-oxygenation with 100% oxygen Induction Type: IV induction Ventilation: Mask ventilation without difficulty and Oral airway inserted - appropriate to patient size Laryngoscope Size: 3 and McGraph Grade View: Grade I Tube type: Oral Tube size: 7.0 mm Number of attempts: 1 Airway Equipment and Method: Stylet,  Video-laryngoscopy and Bite block Placement Confirmation: ETT inserted through vocal cords under direct vision,  positive ETCO2 and breath sounds checked- equal and bilateral Secured at: 21 cm Tube secured with: Tape Dental Injury: Teeth and Oropharynx as per pre-operative assessment

## 2020-08-07 NOTE — Interval H&P Note (Signed)
History and Physical Interval Note:  08/07/2020 6:51 AM  Christine Higgins  has presented today for surgery, with the diagnosis of chronic pain syndrome g89.4.  The various methods of treatment have been discussed with the patient and family. After consideration of risks, benefits and other options for treatment, the patient has consented to  Procedure(s): [place], PULSE GENERATOR (N/A) as a surgical intervention.  The patient's history has been reviewed, patient examined, no change in status, stable for surgery.  I have reviewed the patient's chart and labs.  Questions were answered to the patient's satisfaction.     Exam: CV: Regular rate and rhythm Pulm: Clear to auscultation  [name]

## 2020-08-07 NOTE — Progress Notes (Signed)
RUE>LUE upon assessment,RLE> LLE,  Pt well controlled with pain meds, instructed pt to wear brace when up and ambulating.

## 2020-08-07 NOTE — Anesthesia Preprocedure Evaluation (Signed)
Anesthesia Evaluation  Patient identified by MRN, date of birth, ID band Patient awake    Reviewed: Allergy & Precautions, H&P , NPO status , Patient's Chart, lab work & pertinent test results  History of Anesthesia Complications Negative for: history of anesthetic complications  Airway Mallampati: II  TM Distance: >3 FB     Dental  (+) Edentulous Upper   Pulmonary asthma , neg sleep apnea, neg COPD, former smoker,    breath sounds clear to auscultation       Cardiovascular hypertension, (-) angina(-) Past MI and (-) Cardiac Stents (-) dysrhythmias  Rhythm:regular Rate:Normal     Neuro/Psych  Headaches, PSYCHIATRIC DISORDERS Anxiety Depression Lumbar radiculopathy    GI/Hepatic Neg liver ROS, GERD  Controlled and Medicated,  Endo/Other  diabetes  Renal/GU      Musculoskeletal   Abdominal   Peds  Hematology negative hematology ROS (+)   Anesthesia Other Findings Past Medical History: No date: Anxiety No date: Asthma No date: Breast mass     Comment:  LEFT x 3 months per pt and palpated by physician No date: Cervical disc disease No date: Chronic pain syndrome No date: Degenerative disc disease, lumbar No date: Depression No date: Fibromyalgia No date: Fibromyalgia No date: GERD (gastroesophageal reflux disease) No date: Glaucoma No date: Graves disease No date: Hemorrhoids No date: Hyperlipidemia No date: Hypertension No date: Thyroid disease  Past Surgical History: No date: BACK SURGERY     Comment:  sumbar No date: carpel tunn; Right No date: carpel tunnel; Left No date: CESAREAN SECTION No date: COLONOSCOPY 06/26/2017: ESOPHAGOGASTRODUODENOSCOPY (EGD) WITH PROPOFOL; N/A     Comment:  Procedure: ESOPHAGOGASTRODUODENOSCOPY (EGD) WITH               PROPOFOL;  Surgeon: Lollie Sails, MD;  Location:               [facility] ENDOSCOPY;  Service: Endoscopy;  Laterality: N/A; 02/05/2017: EXCISION MORTON'S  NEUROMA; Left     Comment:  Procedure: EXCISION MORTON'S NEUROMA;  Surgeon: Samara Deist, DPM;  Location: [place];  Service:               Podiatry;  Laterality: Left;  iva with local No date: HAND SURGERY; Right     Comment:  scar tissue removal No date: JOINT REPLACEMENT     Comment:  right hip arthroplasty 08/25/15 08/11/2017: KNEE ARTHROPLASTY; Left     Comment:  Procedure: COMPUTER ASSISTED TOTAL KNEE ARTHROPLASTY;                Surgeon: Dereck Leep, MD;  Location: [facility]S;                Service: Orthopedics;  Laterality: Left; 07/02/2018: KNEE ARTHROSCOPY; Right     Comment:  Procedure: ARTHROSCOPY KNEE, Medial and Lateral                Chondroplasty;  Surgeon: Dereck Leep, MD;  Location:               [facility];  Service: Orthopedics;  Laterality: Right; No date: NECK SURGERY     Comment:  "disk implant" 06/24/2019: REVERSE SHOULDER ARTHROPLASTY; Left     Comment:  Procedure: REVERSE SHOULDER ARTHROPLASTY;  Surgeon:               Corky Mull, MD;  Location: [facility] ORS;  Service:  Orthopedics;  Laterality: Left; No date: SHOULDER SURGERY; Right     Comment:  spur removal No date: TOTAL HIP ARTHROPLASTY; Right     Reproductive/Obstetrics negative OB ROS                             Anesthesia Physical Anesthesia Plan  ASA: III  Anesthesia Plan: General ETT   Post-op Pain Management:    Induction:   PONV Risk Score and Plan: Ondansetron, Dexamethasone, Midazolam, Treatment may vary due to age or medical condition, TIVA and Propofol infusion  Airway Management Planned:   Additional Equipment:   Intra-op Plan:   Post-operative Plan:   Informed Consent: I have reviewed the patients History and Physical, chart, labs and discussed the procedure including the risks, benefits and alternatives for the proposed anesthesia with the patient or authorized representative who has indicated his/her  understanding and acceptance.     Dental Advisory Given  Plan Discussed with: Anesthesiologist, CRNA and Surgeon  Anesthesia Plan Comments:         Anesthesia Quick Evaluation

## 2020-08-07 NOTE — Consult Note (Signed)
Pharmacy Antibiotic Note  Christine Higgins is a 64 y.o. female admitted on 08/07/2020 with surgical prophylaxis.    Pharmacy has been consulted for cefazolin dosing.  Plan: Cefazolin 2g IV x 1  Please contact pharmacy if additional dosing is needed for prolonged surgery.    Temp (24hrs), Avg:98 F (36.7 C), Min:98 F (36.7 C), Max:98 F (36.7 C)  Allergies  Allergen Reactions   Cephalexin Hives   Ibuprofen Nausea And Vomiting    Flu like symptoms   Shellfish Allergy Nausea And Vomiting   Aspirin Nausea And Vomiting    Thank you for allowing pharmacy to be a part of this patients care.  Lu Duffel, PharmD, BCPS Clinical Pharmacist 08/07/2020 7:15 AM

## 2020-08-07 NOTE — Interval H&P Note (Signed)
History and Physical Interval Note:  08/07/2020 6:43 AM  Christine Higgins  has presented today for surgery, with the diagnosis of chronic pain syndrome g89.4.  The various methods of treatment have been discussed with the patient and family. After consideration of risks, benefits and other options for treatment, the patient has consented to  Procedure(s): [place], PULSE GENERATOR (N/A) as a surgical intervention.  The patient's history has been reviewed, patient examined, no change in status, stable for surgery.  I have reviewed the patient's chart and labs.  Questions were answered to the patient's satisfaction.     [name]

## 2020-08-07 NOTE — Progress Notes (Signed)
Post operative check completed and pt able to move all extremities. Full strength to lower extremities. She will be admitted for post operative observation.

## 2020-08-07 NOTE — Transfer of Care (Signed)
Immediate Anesthesia Transfer of Care Note  Patient: Christine Higgins  Procedure(s) Performed: THORACIC SPINAL CORD STIMULATOR VIA LAMINECTOMY, PULSE GENERATOR (N/A )  Patient Location: PACU  Anesthesia Type:General  Level of Consciousness: sedated  Airway & Oxygen Therapy: Patient Spontanous Breathing and Patient connected to face mask oxygen  Post-op Assessment: Report given to RN and Post -op Vital signs reviewed and stable  Post vital signs: Reviewed and stable  Last Vitals:  Vitals Value Taken Time  BP 96/63 08/07/20 1007  Temp    Pulse 66 08/07/20 1010  Resp 12 08/07/20 1010  SpO2 100 % 08/07/20 1010  Vitals shown include unvalidated device data.  Last Pain:  Vitals:   08/07/20 0644  TempSrc: Tympanic  PainSc:          Complications: No complications documented.

## 2020-08-08 ENCOUNTER — Other Ambulatory Visit: Payer: Self-pay | Admitting: Dermatology

## 2020-08-08 ENCOUNTER — Encounter: Payer: Self-pay | Admitting: Neurosurgery

## 2020-08-08 DIAGNOSIS — I10 Essential (primary) hypertension: Secondary | ICD-10-CM | POA: Diagnosis not present

## 2020-08-08 DIAGNOSIS — R32 Unspecified urinary incontinence: Secondary | ICD-10-CM | POA: Diagnosis not present

## 2020-08-08 DIAGNOSIS — J45909 Unspecified asthma, uncomplicated: Secondary | ICD-10-CM | POA: Diagnosis not present

## 2020-08-08 DIAGNOSIS — G894 Chronic pain syndrome: Secondary | ICD-10-CM | POA: Diagnosis not present

## 2020-08-08 DIAGNOSIS — Z79899 Other long term (current) drug therapy: Secondary | ICD-10-CM | POA: Diagnosis not present

## 2020-08-08 DIAGNOSIS — Z96612 Presence of left artificial shoulder joint: Secondary | ICD-10-CM | POA: Diagnosis not present

## 2020-08-08 DIAGNOSIS — Z96641 Presence of right artificial hip joint: Secondary | ICD-10-CM | POA: Diagnosis not present

## 2020-08-08 DIAGNOSIS — R21 Rash and other nonspecific skin eruption: Secondary | ICD-10-CM

## 2020-08-08 LAB — GLUCOSE, CAPILLARY: Glucose-Capillary: 185 mg/dL — ABNORMAL HIGH (ref 70–99)

## 2020-08-08 MED ORDER — DOCUSATE SODIUM 100 MG PO CAPS: 100.0000 mg | ORAL_CAPSULE | Freq: Two times a day (BID) | ORAL | 0 refills | Status: DC

## 2020-08-08 MED ORDER — ACETAMINOPHEN 500 MG PO TABS
ORAL_TABLET | ORAL | Status: AC
  Administered 2020-08-08: 1000 mg via ORAL
  Filled 2020-08-08: qty 2

## 2020-08-08 MED ORDER — ACETAMINOPHEN 500 MG PO TABS
ORAL_TABLET | ORAL | Status: AC
  Filled 2020-08-08: qty 2

## 2020-08-08 MED ORDER — METHOCARBAMOL 750 MG PO TABS
ORAL_TABLET | ORAL | Status: AC
  Administered 2020-08-08: 750 mg via ORAL
  Filled 2020-08-08: qty 1

## 2020-08-08 MED ORDER — OXYCODONE HCL 5 MG PO TABS: 5.0000 mg | ORAL_TABLET | Freq: Four times a day (QID) | ORAL | 0 refills | Status: AC | PRN

## 2020-08-08 MED ORDER — METHOCARBAMOL 750 MG PO TABS: 750.0000 mg | ORAL_TABLET | Freq: Four times a day (QID) | ORAL | 0 refills | Status: DC

## 2020-08-08 MED ORDER — OXYCODONE HCL 5 MG PO TABS
ORAL_TABLET | ORAL | Status: AC
  Filled 2020-08-08: qty 2

## 2020-08-08 MED ORDER — SENNA 8.6 MG PO TABS: 2.0000 | ORAL_TABLET | Freq: Two times a day (BID) | ORAL | 0 refills | Status: DC

## 2020-08-08 MED ORDER — BISACODYL 5 MG PO TBEC: 5.0000 mg | DELAYED_RELEASE_TABLET | Freq: Every day | ORAL | 0 refills | Status: DC | PRN

## 2020-08-08 MED ORDER — ACETAMINOPHEN 500 MG PO TABS: 1000.0000 mg | ORAL_TABLET | Freq: Four times a day (QID) | ORAL | 0 refills | Status: DC

## 2020-08-08 MED ORDER — OXYCODONE HCL 5 MG PO TABS: 5.0000 mg | ORAL_TABLET | Freq: Four times a day (QID) | ORAL | 0 refills | Status: DC | PRN

## 2020-08-08 MED ORDER — POLYETHYLENE GLYCOL 3350 17 G PO PACK: 17.0000 g | PACK | Freq: Every day | ORAL | 0 refills | Status: DC | PRN

## 2020-08-08 MED ORDER — GABAPENTIN 300 MG PO CAPS
ORAL_CAPSULE | ORAL | Status: AC
  Filled 2020-08-08: qty 1

## 2020-08-08 NOTE — Anesthesia Postprocedure Evaluation (Signed)
Anesthesia Post Note  Patient: Christine Higgins  Procedure(s) Performed: THORACIC SPINAL CORD STIMULATOR VIA LAMINECTOMY, PULSE GENERATOR (N/A )  Patient location during evaluation: PACU Anesthesia Type: General Level of consciousness: awake and alert Pain management: pain level controlled Vital Signs Assessment: post-procedure vital signs reviewed and stable Respiratory status: spontaneous breathing, nonlabored ventilation and respiratory function stable Cardiovascular status: blood pressure returned to baseline and stable Postop Assessment: no apparent nausea or vomiting Anesthetic complications: no   No complications documented.   Last Vitals:  Vitals:   08/08/20 0945 08/08/20 0950  BP: 133/74 136/74  Pulse: 84 76  Resp:  18  Temp:  37.1 C  SpO2:  98%    Last Pain:  Vitals:   08/08/20 1022  TempSrc:   PainSc: [place]

## 2020-08-08 NOTE — Discharge Summary (Signed)
Procedure: insertion of thoracic SCS  Procedure date: 08/07/2020 Diagnosis: chronic pain syndrome    History: Christine Higgins is s/p insertion of SCS with paddle leads POD1: Ms. Mormino reports that she is doing quite well this morning and is eager for discharge home. She has ambulated and voided. Pain well controlled.  POD0: Tolerated procedure well. Evaluated in post op recovery still disoriented from anesthesia but able to answer questions and obey commands.   Physical Exam: Vitals:   08/08/20 0627 08/08/20 0837  BP: 133/74   Pulse: 84   Resp: 16   Temp: 99 F (37.2 C) 98.8 F (37.1 C)  SpO2: 95%     General: Alert and oriented, sitting in bed Strength:5/5 throughout  Sensation: intact and symmetric throughout Skin: incision clean, dry, intact  Data:  No results for input(s): NA, K, CL, CO2, BUN, CREATININE, LABGLOM, GLUCOSE, CALCIUM in the last 168 hours. No results for input(s): AST, ALT, ALKPHOS in the last 168 hours.  Invalid input(s): TBILI   No results for input(s): WBC, HGB, HCT, PLT in the last 168 hours. No results for input(s): APTT, INR in the last 168 hours.       Assessment/Plan:  Walker Shadow is POD1 s/p insertion of SCS with paddle lead. She is cleared for discharge to home.   She will follow up with me in 2 weeks in clinic.    Lonell Face, NP Department of Neurosurgery

## 2020-08-08 NOTE — Discharge Instructions (Signed)
Your surgeon has performed a surgery on your spine to implant a spinal cord stimulator.   * Do not take anti-inflammatory medications for 3 days after surgery (naproxen [Aleve], ibuprofen [Advil, Motrin], celecoxib [Celebrex], etc.)  Activity    No bending, lifting, or twisting (BLT). Avoid lifting objects heavier than 10 pounds (gallon milk jug).  Where possible, avoid household activities that involve lifting, bending, pushing, or pulling such as laundry, vacuuming, grocery shopping, and childcare. Try to arrange for help from friends and family for these activities while your back heals.  Increase physical activity slowly as tolerated.  Taking short walks is encouraged, but avoid strenuous exercise. Do not jog, run, bicycle, lift weights, or participate in any other exercises unless specifically allowed by your doctor. Avoid prolonged sitting, including car rides.  Talk to your doctor before resuming sexual activity.  You should not drive until cleared by your doctor.  Until released by your doctor, you should not return to work or school.  You should rest at home and let your body heal.   You may shower two days after your surgery.  After showering, lightly dab your incision dry. Do not take a tub bath or go swimming for 3 weeks, or until approved by your doctor at your follow-up appointment.  If you smoke, we strongly recommend that you quit.  Smoking has been proven to interfere with normal healing in your back and will dramatically reduce the success rate of your surgery. Please contact [facility] (800-QUIT-NOW) and use the resources at www.[facility].com for assistance in stopping smoking.  Surgical Incision   If you have a dressing on your incision, you may remove it three days after your surgery. Keep your incision area clean and dry.  If you have staples or stitches on your incision, you should have a follow up scheduled for removal. If you do not have staples or stitches, you  will have steri-strips (small pieces of surgical tape) or Dermabond glue. The steri-strips/glue should begin to peel away within about a week (it is fine if the steri-strips fall off before then). If the strips are still in place one week after your surgery, you may gently remove them.  Diet            You may return to your usual diet. Be sure to stay hydrated.  When to Contact us  Although your surgery and recovery will likely be uneventful, you may have some residual numbness, aches, and pains in your back and/or legs. This is normal and should improve in the next few weeks.  However, should you experience any of the following, contact us immediately:  New numbness or weakness  Pain that is progressively getting worse, and is not relieved by your pain medications or rest  Bleeding, redness, swelling, pain, or drainage from surgical incision  Chills or flu-like symptoms  Fever greater than 101.0 F (38.3 C)  Problems with bowel or bladder functions  Difficulty breathing or shortness of breath  Warmth, tenderness, or swelling in your calf  Contact Information  During office hours (Monday-Friday 9 am to 5 pm), please call your physician at [phone]  After hours and weekends, please call [phone] and an answering service will put you in touch with either Dr. Lacinda Axon or Dr. Izora Ribas.   For a life-threatening emergency, call 911

## 2020-08-08 NOTE — Plan of Care (Signed)
Pt ready for discharge verbalized she is ready to go home

## 2020-08-09 NOTE — Progress Notes (Signed)
   08/08/20 0800  Clinical Encounter Type  Visited With Patient;Health care provider  Visit Type Initial  Referral From Patient;Nurse;Chaplain  Consult/Referral To [employer] briefly visited with Pt. She told chaplain that her surgery gave her a new lease on life. Pt also encouraged chaplain and spoke very kindly to her. Pt is excited about being able to spend time with her grandchildren. Pt and chaplain wished each other well and chaplain left because she had to respond to a page.

## 2020-08-15 ENCOUNTER — Telehealth: Payer: Self-pay | Admitting: Student in an Organized Health Care Education/Training Program

## 2020-08-15 NOTE — Telephone Encounter (Signed)
Patient wanted to let [name] know she is doing well since surgery, she is just having normal surgery pain but that is getting better.

## 2020-08-23 ENCOUNTER — Ambulatory Visit (INDEPENDENT_AMBULATORY_CARE_PROVIDER_SITE_OTHER): Payer: Medicare HMO | Admitting: Licensed Clinical Social Worker

## 2020-08-23 ENCOUNTER — Other Ambulatory Visit: Payer: Self-pay

## 2020-08-23 DIAGNOSIS — F4325 Adjustment disorder with mixed disturbance of emotions and conduct: Secondary | ICD-10-CM

## 2020-08-23 NOTE — Progress Notes (Signed)
Virtual Visit via Video Note  I connected with Larina Bras on 08/23/20 at  2:30 PM EST by a video enabled telemedicine application and verified that I am speaking with the correct person using two identifiers.  Location: Patient: home Provider: remote office [place], [place])   I discussed the limitations of evaluation and management by telemedicine and the availability of in person appointments. The patient expressed understanding and agreed to proceed.   The patient was advised to call back or seek an in-person evaluation if the symptoms worsen or if the condition fails to improve as anticipated.  I provided 45 minutes of non-face-to-face time during this encounter.   Levina Boyack R Criss Pallone, LCSW    THERAPIST PROGRESS NOTE  Session Time: 2:30-3:15p  Participation Level: Active  Behavioral Response: NAAlertAnxious and Depressed  Type of Therapy: Individual Therapy  Treatment Goals addressed: Anxiety and Coping  Interventions: Solution Focused, Supportive and Other: chronic pain management  Summary: ULAH OLMO is a 64 y.o. female who presents with some escalating symptoms associated with depression/anxiety/adjustment disorder diagnosis. Pt reporting that she is in chronic pain and that is triggering some mood fluctuations and irritability.   Allowed pt to explore and express thoughts and feelings around current life events: relationships with family members (daughter, grandchildren), pending court case, health-related concerns, current living situation (living in brother's house--wants to move into apartment), psychological impact of physical limitations due to chronic pain.   Allowed pt to reflect about what coping skills she is currently using and reviewed importance of self care, positive peer social engagement, and life balance.   Suicidal/Homicidal: No  Therapist Response: Velna Hatchet is reporting an increase in overall mood fluctuations and stress/anxiety due to chronic  pain  Plan: Return again in 3-4 weeks.  Diagnosis: Axis I: Adjustment Disorder with Mixed Emotional Features    Axis II: No diagnosis    Ernest Haber Sita Mangen, LCSW 08/23/2020

## 2020-08-26 HISTORY — PX: CATARACT EXTRACTION: SUR2

## 2020-08-29 ENCOUNTER — Other Ambulatory Visit: Payer: Self-pay | Admitting: Dermatology

## 2020-08-29 DIAGNOSIS — R21 Rash and other nonspecific skin eruption: Secondary | ICD-10-CM

## 2020-09-01 ENCOUNTER — Other Ambulatory Visit: Payer: Self-pay | Admitting: Dermatology

## 2020-09-01 DIAGNOSIS — B356 Tinea cruris: Secondary | ICD-10-CM

## 2020-09-04 NOTE — Telephone Encounter (Signed)
Patient requesting a refill. I am unfamiliar with pulse dosing. Please advise if OK to refill.

## 2020-09-06 NOTE — Telephone Encounter (Signed)
Ok thank you 

## 2020-09-06 NOTE — Telephone Encounter (Signed)
She should not need a refill. She should have gotten 28 - to take 7 at a time every 53 days/2 months as directed. Can you please ask why she is requesting?

## 2020-09-06 NOTE — Telephone Encounter (Signed)
Per Margarette Asal CMA - she previously spoke with the patient regarding refill requests for the Terbinafine, and the pharmacy is sending requests without the patient's knowledge or request.

## 2020-09-13 ENCOUNTER — Ambulatory Visit: Payer: Medicare HMO | Admitting: Dermatology

## 2020-09-18 DIAGNOSIS — R32 Unspecified urinary incontinence: Secondary | ICD-10-CM | POA: Diagnosis not present

## 2020-09-19 ENCOUNTER — Ambulatory Visit: Admission: RE | Admit: 2020-09-19 | Payer: Medicare HMO | Source: Home / Self Care

## 2020-09-19 ENCOUNTER — Encounter: Admission: RE | Payer: Self-pay | Source: Home / Self Care

## 2020-09-19 SURGERY — COLONOSCOPY WITH PROPOFOL
Anesthesia: General

## 2020-09-21 ENCOUNTER — Ambulatory Visit: Payer: Medicare HMO | Admitting: Dermatology

## 2020-09-25 DIAGNOSIS — Z6835 Body mass index (BMI) 35.0-35.9, adult: Secondary | ICD-10-CM | POA: Diagnosis not present

## 2020-09-25 DIAGNOSIS — M25561 Pain in right knee: Secondary | ICD-10-CM | POA: Diagnosis not present

## 2020-10-03 DIAGNOSIS — E118 Type 2 diabetes mellitus with unspecified complications: Secondary | ICD-10-CM | POA: Diagnosis not present

## 2020-10-03 DIAGNOSIS — E0500 Thyrotoxicosis with diffuse goiter without thyrotoxic crisis or storm: Secondary | ICD-10-CM | POA: Diagnosis not present

## 2020-10-03 DIAGNOSIS — Z87440 Personal history of urinary (tract) infections: Secondary | ICD-10-CM | POA: Diagnosis not present

## 2020-10-03 DIAGNOSIS — I700 Atherosclerosis of aorta: Secondary | ICD-10-CM | POA: Diagnosis not present

## 2020-10-03 DIAGNOSIS — M1711 Unilateral primary osteoarthritis, right knee: Secondary | ICD-10-CM | POA: Diagnosis not present

## 2020-10-03 DIAGNOSIS — R350 Frequency of micturition: Secondary | ICD-10-CM | POA: Diagnosis not present

## 2020-10-04 ENCOUNTER — Other Ambulatory Visit: Payer: Self-pay

## 2020-10-04 ENCOUNTER — Ambulatory Visit (INDEPENDENT_AMBULATORY_CARE_PROVIDER_SITE_OTHER): Payer: Self-pay | Admitting: Licensed Clinical Social Worker

## 2020-10-04 DIAGNOSIS — Z5329 Procedure and treatment not carried out because of patient's decision for other reasons: Secondary | ICD-10-CM

## 2020-10-04 NOTE — Progress Notes (Signed)
LCSW counselor tried to connect with patient for scheduled appointment via MyChart video text request x 2 and email request; also tried to connect via phone without success. LCSW counselor left message for patient to call office number to reschedule OPT appointment.  Jeanmarie Plant, MSW, LCSW Outpatient Therapist/Triage Specialist

## 2020-10-10 DIAGNOSIS — R350 Frequency of micturition: Secondary | ICD-10-CM | POA: Diagnosis not present

## 2020-10-10 DIAGNOSIS — E119 Type 2 diabetes mellitus without complications: Secondary | ICD-10-CM | POA: Diagnosis not present

## 2020-10-10 DIAGNOSIS — I10 Essential (primary) hypertension: Secondary | ICD-10-CM | POA: Diagnosis not present

## 2020-10-10 DIAGNOSIS — I700 Atherosclerosis of aorta: Secondary | ICD-10-CM | POA: Diagnosis not present

## 2020-10-10 DIAGNOSIS — F325 Major depressive disorder, single episode, in full remission: Secondary | ICD-10-CM | POA: Diagnosis not present

## 2020-10-10 DIAGNOSIS — Z6835 Body mass index (BMI) 35.0-35.9, adult: Secondary | ICD-10-CM | POA: Diagnosis not present

## 2020-10-13 DIAGNOSIS — Z96652 Presence of left artificial knee joint: Secondary | ICD-10-CM | POA: Diagnosis not present

## 2020-10-13 DIAGNOSIS — M25562 Pain in left knee: Secondary | ICD-10-CM | POA: Diagnosis not present

## 2020-10-13 DIAGNOSIS — Z96641 Presence of right artificial hip joint: Secondary | ICD-10-CM | POA: Diagnosis not present

## 2020-10-13 DIAGNOSIS — M7062 Trochanteric bursitis, left hip: Secondary | ICD-10-CM | POA: Diagnosis not present

## 2020-10-13 DIAGNOSIS — M25551 Pain in right hip: Secondary | ICD-10-CM | POA: Diagnosis not present

## 2020-10-13 DIAGNOSIS — M25552 Pain in left hip: Secondary | ICD-10-CM | POA: Diagnosis not present

## 2020-10-17 ENCOUNTER — Telehealth: Payer: Self-pay | Admitting: Student in an Organized Health Care Education/Training Program

## 2020-10-17 NOTE — Telephone Encounter (Signed)
Patient called stating she is having quite a lot of increased pain. Going across back down into her leg. She has contacted her surgeon, and the rep. She has turned off the SCS and Tillie Rung is supposed to call her tomorrow morning.she has an appointment next Tues. She wanted [name] to be aware of what is going on.

## 2020-10-18 ENCOUNTER — Other Ambulatory Visit
Admission: RE | Admit: 2020-10-18 | Discharge: 2020-10-18 | Disposition: A | Discharge status: Home / Self Care | Payer: Medicare HMO | Source: Ambulatory Visit | Attending: Gastroenterology | Admitting: Gastroenterology

## 2020-10-18 ENCOUNTER — Other Ambulatory Visit: Payer: Self-pay

## 2020-10-18 DIAGNOSIS — Z01812 Encounter for preprocedural laboratory examination: Secondary | ICD-10-CM | POA: Insufficient documentation

## 2020-10-18 DIAGNOSIS — Z20822 Contact with and (suspected) exposure to covid-19: Secondary | ICD-10-CM | POA: Diagnosis not present

## 2020-10-18 LAB — SARS CORONAVIRUS 2 (TAT 6-24 HRS): SARS Coronavirus 2: NEGATIVE

## 2020-10-19 ENCOUNTER — Other Ambulatory Visit: Payer: Self-pay | Admitting: Dermatology

## 2020-10-19 ENCOUNTER — Encounter: Payer: Self-pay | Admitting: *Deleted

## 2020-10-19 DIAGNOSIS — L299 Pruritus, unspecified: Secondary | ICD-10-CM

## 2020-10-20 ENCOUNTER — Ambulatory Visit: Payer: Medicare HMO | Admitting: Certified Registered Nurse Anesthetist

## 2020-10-20 ENCOUNTER — Other Ambulatory Visit: Payer: Self-pay

## 2020-10-20 ENCOUNTER — Ambulatory Visit
Admission: RE | Admit: 2020-10-20 | Discharge: 2020-10-20 | Disposition: A | Discharge status: Home / Self Care | Payer: Medicare HMO | Attending: Gastroenterology | Admitting: Gastroenterology

## 2020-10-20 ENCOUNTER — Encounter
Admission: RE | Disposition: A | Discharge status: Home / Self Care | Payer: Self-pay | Source: Home / Self Care | Attending: Gastroenterology

## 2020-10-20 ENCOUNTER — Encounter: Payer: Self-pay | Admitting: *Deleted

## 2020-10-20 DIAGNOSIS — E669 Obesity, unspecified: Secondary | ICD-10-CM | POA: Insufficient documentation

## 2020-10-20 DIAGNOSIS — Z886 Allergy status to analgesic agent status: Secondary | ICD-10-CM | POA: Diagnosis not present

## 2020-10-20 DIAGNOSIS — G894 Chronic pain syndrome: Secondary | ICD-10-CM | POA: Insufficient documentation

## 2020-10-20 DIAGNOSIS — Z6836 Body mass index (BMI) 36.0-36.9, adult: Secondary | ICD-10-CM | POA: Insufficient documentation

## 2020-10-20 DIAGNOSIS — Z7951 Long term (current) use of inhaled steroids: Secondary | ICD-10-CM | POA: Insufficient documentation

## 2020-10-20 DIAGNOSIS — R1031 Right lower quadrant pain: Secondary | ICD-10-CM | POA: Insufficient documentation

## 2020-10-20 DIAGNOSIS — F418 Other specified anxiety disorders: Secondary | ICD-10-CM | POA: Diagnosis not present

## 2020-10-20 DIAGNOSIS — Z791 Long term (current) use of non-steroidal anti-inflammatories (NSAID): Secondary | ICD-10-CM | POA: Diagnosis not present

## 2020-10-20 DIAGNOSIS — E785 Hyperlipidemia, unspecified: Secondary | ICD-10-CM | POA: Diagnosis not present

## 2020-10-20 DIAGNOSIS — Z881 Allergy status to other antibiotic agents status: Secondary | ICD-10-CM | POA: Insufficient documentation

## 2020-10-20 DIAGNOSIS — K219 Gastro-esophageal reflux disease without esophagitis: Secondary | ICD-10-CM | POA: Diagnosis not present

## 2020-10-20 DIAGNOSIS — Z79899 Other long term (current) drug therapy: Secondary | ICD-10-CM | POA: Diagnosis not present

## 2020-10-20 DIAGNOSIS — J449 Chronic obstructive pulmonary disease, unspecified: Secondary | ICD-10-CM | POA: Diagnosis not present

## 2020-10-20 DIAGNOSIS — I10 Essential (primary) hypertension: Secondary | ICD-10-CM | POA: Insufficient documentation

## 2020-10-20 DIAGNOSIS — K640 First degree hemorrhoids: Secondary | ICD-10-CM | POA: Diagnosis not present

## 2020-10-20 DIAGNOSIS — K589 Irritable bowel syndrome without diarrhea: Secondary | ICD-10-CM | POA: Diagnosis not present

## 2020-10-20 HISTORY — DX: Obesity, unspecified: E66.9

## 2020-10-20 HISTORY — PX: COLONOSCOPY WITH PROPOFOL: SHX5780

## 2020-10-20 HISTORY — DX: Arthropathic psoriasis, unspecified: L40.50

## 2020-10-20 HISTORY — DX: Chronic obstructive pulmonary disease, unspecified: J44.9

## 2020-10-20 HISTORY — DX: Unspecified thoracic, thoracolumbar and lumbosacral intervertebral disc disorder: M51.9

## 2020-10-20 SURGERY — COLONOSCOPY WITH PROPOFOL
Anesthesia: General

## 2020-10-20 MED ORDER — PROPOFOL 500 MG/50ML IV EMUL
INTRAVENOUS | Status: DC | PRN
  Administered 2020-10-20: 160 ug/kg/min via INTRAVENOUS

## 2020-10-20 MED ORDER — SODIUM CHLORIDE 0.9 % IV SOLN: INTRAVENOUS | Status: DC

## 2020-10-20 MED ORDER — PHENYLEPHRINE HCL (PRESSORS) 10 MG/ML IV SOLN
INTRAVENOUS | Status: AC
  Filled 2020-10-20: qty 1

## 2020-10-20 MED ORDER — GLYCOPYRROLATE 0.2 MG/ML IJ SOLN
INTRAMUSCULAR | Status: AC
  Filled 2020-10-20: qty 1

## 2020-10-20 MED ORDER — LIDOCAINE HCL (PF) 2 % IJ SOLN
INTRAMUSCULAR | Status: AC
  Filled 2020-10-20: qty 20

## 2020-10-20 MED ORDER — PROPOFOL 10 MG/ML IV BOLUS
INTRAVENOUS | Status: DC | PRN
  Administered 2020-10-20: 80 mg via INTRAVENOUS
  Administered 2020-10-20: 20 mg via INTRAVENOUS

## 2020-10-20 MED ORDER — PROPOFOL 500 MG/50ML IV EMUL
INTRAVENOUS | Status: AC
  Filled 2020-10-20: qty 250

## 2020-10-20 NOTE — Op Note (Signed)
[facility] Gastroenterology Patient Name: Christine Higgins Procedure Date: 10/20/2020 7:45 AM MRN: [id number] Account #: [id number] Date of Birth: [birth year] Admit Type: Outpatient Age: 65 Room: [facility] ENDO ROOM 3 Gender: Female Note Status: Finalized Procedure:             Colonoscopy Indications:           Abdominal pain in the right lower quadrant Providers:             Andrey Farmer MD, MD Medicines:             Monitored Anesthesia Care Complications:         No immediate complications. Procedure:             Pre-Anesthesia Assessment:                        - Prior to the procedure, a History and Physical was                         performed, and patient medications and allergies were                         reviewed. The patient is competent. The risks and                         benefits of the procedure and the sedation options and                         risks were discussed with the patient. All questions                         were answered and informed consent was obtained.                         Patient identification and proposed procedure were                         verified by the physician, the nurse, the anesthetist                         and the technician in the endoscopy suite. Mental                         Status Examination: alert and oriented. Airway                         Examination: normal oropharyngeal airway and neck                         mobility. Respiratory Examination: clear to                         auscultation. CV Examination: normal. Prophylactic                         Antibiotics: The patient does not require prophylactic                         antibiotics. Prior Anticoagulants: The patient has  taken no previous anticoagulant or antiplatelet                         agents. ASA Grade Assessment: III - A patient with                         severe systemic disease. After reviewing the risks and                          benefits, the patient was deemed in satisfactory                         condition to undergo the procedure. The anesthesia                         plan was to use monitored anesthesia care (MAC).                         Immediately prior to administration of medications,                         the patient was re-assessed for adequacy to receive                         sedatives. The heart rate, respiratory rate, oxygen                         saturations, blood pressure, adequacy of pulmonary                         ventilation, and response to care were monitored                         throughout the procedure. The physical status of the                         patient was re-assessed after the procedure.                        After obtaining informed consent, the colonoscope was                         passed under direct vision. Throughout the procedure,                         the patient's blood pressure, pulse, and oxygen                         saturations were monitored continuously. The                         Colonoscope was introduced through the anus and                         advanced to the the terminal ileum. The colonoscopy                         was performed without difficulty. The patient  tolerated the procedure well. The quality of the bowel                         preparation was good. Findings:      The perianal and digital rectal examinations were normal.      The terminal ileum appeared normal.      Internal hemorrhoids were found during retroflexion. The hemorrhoids       were Grade I (internal hemorrhoids that do not prolapse).      The exam was otherwise without abnormality on direct and retroflexion       views. Impression:            - The examined portion of the ileum was normal.                        - Internal hemorrhoids.                        - The examination was otherwise normal on direct and                          retroflexion views.                        - No specimens collected. Recommendation:        - Discharge patient to home.                        - Resume previous diet.                        - Continue present medications.                        - Repeat colonoscopy in 10 years for screening                         purposes.                        - Return to referring physician as previously                         scheduled. Procedure Code(s):     --- Professional ---                        [phone], Colonoscopy, flexible; diagnostic, including                         collection of specimen(s) by brushing or washing, when                         performed (separate procedure) Diagnosis Code(s):     --- Professional ---                        K64.0, First degree hemorrhoids                        R10.31, Right lower quadrant pain CPT copyright 2019 American Medical Association. All rights reserved. The codes documented in this report are preliminary and upon  coder review may  be revised to meet current compliance requirements. Andrey Farmer MD, MD 10/20/2020 8:10:28 AM Number of Addenda: 0 Note Initiated On: 10/20/2020 7:45 AM Scope Withdrawal Time: 0 hours 7 minutes 35 seconds  Total Procedure Duration: 0 hours 14 minutes 14 seconds  Estimated Blood Loss:  Estimated blood loss: none.      [facility]

## 2020-10-20 NOTE — Transfer of Care (Signed)
Immediate Anesthesia Transfer of Care Note  Patient: Christine Higgins  Procedure(s) Performed: COLONOSCOPY WITH PROPOFOL (N/A )  Patient Location: PACU  Anesthesia Type:General  Level of Consciousness: awake and alert   Airway & Oxygen Therapy: Patient Spontanous Breathing and Patient connected to nasal cannula oxygen  Post-op Assessment: Report given to RN and Post -op Vital signs reviewed and stable  Post vital signs: Reviewed and stable  Last Vitals:  Vitals Value Taken Time  BP 120/73 10/20/20 0808  Temp    Pulse 74 10/20/20 0812  Resp 21 10/20/20 0812  SpO2 99 % 10/20/20 0812  Vitals shown include unvalidated device data.  Last Pain:  Vitals:   10/20/20 0808  TempSrc:   PainSc: 0-No pain         Complications: No complications documented.

## 2020-10-20 NOTE — Interval H&P Note (Signed)
History and Physical Interval Note:  10/20/2020 7:45 AM  Christine Higgins  has presented today for surgery, with the diagnosis of Right Lower Quadrant Abdominal Pain.  The various methods of treatment have been discussed with the patient and family. After consideration of risks, benefits and other options for treatment, the patient has consented to  Procedure(s): COLONOSCOPY WITH PROPOFOL (N/A) as a surgical intervention.  The patient's history has been reviewed, patient examined, no change in status, stable for surgery.  I have reviewed the patient's chart and labs.  Questions were answered to the patient's satisfaction.     Lesly Rubenstein  Ok to proceed with colonoscopy

## 2020-10-20 NOTE — Anesthesia Preprocedure Evaluation (Signed)
Anesthesia Evaluation  Patient identified by MRN, date of birth, ID band Patient awake    Reviewed: Allergy & Precautions, H&P , NPO status , Patient's Chart, lab work & pertinent test results  History of Anesthesia Complications Negative for: history of anesthetic complications  Airway Mallampati: II  TM Distance: >3 FB     Dental  (+) Edentulous Upper   Pulmonary asthma , neg sleep apnea, neg COPD, former smoker,    breath sounds clear to auscultation       Cardiovascular hypertension, (-) angina(-) Past MI and (-) Cardiac Stents (-) dysrhythmias  Rhythm:regular Rate:Normal     Neuro/Psych  Headaches, PSYCHIATRIC DISORDERS Anxiety Depression Lumbar radiculopathy  Neuromuscular disease    GI/Hepatic Neg liver ROS, GERD  Controlled and Medicated,  Endo/Other  diabetesHypothyroidism   Renal/GU      Musculoskeletal  (+) Arthritis , Fibromyalgia -  Abdominal   Peds negative pediatric ROS (+)  Hematology negative hematology ROS (+)   Anesthesia Other Findings Past Medical History: No date: Anxiety No date: Asthma No date: Breast mass     Comment:  LEFT x 3 months per pt and palpated by physician No date: Cervical disc disease No date: Chronic pain syndrome No date: Degenerative disc disease, lumbar No date: Depression No date: Fibromyalgia No date: Fibromyalgia No date: GERD (gastroesophageal reflux disease) No date: Glaucoma No date: Graves disease No date: Hemorrhoids No date: Hyperlipidemia No date: Hypertension No date: Thyroid disease  Past Surgical History: No date: BACK SURGERY     Comment:  sumbar No date: carpel tunn; Right No date: carpel tunnel; Left No date: CESAREAN SECTION No date: COLONOSCOPY 06/26/2017: ESOPHAGOGASTRODUODENOSCOPY (EGD) WITH PROPOFOL; N/A     Comment:  Procedure: ESOPHAGOGASTRODUODENOSCOPY (EGD) WITH               PROPOFOL;  Surgeon: Lollie Sails, MD;  Location:                [facility];  Service: Endoscopy;  Laterality: N/A; 02/05/2017: EXCISION MORTON'S NEUROMA; Left     Comment:  Procedure: EXCISION MORTON'S NEUROMA;  Surgeon: Samara Deist, DPM;  Location: [place];  Service:               Podiatry;  Laterality: Left;  iva with local No date: HAND SURGERY; Right     Comment:  scar tissue removal No date: JOINT REPLACEMENT     Comment:  right hip arthroplasty 08/25/15 08/11/2017: KNEE ARTHROPLASTY; Left     Comment:  Procedure: COMPUTER ASSISTED TOTAL KNEE ARTHROPLASTY;                Surgeon: Dereck Leep, MD;  Location: [facility];                Service: Orthopedics;  Laterality: Left; 07/02/2018: KNEE ARTHROSCOPY; Right     Comment:  Procedure: ARTHROSCOPY KNEE, Medial and Lateral                Chondroplasty;  Surgeon: Dereck Leep, MD;  Location:               [facility];  Service: Orthopedics;  Laterality: Right; No date: NECK SURGERY     Comment:  "disk implant" 06/24/2019: REVERSE SHOULDER ARTHROPLASTY; Left     Comment:  Procedure: REVERSE SHOULDER ARTHROPLASTY;  Surgeon:  Poggi, Marshall Cork, MD;  Location: [facility] OR[facility];  Service:               Orthopedics;  Laterality: Left; No date: SHOULDER SURGERY; Right     Comment:  spur removal No date: TOTAL HIP ARTHROPLASTY; Right     Reproductive/Obstetrics negative OB ROS                             Anesthesia Physical  Anesthesia Plan  ASA: III  Anesthesia Plan: General   Post-op Pain Management:    Induction:   PONV Risk Score and Plan: Propofol infusion  Airway Management Planned: Nasal Cannula  Additional Equipment:   Intra-op Plan:   Post-operative Plan:   Informed Consent: I have reviewed the patients History and Physical, chart, labs and discussed the procedure including the risks, benefits and alternatives for the proposed anesthesia with the patient or authorized representative who has indicated  his/her understanding and acceptance.     Dental Advisory Given  Plan Discussed with: Anesthesiologist, CRNA and Surgeon  Anesthesia Plan Comments:         Anesthesia Quick Evaluation

## 2020-10-20 NOTE — H&P (Signed)
Outpatient short stay form Pre-procedure 10/20/2020 7:41 AM Christine Miyamoto MD, MPH  Primary Physician: Dr. Ouida Sills  Reason for visit:  Abdominal Pain  History of present illness:   65 y/o lady with history of IBS-C with various GI complaints here for colonoscopy. No family history of GI malignancies. No significant abdominal surgeries. No blood thinners.    Current Facility-Administered Medications:  .  0.9 %  sodium chloride infusion, , Intravenous, Continuous, [place], Hilton Cork, MD, Last Rate: 20 mL/hr at 10/20/20 0734, New Bag at 10/20/20 0734  Medications Prior to Admission  Medication Sig Dispense Refill Last Dose  . acetaminophen (TYLENOL) 500 MG tablet Take 2 tablets (1,000 mg total) by mouth every 6 (six) hours. 30 tablet 0 Past Week at Unknown time  . Apremilast (OTEZLA) 30 MG TABS Take 30 mg by mouth in the morning and at bedtime.    10/19/2020 at 2000  . Ascorbic Acid (VITAMIN C) 1000 MG tablet Take 1,000 mg by mouth daily.    Past Week at Unknown time  . atorvastatin (LIPITOR) 10 MG tablet Take 10 mg by mouth daily after supper.    10/19/2020 at 2000  . Beta Carotene (VITAMIN A) 25000 UNIT capsule Take 25,000 Units by mouth 2 (two) times a week.   Past Week at Unknown time  . bisacodyl (DULCOLAX) 5 MG EC tablet Take 1 tablet (5 mg total) by mouth daily as needed for moderate constipation. 30 tablet 0 Past Week at Unknown time  . budesonide-formoterol (SYMBICORT) 160-4.5 MCG/ACT inhaler Inhale 2 puffs into the lungs 2 (two) times daily.   10/20/2020 at 0600  . calcium carbonate (OS-CAL) 600 MG TABS tablet Take 1 tablet by mouth 2 (two) times a week.    Past Week at Unknown time  . celecoxib (CELEBREX) 200 MG capsule Take 200 mg by mouth 2 (two) times daily.   Past Week at 0800  . Cholecalciferol (VITAMIN D3) 25 MCG (1000 UT) CAPS Take 2,000 Units by mouth every evening.    Past Week at Unknown time  . ciclopirox (LOPROX) 0.77 % cream Apply topically 2 (two) times daily.   Past  Week at Unknown time  . clindamycin (CLEOCIN T) 1 % lotion Apply topically 2 (two) times daily.   Past Week at Unknown time  . clobetasol ointment (TEMOVATE) 0.05 % APPLY TO AFFECTED AREA ON BACK, ABDOMEN, AND RIGHT ELBOW AS NEEDED FOR RASH 60 g 0 Past Week at Unknown time  . clonazePAM (KLONOPIN) 1 MG tablet Take 1 tablet (1 mg total) by mouth at bedtime as needed (for sleep/anxiety). 14 tablet 0 Past Week at Unknown time  . clotrimazole-betamethasone (LOTRISONE) cream Apply 1 application topically 2 (two) times daily as needed (irritation).   10/19/2020 at 2000  . Coenzyme Q10 (COQ-10) 200 MG CAPS Take 2 capsules by mouth every other day.    Past Week at Unknown time  . Cyanocobalamin (VITAMIN B-12) 5000 MCG TBDP Take 5,000 mcg by mouth every other day.    Past Week at Unknown time  . cyclobenzaprine (FLEXERIL) 5 MG tablet Take 5 mg by mouth 3 (three) times daily as needed for muscle spasms.   Past Week at Unknown time  . diphenhydrAMINE (BENADRYL) 25 MG tablet Take 25 mg by mouth at bedtime as needed.   Past Week at Unknown time  . docusate sodium (COLACE) 100 MG capsule Take 1 capsule (100 mg total) by mouth 2 (two) times daily. 10 capsule 0 Past Week at Unknown time  .  DULoxetine (CYMBALTA) 30 MG capsule Take 30 mg by mouth in the morning.    10/19/2020 at 0800  . DULoxetine (CYMBALTA) 60 MG capsule Take 60 mg by mouth every evening.    10/19/2020 at 0800  . EPINEPHrine 0.3 mg/0.3 mL IJ SOAJ injection Inject 0.3 mg into the muscle as needed for anaphylaxis.   Past Month at Unknown time  . esomeprazole (NEXIUM) 40 MG capsule Take 40 mg by mouth 2 (two) times daily.    10/20/2020 at 0600  . fexofenadine (ALLEGRA) 180 MG tablet Take 180 mg by mouth daily.   Past Week at Unknown time  . Fluocinolone Acetonide Body 0.01 % OIL APPLY TO SCALP TWICE DAILY AS NEEDED FOR ITCH. 118.28 mL 3 Past Week at Unknown time  . fluticasone (FLONASE) 50 MCG/ACT nasal spray Place 1 spray into both nostrils daily.     10/20/2020 at 0600  . gabapentin (NEURONTIN) 300 MG capsule Take 1 capsule (300 mg total) by mouth 3 (three) times daily. 90 capsule 5 10/19/2020 at 2000  . hydrALAZINE (APRESOLINE) 25 MG tablet Take 25 mg by mouth 3 (three) times daily as needed (blood pressure 160/100 or higher).    10/20/2020 at 0600  . hydrOXYzine (ATARAX/VISTARIL) 10 MG tablet Take up to 3 tablets (30mg  total) by mouth at bedtime as needed for itching.  risk of drowsiness, increased risk of falling, and to take lowest dose possible for the shortest period of time. (Patient taking differently: Take 10 mg by mouth in the morning and at bedtime.) 30 tablet 0 10/19/2020 at Unknown time  . linaclotide (LINZESS) 290 MCG CAPS capsule Take 290 mcg by mouth daily as needed (constipation).    Past Week at Unknown time  . losartan-hydrochlorothiazide (HYZAAR) 100-25 MG tablet Take 1 tablet by mouth daily.   10/20/2020 at 0600  . methimazole (TAPAZOLE) 10 MG tablet Take 10 mg by mouth daily.    10/19/2020 at 0800  . methocarbamol (ROBAXIN) 750 MG tablet Take 1 tablet (750 mg total) by mouth every 6 (six) hours. 80 tablet 0 10/19/2020 at 2000  . Multiple Vitamin (ANTI-OXIDANT PO) Take by mouth every other day.   Past Week at Unknown time  . Multiple Vitamins-Minerals (ALIVE WOMENS ENERGY PO) Take 1 tablet by mouth daily.   Past Week at Unknown time  . Omega-3 Fatty Acids (FISH OIL) 1000 MG CAPS Take 1,000 mg by mouth 2 (two) times a week.   Past Week at Unknown time  . polyethylene glycol (MIRALAX / GLYCOLAX) 17 g packet Take 17 g by mouth daily as needed for mild constipation. 14 each 0 Past Week at Unknown time  . Prenatal MV-Min-Fe Fum-FA-DHA (PRENATAL 1 PO) Take 1 tablet by mouth every other day.    Past Week at Unknown time  . Probiotic Product (PROBIOTIC PO) Take 1 capsule by mouth every other day.    Past Week at Unknown time  . propranolol (INDERAL) 40 MG tablet Take 40 mg by mouth 2 (two) times daily.  11 10/20/2020 at 0600  . Propylene  Glycol 0.6 % SOLN Place 1 drop into both eyes 2 (two) times daily.   Past Week at Unknown time  . senna (SENOKOT) 8.6 MG TABS tablet Take 2 tablets (17.2 mg total) by mouth 2 (two) times daily. 120 tablet 0 Past Week at Unknown time  . traMADol (ULTRAM) 50 MG tablet Take 1 tablet (50 mg total) by mouth every 8 (eight) hours as needed for severe pain. 90 tablet  2 10/19/2020 at 2000  . traZODone (DESYREL) 150 MG tablet Take 75 mg by mouth at bedtime as needed for sleep.    10/19/2020 at 20000  . TURMERIC PO Take 1,000 mg by mouth every other day.    Past Week at Unknown time  . VITAMIN E PO Take 400 Units by mouth every other day.    Past Week at Unknown time  . hydrocortisone 2.5 % cream APPLY TO AFFECTED AREAS UNDER THE BREAST TWICE DAILY AS NEEDED FOR RASH (Patient not taking: No sig reported) 84 g 2 Not Taking at Unknown time  . ketoconazole (NIZORAL) 2 % cream Apply to affected areas under the breast as needed for rash (Patient not taking: Reported on 10/20/2020) 60 g 11 Not Taking at Unknown time  . Menthol, Topical Analgesic, (ICY HOT EX) Apply 1 application topically 3 (three) times daily as needed (for pain.). (Patient not taking: Reported on 07/19/2020)     . ofloxacin (OCUFLOX) 0.3 % ophthalmic solution  (Patient not taking: Reported on 07/19/2020)     . sucralfate (CARAFATE) 1 g tablet Take 1 g by mouth 2 (two) times daily. (Patient not taking: No sig reported)   Not Taking at Unknown time  . terbinafine (LAMISIL) 250 MG tablet Take one tablet daily for 7 days. Repeat at the beginning of months 3, 5, and 7 (Patient not taking: Reported on 07/19/2020) 28 tablet 0   . triamcinolone ointment (KENALOG) 0.1 % Apply 1 application topically 2 (two) times daily. For 2 weeks. Avoid applying to face, groin, and axilla. Use as directed. Risk of skin atrophy with long-term use reviewed. (Patient not taking: Reported on 07/19/2020) 454 g 1      Allergies  Allergen Reactions  . Cephalexin Hives  .  Ibuprofen Nausea And Vomiting    Flu like symptoms  . Potassium Chloride Itching  . Shellfish Allergy Nausea And Vomiting  . Aspirin Nausea And Vomiting     Past Medical History:  Diagnosis Date  . Anxiety   . Asthma   . Breast mass    LEFT x 3 months per pt and palpated by physician  . Cervical disc disease   . Chronic pain syndrome   . COPD (chronic obstructive pulmonary disease) ([place])   . Degenerative disc disease, lumbar    osteoarthritis  . Depression   . Fibromyalgia   . Fibromyalgia   . GERD (gastroesophageal reflux disease)   . Glaucoma   . Graves disease   . Headache    migraines  . Hemorrhoids   . Hyperlipidemia   . Hypertension   . Lumbar disc disease   . Obesity   . Psoriatic arthritis ([place])   . Thyroid disease     Review of systems:  Otherwise negative.    Physical Exam  Gen: Alert, oriented. Appears stated age.  HEENT: PERRLA. Lungs: No respiratory distress CV: RRR Abd: soft, benign, no masses Ext: No edema    Planned procedures: Proceed with colonoscopy. The patient understands the nature of the planned procedure, indications, risks, alternatives and potential complications including but not limited to bleeding, infection, perforation, damage to internal organs and possible oversedation/side effects from anesthesia. The patient agrees and gives consent to proceed.  Please refer to procedure notes for findings, recommendations and patient disposition/instructions.     Christine Miyamoto MD, MPH Gastroenterology 10/20/2020  7:41 AM

## 2020-10-23 ENCOUNTER — Encounter: Payer: Self-pay | Admitting: Gastroenterology

## 2020-10-24 ENCOUNTER — Ambulatory Visit (HOSPITAL_BASED_OUTPATIENT_CLINIC_OR_DEPARTMENT_OTHER): Payer: Medicare HMO | Admitting: Student in an Organized Health Care Education/Training Program

## 2020-10-24 ENCOUNTER — Ambulatory Visit
Admission: RE | Admit: 2020-10-24 | Discharge: 2020-10-24 | Disposition: A | Discharge status: Home / Self Care | Payer: Medicare HMO | Source: Ambulatory Visit | Attending: Student in an Organized Health Care Education/Training Program | Admitting: Student in an Organized Health Care Education/Training Program

## 2020-10-24 ENCOUNTER — Ambulatory Visit (INDEPENDENT_AMBULATORY_CARE_PROVIDER_SITE_OTHER): Payer: Medicare HMO | Admitting: Licensed Clinical Social Worker

## 2020-10-24 ENCOUNTER — Ambulatory Visit
Admission: RE | Admit: 2020-10-24 | Discharge: 2020-10-24 | Disposition: A | Discharge status: Home / Self Care | Payer: Medicare HMO | Attending: Student in an Organized Health Care Education/Training Program | Admitting: Student in an Organized Health Care Education/Training Program

## 2020-10-24 ENCOUNTER — Encounter: Payer: Self-pay | Admitting: Student in an Organized Health Care Education/Training Program

## 2020-10-24 ENCOUNTER — Other Ambulatory Visit: Payer: Self-pay

## 2020-10-24 VITALS — BP 171/92 | HR 73 | Temp 97.0°F | Resp 16 | Ht 71.0 in | Wt 252.0 lb

## 2020-10-24 DIAGNOSIS — G894 Chronic pain syndrome: Secondary | ICD-10-CM

## 2020-10-24 DIAGNOSIS — M5416 Radiculopathy, lumbar region: Secondary | ICD-10-CM

## 2020-10-24 DIAGNOSIS — M545 Low back pain, unspecified: Secondary | ICD-10-CM

## 2020-10-24 DIAGNOSIS — M797 Fibromyalgia: Secondary | ICD-10-CM | POA: Insufficient documentation

## 2020-10-24 DIAGNOSIS — Z9889 Other specified postprocedural states: Secondary | ICD-10-CM

## 2020-10-24 DIAGNOSIS — M546 Pain in thoracic spine: Secondary | ICD-10-CM

## 2020-10-24 DIAGNOSIS — M961 Postlaminectomy syndrome, not elsewhere classified: Secondary | ICD-10-CM

## 2020-10-24 DIAGNOSIS — M5137 Other intervertebral disc degeneration, lumbosacral region: Secondary | ICD-10-CM

## 2020-10-24 DIAGNOSIS — G8929 Other chronic pain: Secondary | ICD-10-CM | POA: Insufficient documentation

## 2020-10-24 DIAGNOSIS — Z299 Encounter for prophylactic measures, unspecified: Secondary | ICD-10-CM | POA: Insufficient documentation

## 2020-10-24 DIAGNOSIS — F4325 Adjustment disorder with mixed disturbance of emotions and conduct: Secondary | ICD-10-CM

## 2020-10-24 MED ORDER — GABAPENTIN 300 MG PO CAPS
300.0000 mg | ORAL_CAPSULE | Freq: Three times a day (TID) | ORAL | 5 refills | Status: DC
Start: 2020-10-24 — End: 2021-04-24

## 2020-10-24 MED ORDER — METHOCARBAMOL 750 MG PO TABS: 750.0000 mg | ORAL_TABLET | Freq: Four times a day (QID) | ORAL | 2 refills | Status: AC | PRN

## 2020-10-24 MED ORDER — TRAMADOL HCL 50 MG PO TABS: 50.0000 mg | ORAL_TABLET | Freq: Three times a day (TID) | ORAL | 2 refills | Status: DC | PRN

## 2020-10-24 NOTE — Progress Notes (Signed)
Virtual Visit via Video Note  I connected with Christine Higgins on 10/24/20 at  9:00 AM EST by a video enabled telemedicine application and verified that I am speaking with the correct person using two identifiers.  Location: Patient: home Provider: [facility]   I discussed the limitations of evaluation and management by telemedicine and the availability of in person appointments. The patient expressed understanding and agreed to proceed.   I discussed the assessment and treatment plan with the patient. The patient was provided an opportunity to ask questions and all were answered. The patient agreed with the plan and demonstrated an understanding of the instructions.   The patient was advised to call back or seek an in-person evaluation if the symptoms worsen or if the condition fails to improve as anticipated.  I provided 60 minutes of non-face-to-face time during this encounter.   [place], LCSW    THERAPIST PROGRESS NOTE  Session Time: 9-10a  Participation Level: Active  Behavioral Response: Neat and Well GroomedAlertAnxious  Type of Therapy: Individual Therapy  Treatment Goals addressed: Anxiety and Coping  Interventions: CBT and DBT  Summary: Christine Higgins is a 65 y.o. female who presents with improving symptoms related to adjustment disorder diagnosis. Pt reports that mood has been stable and that she is managing stress and anxiety well.   Allowed pt to explore and express thoughts and feelings about recent home situation, packing/moving to new home, family relationships, ending of pending court case, trauma from past.   Pt reports that she is able to articulate how much she has grown, personally, and how she feels she is slowly meeting goals she has set for herself. Pt is uncertain about the future (especially where she will be living), but is sure that her faith will pull her through. Pt plans on moving out of her current home by the end of the month.   Continued  recommendations are as follows: self care behaviors, positive social engagements, focusing on overall work/home/life balance, and focusing on positive physical and emotional wellness.    Suicidal/Homicidal: No  Therapist Response: Christine Higgins is continuing to describe current and past experiences that trigger worry and is able to identify anxiety symptoms including overall impact on functioning. Christine Higgins can also identify coping mechanisms and articulate how the coping mechanisms manage anxiety symptoms. Christine Higgins is able to verbalize hopeful and positive statements regarding the future. Christine Higgins is maintaining positive behaviors and refraining from impulsive behaviors that have been damaging in the past. These current examples of Christine Higgins's behavior reflect personal growth and progress. Treatment to continue as indicated.   Plan: Return again in 4 weeks.  Diagnosis: Axis I: Adjustment Disorder with Disturbance of Conduct    Axis II: No diagnosis    [place], LCSW 10/24/2020

## 2020-10-24 NOTE — Progress Notes (Signed)
Nursing Pain Medication Assessment:  ?Safety precautions to be maintained throughout the outpatient stay will include: orient to surroundings, keep bed in low position, maintain call bell within reach at all times, provide assistance with transfer out of bed and ambulation.  ?Medication Inspection Compliance: Christine Higgins did not comply with our request to bring her pills to be counted. She was reminded that bringing the medication bottles, even when empty, is a requirement. ? ?Medication: None brought in. ?Pill/Patch Count: None available to be counted. ?Bottle Appearance: No container available. Did not bring bottle(s) to appointment. ?Filled Date: N/A ?Last Medication intake:  Yesterday ?

## 2020-10-24 NOTE — Progress Notes (Signed)
PROVIDER NOTE: Information contained herein reflects review and annotations entered in association with encounter. Interpretation of such information and data should be left to medically-trained personnel. Information provided to patient can be located elsewhere in the medical record under "Patient Instructions". Document created using STT-dictation technology, any transcriptional errors that may result from process are unintentional.    Patient: Christine Higgins  Service Category: E/M  Provider: [name], MD  DOB: [birth year]  DOS: 10/24/2020  Specialty: Interventional Pain Management  MRN: [id number]  Setting: Ambulatory outpatient  PCP: Christine Ruths, MD  Type: Established Patient    Referring Provider: Kirk Ruths, MD  Location: Office  Delivery: Face-to-face     HPI  [name], a 65 y.o. year old female, is here today because of her Lumbar radiculopathy [M54.16]. [name] primary complain today is Back Pain (lower) Last encounter: My last encounter with her was on 10/17/2020. Pertinent problems: [name] has Failed back surgical syndrome; DDD (degenerative disc disease), lumbosacral; Severe obesity (BMI 35.0-35.9 with comorbidity) ([place]); Primary osteoarthritis of right knee; Primary osteoarthritis of left knee; DM II (diabetes mellitus, type II), controlled ([place]); Chronic pain syndrome; Fibromyalgia; and Lumbar radiculopathy on their pertinent problem list. Pain Assessment: Severity of Chronic pain is reported as a 6 /10. Location: Back Lower/down both legs to toes. Onset: More than a month ago. Quality: Aching,Burning,Sharp,Shooting. Timing: Constant. Modifying factor(s): meds. Vitals:  height is $RemoveB'5\' 11"'qPEuttib$  (1.803 m) and weight is 252 lb (114.3 kg). Her temporal temperature is 97 F (36.1 C) (abnormal). Her blood pressure is 171/92 (abnormal) and her pulse is 73. Her respiration is 16 and oxygen saturation is 100%.   Reason for encounter: worsening of previously known  (established) problem    Patient follows up today for medication management as well as worsening low back pain that started about 3 weeks ago.  Patient's last visit with me was on 07/27/2020.  She had undergone a successful [employer] spinal cord stimulator trial and subsequently underwent a thoracic laminectomy for a paddle implant with Dr. Lacinda Higgins on 08/07/2020.  Of note she had done very well after her spinal cord stimulator implant for approximately 2 months and noted significant improvement in her low back pain, buttock pain, leg pain as well as leg weakness to the point that she was considering going back to work.  She has also reduced her tramadol intake given the pain relief that she was experiencing from stimulation.  Of note, she did see Dr. Rudene Higgins on 218 for left hip pain where she received a left hip greater trochanteric bursa injection.  She was instructed to follow-up 4 to 6 weeks afterwards..  She has a history of right total hip arthroplasty.  Patient presents today for medication management as well as reprogramming of her spinal cord stimulator.  She was instructed to discontinue spinal cord stimulation for the last week and spinal cord stimulator representative is present today to help with programming.  Pharmacotherapy Assessment   Analgesic: Tramadol 50 mg 3 times daily as needed, quantity 90/month; MME equals 15   Monitoring: [place] PMP: PDMP reviewed during this encounter.       Pharmacotherapy: No side-effects or adverse reactions reported. Compliance: No problems identified. Effectiveness: Clinically acceptable.  Christine Patience, RN  10/24/2020 11:23 AM  Sign when Signing Visit Nursing Pain Medication Assessment:  Safety precautions to be maintained throughout the outpatient stay will include: orient to surroundings, keep bed in low position, maintain call bell within reach at  all times, provide assistance with transfer out of bed and ambulation.  Medication Inspection  Compliance: Ms. Rasmus did not comply with our request to bring her pills to be counted. She was reminded that bringing the medication bottles, even when empty, is a requirement.  Medication: None brought in. Pill/Patch Count: None available to be counted. Bottle Appearance: No container available. Did not bring bottle(s) to appointment. Filled Date: N/A Last Medication intake:  Yesterday    UDS:  Summary  Date Value Ref Range Status  09/29/2019 Note  Final    Comment:    ==================================================================== Compliance Drug Analysis, Ur ==================================================================== Test                             Result       Flag       Units Drug Present and Declared for Prescription Verification   7-aminoclonazepam              65           EXPECTED   ng/mg creat    7-aminoclonazepam is an expected metabolite of clonazepam. Source of    clonazepam is a scheduled prescription medication.   Tramadol                       >4310        EXPECTED   ng/mg creat   O-Desmethyltramadol            >4310        EXPECTED   ng/mg creat   N-Desmethyltramadol            3447         EXPECTED   ng/mg creat    Source of tramadol is a prescription medication. O-desmethyltramadol    and N-desmethyltramadol are expected metabolites of tramadol.   Gabapentin                     PRESENT      EXPECTED   Cyclobenzaprine                PRESENT      EXPECTED   Desmethylcyclobenzaprine       PRESENT      EXPECTED    Desmethylcyclobenzaprine is an expected metabolite of    cyclobenzaprine.   Duloxetine                     PRESENT      EXPECTED   Trazodone                      PRESENT      EXPECTED   1,3 chlorophenyl piperazine    PRESENT      EXPECTED    1,3-chlorophenyl piperazine is an expected metabolite of trazodone.   Diphenhydramine                PRESENT      EXPECTED   Propranolol                    PRESENT      EXPECTED Drug Absent but  Declared for Prescription Verification   Oxycodone                      Not Detected UNEXPECTED ng/mg creat   Acetaminophen  Not Detected UNEXPECTED    Acetaminophen, as indicated in the declared medication list, is not    always detected even when used as directed.   Hydroxyzine                    Not Detected UNEXPECTED ==================================================================== Test                      Result    Flag   Units      Ref Range   Creatinine              116              mg/dL      >=20 ==================================================================== Declared Medications:  The flagging and interpretation on this report are based on the  following declared medications.  Unexpected results may arise from  inaccuracies in the declared medications.  **Note: The testing scope of this panel includes these medications:  Clonazepam  Cyclobenzaprine (Flexeril)  Diphenhydramine (Benadryl)  Duloxetine (Cymbalta)  Gabapentin (Neurontin)  Hydroxyzine  Oxycodone (Roxicodone)  Propranolol (Inderal)  Tramadol (Ultram)  Trazodone (Desyrel)  **Note: The testing scope of this panel does not include small to  moderate amounts of these reported medications:  Acetaminophen  **Note: The testing scope of this panel does not include the  following reported medications:  Apixaban  Atorvastatin (Lipitor)  Betamethasone (Lotrisone)  Budesonide (Symbicort)  Calcium  Ciprofloxacin (Cipro)  Clobetasol  Clotrimazole (Lotrisone)  Esomeprazole (Nexium)  Eye Drops  Formoterol (Symbicort)  Hydralazine (Apresoline)  Hydrochlorothiazide (Hyzaar)  Iron  Losartan (Hyzaar)  Menthol  Multivitamin  Probiotic  Sodium Chloride  Sucralfate (Carafate)  Turmeric  Ubiquinone (CoQ10)  Vitamin B12  Vitamin C  Vitamin D3  Vitamin E ==================================================================== For clinical consultation, please call [phone]. ====================================================================      ROS  Constitutional: Denies any fever or chills Gastrointestinal: No reported hemesis, hematochezia, vomiting, or acute GI distress Musculoskeletal: Low back, leg pain Neurological: No reported episodes of acute onset apraxia, aphasia, dysarthria, agnosia, amnesia, paralysis, loss of coordination, or loss of consciousness  Medication Review  Apremilast, CoQ-10, DULoxetine, EPINEPHrine, Fish Oil, Fluocinolone Acetonide Body, Menthol (Topical Analgesic), Multiple Vitamin, Multiple Vitamins-Minerals, Probiotic Product, Propylene Glycol, Turmeric, Vitamin B-12, Vitamin D3, Vitamin E, acetaminophen, atorvastatin, bisacodyl, budesonide-formoterol, calcium carbonate, celecoxib, ciclopirox, clindamycin, clobetasol ointment, clonazePAM, clotrimazole-betamethasone, cyclobenzaprine, diphenhydrAMINE, docusate sodium, esomeprazole, fexofenadine, fluticasone, gabapentin, hydrALAZINE, hydrOXYzine, linaclotide, losartan-hydrochlorothiazide, methimazole, methocarbamol, polyethylene glycol, propranolol, senna, sucralfate, terbinafine, traMADol, traZODone, triamcinolone ointment, vitamin A, and vitamin C  History Review  Allergy: Ms. Khurana is allergic to cephalexin, ibuprofen, potassium chloride, shellfish allergy, and aspirin. Drug: Ms. Seifert  reports previous drug use. Alcohol:  reports no history of alcohol use. Tobacco:  reports that she quit smoking about 28 years ago. She has never used smokeless tobacco. Social: Ms. Ihrig  reports that she quit smoking about 28 years ago. She has never used smokeless tobacco. She reports previous drug use. She reports that she does not drink alcohol. Medical:  has a past medical history of Anxiety, Asthma, Breast mass, Cervical disc disease, Chronic pain syndrome, COPD (chronic obstructive pulmonary disease) (HCC), Degenerative disc disease, lumbar, Depression, Fibromyalgia, Fibromyalgia,  GERD (gastroesophageal reflux disease), Glaucoma, Graves disease, Headache, Hemorrhoids, Hyperlipidemia, Hypertension, Lumbar disc disease, Obesity, Psoriatic arthritis ([place]), and Thyroid disease. Surgical: Ms. Jimmerson  has a past surgical history that includes carpel tunn (Right); Hand surgery (Right); carpel tunnel (Left); Cesarean section; Shoulder surgery (Right); Back surgery; Neck surgery;  Total hip arthroplasty (Right); Excision Morton's neuroma (Left, 02/05/2017); Colonoscopy; Joint replacement; Esophagogastroduodenoscopy (egd) with propofol (N/A, 06/26/2017); Knee Arthroplasty (Left, 08/11/2017); Knee arthroscopy (Right, 07/02/2018); Reverse shoulder arthroplasty (Left, 06/24/2019); Thoracic laminectomy for spinal cord stimulator (N/A, 08/07/2020); and Colonoscopy with propofol (N/A, 10/20/2020). Family: family history includes Cancer in her mother; Heart disease in her father.  Laboratory Chemistry Profile   Renal Lab Results  Component Value Date   BUN 9 04/27/2020   CREATININE 0.85 04/27/2020   BCR 11 (L) 04/27/2020   GFRAA 83 04/27/2020   GFRNONAA 72 04/27/2020     Hepatic Lab Results  Component Value Date   AST 15 04/27/2020   ALT 20 04/27/2020   ALBUMIN 4.7 04/27/2020   ALKPHOS 118 04/27/2020   LIPASE 27 12/14/2016     Electrolytes Lab Results  Component Value Date   NA 137 04/27/2020   K 3.7 04/27/2020   CL 97 04/27/2020   CALCIUM 10.2 04/27/2020   MG 1.9 08/27/2014     Bone No results found for: VD25OH, VD125OH2TOT, UU7253GU4, QI3474QV9, 25OHVITD1, 25OHVITD2, 25OHVITD3, TESTOFREE, TESTOSTERONE   Inflammation (CRP: Acute Phase) (ESR: Chronic Phase) Lab Results  Component Value Date   CRP <0.8 08/01/2017   ESRSEDRATE 11 08/01/2017       Note: Above Lab results reviewed.  Recent Imaging Review  DG C-Arm 1-60 Min CLINICAL DATA:  Thoracic stimulator device placement.  EXAM: THORACIC SPINE 2 VIEWS; DG C-ARM 1-60 MIN  COMPARISON:  None.  FINDINGS: 5  intraoperative spot fluoro films obtained during placement of thoracic spinal stimulator device. Given the small field of view, level localization is suboptimal, but operative site is approximately the T10 level.  IMPRESSION: Intraoperative assessment during thoracic stimulator placement.  Electronically Signed   By: Misty Stanley M.D.   On: 08/07/2020 09:31 DG Thoracic Spine 2 View CLINICAL DATA:  Thoracic stimulator device placement.  EXAM: THORACIC SPINE 2 VIEWS; DG C-ARM 1-60 MIN  COMPARISON:  None.  FINDINGS: 5 intraoperative spot fluoro films obtained during placement of thoracic spinal stimulator device. Given the small field of view, level localization is suboptimal, but operative site is approximately the T10 level.  IMPRESSION: Intraoperative assessment during thoracic stimulator placement.  Electronically Signed   By: Misty Stanley M.D.   On: 08/07/2020 09:31 Note: Reviewed        Physical Exam  General appearance: Well nourished, well developed, and well hydrated. In no apparent acute distress Mental status: Alert, oriented x 3 (person, place, & time)       Respiratory: No evidence of acute respiratory distress Eyes: PERLA Vitals: BP (!) 171/92   Pulse 73   Temp (!) 97 F (36.1 C) (Temporal)   Resp 16   Ht $R'5\' 11"'oM$  (1.803 m)   Wt 252 lb (114.3 kg)   SpO2 100%   BMI 35.15 kg/m  BMI: Estimated body mass index is 35.15 kg/m as calculated from the following:   Height as of this encounter: $RemoveBeforeD'5\' 11"'SNfqiGRbiWQAYj$  (1.803 m).   Weight as of this encounter: 252 lb (114.3 kg). Ideal: Ideal body weight: 70.8 kg (156 lb 1.4 oz) Adjusted ideal body weight: 88.2 kg (194 lb 7.2 oz)  Lumbar Spine Area Exam  Skin & Axial Inspection:Well healed scar from previous spine surgery detected, IPG present no erythema or induration noted Alignment:Symmetrical Functional DGL:OVFI restricted ROM Stability:No instability detected Muscle Tone/Strength:Functionally intact. No  obvious neuro-muscular anomalies detected. Sensory (Neurological):Dermatomal pain pattern    Lower Extremity Exam    Side:Right lower extremity  Side:Left lower extremity  Stability:No instability observed  Stability:No instability observed  Skin & Extremity Inspection:Skin color, temperature, and hair growth are WNL. No peripheral edema or cyanosis. No masses, redness, swelling, asymmetry, or associated skin lesions. No contractures.  Skin & Extremity Inspection:Skin color, temperature, and hair growth are WNL. No peripheral edema or cyanosis. No masses, redness, swelling, asymmetry, or associated skin lesions. No contractures.  Functional YCX:KGYJEHUDJSHF ROM   Functional WYO:VZCH restricted ROMfor hip and knee joints   Muscle Tone/Strength:Functionally intact. No obvious neuro-muscular anomalies detected.  Muscle Tone/Strength:Functionally intact. No obvious neuro-muscular anomalies detected.  Sensory (Neurological):Unimpaired  Sensory (Neurological):Dermatomal pain pattern  DTR: Patellar:deferred today Achilles:deferred today Plantar:deferred today  DTR: Patellar:deferred today Achilles:deferred today Plantar:deferred today  Palpation:No palpable anomalies  Palpation:No palpable anomalies     Assessment   Status Diagnosis  Having a Flare-up Having a Flare-up Having a Flare-up 1. Lumbar radiculopathy   2. History of lumbar laminectomy for spinal cord decompression   3. Back pain at L4-L5 level   4. DDD (degenerative disc disease), lumbosacral   5. Failed back surgical syndrome   6. Need for prophylactic measure   7. Chronic midline thoracic back pain   8. Fibromyalgia   9. Chronic pain syndrome       Plan of Care  [name] has a current medication list which includes the following long-term medication(s): atorvastatin, budesonide-formoterol, calcium carbonate, clonazepam,  duloxetine, duloxetine, fexofenadine, fluticasone, gabapentin, hydralazine, linaclotide, losartan-hydrochlorothiazide, methimazole, propranolol, and trazodone.  Increased low back and left greater than right leg pain.  Status post spinal cord stimulator implant on 08/07/2020.  Patient states that for the first 6 weeks she was noticing some improvement although she was still recovering from surgery to have it placed.  She states that the stimulation that she is feeling is different from the stimulation that she was feeling during her spinal cord stimulator trial.  She states that she has to increase her stimulation to receive pain relief but it also causes her to have muscle spasms which she does not like.  Public librarian is here today to optimize programming, will hopefully adjust amplitude and pulse width settings to make stimulation more comfortable.  I will also obtain thoracic and lumbar x-rays to ensure that the paddle lead has not migrated or changed positions.  Refill tramadol, Robaxin, gabapentin as below.  Paddle lead is in the correct position and patient symptoms are not better in approximately 2 to 3 weeks, recommend she come in for a caudal epidural steroid injection to help alleviate her lumbar radicular pain flare.  Patient endorsed understanding.  Pharmacotherapy (Medications Ordered): Meds ordered this encounter  Medications  . traMADol (ULTRAM) 50 MG tablet    Sig: Take 1 tablet (50 mg total) by mouth every 8 (eight) hours as needed for severe pain.    Dispense:  90 tablet    Refill:  2    Fill one day early if pharmacy is closed on scheduled refill date.  . methocarbamol (ROBAXIN) 750 MG tablet    Sig: Take 1 tablet (750 mg total) by mouth every 6 (six) hours as needed for muscle spasms.    Dispense:  120 tablet    Refill:  2  . gabapentin (NEURONTIN) 300 MG capsule    Sig: Take 1 capsule (300 mg total) by mouth 3 (three) times daily.    Dispense:  90 capsule     Refill:  5   Orders:  Orders Placed This Encounter  Procedures  . Caudal Epidural Injection    Standing Status:   Standing    Number of Occurrences:   1    Standing Expiration Date:   10/24/2021    Scheduling Instructions:     Laterality: Midline     Level(s): Sacrococcygeal canal (Tailbone area)     Sedation: Patient's choice      TIMEFRAME: PRN procedure. (Ms. Sandall will call when needed.)    Order Specific Question:   Where will this procedure be performed?    Answer:   [facility]  . DG Thoracic Spine 2 View    Patient presents with axial pain with possible radicular component.  In addition to any acute findings, please report on:  1. Facet (Zygapophyseal) joint DJD (Hypertrophy, space narrowing, subchondral sclerosis, and/or osteophyte formation) 2. DDD and/or IVDD (Loss of disc height, desiccation or "Black disc disease") 3. Pars defects 4. Spondylolisthesis, spondylosis, and/or spondyloarthropathies (include Degree/Grade of displacement in mm) 5. Vertebral body Fractures, including age (old, new/acute) 57. Modic Type Changes 7. Demineralization 8. Bone pathology 9. Central, Lateral Recess, and/or Foraminal Stenosis (include AP diameter of stenosis in mm) 10. Surgical changes (hardware type, status, and presence of fibrosis) NOTE: Please specify level(s) and laterality.    Standing Status:   Future    Standing Expiration Date:   10/24/2021    Order Specific Question:   Reason for Exam (SYMPTOM  OR DIAGNOSIS REQUIRED)    Answer:   Upper back pain and/or thoracic spine pain.    Order Specific Question:   Preferred imaging location?    Answer:   [facility]    Order Specific Question:   Call Results- Best Contact Number?    Answer:   [phone] (Patient Clinic facility) (Dr. Dossie Arbour)  . DG Lumbar Spine Complete    Patient presents with axial pain with possible radicular component.  In addition to any acute findings, please report on:  1. Facet  (Zygapophyseal) joint DJD (Hypertrophy, space narrowing, subchondral sclerosis, and/or osteophyte formation) 2. DDD and/or IVDD (Loss of disc height, desiccation or "Black disc disease") 3. Pars defects 4. Spondylolisthesis, spondylosis, and/or spondyloarthropathies (include Degree/Grade of displacement in mm) 5. Vertebral body Fractures, including age (old, new/acute) 71. Modic Type Changes 7. Demineralization 8. Bone pathology 9. Central, Lateral Recess, and/or Foraminal Stenosis (include AP diameter of stenosis in mm) 10. Surgical changes (hardware type, status, and presence of fibrosis) NOTE: Please specify level(s) and laterality.    Standing Status:   Future    Standing Expiration Date:   01/24/2021    Order Specific Question:   Reason for Exam (SYMPTOM  OR DIAGNOSIS REQUIRED)    Answer:   Back Pain    Order Specific Question:   Preferred imaging location?    Answer:   [facility]    Order Specific Question:   Call Results- Best Contact Number?    Answer:   [facility] [phone]    Order Specific Question:   Radiology Contrast Protocol - do NOT remove file path    Answer:   [email or web address]   Follow-up plan:   Return in about 3 months (around 02/01/2021) for Medication Management, in person.     Status post right L5-S1 ESI 03/23/2019, 6 out of 8 cc injected, #2 on 04/21/19, 5 cc more concentrated, #3 on 06/14/2019: 6 cc, #4 09/29/19 left L5/S1 [place] , #5 12/08/19, #6 02/23/20. [employer] SCS trial 06/26/20 significant functional and analgesic benefit.  Patient ambulated during clinic which she  has never done before.  Plan for implant with Dr Christine Higgins         Recent Visits Date Type Provider Dept  07/27/20 Office Visit Christine Santa, MD [facility]  Showing recent visits within past 90 days and meeting all other requirements Today's Visits Date Type Provider Dept  10/24/20 Office Visit Christine Santa, MD [facility]   Showing today's visits and meeting all other requirements Future Appointments No visits were found meeting these conditions. Showing future appointments within next 90 days and meeting all other requirements  I discussed the assessment and treatment plan with the patient. The patient was provided an opportunity to ask questions and all were answered. The patient agreed with the plan and demonstrated an understanding of the instructions.  Patient advised to call back or seek an in-person evaluation if the symptoms or condition worsens.  Duration of encounter: 56minutes.  Note by: Christine Santa, MD Date: 10/24/2020; Time: 11:51 AM

## 2020-10-24 NOTE — Anesthesia Postprocedure Evaluation (Signed)
Anesthesia Post Note  Patient: Christine Higgins  Procedure(s) Performed: COLONOSCOPY WITH PROPOFOL (N/A )  Patient location during evaluation: Endoscopy Anesthesia Type: General Level of consciousness: awake and alert and oriented Pain management: pain level controlled Vital Signs Assessment: post-procedure vital signs reviewed and stable Respiratory status: spontaneous breathing Cardiovascular status: blood pressure returned to baseline Anesthetic complications: no   No complications documented.   Last Vitals:  Vitals:   10/20/20 0731 10/20/20 0837  BP: 118/76 (!) 125/51  Pulse: 88   Resp: 16   Temp: 36.4 C   SpO2: 98%     Last Pain:  Vitals:   10/21/20 1121  TempSrc:   PainSc: 0-No pain                 Craig Wisnewski

## 2020-10-24 NOTE — Patient Instructions (Addendum)
Tramadol, gabapentin and robaxin have been escribed to your pharmacy.  You will have x rays completed prior to next appt.  GENERAL RISKS AND COMPLICATIONS  What are the risk, side effects and possible complications? Generally speaking, most procedures are safe.  However, with any procedure there are risks, side effects, and the possibility of complications.  The risks and complications are dependent upon the sites that are lesioned, or the type of nerve block to be performed.  The closer the procedure is to the spine, the more serious the risks are.  Great care is taken when placing the radio frequency needles, block needles or lesioning probes, but sometimes complications can occur. 1. Infection: Any time there is an injection through the skin, there is a risk of infection.  This is why sterile conditions are used for these blocks.  There are four possible types of infection. 1. Localized skin infection. 2. Central Nervous System Infection-This can be in the form of Meningitis, which can be deadly. 3. Epidural Infections-This can be in the form of an epidural abscess, which can cause pressure inside of the spine, causing compression of the spinal cord with subsequent paralysis. This would require an emergency surgery to decompress, and there are no guarantees that the patient would recover from the paralysis. 4. Discitis-This is an infection of the intervertebral discs.  It occurs in about 1% of discography procedures.  It is difficult to treat and it may lead to surgery.        2. Pain: the needles have to go through skin and soft tissues, will cause soreness.       3. Damage to internal structures:  The nerves to be lesioned may be near blood vessels or    other nerves which can be potentially damaged.       4. Bleeding: Bleeding is more common if the patient is taking blood thinners such as  aspirin, Coumadin, Ticiid, Plavix, etc., or if he/she have some genetic predisposition  such as  hemophilia. Bleeding into the spinal canal can cause compression of the spinal  cord with subsequent paralysis.  This would require an emergency surgery to  decompress and there are no guarantees that the patient would recover from the  paralysis.       5. Pneumothorax:  Puncturing of a lung is a possibility, every time a needle is introduced in  the area of the chest or upper back.  Pneumothorax refers to free air around the  collapsed lung(s), inside of the thoracic cavity (chest cavity).  Another two possible  complications related to a similar event would include: Hemothorax and Chylothorax.   These are variations of the Pneumothorax, where instead of air around the collapsed  lung(s), you may have blood or chyle, respectively.       6. Spinal headaches: They may occur with any procedures in the area of the spine.       7. Persistent CSF (Cerebro-Spinal Fluid) leakage: This is a rare problem, but may occur  with prolonged intrathecal or epidural catheters either due to the formation of a fistulous  track or a dural tear.       8. Nerve damage: By working so close to the spinal cord, there is always a possibility of  nerve damage, which could be as serious as a permanent spinal cord injury with  paralysis.       9. Death:  Although rare, severe deadly allergic reactions known as "Anaphylactic  reaction" can occur to  any of the medications used.      10. Worsening of the symptoms:  We can always make thing worse.  What are the chances of something like this happening? Chances of any of this occuring are extremely low.  By statistics, you have more of a chance of getting killed in a motor vehicle accident: while driving to the hospital than any of the above occurring .  Nevertheless, you should be aware that they are possibilities.  In general, it is similar to taking a shower.  Everybody knows that you can slip, hit your head and get killed.  Does that mean that you should not shower again?  Nevertheless  always keep in mind that statistics do not mean anything if you happen to be on the wrong side of them.  Even if a procedure has a 1 (one) in a 1,000,000 (million) chance of going wrong, it you happen to be that one..Also, keep in mind that by statistics, you have more of a chance of having something go wrong when taking medications.  Who should not have this procedure? If you are on a blood thinning medication (e.g. Coumadin, Plavix, see list of "Blood Thinners"), or if you have an active infection going on, you should not have the procedure.  If you are taking any blood thinners, please inform your physician.  How should I prepare for this procedure?  Do not eat or drink anything at least six hours prior to the procedure.  Bring a driver with you .  It cannot be a taxi.  Come accompanied by an adult that can drive you back, and that is strong enough to help you if your legs get weak or numb from the local anesthetic.  Take all of your medicines the morning of the procedure with just enough water to swallow them.  If you have diabetes, make sure that you are scheduled to have your procedure done first thing in the morning, whenever possible.  If you have diabetes, take only half of your insulin dose and notify our nurse that you have done so as soon as you arrive at the clinic.  If you are diabetic, but only take blood sugar pills (oral hypoglycemic), then do not take them on the morning of your procedure.  You may take them after you have had the procedure.  Do not take aspirin or any aspirin-containing medications, at least eleven (11) days prior to the procedure.  They may prolong bleeding.  Wear loose fitting clothing that may be easy to take off and that you would not mind if it got stained with Betadine or blood.  Do not wear any jewelry or perfume  Remove any nail coloring.  It will interfere with some of our monitoring equipment.  NOTE: Remember that this is not meant to be  interpreted as a complete list of all possible complications.  Unforeseen problems may occur.  BLOOD THINNERS The following drugs contain aspirin or other products, which can cause increased bleeding during surgery and should not be taken for 2 weeks prior to and 1 week after surgery.  If you should need take something for relief of minor pain, you may take acetaminophen which is found in Tylenol,m Datril, Anacin-3 and Panadol. It is not blood thinner. The products listed below are.  Do not take any of the products listed below in addition to any listed on your instruction sheet.  A.P.C or A.P.C with Codeine Codeine Phosphate Capsules #3 Ibuprofen Ridaura  ABC  compound Congesprin Imuran rimadil  Advil Cope Indocin Robaxisal  Alka-Seltzer Effervescent Pain Reliever and Antacid Coricidin or Coricidin-D  Indomethacin Rufen  Alka-Seltzer plus Cold Medicine Cosprin Ketoprofen S-A-C Tablets  Anacin Analgesic Tablets or Capsules Coumadin Korlgesic Salflex  Anacin Extra Strength Analgesic tablets or capsules CP-2 Tablets Lanoril Salicylate  Anaprox Cuprimine Capsules Levenox Salocol  Anexsia-D Dalteparin Magan Salsalate  Anodynos Darvon compound Magnesium Salicylate Sine-off  Ansaid Dasin Capsules Magsal Sodium Salicylate  Anturane Depen Capsules Marnal Soma  APF Arthritis pain formula Dewitt's Pills Measurin Stanback  Argesic Dia-Gesic Meclofenamic Sulfinpyrazone  Arthritis Bayer Timed Release Aspirin Diclofenac Meclomen Sulindac  Arthritis pain formula Anacin Dicumarol Medipren Supac  Analgesic (Safety coated) Arthralgen Diffunasal Mefanamic Suprofen  Arthritis Strength Bufferin Dihydrocodeine Mepro Compound Suprol  Arthropan liquid Dopirydamole Methcarbomol with Aspirin Synalgos  ASA tablets/Enseals Disalcid Micrainin Tagament  Ascriptin Doan's Midol Talwin  Ascriptin A/D Dolene Mobidin Tanderil  Ascriptin Extra Strength Dolobid Moblgesic Ticlid  Ascriptin with Codeine Doloprin or Doloprin  with Codeine Momentum Tolectin  Asperbuf Duoprin Mono-gesic Trendar  Aspergum Duradyne Motrin or Motrin IB Triminicin  Aspirin plain, buffered or enteric coated Durasal Myochrisine Trigesic  Aspirin Suppositories Easprin Nalfon Trillsate  Aspirin with Codeine Ecotrin Regular or Extra Strength Naprosyn Uracel  Atromid-S Efficin Naproxen Ursinus  Auranofin Capsules Elmiron Neocylate Vanquish  Axotal Emagrin Norgesic Verin  Azathioprine Empirin or Empirin with Codeine Normiflo Vitamin E  Azolid Emprazil Nuprin Voltaren  Bayer Aspirin plain, buffered or children's or timed BC Tablets or powders Encaprin Orgaran Warfarin Sodium  Buff-a-Comp Enoxaparin Orudis Zorpin  Buff-a-Comp with Codeine Equegesic Os-Cal-Gesic   Buffaprin Excedrin plain, buffered or Extra Strength Oxalid   Bufferin Arthritis Strength Feldene Oxphenbutazone   Bufferin plain or Extra Strength Feldene Capsules Oxycodone with Aspirin   Bufferin with Codeine Fenoprofen Fenoprofen Pabalate or Pabalate-SF   Buffets II Flogesic Panagesic   Buffinol plain or Extra Strength Florinal or Florinal with Codeine Panwarfarin   Buf-Tabs Flurbiprofen Penicillamine   Butalbital Compound Four-way cold tablets Penicillin   Butazolidin Fragmin Pepto-Bismol   Carbenicillin Geminisyn Percodan   Carna Arthritis Reliever Geopen Persantine   Carprofen Gold's salt Persistin   Chloramphenicol Goody's Phenylbutazone   Chloromycetin Haltrain Piroxlcam   Clmetidine heparin Plaquenil   Cllnoril Hyco-pap Ponstel   Clofibrate Hydroxy chloroquine Propoxyphen         Before stopping any of these medications, be sure to consult the physician who ordered them.  Some, such as Coumadin (Warfarin) are ordered to prevent or treat serious conditions such as "deep thrombosis", "pumonary embolisms", and other heart problems.  The amount of time that you may need off of the medication may also vary with the medication and the reason for which you were taking it.   If you are taking any of these medications, please make sure you notify your pain physician before you undergo any procedures.          Moderate Conscious Sedation, Adult Sedation is the use of medicines to promote relaxation and to relieve discomfort and anxiety. Moderate conscious sedation is a type of sedation. Under moderate conscious sedation, you are less alert than normal, but you are still able to respond to instructions, touch, or both. Moderate conscious sedation is used during short medical and dental procedures. It is milder than deep sedation, which is a type of sedation under which you cannot be easily woken up. It is also milder than general anesthesia, which is the use of medicines to make you unconscious. Moderate conscious sedation  allows you to return to your regular activities sooner. Tell a health care provider about:  Any allergies you have.  All medicines you are taking, including vitamins, herbs, eye drops, creams, and over-the-counter medicines.  Any use of steroids. This includes steroids taken by mouth or as a cream.  Any problems you or family members have had with sedatives and anesthetic medicines.  Any blood disorders you have.  Any surgeries you have had.  Any medical conditions you have, such as sleep apnea.  Whether you are pregnant or may be pregnant.  Any use of cigarettes, alcohol, marijuana, or drugs. What are the risks? Generally, this is a safe procedure. However, problems may occur, including:  Getting too much medicine (oversedation).  Nausea.  Allergic reaction to medicines.  Trouble breathing. If this happens, a breathing tube may be used. It will be removed when you are awake and breathing on your own.  Heart trouble.  Lung trouble.  Confusion that gets better with time (emergence delirium). What happens before the procedure? Staying hydrated Follow instructions from your health care provider about hydration, which may  include:  Up to 2 hours before the procedure - you may continue to drink clear liquids, such as water, clear fruit juice, black coffee, and plain tea. Eating and drinking restrictions Follow instructions from your health care provider about eating and drinking, which may include:  8 hours before the procedure - stop eating heavy meals or foods, such as meat, fried foods, or fatty foods.  6 hours before the procedure - stop eating light meals or foods, such as toast or cereal.  6 hours before the procedure - stop drinking milk or drinks that contain milk.  2 hours before the procedure - stop drinking clear liquids. Medicines Ask your health care provider about:  Changing or stopping your regular medicines. This is especially important if you are taking diabetes medicines or blood thinners.  Taking medicines such as aspirin and ibuprofen. These medicines can thin your blood. Do not take these medicines unless your health care provider tells you to take them.  Taking over-the-counter medicines, vitamins, herbs, and supplements. Tests and exams  You will have a physical exam.  You may have blood tests done to show how well: ? Your kidneys and liver work. ? Your blood clots. General instructions  Plan to have a responsible adult take you home from the hospital or clinic.  If you will be going home right after the procedure, plan to have a responsible adult care for you for the time you are told. This is important. What happens during the procedure?  You will be given the sedative. The sedative may be given: ? As a pill that you will swallow. It can also be inserted into the rectum. ? As a spray through the nose. ? As an injection into the muscle. ? As an injection into the vein through an IV.  You may be given oxygen as needed.  Your breathing, heart rate, and blood pressure will be monitored during the procedure.  The medical or dental procedure will be done. The procedure  may vary among health care providers and hospitals.   What happens after the procedure?  Your blood pressure, heart rate, breathing rate, and blood oxygen level will be monitored until you leave the hospital or clinic.  You will get fluids through your IV if needed.  Do not drive or operate machinery until your health care provider says that it is safe. Summary  Sedation is the use of medicines to promote relaxation and to relieve discomfort and anxiety. Moderate conscious sedation is a type of sedation that is used during short medical and dental procedures.  Tell the health care provider about any medical conditions that you have and about all the medicines that you are taking.  You will be given the sedative as a pill, a spray through the nose, an injection into the muscle, or an injection into the vein through an IV. Vital signs are monitored during the sedation.  Moderate conscious sedation allows you to return to your regular activities sooner. This information is not intended to replace advice given to you by your health care provider. Make sure you discuss any questions you have with your health care provider. Document Revised: 12/10/2019 Document Reviewed: 07/08/2019 Elsevier Patient Education  2021 [place]. Epidural Steroid Injection Patient Information  Description: The epidural space surrounds the nerves as they exit the spinal cord.  In some patients, the nerves can be compressed and inflamed by a bulging disc or a tight spinal canal (spinal stenosis).  By injecting steroids into the epidural space, we can bring irritated nerves into direct contact with a potentially helpful medication.  These steroids act directly on the irritated nerves and can reduce swelling and inflammation which often leads to decreased pain.  Epidural steroids may be injected anywhere along the spine and from the neck to the low back depending upon the location of your pain.   After numbing the skin  with local anesthetic (like Novocaine), a small needle is passed into the epidural space slowly.  You may experience a sensation of pressure while this is being done.  The entire block usually last less than 10 minutes.  Conditions which may be treated by epidural steroids:   Low back and leg pain  Neck and arm pain  Spinal stenosis  Post-laminectomy syndrome  Herpes zoster (shingles) pain  Pain from compression fractures  Preparation for the injection:  1. Do not eat any solid food or dairy products within 8 hours of your appointment.  2. You may drink clear liquids up to 3 hours before appointment.  Clear liquids include water, black coffee, juice or soda.  No milk or cream please. 3. You may take your regular medication, including pain medications, with a sip of water before your appointment  Diabetics should hold regular insulin (if taken separately) and take 1/2 normal NPH dos the morning of the procedure.  Carry some sugar containing items with you to your appointment. 4. A driver must accompany you and be prepared to drive you home after your procedure.  5. Bring all your current medications with your. 6. An IV may be inserted and sedation may be given at the discretion of the physician.   7. A blood pressure cuff, EKG and other monitors will often be applied during the procedure.  Some patients may need to have extra oxygen administered for a short period. 8. You will be asked to provide medical information, including your allergies, prior to the procedure.  We must know immediately if you are taking blood thinners (like Coumadin/Warfarin)  Or if you are allergic to IV iodine contrast (dye). We must know if you could possible be pregnant.  Possible side-effects:  Bleeding from needle site  Infection (rare, may require surgery)  Nerve injury (rare)  Numbness & tingling (temporary)  Difficulty urinating (rare, temporary)  Spinal headache ( a headache worse with upright  posture)  Light -headedness (  temporary)  Pain at injection site (several days)  Decreased blood pressure (temporary)  Weakness in arm/leg (temporary)  Pressure sensation in back/neck (temporary)  Call if you experience:  Fever/chills associated with headache or increased back/neck pain.  Headache worsened by an upright position.  New onset weakness or numbness of an extremity below the injection site  Hives or difficulty breathing (go to the emergency room)  Inflammation or drainage at the infection site  Severe back/neck pain  Any new symptoms which are concerning to you  Please note:  Although the local anesthetic injected can often make your back or neck feel good for several hours after the injection, the pain will likely return.  It takes 3-7 days for steroids to work in the epidural space.  You may not notice any pain relief for at least that one week.  If effective, we will often do a series of three injections spaced 3-6 weeks apart to maximally decrease your pain.  After the initial series, we generally will wait several months before considering a repeat injection of the same type.  If you have any questions, please call [phone] [facility]

## 2020-10-26 ENCOUNTER — Other Ambulatory Visit: Payer: Self-pay | Admitting: Dermatology

## 2020-10-26 DIAGNOSIS — L299 Pruritus, unspecified: Secondary | ICD-10-CM

## 2020-10-27 DIAGNOSIS — H0014 Chalazion left upper eyelid: Secondary | ICD-10-CM | POA: Diagnosis not present

## 2020-10-28 ENCOUNTER — Other Ambulatory Visit: Payer: Self-pay | Admitting: Dermatology

## 2020-10-28 DIAGNOSIS — R21 Rash and other nonspecific skin eruption: Secondary | ICD-10-CM

## 2020-10-30 DIAGNOSIS — E0500 Thyrotoxicosis with diffuse goiter without thyrotoxic crisis or storm: Secondary | ICD-10-CM | POA: Diagnosis not present

## 2020-10-30 DIAGNOSIS — E042 Nontoxic multinodular goiter: Secondary | ICD-10-CM | POA: Diagnosis not present

## 2020-11-01 ENCOUNTER — Other Ambulatory Visit: Payer: Self-pay

## 2020-11-01 ENCOUNTER — Ambulatory Visit (INDEPENDENT_AMBULATORY_CARE_PROVIDER_SITE_OTHER): Payer: Medicare HMO | Admitting: Dermatology

## 2020-11-01 ENCOUNTER — Encounter: Payer: Self-pay | Admitting: Dermatology

## 2020-11-01 DIAGNOSIS — L309 Dermatitis, unspecified: Secondary | ICD-10-CM

## 2020-11-01 DIAGNOSIS — R21 Rash and other nonspecific skin eruption: Secondary | ICD-10-CM

## 2020-11-01 DIAGNOSIS — L299 Pruritus, unspecified: Secondary | ICD-10-CM

## 2020-11-01 MED ORDER — HYDROXYZINE HCL 10 MG PO TABS: ORAL_TABLET | ORAL | 1 refills | Status: DC

## 2020-11-01 MED ORDER — CLOBETASOL PROPIONATE 0.05 % EX OINT: TOPICAL_OINTMENT | CUTANEOUS | 1 refills | Status: DC

## 2020-11-01 NOTE — Progress Notes (Signed)
   Follow-Up Visit   Subjective  Christine Higgins is a 65 y.o. female who presents for the following: Rash (Patient here today for rash follow up at abdomen. Patient just got refill of hydroxyzine and advises if she does not have refills and breaks out, she starts itching and rash worsens. Topically she is using TMC 0.1% ointment and HC 2.5%. ).  The following portions of the chart were reviewed this encounter and updated as appropriate:   Tobacco  Allergies  Meds  Problems  Med Hx  Surg Hx  Fam Hx      Review of Systems:  No other skin or systemic complaints except as noted in HPI or Assessment and Plan.  Objective  Well appearing patient in no apparent distress; mood and affect are within normal limits.  A focused examination was performed including abdomen, chest, back. Relevant physical exam findings are noted in the Assessment and Plan.  Objective  Left Abdomen (side) - Upper: Rough hyperpigmented papules coalescing to thin plaques abdomen and back   Assessment & Plan  Dermatitis Left Abdomen (side) - Upper  with significant itch  Has not yet had patch testing due to spinal stimulator placement. Will schedule.   Has been using hydrocortisone and triamcinolone topically and taking hydroxyzine  Start clobetasol 0.05% oint to more severe areas 1-2 times daily for up to 2 weeks at a time. Avoid applying to face, groin, and axilla. Use as directed. Risk of skin atrophy with long-term use reviewed.   Continue hydroxyzine 10mg  1-3 po qhs prn itch. Reviewed risks of dementia, falls. She advises she needs this to sleep and we will work on getting her off of it with better control of rash.  Topical steroids (such as triamcinolone, fluocinolone, fluocinonide, mometasone, clobetasol, halobetasol, betamethasone, hydrocortisone) can cause thinning and lightening of the skin if they are used for too long in the same area. Your physician has selected the right strength medicine for your  problem and area affected on the body. Please use your medication only as directed by your physician to prevent side effects.    Other Related Medications triamcinolone ointment (KENALOG) 0.1 %  Return in about 2 weeks (around 11/15/2020) for patch testing.  Graciella Belton, RMA, am acting as scribe for Forest Gleason, MD .  Documentation: I have reviewed the above documentation for accuracy and completeness, and I agree with the above.  Forest Gleason, MD

## 2020-11-01 NOTE — Patient Instructions (Addendum)
Recommend OTC [place] Rapid Relief Anti-Itch cream (pramoxine + menthol) up to 3 times per day to areas that are itchy.  Start clobetasol 0.05% ointment to more severe areas of rash 1-2 times daily as needed for up to 2 weeks. Avoid applying to face, groin, and axilla. Use as directed. Risk of skin atrophy with long-term use reviewed.   Topical steroids (such as triamcinolone, fluocinolone, fluocinonide, mometasone, clobetasol, halobetasol, betamethasone, hydrocortisone) can cause thinning and lightening of the skin if they are used for too long in the same area. Your physician has selected the right strength medicine for your problem and area affected on the body. Please use your medication only as directed by your physician to prevent side effects.   Continue hydroxyzine 10mg  1-3 at bedtime as needed for itch.

## 2020-11-08 DIAGNOSIS — M797 Fibromyalgia: Secondary | ICD-10-CM | POA: Diagnosis not present

## 2020-11-08 DIAGNOSIS — L409 Psoriasis, unspecified: Secondary | ICD-10-CM | POA: Diagnosis not present

## 2020-11-08 DIAGNOSIS — Z79899 Other long term (current) drug therapy: Secondary | ICD-10-CM | POA: Diagnosis not present

## 2020-11-08 DIAGNOSIS — L4050 Arthropathic psoriasis, unspecified: Secondary | ICD-10-CM | POA: Diagnosis not present

## 2020-11-14 ENCOUNTER — Other Ambulatory Visit: Payer: Self-pay | Admitting: Dermatology

## 2020-11-14 ENCOUNTER — Ambulatory Visit (INDEPENDENT_AMBULATORY_CARE_PROVIDER_SITE_OTHER): Payer: Medicare HMO

## 2020-11-14 ENCOUNTER — Other Ambulatory Visit: Payer: Self-pay

## 2020-11-14 DIAGNOSIS — L309 Dermatitis, unspecified: Secondary | ICD-10-CM

## 2020-11-14 DIAGNOSIS — R32 Unspecified urinary incontinence: Secondary | ICD-10-CM | POA: Diagnosis not present

## 2020-11-14 DIAGNOSIS — B356 Tinea cruris: Secondary | ICD-10-CM

## 2020-11-14 NOTE — Progress Notes (Signed)
T.R.U.E patch tests applied today to upper back. Pt was advised to keep area as dry as possible until removed in 2 days. Pt advised she will need to come back in 2 days for removal and initial reading and then back again on 11/21/20 for a final read with Dr. Laurence Ferrari.

## 2020-11-16 ENCOUNTER — Other Ambulatory Visit: Payer: Self-pay

## 2020-11-16 ENCOUNTER — Ambulatory Visit (INDEPENDENT_AMBULATORY_CARE_PROVIDER_SITE_OTHER): Payer: Medicare HMO

## 2020-11-16 DIAGNOSIS — L309 Dermatitis, unspecified: Secondary | ICD-10-CM | POA: Diagnosis not present

## 2020-11-16 NOTE — Progress Notes (Signed)
Patches removed today and initial read done. No initial reactions seen.   Showed pt in a mirror how to check for delayed reactions over the weekend and to note down anything she sees.

## 2020-11-21 ENCOUNTER — Ambulatory Visit (INDEPENDENT_AMBULATORY_CARE_PROVIDER_SITE_OTHER): Payer: Medicare HMO | Admitting: Dermatology

## 2020-11-21 ENCOUNTER — Other Ambulatory Visit: Payer: Self-pay

## 2020-11-21 ENCOUNTER — Encounter: Payer: Self-pay | Admitting: Dermatology

## 2020-11-21 DIAGNOSIS — E119 Type 2 diabetes mellitus without complications: Secondary | ICD-10-CM | POA: Diagnosis not present

## 2020-11-21 DIAGNOSIS — L2389 Allergic contact dermatitis due to other agents: Secondary | ICD-10-CM

## 2020-11-21 DIAGNOSIS — I10 Essential (primary) hypertension: Secondary | ICD-10-CM | POA: Diagnosis not present

## 2020-11-21 NOTE — Progress Notes (Signed)
   Follow-Up Visit   Subjective  Christine Higgins is a 65 y.o. female who presents for the following: Follow-up (F/u for patch test reading. Pt states that she does have some itching on her upper back.).  The following portions of the chart were reviewed this encounter and updated as appropriate:  Tobacco  Allergies  Meds  Problems  Med Hx  Surg Hx  Fam Hx      Review of Systems: No other skin or systemic complaints except as noted in HPI or Assessment and Plan.   Objective  Well appearing patient in no apparent distress; mood and affect are within normal limits.  A focused examination was performed including back, face. Relevant physical exam findings are noted in the Assessment and Plan.  Objective  Right Upper Back: Scaly pink papules and patches  Assessment & Plan  Allergic contact dermatitis due to other agents Right Upper Back  Chronic condition with duration or expected duration over one year. Condition is bothersome to patient. Currently flared.  True test patch testing showed Reactions to #35 disperse blue 106 and #36 2 Bromo-2-Nitropropane-1, 3-diol.  Given handouts on what to avoid  Can use www.[email or web address] to help find allergen free skin care products  Continue clobetasol 0.05% ointment twice a day as needed to affected areas. Avoid applying to face, groin, and axilla. Use as directed. Risk of skin atrophy with long-term use reviewed.   Topical steroids (such as triamcinolone, fluocinolone, fluocinonide, mometasone, clobetasol, halobetasol, betamethasone, hydrocortisone) can cause thinning and lightening of the skin if they are used for too long in the same area. Your physician has selected the right strength medicine for your problem and area affected on the body. Please use your medication only as directed by your physician to prevent side effects.    Will recheck onychomycosis, intertrigo and allergic contact dermatitis at follow-up.   Return in  about 3 months (around 02/21/2021) for toenails and rash.   I, Harriett Sine, CMA, am acting as scribe for Forest Gleason, MD.  Documentation: I have reviewed the above documentation for accuracy and completeness, and I agree with the above.  Forest Gleason, MD

## 2020-11-21 NOTE — Patient Instructions (Signed)
[email or web address]

## 2020-11-22 DIAGNOSIS — L4050 Arthropathic psoriasis, unspecified: Secondary | ICD-10-CM | POA: Diagnosis not present

## 2020-11-27 ENCOUNTER — Ambulatory Visit (INDEPENDENT_AMBULATORY_CARE_PROVIDER_SITE_OTHER): Payer: Self-pay | Admitting: Licensed Clinical Social Worker

## 2020-11-27 ENCOUNTER — Other Ambulatory Visit: Payer: Self-pay

## 2020-11-27 DIAGNOSIS — Z5329 Procedure and treatment not carried out because of patient's decision for other reasons: Secondary | ICD-10-CM

## 2020-11-27 NOTE — Progress Notes (Signed)
LCSW counselor tried to connect with patient for scheduled appointment via MyChart video text request x 2 and email request; also tried to connect via phone without success. LCSW counselor attempted but was unable to leave a message.  Jeanmarie Plant, MSW, LCSW Outpatient Therapist/Triage Specialist

## 2020-11-28 DIAGNOSIS — E119 Type 2 diabetes mellitus without complications: Secondary | ICD-10-CM | POA: Diagnosis not present

## 2020-11-28 DIAGNOSIS — I10 Essential (primary) hypertension: Secondary | ICD-10-CM | POA: Diagnosis not present

## 2020-11-28 DIAGNOSIS — Z6835 Body mass index (BMI) 35.0-35.9, adult: Secondary | ICD-10-CM | POA: Diagnosis not present

## 2020-11-28 DIAGNOSIS — E7800 Pure hypercholesterolemia, unspecified: Secondary | ICD-10-CM | POA: Diagnosis not present

## 2020-11-28 DIAGNOSIS — Z0000 Encounter for general adult medical examination without abnormal findings: Secondary | ICD-10-CM | POA: Diagnosis not present

## 2020-11-28 DIAGNOSIS — D708 Other neutropenia: Secondary | ICD-10-CM | POA: Diagnosis not present

## 2020-11-28 DIAGNOSIS — F325 Major depressive disorder, single episode, in full remission: Secondary | ICD-10-CM | POA: Diagnosis not present

## 2020-11-28 DIAGNOSIS — I700 Atherosclerosis of aorta: Secondary | ICD-10-CM | POA: Diagnosis not present

## 2020-12-06 DIAGNOSIS — T84023A Instability of internal left knee prosthesis, initial encounter: Secondary | ICD-10-CM | POA: Diagnosis not present

## 2020-12-06 DIAGNOSIS — L4050 Arthropathic psoriasis, unspecified: Secondary | ICD-10-CM | POA: Diagnosis not present

## 2020-12-06 DIAGNOSIS — Z96652 Presence of left artificial knee joint: Secondary | ICD-10-CM | POA: Diagnosis not present

## 2020-12-12 DIAGNOSIS — Z96652 Presence of left artificial knee joint: Secondary | ICD-10-CM | POA: Diagnosis not present

## 2020-12-12 DIAGNOSIS — M5416 Radiculopathy, lumbar region: Secondary | ICD-10-CM | POA: Diagnosis not present

## 2020-12-12 DIAGNOSIS — I10 Essential (primary) hypertension: Secondary | ICD-10-CM | POA: Diagnosis not present

## 2020-12-12 DIAGNOSIS — R21 Rash and other nonspecific skin eruption: Secondary | ICD-10-CM | POA: Diagnosis not present

## 2020-12-13 ENCOUNTER — Ambulatory Visit: Payer: Medicare HMO | Admitting: Student in an Organized Health Care Education/Training Program

## 2020-12-14 ENCOUNTER — Other Ambulatory Visit: Payer: Self-pay | Admitting: Dermatology

## 2020-12-14 DIAGNOSIS — L219 Seborrheic dermatitis, unspecified: Secondary | ICD-10-CM

## 2020-12-18 ENCOUNTER — Other Ambulatory Visit: Payer: Self-pay

## 2020-12-18 ENCOUNTER — Telehealth (INDEPENDENT_AMBULATORY_CARE_PROVIDER_SITE_OTHER): Payer: Self-pay | Admitting: Licensed Clinical Social Worker

## 2020-12-18 DIAGNOSIS — Z5329 Procedure and treatment not carried out because of patient's decision for other reasons: Secondary | ICD-10-CM

## 2020-12-18 NOTE — Progress Notes (Signed)
LCSW counselor tried to connect with patient for scheduled appointment via [place] video text request x 3 and email request x 2; also tried to connect via phone x 5 without success. LCSW counselor left message for patient to call office number to reschedule OPT appointment. LCSW clinician requesting in person appointments in the future due to technology issues in past appointments.  Jeanmarie Plant, MSW, LCSW Outpatient Therapist/Triage Specialist

## 2020-12-19 ENCOUNTER — Other Ambulatory Visit: Payer: Self-pay | Admitting: Dermatology

## 2020-12-19 DIAGNOSIS — R21 Rash and other nonspecific skin eruption: Secondary | ICD-10-CM

## 2020-12-19 DIAGNOSIS — L299 Pruritus, unspecified: Secondary | ICD-10-CM

## 2020-12-20 ENCOUNTER — Other Ambulatory Visit: Payer: Self-pay | Admitting: Dermatology

## 2020-12-20 DIAGNOSIS — R21 Rash and other nonspecific skin eruption: Secondary | ICD-10-CM

## 2020-12-20 DIAGNOSIS — L299 Pruritus, unspecified: Secondary | ICD-10-CM

## 2020-12-23 DIAGNOSIS — M7918 Myalgia, other site: Secondary | ICD-10-CM | POA: Diagnosis not present

## 2020-12-23 DIAGNOSIS — M545 Low back pain, unspecified: Secondary | ICD-10-CM | POA: Diagnosis not present

## 2020-12-26 ENCOUNTER — Telehealth: Payer: Self-pay

## 2020-12-26 ENCOUNTER — Other Ambulatory Visit: Payer: Self-pay

## 2020-12-26 DIAGNOSIS — R21 Rash and other nonspecific skin eruption: Secondary | ICD-10-CM

## 2020-12-26 DIAGNOSIS — L299 Pruritus, unspecified: Secondary | ICD-10-CM

## 2020-12-26 MED ORDER — HYDROXYZINE HCL 10 MG PO TABS: ORAL_TABLET | ORAL | 0 refills | Status: DC

## 2020-12-26 MED ORDER — CLOBETASOL PROPIONATE 0.05 % EX OINT: TOPICAL_OINTMENT | CUTANEOUS | 0 refills | Status: DC

## 2020-12-26 NOTE — Telephone Encounter (Signed)
States she is in a lot of pain. She saw Dr. Marry Guan and he said it was a nerve but she doesn't believe that's what it is. She wants to know if she can take 2 tramadol instead of 1 because 1 doesn't help her. She want to urgent care and got prednisone but that's not helping either.

## 2020-12-26 NOTE — Telephone Encounter (Signed)
Voicemail left with patient letting her know that it is okay to take 2 tramadol instead of 1 for a short time until the pain is better under control.  Dr Holley Raring did want to check to make sure she does have enough medication to last until her next refill?  Ask her to call me back if this is not the case.

## 2020-12-26 NOTE — Progress Notes (Signed)
Dr. Laurence Ferrari refused medications by mistake. Per Dr. Laurence Ferrari OK to refill.

## 2020-12-27 ENCOUNTER — Telehealth: Payer: Self-pay

## 2020-12-27 NOTE — Telephone Encounter (Signed)
She called back and said she had enough pain meds to last until her appt, and will start taking 2  A day.

## 2021-01-02 ENCOUNTER — Other Ambulatory Visit: Payer: Self-pay

## 2021-01-02 ENCOUNTER — Encounter: Payer: Self-pay | Admitting: Student in an Organized Health Care Education/Training Program

## 2021-01-02 ENCOUNTER — Ambulatory Visit
Payer: Medicare HMO | Attending: Student in an Organized Health Care Education/Training Program | Admitting: Student in an Organized Health Care Education/Training Program

## 2021-01-02 DIAGNOSIS — M961 Postlaminectomy syndrome, not elsewhere classified: Secondary | ICD-10-CM | POA: Diagnosis not present

## 2021-01-02 DIAGNOSIS — G894 Chronic pain syndrome: Secondary | ICD-10-CM | POA: Diagnosis not present

## 2021-01-02 DIAGNOSIS — M5137 Other intervertebral disc degeneration, lumbosacral region: Secondary | ICD-10-CM | POA: Insufficient documentation

## 2021-01-02 DIAGNOSIS — M545 Low back pain, unspecified: Secondary | ICD-10-CM | POA: Diagnosis not present

## 2021-01-02 DIAGNOSIS — M5416 Radiculopathy, lumbar region: Secondary | ICD-10-CM | POA: Diagnosis not present

## 2021-01-02 DIAGNOSIS — Z9889 Other specified postprocedural states: Secondary | ICD-10-CM | POA: Diagnosis not present

## 2021-01-02 DIAGNOSIS — G5703 Lesion of sciatic nerve, bilateral lower limbs: Secondary | ICD-10-CM | POA: Diagnosis not present

## 2021-01-02 MED ORDER — TRAMADOL HCL 50 MG PO TABS: 100.0000 mg | ORAL_TABLET | Freq: Two times a day (BID) | ORAL | 1 refills | Status: DC | PRN

## 2021-01-02 NOTE — Progress Notes (Signed)
Patient: Christine Higgins  Service Category: E/M  Provider: [name], MD  DOB: April 05, 1956  DOS: 01/02/2021  Location: Office  MRN: [id number]  Setting: Ambulatory outpatient  Referring Provider: Kirk Ruths, MD  Type: Established Patient  Specialty: Interventional Pain Management  PCP: Kirk Ruths, MD  Location: Home  Delivery: TeleHealth     Virtual Encounter - Pain Management PROVIDER NOTE: Information contained herein reflects review and annotations entered in association with encounter. Interpretation of such information and data should be left to medically-trained personnel. Information provided to patient can be located elsewhere in the medical record under "Patient Instructions". Document created using STT-dictation technology, any transcriptional errors that may result from process are unintentional.    Contact & Pharmacy Preferred: [phone] Home: [phone] (home) Mobile: [phone] (mobile) E-mail: [email or web address]  [name] [place]: [phone]: [phone]  Pre-screening  Ms. SMurtaghoffered "in-person" vs "virtual" encounter. She indicated preferring virtual for this encounter.   Reason COVID-19*  Social distancing based on CDC and AMA recommendations.   I contacted SWalker Shadowon 01/02/2021 via video conference.      I clearly identified myself as BGillis Santa MD. I verified that I was speaking with the correct person using two identifiers (Name: Christine Higgins and date of birth: 7Apr 04, 1957.  Consent I sought verbal advanced consent from SWalker Shadowfor virtual visit interactions. I informed Ms. SShehanof possible security and privacy concerns, risks, and limitations associated with providing "not-in-person" medical evaluation and management services. I also informed Ms. [name] the availability of "in-person" appointments. Finally, I informed her  that there would be a charge for the virtual visit and that she could be  personally, fully or partially, financially responsible for it. Ms. [name] understanding and agreed to proceed.   Historic Elements   Ms. Christine GEERDESis a 65y.o. year old, female patient evaluated today after our last contact on 12/13/2020. [name] has a past medical history of Anxiety, Asthma, Breast mass, Cervical disc disease, Chronic pain syndrome, COPD (chronic obstructive pulmonary disease) ([place], Degenerative disc disease, lumbar, Depression, Fibromyalgia, Fibromyalgia, GERD (gastroesophageal reflux disease), Glaucoma, Graves disease, Headache, Hemorrhoids, Hyperlipidemia, Hypertension, Lumbar disc disease, Obesity, Psoriatic arthritis ([place], and Thyroid disease. She also  has a past surgical history that includes carpel tunn (Right); Hand surgery (Right); carpel tunnel (Left); Cesarean section; Shoulder surgery (Right); Back surgery; Neck surgery; Total hip arthroplasty (Right); Excision Morton's neuroma (Left, 02/05/2017); Colonoscopy; Joint replacement; Esophagogastroduodenoscopy (egd) with propofol (N/A, 06/26/2017); Knee Arthroplasty (Left, 08/11/2017); Knee arthroscopy (Right, 07/02/2018); Reverse shoulder arthroplasty (Left, 06/24/2019); Thoracic laminectomy for spinal cord stimulator (N/A, 08/07/2020); and Colonoscopy with propofol (N/A, 10/20/2020). Ms. SCoglianesehas a current medication list which includes the following prescription(s): acetaminophen, otezla, vitamin c, atorvastatin, vitamin a, bisacodyl, budesonide-formoterol, calcium carbonate, celecoxib, celecoxib, vitamin d3, ciclopirox, clindamycin, clobetasol ointment, clonazepam, clotrimazole-betamethasone, coq-10, vitamin b-12, diphenhydramine, docusate sodium, duloxetine, duloxetine, epinephrine, esomeprazole, fexofenadine, fluocinolone acetonide body, fluticasone, gabapentin, hydralazine, hydroxyzine, linaclotide, losartan-hydrochlorothiazide, menthol  (topical analgesic), methimazole, methocarbamol, multiple vitamin, multiple vitamins-minerals, fish oil, polyethylene glycol, probiotic product, propranolol, propylene glycol, sucralfate, terbinafine, trazodone, triamcinolone ointment, turmeric, vitamin e, cyclobenzaprine, senna, and tramadol. She  reports that she quit smoking about 28 years ago. She has never used smokeless tobacco. She reports previous drug use. She reports that she does not drink alcohol. Ms. SManagois allergic to cephalexin, ibuprofen, potassium chloride, shellfish allergy, and aspirin.  HPI  Today, she is being contacted for worsening of previously known (established) problem   Is having increased right buttock and right leg pain. Has been evaluated by orthopedics, Dr Marry Guan (s/p left knee replacement).  Left knee x-ray unremarkable.  Per patient, he thought this is due to flareup of her lumbar radiculitis and agrees with caudal epidural steroid injection. Is utilizing her spinal cord stimulator but states that she is getting better relief on the left lower back and left buttock than the right. Is having increased sciatica pain as well as buttock pain which could be related to piriformis syndrome.  Discussed therapeutic caudal injection as well as diagnostic piriformis injection for piriformis syndrome. Given increased pain, we discussed increasing her tramadol to 100 mg twice daily as needed, quantity 120/month.  Patient will need repeat urine toxicology screen for annual medication compliance and monitoring. I have encouraged her to continue utilizing her spinal cord stimulator.  Pharmacotherapy Assessment  Analgesic: Tramadol 50 mg 3 times daily as needed, quantity 90/month; MME equals 15   Monitoring: [place] PMP: PDMP reviewed during this encounter.       Pharmacotherapy: No side-effects or adverse reactions reported. Compliance: No problems identified. Effectiveness: Clinically acceptable. Plan: Refer to "POC".  UDS:   Summary  Date Value Ref Range Status  09/29/2019 Note  Final    Comment:    ==================================================================== Compliance Drug Analysis, Ur ==================================================================== Test                             Result       Flag       Units Drug Present and Declared for Prescription Verification   7-aminoclonazepam              65           EXPECTED   ng/mg creat    7-aminoclonazepam is an expected metabolite of clonazepam. Source of    clonazepam is a scheduled prescription medication.   Tramadol                       >4310        EXPECTED   ng/mg creat   O-Desmethyltramadol            >4310        EXPECTED   ng/mg creat   N-Desmethyltramadol            3447         EXPECTED   ng/mg creat    Source of tramadol is a prescription medication. O-desmethyltramadol    and N-desmethyltramadol are expected metabolites of tramadol.   Gabapentin                     PRESENT      EXPECTED   Cyclobenzaprine                PRESENT      EXPECTED   Desmethylcyclobenzaprine       PRESENT      EXPECTED    Desmethylcyclobenzaprine is an expected metabolite of    cyclobenzaprine.   Duloxetine                     PRESENT      EXPECTED   Trazodone                      PRESENT  EXPECTED   1,3 chlorophenyl piperazine    PRESENT      EXPECTED    1,3-chlorophenyl piperazine is an expected metabolite of trazodone.   Diphenhydramine                PRESENT      EXPECTED   Propranolol                    PRESENT      EXPECTED Drug Absent but Declared for Prescription Verification   Oxycodone                      Not Detected UNEXPECTED ng/mg creat   Acetaminophen                  Not Detected UNEXPECTED    Acetaminophen, as indicated in the declared medication list, is not    always detected even when used as directed.   Hydroxyzine                    Not Detected  UNEXPECTED ==================================================================== Test                      Result    Flag   Units      Ref Range   Creatinine              116              mg/dL      >=20 ==================================================================== Declared Medications:  The flagging and interpretation on this report are based on the  following declared medications.  Unexpected results may arise from  inaccuracies in the declared medications.  **Note: The testing scope of this panel includes these medications:  Clonazepam  Cyclobenzaprine (Flexeril)  Diphenhydramine (Benadryl)  Duloxetine (Cymbalta)  Gabapentin (Neurontin)  Hydroxyzine  Oxycodone (Roxicodone)  Propranolol (Inderal)  Tramadol (Ultram)  Trazodone (Desyrel)  **Note: The testing scope of this panel does not include small to  moderate amounts of these reported medications:  Acetaminophen  **Note: The testing scope of this panel does not include the  following reported medications:  Apixaban  Atorvastatin (Lipitor)  Betamethasone (Lotrisone)  Budesonide (Symbicort)  Calcium  Ciprofloxacin (Cipro)  Clobetasol  Clotrimazole (Lotrisone)  Esomeprazole (Nexium)  Eye Drops  Formoterol (Symbicort)  Hydralazine (Apresoline)  Hydrochlorothiazide (Hyzaar)  Iron  Losartan (Hyzaar)  Menthol  Multivitamin  Probiotic  Sodium Chloride  Sucralfate (Carafate)  Turmeric  Ubiquinone (CoQ10)  Vitamin B12  Vitamin C  Vitamin D3  Vitamin E ==================================================================== For clinical consultation, please call [phone]. ====================================================================     Laboratory Chemistry Profile   Renal Lab Results  Component Value Date   BUN 9 04/27/2020   CREATININE 0.85 04/27/2020   BCR 11 (L) 04/27/2020   GFRAA 83 04/27/2020   GFRNONAA 72 04/27/2020     Hepatic Lab Results  Component Value Date   AST 15 04/27/2020    ALT 20 04/27/2020   ALBUMIN 4.7 04/27/2020   ALKPHOS 118 04/27/2020   LIPASE 27 12/14/2016     Electrolytes Lab Results  Component Value Date   NA 137 04/27/2020   K 3.7 04/27/2020   CL 97 04/27/2020   CALCIUM 10.2 04/27/2020   MG 1.9 08/27/2014     Bone No results found for: VD25OH, VD125OH2TOT, PG9842JI3, XY8118AQ7, 25OHVITD1, 25OHVITD2, 25OHVITD3, TESTOFREE, TESTOSTERONE   Inflammation (CRP: Acute Phase) (ESR: Chronic Phase) Lab Results  Component Value Date  CRP <0.8 08/01/2017   ESRSEDRATE 11 08/01/2017       Note: Above Lab results reviewed.  Imaging  DG Lumbar Spine Complete CLINICAL DATA:  Low back pain  EXAM: LUMBAR SPINE - COMPLETE 4+ VIEW  COMPARISON:  04/14/2016.  MRI 04/17/2020  FINDINGS: Normal alignment. Anterior spurring noted in the lower thoracic spine and throughout the lumbar spine. Early endplate sclerosis noted at L3-4. Diffuse degenerative facet disease, most pronounced at L4-5 and L5-S1 with mild facet hypertrophy. Normal alignment. No fracture. SI joints symmetric and unremarkable.  IMPRESSION: Mild degenerative disc disease and facet disease as above. No acute throughout the lumbar spine bony abnormality.  Electronically Signed   By: Rolm Baptise M.D.   On: 10/25/2020 09:47 DG Thoracic Spine 2 View CLINICAL DATA:  Upper back pain  EXAM: THORACIC SPINE 2 VIEWS  COMPARISON:  MRI 04/17/2020  FINDINGS: Spinal stimulator in place in the midthoracic region. There is disc space narrowing with anterior spurring noted in the mid and lower thoracic spine. Normal alignment. No fracture. Early endplate sclerosis noted at T4-5.  IMPRESSION: Mild degenerative disc disease in the mid and lower thoracic spine. No acute bony abnormality.  Electronically Signed   By: Rolm Baptise M.D.   On: 10/25/2020 09:46  Assessment  The primary encounter diagnosis was Lumbar radiculopathy. Diagnoses of Piriformis syndrome of both sides, History  of lumbar laminectomy for spinal cord decompression, Back pain at L4-L5 level, Failed back surgical syndrome, Chronic pain syndrome, and DDD (degenerative disc disease), lumbosacral were also pertinent to this visit.  Plan of Care  [name] has a current medication list which includes the following long-term medication(s): atorvastatin, budesonide-formoterol, calcium carbonate, clonazepam, duloxetine, duloxetine, fexofenadine, fluticasone, gabapentin, hydralazine, linaclotide, losartan-hydrochlorothiazide, methimazole, propranolol, and trazodone.  Pharmacotherapy (Medications Ordered): Meds ordered this encounter  Medications  . traMADol (ULTRAM) 50 MG tablet    Sig: Take 2 tablets (100 mg total) by mouth every 12 (twelve) hours as needed for severe pain.    Dispense:  120 tablet    Refill:  1    Fill one day early if pharmacy is closed on scheduled refill date.   Orders:  Orders Placed This Encounter  Procedures  . Caudal Epidural Injection    Standing Status:   Future    Standing Expiration Date:   02/02/2021    Scheduling Instructions:     Laterality: Midline     Level(s): Sacrococcygeal canal (Tailbone area)     Sedation: Patient's choice     Scheduling Timeframe: As soon as pre-approved    Order Specific Question:   Where will this procedure be performed?    Answer:   [facility]  . TRIGGER POINT INJECTION    Area: Buttocks region (gluteal area) Indications: Piriformis muscle pain;  Bilateral  piriformis-syndrome; piriformis muscle spasms (W54.627). CPT code: 20552    Scheduling Instructions:     Type: Myoneural block (TPI) of piriformis muscle.     Side:  Bilateral     Sedation: Patient's choice.     Timeframe: Today    Order Specific Question:   Where will this procedure be performed?    Answer:   [facility]   Follow-up plan:   Return in about 1 week (around 01/09/2021) for Caudal + B/L Piriformis Injection without sedation.     Status post  right L5-S1 ESI 03/23/2019, 6 out of 8 cc injected, #2 on 04/21/19, 5 cc more concentrated, #3 on 06/14/2019: 6 cc, #4  09/29/19 left L5/S1 IL , #5 12/08/19, #6 02/23/20. [employer] SCS trial 06/26/20 significant functional and analgesic benefit.  Patient ambulated during clinic which she has never done before.  Plan for implant with Dr Lacinda Axon          Recent Visits Date Type Provider Dept  10/24/20 Office Visit [name], MD [facility]  Showing recent visits within past 90 days and meeting all other requirements Today's Visits Date Type Provider Dept  01/02/21 Telemedicine [name], MD [facility]  Showing today's visits and meeting all other requirements Future Appointments Date Type Provider Dept  01/30/21 Appointment [name], MD [facility]  Showing future appointments within next 90 days and meeting all other requirements  I discussed the assessment and treatment plan with the patient. The patient was provided an opportunity to ask questions and all were answered. The patient agreed with the plan and demonstrated an understanding of the instructions.  Patient advised to call back or seek an in-person evaluation if the symptoms or condition worsens.  Duration of encounter: 40 minutes.  Note by: [name], MD Date: 01/02/2021; Time: 2:01 PM

## 2021-01-03 DIAGNOSIS — E0500 Thyrotoxicosis with diffuse goiter without thyrotoxic crisis or storm: Secondary | ICD-10-CM | POA: Diagnosis not present

## 2021-01-03 DIAGNOSIS — L4050 Arthropathic psoriasis, unspecified: Secondary | ICD-10-CM | POA: Diagnosis not present

## 2021-01-09 ENCOUNTER — Telehealth: Payer: Self-pay | Admitting: Student in an Organized Health Care Education/Training Program

## 2021-01-09 DIAGNOSIS — M545 Low back pain, unspecified: Secondary | ICD-10-CM

## 2021-01-09 DIAGNOSIS — G894 Chronic pain syndrome: Secondary | ICD-10-CM

## 2021-01-09 DIAGNOSIS — M5416 Radiculopathy, lumbar region: Secondary | ICD-10-CM

## 2021-01-09 DIAGNOSIS — M961 Postlaminectomy syndrome, not elsewhere classified: Secondary | ICD-10-CM

## 2021-01-09 DIAGNOSIS — M5137 Other intervertebral disc degeneration, lumbosacral region: Secondary | ICD-10-CM

## 2021-01-09 DIAGNOSIS — Z9889 Other specified postprocedural states: Secondary | ICD-10-CM

## 2021-01-09 MED ORDER — TRAMADOL HCL 50 MG PO TABS: 100.0000 mg | ORAL_TABLET | Freq: Two times a day (BID) | ORAL | 1 refills | Status: DC | PRN

## 2021-01-09 NOTE — Telephone Encounter (Signed)
New Rx sent in for today. Cancel 5-24 Rx, keep follow up appointment next week  Requested Prescriptions   Signed Prescriptions Disp Refills  . traMADol (ULTRAM) 50 MG tablet 120 tablet 1    Sig: Take 2 tablets (100 mg total) by mouth every 12 (twelve) hours as needed for severe pain.    Authorizing Provider: [name]

## 2021-01-09 NOTE — Telephone Encounter (Signed)
Patient states that Dr Holley Raring told her to take the Tramadol 5x/day and she is out.  She can not fill her next script until 01-16-2021.  Please advise.

## 2021-01-10 ENCOUNTER — Telehealth: Payer: Medicare HMO | Admitting: Psychiatry

## 2021-01-10 ENCOUNTER — Ambulatory Visit: Payer: Medicare HMO | Admitting: Licensed Clinical Social Worker

## 2021-01-15 ENCOUNTER — Encounter: Payer: Self-pay | Admitting: Student in an Organized Health Care Education/Training Program

## 2021-01-15 ENCOUNTER — Ambulatory Visit
Admission: RE | Admit: 2021-01-15 | Discharge: 2021-01-15 | Disposition: A | Discharge status: Home / Self Care | Payer: Medicare HMO | Source: Ambulatory Visit | Attending: Student in an Organized Health Care Education/Training Program | Admitting: Student in an Organized Health Care Education/Training Program

## 2021-01-15 ENCOUNTER — Other Ambulatory Visit: Payer: Self-pay

## 2021-01-15 ENCOUNTER — Ambulatory Visit (HOSPITAL_BASED_OUTPATIENT_CLINIC_OR_DEPARTMENT_OTHER): Payer: Medicare HMO | Admitting: Student in an Organized Health Care Education/Training Program

## 2021-01-15 VITALS — BP 142/80 | HR 70 | Temp 97.2°F | Resp 16 | Ht 71.0 in | Wt 237.0 lb

## 2021-01-15 DIAGNOSIS — Z9889 Other specified postprocedural states: Secondary | ICD-10-CM | POA: Diagnosis not present

## 2021-01-15 DIAGNOSIS — M5416 Radiculopathy, lumbar region: Secondary | ICD-10-CM | POA: Diagnosis not present

## 2021-01-15 DIAGNOSIS — M961 Postlaminectomy syndrome, not elsewhere classified: Secondary | ICD-10-CM

## 2021-01-15 MED ORDER — IOHEXOL 180 MG/ML  SOLN
10.0000 mL | Freq: Once | INTRAMUSCULAR | Status: AC
  Administered 2021-01-15: 10 mL via EPIDURAL

## 2021-01-15 MED ORDER — LIDOCAINE HCL 2 % IJ SOLN
INTRAMUSCULAR | Status: AC
  Filled 2021-01-15: qty 20

## 2021-01-15 MED ORDER — LIDOCAINE HCL 2 % IJ SOLN
20.0000 mL | Freq: Once | INTRAMUSCULAR | Status: AC
  Administered 2021-01-15: 400 mg

## 2021-01-15 MED ORDER — ROPIVACAINE HCL 2 MG/ML IJ SOLN
2.0000 mL | Freq: Once | INTRAMUSCULAR | Status: AC
  Administered 2021-01-15: 2 mL via EPIDURAL

## 2021-01-15 MED ORDER — DEXAMETHASONE SODIUM PHOSPHATE 10 MG/ML IJ SOLN
INTRAMUSCULAR | Status: AC
  Filled 2021-01-15: qty 1

## 2021-01-15 MED ORDER — SODIUM CHLORIDE (PF) 0.9 % IJ SOLN
INTRAMUSCULAR | Status: AC
  Filled 2021-01-15: qty 10

## 2021-01-15 MED ORDER — SODIUM CHLORIDE 0.9% FLUSH
2.0000 mL | Freq: Once | INTRAVENOUS | Status: AC
  Administered 2021-01-15: 2 mL

## 2021-01-15 MED ORDER — ROPIVACAINE HCL 2 MG/ML IJ SOLN
INTRAMUSCULAR | Status: AC
  Filled 2021-01-15: qty 10

## 2021-01-15 MED ORDER — DEXAMETHASONE SODIUM PHOSPHATE 10 MG/ML IJ SOLN
10.0000 mg | Freq: Once | INTRAMUSCULAR | Status: AC
  Administered 2021-01-15: 10 mg

## 2021-01-15 NOTE — Progress Notes (Signed)
Safety precautions to be maintained throughout the outpatient stay will include: orient to surroundings, keep bed in low position, maintain call bell within reach at all times, provide assistance with transfer out of bed and ambulation.  

## 2021-01-15 NOTE — Progress Notes (Signed)
PROVIDER NOTE: Information contained herein reflects review and annotations entered in association with encounter. Interpretation of such information and data should be left to medically-trained personnel. Information provided to patient can be located elsewhere in the medical record under "Patient Instructions". Document created using STT-dictation technology, any transcriptional errors that may result from process are unintentional.    Patient: Christine Higgins  Service Category: Procedure  Provider: [name], MD  DOB: 1956-03-18  DOS: 01/15/2021  Location: [facility]  MRN: [id number]  Setting: Ambulatory - outpatient  Referring Provider: Kirk Ruths, MD  Type: Established Patient  Specialty: Interventional Pain Management  PCP: Kirk Ruths, MD   Primary Reason for Visit: Interventional Pain Management Treatment. CC: Back Pain (lower)  Procedure:          Anesthesia, Analgesia, Anxiolysis:  Type: Palliative Epidural Steroid Injection          Region: Caudal Level: Sacrococcygeal   Laterality: Midline       Type: Local Anesthesia  Local Anesthetic: Lidocaine 1-2%  Position: Prone   Indications: 1. Lumbar radiculopathy   2. History of lumbar laminectomy for spinal cord decompression   3. Failed back surgical syndrome    Pain Score: Pre-procedure: 9 /10 Post-procedure: 6  (standing and moving)/10   Pre-op H&P Assessment:  Christine Higgins is a 65 y.o. (year old), female patient, seen today for interventional treatment. She  has a past surgical history that includes carpel tunn (Right); Hand surgery (Right); carpel tunnel (Left); Cesarean section; Shoulder surgery (Right); Back surgery; Neck surgery; Total hip arthroplasty (Right); Excision Morton's neuroma (Left, 02/05/2017); Colonoscopy; Joint replacement; Esophagogastroduodenoscopy (egd) with propofol (N/A, 06/26/2017); Knee Arthroplasty (Left, 08/11/2017); Knee arthroscopy (Right, 07/02/2018); Reverse  shoulder arthroplasty (Left, 06/24/2019); Thoracic laminectomy for spinal cord stimulator (N/A, 08/07/2020); and Colonoscopy with propofol (N/A, 10/20/2020). Christine Higgins has a current medication list which includes the following prescription(s): vitamin c, atorvastatin, vitamin a, budesonide-formoterol, calcium carbonate, celecoxib, celecoxib, vitamin d3, ciclopirox, clindamycin, clobetasol ointment, clonazepam, clotrimazole-betamethasone, coq-10, vitamin b-12, diphenhydramine, duloxetine, duloxetine, epinephrine, esomeprazole, fexofenadine, fluocinolone acetonide body, fluticasone, gabapentin, hydralazine, hydroxyzine, linaclotide, losartan-hydrochlorothiazide, menthol (topical analgesic), methimazole, methocarbamol, multiple vitamin, multiple vitamins-minerals, fish oil, polyethylene glycol, probiotic product, propranolol, propylene glycol, sucralfate, terbinafine, tramadol, trazodone, triamcinolone ointment, turmeric, vitamin e, acetaminophen, otezla, bisacodyl, cyclobenzaprine, docusate sodium, and senna. Her primarily concern today is the Back Pain (lower)  Initial Vital Signs:  Pulse/HCG Rate: 81  Temp: (!) 97.2 F (36.2 C) Resp: 16 BP: 138/85 SpO2: 100 %  BMI: Estimated body mass index is 33.05 kg/m as calculated from the following:   Height as of this encounter: 5\' 11"  (1.803 m).   Weight as of this encounter: 237 lb (107.5 kg).  Risk Assessment: Allergies: Reviewed. She is allergic to cephalexin, ibuprofen, potassium chloride, shellfish allergy, and aspirin.  Allergy Precautions: None required Coagulopathies: Reviewed. None identified.  Blood-thinner therapy: None at this time Active Infection(s): Reviewed. None identified. Christine Higgins is afebrile  Site Confirmation: Christine Higgins was asked to confirm the procedure and laterality before marking the site Procedure checklist: Completed Consent: Before the procedure and under the influence of no sedative(s), amnesic(s), or anxiolytics, the  patient was informed of the treatment options, risks and possible complications. To fulfill our ethical and legal obligations, as recommended by the American Medical Association's Code of Ethics, I have informed the patient of my clinical impression; the nature and purpose of the treatment or procedure; the risks, benefits, and possible complications of the intervention; the alternatives, including  doing nothing; the risk(s) and benefit(s) of the alternative treatment(s) or procedure(s); and the risk(s) and benefit(s) of doing nothing. The patient was provided information about the general risks and possible complications associated with the procedure. These may include, but are not limited to: failure to achieve desired goals, infection, bleeding, organ or nerve damage, allergic reactions, paralysis, and death. In addition, the patient was informed of those risks and complications associated to Spine-related procedures, such as failure to decrease pain; infection (i.e.: Meningitis, epidural or intraspinal abscess); bleeding (i.e.: epidural hematoma, subarachnoid hemorrhage, or any other type of intraspinal or peri-dural bleeding); organ or nerve damage (i.e.: Any type of peripheral nerve, nerve root, or spinal cord injury) with subsequent damage to sensory, motor, and/or autonomic systems, resulting in permanent pain, numbness, and/or weakness of one or several areas of the body; allergic reactions; (i.e.: anaphylactic reaction); and/or death. Furthermore, the patient was informed of those risks and complications associated with the medications. These include, but are not limited to: allergic reactions (i.e.: anaphylactic or anaphylactoid reaction(s)); adrenal axis suppression; blood sugar elevation that in diabetics may result in ketoacidosis or comma; water retention that in patients with history of congestive heart failure may result in shortness of breath, pulmonary edema, and decompensation with resultant  heart failure; weight gain; swelling or edema; medication-induced neural toxicity; particulate matter embolism and blood vessel occlusion with resultant organ, and/or nervous system infarction; and/or aseptic necrosis of one or more joints. Finally, the patient was informed that Medicine is not an exact science; therefore, there is also the possibility of unforeseen or unpredictable risks and/or possible complications that may result in a catastrophic outcome. The patient indicated having understood very clearly. We have given the patient no guarantees and we have made no promises. Enough time was given to the patient to ask questions, all of which were answered to the patient's satisfaction. Christine Higgins has indicated that she wanted to continue with the procedure. Attestation: I, the ordering provider, attest that I have discussed with the patient the benefits, risks, side-effects, alternatives, likelihood of achieving goals, and potential problems during recovery for the procedure that I have provided informed consent. Date  Time: 01/15/2021 12:51 PM  Pre-Procedure Preparation:  Monitoring: As per clinic protocol. Respiration, ETCO2, SpO2, BP, heart rate and rhythm monitor placed and checked for adequate function Safety Precautions: Patient was assessed for positional comfort and pressure points before starting the procedure. Time-out: I initiated and conducted the "Time-out" before starting the procedure, as per protocol. The patient was asked to participate by confirming the accuracy of the "Time Out" information. Verification of the correct person, site, and procedure were performed and confirmed by me, the nursing staff, and the patient. "Time-out" conducted as per Joint Commission's Universal Protocol (UP.01.01.01). Time: 1325  Description of Procedure:          Target Area: Caudal Epidural Canal. Approach: Midline approach. Area Prepped: Entire Posterior Sacrococcygeal Region DuraPrep (Iodine  Povacrylex [0.7% available iodine] and Isopropyl Alcohol, 74% w/w) Safety Precautions: Aspiration looking for blood return was conducted prior to all injections. At no point did we inject any substances, as a needle was being advanced. No attempts were made at seeking any paresthesias. Safe injection practices and needle disposal techniques used. Medications properly checked for expiration dates. SDV (single dose vial) medications used. Description of the Procedure: Protocol guidelines were followed. The patient was placed in position over the fluoroscopy table. The target area was identified and the area prepped in the usual manner. Skin &  deeper tissues infiltrated with local anesthetic. Appropriate amount of time allowed to pass for local anesthetics to take effect. The procedure needles were then advanced to the target area. Proper needle placement secured. Negative aspiration confirmed. Solution injected in intermittent fashion, asking for systemic symptoms every 0.5cc of injectate. The needles were then removed and the area cleansed, making sure to leave some of the prepping solution back to take advantage of its long term bactericidal properties. Vitals:   01/15/21 1256 01/15/21 1320 01/15/21 1330 01/15/21 1340  BP: 138/85 140/80 (!) 141/83 (!) 142/80  Pulse: 81 74 69 70  Resp: 16 16 16 16   Temp: (!) 97.2 F (36.2 C)     TempSrc: Temporal     SpO2: 100% 99% 99% 98%  Weight: 237 lb (107.5 kg)     Height: 5\' 11"  (1.803 m)       Start Time: 1325 hrs. End Time: 1335 hrs. Materials:  Needle(s) Type: Epidural needle Gauge: 22G Length: 3.5-in Medication(s): Please see orders for medications and dosing details. 6 cc solution made of 3 cc of preservative-free saline, 2 cc of 0.2% ropivacaine, 1 cc of Decadron 10 mg/cc.  Imaging Guidance (Spinal):          Type of Imaging Technique: Fluoroscopy Guidance (Spinal) Indication(s): Assistance in needle guidance and placement for procedures  requiring needle placement in or near specific anatomical locations not easily accessible without such assistance. Exposure Time: Please see nurses notes. Contrast: Before injecting any contrast, we confirmed that the patient did not have an allergy to iodine, shellfish, or radiological contrast. Once satisfactory needle placement was completed at the desired level, radiological contrast was injected. Contrast injected under live fluoroscopy. No contrast complications. See chart for type and volume of contrast used. Fluoroscopic Guidance: I was personally present during the use of fluoroscopy. "Tunnel Vision Technique" used to obtain the best possible view of the target area. Parallax error corrected before commencing the procedure. "Direction-depth-direction" technique used to introduce the needle under continuous pulsed fluoroscopy. Once target was reached, antero-posterior, oblique, and lateral fluoroscopic projection used confirm needle placement in all planes. Images permanently stored in EMR. Interpretation: I personally interpreted the imaging intraoperatively. Adequate needle placement confirmed in multiple planes. Appropriate spread of contrast into desired area was observed. No evidence of afferent or efferent intravascular uptake. No intrathecal or subarachnoid spread observed. Permanent images saved into the patient's record.  Post-operative Assessment:  Post-procedure Vital Signs:  Pulse/HCG Rate: 70 (nsr)  Temp: (!) 97.2 F (36.2 C) Resp: 16 BP: (!) 142/80 SpO2: 98 %  EBL: None  Complications: No immediate post-treatment complications observed by team, or reported by patient.  Note: The patient tolerated the entire procedure well. A repeat set of vitals were taken after the procedure and the patient was kept under observation following institutional policy, for this type of procedure. Post-procedural neurological assessment was performed, showing return to baseline, prior to  discharge. The patient was provided with post-procedure discharge instructions, including a section on how to identify potential problems. Should any problems arise concerning this procedure, the patient was given instructions to immediately contact us, at any time, without hesitation. In any case, we plan to contact the patient by telephone for a follow-up status report regarding this interventional procedure.  Comments:  No additional relevant information.  Plan of Care  Orders:  Orders Placed This Encounter  Procedures  . DG PAIN CLINIC C-ARM 1-60 MIN NO REPORT    Intraoperative interpretation by procedural physician at [facility].    Standing Status:   Standing    Number of Occurrences:   1    Order Specific Question:   Reason for exam:    Answer:   Assistance in needle guidance and placement for procedures requiring needle placement in or near specific anatomical locations not easily accessible without such assistance.   Chronic Opioid Analgesic:  Tramadol 50 mg 3 times daily as needed, quantity 90/month; MME equals 15   Patient's paddle lead in correct place, top of T8.  Medications ordered for procedure: Meds ordered this encounter  Medications  . iohexol (OMNIPAQUE) 180 MG/ML injection 10 mL    Must be Myelogram-compatible. If not available, you may substitute with a water-soluble, non-ionic, hypoallergenic, myelogram-compatible radiological contrast medium.  Marland Kitchen lidocaine (XYLOCAINE) 2 % (with pres) injection 400 mg  . ropivacaine (PF) 2 mg/mL (0.2%) (NAROPIN) injection 2 mL  . sodium chloride flush (NS) 0.9 % injection 2 mL  . dexamethasone (DECADRON) injection 10 mg   Medications administered: We administered iohexol, lidocaine, ropivacaine (PF) 2 mg/mL (0.2%), sodium chloride flush, and dexamethasone.  See the medical record for exact dosing, route, and time of administration.  Follow-up plan:   Return in about 5 weeks (around 02/19/2021) for Post Procedure  Evaluation, virtual.    Recent Visits Date Type Provider Dept  01/02/21 Telemedicine [name], MD [facility]  10/24/20 Office Visit [name], MD [facility]  Showing recent visits within past 90 days and meeting all other requirements Today's Visits Date Type Provider Dept  01/15/21 Procedure visit [name], MD [facility]  Showing today's visits and meeting all other requirements Future Appointments Date Type Provider Dept  01/30/21 Appointment [name], MD [facility]  Showing future appointments within next 90 days and meeting all other requirements  Disposition: Discharge home  Discharge (Date  Time): 01/15/2021; 1342 hrs.   Primary Care Physician: Kirk Ruths, MD Location: [facility] Note by: [name], MD Date: 01/15/2021; Time: 1:45 PM  Disclaimer:  Medicine is not an exact science. The only guarantee in medicine is that nothing is guaranteed. It is important to note that the decision to proceed with this intervention was based on the information collected from the patient. The Data and conclusions were drawn from the patient's questionnaire, the interview, and the physical examination. Because the information was provided in large part by the patient, it cannot be guaranteed that it has not been purposely or unconsciously manipulated. Every effort has been made to obtain as much relevant data as possible for this evaluation. It is important to note that the conclusions that lead to this procedure are derived in large part from the available data. Always take into account that the treatment will also be dependent on availability of resources and existing treatment guidelines, considered by other Pain Management Practitioners as being common knowledge and practice, at the time of the intervention. For Medico-Legal purposes, it is also important to point out that variation in procedural  techniques and pharmacological choices are the acceptable norm. The indications, contraindications, technique, and results of the above procedure should only be interpreted and judged by a Board-Certified Interventional Pain Specialist with extensive familiarity and expertise in the same exact procedure and technique.

## 2021-01-15 NOTE — Patient Instructions (Signed)

## 2021-01-16 ENCOUNTER — Telehealth: Payer: Self-pay | Admitting: *Deleted

## 2021-01-16 ENCOUNTER — Other Ambulatory Visit: Payer: Self-pay | Admitting: Dermatology

## 2021-01-16 DIAGNOSIS — R21 Rash and other nonspecific skin eruption: Secondary | ICD-10-CM

## 2021-01-16 NOTE — Telephone Encounter (Signed)
No problems post procedure. 

## 2021-01-18 ENCOUNTER — Telehealth: Payer: Self-pay | Admitting: Student in an Organized Health Care Education/Training Program

## 2021-01-18 NOTE — Telephone Encounter (Signed)
Called patient back to convey information given to me by Dr Holley Raring.  Patient verbalizes u/o information.

## 2021-01-18 NOTE — Telephone Encounter (Signed)
Patient has called about her increased pain.  States caudal epidural has not helped and she feels that something is wrong.  She states she is not getting the same relief that the trial gave her and she is concerned.  States she has talked with Dr Holley Raring about this.  Patient would like to have an MRI to see what the problem is.

## 2021-01-18 NOTE — Telephone Encounter (Signed)
Attempted to reach patient, left voicemail with patient.

## 2021-01-19 NOTE — Discharge Instructions (Signed)
Instructions after Total Knee Replacement   Christine Higgins, Jr., M.D.     Dept. of Orthopaedics & Sports Medicine  [facility]  [address]  [place], [place]  [address]  Phone: [phone]   Fax: [phone]    DIET: Drink plenty of non-alcoholic fluids. Resume your normal diet. Include foods high in fiber.  ACTIVITY:  You may use crutches or a walker with weight-bearing as tolerated, unless instructed otherwise. You may be weaned off of the walker or crutches by your Physical Therapist.  Do NOT place pillows under the knee. Anything placed under the knee could limit your ability to straighten the knee.   Continue doing gentle exercises. Exercising will reduce the pain and swelling, increase motion, and prevent muscle weakness.   Please continue to use the TED compression stockings for 6 weeks. You may remove the stockings at night, but should reapply them in the morning. Do not drive or operate any equipment until instructed.  WOUND CARE:  Continue to use the PolarCare or ice packs periodically to reduce pain and swelling. You may bathe or shower after the staples are removed at the first office visit following surgery.  MEDICATIONS: You may resume your regular medications. Please take the pain medication as prescribed on the medication. Do not take pain medication on an empty stomach. You have been given a prescription for a blood thinner (Lovenox or Coumadin). Please take the medication as instructed. (NOTE: After completing a 2 week course of Lovenox, take one Enteric-coated aspirin once a day. This along with elevation will help reduce the possibility of phlebitis in your operated leg.) Do not drive or drink alcoholic beverages when taking pain medications.  CALL THE OFFICE FOR: Temperature above 101 degrees Excessive bleeding or drainage on the dressing. Excessive swelling, coldness, or paleness of the toes. Persistent nausea and vomiting.  FOLLOW-UP:  You  should have an appointment to return to the office in 10-14 days after surgery. Arrangements have been made for continuation of Physical Therapy (either home therapy or outpatient therapy).   [facility] Department Directory         [email or web address]       [email or web address]          Cardiology  Appointments: [place] - [phone] [place] - [phone]  Endocrinology  Appointments: [place] - [phone] [place] - [phone]  Gastroenterology  Appointments: [place] - [phone] [place] - [phone]        General Surgery   Appointments: [place] - [phone]  Internal Medicine/Family Medicine  Appointments: [place] - [phone] [place] - [phone] [place] - [phone]  Metabolic and Weigh Loss Surgery  Appointments: [place] - [phone]        Neurology  Appointments: [place] - [phone] [place] - [phone]  Neurosurgery  Appointments: [place] - [phone]  Obstetrics & Gynecology  Appointments: [place] - [phone] [place] - [phone]        Pediatrics  Appointments: [place] - [phone] [place] - [phone]  Physiatry  Appointments: [place] -[phone]  Physical Therapy  Appointments: [place] - [phone] [place] - [phone]        Podiatry  Appointments: [address] - [phone] [place] - [phone]  Pulmonology  Appointments: [place] - [phone]  Rheumatology  Appointments: [place] - [phone]        [place] Location: [facility]  [address] , [place]  [address]  [place] Location: [facility] [address] [place], [place]  [address]  [place] Location: [facility] [address] [place], [place]  [address]    

## 2021-01-22 ENCOUNTER — Other Ambulatory Visit: Payer: Self-pay | Admitting: Student in an Organized Health Care Education/Training Program

## 2021-01-22 DIAGNOSIS — G894 Chronic pain syndrome: Secondary | ICD-10-CM

## 2021-01-22 DIAGNOSIS — M5416 Radiculopathy, lumbar region: Secondary | ICD-10-CM

## 2021-01-22 DIAGNOSIS — M5137 Other intervertebral disc degeneration, lumbosacral region: Secondary | ICD-10-CM

## 2021-01-23 DIAGNOSIS — Z111 Encounter for screening for respiratory tuberculosis: Secondary | ICD-10-CM | POA: Diagnosis not present

## 2021-01-25 ENCOUNTER — Encounter
Admission: RE | Admit: 2021-01-25 | Discharge: 2021-01-25 | Disposition: A | Discharge status: Home / Self Care | Payer: Medicare HMO | Source: Ambulatory Visit | Attending: Orthopedic Surgery | Admitting: Orthopedic Surgery

## 2021-01-25 ENCOUNTER — Other Ambulatory Visit: Payer: Self-pay

## 2021-01-25 DIAGNOSIS — Z01818 Encounter for other preprocedural examination: Secondary | ICD-10-CM | POA: Insufficient documentation

## 2021-01-25 DIAGNOSIS — Z01810 Encounter for preprocedural cardiovascular examination: Secondary | ICD-10-CM | POA: Diagnosis not present

## 2021-01-25 HISTORY — DX: Thyrotoxicosis, unspecified without thyrotoxic crisis or storm: E05.90

## 2021-01-25 LAB — CBC
HCT: 38.3 % (ref 36.0–46.0)
Hemoglobin: 13 g/dL (ref 12.0–15.0)
MCH: 27.6 pg (ref 26.0–34.0)
MCHC: 33.9 g/dL (ref 30.0–36.0)
MCV: 81.3 fL (ref 80.0–100.0)
Platelets: 194 10*3/uL (ref 150–400)
RBC: 4.71 MIL/uL (ref 3.87–5.11)
RDW: 14.6 % (ref 11.5–15.5)
WBC: 3 10*3/uL — ABNORMAL LOW (ref 4.0–10.5)
nRBC: 0 % (ref 0.0–0.2)

## 2021-01-25 LAB — COMPREHENSIVE METABOLIC PANEL
ALT: 20 U/L (ref 0–44)
AST: 21 U/L (ref 15–41)
Albumin: 4.3 g/dL (ref 3.5–5.0)
Alkaline Phosphatase: 81 U/L (ref 38–126)
Anion gap: 9 (ref 5–15)
BUN: 9 mg/dL (ref 8–23)
CO2: 27 mmol/L (ref 22–32)
Calcium: 9.3 mg/dL (ref 8.9–10.3)
Chloride: 101 mmol/L (ref 98–111)
Creatinine, Ser: 0.68 mg/dL (ref 0.44–1.00)
GFR, Estimated: >60 mL/min (ref >60)
Glucose, Bld: 116 mg/dL — ABNORMAL HIGH (ref 70–99)
Potassium: 3.6 mmol/L (ref 3.5–5.1)
Sodium: 137 mmol/L (ref 135–145)
Total Bilirubin: 0.7 mg/dL (ref 0.3–1.2)
Total Protein: 6.9 g/dL (ref 6.5–8.1)

## 2021-01-25 LAB — PROTIME-INR
INR: 0.9 (ref 0.8–1.2)
Prothrombin Time: 12.2 seconds (ref 11.4–15.2)

## 2021-01-25 LAB — TYPE AND SCREEN
ABO/RH(D): B POS
Antibody Screen: NEGATIVE

## 2021-01-25 LAB — URINALYSIS, ROUTINE W REFLEX MICROSCOPIC
Bilirubin Urine: NEGATIVE
Glucose, UA: NEGATIVE mg/dL
Hgb urine dipstick: NEGATIVE
Ketones, ur: NEGATIVE mg/dL
Leukocytes,Ua: NEGATIVE
Nitrite: NEGATIVE
Protein, ur: NEGATIVE mg/dL
Specific Gravity, Urine: 1.011 (ref 1.005–1.030)
pH: 7 (ref 5.0–8.0)

## 2021-01-25 LAB — SEDIMENTATION RATE: Sed Rate: 6 mm/hr (ref 0–30)

## 2021-01-25 LAB — SURGICAL PCR SCREEN
MRSA, PCR: NEGATIVE
Staphylococcus aureus: NEGATIVE

## 2021-01-25 LAB — APTT: aPTT: 31 seconds (ref 24–36)

## 2021-01-25 LAB — C-REACTIVE PROTEIN: CRP: 0.5 mg/dL (ref <1.0)

## 2021-01-25 NOTE — Patient Instructions (Addendum)
Your procedure is scheduled on: 02/05/21 - Monday Report to the Registration Desk on the 1st floor of the [facility]. To find out your arrival time, please call [phone] between 1PM - 3PM on: 02/02/21 - Friday - Report to [facility] for Covid Test on 02/01/21 at 0835 am.  REMEMBER: Instructions that are not followed completely may result in serious medical risk, up to and including death; or upon the discretion of your surgeon and anesthesiologist your surgery may need to be rescheduled.  Do not eat food after midnight the night before surgery.  No gum chewing, lozengers or hard candies.  You may however, drink CLEAR liquids up to 2 hours before you are scheduled to arrive for your surgery. Do not drink anything within 2 hours of your scheduled arrival time.  Clear liquids include: - water  - apple juice without pulp - gatorade (not RED, PURPLE, OR BLUE) - black coffee or tea (Do NOT add milk or creamers to the coffee or tea) Do NOT drink anything that is not on this list.  Type 1 and Type 2 diabetics should only drink water.   TAKE THESE MEDICATIONS THE MORNING OF SURGERY WITH A SIP OF WATER:  - budesonide-formoterol (SYMBICORT) 160-4.5 MCG/ACT inhaler - DULoxetine (CYMBALTA) 30 MG capsule - esomeprazole (NEXIUM) 40 MG capsule, (take one the night before and one on the morning of surgery - helps to prevent nausea after    surgery.) - fluticasone (FLONASE) 50 MCG/ACT nasal spray - gabapentin (NEURONTIN) 300 MG capsule - methimazole (TAPAZOLE) 10 MG tablet  - propranolol (INDERAL) 40 MG tablet   One week prior to surgery: Stop Anti-inflammatories (NSAIDS) such as Advil, Aleve, Ibuprofen, Motrin, Naproxen, Naprosyn and Aspirin based products such as Excedrin, Goodys Powder, BC Powder.  Stop beginning 01/28/65 ,ANY OVER THE COUNTER supplements until after surgery.  You may however, continue to take Tylenol if needed for pain up until the day of surgery.  No Alcohol for  24 hours before or after surgery.  No Smoking including e-cigarettes for 24 hours prior to surgery.  No chewable tobacco products for at least 6 hours prior to surgery.  No nicotine patches on the day of surgery.  Do not use any "recreational" drugs for at least a week prior to your surgery.  Please be advised that the combination of cocaine and anesthesia may have negative outcomes, up to and including death. If you test positive for cocaine, your surgery will be cancelled.  On the morning of surgery brush your teeth with toothpaste and water, you may rinse your mouth with mouthwash if you wish. Do not swallow any toothpaste or mouthwash.  Do not wear jewelry, make-up, hairpins, clips or nail polish.  Do not wear lotions, powders, or perfumes.   Do not shave body from the neck down 48 hours prior to surgery just in case you cut yourself which could leave a site for infection.  Also, freshly shaved skin may become irritated if using the CHG soap.  Contact lenses, hearing aids and dentures may not be worn into surgery.  Do not bring valuables to the hospital. [facility] is not responsible for any missing/lost belongings or valuables.   Use CHG Soap or wipes as directed on instruction sheet.  Notify your doctor if there is any change in your medical condition (cold, fever, infection).  Wear comfortable clothing (specific to your surgery type) to the hospital.  Plan for stool softeners for home use; pain medications have a tendency  to cause constipation. You can also help prevent constipation by eating foods high in fiber such as fruits and vegetables and drinking plenty of fluids as your diet allows.  After surgery, you can help prevent lung complications by doing breathing exercises.  Take deep breaths and cough every 1-2 hours. Your doctor may order a device called an Incentive Spirometer to help you take deep breaths. When coughing or sneezing, hold a pillow firmly against your  incision with both hands. This is called "splinting." Doing this helps protect your incision. It also decreases belly discomfort.  If you are being admitted to the hospital overnight, leave your suitcase in the car. After surgery it may be brought to your room.  If you are being discharged the day of surgery, you will not be allowed to drive home. You will need a responsible adult (18 years or older) to drive you home and stay with you that night.   If you are taking public transportation, you will need to have a responsible adult (18 years or older) with you. Please confirm with your physician that it is acceptable to use public transportation.   Please call the [facility]. at [phone] if you have any questions about these instructions.  Surgery Visitation Policy:  Patients undergoing a surgery or procedure may have one family member or support person with them as long as that person is not COVID-19 positive or experiencing its symptoms.  That person may remain in the waiting area during the procedure.  Inpatient Visitation:    Visiting hours are 7 a.m. to 8 p.m. Inpatients will be allowed two visitors daily. The visitors may change each day during the patient's stay. No visitors under the age of 65. Any visitor under the age of 3 must be accompanied by an adult. The visitor must pass COVID-19 screenings, use hand sanitizer when entering and exiting the patient's room and wear a mask at all times, including in the patient's room. Patients must also wear a mask when staff or their visitor are in the room. Masking is required regardless of vaccination status.  Total Hip/Knee Replacement Preoperative Educational Video  To better prepare for surgery, please view our videos that explain the physical activity and discharge planning required to have the best surgical recovery at [facility].  [email or web address]      Questions?  Call [phone] or email [email or web address]

## 2021-01-26 DIAGNOSIS — M25561 Pain in right knee: Secondary | ICD-10-CM | POA: Diagnosis not present

## 2021-01-26 LAB — URINE CULTURE
Culture: 40000 — AB
Special Requests: NORMAL

## 2021-01-26 LAB — HEMOGLOBIN A1C
Hgb A1c MFr Bld: 6.2 % — ABNORMAL HIGH (ref 4.8–5.6)
Mean Plasma Glucose: 131 mg/dL

## 2021-01-30 ENCOUNTER — Telehealth: Payer: Self-pay | Admitting: Student in an Organized Health Care Education/Training Program

## 2021-01-30 ENCOUNTER — Encounter: Payer: Medicare HMO | Admitting: Student in an Organized Health Care Education/Training Program

## 2021-01-30 NOTE — Telephone Encounter (Signed)
Christine Higgins, from [facility] Calling in reference to patient medications. Christine Higgins is going into a long term Care Facility. They are needing to get her medications list and dosages. Please call them

## 2021-01-30 NOTE — Telephone Encounter (Signed)
Spoke with Christine Higgins, she has a question about a medication that is not prescribed at this office.

## 2021-02-01 ENCOUNTER — Other Ambulatory Visit
Admission: RE | Admit: 2021-02-01 | Discharge: 2021-02-01 | Disposition: A | Discharge status: Home / Self Care | Payer: Medicare HMO | Source: Ambulatory Visit | Attending: Orthopedic Surgery | Admitting: Orthopedic Surgery

## 2021-02-01 ENCOUNTER — Other Ambulatory Visit: Payer: Self-pay

## 2021-02-01 DIAGNOSIS — Z20822 Contact with and (suspected) exposure to covid-19: Secondary | ICD-10-CM | POA: Insufficient documentation

## 2021-02-01 DIAGNOSIS — Z01812 Encounter for preprocedural laboratory examination: Secondary | ICD-10-CM | POA: Diagnosis not present

## 2021-02-01 LAB — SARS CORONAVIRUS 2 (TAT 6-24 HRS): SARS Coronavirus 2: NEGATIVE

## 2021-02-02 ENCOUNTER — Other Ambulatory Visit: Payer: Self-pay | Admitting: Dermatology

## 2021-02-02 DIAGNOSIS — L299 Pruritus, unspecified: Secondary | ICD-10-CM

## 2021-02-02 DIAGNOSIS — R21 Rash and other nonspecific skin eruption: Secondary | ICD-10-CM

## 2021-02-04 MED ORDER — SODIUM CHLORIDE 0.9 % IV SOLN: INTRAVENOUS | Status: DC

## 2021-02-04 MED ORDER — CEFAZOLIN SODIUM-DEXTROSE 2-4 GM/100ML-% IV SOLN
2.0000 g | INTRAVENOUS | Status: AC
  Administered 2021-02-05: 1 g via INTRAVENOUS
  Administered 2021-02-05: 2 g via INTRAVENOUS

## 2021-02-04 MED ORDER — ORAL CARE MOUTH RINSE: 15.0000 mL | Freq: Once | OROMUCOSAL | Status: AC

## 2021-02-04 MED ORDER — GABAPENTIN 300 MG PO CAPS: 300.0000 mg | ORAL_CAPSULE | Freq: Once | ORAL | Status: DC

## 2021-02-04 MED ORDER — CHLORHEXIDINE GLUCONATE 4 % EX LIQD: 60.0000 mL | Freq: Once | CUTANEOUS | Status: DC

## 2021-02-04 MED ORDER — CHLORHEXIDINE GLUCONATE 0.12 % MT SOLN: 15.0000 mL | Freq: Once | OROMUCOSAL | Status: AC

## 2021-02-04 MED ORDER — DEXAMETHASONE SODIUM PHOSPHATE 10 MG/ML IJ SOLN: 8.0000 mg | Freq: Once | INTRAMUSCULAR | Status: AC

## 2021-02-04 MED ORDER — CELECOXIB 200 MG PO CAPS: 400.0000 mg | ORAL_CAPSULE | Freq: Once | ORAL | Status: AC

## 2021-02-04 MED ORDER — TRANEXAMIC ACID-NACL 1000-0.7 MG/100ML-% IV SOLN
1000.0000 mg | INTRAVENOUS | Status: AC
  Administered 2021-02-05: 1000 mg via INTRAVENOUS

## 2021-02-04 NOTE — H&P (Signed)
ORTHOPAEDIC HISTORY & PHYSICAL Gwenlyn Fudge, [place] - 01/25/2021 1:30 PM EDT Formatting of this note is different from the original. [facility] Chief Complaint:   Chief Complaint  Patient presents with   Right Knee - Pain  History & Physical   History of Present Illness:   Christine Higgins is a 65 y.o. female that presents to clinic today for her preoperative history and evaluation. The patient is scheduled to undergo a right total knee arthroplasty on 02/05/21 by Dr. Marry Guan. Her pain began sever years ago. The pain is located along the lateral aspect of the knee. She describes her pain as worse with any weightbearing. She reports associated some swelling and some giving way of the knee. She denies associated numbness or tingling, denies locking.   The patient's symptoms have progressed to the point that they decrease her quality of life. The patient has previously undergone conservative treatment including NSAIDS and injections to the knee without adequate control of her symptoms.  Patient does have a spinal stimulator. Denies history of lumbar fusion. Denies history of blood clots.   Patient is currently taking Otezla for treatment of psoriatic arthritis.   Past Medical, Surgical, Family, Social History, Allergies, Medications:   Past Medical History:  Past Medical History:  Diagnosis Date   Atherosclerosis of abdominal aorta (CMS-HCC) 07/29/2014  On 2014 lumbar xray, atorvastatin   Bilateral arm weakness 07/16/2016   Carpal tunnel syndrome   Cervical disc disease   Cervical spondylosis 09/14/2018   Chronic low back pain 02/13/2016   Chronic pain syndrome   COPD (chronic obstructive pulmonary disease) (CMS-HCC)   Degeneration of lumbar intervertebral disc 03/22/2016   Depression, major, in remission (CMS-HCC) 12/21/2015  On cymbalta, trazodone and clonazepam   DM II (diabetes mellitus, type II), controlled (CMS-HCC) 04/19/2014    Failed back surgical syndrome 06/23/2017   Fibromyalgia   Generalized anxiety disorder 06/23/2017   GERD (gastroesophageal reflux disease)   Graves disease   Graves' disease 10/01/2019   Graves' orbitopathy 02/17/2017   Health care maintenance 06/21/2015  GYN per pt also, colonoscopy due in 4 years per MI, pneumovax 1-18, hcv 1-18, shingrix 5-18 MEDICARE WELLNESS VISIT PROVIDERS RENDERING CARE Dr Ouida Sills, [facility] gyn and GI FUNCTIONAL ASSESSMENT (1) Hearing: Demonstrates normal hearing in conversation. (2) Risk of Falls: No reports of falls or abnormal balance. Gait is observed to be good upon observation. (3) Home Safety; Home is safe and secur   Hemorrhoids 03/14/2014   History of colon polyps 07/09/2019   History of lumbar laminectomy for spinal cord decompression 02/25/2019   HTN, goal below 140/90 03/05/2017   Hyperlipidemia   Hypertension   Impingement syndrome of shoulder, left 10/20/2014   Lumbar disc disease   Migraines 02/03/2014   Mild intermittent asthma 06/16/2019   Mild intermittent asthma with acute exacerbation 08/13/2018   Multiple thyroid nodules 10/01/2019   Neutropenia (CMS-HCC) 02/16/2019   Obesity   Osteoarthritis   Osteopenia  DEXA scan 07/28/2018   Osteoporosis 02/03/2014   Pelvic pain in female   Polyarthralgia 02/02/2019   Primary insomnia 06/23/2017   Primary osteoarthritis involving multiple joints 02/16/2019  LEFT Total knee arthroplasty, cervical spondylosis, right knee osteoarthritis   Primary osteoarthritis of right knee 10/18/2016   Psoriasis 12/22/2019   Psoriatic arthritis (CMS-HCC)   Rash and nonspecific skin eruption 01/07/2019   Right carpal tunnel syndrome 03/27/2017   Rotator cuff tendinitis, left 09/14/2018   Screening for tuberculosis 12/22/2019   Seasonal  allergies   Severe obesity (BMI 35.0-35.9 with comorbidity) (CMS-HCC) 08/09/2014   Status post reverse total shoulder replacement, left 06/24/2019   Status post total left knee replacement 09/28/2017   Status  post total replacement of right hip 08/21/2015   Urticaria, chronic 07/14/2019  recurrent   Past Surgical History:  Past Surgical History:  Procedure Laterality Date   BACK SURGERY 10/30/2010  Anterior Cervical Diskectomy and fusion C5-6 with removal of posterior osteophytes, Insertion of interbody device, trabecular metal implant B3-3, Application of anterior cervical spinal fixation 24 mm Trinica Plate   BACK SURGERY 2009  Lumbar spine surgery   Bilateral Carpal Tunnel Release   CARPAL TUNNEL RELEASE   COLONOSCOPY 10/05/2001  hemorrhoids and IBS   COLONOSCOPY 09/17/2013  repeat 09/18/2023 (MUS)   COLONOSCOPY   COLONOSCOPY 10/20/2020  Normal colon/Repeat 78yr/CTL   EGD 09/17/2013  10/05/2001, 10/10/2005. No repeat per MUS.   EGD 06/26/2017  GERD no repeat MUS   HEMORRHOIDECTOMY BY SIMPLE LIGATION 2015  Dr. WRochel Brome  Left total knee arthroplasty using computer-assisted navigation 08/11/2017  Dr HMarry Guan  Neck surgery   Reverse left total shoulder arthroplasty Left 06/24/2019  Dr.Poggi   Right knee arthrosopy, partial medial and lateral meniscectomies, and chondroplasty 07/02/2018  Dr HMarry Guan  Right Shoulder Surgery Right 2005  BONE SPUR REMOVAL   Right total hip arthroplasty Right 08/24/2014   Scar tissue excision Right  Right Hand   SPINE SURGERY   THORACIC SPINAL CORD STIMULATOR VIA LAMINECTOMY, PULSE GENERATOR 08/07/2020  [facility]   Current Medications:  Current Outpatient Medications  Medication Sig Dispense Refill   acetaminophen (TYLENOL ARTHRITIS PAIN) 650 MG ER tablet Take 650 mg by mouth every 8 (eight) hours as needed for Pain   ascorbic acid, vitamin C, (VITAMIN C) 1000 MG tablet Take 1,000 mg by mouth 2 (two) times daily   atorvastatin (LIPITOR) 10 MG tablet Take 1 tablet (10 mg total) by mouth once daily   BD ALCOHOL SWABS PadM   budesonide-formoteroL (SYMBICORT) 160-4.5  mcg/actuation inhaler Inhale 2 inhalations into the lungs 2 (two) times daily   calcium carbonate 600 mg calcium (1,500 mg) Tab tablet Take 600 mg by mouth every other day   CALCIUM CITRATE ORAL Take by mouth   CALCIUM-VIT D3-MAG GLY-ZINC ORAL Take 2 tablets by mouth 2 (two) times daily   celecoxib (CELEBREX) 200 MG capsule TAKE 1 CAPSULE (200 MG TOTAL) BY MOUTH ONCE DAILY 1-2 TIMES A DAY 1832capsule 3   certolizumab pegoL (CIMZIA) 400 mg/2 mL (200 mg/mL x 2) injection kit Inject 400 mg subcutaneously every 28 (twenty-eight) days   cholecalciferol (VITAMIN D3) 5,000 unit capsule Take 10,000 Units by mouth once daily   ciclopirox (LOPROX) 0.77 % cream APPLY TOPICALLY AS NEEDED. TO ABDOMEN, GROIN AND BREASTS   clobetasoL (CORMAX) 0.05 % external solution Apply 1 Application topically 2 (two) times daily To affected area   clonazePAM (KLONOPIN) 1 MG tablet Take 1 tablet (1 mg total) by mouth nightly as needed for Anxiety 30 tablet 2   clotrimazole-betamethasone (LOTRISONE) 1-0.05 % cream Apply 1 Application topically 2 (two) times daily To affected area   co-enzyme Q-10, ubiquinone, 200 mg capsule Take 1 capsule by mouth 3 (three) times a day   cyanocobalamin, vitamin B-12, 500 mcg Lozg Take 500 mcg by mouth once daily   diphenhydrAMINE (BENADRYL) 25 mg tablet Take 25 mg by mouth nightly as needed  diphenhydrAMINE-zinc acetate (BENADRYL EXTRA STRENGTH) cream Apply topically 3 (three) times daily as needed for Itching   DULoxetine (CYMBALTA) 30 MG DR capsule TAKE 1 CAPSULE BY MOUTH NIGHTLY. 90 capsule 3   DULoxetine (CYMBALTA) 60 MG DR capsule Take 1 capsule (60 mg total) by mouth once daily 90 capsule 5   esomeprazole (NEXIUM) 40 MG DR capsule TAKE 1 CAPSULE (40 MG TOTAL) BY MOUTH EVERY 12 (TWELVE) HOURS 180 capsule 3   fexofenadine (ALLEGRA) 180 MG tablet Take 1 tablet (180 mg total) by mouth once daily 30 tablet 3   fluconazole (DIFLUCAN) 100 MG tablet Take 100 mg by mouth once daily    fluocinolone (DERMA-SMOOTHE) 0.01 % external oil Apply 1 Application topically once daily   fluticasone propionate (FLONASE) 50 mcg/actuation nasal spray Place 2 sprays into both nostrils once daily   gabapentin (NEURONTIN) 300 MG capsule Take 3 capsules (900 mg total) by mouth 3 (three) times daily   hydrALAZINE (APRESOLINE) 25 MG tablet Take 1 tablet (25 mg total) by mouth 3 (three) times daily as needed Take 1 tablet three times a day as needed for blood pressure over 160/100   hydrOXYzine HCl (ATARAX) 10 MG tablet Take 1-3 tablets by mouth nightly 2   Lactobac 40-Bifido 3-S.thermop 100 billion cell Cap Take 1 capsule by mouth 2 (two) times daily.   LINZESS 290 mcg capsule TAKE 1 CAPSULE BY MOUTH EVERY DAY 90 capsule 1   losartan-hydrochlorothiazide (HYZAAR) 100-25 mg tablet Take 1 tablet by mouth once daily   methIMAzole (TAPAZOLE) 10 MG tablet Take 1 tablet (10 mg total) by mouth once daily 90 tablet 1   methocarbamoL (ROBAXIN) 750 MG tablet TAKE 1 TABLET (750 MG TOTAL) BY MOUTH EVERY 6 (SIX) HOURS 80 tablet 0   multivit with calcium,iron,min (WOMEN'S DAILY MULTIVITAMIN ORAL) Take by mouth   propranoloL (INDERAL) 40 MG tablet Take 1 tablet (40 mg total) by mouth 2 (two) times daily   sucralfate (CARAFATE) 1 gram tablet Take 1 tablet (1 g total) by mouth 4 (four) times daily before meals and nightly   traMADoL (ULTRAM) 50 mg tablet Take 1 tablet (50 mg total) by mouth 2 (two) times daily as needed for Pain 30 tablet 0   traZODone (DESYREL) 150 MG tablet Take 1 tablet (150 mg total) by mouth nightly   triamcinolone 0.1 % cream Apply topically 2 (two) times daily   TRUE METRIX GLUCOSE METER kit USE AS DIRECTED 1 kit 0   TRUE METRIX GLUCOSE TEST STRIP test strip TEST BLOOD SUGAR THREE TIMES DAILY AS DIRECTED 300 strip 5   TRUEPLUS LANCETS   vitamin E 400 UNIT capsule Take 400 Units by mouth once daily.   vits A,C,E/lutein/minerals (ANTIOXIDANT VITAMINS ORAL) Take by mouth   No current  facility-administered medications for this visit.   Allergies:  Allergies  Allergen Reactions   Keflex [Cephalexin] Hives   Aspirin Nausea And Vomiting and Other (See Comments)   Ibuprofen Other (See Comments)  Flu-like symptoms   Klor-Con [Potassium Chloride] Itching and Rash   Shellfish Containing Products Nausea And Vomiting   Social History:  Social History   Socioeconomic History   Marital status: Single   Number of children: 1   Years of education: 12+   Highest education level: Some college, no degree  Occupational History   Occupation: Disabled  Tobacco Use   Smoking status: Former Smoker  Packs/day: 1.00  Years: 22.00  Pack years: 22.00  Types: Cigarettes  Quit date: 1994  Years since quitting: 28.4   Smokeless tobacco: Never Used  Vaping Use   Vaping Use: Never used  Substance and Sexual Activity   Alcohol use: No  Alcohol/week: 0.0 standard drinks   Drug use: No   Sexual activity: Not Currently  Partners: Male  Birth control/protection: Post-menopausal   Family History:  Family History  Problem Relation Age of Onset   High blood pressure (Hypertension) Mother   Rheum arthritis Mother   Breast cancer Mother   High blood pressure (Hypertension) Father   Heart disease Father   Heart failure Father   Myocardial Infarction (Heart attack) Father   Liver disease Brother   Arthritis Sister   High blood pressure (Hypertension) Sister   No Known Problems Maternal Grandmother   No Known Problems Maternal Grandfather   No Known Problems Paternal Grandmother   No Known Problems Paternal Grandfather   Heart disease Sister   Allergies Sister   No Known Problems Brother   No Known Problems Brother   No Known Problems Brother   Thyroid disease Daughter   Allergies Daughter   Stroke Paternal Aunt   Stroke Paternal Aunt   Liver disease Maternal Uncle   Review of Systems:   A 10+ ROS was performed, reviewed, and the pertinent orthopaedic findings are  documented in the HPI.   Physical Examination:   BP 130/84 (BP Location: Left upper arm, Patient Position: Sitting, BP Cuff Size: Adult)  Ht 180.3 cm (5' 11")  Wt (!) 114 kg (251 lb 6.4 oz)  LMP (LMP Unknown)  BMI 35.06 kg/m   Patient is a well-developed, well-nourished female in no acute distress. Patient has normal mood and affect. Patient is alert and oriented to person, place, and time.   HEENT: Atraumatic, normocephalic. Pupils equal and reactive to light. Extraocular motion intact. Noninjected sclera.  Cardiovascular: Regular rate and rhythm, with no murmurs, rubs, or gallops. Distal pulses palpable.  Respiratory: Lungs clear to auscultation bilaterally.   Right Knee: Soft tissue swelling: mild Effusion: none Erythema: none Crepitance: mild Tenderness: lateral Alignment: relative valgus Mediolateral laxity: lateral pseudolaxity Posterior sag: negative Patellar tracking: Good tracking without evidence of subluxation or tilt Atrophy: No significant atrophy.  Quadriceps tone was fair to good. Range of motion: 0/12/114 degrees  Sensation intact over the saphenous, lateral sural cutaneous, superficial fibular, and deep fibular nerve distributions.  Tests Performed/Reviewed:  X-rays  No results found. Previous xrays of the right knee showed complete loss of lateral compartment joint space with bone on bone contact and cyst formation. Severe loss of joint space with osteophyte formation also noted in the patellofemoral joint.  Impression:   ICD-10-CM  1. Primary osteoarthritis of right knee M17.11   Plan:   The patient has end-stage degenerative changes of the right knee. It was explained to the patient that the condition is progressive in nature. Having failed conservative treatment, the patient has elected to proceed with a total joint arthroplasty. The patient will undergo a total joint arthroplasty with Dr. Marry Guan. The risks of surgery, including blood clot and  infection, were discussed with the patient. Measures to reduce these risks, including the use of anticoagulation, perioperative antibiotics, and early ambulation were discussed. The importance of postoperative physical therapy was discussed with the patient. The patient elects to proceed with surgery. The patient is instructed to stop all blood thinners prior to surgery. The patient is instructed to call the hospital the day before surgery to learn of the proper arrival time.   Contact our  office with any questions or concerns. Follow up as indicated, or sooner should any new problems arise, if conditions worsen, or if they are otherwise concerned.   Gwenlyn Fudge, [facility] [place], [place] [address] Phone: [phone]  This note was generated in part with voice recognition software and I apologize for any typographical errors that were not detected and corrected.  Electronically signed by Gwenlyn Fudge, PA at 01/28/2021 9:02 AM EDT

## 2021-02-05 ENCOUNTER — Inpatient Hospital Stay: Payer: Medicare HMO | Admitting: Anesthesiology

## 2021-02-05 ENCOUNTER — Encounter: Payer: Self-pay | Admitting: Orthopedic Surgery

## 2021-02-05 ENCOUNTER — Inpatient Hospital Stay: Payer: Medicare HMO

## 2021-02-05 ENCOUNTER — Other Ambulatory Visit: Payer: Self-pay

## 2021-02-05 ENCOUNTER — Encounter
Admission: RE | Disposition: A | Discharge status: Skilled Nursing Facility | Payer: Self-pay | Source: Home / Self Care | Attending: Orthopedic Surgery

## 2021-02-05 ENCOUNTER — Inpatient Hospital Stay
Admission: RE | Admit: 2021-02-05 | Discharge: 2021-02-08 | DRG: 470 | Disposition: A | Discharge status: Skilled Nursing Facility | Payer: Medicare HMO | Attending: Orthopedic Surgery | Admitting: Orthopedic Surgery

## 2021-02-05 DIAGNOSIS — M8949 Other hypertrophic osteoarthropathy, multiple sites: Secondary | ICD-10-CM | POA: Diagnosis not present

## 2021-02-05 DIAGNOSIS — I700 Atherosclerosis of aorta: Secondary | ICD-10-CM | POA: Diagnosis present

## 2021-02-05 DIAGNOSIS — M797 Fibromyalgia: Secondary | ICD-10-CM | POA: Diagnosis present

## 2021-02-05 DIAGNOSIS — I10 Essential (primary) hypertension: Secondary | ICD-10-CM | POA: Diagnosis not present

## 2021-02-05 DIAGNOSIS — F5101 Primary insomnia: Secondary | ICD-10-CM | POA: Diagnosis present

## 2021-02-05 DIAGNOSIS — Z96652 Presence of left artificial knee joint: Secondary | ICD-10-CM | POA: Diagnosis present

## 2021-02-05 DIAGNOSIS — J4520 Mild intermittent asthma, uncomplicated: Secondary | ICD-10-CM | POA: Diagnosis present

## 2021-02-05 DIAGNOSIS — Z96659 Presence of unspecified artificial knee joint: Secondary | ICD-10-CM

## 2021-02-05 DIAGNOSIS — R5381 Other malaise: Secondary | ICD-10-CM | POA: Diagnosis not present

## 2021-02-05 DIAGNOSIS — J449 Chronic obstructive pulmonary disease, unspecified: Secondary | ICD-10-CM | POA: Diagnosis present

## 2021-02-05 DIAGNOSIS — E119 Type 2 diabetes mellitus without complications: Secondary | ICD-10-CM | POA: Diagnosis present

## 2021-02-05 DIAGNOSIS — R279 Unspecified lack of coordination: Secondary | ICD-10-CM | POA: Diagnosis not present

## 2021-02-05 DIAGNOSIS — L409 Psoriasis, unspecified: Secondary | ICD-10-CM | POA: Diagnosis present

## 2021-02-05 DIAGNOSIS — E0500 Thyrotoxicosis with diffuse goiter without thyrotoxic crisis or storm: Secondary | ICD-10-CM | POA: Diagnosis not present

## 2021-02-05 DIAGNOSIS — Z888 Allergy status to other drugs, medicaments and biological substances status: Secondary | ICD-10-CM | POA: Diagnosis not present

## 2021-02-05 DIAGNOSIS — E785 Hyperlipidemia, unspecified: Secondary | ICD-10-CM | POA: Diagnosis present

## 2021-02-05 DIAGNOSIS — Z23 Encounter for immunization: Secondary | ICD-10-CM | POA: Diagnosis not present

## 2021-02-05 DIAGNOSIS — M6281 Muscle weakness (generalized): Secondary | ICD-10-CM | POA: Diagnosis not present

## 2021-02-05 DIAGNOSIS — Z96641 Presence of right artificial hip joint: Secondary | ICD-10-CM | POA: Diagnosis present

## 2021-02-05 DIAGNOSIS — F32A Depression, unspecified: Secondary | ICD-10-CM | POA: Diagnosis present

## 2021-02-05 DIAGNOSIS — M810 Age-related osteoporosis without current pathological fracture: Secondary | ICD-10-CM | POA: Diagnosis present

## 2021-02-05 DIAGNOSIS — M5090 Cervical disc disorder, unspecified, unspecified cervical region: Secondary | ICD-10-CM | POA: Diagnosis present

## 2021-02-05 DIAGNOSIS — M47812 Spondylosis without myelopathy or radiculopathy, cervical region: Secondary | ICD-10-CM | POA: Diagnosis present

## 2021-02-05 DIAGNOSIS — G894 Chronic pain syndrome: Secondary | ICD-10-CM | POA: Diagnosis present

## 2021-02-05 DIAGNOSIS — Z6835 Body mass index (BMI) 35.0-35.9, adult: Secondary | ICD-10-CM | POA: Diagnosis not present

## 2021-02-05 DIAGNOSIS — Z823 Family history of stroke: Secondary | ICD-10-CM

## 2021-02-05 DIAGNOSIS — M1711 Unilateral primary osteoarthritis, right knee: Principal | ICD-10-CM | POA: Diagnosis present

## 2021-02-05 DIAGNOSIS — Z91013 Allergy to seafood: Secondary | ICD-10-CM

## 2021-02-05 DIAGNOSIS — Z8249 Family history of ischemic heart disease and other diseases of the circulatory system: Secondary | ICD-10-CM

## 2021-02-05 DIAGNOSIS — Z79899 Other long term (current) drug therapy: Secondary | ICD-10-CM

## 2021-02-05 DIAGNOSIS — L4050 Arthropathic psoriasis, unspecified: Secondary | ICD-10-CM | POA: Diagnosis not present

## 2021-02-05 DIAGNOSIS — Z96651 Presence of right artificial knee joint: Secondary | ICD-10-CM | POA: Diagnosis not present

## 2021-02-05 DIAGNOSIS — Z96612 Presence of left artificial shoulder joint: Secondary | ICD-10-CM | POA: Diagnosis present

## 2021-02-05 DIAGNOSIS — F411 Generalized anxiety disorder: Secondary | ICD-10-CM | POA: Diagnosis present

## 2021-02-05 DIAGNOSIS — K219 Gastro-esophageal reflux disease without esophagitis: Secondary | ICD-10-CM | POA: Diagnosis present

## 2021-02-05 DIAGNOSIS — Z20822 Contact with and (suspected) exposure to covid-19: Secondary | ICD-10-CM | POA: Diagnosis not present

## 2021-02-05 HISTORY — PX: KNEE ARTHROPLASTY: SHX992

## 2021-02-05 LAB — POCT I-STAT, CHEM 8
BUN: 9 mg/dL (ref 8–23)
Calcium, Ion: 1.26 mmol/L (ref 1.15–1.40)
Chloride: 98 mmol/L (ref 98–111)
Creatinine, Ser: 0.6 mg/dL (ref 0.44–1.00)
Glucose, Bld: 128 mg/dL — ABNORMAL HIGH (ref 70–99)
HCT: 40 % (ref 36.0–46.0)
Hemoglobin: 13.6 g/dL (ref 12.0–15.0)
Potassium: 3.5 mmol/L (ref 3.5–5.1)
Sodium: 137 mmol/L (ref 135–145)
TCO2: 28 mmol/L (ref 22–32)

## 2021-02-05 LAB — GLUCOSE, CAPILLARY
Glucose-Capillary: 152 mg/dL — ABNORMAL HIGH (ref 70–99)
Glucose-Capillary: 155 mg/dL — ABNORMAL HIGH (ref 70–99)
Glucose-Capillary: 164 mg/dL — ABNORMAL HIGH (ref 70–99)

## 2021-02-05 SURGERY — ARTHROPLASTY, KNEE, TOTAL, USING IMAGELESS COMPUTER-ASSISTED NAVIGATION
Anesthesia: Spinal | Site: Knee | Laterality: Right

## 2021-02-05 MED ORDER — HYDRALAZINE HCL 25 MG PO TABS: 25.0000 mg | ORAL_TABLET | Freq: Three times a day (TID) | ORAL | Status: DC | PRN

## 2021-02-05 MED ORDER — PROPOFOL 500 MG/50ML IV EMUL
INTRAVENOUS | Status: DC | PRN
  Administered 2021-02-05: 50 ug/kg/min via INTRAVENOUS

## 2021-02-05 MED ORDER — PNEUMOCOCCAL VAC POLYVALENT 25 MCG/0.5ML IJ INJ
0.5000 mL | INJECTION | INTRAMUSCULAR | Status: AC
  Administered 2021-02-06: 0.5 mL via INTRAMUSCULAR
  Filled 2021-02-05: qty 0.5

## 2021-02-05 MED ORDER — SODIUM CHLORIDE 0.9 % IV SOLN: INTRAVENOUS | Status: DC

## 2021-02-05 MED ORDER — LOSARTAN POTASSIUM-HCTZ 100-25 MG PO TABS: 1.0000 | ORAL_TABLET | Freq: Every day | ORAL | Status: DC

## 2021-02-05 MED ORDER — CEFAZOLIN SODIUM-DEXTROSE 2-4 GM/100ML-% IV SOLN
INTRAVENOUS | Status: AC
  Filled 2021-02-05: qty 100

## 2021-02-05 MED ORDER — LORATADINE 10 MG PO TABS
10.0000 mg | ORAL_TABLET | Freq: Every day | ORAL | Status: DC
  Administered 2021-02-05 – 2021-02-08 (×4): 10 mg via ORAL
  Filled 2021-02-05 (×4): qty 1

## 2021-02-05 MED ORDER — ONDANSETRON HCL 4 MG/2ML IJ SOLN
4.0000 mg | Freq: Four times a day (QID) | INTRAMUSCULAR | Status: DC | PRN
  Administered 2021-02-05: 4 mg via INTRAVENOUS
  Filled 2021-02-05: qty 2

## 2021-02-05 MED ORDER — SENNOSIDES-DOCUSATE SODIUM 8.6-50 MG PO TABS
1.0000 | ORAL_TABLET | Freq: Two times a day (BID) | ORAL | Status: DC
  Administered 2021-02-05 – 2021-02-07 (×5): 1 via ORAL
  Filled 2021-02-05 (×5): qty 1

## 2021-02-05 MED ORDER — METHIMAZOLE 10 MG PO TABS
10.0000 mg | ORAL_TABLET | Freq: Every day | ORAL | Status: DC
  Administered 2021-02-06 – 2021-02-08 (×3): 10 mg via ORAL
  Filled 2021-02-05 (×3): qty 1

## 2021-02-05 MED ORDER — FERROUS SULFATE 325 (65 FE) MG PO TABS
325.0000 mg | ORAL_TABLET | Freq: Two times a day (BID) | ORAL | Status: DC
  Administered 2021-02-05 – 2021-02-08 (×6): 325 mg via ORAL
  Filled 2021-02-05 (×6): qty 1

## 2021-02-05 MED ORDER — HYDROCHLOROTHIAZIDE 25 MG PO TABS
25.0000 mg | ORAL_TABLET | Freq: Every day | ORAL | Status: DC
  Administered 2021-02-06 – 2021-02-08 (×3): 25 mg via ORAL
  Filled 2021-02-05 (×3): qty 1

## 2021-02-05 MED ORDER — PROPOFOL 10 MG/ML IV BOLUS
INTRAVENOUS | Status: AC
  Filled 2021-02-05: qty 20

## 2021-02-05 MED ORDER — PANTOPRAZOLE SODIUM 40 MG PO TBEC
40.0000 mg | DELAYED_RELEASE_TABLET | Freq: Two times a day (BID) | ORAL | Status: DC
  Administered 2021-02-05 – 2021-02-08 (×6): 40 mg via ORAL
  Filled 2021-02-05 (×6): qty 1

## 2021-02-05 MED ORDER — GABAPENTIN 300 MG PO CAPS
900.0000 mg | ORAL_CAPSULE | Freq: Three times a day (TID) | ORAL | Status: DC
  Administered 2021-02-05 – 2021-02-08 (×9): 900 mg via ORAL
  Filled 2021-02-05 (×10): qty 3

## 2021-02-05 MED ORDER — CLONAZEPAM 1 MG PO TABS: 1.0000 mg | ORAL_TABLET | Freq: Every evening | ORAL | Status: DC | PRN

## 2021-02-05 MED ORDER — BUPIVACAINE HCL (PF) 0.5 % IJ SOLN
INTRAMUSCULAR | Status: AC
  Filled 2021-02-05: qty 10

## 2021-02-05 MED ORDER — CLOBETASOL PROPIONATE 0.05 % EX OINT
TOPICAL_OINTMENT | Freq: Two times a day (BID) | CUTANEOUS | Status: DC | PRN
  Filled 2021-02-05: qty 15

## 2021-02-05 MED ORDER — FENTANYL CITRATE (PF) 100 MCG/2ML IJ SOLN
25.0000 ug | INTRAMUSCULAR | Status: DC | PRN
  Administered 2021-02-05 (×2): 50 ug via INTRAVENOUS

## 2021-02-05 MED ORDER — OXYCODONE HCL 5 MG PO TABS
10.0000 mg | ORAL_TABLET | ORAL | Status: DC | PRN
  Administered 2021-02-05 – 2021-02-08 (×11): 10 mg via ORAL
  Filled 2021-02-05 (×12): qty 2

## 2021-02-05 MED ORDER — HYDROMORPHONE HCL 1 MG/ML IJ SOLN
0.5000 mg | INTRAMUSCULAR | Status: DC | PRN
  Administered 2021-02-05 (×2): 1 mg via INTRAVENOUS
  Filled 2021-02-05 (×2): qty 1

## 2021-02-05 MED ORDER — ACETAMINOPHEN 10 MG/ML IV SOLN
1000.0000 mg | Freq: Four times a day (QID) | INTRAVENOUS | Status: AC
  Administered 2021-02-05 – 2021-02-06 (×4): 1000 mg via INTRAVENOUS
  Filled 2021-02-05 (×4): qty 100

## 2021-02-05 MED ORDER — OXYCODONE HCL 5 MG PO TABS: 5.0000 mg | ORAL_TABLET | ORAL | Status: DC | PRN

## 2021-02-05 MED ORDER — HYDROXYZINE HCL 25 MG PO TABS
25.0000 mg | ORAL_TABLET | Freq: Every evening | ORAL | Status: DC | PRN
  Filled 2021-02-05 (×2): qty 1

## 2021-02-05 MED ORDER — ADULT MULTIVITAMIN W/MINERALS CH
1.0000 | ORAL_TABLET | Freq: Every day | ORAL | Status: DC
  Administered 2021-02-06 – 2021-02-08 (×3): 1 via ORAL
  Filled 2021-02-05 (×3): qty 1

## 2021-02-05 MED ORDER — OMEGA-3-ACID ETHYL ESTERS 1 G PO CAPS
1.0000 g | ORAL_CAPSULE | ORAL | Status: DC
  Administered 2021-02-08: 1 g via ORAL
  Filled 2021-02-05: qty 1

## 2021-02-05 MED ORDER — PROBIOTIC 250 MG PO CAPS: 1.0000 | ORAL_CAPSULE | ORAL | Status: DC

## 2021-02-05 MED ORDER — TRANEXAMIC ACID-NACL 1000-0.7 MG/100ML-% IV SOLN
1000.0000 mg | Freq: Once | INTRAVENOUS | Status: AC
  Administered 2021-02-05: 1000 mg via INTRAVENOUS

## 2021-02-05 MED ORDER — TRAMADOL HCL 50 MG PO TABS
50.0000 mg | ORAL_TABLET | ORAL | Status: DC | PRN
Start: 2021-02-05 — End: 2021-02-08
  Administered 2021-02-05: 100 mg via ORAL
  Administered 2021-02-08: 50 mg via ORAL
  Filled 2021-02-05: qty 1
  Filled 2021-02-05: qty 2

## 2021-02-05 MED ORDER — PROPRANOLOL HCL 20 MG PO TABS
40.0000 mg | ORAL_TABLET | Freq: Two times a day (BID) | ORAL | Status: DC
  Administered 2021-02-06 – 2021-02-08 (×5): 40 mg via ORAL
  Filled 2021-02-05 (×6): qty 2

## 2021-02-05 MED ORDER — MIDAZOLAM HCL 2 MG/2ML IJ SOLN
INTRAMUSCULAR | Status: AC
  Filled 2021-02-05: qty 2

## 2021-02-05 MED ORDER — POLYVINYL ALCOHOL 1.4 % OP SOLN
1.0000 [drp] | Freq: Two times a day (BID) | OPHTHALMIC | Status: DC
  Administered 2021-02-05 – 2021-02-08 (×7): 1 [drp] via OPHTHALMIC
  Filled 2021-02-05: qty 15

## 2021-02-05 MED ORDER — TRANEXAMIC ACID-NACL 1000-0.7 MG/100ML-% IV SOLN
INTRAVENOUS | Status: AC
  Filled 2021-02-05: qty 100

## 2021-02-05 MED ORDER — LINACLOTIDE 290 MCG PO CAPS
290.0000 ug | ORAL_CAPSULE | Freq: Every day | ORAL | Status: DC | PRN
  Administered 2021-02-07: 290 ug via ORAL
  Filled 2021-02-05 (×3): qty 1

## 2021-02-05 MED ORDER — DULOXETINE HCL 30 MG PO CPEP
30.0000 mg | ORAL_CAPSULE | Freq: Every morning | ORAL | Status: DC
  Administered 2021-02-06 – 2021-02-08 (×3): 30 mg via ORAL
  Filled 2021-02-05 (×3): qty 1

## 2021-02-05 MED ORDER — LOSARTAN POTASSIUM 50 MG PO TABS
100.0000 mg | ORAL_TABLET | Freq: Every day | ORAL | Status: DC
  Administered 2021-02-06 – 2021-02-08 (×2): 100 mg via ORAL
  Filled 2021-02-05 (×3): qty 2

## 2021-02-05 MED ORDER — MIDAZOLAM HCL 5 MG/5ML IJ SOLN
INTRAMUSCULAR | Status: DC | PRN
  Administered 2021-02-05: 2 mg via INTRAVENOUS

## 2021-02-05 MED ORDER — METHOCARBAMOL 500 MG PO TABS
750.0000 mg | ORAL_TABLET | Freq: Three times a day (TID) | ORAL | Status: DC | PRN
  Administered 2021-02-06 – 2021-02-08 (×2): 750 mg via ORAL
  Filled 2021-02-05 (×2): qty 2

## 2021-02-05 MED ORDER — CALCIUM CARBONATE 1250 (500 CA) MG PO TABS
1.0000 | ORAL_TABLET | ORAL | Status: DC
  Administered 2021-02-05 – 2021-02-08 (×2): 500 mg via ORAL
  Filled 2021-02-05 (×2): qty 1

## 2021-02-05 MED ORDER — DEXAMETHASONE SODIUM PHOSPHATE 10 MG/ML IJ SOLN
INTRAMUSCULAR | Status: AC
  Administered 2021-02-05: 8 mg via INTRAVENOUS
  Filled 2021-02-05: qty 1

## 2021-02-05 MED ORDER — ANTI-OXIDANT PO TABS: 1.0000 | ORAL_TABLET | ORAL | Status: DC

## 2021-02-05 MED ORDER — FLUTICASONE PROPIONATE 50 MCG/ACT NA SUSP
1.0000 | Freq: Every day | NASAL | Status: DC
  Administered 2021-02-06 – 2021-02-08 (×3): 1 via NASAL
  Filled 2021-02-05: qty 16

## 2021-02-05 MED ORDER — BISACODYL 10 MG RE SUPP: 10.0000 mg | Freq: Every day | RECTAL | Status: DC | PRN

## 2021-02-05 MED ORDER — ALUM & MAG HYDROXIDE-SIMETH 200-200-20 MG/5ML PO SUSP: 30.0000 mL | ORAL | Status: DC | PRN

## 2021-02-05 MED ORDER — NEOMYCIN-POLYMYXIN B GU 40-200000 IR SOLN
Status: DC | PRN
  Administered 2021-02-05: 16 mL

## 2021-02-05 MED ORDER — PROPYLENE GLYCOL 0.6 % OP SOLN: 1.0000 [drp] | Freq: Two times a day (BID) | OPHTHALMIC | Status: DC

## 2021-02-05 MED ORDER — ACETAMINOPHEN 10 MG/ML IV SOLN
INTRAVENOUS | Status: DC | PRN
  Administered 2021-02-05: 1000 mg via INTRAVENOUS

## 2021-02-05 MED ORDER — SODIUM CHLORIDE 0.9 % IV SOLN
INTRAVENOUS | Status: DC | PRN
  Administered 2021-02-05: 10 ug/min via INTRAVENOUS

## 2021-02-05 MED ORDER — SURGIPHOR WOUND IRRIGATION SYSTEM - OPTIME
TOPICAL | Status: DC | PRN
  Administered 2021-02-05: 1 via TOPICAL

## 2021-02-05 MED ORDER — DULOXETINE HCL 60 MG PO CPEP
60.0000 mg | ORAL_CAPSULE | Freq: Every evening | ORAL | Status: DC
  Administered 2021-02-05 – 2021-02-07 (×3): 60 mg via ORAL
  Filled 2021-02-05 (×4): qty 1

## 2021-02-05 MED ORDER — CEFAZOLIN SODIUM-DEXTROSE 2-4 GM/100ML-% IV SOLN
2.0000 g | Freq: Four times a day (QID) | INTRAVENOUS | Status: AC
  Administered 2021-02-05 (×2): 2 g via INTRAVENOUS
  Filled 2021-02-05 (×2): qty 100

## 2021-02-05 MED ORDER — CICLOPIROX OLAMINE 0.77 % EX CREA: TOPICAL_CREAM | Freq: Two times a day (BID) | CUTANEOUS | Status: DC | PRN

## 2021-02-05 MED ORDER — MENTHOL 3 MG MT LOZG
1.0000 | LOZENGE | OROMUCOSAL | Status: DC | PRN
  Filled 2021-02-05: qty 9

## 2021-02-05 MED ORDER — SUCRALFATE 1 G PO TABS
1.0000 g | ORAL_TABLET | Freq: Two times a day (BID) | ORAL | Status: DC
  Administered 2021-02-05 – 2021-02-08 (×6): 1 g via ORAL
  Filled 2021-02-05 (×6): qty 1

## 2021-02-05 MED ORDER — VITAMIN B-12 1000 MCG PO TABS
5000.0000 ug | ORAL_TABLET | ORAL | Status: DC
  Administered 2021-02-05 – 2021-02-07 (×2): 5000 ug via ORAL
  Filled 2021-02-05 (×3): qty 5

## 2021-02-05 MED ORDER — DIPHENHYDRAMINE HCL 25 MG PO TABS
25.0000 mg | ORAL_TABLET | Freq: Every evening | ORAL | Status: DC | PRN
  Filled 2021-02-05: qty 1

## 2021-02-05 MED ORDER — CLOTRIMAZOLE 1 % EX CREA
TOPICAL_CREAM | Freq: Two times a day (BID) | CUTANEOUS | Status: DC | PRN
  Filled 2021-02-05: qty 15

## 2021-02-05 MED ORDER — ONDANSETRON HCL 4 MG/2ML IJ SOLN: 4.0000 mg | Freq: Once | INTRAMUSCULAR | Status: DC | PRN

## 2021-02-05 MED ORDER — ALIVE WOMENS ENERGY PO TABS: 1.0000 | ORAL_TABLET | Freq: Every day | ORAL | Status: DC

## 2021-02-05 MED ORDER — MOMETASONE FURO-FORMOTEROL FUM 200-5 MCG/ACT IN AERO
2.0000 | INHALATION_SPRAY | Freq: Two times a day (BID) | RESPIRATORY_TRACT | Status: DC
  Administered 2021-02-05 – 2021-02-08 (×6): 2 via RESPIRATORY_TRACT
  Filled 2021-02-05: qty 8.8

## 2021-02-05 MED ORDER — BUPIVACAINE HCL (PF) 0.5 % IJ SOLN
INTRAMUSCULAR | Status: DC | PRN
  Administered 2021-02-05: 3 mL

## 2021-02-05 MED ORDER — PHENOL 1.4 % MT LIQD
1.0000 | OROMUCOSAL | Status: DC | PRN
  Filled 2021-02-05: qty 177

## 2021-02-05 MED ORDER — ACETAMINOPHEN 325 MG PO TABS
325.0000 mg | ORAL_TABLET | Freq: Four times a day (QID) | ORAL | Status: DC | PRN
Start: 2021-02-06 — End: 2021-02-08

## 2021-02-05 MED ORDER — ONDANSETRON HCL 4 MG PO TABS: 4.0000 mg | ORAL_TABLET | Freq: Four times a day (QID) | ORAL | Status: DC | PRN

## 2021-02-05 MED ORDER — METOCLOPRAMIDE HCL 10 MG PO TABS
10.0000 mg | ORAL_TABLET | Freq: Three times a day (TID) | ORAL | Status: AC
  Administered 2021-02-05 – 2021-02-07 (×8): 10 mg via ORAL
  Filled 2021-02-05 (×8): qty 1

## 2021-02-05 MED ORDER — ASCORBIC ACID 500 MG PO TABS
1000.0000 mg | ORAL_TABLET | Freq: Every day | ORAL | Status: DC
  Administered 2021-02-06 – 2021-02-08 (×3): 1000 mg via ORAL
  Filled 2021-02-05 (×3): qty 2

## 2021-02-05 MED ORDER — TRAZODONE HCL 50 MG PO TABS
75.0000 mg | ORAL_TABLET | Freq: Every evening | ORAL | Status: DC | PRN
  Administered 2021-02-06 – 2021-02-07 (×2): 75 mg via ORAL
  Filled 2021-02-05 (×2): qty 2

## 2021-02-05 MED ORDER — CHLORHEXIDINE GLUCONATE 0.12 % MT SOLN
OROMUCOSAL | Status: AC
  Administered 2021-02-05: 15 mL via OROMUCOSAL
  Filled 2021-02-05: qty 15

## 2021-02-05 MED ORDER — ENOXAPARIN SODIUM 30 MG/0.3ML IJ SOSY
30.0000 mg | PREFILLED_SYRINGE | Freq: Two times a day (BID) | INTRAMUSCULAR | Status: DC
  Administered 2021-02-06 – 2021-02-08 (×5): 30 mg via SUBCUTANEOUS
  Filled 2021-02-05 (×5): qty 0.3

## 2021-02-05 MED ORDER — ACETAMINOPHEN 10 MG/ML IV SOLN
INTRAVENOUS | Status: AC
  Filled 2021-02-05: qty 100

## 2021-02-05 MED ORDER — CELECOXIB 200 MG PO CAPS
ORAL_CAPSULE | ORAL | Status: AC
  Administered 2021-02-05: 400 mg via ORAL
  Filled 2021-02-05: qty 2

## 2021-02-05 MED ORDER — MAGNESIUM HYDROXIDE 400 MG/5ML PO SUSP
30.0000 mL | Freq: Every day | ORAL | Status: DC
  Administered 2021-02-05 – 2021-02-07 (×3): 30 mL via ORAL
  Filled 2021-02-05 (×4): qty 30

## 2021-02-05 MED ORDER — ATORVASTATIN CALCIUM 10 MG PO TABS
10.0000 mg | ORAL_TABLET | Freq: Every day | ORAL | Status: DC
  Administered 2021-02-05 – 2021-02-07 (×3): 10 mg via ORAL
  Filled 2021-02-05 (×3): qty 1

## 2021-02-05 MED ORDER — VITAMIN D3 25 MCG (1000 UNIT) PO TABS
2000.0000 [IU] | ORAL_TABLET | Freq: Every evening | ORAL | Status: DC
  Administered 2021-02-05 – 2021-02-07 (×3): 2000 [IU] via ORAL
  Filled 2021-02-05 (×8): qty 2

## 2021-02-05 MED ORDER — FENTANYL CITRATE (PF) 100 MCG/2ML IJ SOLN
INTRAMUSCULAR | Status: AC
  Filled 2021-02-05: qty 2

## 2021-02-05 MED ORDER — FLUCONAZOLE 100 MG PO TABS
100.0000 mg | ORAL_TABLET | ORAL | Status: DC | PRN
  Administered 2021-02-05: 100 mg via ORAL
  Filled 2021-02-05 (×2): qty 1

## 2021-02-05 MED ORDER — SODIUM CHLORIDE 0.9 % IV SOLN
INTRAVENOUS | Status: DC | PRN
  Administered 2021-02-05: 60 mL

## 2021-02-05 MED ORDER — FLUCONAZOLE 100 MG PO TABS
100.0000 mg | ORAL_TABLET | Freq: Every day | ORAL | Status: DC
  Filled 2021-02-05: qty 1

## 2021-02-05 MED ORDER — RISAQUAD PO CAPS
1.0000 | ORAL_CAPSULE | Freq: Every day | ORAL | Status: DC
  Administered 2021-02-06 – 2021-02-08 (×3): 1 via ORAL
  Filled 2021-02-05 (×3): qty 1

## 2021-02-05 MED ORDER — PROPOFOL 1000 MG/100ML IV EMUL
INTRAVENOUS | Status: AC
  Filled 2021-02-05: qty 100

## 2021-02-05 MED ORDER — CELECOXIB 200 MG PO CAPS
200.0000 mg | ORAL_CAPSULE | Freq: Two times a day (BID) | ORAL | Status: DC
  Administered 2021-02-05 – 2021-02-08 (×6): 200 mg via ORAL
  Filled 2021-02-05 (×6): qty 1

## 2021-02-05 MED ORDER — DIPHENHYDRAMINE HCL 12.5 MG/5ML PO ELIX: 12.5000 mg | ORAL_SOLUTION | ORAL | Status: DC | PRN

## 2021-02-05 MED ORDER — BUPIVACAINE HCL (PF) 0.25 % IJ SOLN
INTRAMUSCULAR | Status: DC | PRN
  Administered 2021-02-05: 60 mL

## 2021-02-05 MED ORDER — FLEET ENEMA 7-19 GM/118ML RE ENEM: 1.0000 | ENEMA | Freq: Once | RECTAL | Status: DC | PRN

## 2021-02-05 MED ORDER — CALCIUM CARBONATE-VITAMIN D 500-200 MG-UNIT PO TABS
1.0000 | ORAL_TABLET | Freq: Every day | ORAL | Status: DC
  Administered 2021-02-05 – 2021-02-08 (×4): 1 via ORAL
  Filled 2021-02-05 (×4): qty 1

## 2021-02-05 SURGICAL SUPPLY — 76 items
ATTUNE MED DOME PAT 41 KNEE (Knees) ×1 IMPLANT
ATTUNE PS FEM RT SZ 7 CEM KNEE (Femur) ×1 IMPLANT
ATTUNE PSRP INSR SZ7 5 KNEE (Insert) ×1 IMPLANT
BASE TIBIAL ROT PLAT SZ 7 KNEE (Knees) IMPLANT
BATTERY INSTRU NAVIGATION (MISCELLANEOUS) ×8 IMPLANT
BLADE SAW 70X12.5 (BLADE) ×2 IMPLANT
BLADE SAW 90X13X1.19 OSCILLAT (BLADE) ×2 IMPLANT
BLADE SAW 90X25X1.19 OSCILLAT (BLADE) ×2 IMPLANT
BONE CEMENT GENTAMICIN (Cement) ×4 IMPLANT
BSPLAT TIB 7 CMNT ROT PLAT STR (Knees) ×1 IMPLANT
BTRY SRG DRVR LF (MISCELLANEOUS) ×4
CEMENT BONE GENTAMICIN 40 (Cement) IMPLANT
COOLER POLAR GLACIER W/PUMP (MISCELLANEOUS) ×2 IMPLANT
COVER WAND RF STERILE (DRAPES) ×2 IMPLANT
CUFF TOURN SGL QUICK 34 (TOURNIQUET CUFF) ×2
CUFF TRNQT CYL 34X4X40X1 (TOURNIQUET CUFF) IMPLANT
DRAPE 3/4 80X56 (DRAPES) ×2 IMPLANT
DRSG DERMACEA 8X12 NADH (GAUZE/BANDAGES/DRESSINGS) ×2 IMPLANT
DRSG MEPILEX SACRM 8.7X9.8 (GAUZE/BANDAGES/DRESSINGS) ×2 IMPLANT
DRSG OPSITE POSTOP 4X12 (GAUZE/BANDAGES/DRESSINGS) ×1 IMPLANT
DRSG OPSITE POSTOP 4X14 (GAUZE/BANDAGES/DRESSINGS) ×2 IMPLANT
DRSG TEGADERM 4X4.75 (GAUZE/BANDAGES/DRESSINGS) ×2 IMPLANT
DURAPREP 26ML APPLICATOR (WOUND CARE) ×4 IMPLANT
ELECT CAUTERY BLADE 6.4 (BLADE) ×2 IMPLANT
ELECT REM PT RETURN 9FT ADLT (ELECTROSURGICAL) ×2
ELECTRODE REM PT RTRN 9FT ADLT (ELECTROSURGICAL) ×1 IMPLANT
EX-PIN ORTHOLOCK NAV 4X150 (PIN) ×4 IMPLANT
GLOVE SURG ENC MOIS LTX SZ7.5 (GLOVE) ×4 IMPLANT
GLOVE SURG ENC TEXT LTX SZ7.5 (GLOVE) ×4 IMPLANT
GLOVE SURG UNDER LTX SZ8 (GLOVE) ×2 IMPLANT
GLOVE SURG UNDER POLY LF SZ7.5 (GLOVE) ×2 IMPLANT
GOWN STRL REUS W/ TWL LRG LVL3 (GOWN DISPOSABLE) ×2 IMPLANT
GOWN STRL REUS W/ TWL XL LVL3 (GOWN DISPOSABLE) ×1 IMPLANT
GOWN STRL REUS W/TWL LRG LVL3 (GOWN DISPOSABLE) ×4
GOWN STRL REUS W/TWL XL LVL3 (GOWN DISPOSABLE) ×2
HEMOVAC 400CC 10FR (MISCELLANEOUS) ×2 IMPLANT
HOLDER FOLEY CATH W/STRAP (MISCELLANEOUS) ×2 IMPLANT
HOOD PEEL AWAY FLYTE STAYCOOL (MISCELLANEOUS) ×4 IMPLANT
IRRIGATION SURGIPHOR STRL (IV SOLUTION) ×2 IMPLANT
IV NS IRRIG 3000ML ARTHROMATIC (IV SOLUTION) ×2 IMPLANT
KIT TURNOVER KIT A (KITS) ×2 IMPLANT
KNIFE SCULPS 14X20 (INSTRUMENTS) ×2 IMPLANT
LABEL OR SOLS (LABEL) ×2 IMPLANT
MANIFOLD NEPTUNE II (INSTRUMENTS) ×4 IMPLANT
NDL SAFETY ECLIPSE 18X1.5 (NEEDLE) ×1 IMPLANT
NDL SPNL 20GX3.5 QUINCKE YW (NEEDLE) ×2 IMPLANT
NEEDLE HYPO 18GX1.5 SHARP (NEEDLE) ×2
NEEDLE SPNL 20GX3.5 QUINCKE YW (NEEDLE) ×4 IMPLANT
NS IRRIG 500ML POUR BTL (IV SOLUTION) ×2 IMPLANT
PACK TOTAL KNEE (MISCELLANEOUS) ×2 IMPLANT
PAD WRAPON POLOR MULTI XL (MISCELLANEOUS) IMPLANT
PENCIL SMOKE EVACUATOR COATED (MISCELLANEOUS) ×2 IMPLANT
PIN DRILL QUICK PACK ×2 IMPLANT
PIN FIXATION 1/8DIA X 3INL (PIN) ×6 IMPLANT
PULSAVAC PLUS IRRIG FAN TIP (DISPOSABLE) ×2
SOL PREP PVP 2OZ (MISCELLANEOUS) ×2
SOLUTION PREP PVP 2OZ (MISCELLANEOUS) ×1 IMPLANT
SPONGE DRAIN TRACH 4X4 STRL 2S (GAUZE/BANDAGES/DRESSINGS) ×2 IMPLANT
SPONGE LAP 18X18 RF (DISPOSABLE) ×1 IMPLANT
STAPLER SKIN PROX 35W (STAPLE) ×2 IMPLANT
STOCKINETTE IMPERV 14X48 (MISCELLANEOUS) ×1 IMPLANT
STRAP TIBIA SHORT (MISCELLANEOUS) ×2 IMPLANT
SUCTION FRAZIER HANDLE 10FR (MISCELLANEOUS) ×2
SUCTION TUBE FRAZIER 10FR DISP (MISCELLANEOUS) ×1 IMPLANT
SUT VIC AB 0 CT1 36 (SUTURE) ×4 IMPLANT
SUT VIC AB 1 CT1 36 (SUTURE) ×4 IMPLANT
SUT VIC AB 2-0 CT2 27 (SUTURE) ×2 IMPLANT
SYR 20ML LL LF (SYRINGE) ×2 IMPLANT
SYR 30ML LL (SYRINGE) ×4 IMPLANT
TIBIAL BASE ROT PLAT SZ 7 KNEE (Knees) ×2 IMPLANT
TIP FAN IRRIG PULSAVAC PLUS (DISPOSABLE) ×1 IMPLANT
TOWEL OR 17X26 4PK STRL BLUE (TOWEL DISPOSABLE) ×2 IMPLANT
TOWER CARTRIDGE SMART MIX (DISPOSABLE) ×2 IMPLANT
TRAY FOLEY MTR SLVR 16FR STAT (SET/KITS/TRAYS/PACK) ×2 IMPLANT
WRAP-ON POLOR PAD MULTI XL (MISCELLANEOUS) ×1
WRAPON POLOR PAD MULTI XL (MISCELLANEOUS) ×2

## 2021-02-05 NOTE — Op Note (Signed)
OPERATIVE NOTE  DATE OF SURGERY:  02/05/2021  PATIENT NAME:  Christine Higgins   DOB: 1956/08/05  MRN: [id number]  PRE-OPERATIVE DIAGNOSIS: Degenerative arthrosis of the right knee, primary  POST-OPERATIVE DIAGNOSIS:  Same  PROCEDURE:  Right total knee arthroplasty using computer-assisted navigation  SURGEON:  Marciano Sequin. M.D.  ASSISTANT:  Benjaman Lobe, RNFA (present and scrubbed throughout the case, critical for assistance with exposure, retraction, instrumentation, and closure)  ANESTHESIA: spinal  ESTIMATED BLOOD LOSS: 75 mL  FLUIDS REPLACED: 1000 mL of crystalloid  TOURNIQUET TIME: 111 minutes  DRAINS: 2 medium Hemovac  SOFT TISSUE RELEASES: Anterior cruciate ligament, posterior cruciate ligament, deep medial collateral ligament, patellofemoral ligament, and posterolateral corner  IMPLANTS UTILIZED: DePuy Attune size 7 posterior stabilized femoral component (cemented), size 7 rotating platform tibial component (cemented), 41 mm medialized dome patella (cemented), and a 5 mm stabilized rotating platform polyethylene insert.  INDICATIONS FOR SURGERY: Christine Higgins is a 65 y.o. year old female with a long history of progressive knee pain. X-rays demonstrated severe degenerative changes in tricompartmental fashion. The patient had not seen any significant improvement despite conservative nonsurgical intervention. After discussion of the risks and benefits of surgical intervention, the patient expressed understanding of the risks benefits and agree with plans for total knee arthroplasty.   The risks, benefits, and alternatives were discussed at length including but not limited to the risks of infection, bleeding, nerve injury, stiffness, blood clots, the need for revision surgery, cardiopulmonary complications, among others, and they were willing to proceed.  PROCEDURE IN DETAIL: The patient was brought into the operating room and, after adequate spinal anesthesia was achieved,  a tourniquet was placed on the patient's upper thigh. The patient's knee and leg were cleaned and prepped with alcohol and DuraPrep and draped in the usual sterile fashion. A "timeout" was performed as per usual protocol. The lower extremity was exsanguinated using an Esmarch, and the tourniquet was inflated to 300 mmHg. An anterior longitudinal incision was made followed by a standard mid vastus approach. The deep fibers of the medial collateral ligament were elevated in a subperiosteal fashion off of the medial flare of the tibia so as to maintain a continuous soft tissue sleeve. The patella was subluxed laterally and the patellofemoral ligament was incised. Inspection of the knee demonstrated severe degenerative changes with full-thickness loss of articular cartilage. Osteophytes were debrided using a rongeur. Anterior and posterior cruciate ligaments were excised. Two 4.0 mm Schanz pins were inserted in the femur and into the tibia for attachment of the array of trackers used for computer-assisted navigation. Hip center was identified using a circumduction technique. Distal landmarks were mapped using the computer. The distal femur and proximal tibia were mapped using the computer. The distal femoral cutting guide was positioned using computer-assisted navigation so as to achieve a 5 distal valgus cut. The femur was sized and it was felt that a size 7 femoral component was appropriate. A size 7 femoral cutting guide was positioned and the anterior cut was performed and verified using the computer. This was followed by completion of the posterior and chamfer cuts. Femoral cutting guide for the central box was then positioned in the center box cut was performed.  Attention was then directed to the proximal tibia. Medial and lateral menisci were excised. The extramedullary tibial cutting guide was positioned using computer-assisted navigation so as to achieve a 0 varus-valgus alignment and 3 posterior slope.  The cut was performed and verified using the computer. The  proximal tibia was sized and it was felt that a size 7 tibial tray was appropriate. Tibial and femoral trials were inserted followed by insertion of a 5 mm polyethylene insert. The knee was felt to be tight laterally.  The trial components were removed and the knee was brought into full extension and distracted using the Moreland retractors.  The posterolateral corner was carefully released using a combination of electrocautery and Metzenbaum scissors.  Trial components were reinserted followed by placement of a 5 mm polyethylene trial.  This allowed for excellent mediolateral soft tissue balancing both in flexion and in full extension. Finally, the patella was cut and prepared so as to accommodate a 41 mm medialized dome patella. A patella trial was placed and the knee was placed through a range of motion with excellent patellar tracking appreciated. The femoral trial was removed after debridement of posterior osteophytes. The central post-hole for the tibial component was reamed followed by insertion of a keel punch. Tibial trials were then removed. Cut surfaces of bone were irrigated with copious amounts of normal saline using pulsatile lavage and then suctioned dry. Polymethylmethacrylate cement with gentamicin was prepared in the usual fashion using a vacuum mixer. Cement was applied to the cut surface of the proximal tibia as well as along the undersurface of a size 7 rotating platform tibial component. Tibial component was positioned and impacted into place. Excess cement was removed using [employer] fast streamer. Cement was then applied to the cut surfaces of the femur as well as along the posterior flanges of the size 7 femoral component. The femoral component was positioned and impacted into place. Excess cement was removed using [employer] fast streamer. A 5 mm polyethylene trial was inserted and the knee was brought into full extension with steady axial  compression applied. Finally, cement was applied to the backside of a 41 mm medialized dome patella and the patellar component was positioned and patellar clamp applied. Excess cement was removed using [employer] fast streamer. After adequate curing of the cement, the tourniquet was deflated after a total tourniquet time of 111 minutes. Hemostasis was achieved using electrocautery. The knee was irrigated with copious amounts of normal saline using pulsatile lavage followed by 500 ml of Surgiphor and then suctioned dry. 20 mL of 1.3% Exparel and 60 mL of 0.25% Marcaine in 40 mL of normal saline was injected along the posterior capsule, medial and lateral gutters, and along the arthrotomy site. A 5 mm stabilized rotating platform polyethylene insert was inserted and the knee was placed through a range of motion with excellent mediolateral soft tissue balancing appreciated and excellent patellar tracking noted. 2 medium drains were placed in the wound bed and brought out through separate stab incisions. The medial parapatellar portion of the incision was reapproximated using interrupted sutures of #1 Vicryl. Subcutaneous tissue was approximated in layers using first #0 Vicryl followed #2-0 Vicryl. The skin was approximated with skin staples. A sterile dressing was applied.  The patient tolerated the procedure well and was transported to the recovery room in stable condition.    Zhion Pevehouse P. Holley Bouche., M.D.

## 2021-02-05 NOTE — H&P (Signed)
The patient has been re-examined, and the chart reviewed, and there have been no interval changes to the documented history and physical.    The risks, benefits, and alternatives have been discussed at length. The patient expressed understanding of the risks benefits and agreed with plans for surgical intervention.  Mukhtar Shams P. Pearla Mckinny, Jr. M.D.    

## 2021-02-05 NOTE — Anesthesia Procedure Notes (Signed)
Spinal  Patient location during procedure: OR Start time: 02/05/2021 7:24 AM End time: 02/05/2021 7:29 AM Reason for block: surgical anesthesia Staffing Performed: resident/CRNA  Anesthesiologist: Emmie Niemann, MD Resident/CRNA: Aline Brochure, CRNA Preanesthetic Checklist Completed: patient identified, IV checked, site marked, risks and benefits discussed, surgical consent, monitors and equipment checked, pre-op evaluation and timeout performed Spinal Block Patient position: sitting Prep: ChloraPrep Patient monitoring: heart rate, continuous pulse ox, blood pressure and cardiac monitor Approach: midline Location: L4-5 Injection technique: single-shot Needle Needle type: Whitacre and Introducer  Needle gauge: 24 G Needle length: 9 cm Assessment Events: CSF return Additional Notes Negative paresthesia. Negative blood return. Positive free-flowing CSF. Expiration date of kit checked and confirmed. Patient tolerated procedure well, without complications.

## 2021-02-05 NOTE — Transfer of Care (Signed)
Immediate Anesthesia Transfer of Care Note  Patient: Christine Higgins  Procedure(s) Performed: COMPUTER ASSISTED TOTAL KNEE ARTHROPLASTY (Right: Knee)  Patient Location: PACU  Anesthesia Type:Spinal  Level of Consciousness: awake, alert  and oriented  Airway & Oxygen Therapy: Patient Spontanous Breathing and Patient connected to face mask oxygen  Post-op Assessment: Report given to RN and Post -op Vital signs reviewed and stable  Post vital signs: Reviewed and stable  Last Vitals:  Vitals Value Taken Time  BP 135/97 02/05/21 1120  Temp    Pulse 81 02/05/21 1124  Resp 23 02/05/21 1124  SpO2 100 % 02/05/21 1124  Vitals shown include unvalidated device data.  Last Pain:  Vitals:   02/05/21 0651  TempSrc: Temporal  PainSc: 0-No pain         Complications: No notable events documented.

## 2021-02-05 NOTE — Evaluation (Signed)
Physical Therapy Evaluation Patient Details Name: Christine Higgins MRN: [id number] DOB: Mar 04, 1956 Today's Date: 02/05/2021   History of Present Illness  Pt s/p R TKA 02/05/21 WBAT; PMH includes Carpal tunnel syndrome, COPD, Depression, Fibromyalgia, GERD, Graves disease, Chronic pain syndrome, DM II, Cervical disc disease - see chart for full PMH  Clinical Impression  Pt was alert, AO x4, willing to work with PT despite reported pain levels of 9/10. Pt was unable to flex her knee greater than approximately 20 degrees which was limited by pain. Pt achieved full knee extension and sensation intact to R LE. Assistance to passively move the leg into all motions was minimal due to pt self-directing motion from pain.    Pt requires 2+ min assist for R LE management and task set-up due to increased pain. Pt was able to utilize both UE to manage R LE movement from supine to sitting on EOB with PT assist for holding R LE against gravity, foot never placed fully on ground. Pain is the primary limiting factor for further mobility during today's treatment session. Pt will benefit from further PT to continue addressing mobility restrictions, decreased strength, and improve functional mobility.    Follow Up Recommendations SNF    Equipment Recommendations  Other (comment) (TBD at next venue of care)    Recommendations for Other Services       Precautions / Restrictions Precautions Precautions: Knee Precaution Booklet Issued: Yes (comment) Restrictions Weight Bearing Restrictions: Yes RLE Weight Bearing: Weight bearing as tolerated Other Position/Activity Restrictions: Keep knee extended      Mobility  Bed Mobility Overal bed mobility: Needs Assistance Bed Mobility: Sit to Supine;Supine to Sit     Supine to sit: +2 for safety/equipment;Min assist Sit to supine: Min assist;+2 for safety/equipment   General bed mobility comments: Self-directive for bed mobility, 2+ person assist for LE assist     Transfers                 General transfer comment: Pt limited by pain, unable to fully put R foot on floor due to pain  Ambulation/Gait                Stairs            Wheelchair Mobility    Modified Rankin (Stroke Patients Only)       Balance Overall balance assessment: Needs assistance Sitting-balance support: Feet supported;Bilateral upper extremity supported (L LE only, PT assist with R) Sitting balance-Leahy Scale: Poor Sitting balance - Comments: Reliant on UE support for static balance                                     Pertinent Vitals/Pain Pain Assessment: 0-10 Pain Score: 9  Pain Descriptors / Indicators: Moaning;Grimacing;Crying;Guarding Pain Intervention(s): Limited activity within patient's tolerance;Monitored during session;Premedicated before session;Repositioned;RN gave pain meds during session;Relaxation;Ice applied    Home Living Family/patient expects to be discharged to:: Assisted living Living Arrangements: Other (Comment) (ALF) Available Help at Discharge: Family;Friend(s) (Daughter, ALF) Type of Home: Assisted living Home Access: Level entry     Home Layout: One level Home Equipment: Walker - 2 wheels;Cane - single point;Shower seat - built in;Grab bars - tub/shower      Prior Function Level of Independence: Needs assistance   Gait / Transfers Assistance Needed: Pt ambs with SPC or 2-wheeled RW household distances  ADL's / Homemaking Assistance Needed: Daughter brings  groceries, meals on wheels for meals  Comments: Mod Indep with ADs for household distances     Hand Dominance   Dominant Hand: Right    Extremity/Trunk Assessment   Upper Extremity Assessment Upper Extremity Assessment: Overall WFL for tasks assessed (Able to life LE in bed, push, pull, scoot utilizing BUE)    Lower Extremity Assessment Lower Extremity Assessment: RLE deficits/detail;LLE deficits/detail RLE Deficits / Details:  Status post TKA RLE: Unable to fully assess due to pain RLE Sensation: WNL LLE Deficits / Details: Able to scoot and move against gravity for bed mobility       Communication   Communication: No difficulties  Cognition Arousal/Alertness: Awake/alert Behavior During Therapy: WFL for tasks assessed/performed Overall Cognitive Status: Within Functional Limits for tasks assessed                                        General Comments      Exercises Total Joint Exercises Quad Sets: AROM;5 reps;Right;Supine Heel Slides: AAROM;10 reps;Right;Supine Hip ABduction/ADduction: AAROM;Right;10 reps;Supine Goniometric ROM: unable to SLR due to pain, Gross measurement of ROM 0 degree knee extension, unable to bend knee past 20 degrees   Assessment/Plan    PT Assessment Patient needs continued PT services  PT Problem List Decreased strength;Decreased range of motion;Decreased activity tolerance;Decreased mobility;Decreased balance;Pain       PT Treatment Interventions DME instruction;Gait training;Stair training;Functional mobility training;Therapeutic activities;Therapeutic exercise;Manual techniques;Neuromuscular re-education;Balance training;Patient/family education;Wheelchair mobility training    PT Goals (Current goals can be found in the Care Plan section)  Acute Rehab PT Goals Patient Stated Goal: Reduce pain and increase mobility PT Goal Formulation: With patient Time For Goal Achievement: 02/19/21 Potential to Achieve Goals: Good    Frequency BID   Barriers to discharge        Co-evaluation               AM-PAC PT "6 Clicks" Mobility  Outcome Measure Help needed turning from your back to your side while in a flat bed without using bedrails?: A Lot Help needed moving from lying on your back to sitting on the side of a flat bed without using bedrails?: A Lot Help needed moving to and from a bed to a chair (including a wheelchair)?: A Lot Help needed  standing up from a chair using your arms (e.g., wheelchair or bedside chair)?: A Lot Help needed to walk in hospital room?: Total Help needed climbing 3-5 steps with a railing? : Total 6 Click Score: 10    End of Session Equipment Utilized During Treatment: Gait belt Activity Tolerance: Patient limited by pain Patient left: in bed;with call bell/phone within reach;with bed alarm set;with family/visitor present;with SCD's reapplied Nurse Communication: Mobility status PT Visit Diagnosis: Muscle weakness (generalized) (M62.81);Difficulty in walking, not elsewhere classified (R26.2);Other abnormalities of gait and mobility (R26.89);Pain Pain - Right/Left: Right Pain - part of body: Knee    Time: 7342-8768 PT Time Calculation (min) (ACUTE ONLY): 48 min   Charges:   PT Evaluation $PT Eval Low Complexity: 1 Low PT Treatments $Therapeutic Exercise: 8-22 mins $Therapeutic Activity: 23-37 mins      [employer], SPT

## 2021-02-05 NOTE — Anesthesia Preprocedure Evaluation (Signed)
Anesthesia Evaluation  Patient identified by MRN, date of birth, ID band Patient awake    Reviewed: Allergy & Precautions, NPO status , Patient's Chart, lab work & pertinent test results  History of Anesthesia Complications Negative for: history of anesthetic complications  Airway Mallampati: II  TM Distance: >3 FB Neck ROM: Full    Dental  (+) Edentulous Upper   Pulmonary asthma , COPD,  COPD inhaler, former smoker,    breath sounds clear to auscultation- rhonchi (-) wheezing      Cardiovascular hypertension, Pt. on medications (-) CAD, (-) Past MI, (-) Cardiac Stents and (-) CABG  Rhythm:Regular Rate:Normal - Systolic murmurs and - Diastolic murmurs    Neuro/Psych  Headaches, neg Seizures PSYCHIATRIC DISORDERS Anxiety Depression    GI/Hepatic Neg liver ROS, GERD  ,  Endo/Other  diabetesHyperthyroidism   Renal/GU negative Renal ROS     Musculoskeletal  (+) Arthritis , Fibromyalgia -  Abdominal (+) + obese,   Peds  Hematology   Anesthesia Other Findings Past Medical History: No date: Anxiety No date: Asthma No date: Breast mass     Comment:  LEFT x 3 months per pt and palpated by physician No date: Cervical disc disease No date: Chronic pain syndrome No date: COPD (chronic obstructive pulmonary disease) (HCC) No date: Degenerative disc disease, lumbar     Comment:  osteoarthritis No date: Depression No date: Fibromyalgia No date: Fibromyalgia No date: GERD (gastroesophageal reflux disease) No date: Glaucoma No date: Graves disease No date: Headache     Comment:  migraines No date: Hemorrhoids No date: Hyperlipidemia No date: Hypertension No date: Hyperthyroidism No date: Lumbar disc disease No date: Obesity No date: Psoriatic arthritis (HCC) No date: Thyroid disease   Reproductive/Obstetrics                             Lab Results  Component Value Date   WBC 3.0 (L)  01/25/2021   HGB 13.6 02/05/2021   HCT 40.0 02/05/2021   MCV 81.3 01/25/2021   PLT 194 01/25/2021    Anesthesia Physical Anesthesia Plan  ASA: 3  Anesthesia Plan: Spinal   Post-op Pain Management:    Induction:   PONV Risk Score and Plan: 2 and Propofol infusion  Airway Management Planned: Natural Airway  Additional Equipment:   Intra-op Plan:   Post-operative Plan:   Informed Consent: I have reviewed the patients History and Physical, chart, labs and discussed the procedure including the risks, benefits and alternatives for the proposed anesthesia with the patient or authorized representative who has indicated his/her understanding and acceptance.     Dental advisory given  Plan Discussed with: CRNA and Anesthesiologist  Anesthesia Plan Comments:         Anesthesia Quick Evaluation

## 2021-02-06 ENCOUNTER — Encounter: Payer: Self-pay | Admitting: Orthopedic Surgery

## 2021-02-06 NOTE — Progress Notes (Signed)
Vitals machine wouldn't validate pt, entered manually 

## 2021-02-06 NOTE — Progress Notes (Signed)
Physical Therapy Treatment Patient Details Name: Christine Higgins MRN: [id number] DOB: 10/21/1955 Today's Date: 02/06/2021    History of Present Illness Pt s/p R TKA 02/05/21 WBAT; PMH includes Carpal tunnel syndrome, COPD, Depression, Fibromyalgia, GERD, Graves disease, Chronic pain syndrome, DM II, Cervical disc disease - see chart for full PMH    PT Comments    Treatment session progressed to include 100 feet ambulation with RW and min-guard assist. Rest breaks were necessary due to fatigue and pain levels, but pt was motivated to continue walking following rest breaks. Pt demonstrated consistent technique when utilizing the walker. Stand-pivot transfers with min-guard are needed for safety for Chair <> BSC. Reviewed AROM exercises and educated pt to incorporate them daily in order to increase overall mobility. Future PT sessions are needed in order to increase ambulation distance and independence with ADLs.       Follow Up Recommendations  Follow surgeon's recommendation for DC plan and follow-up therapies     Equipment Recommendations  Other (comment) (TBD at next venue)    Recommendations for Other Services       Precautions / Restrictions Precautions Precautions: Knee Precaution Comments: indep with SLR, no KI required Restrictions Weight Bearing Restrictions: Yes RLE Weight Bearing: Weight bearing as tolerated Other Position/Activity Restrictions: Keep knee extended    Mobility  Bed Mobility Overal bed mobility: Needs Assistance         Sit to supine: Min guard   General bed mobility comments: Manages LLE with RLE, cues for technique: scooting, boosting    Transfers Overall transfer level: Needs assistance Equipment used: Rolling walker (2 wheeled) Transfers: Sit to/from [employer] Sit to Stand: Min guard   Squat pivot transfers: Min guard     General transfer comment: Squat pivot transfer from [place] > Bed w/ min gaurd;  Decreasd self-direction since last session  Ambulation/Gait Ambulation/Gait assistance: Min guard Gait Distance (Feet): 100 Feet Assistive device: Rolling walker (2 wheeled)       General Gait Details: 2 rest breaks due to fatigue and pain   Stairs             Wheelchair Mobility    Modified Rankin (Stroke Patients Only)       Balance Overall balance assessment: Needs assistance Sitting-balance support: Feet supported;Single extremity supported Sitting balance-Leahy Scale: Good Sitting balance - Comments: Reaches across body w/ unilateral UE support   Standing balance support: During functional activity;Bilateral upper extremity supported Standing balance-Leahy Scale: Fair Standing balance comment: Requires BUE for static stance                            Cognition Arousal/Alertness: Awake/alert Behavior During Therapy: WFL for tasks assessed/performed Overall Cognitive Status: Within Functional Limits for tasks assessed                                        Exercises Total Joint Exercises Knee Flexion: AROM;Right;Seated;10 reps Goniometric ROM: Seated Knee extension 0 degrees, Knee flexion 83 degrees    General Comments        Pertinent Vitals/Pain Pain Score: 9  Pain Location: 9.5 with mobility, 9 at rest Pain Descriptors / Indicators: Aching;Sore Pain Intervention(s): Limited activity within patient's tolerance;Ice applied;Relaxation;Monitored during session;Repositioned;Patient requesting pain meds-RN notified    Home Living Family/patient expects to be discharged to:: Assisted living  Living Arrangements: Other (Comment) (ALF) Available Help at Discharge: Family;Friend(s) Type of Home: Assisted living Home Access: Level entry   Home Layout: One level        Prior Function            PT Goals (current goals can now be found in the care plan section) Acute Rehab PT Goals Patient Stated Goal: Reduce pain and  increase mobility PT Goal Formulation: With patient Time For Goal Achievement: 02/19/21 Potential to Achieve Goals: Good Progress towards PT goals: Progressing toward goals    Frequency    BID      PT Plan Current plan remains appropriate    Co-evaluation              AM-PAC PT "6 Clicks" Mobility   Outcome Measure  Help needed turning from your back to your side while in a flat bed without using bedrails?: A Little Help needed moving from lying on your back to sitting on the side of a flat bed without using bedrails?: A Little Help needed moving to and from a bed to a chair (including a wheelchair)?: A Little Help needed standing up from a chair using your arms (e.g., wheelchair or bedside chair)?: A Little Help needed to walk in hospital room?: A Lot Help needed climbing 3-5 steps with a railing? : A Lot 6 Click Score: 16    End of Session Equipment Utilized During Treatment: Gait belt Activity Tolerance: Patient limited by pain;Patient tolerated treatment well Patient left: with call bell/phone within reach;with family/visitor present;with SCD's reapplied;in bed;with bed alarm set Nurse Communication: Mobility status PT Visit Diagnosis: Muscle weakness (generalized) (M62.81);Difficulty in walking, not elsewhere classified (R26.2);Other abnormalities of gait and mobility (R26.89);Pain Pain - Right/Left: Right Pain - part of body: Knee     Time: 1422-1510 PT Time Calculation (min) (ACUTE ONLY): 48 min  Charges:                           [employer], SPT

## 2021-02-06 NOTE — Progress Notes (Signed)
Physical Therapy Treatment Patient Details Name: Christine Higgins MRN: [id number] DOB: [birth year] Today's Date: 02/06/2021    History of Present Illness Pt s/p R TKA 02/05/21 WBAT; PMH includes Carpal tunnel syndrome, COPD, Depression, Fibromyalgia, GERD, Graves disease, Chronic pain syndrome, DM II, Cervical disc disease - see chart for full PMH    PT Comments    Pt alert and willing to work with PT. Pain levels remained elevated at 8/10 at rest, 9/10 with mobility, but pt willing to transfer and begin ambulation. Pt noted lightheadedness at rest. Blood pressure remained stable 160/73 (seated), 159/70s (standing), 173/76 post-treatment.   Pt progressed with transfers chair <> commode, requiring min-guard and reminders to decrease impulsiveness with activity. Pt demonstrated decrease impulsiveness with ambulation with correct form and technique when corrected. PT remains necessary to continue building functional independence, strength, and ROM.   Follow Up Recommendations        Equipment Recommendations  Other (comment) (TBD next avenue of care)    Recommendations for Other Services       Precautions / Restrictions Precautions Precautions: Knee Precaution Booklet Issued: Yes (comment) Precaution Comments: indep with SLR, no KI required Restrictions Weight Bearing Restrictions: Yes RLE Weight Bearing: Weight bearing as tolerated Other Position/Activity Restrictions: Keep knee extended    Mobility  Bed Mobility Overal bed mobility: Needs Assistance Bed Mobility: Supine to Sit     Supine to sit: Supervision;HOB elevated     General bed mobility comments: Pt seated in chair    Transfers Overall transfer level: Needs assistance Equipment used: Rolling walker (2 wheeled) Transfers: Sit to/from [employer] Sit to Stand: Min guard   Squat pivot transfers: Min guard     General transfer comment: Squat pivot transfer from chair <> BSC w/ min gaurd; min gaurd  sit <> stand, demonstrated correct technique w/ RW with reminders  Ambulation/Gait Ambulation/Gait assistance: Min guard Gait Distance (Feet): 12 Feet Assistive device: Rolling walker (2 wheeled) Gait Pattern/deviations: Trunk flexed         Stairs             Wheelchair Mobility    Modified Rankin (Stroke Patients Only)       Balance Overall balance assessment: Needs assistance Sitting-balance support: No upper extremity supported;Feet supported Sitting balance-Leahy Scale: Good Sitting balance - Comments: Utilizes UE for dynamic support   Standing balance support: Single extremity supported;During functional activity;Bilateral upper extremity supported Standing balance-Leahy Scale: Fair Standing balance comment: Able to reach w/ unilateral UE support without LOB                            Cognition Arousal/Alertness: Awake/alert Behavior During Therapy: Impulsive;WFL for tasks assessed/performed Overall Cognitive Status: Within Functional Limits for tasks assessed                                 General Comments: impulsive with mobility, self-directed      Exercises Other Exercises Other Exercises: Pt instructed in falls prevention, home/routines modifications, RW mgt, safety, AE/DME, compression stocking mgt, and polar care mgt, handout provided    General Comments        Pertinent Vitals/Pain Pain Assessment: 0-10 Pain Score: 8  Pain Location: 9 with mobility, 8 at rest Pain Descriptors / Indicators: Aching;Grimacing;Sore Pain Intervention(s): Limited activity within patient's tolerance;Monitored during session;Repositioned;Premedicated before session;Ice applied    Home Living Family/patient expects to  be discharged to:: Assisted living Living Arrangements: Other (Comment) (ALF) Available Help at Discharge: Family;Friend(s) (Daughter, ALF) Type of Home: Assisted living Home Access: Level entry   Home Layout: One  level Home Equipment: Walker - 2 wheels;Cane - single point;Shower seat - built in;Grab bars - tub/shower      Prior Function Level of Independence: Needs assistance  Gait / Transfers Assistance Needed: Pt ambs with SPC or 2-wheeled RW household distances ADL's / Homemaking Assistance Needed: Daughter brings groceries, ALF handles meals, meds, cleaning Comments: Mod Indep with ADs for household distances   PT Goals (current goals can now be found in the care plan section) Acute Rehab PT Goals Patient Stated Goal: Reduce pain and increase mobility PT Goal Formulation: With patient Time For Goal Achievement: 02/19/21 Potential to Achieve Goals: Good Progress towards PT goals: Progressing toward goals    Frequency    BID      PT Plan Current plan remains appropriate    Co-evaluation              AM-PAC PT "6 Clicks" Mobility   Outcome Measure  Help needed turning from your back to your side while in a flat bed without using bedrails?: A Little Help needed moving from lying on your back to sitting on the side of a flat bed without using bedrails?: A Little Help needed moving to and from a bed to a chair (including a wheelchair)?: A Little Help needed standing up from a chair using your arms (e.g., wheelchair or bedside chair)?: A Little Help needed to walk in hospital room?: A Lot Help needed climbing 3-5 steps with a railing? : A Lot 6 Click Score: 16    End of Session Equipment Utilized During Treatment: Gait belt Activity Tolerance: Patient limited by pain;Patient tolerated treatment well Patient left: with call bell/phone within reach;with family/visitor present;with SCD's reapplied;in chair;with chair alarm set   PT Visit Diagnosis: Muscle weakness (generalized) (M62.81);Difficulty in walking, not elsewhere classified (R26.2);Other abnormalities of gait and mobility (R26.89);Pain Pain - Right/Left: Right Pain - part of body: Knee     Time: 1006-1030 PT Time  Calculation (min) (ACUTE ONLY): 24 min  Charges:                        [employer], SPT

## 2021-02-06 NOTE — Evaluation (Signed)
Occupational Therapy Evaluation Patient Details Name: Christine Higgins MRN: [id number] DOB: 05-22-1956 Today's Date: 02/06/2021    History of Present Illness Pt s/p R TKA 02/05/21 WBAT; PMH includes Carpal tunnel syndrome, COPD, Depression, Fibromyalgia, GERD, Graves disease, Chronic pain syndrome, DM II, Cervical disc disease - see chart for full PMH   Clinical Impression   Pt seen for OT evaluation this date, POD#1 from above surgery. Pt was independent in all ADL prior to surgery, using SPC for mobility, and recently moved to ALF where she receives assist for meals, medication mgt, and cleaning. Pt is eager to return to PLOF with less pain and improved safety and independence. Pt currently requires minimal assist for LB dressing and bathing while in seated position due to pain and limited AROM of R knee. Pt performed bed mobility with supervision, able to doff SCDs from surgical foot without assist and remove from blue bone foam. Pt mildly impulsive, standing prior to therapist applying gait belt and securing hemovac and with R foot outside RW frame. VC required to improve safety. Pt transferred from the EOB to the recliner a few feet away with RW and CGA, requiring additional VC for safety as she approached the recliner and attempted to push RW to the side and reach outside her BOS in order to reach for arm rest. Pt instructed in falls prevention, home/routines modifications, RW mgt, safety, AE/DME, compression stocking mgt, and polar care mgt, handout provided. Pt verbalized understanding and would benefit from skilled OT services including additional instruction in dressing techniques with or without assistive devices for dressing and bathing skills to support recall and carryover prior to discharge and ultimately to maximize safety, independence, and minimize falls risk and caregiver burden. Recommend follow up OT per surgeon.    Follow Up Recommendations  Follow surgeon's recommendation for DC plan  and follow-up therapies    Equipment Recommendations  Other (comment) (reacher)    Recommendations for Other Services       Precautions / Restrictions Precautions Precautions: Knee Precaution Booklet Issued: Yes (comment) Precaution Comments: indep with SLR, no KI required Restrictions Weight Bearing Restrictions: Yes RLE Weight Bearing: Weight bearing as tolerated      Mobility Bed Mobility Overal bed mobility: Needs Assistance Bed Mobility: Supine to Sit     Supine to sit: Supervision;HOB elevated          Transfers Overall transfer level: Needs assistance Equipment used: Rolling walker (2 wheeled) Transfers: Sit to/from Stand Sit to Stand: Min guard         General transfer comment: pt gets up prematurely prior to application of gait belt (even though therapist instructs pt of gait belt prior to standing), with RLE outside RW, CGA ultimately and then cues for safe RW mgt afterwards, does leave RW behind prior to sitting in recliner and reaching outside BOS for chair arm rest.    Balance Overall balance assessment: Needs assistance Sitting-balance support: No upper extremity supported;Feet supported Sitting balance-Leahy Scale: Good     Standing balance support: Single extremity supported;During functional activity;Bilateral upper extremity supported Standing balance-Leahy Scale: Fair Standing balance comment: reached outside BOS without notable LOB when getting to recliner                           ADL either performed or assessed with clinical judgement   ADL Overall ADL's : Needs assistance/impaired  General ADL Comments: Pt able to doff foot SCD on surgical leg. Currently requires MIN A for LB ADL, CGA for ADL transfers with RW + VC for safety, *when pain controlled*     Vision Baseline Vision/History: Wears glasses Wears Glasses: Reading only Patient Visual Report: No change from  baseline       Perception     Praxis      Pertinent Vitals/Pain Pain Assessment: 0-10 Pain Score: 8  Pain Location: 8.5 with mobility, 7.5 at rest Pain Descriptors / Indicators: Aching;Guarding Pain Intervention(s): Limited activity within patient's tolerance;Monitored during session;Repositioned;Ice applied;Patient requesting pain meds-RN notified;RN gave pain meds during session     Hand Dominance Right   Extremity/Trunk Assessment Upper Extremity Assessment Upper Extremity Assessment: Overall WFL for tasks assessed (hx L TSA)   Lower Extremity Assessment Lower Extremity Assessment: RLE deficits/detail RLE Deficits / Details: Status post TKA RLE: Unable to fully assess due to pain RLE Sensation: WNL       Communication Communication Communication: No difficulties   Cognition Arousal/Alertness: Awake/alert Behavior During Therapy: Impulsive Overall Cognitive Status: Within Functional Limits for tasks assessed                                 General Comments: impulsive with mobility requiring VC for safety, RW mgt   General Comments       Exercises Other Exercises Other Exercises: Pt instructed in falls prevention, home/routines modifications, RW mgt, safety, AE/DME, compression stocking mgt, and polar care mgt, handout provided   Shoulder Instructions      Home Living Family/patient expects to be discharged to:: Assisted living Living Arrangements: Other (Comment) (ALF) Available Help at Discharge: Family;Friend(s) (Daughter, ALF) Type of Home: Assisted living Home Access: Level entry     Home Layout: One level     Bathroom Shower/Tub: Occupational psychologist: Handicapped height Bathroom Accessibility: No   Home Equipment: Environmental consultant - 2 wheels;Cane - single point;Shower seat - built in;Grab bars - tub/shower          Prior Functioning/Environment Level of Independence: Needs assistance  Gait / Transfers Assistance Needed: Pt  ambs with SPC or 2-wheeled RW household distances ADL's / Homemaking Assistance Needed: Daughter brings groceries, ALF handles meals, meds, cleaning   Comments: Mod Indep with ADs for household distances        OT Problem List: Decreased strength;Decreased range of motion;Pain;Decreased safety awareness;Impaired balance (sitting and/or standing)      OT Treatment/Interventions: Self-care/ADL training;Therapeutic exercise;Therapeutic activities;DME and/or AE instruction;Patient/family education;Balance training    OT Goals(Current goals can be found in the care plan section) Acute Rehab OT Goals Patient Stated Goal: Reduce pain and increase mobility OT Goal Formulation: With patient Time For Goal Achievement: 02/20/21 Potential to Achieve Goals: Good ADL Goals Pt Will Perform Lower Body Dressing: with modified independence;with adaptive equipment;sit to/from stand Pt Will Transfer to Toilet: with modified independence;ambulating (elevated commode, LRAD) Pt Will Perform Toileting - Clothing Manipulation and hygiene: with modified independence;sitting/lateral leans;sit to/from stand Additional ADL Goal #1: Pt will independently and safely instruct caregiver in polar care mgt Additional ADL Goal #2: Pt will independently and safely instruct caregiver in compression stocking mgt  OT Frequency: Min 2X/week   Barriers to D/C:            Co-evaluation              AM-PAC OT "6 Clicks" Daily Activity  Outcome Measure Help from another person eating meals?: None Help from another person taking care of personal grooming?: None Help from another person toileting, which includes using toliet, bedpan, or urinal?: A Little Help from another person bathing (including washing, rinsing, drying)?: A Little Help from another person to put on and taking off regular upper body clothing?: None Help from another person to put on and taking off regular lower body clothing?: A Little 6 Click  Score: 21   End of Session Nurse Communication: Mobility status;Patient requests pain meds  Activity Tolerance: Patient tolerated treatment well Patient left: in chair;with call bell/phone within reach;with chair alarm set;with nursing/sitter in room;Other (comment) (polar care in place, hemovac in place, rolled towel under R ankle)  OT Visit Diagnosis: Other abnormalities of gait and mobility (R26.89);Pain Pain - Right/Left: Right Pain - part of body: Knee                Time: 8421-0312 OT Time Calculation (min): 24 min Charges:  OT General Charges $OT Visit: 1 Visit OT Evaluation $OT Eval Moderate Complexity: 1 Mod OT Treatments $Self Care/Home Management : 8-22 mins  Hanley Hays, MPH, MS, OTR/L ascom [phone] 02/06/21, 10:00 AM

## 2021-02-06 NOTE — TOC Progression Note (Signed)
Transition of Care (TOC) - Progression Note    Patient Details  Name: Christine Higgins MRN: [id number] Date of Birth: 04/18/1956  Transition of Care (TOC) CM/SW Contact   J , RN Phone Number: 02/06/2021, 9:36 AM  Clinical Narrative:     Met with the patient and discussed DC plan and needs, she wants to go to [facility] if possible if not she will accept [facility] or [facility], Sent the bed search awaiting offers and will review with the patient       Expected Discharge Plan and Services                                                 Social Determinants of Health (SDOH) Interventions    Readmission Risk Interventions No flowsheet data found.  

## 2021-02-06 NOTE — NC FL2 (Signed)
[place] LEVEL OF CARE SCREENING TOOL     IDENTIFICATION  Patient Name: Christine Higgins Birthdate: October 30, 1955 Sex: female Admission Date (Current Location): 02/05/2021  [facility] and [place] Number:  Engineering geologist and Address:  [facility], [address], [place], [place] [address]      Provider Number: [id number]  Attending Physician Name and Address:  Dereck Leep, MD  Relative Name and Phone Number:  Bryce Kimble, daughter [phone]    Current Level of Care: Hospital Recommended Level of Care: [place] Prior Approval Number:    Date Approved/Denied:   PASRR Number: [id number]  Discharge Plan: SNF    Current Diagnoses: Patient Active Problem List   Diagnosis Date Noted   Total knee replacement status 02/05/2021   Piriformis syndrome of both sides 01/02/2021   Chronic pain 08/07/2020   Chronic midline thoracic back pain 03/27/2020   Psoriasis 12/22/2019   Psoriatic arthritis ([place]) 12/22/2019   Multiple thyroid nodules 10/01/2019   Urticaria, chronic 07/14/2019   History of colon polyps 07/09/2019   Status post reverse arthroplasty of shoulder, left 06/24/2019   History of lumbar laminectomy for spinal cord decompression (L3-L4, 2015) 02/25/2019   Lumbar radiculopathy 02/25/2019   Neutropenia ([place]) 02/16/2019   Primary osteoarthritis involving multiple joints 02/16/2019   Polyarthralgia 02/02/2019   Cervical spondylosis 09/14/2018   Tachycardia 08/18/2017   Status post total left knee replacement 08/11/2017   Chronic pain syndrome 07/30/2017   Fibromyalgia 07/30/2017   Pelvic pain in female 06/24/2017   Dysuria 06/24/2017   Generalized anxiety disorder 06/23/2017   Primary insomnia 06/23/2017   Lumbar spondylosis 06/23/2017   Stenosis of intervertebral foramina 06/23/2017   Right carpal tunnel syndrome 03/27/2017   HTN, goal below 140/90 03/05/2017   Graves' orbitopathy 02/17/2017    Primary osteoarthritis of right knee 10/18/2016   Bilateral arm weakness 07/16/2016   Spinal stenosis of lumbar region 03/22/2016   Chronic low back pain 02/13/2016   Depression, major, in remission ([place]) 12/21/2015   Failed back surgical syndrome 10/30/2015   DDD (degenerative disc disease), lumbosacral 10/30/2015   Status post total replacement of right hip 08/21/2015   Leg pain 07/28/2015   Health care maintenance 06/21/2015   Impingement syndrome of shoulder, left 10/20/2014   Severe obesity (BMI 35.0-35.9 with comorbidity) ([place]) 08/09/2014   Atherosclerosis of abdominal aorta ([place]) 07/29/2014   DM II (diabetes mellitus, type II), controlled ([place]) 04/19/2014   Hemorrhoids 03/14/2014   Osteoporosis 02/03/2014   Migraines 02/03/2014   Hyperlipidemia, unspecified 02/03/2014   GERD (gastroesophageal reflux disease) 02/03/2014    Orientation RESPIRATION BLADDER Height & Weight     Self, Time, Situation, Place  Normal Continent Weight: 113.9 kg Height:  5\' 11"  (180.3 cm)  BEHAVIORAL SYMPTOMS/MOOD NEUROLOGICAL BOWEL NUTRITION STATUS      Continent Diet (regular)  AMBULATORY STATUS COMMUNICATION OF NEEDS Skin   Extensive Assist Verbally Normal, Surgical wounds                       Personal Care Assistance Level of Assistance  Bathing, Dressing Bathing Assistance: Limited assistance   Dressing Assistance: Limited assistance     Functional Limitations Info             SPECIAL CARE FACTORS FREQUENCY  PT (By licensed PT)     PT Frequency: 5 days per week              Contractures Contractures Info:  Not present    Additional Factors Info  Allergies, Code Status Code Status Info: full Allergies Info: Cephalexin, Ibuprofen, Potassium Chloride, Shellfish Allergy, Aspirin           Current Medications (02/06/2021):  This is the current hospital active medication list Current Facility-Administered Medications  Medication Dose Route Frequency Provider Last  Rate Last Admin   0.9 %  sodium chloride infusion   Intravenous Continuous Hooten, Laurice Record, MD 100 mL/hr at 02/06/21 0307 Infusion Verify at 02/06/21 0307   acetaminophen (OFIRMEV) IV 1,000 mg  1,000 mg Intravenous Q6H Dereck Leep, MD 400 mL/hr at 02/06/21 0339 1,000 mg at 02/06/21 9528   acetaminophen (TYLENOL) tablet 325-650 mg  325-650 mg Oral Q6H PRN Hooten, Laurice Record, MD       acidophilus (RISAQUAD) capsule 1 capsule  1 capsule Oral Daily Hooten, Laurice Record, MD       alum & mag hydroxide-simeth (MAALOX/MYLANTA) 200-200-20 MG/5ML suspension 30 mL  30 mL Oral Q4H PRN Hooten, Laurice Record, MD       ascorbic acid (VITAMIN C) tablet 1,000 mg  1,000 mg Oral Daily Hooten, Laurice Record, MD       atorvastatin (LIPITOR) tablet 10 mg  10 mg Oral QPC supper Hooten, Laurice Record, MD   10 mg at 02/05/21 1839   bisacodyl (DULCOLAX) suppository 10 mg  10 mg Rectal Daily PRN Hooten, Laurice Record, MD       calcium carbonate (OS-CAL - dosed in mg of elemental calcium) tablet 500 mg of elemental calcium  1 tablet Oral Once per day on Mon Thu Hooten, James P, MD   500 mg of elemental calcium at 02/05/21 1503   calcium-vitamin D (OSCAL WITH D) 500-200 MG-UNIT per tablet 1 tablet  1 tablet Oral Daily Hooten, Laurice Record, MD   1 tablet at 02/05/21 2320   celecoxib (CELEBREX) capsule 200 mg  200 mg Oral BID Dereck Leep, MD   200 mg at 02/05/21 2304   cholecalciferol (VITAMIN D) tablet 2,000 Units  2,000 Units Oral QPM Hooten, Laurice Record, MD   2,000 Units at 02/05/21 1839   clobetasol ointment (TEMOVATE) 0.05 %   Topical BID PRN Hooten, Laurice Record, MD       clonazePAM (KLONOPIN) tablet 1 mg  1 mg Oral QHS PRN Hooten, Laurice Record, MD       clotrimazole (LOTRIMIN) 1 % cream   Topical BID PRN Hooten, Laurice Record, MD       diphenhydrAMINE (BENADRYL) 12.5 MG/5ML elixir 12.5-25 mg  12.5-25 mg Oral Q4H PRN Hooten, Laurice Record, MD       diphenhydrAMINE (BENADRYL) tablet 25 mg  25 mg Oral QHS PRN Hooten, Laurice Record, MD       DULoxetine (CYMBALTA) DR capsule 30 mg  30  mg Oral q AM Hooten, Laurice Record, MD       DULoxetine (CYMBALTA) DR capsule 60 mg  60 mg Oral QPM Hooten, Laurice Record, MD   60 mg at 02/05/21 1839   enoxaparin (LOVENOX) injection 30 mg  30 mg Subcutaneous Q12H Hooten, Laurice Record, MD   30 mg at 02/06/21 0934   ferrous sulfate tablet 325 mg  325 mg Oral BID WC Hooten, Laurice Record, MD   325 mg at 02/05/21 1839   fluconazole (DIFLUCAN) tablet 100 mg  100 mg Oral PRN Poggi, Marshall Cork, MD   100 mg at 02/05/21 1503   fluticasone (FLONASE) 50 MCG/ACT nasal spray 1 spray  1 spray  Each Nare Daily Hooten, Laurice Record, MD       gabapentin (NEURONTIN) capsule 900 mg  900 mg Oral TID Dereck Leep, MD   900 mg at 02/05/21 2302   hydrALAZINE (APRESOLINE) tablet 25 mg  25 mg Oral TID PRN Hooten, Laurice Record, MD       losartan (COZAAR) tablet 100 mg  100 mg Oral Daily Hooten, Laurice Record, MD       And   hydrochlorothiazide (HYDRODIURIL) tablet 25 mg  25 mg Oral Daily Hooten, Laurice Record, MD       HYDROmorphone (DILAUDID) injection 0.5-1 mg  0.5-1 mg Intravenous Q4H PRN Hooten, Laurice Record, MD   1 mg at 02/05/21 2011   hydrOXYzine (ATARAX/VISTARIL) tablet 25 mg  25 mg Oral QHS PRN Hooten, Laurice Record, MD       linaclotide (LINZESS) capsule 290 mcg  290 mcg Oral Daily PRN Hooten, Laurice Record, MD       loratadine (CLARITIN) tablet 10 mg  10 mg Oral Daily Hooten, Laurice Record, MD   10 mg at 02/05/21 1503   magnesium hydroxide (MILK OF MAGNESIA) suspension 30 mL  30 mL Oral Daily Hooten, Laurice Record, MD   30 mL at 02/05/21 1504   menthol-cetylpyridinium (CEPACOL) lozenge 3 mg  1 lozenge Oral PRN Hooten, Laurice Record, MD       Or   phenol (CHLORASEPTIC) mouth spray 1 spray  1 spray Mouth/Throat PRN Hooten, Laurice Record, MD       methimazole (TAPAZOLE) tablet 10 mg  10 mg Oral Daily Hooten, Laurice Record, MD       methocarbamol (ROBAXIN) tablet 750 mg  750 mg Oral Q8H PRN Hooten, Laurice Record, MD       metoCLOPramide (REGLAN) tablet 10 mg  10 mg Oral TID AC & HS Hooten, Laurice Record, MD   10 mg at 02/05/21 2304   mometasone-formoterol (DULERA)  200-5 MCG/ACT inhaler 2 puff  2 puff Inhalation BID Dereck Leep, MD   2 puff at 02/05/21 2019   multivitamin with minerals tablet 1 tablet  1 tablet Oral Daily Hooten, Laurice Record, MD       omega-3 acid ethyl esters (LOVAZA) capsule 1 g  1 g Oral Once per day on Mon Thu Hooten, Laurice Record, MD       ondansetron [facility]) tablet 4 mg  4 mg Oral Q6H PRN Hooten, Laurice Record, MD       Or   ondansetron (ZOFRAN) injection 4 mg  4 mg Intravenous Q6H PRN Hooten, Laurice Record, MD   4 mg at 02/05/21 1324   oxyCODONE (Oxy IR/ROXICODONE) immediate release tablet 10 mg  10 mg Oral Q4H PRN Dereck Leep, MD   10 mg at 02/05/21 2302   oxyCODONE (Oxy IR/ROXICODONE) immediate release tablet 5 mg  5 mg Oral Q4H PRN Hooten, Laurice Record, MD       pantoprazole (PROTONIX) EC tablet 40 mg  40 mg Oral BID Dereck Leep, MD   40 mg at 02/05/21 2304   pneumococcal 23 valent vaccine (PNEUMOVAX-23) injection 0.5 mL  0.5 mL Intramuscular Tomorrow-1000 Hooten, Laurice Record, MD       polyvinyl alcohol (LIQUIFILM TEARS) 1.4 % ophthalmic solution 1 drop  1 drop Both Eyes BID Hooten, Laurice Record, MD   1 drop at 02/05/21 2323   propranolol (INDERAL) tablet 40 mg  40 mg Oral BID Hooten, Laurice Record, MD       senna-docusate (Senokot-S) tablet 1  tablet  1 tablet Oral BID Dereck Leep, MD   1 tablet at 02/05/21 2302   sodium phosphate (FLEET) 7-19 GM/118ML enema 1 enema  1 enema Rectal Once PRN Hooten, Laurice Record, MD       sucralfate (CARAFATE) tablet 1 g  1 g Oral BID Hooten, Laurice Record, MD   1 g at 02/05/21 2304   traMADol (ULTRAM) tablet 50-100 mg  50-100 mg Oral Q4H PRN Dereck Leep, MD   100 mg at 02/05/21 1502   traZODone (DESYREL) tablet 75 mg  75 mg Oral QHS PRN Hooten, Laurice Record, MD       vitamin B-12 (CYANOCOBALAMIN) tablet 5,000 mcg  5,000 mcg Oral Chelsea Primus, MD   5,000 mcg at 02/05/21 1503     Discharge Medications: Please see discharge summary for a list of discharge medications.  Relevant Imaging Results:  Relevant Lab  Results:   Additional Information SSN: [id number]  Su Hilt, RN

## 2021-02-06 NOTE — Anesthesia Postprocedure Evaluation (Signed)
Anesthesia Post Note  Patient: Walker Shadow  Procedure(s) Performed: COMPUTER ASSISTED TOTAL KNEE ARTHROPLASTY (Right: Knee)  Patient location during evaluation: Nursing Unit Anesthesia Type: Spinal Post-procedure mental status: Patient sound asleep. arousable. Pain management: pain level controlled Vital Signs Assessment: post-procedure vital signs reviewed and stable Respiratory status: spontaneous breathing and respiratory function stable Cardiovascular status: blood pressure returned to baseline and stable Postop Assessment: no headache, no backache, no apparent nausea or vomiting and patient able to bend at knees Anesthetic complications: no   No notable events documented.   Last Vitals:  Vitals:   02/05/21 2203 02/06/21 0334  BP: 106/77 131/70  Pulse: 86 91  Resp: 18 18  Temp: 36.8 C 36.6 C  SpO2: 98% 99%    Last Pain:  Vitals:   02/06/21 0334  TempSrc: Oral  PainSc:                  Alison Stalling

## 2021-02-06 NOTE — Discharge Summary (Signed)
Physician Discharge Summary  Patient ID: Christine Higgins MRN: [id number] DOB/AGE: 1956/04/07 65 y.o.  Admit date: 02/05/2021 Discharge date: 02/08/2021  Admission Diagnoses:  Total knee replacement status [Z96.659]  Surgeries:Procedure(s): Right total knee arthroplasty using computer-assisted navigation   SURGEON:  Marciano Sequin. M.D.   ASSISTANT:  Benjaman Lobe, RNFA (present and scrubbed throughout the case, critical for assistance with exposure, retraction, instrumentation, and closure)   ANESTHESIA: spinal   ESTIMATED BLOOD LOSS: 75 mL   FLUIDS REPLACED: 1000 mL of crystalloid   TOURNIQUET TIME: 111 minutes   DRAINS: 2 medium Hemovac   SOFT TISSUE RELEASES: Anterior cruciate ligament, posterior cruciate ligament, deep medial collateral ligament, patellofemoral ligament, and posterolateral corner   IMPLANTS UTILIZED: DePuy Attune size 7 posterior stabilized femoral component (cemented), size 7 rotating platform tibial component (cemented), 41 mm medialized dome patella (cemented), and a 5 mm stabilized rotating platform polyethylene insert.  Discharge Diagnoses: Patient Active Problem List   Diagnosis Date Noted   Total knee replacement status 02/05/2021   Piriformis syndrome of both sides 01/02/2021   Chronic pain 08/07/2020   Chronic midline thoracic back pain 03/27/2020   Psoriasis 12/22/2019   Psoriatic arthritis ([place]) 12/22/2019   Multiple thyroid nodules 10/01/2019   Urticaria, chronic 07/14/2019   History of colon polyps 07/09/2019   Status post reverse arthroplasty of shoulder, left 06/24/2019   History of lumbar laminectomy for spinal cord decompression (L3-L4, 2015) 02/25/2019   Lumbar radiculopathy 02/25/2019   Neutropenia ([place]) 02/16/2019   Primary osteoarthritis involving multiple joints 02/16/2019   Polyarthralgia 02/02/2019   Cervical spondylosis 09/14/2018   Tachycardia 08/18/2017   Status post total left knee replacement 08/11/2017   Chronic  pain syndrome 07/30/2017   Fibromyalgia 07/30/2017   Pelvic pain in female 06/24/2017   Dysuria 06/24/2017   Generalized anxiety disorder 06/23/2017   Primary insomnia 06/23/2017   Lumbar spondylosis 06/23/2017   Stenosis of intervertebral foramina 06/23/2017   Right carpal tunnel syndrome 03/27/2017   HTN, goal below 140/90 03/05/2017   Graves' orbitopathy 02/17/2017   Primary osteoarthritis of right knee 10/18/2016   Bilateral arm weakness 07/16/2016   Spinal stenosis of lumbar region 03/22/2016   Chronic low back pain 02/13/2016   Depression, major, in remission ([place]) 12/21/2015   Failed back surgical syndrome 10/30/2015   DDD (degenerative disc disease), lumbosacral 10/30/2015   Status post total replacement of right hip 08/21/2015   Leg pain 07/28/2015   Health care maintenance 06/21/2015   Impingement syndrome of shoulder, left 10/20/2014   Severe obesity (BMI 35.0-35.9 with comorbidity) ([place]) 08/09/2014   Atherosclerosis of abdominal aorta ([place]) 07/29/2014   DM II (diabetes mellitus, type II), controlled ([place]) 04/19/2014   Hemorrhoids 03/14/2014   Osteoporosis 02/03/2014   Migraines 02/03/2014   Hyperlipidemia, unspecified 02/03/2014   GERD (gastroesophageal reflux disease) 02/03/2014    Past Medical History:  Diagnosis Date   Anxiety    Asthma    Breast mass    LEFT x 3 months per pt and palpated by physician   Cervical disc disease    Chronic pain syndrome    COPD (chronic obstructive pulmonary disease) (HCC)    Degenerative disc disease, lumbar    osteoarthritis   Depression    Fibromyalgia    Fibromyalgia    GERD (gastroesophageal reflux disease)    Glaucoma    Graves disease    Headache    migraines   Hemorrhoids    Hyperlipidemia    Hypertension  Hyperthyroidism    Lumbar disc disease    Obesity    Psoriatic arthritis ([place])    Thyroid disease      Transfusion:    Consultants (if any):   Discharged Condition: Improved  Hospital Course:  Christine Higgins is an 65 y.o. female who was admitted 02/05/2021 with a diagnosis of right knee osteoarthritis and went to the operating room on 02/05/2021 and underwent right total knee arthroplasty. The patient received perioperative antibiotics for prophylaxis (see below). The patient tolerated the procedure well and was transported to PACU in stable condition. After meeting PACU criteria, the patient was subsequently transferred to the Orthopaedics/Rehabilitation unit.   The patient received DVT prophylaxis in the form of early mobilization, Lovenox, Foot Pumps, and TED hose. A sacral pad had been placed and heels were elevated off of the bed with rolled towels in order to protect skin integrity. Foley catheter was discontinued on postoperative day #0. Wound drains were discontinued on postoperative day #2. The surgical incision was healing well without signs of infection.  Physical therapy was initiated postoperatively for transfers, gait training, and strengthening. Occupational therapy was initiated for activities of daily living and evaluation for assisted devices. Rehabilitation goals were reviewed in detail with the patient. The patient made steady progress with physical therapy and physical therapy recommended discharge to Skilled nursing facility.   The patient achieved the preliminary goals of this hospitalization and was felt to be medically and orthopaedically appropriate for discharge.  She was given perioperative antibiotics:  Anti-infectives (From admission, onward)    Start     Dose/Rate Route Frequency Ordered Stop   02/05/21 1500  fluconazole (DIFLUCAN) tablet 100 mg        100 mg Oral As needed 02/05/21 1447     02/05/21 1400  fluconazole (DIFLUCAN) tablet 100 mg  Status:  Discontinued        100 mg Oral Daily 02/05/21 1228 02/05/21 1447   02/05/21 1345  ceFAZolin (ANCEF) IVPB 2g/100 mL premix        2 g 200 mL/hr over 30 Minutes Intravenous Every 6 hours 02/05/21 1228 02/05/21  2055   02/05/21 0701  ceFAZolin (ANCEF) 2-4 GM/100ML-% IVPB       Note to Pharmacy: Christine Higgins   : cabinet override      02/05/21 0701 02/05/21 0737   02/05/21 0600  ceFAZolin (ANCEF) IVPB 2g/100 mL premix        2 g 200 mL/hr over 30 Minutes Intravenous On call to O.R. 02/04/21 2242 02/05/21 7673     .  Recent vital signs:  Vitals:   02/08/21 0027 02/08/21 0431  BP: (!) 155/79 130/64  Pulse: 96 79  Resp: 18 18  Temp: 98.5 F (36.9 C) 98.2 F (36.8 C)  SpO2: 99% 99%    Recent laboratory studies:  Recent Labs    02/07/21 0822  WBC 5.5  HGB 8.1*  HCT 24.0*  PLT 140*    Diagnostic Studies: DG Knee Right Port  Result Date: 02/05/2021 CLINICAL DATA:  Knee replacement EXAM: PORTABLE RIGHT KNEE - 1-2 VIEW COMPARISON:  None. FINDINGS: Post right total knee arthroplasty. Skin staples noted. Postoperative soft tissue swelling and air. A drain is present. No evidence of complication. IMPRESSION: Standard postoperative appearance of right total knee arthroplasty. Electronically Signed   By: Macy Mis M.D.   On: 02/05/2021 11:53   DG PAIN CLINIC C-ARM 1-60 MIN NO REPORT  Result Date: 01/15/2021 Fluoro was used, but no Radiologist  interpretation will be provided. Please refer to "NOTES" tab for provider progress note.   Discharge Medications:   Allergies as of 02/08/2021       Reactions   Cephalexin Hives   Ibuprofen Nausea And Vomiting   Flu like symptoms   Potassium Chloride Itching   Shellfish Allergy Nausea And Vomiting   Aspirin Nausea And Vomiting        Medication List     TAKE these medications    acetaminophen 500 MG tablet Commonly known as: TYLENOL Take 2 tablets (1,000 mg total) by mouth every 6 (six) hours.   ANTI-OXIDANT PO Take by mouth every other day.   atorvastatin 10 MG tablet Commonly known as: LIPITOR Take 10 mg by mouth daily after supper.   budesonide-formoterol 160-4.5 MCG/ACT inhaler Commonly known as: SYMBICORT Inhale 2  puffs into the lungs 2 (two) times daily.   calcium carbonate 600 MG Tabs tablet Commonly known as: OS-CAL Take 1 tablet by mouth 2 (two) times a week.   Calcium-Magnesium-Vitamin D 010-272-536 MG-MG-UNIT Tabs Take 1 tablet by mouth daily.   celecoxib 200 MG capsule Commonly known as: CELEBREX Take 200 mg by mouth 2 (two) times daily.   celecoxib 200 MG capsule Commonly known as: CELEBREX Take 200 mg by mouth in the morning and at bedtime.   ciclopirox 0.77 % cream Commonly known as: LOPROX APPLY TOPICALLY AS NEEDED. TO ABDOMEN, GROIN AND BREASTS   clonazePAM 1 MG tablet Commonly known as: KLONOPIN Take 1 tablet (1 mg total) by mouth at bedtime as needed (for sleep/anxiety).   clotrimazole-betamethasone cream Commonly known as: LOTRISONE Apply 1 application topically 2 (two) times daily as needed (irritation).   CoQ-10 200 MG Caps Take 2 capsules by mouth every other day.   diphenhydrAMINE 25 MG tablet Commonly known as: BENADRYL Take 25 mg by mouth at bedtime as needed.   DULoxetine 60 MG capsule Commonly known as: CYMBALTA Take 60 mg by mouth every evening.   DULoxetine 30 MG capsule Commonly known as: CYMBALTA Take 30 mg by mouth in the morning.   enoxaparin 40 MG/0.4ML injection Commonly known as: LOVENOX Inject 0.4 mLs (40 mg total) into the skin daily for 14 days.   esomeprazole 40 MG capsule Commonly known as: NEXIUM Take 40 mg by mouth 2 (two) times daily.   fexofenadine 180 MG tablet Commonly known as: ALLEGRA Take 180 mg by mouth daily as needed.   Fish Oil 1000 MG Caps Take 1,000 mg by mouth 2 (two) times a week.   fluconazole 100 MG tablet Commonly known as: DIFLUCAN Take 1 tablet by mouth daily as needed.   Fluocinolone Acetonide Body 0.01 % Oil APPLY TO SCALP TWICE DAILY AS NEEDED FOR ITCH.   fluticasone 50 MCG/ACT nasal spray Commonly known as: FLONASE Place 1 spray into both nostrils daily.   gabapentin 300 MG capsule Commonly  known as: NEURONTIN Take 1 capsule (300 mg total) by mouth 3 (three) times daily.   hydrALAZINE 25 MG tablet Commonly known as: APRESOLINE Take 25 mg by mouth 3 (three) times daily as needed (blood pressure 160/100 or higher).   hydrOXYzine 10 MG tablet Commonly known as: ATARAX/VISTARIL TAKE UP TO 3 TABLETS (30MG  TOTAL) BY MOUTH AT BEDTIME AS NEEDED FOR ITCHING   ICY HOT EX Apply 1 application topically 3 (three) times daily as needed (for pain.).   linaclotide 290 MCG Caps capsule Commonly known as: LINZESS Take 290 mcg by mouth daily as needed (constipation).   losartan-hydrochlorothiazide  100-25 MG tablet Commonly known as: HYZAAR Take 1 tablet by mouth daily.   methimazole 10 MG tablet Commonly known as: TAPAZOLE Take 10 mg by mouth daily.   oxyCODONE 5 MG immediate release tablet Commonly known as: Oxy IR/ROXICODONE Take 1 tablet (5 mg total) by mouth every 4 (four) hours as needed for moderate pain (pain score 4-6).   PROBIOTIC PO Take 1 capsule by mouth every other day.   propranolol 40 MG tablet Commonly known as: INDERAL Take 40 mg by mouth 2 (two) times daily.   Propylene Glycol 0.6 % Soln Place 1 drop into both eyes 2 (two) times daily.   sucralfate 1 g tablet Commonly known as: CARAFATE Take 1 g by mouth 2 (two) times daily.   terbinafine 250 MG tablet Commonly known as: LamISIL Take one tablet daily for 7 days. Repeat at the beginning of months 3, 5, and 7   traMADol 50 MG tablet Commonly known as: ULTRAM Take 2 tablets (100 mg total) by mouth every 12 (twelve) hours as needed for severe pain.   traZODone 150 MG tablet Commonly known as: DESYREL Take 75 mg by mouth at bedtime as needed for sleep.   TURMERIC PO Take 1,000 mg by mouth every other day.   vitamin A 25000 UNIT capsule Take 25,000 Units by mouth 2 (two) times a week.   Vitamin B-12 5000 MCG Tbdp Take 5,000 mcg by mouth every other day.   vitamin C 1000 MG tablet Take 1,000 mg  by mouth daily.   Vitamin D3 25 MCG (1000 UT) Caps Take 2,000 Units by mouth every evening.   VITAMIN E PO Take 400 Units by mouth every other day.       ASK your doctor about these medications    clindamycin 1 % lotion Commonly known as: CLEOCIN T Apply topically 2 (two) times daily.   clobetasol ointment 0.05 % Commonly known as: TEMOVATE CONTINUE TO MORE SEVERE AREAS OF RASH 1-2 TIMES DAILY FOR UP TO 2 WEEKS AT A TIME AS NEEDED FOR RASH. AVOID APPLYING TO FACE, GROIN, AND AXILLA. USE AS DIRECTED. RISK OF SKIN ATROPHY WITH LONG-TERM USE REVIEWED.   triamcinolone ointment 0.1 % Commonly known as: KENALOG Apply 1 application topically 2 (two) times daily. For 2 weeks. Avoid applying to face, groin, and axilla. Use as directed. Risk of skin atrophy with long-term use reviewed.               Durable Medical Equipment  (From admission, onward)           Start     Ordered   02/05/21 1229  DME Walker rolling  Once       Question:  Patient needs a walker to treat with the following condition  Answer:  Total knee replacement status   02/05/21 1228   02/05/21 1229  DME Bedside commode  Once       Question:  Patient needs a bedside commode to treat with the following condition  Answer:  Total knee replacement status   02/05/21 1228            Disposition: Skilled nursing facility     Contact information for follow-up providers     [facility] On 02/20/2021.   Specialty: Orthopedic Surgery Why: at 1:15pm Contact information: [place] [address] [phone]         Dereck Leep, MD On 03/20/2021.  Specialty: Orthopedic Surgery Why: at 2;45pm Contact information: [address] [facility] [place] [address] [phone]              Contact information for after-discharge care     Destination     [facility]  Preferred SNF .   Service: Skilled Nursing Contact information: [place], PA-C 02/08/2021, 8:27 AM

## 2021-02-06 NOTE — Progress Notes (Signed)
ORTHOPAEDICS PROGRESS NOTE  PATIENT NAME: Christine Higgins DOB: 07/07/1956  MRN: [id number]  POD # 1: Right total knee arthroplasty  Subjective: The patient rested well last night.  She states that pain is under good control this morning. Physical therapy was limited yesterday secondary to pain. She denies any nausea or vomiting.  Objective: Vital signs in last 24 hours: Temp:  [96.9 F (36.1 C)-98.3 F (36.8 C)] 97.9 F (36.6 C) (06/14 0334) Pulse Rate:  [67-91] 91 (06/14 0334) Resp:  [10-20] 18 (06/14 0334) BP: (106-142)/(70-97) 131/70 (06/14 0334) SpO2:  [96 %-100 %] 99 % (06/14 0334)  Intake/Output from previous day: 06/13 0701 - 06/14 0700 In: 3126.9 [P.O.:120; I.V.:2185.1; IV Piggyback:821.7] Out: 2175 [Urine:1500; Drains:600; Blood:75]  Recent Labs    02/05/21 0654  HGB 13.6  HCT 40.0  K 3.5  CL 98  BUN 9  CREATININE 0.60  GLUCOSE 128*    EXAM General: Well-developed well-nourished female seen in no apparent discomfort. Lungs: clear to auscultation Cardiac: normal rate and regular rhythm Abdomen: Soft, nontender, nondistended.  Bowel sounds are present. Right lower extremity: Dressing is dry and intact.  Polar Care and Hemovac are intact and functioning.  The patient can perform a straight leg raise with minimal assist.  Homans test is negative.  Good ankle dorsiflexion and plantarflexion. Neurologic: Awake, alert, and oriented.  Sensory and motor function are intact.  Assessment: Right total knee arthroplasty  Secondary diagnoses: Anxiety Asthma Chronic pain syndrome COPD Depression Fibromyalgia Gastroesophageal reflux disease Glaucoma Graves' disease Hyperlipidemia Hypertension Psoriatic arthritis  Plan: Will monitor Hemovac output. Today's goals were reviewed with the patient.  Notes from physical therapy were reviewed.  Continue physical therapy and Occupational Therapy as per total knee arthroplasty rehab protocol. DVT Prophylaxis -  Lovenox, Foot Pumps, and TED hose  Cowen Pesqueira P. Holley Bouche M.D.

## 2021-02-06 NOTE — TOC Progression Note (Addendum)
Transition of Care [facility]) - Progression Note    Patient Details  Name: Christine Higgins MRN: [id number] Date of Birth: Jan 05, 1956  Transition of Care [facility]) CM/SW [place], RN Phone Number: 02/06/2021, 12:18 PM  Clinical Narrative:      Met with the patient to review bed offers, she chose [facility], I notified [facility] via hub and via phone, [facility] health to confirm they manage the patient, They do not manage the patient, requested for [facility] to start the ins [place] came back to say that they actually do not have a bed to offer, they wont have one until at the earliest the end of the week, [facility] stated that they are not able to make a bed offer. I reached out to [place] [facility] requesting them to review for a bed offer      Expected Discharge Plan and Services                                                 Social Determinants of Health (SDOH) Interventions    Readmission Risk Interventions No flowsheet data found.

## 2021-02-07 LAB — CBC
HCT: 24 % — ABNORMAL LOW (ref 36.0–46.0)
Hemoglobin: 8.1 g/dL — ABNORMAL LOW (ref 12.0–15.0)
MCH: 28 pg (ref 26.0–34.0)
MCHC: 33.8 g/dL (ref 30.0–36.0)
MCV: 83 fL (ref 80.0–100.0)
Platelets: 140 10*3/uL — ABNORMAL LOW (ref 150–400)
RBC: 2.89 MIL/uL — ABNORMAL LOW (ref 3.87–5.11)
RDW: 14.6 % (ref 11.5–15.5)
WBC: 5.5 10*3/uL (ref 4.0–10.5)
nRBC: 0 % (ref 0.0–0.2)

## 2021-02-07 MED ORDER — ENOXAPARIN SODIUM 40 MG/0.4ML IJ SOSY: 40.0000 mg | PREFILLED_SYRINGE | INTRAMUSCULAR | 0 refills | Status: DC

## 2021-02-07 MED ORDER — OXYCODONE HCL 5 MG PO TABS: 5.0000 mg | ORAL_TABLET | ORAL | 0 refills | Status: DC | PRN

## 2021-02-07 NOTE — Progress Notes (Addendum)
This entire session was performed under direct supervision and direction of a licensed therapist. I have personally read, edited and approve of the note as written.   Lieutenant Diego PT, DPT 12:19 PM,02/07/21 [phone]   Physical Therapy Treatment Patient Details Name: Christine Higgins MRN: [id number] DOB: [birth year] Today's Date: 02/07/2021    History of Present Illness Pt s/p R TKA 02/05/21 WBAT; PMH includes Carpal tunnel syndrome, COPD, Depression, Fibromyalgia, GERD, Graves disease, Chronic pain syndrome, DM II, Cervical disc disease - see chart for full PMH    PT Comments    Continued to progress ambulation by increasing total distance to 190 ft. Verbal cues to increase RLE weight bearing as able within pain limits. Pt was able to adopt to a step-through gait pattern intermittently when RLE weight bearing was increased. Reviewed HEP to encourage active knee flexion due to the pt's fear of bending the knee. PT will continue working on overall mobility and ambulation during next treatment session in order to improve independence and safety with ADL.    Follow Up Recommendations  Follow surgeon's recommendation for DC plan and follow-up therapies     Equipment Recommendations  Other (comment) (TBD at next venue of care)    Recommendations for Other Services       Precautions / Restrictions Precautions Precautions: Knee Restrictions Weight Bearing Restrictions: Yes Other Position/Activity Restrictions: Keep knee extended    Mobility  Bed Mobility Overal bed mobility: Needs Assistance Bed Mobility: Supine to Sit     Supine to sit: Supervision          Transfers Overall transfer level: Needs assistance Equipment used: Rolling walker (2 wheeled) Transfers: Sit to/from Stand Sit to Stand: Min guard         General transfer comment: Sit > stand w/  R knee extended, encouraged increased weight bearing w/ movement  Ambulation/Gait Ambulation/Gait assistance:  Min guard Gait Distance (Feet): 190 Feet Assistive device: Rolling walker (2 wheeled)       General Gait Details: 2 rest breaks from UE fatigue, encouraged more RLE weight bearing   Stairs             Wheelchair Mobility    Modified Rankin (Stroke Patients Only)       Balance Overall balance assessment: Needs assistance Sitting-balance support: Feet supported;Single extremity supported Sitting balance-Leahy Scale: Good Sitting balance - Comments: Dynamic reaching and pulling w/ unilateral UE support     Standing balance-Leahy Scale: Fair Standing balance comment: Requires BUE for static stance                            Cognition Arousal/Alertness: Awake/alert Behavior During Therapy: WFL for tasks assessed/performed Overall Cognitive Status: Within Functional Limits for tasks assessed                                        Exercises Total Joint Exercises Ankle Circles/Pumps: AROM;Both;15 reps;Seated Gluteal Sets: AROM;Both;Seated Knee Flexion: AROM;Right;10 reps;Seated    General Comments        Pertinent Vitals/Pain Pain Assessment: 0-10 Pain Score: 6  Pain Location: 6/10 w/ rest, 9/10 amb Pain Descriptors / Indicators: Other (Comment) (tight) Pain Intervention(s): Limited activity within patient's tolerance;Monitored during session;Repositioned;Premedicated before session;Ice applied    Home Living  Prior Function            PT Goals (current goals can now be found in the care plan section) Acute Rehab PT Goals Patient Stated Goal: Reduce pain and increase mobility PT Goal Formulation: With patient Time For Goal Achievement: 02/19/21 Potential to Achieve Goals: Good Progress towards PT goals: Progressing toward goals    Frequency    BID      PT Plan Current plan remains appropriate    Co-evaluation              AM-PAC PT "6 Clicks" Mobility   Outcome Measure  Help  needed turning from your back to your side while in a flat bed without using bedrails?: A Little Help needed moving from lying on your back to sitting on the side of a flat bed without using bedrails?: A Little Help needed moving to and from a bed to a chair (including a wheelchair)?: A Little Help needed standing up from a chair using your arms (e.g., wheelchair or bedside chair)?: A Little Help needed to walk in hospital room?: A Little Help needed climbing 3-5 steps with a railing? : A Lot 6 Click Score: 17    End of Session Equipment Utilized During Treatment: Gait belt Activity Tolerance: Patient tolerated treatment well Patient left: with call bell/phone within reach;with SCD's reapplied;in chair;with chair alarm set   PT Visit Diagnosis: Muscle weakness (generalized) (M62.81);Difficulty in walking, not elsewhere classified (R26.2);Other abnormalities of gait and mobility (R26.89);Pain Pain - Right/Left: Right Pain - part of body: Knee     Time: 5643-3295 PT Time Calculation (min) (ACUTE ONLY): 32 min  Charges:                       [employer], SPT

## 2021-02-07 NOTE — Progress Notes (Addendum)
Physical Therapy Treatment Patient Details Name: Christine Higgins MRN: [id number] DOB: Feb 09, 1956 Today's Date: 02/07/2021    History of Present Illness Pt s/p R TKA 02/05/21 WBAT; PMH includes Carpal tunnel syndrome, COPD, Depression, Fibromyalgia, GERD, Graves disease, Chronic pain syndrome, DM II, Cervical disc disease - see chart for full PMH    PT Comments    Pt was found in the restroom without supervision upon PT entry. Pt education provided to pt on notifying nursing with mobilization for fall prevention. Pt verbalized understanding with education, RN notified of pt mobility prior to session.   Pt ambulated w/ a RW and min-guard another 190 ft with verbal cues for weight shifting and developing a step-through pattern. RLE weight acceptance was encouraged to mitigate abnormal gait patterns during recovery. Several pt led seated rest breaks, but able to continue ambulating. PT to focus on increasing mobility and strength while continuing to education on safety.     Follow Up Recommendations  Follow surgeon's recommendation for DC plan and follow-up therapies     Equipment Recommendations  Other (comment) (determined at next avenue of care)    Recommendations for Other Services       Precautions / Restrictions Precautions Precautions: Knee Precaution Comments: indep with SLR, no KI required Restrictions Weight Bearing Restrictions: Yes RLE Weight Bearing: Weight bearing as tolerated Other Position/Activity Restrictions: Keep knee extended    Mobility  Bed Mobility Overal bed mobility: Needs Assistance Bed Mobility: Sit to Supine       Sit to supine: Min guard   General bed mobility comments: Min-guard for safety    Transfers Overall transfer level: Needs assistance Equipment used: Rolling walker (2 wheeled) Transfers: Sit to/from Stand Sit to Stand: Min guard            Ambulation/Gait Ambulation/Gait assistance: Min guard Gait Distance (Feet): 190  Feet Assistive device: Rolling walker (2 wheeled)       General Gait Details: rest breaks due to pain, improved weight bearing   Stairs             Wheelchair Mobility    Modified Rankin (Stroke Patients Only)       Balance Overall balance assessment: Needs assistance Sitting-balance support: Feet supported;Single extremity supported Sitting balance-Leahy Scale: Good     Standing balance support: During functional activity;Bilateral upper extremity supported Standing balance-Leahy Scale: Fair                              Cognition Arousal/Alertness: Awake/alert Behavior During Therapy: WFL for tasks assessed/performed Overall Cognitive Status: Within Functional Limits for tasks assessed                                 General Comments: self-directive with mobility      Exercises Total Joint Exercises Short Arc Quad: AROM;Right;10 reps;Seated Long Arc Quad: AROM;Right;5 reps;Seated Knee Flexion: AROM;Right;10 reps;Seated    General Comments        Pertinent Vitals/Pain Pain Assessment: 0-10 Pain Score: 9  Pain Location: 9 w/ activity Pain Descriptors / Indicators: Moaning;Sore Pain Intervention(s): Limited activity within patient's tolerance;Monitored during session;Repositioned;Ice applied    Home Living Family/patient expects to be discharged to:: Assisted living Living Arrangements: Other (Comment) (ALF) Available Help at Discharge: Family;Friend(s) Type of Home: Assisted living Home Access: Level entry            Prior Function  PT Goals (current goals can now be found in the care plan section) Acute Rehab PT Goals Patient Stated Goal: Reduce pain and increase mobility PT Goal Formulation: With patient Time For Goal Achievement: 02/19/21 Potential to Achieve Goals: Good Progress towards PT goals: Progressing toward goals    Frequency    BID      PT Plan Current plan remains appropriate     Co-evaluation              AM-PAC PT "6 Clicks" Mobility   Outcome Measure  Help needed turning from your back to your side while in a flat bed without using bedrails?: A Little Help needed moving from lying on your back to sitting on the side of a flat bed without using bedrails?: A Little Help needed moving to and from a bed to a chair (including a wheelchair)?: A Little Help needed standing up from a chair using your arms (e.g., wheelchair or bedside chair)?: A Little Help needed to walk in hospital room?: A Lot Help needed climbing 3-5 steps with a railing? : A Lot 6 Click Score: 16    End of Session Equipment Utilized During Treatment: Gait belt Activity Tolerance: Patient tolerated treatment well Patient left: with call bell/phone within reach;with SCD's reapplied;in bed;with bed alarm set   PT Visit Diagnosis: Muscle weakness (generalized) (M62.81);Difficulty in walking, not elsewhere classified (R26.2);Other abnormalities of gait and mobility (R26.89);Pain Pain - Right/Left: Right Pain - part of body: Knee     Time: 6948-5462 PT Time Calculation (min) (ACUTE ONLY): 34 min  Charges:  $Gait Training: 8-22 mins $Therapeutic Exercise: 8-22 mins                     [employer], SPT

## 2021-02-07 NOTE — Progress Notes (Signed)
  Subjective: 2 Days Post-Op Procedure(s) (LRB): COMPUTER ASSISTED TOTAL KNEE ARTHROPLASTY (Right) Patient reports pain as moderate.   Patient is well, but has had some minor complaints of chest pain with deep breaths and shortness of breath last night that has resolved Plan is to go Skilled nursing facility after hospital stay. Negative for chest pain and shortness of breath Fever: no Gastrointestinal: negative for nausea and vomiting.  Patient has not had a bowel movement.  Objective: Vital signs in last 24 hours: Temp:  [97.5 F (36.4 C)-98.4 F (36.9 C)] 97.5 F (36.4 C) (06/15 0755) Pulse Rate:  [75-90] 79 (06/15 0755) Resp:  [16-18] 17 (06/15 0755) BP: (101-149)/(52-76) 114/68 (06/15 0755) SpO2:  [95 %-100 %] 100 % (06/15 0755)  Intake/Output from previous day:  Intake/Output Summary (Last 24 hours) at 02/07/2021 0808 Last data filed at 02/06/2021 2036 Gross per 24 hour  Intake 720 ml  Output 841 ml  Net -121 ml    Intake/Output this shift: No intake/output data recorded.  Labs: Recent Labs    02/05/21 0654  HGB 13.6   Recent Labs    02/05/21 0654  HCT 40.0   Recent Labs    02/05/21 0654  NA 137  K 3.5  CL 98  BUN 9  CREATININE 0.60  GLUCOSE 128*   No results for input(s): LABPT, INR in the last 72 hours.   EXAM General - Patient is Alert, Appropriate, and Oriented Extremity - Neurovascular intact Dorsiflexion/Plantar flexion intact Compartment soft Dressing/Incision -Postoperative dressing remains in place., Polar Care in place and working. , Hemovac in place. , Following removal of post-op dressing, significant sanguinous drainage noted on honeycomb Motor Function - intact, moving foot and toes well on exam.    Cardiovascular- Regular rate and rhythm, no murmurs/rubs/gallops Respiratory- Lungs clear to auscultation bilaterally Gastrointestinal- soft, mildly tender to palpation in the RLQ, this is her baseline  Assessment/Plan: 2 Days Post-Op  Procedure(s) (LRB): COMPUTER ASSISTED TOTAL KNEE ARTHROPLASTY (Right) Active Problems:   Total knee replacement status  Estimated body mass index is 35.01 kg/m as calculated from the following:   Height as of this encounter: 5\' 11"  (1.803 m).   Weight as of this encounter: 113.9 kg. Advance diet Up with therapy Discharge to SNF    Post-op dressing removed. , Hemovac removed., Mini compression dressing applied. , and Fresh honeycomb dressing applied.   DVT Prophylaxis - Lovenox, Ted hose, and foot pumps Weight-Bearing as tolerated to right leg  Cassell Smiles, PA-C [facility] Orthopaedic Surgery 02/07/2021, 8:08 AM

## 2021-02-07 NOTE — TOC Progression Note (Signed)
Transition of Care [facility]) - Progression Note    Patient Details  Name: Christine Higgins MRN: [id number] Date of Birth: [birth year]  Transition of Care [facility]) CM/SW [place], RN Phone Number: 02/07/2021, 10:23 AM  Clinical Narrative:     [facility] able [facility] make [facility] bed offer, the patient is accepting the bed and they are starting [employer] ins auth       Expected Discharge Plan and Services                                                 Social Determinants of Health (SDOH) Interventions    Readmission Risk Interventions No flowsheet data found.

## 2021-02-08 ENCOUNTER — Ambulatory Visit: Payer: Medicare HMO | Admitting: Dermatology

## 2021-02-08 LAB — SARS CORONAVIRUS 2 (TAT 6-24 HRS): SARS Coronavirus 2: NEGATIVE

## 2021-02-08 NOTE — TOC Progression Note (Addendum)
Transition of Care [facility]) - Progression Note    Patient Details  Name: Christine Higgins MRN: [id number] Date of Birth: March 05, 1956  Transition of Care [facility]) CM/SW [place], RN Phone Number: 02/08/2021, 11:23 AM  Clinical Narrative:      Spoke with the patient and explained that a peer to peer was offered, I also explained that the insurance could potentially deny her to go to SNF, She stated that she lives at the [facility] assisted living and could get PT there if needed, she also would need to get a RW to take home, she has a raised toilet seat at the [facility].   Faxed Home health orders to the [facility] at [phone], [employer] will bring a rolling walker into the room for the patient to take home    Expected Discharge Plan and Services           Expected Discharge Date: 02/08/21                                     Social Determinants of Health (SDOH) Interventions    Readmission Risk Interventions No flowsheet data found.

## 2021-02-08 NOTE — Care Management Important Message (Signed)
Important Message  Patient Details  Name: Christine Higgins MRN: [id number] Date of Birth: [birth year]   Medicare Important Message Given:  N/A - LOS <3 / Initial given by admissions     [name] 02/08/2021, 9:39 AM

## 2021-02-08 NOTE — Progress Notes (Signed)
[facility] pt from [facility] to the [facility].

## 2021-02-08 NOTE — Progress Notes (Signed)
Physical Therapy Treatment Patient Details Name: Christine Higgins MRN: [id number] DOB: [birth year] Today's Date: 02/08/2021    History of Present Illness Pt s/p R TKA 02/05/21 WBAT; PMH includes Carpal tunnel syndrome, COPD, Depression, Fibromyalgia, GERD, Graves disease, Chronic pain syndrome, DM II, Cervical disc disease - see chart for full PMH    PT Comments    Pt alert and willing to work with PT. Ambulated 210 ft w/ RW and min-guard for supervision. The number of rest breaks were decreased from previous session w/ one, 20 second, standing break during today's treatment. Pt demonstrated increased weight bearing on RLE with verbal prompts. Gait speed and step length remain decreased due to pain despite tactile and verbal cues. Transfers require RW, min-guard particularly during sit<>stand to/from lowered surfaces (toilet, bed.) Decreased mobility, strength, and standing balance remain, and pt would benefit from skilled PT.    Follow Up Recommendations  Follow surgeon's recommendation for DC plan and follow-up therapies     Equipment Recommendations  Other (comment) (TBD at next venue)    Recommendations for Other Services       Precautions / Restrictions Precautions Precautions: Knee Precaution Comments: indep with SLR Restrictions Weight Bearing Restrictions: Yes RLE Weight Bearing: Weight bearing as tolerated Other Position/Activity Restrictions: Keep knee extended    Mobility  Bed Mobility Overal bed mobility: Needs Assistance Bed Mobility: Sit to Supine       Sit to supine: Supervision        Transfers Overall transfer level: Needs assistance Equipment used: Rolling walker (2 wheeled) Transfers: Sit to/from Stand Sit to Stand: Min guard         General transfer comment: Sit > stand w/  R knee extended, encouraged increased weight bearing w/ movement  Ambulation/Gait Ambulation/Gait assistance: Min guard Gait Distance (Feet): 210 Feet Assistive device:  Rolling walker (2 wheeled)       General Gait Details: 1 standing rest break with   Stairs             Wheelchair Mobility    Modified Rankin (Stroke Patients Only)       Balance Overall balance assessment: Needs assistance Sitting-balance support: Feet supported;No upper extremity supported Sitting balance-Leahy Scale: Good Sitting balance - Comments: Dynamic reaching and pulling w/ unilateral UE support   Standing balance support: During functional activity;Bilateral upper extremity supported Standing balance-Leahy Scale: Fair                              Cognition Arousal/Alertness: Awake/alert Behavior During Therapy: WFL for tasks assessed/performed Overall Cognitive Status: Within Functional Limits for tasks assessed                                 General Comments: self-directive with mobility      Exercises Other Exercises Other Exercises: Bathroom to room w/ min-guard & RW,  supervision for pericare w/ hand rail & RW for support    General Comments        Pertinent Vitals/Pain Pain Assessment: 0-10 Pain Score: 9  Pain Location: 9.5 w/ activity Pain Descriptors / Indicators: Discomfort Pain Intervention(s): Limited activity within patient's tolerance;Monitored during session;Premedicated before session;Repositioned;Ice applied    Home Living                      Prior Function  PT Goals (current goals can now be found in the care plan section) Acute Rehab PT Goals Patient Stated Goal: Reduce pain and increase mobility PT Goal Formulation: With patient Time For Goal Achievement: 02/19/21 Potential to Achieve Goals: Good Progress towards PT goals: Progressing toward goals    Frequency    BID      PT Plan Current plan remains appropriate    Co-evaluation              AM-PAC PT "6 Clicks" Mobility   Outcome Measure  Help needed turning from your back to your side while in a flat  bed without using bedrails?: A Little Help needed moving from lying on your back to sitting on the side of a flat bed without using bedrails?: A Little Help needed moving to and from a bed to a chair (including a wheelchair)?: A Little Help needed standing up from a chair using your arms (e.g., wheelchair or bedside chair)?: A Little Help needed to walk in hospital room?: A Lot Help needed climbing 3-5 steps with a railing? : A Lot 6 Click Score: 16    End of Session Equipment Utilized During Treatment: Gait belt Activity Tolerance: Patient tolerated treatment well Patient left: with call bell/phone within reach;with SCD's reapplied;in bed;with bed alarm set   PT Visit Diagnosis: Muscle weakness (generalized) (M62.81);Difficulty in walking, not elsewhere classified (R26.2);Other abnormalities of gait and mobility (R26.89);Pain Pain - Right/Left: Right Pain - part of body: Knee     Time: 1040-1108 PT Time Calculation (min) (ACUTE ONLY): 28 min  Charges:                        [employer], SPT

## 2021-02-08 NOTE — Progress Notes (Signed)
  Subjective: 3 Days Post-Op Procedure(s) (LRB): COMPUTER ASSISTED TOTAL KNEE ARTHROPLASTY (Right) Patient reports pain as well-controlled.   Patient is well, and has had no acute complaints or problems Plan is to go to SNF after hospital stay. Negative for chest pain and shortness of breath Fever: no Gastrointestinal: negative for nausea and vomiting.  Patient has had a bowel movement.  Objective: Vital signs in last 24 hours: Temp:  [97.8 F (36.6 C)-98.5 F (36.9 C)] 98.2 F (36.8 C) (06/16 0431) Pulse Rate:  [76-96] 79 (06/16 0431) Resp:  [16-20] 18 (06/16 0431) BP: (111-155)/(64-79) 130/64 (06/16 0431) SpO2:  [91 %-100 %] 99 % (06/16 0431)  Intake/Output from previous day:  Intake/Output Summary (Last 24 hours) at 02/08/2021 0825 Last data filed at 02/07/2021 1021 Gross per 24 hour  Intake 240 ml  Output --  Net 240 ml    Intake/Output this shift: No intake/output data recorded.  Labs: Recent Labs    02/07/21 0822  HGB 8.1*   Recent Labs    02/07/21 0822  WBC 5.5  RBC 2.89*  HCT 24.0*  PLT 140*   No results for input(s): NA, K, CL, CO2, BUN, CREATININE, GLUCOSE, CALCIUM in the last 72 hours. No results for input(s): LABPT, INR in the last 72 hours.   EXAM General - Patient is Alert, Appropriate, and Oriented Extremity - Neurovascular intact Dorsiflexion/Plantar flexion intact Compartment soft Dressing/Incision -clean, dry, mild bloody drainage on honeycomb Motor Function - intact, moving foot and toes well on exam. Able to perform independent SLR.  Cardiovascular- Regular rate and rhythm, no murmurs/rubs/gallops Respiratory- Lungs clear to auscultation bilaterally Gastrointestinal- soft, nontender, and active bowel sounds   Assessment/Plan: 3 Days Post-Op Procedure(s) (LRB): COMPUTER ASSISTED TOTAL KNEE ARTHROPLASTY (Right) Active Problems:   Total knee replacement status  Estimated body mass index is 35.01 kg/m as calculated from the  following:   Height as of this encounter: 5\' 11"  (1.803 m).   Weight as of this encounter: 113.9 kg. Advance diet Up with therapy  Likely plan is for d/c later today pending insurance approval. Covid test negative.     DVT Prophylaxis - Lovenox, Ted hose, and foot pumps Weight-Bearing as tolerated to right leg  Cassell Smiles, PA-C [facility] Orthopaedic Surgery 02/08/2021, 8:25 AM

## 2021-02-08 NOTE — Progress Notes (Signed)
   02/08/21 1020  Clinical Encounter Type  Visited With Patient  Visit Type Initial  Referral From Chaplain  Consult/Referral To [facility] visited room 1A-145A Pt, Christine Higgins. Christine Higgins is an 65 year old. female who was admitted 02/05/2021 with a diagnosis of right knee osteoarthritis and went to the operating room on 02/05/2021 and underwent right total knee arthroplasty. She received perioperative antibiotics for prophylaxis. She tolerated the procedure well and was transported to PACU in stable condition. After meeting PACU criteria, she was transferred to the Orthopaedics/Rehabilitation unit. After our conversation about our surgerys and engaging a little about our lives, Christine Higgins offered to prayer for me and she reached out her hands and I extended mine and she began to pray. She stated, she sensed my spirit and she could tell I was a good person when I walked in the door. I thanked her for the prayer, I said I have been here since January of this year and you are the first pt that has ever asked to pray for me. I provided a ministry of presence, emotional and spiritual support and prayer at the end of the visit.

## 2021-02-08 NOTE — Progress Notes (Signed)
Christine Higgins to be D/C'd to [facility] per MD order.  Discussed prescriptions and follow up appointments with the patient. Prescriptions given to patient, medication list explained in detail. Pt verbalized understanding.    Vitals:   02/08/21 0847 02/08/21 1122  BP: (!) 133/59 (!) 123/58  Pulse: 98 76  Resp: 16 16  Temp: 99.2 F (37.3 C) 98.5 F (36.9 C)  SpO2: 95% 99%     IV catheter discontinued intact. Site without signs and symptoms of complications. Dressing and pressure applied. Pt denies pain at this time. No complaints noted.  An After Visit Summary was printed and given to the patient. [facility] will pick pt up at 2 pm.   Rolley Sims

## 2021-02-08 NOTE — TOC Transition Note (Signed)
Transition of Care [facility]) - CM/SW Discharge Note   Patient Details  Name: Christine Higgins MRN: [id number] Date of Birth: 08/28/1955  Transition of Care [facility]) CM/SW Contact:  Su Hilt, RN Phone Number: 02/08/2021, 10:40 AM   Clinical Narrative:      Received notification that the insurance is offering a peer to peer to be done no later than 5 PM, notified the physician and provided the number to call       Patient Goals and CMS Choice        Discharge Placement                       Discharge Plan and Services                                     Social Determinants of Health (SDOH) Interventions     Readmission Risk Interventions No flowsheet data found.

## 2021-02-11 DIAGNOSIS — Z96651 Presence of right artificial knee joint: Secondary | ICD-10-CM | POA: Diagnosis not present

## 2021-02-11 DIAGNOSIS — Z471 Aftercare following joint replacement surgery: Secondary | ICD-10-CM | POA: Diagnosis not present

## 2021-02-11 DIAGNOSIS — E119 Type 2 diabetes mellitus without complications: Secondary | ICD-10-CM | POA: Diagnosis not present

## 2021-02-11 DIAGNOSIS — F418 Other specified anxiety disorders: Secondary | ICD-10-CM | POA: Diagnosis not present

## 2021-02-11 DIAGNOSIS — I10 Essential (primary) hypertension: Secondary | ICD-10-CM | POA: Diagnosis not present

## 2021-02-11 DIAGNOSIS — R262 Difficulty in walking, not elsewhere classified: Secondary | ICD-10-CM | POA: Diagnosis not present

## 2021-02-11 DIAGNOSIS — G894 Chronic pain syndrome: Secondary | ICD-10-CM | POA: Diagnosis not present

## 2021-02-12 ENCOUNTER — Other Ambulatory Visit: Payer: Self-pay | Admitting: Dermatology

## 2021-02-12 DIAGNOSIS — R21 Rash and other nonspecific skin eruption: Secondary | ICD-10-CM

## 2021-02-13 DIAGNOSIS — Z96651 Presence of right artificial knee joint: Secondary | ICD-10-CM | POA: Diagnosis not present

## 2021-02-13 DIAGNOSIS — E119 Type 2 diabetes mellitus without complications: Secondary | ICD-10-CM | POA: Diagnosis not present

## 2021-02-13 DIAGNOSIS — R262 Difficulty in walking, not elsewhere classified: Secondary | ICD-10-CM | POA: Diagnosis not present

## 2021-02-13 DIAGNOSIS — G894 Chronic pain syndrome: Secondary | ICD-10-CM | POA: Diagnosis not present

## 2021-02-13 DIAGNOSIS — F418 Other specified anxiety disorders: Secondary | ICD-10-CM | POA: Diagnosis not present

## 2021-02-13 DIAGNOSIS — Z471 Aftercare following joint replacement surgery: Secondary | ICD-10-CM | POA: Diagnosis not present

## 2021-02-13 DIAGNOSIS — I10 Essential (primary) hypertension: Secondary | ICD-10-CM | POA: Diagnosis not present

## 2021-02-14 ENCOUNTER — Telehealth: Payer: Self-pay | Admitting: Student in an Organized Health Care Education/Training Program

## 2021-02-14 ENCOUNTER — Encounter: Payer: Self-pay | Admitting: *Deleted

## 2021-02-14 ENCOUNTER — Other Ambulatory Visit: Payer: Self-pay

## 2021-02-14 DIAGNOSIS — I10 Essential (primary) hypertension: Secondary | ICD-10-CM | POA: Diagnosis not present

## 2021-02-14 DIAGNOSIS — M797 Fibromyalgia: Secondary | ICD-10-CM

## 2021-02-14 DIAGNOSIS — X58XXXA Exposure to other specified factors, initial encounter: Secondary | ICD-10-CM | POA: Diagnosis not present

## 2021-02-14 DIAGNOSIS — R350 Frequency of micturition: Secondary | ICD-10-CM | POA: Diagnosis not present

## 2021-02-14 DIAGNOSIS — S80211A Abrasion, right knee, initial encounter: Secondary | ICD-10-CM | POA: Insufficient documentation

## 2021-02-14 DIAGNOSIS — E1169 Type 2 diabetes mellitus with other specified complication: Secondary | ICD-10-CM | POA: Diagnosis not present

## 2021-02-14 DIAGNOSIS — R262 Difficulty in walking, not elsewhere classified: Secondary | ICD-10-CM | POA: Diagnosis not present

## 2021-02-14 DIAGNOSIS — Z96653 Presence of artificial knee joint, bilateral: Secondary | ICD-10-CM | POA: Insufficient documentation

## 2021-02-14 DIAGNOSIS — R0600 Dyspnea, unspecified: Secondary | ICD-10-CM | POA: Diagnosis not present

## 2021-02-14 DIAGNOSIS — M25561 Pain in right knee: Secondary | ICD-10-CM | POA: Diagnosis not present

## 2021-02-14 DIAGNOSIS — R300 Dysuria: Secondary | ICD-10-CM | POA: Insufficient documentation

## 2021-02-14 DIAGNOSIS — Z87891 Personal history of nicotine dependence: Secondary | ICD-10-CM | POA: Insufficient documentation

## 2021-02-14 DIAGNOSIS — E785 Hyperlipidemia, unspecified: Secondary | ICD-10-CM | POA: Insufficient documentation

## 2021-02-14 DIAGNOSIS — S8011XA Contusion of right lower leg, initial encounter: Secondary | ICD-10-CM | POA: Insufficient documentation

## 2021-02-14 DIAGNOSIS — I2510 Atherosclerotic heart disease of native coronary artery without angina pectoris: Secondary | ICD-10-CM | POA: Diagnosis not present

## 2021-02-14 DIAGNOSIS — R5381 Other malaise: Secondary | ICD-10-CM | POA: Diagnosis not present

## 2021-02-14 DIAGNOSIS — Z96642 Presence of left artificial hip joint: Secondary | ICD-10-CM | POA: Diagnosis not present

## 2021-02-14 DIAGNOSIS — Z79899 Other long term (current) drug therapy: Secondary | ICD-10-CM | POA: Diagnosis not present

## 2021-02-14 DIAGNOSIS — G894 Chronic pain syndrome: Secondary | ICD-10-CM | POA: Diagnosis not present

## 2021-02-14 DIAGNOSIS — R918 Other nonspecific abnormal finding of lung field: Secondary | ICD-10-CM | POA: Diagnosis not present

## 2021-02-14 DIAGNOSIS — G8918 Other acute postprocedural pain: Secondary | ICD-10-CM | POA: Insufficient documentation

## 2021-02-14 DIAGNOSIS — J449 Chronic obstructive pulmonary disease, unspecified: Secondary | ICD-10-CM | POA: Insufficient documentation

## 2021-02-14 DIAGNOSIS — J45909 Unspecified asthma, uncomplicated: Secondary | ICD-10-CM | POA: Insufficient documentation

## 2021-02-14 DIAGNOSIS — R32 Unspecified urinary incontinence: Secondary | ICD-10-CM | POA: Insufficient documentation

## 2021-02-14 DIAGNOSIS — E1139 Type 2 diabetes mellitus with other diabetic ophthalmic complication: Secondary | ICD-10-CM | POA: Insufficient documentation

## 2021-02-14 DIAGNOSIS — R079 Chest pain, unspecified: Secondary | ICD-10-CM | POA: Diagnosis not present

## 2021-02-14 DIAGNOSIS — Z96651 Presence of right artificial knee joint: Secondary | ICD-10-CM | POA: Diagnosis not present

## 2021-02-14 DIAGNOSIS — Z7951 Long term (current) use of inhaled steroids: Secondary | ICD-10-CM | POA: Insufficient documentation

## 2021-02-14 DIAGNOSIS — R600 Localized edema: Secondary | ICD-10-CM | POA: Diagnosis not present

## 2021-02-14 DIAGNOSIS — R609 Edema, unspecified: Secondary | ICD-10-CM | POA: Diagnosis not present

## 2021-02-14 DIAGNOSIS — S8991XA Unspecified injury of right lower leg, initial encounter: Secondary | ICD-10-CM | POA: Diagnosis present

## 2021-02-14 DIAGNOSIS — R0789 Other chest pain: Secondary | ICD-10-CM | POA: Diagnosis not present

## 2021-02-14 DIAGNOSIS — R52 Pain, unspecified: Secondary | ICD-10-CM | POA: Diagnosis not present

## 2021-02-14 DIAGNOSIS — M7989 Other specified soft tissue disorders: Secondary | ICD-10-CM | POA: Diagnosis not present

## 2021-02-14 DIAGNOSIS — F418 Other specified anxiety disorders: Secondary | ICD-10-CM | POA: Diagnosis not present

## 2021-02-14 DIAGNOSIS — M25461 Effusion, right knee: Secondary | ICD-10-CM | POA: Diagnosis not present

## 2021-02-14 DIAGNOSIS — Z471 Aftercare following joint replacement surgery: Secondary | ICD-10-CM | POA: Diagnosis not present

## 2021-02-14 DIAGNOSIS — E119 Type 2 diabetes mellitus without complications: Secondary | ICD-10-CM | POA: Diagnosis not present

## 2021-02-14 MED ORDER — METHOCARBAMOL 750 MG PO TABS: 750.0000 mg | ORAL_TABLET | Freq: Three times a day (TID) | ORAL | 2 refills | Status: DC | PRN

## 2021-02-14 NOTE — Telephone Encounter (Signed)
Patient is calling about methacarbamol refill that was supposed to be sent in with her tramadol? Please call patient. She insist that [name] writes this for her.

## 2021-02-14 NOTE — ED Triage Notes (Addendum)
Pt arrives via [facility] with reported from the [facility] at [place], she is there for rehab after a total knee replacement last week. She c/o ongoing pain in the need and unable to hold her urine. VSS enroute, dressing c/d/I.     Pt reports since yesterday she has had urinary incontinence and burning with urination. She is also still having pain in the right knee, taking tramadol and tylenol without relief. She says the swelling in her right leg is worse and increased bruising to the back of her leg

## 2021-02-15 ENCOUNTER — Emergency Department: Payer: Medicare HMO

## 2021-02-15 ENCOUNTER — Telehealth: Payer: Self-pay

## 2021-02-15 ENCOUNTER — Emergency Department
Admission: EM | Admit: 2021-02-15 | Discharge: 2021-02-15 | Disposition: A | Discharge status: Home / Self Care | Payer: Medicare HMO | Attending: Emergency Medicine | Admitting: Emergency Medicine

## 2021-02-15 DIAGNOSIS — R600 Localized edema: Secondary | ICD-10-CM | POA: Diagnosis not present

## 2021-02-15 DIAGNOSIS — G894 Chronic pain syndrome: Secondary | ICD-10-CM | POA: Diagnosis not present

## 2021-02-15 DIAGNOSIS — M25461 Effusion, right knee: Secondary | ICD-10-CM | POA: Diagnosis not present

## 2021-02-15 DIAGNOSIS — M25561 Pain in right knee: Secondary | ICD-10-CM | POA: Diagnosis not present

## 2021-02-15 DIAGNOSIS — R32 Unspecified urinary incontinence: Secondary | ICD-10-CM

## 2021-02-15 DIAGNOSIS — R0600 Dyspnea, unspecified: Secondary | ICD-10-CM | POA: Diagnosis not present

## 2021-02-15 DIAGNOSIS — E119 Type 2 diabetes mellitus without complications: Secondary | ICD-10-CM | POA: Diagnosis not present

## 2021-02-15 DIAGNOSIS — R079 Chest pain, unspecified: Secondary | ICD-10-CM | POA: Diagnosis not present

## 2021-02-15 DIAGNOSIS — M7989 Other specified soft tissue disorders: Secondary | ICD-10-CM | POA: Diagnosis not present

## 2021-02-15 DIAGNOSIS — Z96651 Presence of right artificial knee joint: Secondary | ICD-10-CM | POA: Diagnosis not present

## 2021-02-15 DIAGNOSIS — S80211A Abrasion, right knee, initial encounter: Secondary | ICD-10-CM | POA: Diagnosis not present

## 2021-02-15 DIAGNOSIS — R0789 Other chest pain: Secondary | ICD-10-CM | POA: Diagnosis not present

## 2021-02-15 DIAGNOSIS — R262 Difficulty in walking, not elsewhere classified: Secondary | ICD-10-CM | POA: Diagnosis not present

## 2021-02-15 DIAGNOSIS — I2510 Atherosclerotic heart disease of native coronary artery without angina pectoris: Secondary | ICD-10-CM | POA: Diagnosis not present

## 2021-02-15 DIAGNOSIS — F418 Other specified anxiety disorders: Secondary | ICD-10-CM | POA: Diagnosis not present

## 2021-02-15 DIAGNOSIS — G8918 Other acute postprocedural pain: Secondary | ICD-10-CM

## 2021-02-15 DIAGNOSIS — Z96642 Presence of left artificial hip joint: Secondary | ICD-10-CM | POA: Diagnosis not present

## 2021-02-15 DIAGNOSIS — Z471 Aftercare following joint replacement surgery: Secondary | ICD-10-CM | POA: Diagnosis not present

## 2021-02-15 DIAGNOSIS — I10 Essential (primary) hypertension: Secondary | ICD-10-CM | POA: Diagnosis not present

## 2021-02-15 DIAGNOSIS — R918 Other nonspecific abnormal finding of lung field: Secondary | ICD-10-CM | POA: Diagnosis not present

## 2021-02-15 LAB — URINALYSIS, COMPLETE (UACMP) WITH MICROSCOPIC
Bacteria, UA: NONE SEEN
Bilirubin Urine: NEGATIVE
Glucose, UA: NEGATIVE mg/dL
Hgb urine dipstick: NEGATIVE
Ketones, ur: NEGATIVE mg/dL
Leukocytes,Ua: NEGATIVE
Nitrite: NEGATIVE
Protein, ur: NEGATIVE mg/dL
Specific Gravity, Urine: 1.003 — ABNORMAL LOW (ref 1.005–1.030)
pH: 7 (ref 5.0–8.0)

## 2021-02-15 LAB — BASIC METABOLIC PANEL
Anion gap: 6 (ref 5–15)
BUN: 8 mg/dL (ref 8–23)
CO2: 28 mmol/L (ref 22–32)
Calcium: 9.2 mg/dL (ref 8.9–10.3)
Chloride: 99 mmol/L (ref 98–111)
Creatinine, Ser: 0.66 mg/dL (ref 0.44–1.00)
GFR, Estimated: >60 mL/min (ref >60)
Glucose, Bld: 134 mg/dL — ABNORMAL HIGH (ref 70–99)
Potassium: 3.4 mmol/L — ABNORMAL LOW (ref 3.5–5.1)
Sodium: 133 mmol/L — ABNORMAL LOW (ref 135–145)

## 2021-02-15 LAB — CBC
HCT: 24.9 % — ABNORMAL LOW (ref 36.0–46.0)
Hemoglobin: 7.9 g/dL — ABNORMAL LOW (ref 12.0–15.0)
MCH: 27.1 pg (ref 26.0–34.0)
MCHC: 31.7 g/dL (ref 30.0–36.0)
MCV: 85.3 fL (ref 80.0–100.0)
Platelets: 335 10*3/uL (ref 150–400)
RBC: 2.92 MIL/uL — ABNORMAL LOW (ref 3.87–5.11)
RDW: 15.1 % (ref 11.5–15.5)
WBC: 5.9 10*3/uL (ref 4.0–10.5)
nRBC: 0 % (ref 0.0–0.2)

## 2021-02-15 LAB — GROUP A STREP BY PCR: Group A Strep by PCR: NOT DETECTED

## 2021-02-15 LAB — TROPONIN I (HIGH SENSITIVITY)
Troponin I (High Sensitivity): 3 ng/L (ref <18)
Troponin I (High Sensitivity): 2 ng/L (ref <18)

## 2021-02-15 MED ORDER — OXYCODONE-ACETAMINOPHEN 5-325 MG PO TABS
1.0000 | ORAL_TABLET | Freq: Once | ORAL | Status: AC
  Administered 2021-02-15: 1 via ORAL
  Filled 2021-02-15: qty 1

## 2021-02-15 MED ORDER — IOHEXOL 350 MG/ML SOLN
100.0000 mL | Freq: Once | INTRAVENOUS | Status: AC | PRN
  Administered 2021-02-15: 100 mL via INTRAVENOUS

## 2021-02-15 NOTE — ED Notes (Signed)
Report given to Dorian, RN

## 2021-02-15 NOTE — Discharge Instructions (Addendum)
For the leg:  Continue elevation when lying down Continue SCDs Continue PT/OT  For the incontinence:  Try to regularly empty your bladder throughout the day Follow-up with your primary doctor - you may need referral to a Urologist  Your urine showed no signs of urinary infection or bleeding

## 2021-02-15 NOTE — ED Notes (Signed)
Pt using bedside commode at this time.

## 2021-02-15 NOTE — ED Notes (Signed)
ED Provider at bedside. 

## 2021-02-15 NOTE — ED Notes (Signed)
US at bedside

## 2021-02-15 NOTE — Progress Notes (Signed)
ORTHOPAEDICS PROGRESS NOTE  PATIENT NAME: Christine Higgins DOB: 06-04-1956  MRN: [id number]  Subjective: The patient is a 65 year old female who is now 10 days status post right total knee arthroplasty.  She had an uneventful postoperative course and received routine perioperative antibiotics for prophylaxis.  She was discharged with Lovenox for DVT prophylaxis to be continued for 14 days postop.  She apparently complained of increased swelling and pain to the right knee as well as some relative incontinence.  Incidentally she reported some chest pain with deep inspiration.  She was transported from the [facility] via EMS for further evaluation in the emergency department.  The patient states she has been using her TEDs stockings and also states that the Polar Care has been used.  However, she is without stockings and no cold therapy or Polar Care pad is in place this morning.  She denied any fevers or chills.  Objective: Vital signs in last 24 hours: Temp:  [99.2 F (37.3 C)] 99.2 F (37.3 C) (06/22 2351) Pulse Rate:  [74-89] 79 (06/23 0700) Resp:  [18-21] 20 (06/23 0630) BP: (120-166)/(71-91) 142/77 (06/23 0700) SpO2:  [94 %-100 %] 95 % (06/23 0700)  Intake/Output from previous day: No intake/output data recorded.  Recent Labs    02/15/21 0118  WBC 5.9  HGB 7.9*  HCT 24.9*  PLT 335  K 3.4*  CL 99  CO2 28  BUN 8  CREATININE 0.66  GLUCOSE 134*  CALCIUM 9.2    EXAM General: Well-developed well-nourished female seen in no apparent discomfort.  The patient was ambulating with full weightbearing to the right lower extremity without difficulty. Right lower extremity: Dried blood is noted on the anterior knee dressing.  Dressing was removed.  No erythema.  No gross drainage.  Moderate amount of soft tissue swelling along the leg.  Also of note is ecchymosis to the calf.  The patient is able to form independent straight leg raise.  Homans test is negative. Neurologic: Awake, alert, and  oriented.  Sensory and motor function are grossly intact.  Test: DG Chest 2 View  Result Date: 02/15/2021 CLINICAL DATA:  Chest pain, dyspnea EXAM: CHEST - 2 VIEW COMPARISON:  06/25/2019 FINDINGS: Lungs are well expanded, symmetric, and clear. No pneumothorax or pleural effusion. Cardiac size within normal limits. Pulmonary vascularity is normal. Interval placement of dorsal column stimulator leads with leads overlying T8-9. Osseous structures are age-appropriate. No acute bone abnormality. IMPRESSION: No active cardiopulmonary disease. Interval dorsal column stimulator lead placement. Electronically Signed   By: Fidela Salisbury MD   On: 02/15/2021 02:35   DG Knee 2 Views Right  Result Date: 02/15/2021 CLINICAL DATA:  Worsening leg swelling up to recent surgery. EXAM: RIGHT KNEE - 1-2 VIEW COMPARISON:  x-ray right knee 02/05/2021 FINDINGS: No cortical erosion or destruction. No acute displaced fracture or dislocation of the knee. Prior external fixation osseous changes noted. At least small knee joint effusion noted. Interval increase in subcutaneus soft tissue edema overlying a right knee total arthroplasty. No surgical hardware fracture or surrounding lucency. Skin staples are again noted. IMPRESSION: Interval increase in subcutaneus soft tissue edema and likely small joint effusion in a patient status post right knee arthroplasty. Findings concerning for infection. Electronically Signed   By: Iven Finn M.D.   On: 02/15/2021 05:18   CT Angio Chest PE W and/or Wo Contrast  Result Date: 02/15/2021 CLINICAL DATA:  High probability for pulmonary embolism. Swollen leg after right knee replacement EXAM: CT ANGIOGRAPHY CHEST WITH  CONTRAST TECHNIQUE: Multidetector CT imaging of the chest was performed using the standard protocol during bolus administration of intravenous contrast. Multiplanar CT image reconstructions and MIPs were obtained to evaluate the vascular anatomy. CONTRAST:  160mL OMNIPAQUE  IOHEXOL 350 MG/ML SOLN COMPARISON:  03/20/2016 FINDINGS: Cardiovascular: Satisfactory opacification of the pulmonary arteries to the segmental level. No evidence of pulmonary embolism. Normal heart size. No pericardial effusion. Coronary calcification. Mediastinum/Nodes: Negative for adenopathy or mass Lungs/Pleura: There is no edema, consolidation, effusion, or pneumothorax.Stable and benign 4 mm right upper lobe pulmonary nodule on 8:31. Upper Abdomen: Negative Musculoskeletal: Left glenohumeral arthroplasty. Generalized thoracic disc space narrowing and ridging. A lower thoracic dorsal column stimulator is present. Review of the MIP images confirms the above findings. IMPRESSION: Negative for pulmonary embolism or other acute finding. Electronically Signed   By: Monte Fantasia M.D.   On: 02/15/2021 05:08   US Venous Img Lower Unilateral Right  Result Date: 02/15/2021 CLINICAL DATA:  Worsening leg swelling. EXAM: RIGHT LOWER EXTREMITY VENOUS DOPPLER ULTRASOUND TECHNIQUE: Gray-scale sonography with compression, as well as color and duplex ultrasound, were performed to evaluate the deep venous system(s) from the level of the common femoral vein through the popliteal and proximal calf veins. COMPARISON:  None. FINDINGS: VENOUS Normal compressibility of the common femoral, superficial femoral, and popliteal veins, as well as the visualized calf veins. Visualized portions of profunda femoral vein and great saphenous vein unremarkable. No filling defects to suggest DVT on grayscale or color Doppler imaging. Doppler waveforms show normal direction of venous flow, normal respiratory plasticity and response to augmentation. Limited views of the contralateral common femoral vein are unremarkable. OTHER In the distal right thigh, superomedial to the patient's bandaging there is a complex fluid collection measuring 4.7 x 3 5 x 8.8 cm. There is a large vessel abutting the collection without substantial internal flow  within the collection. IMPRESSION: 1. No evidence of DVT. 2. In the distal right thigh, superomedial to the patient's bandaging there is a complex fluid collection measuring up to 8.8 cm, which is nonspecific but most likely represents a postoperative seroma/hematoma given the patient's recent surgery. Electronically Signed   By: Margaretha Sheffield MD   On: 02/15/2021 06:58    Assessment: Status post right total knee arthroplasty Right lower extremity swelling and possible hematoma/seroma  Plan: The incision was cleaned and a new dressing was applied. The patient was instructed to be conscientious with use of the bilateral TEDs stockings.  Polar Care is to be maintained consistently.  She is also instructed on elevation of the right lower extremity.  She was reassured that I did not see any clinical evidence of infection.  No evidence of DVT or PE on the studies obtained last night. She already has a follow-up appointment next week for staple removal.  Will reassess wound at that time.  Recie Cirrincione P. Holley Bouche M.D.

## 2021-02-15 NOTE — ED Provider Notes (Signed)
Patient seen and cleared by Dr. Marry Guan. Likely expected post-op swelling/hematoma. No signs of infection clinically. Pt is afebrile, HDS. Urine is unremarkable. Strength 5/5 bl LE, normal sensation, no signs of cauda equina or neurological etiology for her urinary sx. Will refer to PCP and Urology, d/c as per Dr. Kennith Gain recommendations.Duffy Bruce, MD 02/15/21 [phone]

## 2021-02-15 NOTE — ED Notes (Signed)
Assisted pt to restroom, she c/o shortness of breath and her chest feels tight. Vitals assessed, ekg complete

## 2021-02-15 NOTE — ED Notes (Signed)
Patient transported to CT 

## 2021-02-15 NOTE — ED Provider Notes (Addendum)
[facility] Emergency Department Provider Note ____________________________________________   Event Date/Time   First MD Initiated Contact with Patient 02/15/21 0256     (approximate)  I have reviewed the triage vital signs and the nursing notes.   HISTORY  Chief Complaint Urinary Incontinence and Knee Pain    HPI Christine Higgins is a 65 y.o. female with PMH as noted below currently 10 days status post right total knee arthroplasty who presents from rehab with multiple complaints.   1.  Knee: The patient reports worsened swelling and pain to the right knee in the last 2 days.  She has also noted increased bruising to the back of the right leg.  The swelling goes down to her foot which has never happened previously.  She denies any new injuries.  She states that she has been able to walk and bear weight on it during rehab.  She denies any weakness or numbness.   2.  Urinary symptoms: The patient reports urinary incontinence primarily when she stands up.  She has also had some dysuria and frequency but denies hematuria.  She has no lower abdominal pain, or any weakness or numbness to the pelvic area or lower extremities.   3.  Chest pain: The patient reports intermittent right-sided chest pain which is sharp in nature, nonradiating, and associated with exertional shortness of breath.   4.  Sore throat: The patient has had a sore throat for the last 4 days although she denies any difficulty swallowing.  She has no fever.    Past Medical History:  Diagnosis Date   Anxiety    Asthma    Breast mass    LEFT x 3 months per pt and palpated by physician   Cervical disc disease    Chronic pain syndrome    COPD (chronic obstructive pulmonary disease) (HCC)    Degenerative disc disease, lumbar    osteoarthritis   Depression    Fibromyalgia    Fibromyalgia    GERD (gastroesophageal reflux disease)    Glaucoma    Graves disease    Headache    migraines    Hemorrhoids    Hyperlipidemia    Hypertension    Hyperthyroidism    Lumbar disc disease    Obesity    Psoriatic arthritis ([place])    Thyroid disease     Patient Active Problem List   Diagnosis Date Noted   Total knee replacement status 02/05/2021   Piriformis syndrome of both sides 01/02/2021   Chronic pain 08/07/2020   Chronic midline thoracic back pain 03/27/2020   Psoriasis 12/22/2019   Psoriatic arthritis ([place]) 12/22/2019   Multiple thyroid nodules 10/01/2019   Urticaria, chronic 07/14/2019   History of colon polyps 07/09/2019   Status post reverse arthroplasty of shoulder, left 06/24/2019   History of lumbar laminectomy for spinal cord decompression (L3-L4, 2015) 02/25/2019   Lumbar radiculopathy 02/25/2019   Neutropenia ([place]) 02/16/2019   Primary osteoarthritis involving multiple joints 02/16/2019   Polyarthralgia 02/02/2019   Cervical spondylosis 09/14/2018   Tachycardia 08/18/2017   Status post total left knee replacement 08/11/2017   Chronic pain syndrome 07/30/2017   Fibromyalgia 07/30/2017   Pelvic pain in female 06/24/2017   Dysuria 06/24/2017   Generalized anxiety disorder 06/23/2017   Primary insomnia 06/23/2017   Lumbar spondylosis 06/23/2017   Stenosis of intervertebral foramina 06/23/2017   Right carpal tunnel syndrome 03/27/2017   HTN, goal below 140/90 03/05/2017   Graves' orbitopathy 02/17/2017   Primary  osteoarthritis of right knee 10/18/2016   Bilateral arm weakness 07/16/2016   Spinal stenosis of lumbar region 03/22/2016   Chronic low back pain 02/13/2016   Depression, major, in remission ([place]) 12/21/2015   Failed back surgical syndrome 10/30/2015   DDD (degenerative disc disease), lumbosacral 10/30/2015   Status post total replacement of right hip 08/21/2015   Leg pain 07/28/2015   Health care maintenance 06/21/2015   Impingement syndrome of shoulder, left 10/20/2014   Severe obesity (BMI 35.0-35.9 with comorbidity) ([place]) 08/09/2014    Atherosclerosis of abdominal aorta ([place]) 07/29/2014   DM II (diabetes mellitus, type II), controlled ([place]) 04/19/2014   Hemorrhoids 03/14/2014   Osteoporosis 02/03/2014   Migraines 02/03/2014   Hyperlipidemia, unspecified 02/03/2014   GERD (gastroesophageal reflux disease) 02/03/2014    Past Surgical History:  Procedure Laterality Date   BACK SURGERY     sumbar   CARDIAC CATHETERIZATION     carpel tunn Right    carpel tunnel Left    CESAREAN SECTION     COLONOSCOPY     COLONOSCOPY WITH PROPOFOL N/A 10/20/2020   Procedure: COLONOSCOPY WITH PROPOFOL;  Surgeon: Lesly Rubenstein, MD;  Location: [facility] ENDOSCOPY;  Service: Endoscopy;  Laterality: N/A;   ESOPHAGOGASTRODUODENOSCOPY (EGD) WITH PROPOFOL N/A 06/26/2017   Procedure: ESOPHAGOGASTRODUODENOSCOPY (EGD) WITH PROPOFOL;  Surgeon: Lollie Sails, MD;  Location: [facility] ENDOSCOPY;  Service: Endoscopy;  Laterality: N/A;   EXCISION MORTON'S NEUROMA Left 02/05/2017   Procedure: EXCISION MORTON'S NEUROMA;  Surgeon: Samara Deist, DPM;  Location: [place];  Service: Podiatry;  Laterality: Left;  iva with local   HAND SURGERY Right    scar tissue removal   JOINT REPLACEMENT     right hip arthroplasty 08/25/15   KNEE ARTHROPLASTY Left 08/11/2017   Procedure: COMPUTER ASSISTED TOTAL KNEE ARTHROPLASTY;  Surgeon: Dereck Leep, MD;  Location: [facility] ORS;  Service: Orthopedics;  Laterality: Left;   KNEE ARTHROPLASTY Right 02/05/2021   Procedure: COMPUTER ASSISTED TOTAL KNEE ARTHROPLASTY;  Surgeon: Dereck Leep, MD;  Location: [facility] ORS;  Service: Orthopedics;  Laterality: Right;   KNEE ARTHROSCOPY Right 07/02/2018   Procedure: ARTHROSCOPY KNEE, Medial and Lateral  Chondroplasty;  Surgeon: Dereck Leep, MD;  Location: [facility] ORS;  Service: Orthopedics;  Laterality: Right;   NECK SURGERY     "disk implant"   REVERSE SHOULDER ARTHROPLASTY Left 06/24/2019   Procedure: REVERSE SHOULDER ARTHROPLASTY;  Surgeon: Corky Mull, MD;   Location: [facility] ORS;  Service: Orthopedics;  Laterality: Left;   SHOULDER SURGERY Right    spur removal   THORACIC LAMINECTOMY FOR SPINAL CORD STIMULATOR N/A 08/07/2020   Procedure: THORACIC SPINAL CORD STIMULATOR VIA LAMINECTOMY, PULSE GENERATOR;  Surgeon: [name], MD;  Location: [facility] ORS;  Service: Neurosurgery;  Laterality: N/A;   TOTAL HIP ARTHROPLASTY Right    TUBAL LIGATION      Prior to Admission medications   Medication Sig Start Date End Date Taking? Authorizing Provider  acetaminophen (TYLENOL) 500 MG tablet Take 2 tablets (1,000 mg total) by mouth every 6 (six) hours. 08/08/20   Lonell Face, NP  Ascorbic Acid (VITAMIN C) 1000 MG tablet Take 1,000 mg by mouth daily.     [provider]  atorvastatin (LIPITOR) 10 MG tablet Take 10 mg by mouth daily after supper.     [provider]  Beta Carotene (VITAMIN A) 25000 UNIT capsule Take 25,000 Units by mouth 2 (two) times a week.    [provider]  budesonide-formoterol (SYMBICORT) 160-4.5 MCG/ACT  inhaler Inhale 2 puffs into the lungs 2 (two) times daily.    [provider]  calcium carbonate (OS-CAL) 600 MG TABS tablet Take 1 tablet by mouth 2 (two) times a week.     [provider]  Calcium-Magnesium-Vitamin D [phone] MG-MG-UNIT TABS Take 1 tablet by mouth daily.    [provider]  celecoxib (CELEBREX) 200 MG capsule Take 200 mg by mouth 2 (two) times daily.    [provider]  celecoxib (CELEBREX) 200 MG capsule Take 200 mg by mouth in the morning and at bedtime. 12/21/20   [provider]  Cholecalciferol (VITAMIN D3) 25 MCG (1000 UT) CAPS Take 2,000 Units by mouth every evening.     [provider]  ciclopirox (LOPROX) 0.77 % cream APPLY TOPICALLY AS NEEDED. TO ABDOMEN, GROIN AND BREASTS 11/14/20   Moye, [place], MD  clindamycin (CLEOCIN T) 1 % lotion Apply topically 2 (two) times daily. Patient not taking: No sig reported    [provider]  clobetasol ointment (TEMOVATE) 0.05 % CONTINUE TO MORE SEVERE AREAS OF RASH 1-2 TIMES DAILY FOR UP TO 2 WEEKS AT A TIME AS NEEDED FOR RASH. AVOID APPLYING TO FACE, GROIN, AND AXILLA. USE AS DIRECTED. RISK OF SKIN ATROPHY WITH LONG-TERM USE REVIEWED. Patient taking differently: Apply 1 application topically 2 (two) times daily as needed. Continue to more severe areas of rash 1-2 times daily for up to 2 weeks at a time as needed for rash. Avoid applying to face, groin, and axilla. Use as directed. Risk of skin atrophy with long-term use reviewed. 02/02/21   Moye, [place], MD  clonazePAM (KLONOPIN) 1 MG tablet Take 1 tablet (1 mg total) by mouth at bedtime as needed (for sleep/anxiety). 08/13/17   Toni Arthurs, NP  clotrimazole-betamethasone (LOTRISONE) cream Apply 1 application topically 2 (two) times daily as needed (irritation).    [provider]  Coenzyme Q10 (COQ-10) 200 MG CAPS Take 2 capsules by mouth every other day.     [provider]  Cyanocobalamin (VITAMIN B-12) 5000 MCG TBDP Take 5,000 mcg by mouth every other day.     [provider]  diphenhydrAMINE (BENADRYL) 25 MG tablet Take 25 mg by mouth at bedtime as needed.    [provider]  DULoxetine (CYMBALTA) 30 MG capsule Take 30 mg by mouth in the morning.     [provider]  DULoxetine (CYMBALTA) 60 MG capsule Take 60 mg by mouth every evening.     [provider]  enoxaparin (LOVENOX) 40 MG/0.4ML injection Inject 0.4 mLs (40 mg total) into the skin daily for 14 days. 02/07/21 02/21/21  Fausto Skillern, PA-C  esomeprazole (NEXIUM) 40 MG capsule Take 40 mg by mouth 2 (two) times daily.     [provider]  fexofenadine (ALLEGRA) 180 MG tablet Take 180 mg by mouth daily as needed.    [provider]  fluconazole (DIFLUCAN) 100 MG tablet Take 1 tablet by mouth daily as needed.    [provider]  Fluocinolone Acetonide Body 0.01 % OIL APPLY TO  SCALP TWICE DAILY AS NEEDED FOR ITCH. 12/18/20   Moye, [place], MD  fluticasone (FLONASE) 50 MCG/ACT nasal spray Place 1 spray into both nostrils daily.  10/11/19   [provider]  gabapentin (NEURONTIN) 300 MG capsule Take 1 capsule (300 mg total) by mouth 3 (three) times daily. 10/24/20   [name], MD  hydrALAZINE (APRESOLINE) 25 MG tablet Take 25 mg by mouth 3 (  three) times daily as needed (blood pressure 160/100 or higher).     [provider]  hydrOXYzine (ATARAX/VISTARIL) 10 MG tablet TAKE UP TO 3 TABLETS (30MG  TOTAL) BY MOUTH AT BEDTIME AS NEEDED FOR ITCHING 02/02/21   Moye, [place], MD  linaclotide (LINZESS) 290 MCG CAPS capsule Take 290 mcg by mouth daily as needed (constipation).     [provider]  losartan-hydrochlorothiazide (HYZAAR) 100-25 MG tablet Take 1 tablet by mouth daily.    [provider]  Menthol, Topical Analgesic, (ICY HOT EX) Apply 1 application topically 3 (three) times daily as needed (for pain.).    [provider]  methimazole (TAPAZOLE) 10 MG tablet Take 10 mg by mouth daily.     [provider]  methocarbamol (ROBAXIN) 750 MG tablet Take 1 tablet (750 mg total) by mouth every 8 (eight) hours as needed for muscle spasms. 02/14/21   [name], MD  Multiple Vitamin (ANTI-OXIDANT PO) Take by mouth every other day.    [provider]  Omega-3 Fatty Acids (FISH OIL) 1000 MG CAPS Take 1,000 mg by mouth 2 (two) times a week.    [provider]  oxyCODONE (OXY IR/ROXICODONE) 5 MG immediate release tablet Take 1 tablet (5 mg total) by mouth every 4 (four) hours as needed for moderate pain (pain score 4-6). 02/07/21   Fausto Skillern, PA-C  Probiotic Product (PROBIOTIC PO) Take 1 capsule by mouth every other day.     [provider]  propranolol (INDERAL) 40 MG tablet Take 40 mg by mouth 2 (two) times daily. 02/05/18   [provider]  Propylene Glycol 0.6 % SOLN Place 1 drop into  both eyes 2 (two) times daily.    [provider]  sucralfate (CARAFATE) 1 g tablet Take 1 g by mouth 2 (two) times daily.    [provider]  terbinafine (LAMISIL) 250 MG tablet Take one tablet daily for 7 days. Repeat at the beginning of months 3, 5, and 7 04/27/20   Moye, [place], MD  traMADol (ULTRAM) 50 MG tablet Take 2 tablets (100 mg total) by mouth every 12 (twelve) hours as needed for severe pain. 01/09/21 04/09/21  [name], MD  traZODone (DESYREL) 150 MG tablet Take 75 mg by mouth at bedtime as needed for sleep.     [provider]  triamcinolone ointment (KENALOG) 0.1 % Apply 1 application topically 2 (two) times daily. For 2 weeks. Avoid applying to face, groin, and axilla. Use as directed. Risk of skin atrophy with long-term use reviewed. Patient not taking: Reported on 01/25/2021 07/13/20   Laurence Ferrari, [place], MD  TURMERIC PO Take 1,000 mg by mouth every other day.     [provider]  VITAMIN E PO Take 400 Units by mouth every other day.     [provider]    Allergies Cephalexin, Ibuprofen, Potassium chloride, Shellfish allergy, and Aspirin  Family History  Problem Relation Age of Onset   Cancer Mother    Heart disease Father    Breast cancer Neg Hx     Social History Social History   Tobacco Use   Smoking status: Former    Pack years: 0.00    Types: Cigarettes    Quit date: 1994    Years since quitting: 28.4   Smokeless tobacco: Never  Vaping Use   Vaping Use: Never used  Substance Use Topics   Alcohol use: No    Alcohol/week: 0.0 standard drinks   Drug use:  Not Currently    Comment: 1991    Review of Systems  Constitutional: No fever. Eyes: No redness. ENT: Positive for sore throat. Cardiovascular: Positive for intermittent chest pain. Respiratory: Positive for exertional shortness of breath. Gastrointestinal: No vomiting or diarrhea. Genitourinary: Positive for dysuria and incontinence. Musculoskeletal:  Negative for back pain.  Positive for right knee pain. Skin: Negative for rash. Neurological: Negative for headaches, focal weakness or numbness.  ____________________________________________   PHYSICAL EXAM:  VITAL SIGNS: ED Triage Vitals [02/14/21 2351]  Enc Vitals Group     BP (!) 145/77     Pulse Rate 82     Resp 18     Temp 99.2 F (37.3 C)     Temp Source Oral     SpO2 99 %     Weight      Height      Head Circumference      Peak Flow      Pain Score 7     Pain Loc      Pain Edu?      Excl. in [place]?     Constitutional: Alert and oriented.  Relatively well appearing and in no acute distress. Eyes: Conjunctivae are normal. Head: Atraumatic. Nose: No congestion/rhinnorhea. Mouth/Throat: Mucous membranes are moist.  Oropharynx clear with no erythema or exudate. Neck: Normal range of motion. Cardiovascular: Normal rate, regular rhythm. Good peripheral circulation. Respiratory: Normal respiratory effort.  No retractions. Gastrointestinal: No distention. Musculoskeletal: 2+ right lower extremity edema below the knee.  Moderate swelling to the right knee.  Incision site with dressing clean, dry, intact.  Flexion and extension intact to the right knee.  2+ DP pulse.  Moderate subacute appearing ecchymosis to the posterior and lateral aspects of the right knee and lower leg.  Extremities otherwise warm and well perfused. Neurologic:  Normal speech and language.  Motor and sensory intact bilateral lower extremities. Skin:  Skin is warm and dry. No rash noted. Psychiatric: Mood and affect are normal. Speech and behavior are normal.  ____________________________________________   LABS (all labs ordered are listed, but only abnormal results are displayed)  Labs Reviewed  URINALYSIS, COMPLETE (UACMP) WITH MICROSCOPIC - Abnormal; Notable for the following components:      Result Value   Color, Urine STRAW (*)    APPearance CLEAR (*)    Specific Gravity, Urine 1.003 (*)     All other components within normal limits  BASIC METABOLIC PANEL - Abnormal; Notable for the following components:   Sodium 133 (*)    Potassium 3.4 (*)    Glucose, Bld 134 (*)    All other components within normal limits  CBC - Abnormal; Notable for the following components:   RBC 2.92 (*)    Hemoglobin 7.9 (*)    HCT 24.9 (*)    All other components within normal limits  GROUP A STREP BY PCR  TROPONIN I (HIGH SENSITIVITY)  TROPONIN I (HIGH SENSITIVITY)   ____________________________________________  EKG  ED ECG REPORT I, Arta Silence, the attending physician, personally viewed and interpreted this ECG.   Date: 02/15/2021 EKG Time: 0114 Rate: 79  Rhythm: normal sinus rhythm QRS Axis: normal Intervals: normal ST/T Wave abnormalities: normal Narrative Interpretation: no evidence of acute ischemia ____________________________________________  RADIOLOGY  Chest x-ray interpreted by me shows no focal consolidation or edema XR R knee: Increased soft tissue edema and effusion US venous RLE: Pending CT angio chest: No acute PE or other acute abnormality  ____________________________________________   PROCEDURES  Procedure(s) performed: No  Procedures  Critical Care performed: No ____________________________________________   INITIAL IMPRESSION / ASSESSMENT AND PLAN / ED COURSE  Pertinent labs & imaging results that were available during my care of the patient were reviewed by me and considered in my medical decision making (see chart for details).   65 year old female with PMH as noted above and 10 days status post right knee total arthroplasty presents with multiple complaints, specifically increased right knee pain and swelling, urinary incontinence, atypical chest pain, and sore throat.  1.  Right knee pain: Overall this is most consistent with increased swelling due to postoperative changes and increased activity at rehab.  There is no erythema or abnormal  warmth, and no evidence of septic joint or wound infection.  The bruising appears to be a normal postoperative finding.  We will obtain an x-ray and ultrasound to rule out DVT.  The patient has follow-up with orthopedics in several days.  2.  Urinary symptoms: The patient has some incontinence and dysuria.  Urinalysis is negative, so I do not suspect UTI.  Some of this may be due to deconditioning.  The patient has no neurologic deficits on exam to suggest spinal etiology.  We will obtain a bladder scan to rule out overflow incontinence.  If the symptoms continue she will need PMD follow-up.  3.  Chest pain/shortness of breath: This is atypical and intermittent in nature.  Troponin is negative, and the chest x-ray shows no acute abnormality.  However, given the patient's recent surgical history, she is at elevated risk for PE.  We will obtain a CT angio of the chest.  4.  Sore throat: Oropharynx appears clear on exam.  This could be postoperative due to anesthesia/intubation.  We will obtain a strep swab.  The lab work-up is also notable for anemia, however the patient's hemoglobin has not changed significantly since she left the hospital 8 days ago.  ----------------------------------------- 6:57 AM on 02/15/2021 -----------------------------------------  Lab work-up is unremarkable including negative troponins x2.  Strep swab is negative.  CT angio chest is negative for PE or other acute findings.  Etiology of the urinary incontinence is unclear but we have ruled out UTI or neurologic cause.  Bladder scan showed only 100 cc of urine so there is no evidence of retention or overflow incontinence.  I recommend that the patient follow-up with her PMD for further work-up of this symptom.  X-rays of the right knee show increased soft tissue edema concerning for infection, although this finding is nonspecific and the patient does not have fever or elevated WBC count is moving the knee relatively well.   I consulted Dr. Posey Pronto from orthopedics who advises that Dr. Marry Guan is in the hospital today and will come see the patient.  DVT study is also pending.  I have signed the patient out to the oncoming ED physician Dr. Ellender Hose.  ____________________________________________   FINAL CLINICAL IMPRESSION(S) / ED DIAGNOSES  Final diagnoses:  Postoperative pain of right knee  Atypical chest pain  Urinary incontinence, unspecified type      NEW MEDICATIONS STARTED DURING THIS VISIT:  New Prescriptions   No medications on file     Note:  This document was prepared using Dragon voice recognition software and may include unintentional dictation errors.      Arta Silence, MD 02/15/21 [phone]

## 2021-02-15 NOTE — Telephone Encounter (Signed)
Called patient about PP  , no anwser left message on machine to call us back. Saw that patient was in the ED

## 2021-02-16 ENCOUNTER — Telehealth: Payer: Self-pay

## 2021-02-16 DIAGNOSIS — Z471 Aftercare following joint replacement surgery: Secondary | ICD-10-CM | POA: Diagnosis not present

## 2021-02-16 DIAGNOSIS — F418 Other specified anxiety disorders: Secondary | ICD-10-CM | POA: Diagnosis not present

## 2021-02-16 DIAGNOSIS — R262 Difficulty in walking, not elsewhere classified: Secondary | ICD-10-CM | POA: Diagnosis not present

## 2021-02-16 DIAGNOSIS — Z96651 Presence of right artificial knee joint: Secondary | ICD-10-CM | POA: Diagnosis not present

## 2021-02-16 DIAGNOSIS — I10 Essential (primary) hypertension: Secondary | ICD-10-CM | POA: Diagnosis not present

## 2021-02-16 DIAGNOSIS — G894 Chronic pain syndrome: Secondary | ICD-10-CM | POA: Diagnosis not present

## 2021-02-16 DIAGNOSIS — E119 Type 2 diabetes mellitus without complications: Secondary | ICD-10-CM | POA: Diagnosis not present

## 2021-02-16 NOTE — Telephone Encounter (Signed)
Pt received a missed call of 02/15/21 and was returning it

## 2021-02-17 DIAGNOSIS — F418 Other specified anxiety disorders: Secondary | ICD-10-CM | POA: Diagnosis not present

## 2021-02-17 DIAGNOSIS — E119 Type 2 diabetes mellitus without complications: Secondary | ICD-10-CM | POA: Diagnosis not present

## 2021-02-17 DIAGNOSIS — Z471 Aftercare following joint replacement surgery: Secondary | ICD-10-CM | POA: Diagnosis not present

## 2021-02-17 DIAGNOSIS — G894 Chronic pain syndrome: Secondary | ICD-10-CM | POA: Diagnosis not present

## 2021-02-17 DIAGNOSIS — R262 Difficulty in walking, not elsewhere classified: Secondary | ICD-10-CM | POA: Diagnosis not present

## 2021-02-17 DIAGNOSIS — I10 Essential (primary) hypertension: Secondary | ICD-10-CM | POA: Diagnosis not present

## 2021-02-17 DIAGNOSIS — Z96651 Presence of right artificial knee joint: Secondary | ICD-10-CM | POA: Diagnosis not present

## 2021-02-18 DIAGNOSIS — F418 Other specified anxiety disorders: Secondary | ICD-10-CM | POA: Diagnosis not present

## 2021-02-18 DIAGNOSIS — Z471 Aftercare following joint replacement surgery: Secondary | ICD-10-CM | POA: Diagnosis not present

## 2021-02-18 DIAGNOSIS — Z96651 Presence of right artificial knee joint: Secondary | ICD-10-CM | POA: Diagnosis not present

## 2021-02-18 DIAGNOSIS — E119 Type 2 diabetes mellitus without complications: Secondary | ICD-10-CM | POA: Diagnosis not present

## 2021-02-18 DIAGNOSIS — I10 Essential (primary) hypertension: Secondary | ICD-10-CM | POA: Diagnosis not present

## 2021-02-18 DIAGNOSIS — G894 Chronic pain syndrome: Secondary | ICD-10-CM | POA: Diagnosis not present

## 2021-02-18 DIAGNOSIS — R262 Difficulty in walking, not elsewhere classified: Secondary | ICD-10-CM | POA: Diagnosis not present

## 2021-02-19 ENCOUNTER — Ambulatory Visit
Payer: Medicare HMO | Attending: Student in an Organized Health Care Education/Training Program | Admitting: Student in an Organized Health Care Education/Training Program

## 2021-02-19 ENCOUNTER — Encounter: Payer: Self-pay | Admitting: Student in an Organized Health Care Education/Training Program

## 2021-02-19 ENCOUNTER — Other Ambulatory Visit: Payer: Self-pay

## 2021-02-19 DIAGNOSIS — Z9889 Other specified postprocedural states: Secondary | ICD-10-CM

## 2021-02-19 DIAGNOSIS — G894 Chronic pain syndrome: Secondary | ICD-10-CM | POA: Diagnosis not present

## 2021-02-19 DIAGNOSIS — E119 Type 2 diabetes mellitus without complications: Secondary | ICD-10-CM | POA: Diagnosis not present

## 2021-02-19 DIAGNOSIS — I10 Essential (primary) hypertension: Secondary | ICD-10-CM | POA: Diagnosis not present

## 2021-02-19 DIAGNOSIS — Z471 Aftercare following joint replacement surgery: Secondary | ICD-10-CM | POA: Diagnosis not present

## 2021-02-19 DIAGNOSIS — M961 Postlaminectomy syndrome, not elsewhere classified: Secondary | ICD-10-CM | POA: Diagnosis not present

## 2021-02-19 DIAGNOSIS — M5416 Radiculopathy, lumbar region: Secondary | ICD-10-CM | POA: Diagnosis not present

## 2021-02-19 DIAGNOSIS — R262 Difficulty in walking, not elsewhere classified: Secondary | ICD-10-CM | POA: Diagnosis not present

## 2021-02-19 DIAGNOSIS — Z96651 Presence of right artificial knee joint: Secondary | ICD-10-CM | POA: Diagnosis not present

## 2021-02-19 DIAGNOSIS — M545 Low back pain, unspecified: Secondary | ICD-10-CM | POA: Diagnosis not present

## 2021-02-19 DIAGNOSIS — F418 Other specified anxiety disorders: Secondary | ICD-10-CM | POA: Diagnosis not present

## 2021-02-19 NOTE — Progress Notes (Signed)
Patient: Christine Higgins  Service Category: E/M  Provider: [name], MD  DOB: 1956-05-24  DOS: 02/19/2021  Location: Office  MRN: [id number]  Setting: Ambulatory outpatient  Referring Provider: Kirk Ruths, MD  Type: Established Patient  Specialty: Interventional Pain Management  PCP: Kirk Ruths, MD  Location: Remote location  Delivery: TeleHealth     Virtual Encounter - Pain Management PROVIDER NOTE: Information contained herein reflects review and annotations entered in association with encounter. Interpretation of such information and data should be left to medically-trained personnel. Information provided to patient can be located elsewhere in the medical record under "Patient Instructions". Document created using STT-dictation technology, any transcriptional errors that may result from process are unintentional.    Contact & Pharmacy Preferred: [phone] Home: [phone] (home) Mobile: [phone] (mobile) E-mail: [email or web address]  [id number] [name], [place] [address] [place] [address] Phone: [phone] Fax: [phone]   Pre-screening  Ms. Soldo offered "in-person" vs "virtual" encounter. She indicated preferring virtual for this encounter.   Reason COVID-19*  Social distancing based on CDC and AMA recommendations.   I contacted Christine Higgins on 02/19/2021 via video conference.      I clearly identified myself as [name], MD. I verified that I was speaking with the correct person using two identifiers (Name: PAISLEIGH MARONEY, and date of birth: 10/17/1955).  Consent I sought verbal advanced consent from Christine Higgins for virtual visit interactions. I informed Ms. Manalo of possible security and privacy concerns, risks, and limitations associated with providing "not-in-person" medical evaluation and management services. I also informed Ms. Maldonado of the availability of "in-person" appointments. Finally, I  informed her that there would be a charge for the virtual visit and that she could be  personally, fully or partially, financially responsible for it. Ms. Schier expressed understanding and agreed to proceed.   Historic Elements   Ms. CHARMAIN DIOSDADO is a 65 y.o. year old, female patient evaluated today after our last contact on 02/14/2021. Ms. Openshaw  has a past medical history of Anxiety, Asthma, Breast mass, Cervical disc disease, Chronic pain syndrome, COPD (chronic obstructive pulmonary disease) ([place]), Degenerative disc disease, lumbar, Depression, Fibromyalgia, Fibromyalgia, GERD (gastroesophageal reflux disease), Glaucoma, Graves disease, Headache, Hemorrhoids, Hyperlipidemia, Hypertension, Hyperthyroidism, Lumbar disc disease, Obesity, Psoriatic arthritis ([place]), and Thyroid disease. She also  has a past surgical history that includes carpel tunn (Right); Hand surgery (Right); carpel tunnel (Left); Cesarean section; Shoulder surgery (Right); Back surgery; Neck surgery; Total hip arthroplasty (Right); Excision Morton's neuroma (Left, 02/05/2017); Colonoscopy; Joint replacement; Esophagogastroduodenoscopy (egd) with propofol (N/A, 06/26/2017); Knee Arthroplasty (Left, 08/11/2017); Knee arthroscopy (Right, 07/02/2018); Reverse shoulder arthroplasty (Left, 06/24/2019); Thoracic laminectomy for spinal cord stimulator (N/A, 08/07/2020); Colonoscopy with propofol (N/A, 10/20/2020); Cardiac catheterization; Tubal ligation; and Knee Arthroplasty (Right, 02/05/2021). Ms. Follansbee has a current medication list which includes the following prescription(s): acetaminophen, vitamin c, atorvastatin, vitamin a, budesonide-formoterol, calcium carbonate, calcium-magnesium-vitamin d, celecoxib, celecoxib, vitamin d3, ciclopirox, clindamycin, clobetasol ointment, clonazepam, clotrimazole-betamethasone, coq-10, vitamin b-12, diphenhydramine, duloxetine, duloxetine, enoxaparin, esomeprazole, fexofenadine, fluconazole, fluocinolone acetonide  body, fluticasone, gabapentin, hydralazine, hydroxyzine, linaclotide, losartan-hydrochlorothiazide, menthol (topical analgesic), methimazole, methocarbamol, multiple vitamin, fish oil, oxycodone, probiotic product, propranolol, propylene glycol, sucralfate, terbinafine, tramadol, trazodone, triamcinolone ointment, turmeric, and vitamin e. She  reports that she quit smoking about 28 years ago. She has never used smokeless tobacco. She reports previous drug use. She reports that she does not drink alcohol. Ms. Sabina is allergic to cephalexin, ibuprofen, potassium chloride,  shellfish allergy, and aspirin.   HPI  Today, she is being contacted for a post-procedure assessment.   Postprocedural evaluation after caudal ESI done on 01/15/2021 Status post right knee arthroplasty 02/05/2021 so has been resting She is working with home physical therapy She states that she had her spinal cord stimulator turned off over the last 2 weeks and interestingly her low back and leg pain was better.  She states that when the stimulator is turned on, she feels that the stimulation is too high and she has spasms, increased pain and associated mild weakness in her lower extremities.  She has worked with [employer] in the past to try and optimize settings with little benefit.  We discussed contacting [facility] again to see if they can optimize program settings before sending to Dr. Lacinda Axon for explant.  Patient states that she is open to trying SCS program optimization once more however if this is not helpful, she wishes to have this removed.  She will follow-up with me in approximately 2 weeks for medication management.  If no improvement at that time, will refer back to Dr. Lacinda Axon for SCS explant. Of note, she did receive therapeutic benefit after her previous caudal, results of which are below.   Post-Procedure Evaluation  Procedure (01/15/2021):   Type: Palliative Epidural Steroid Injection          Region:  Caudal Level: Sacrococcygeal   Laterality: Midline       Sedation: Please see nurses note.  Effectiveness during initial hour after procedure(Ultra-Short Term Relief): 100 %   Local anesthetic used: Long-acting (4-6 hours) Effectiveness: Defined as any analgesic benefit obtained secondary to the administration of local anesthetics. This carries significant diagnostic value as to the etiological location, or anatomical origin, of the pain. Duration of benefit is expected to coincide with the duration of the local anesthetic used.  Effectiveness during initial 4-6 hours after procedure(Short-Term Relief): 100 %   Long-term benefit: Defined as any relief past the pharmacologic duration of the local anesthetics.  Effectiveness past the initial 6 hours after procedure(Long-Term Relief): 50 % (good pain relief x 2 days, the pain did return but not as severe)   Current benefits: Defined as benefit that persist at this time.   Analgesia:  50% improved Function: Somewhat improved ROM: Somewhat improved    Laboratory Chemistry Profile   Renal Lab Results  Component Value Date   BUN 8 02/15/2021   CREATININE 0.66 02/15/2021   BCR 11 (L) 04/27/2020   GFRAA 83 04/27/2020   GFRNONAA >60 02/15/2021     Hepatic Lab Results  Component Value Date   AST 21 01/25/2021   ALT 20 01/25/2021   ALBUMIN 4.3 01/25/2021   ALKPHOS 81 01/25/2021   LIPASE 27 12/14/2016     Electrolytes Lab Results  Component Value Date   NA 133 (L) 02/15/2021   K 3.4 (L) 02/15/2021   CL 99 02/15/2021   CALCIUM 9.2 02/15/2021   MG 1.9 08/27/2014     Bone No results found for: VD25OH, VD125OH2TOT, MW4132GM0, NU2725DG6, 25OHVITD1, 25OHVITD2, 25OHVITD3, TESTOFREE, TESTOSTERONE   Inflammation (CRP: Acute Phase) (ESR: Chronic Phase) Lab Results  Component Value Date   CRP 0.5 01/25/2021   ESRSEDRATE 6 01/25/2021       Note: Above Lab results reviewed.  Imaging  US Venous Img Lower Unilateral Right CLINICAL  DATA:  Worsening leg swelling.  EXAM: RIGHT LOWER EXTREMITY VENOUS DOPPLER ULTRASOUND  TECHNIQUE: Gray-scale sonography with compression, as well as color  and duplex ultrasound, were performed to evaluate the deep venous system(s) from the level of the common femoral vein through the popliteal and proximal calf veins.  COMPARISON:  None.  FINDINGS: VENOUS  Normal compressibility of the common femoral, superficial femoral, and popliteal veins, as well as the visualized calf veins. Visualized portions of profunda femoral vein and great saphenous vein unremarkable. No filling defects to suggest DVT on grayscale or color Doppler imaging. Doppler waveforms show normal direction of venous flow, normal respiratory plasticity and response to augmentation.  Limited views of the contralateral common femoral vein are unremarkable.  OTHER  In the distal right thigh, superomedial to the patient's bandaging there is a complex fluid collection measuring 4.7 x 3 5 x 8.8 cm. There is a large vessel abutting the collection without substantial internal flow within the collection.  IMPRESSION: 1. No evidence of DVT. 2. In the distal right thigh, superomedial to the patient's bandaging there is a complex fluid collection measuring up to 8.8 cm, which is nonspecific but most likely represents a postoperative seroma/hematoma given the patient's recent surgery.  Electronically Signed   By: Margaretha Sheffield MD   On: 02/15/2021 06:58 DG Knee 2 Views Right CLINICAL DATA:  Worsening leg swelling up to recent surgery.  EXAM: RIGHT KNEE - 1-2 VIEW  COMPARISON:  x-ray right knee 02/05/2021  FINDINGS: No cortical erosion or destruction. No acute displaced fracture or dislocation of the knee. Prior external fixation osseous changes noted.  At least small knee joint effusion noted.  Interval increase in subcutaneus soft tissue edema overlying a right knee total arthroplasty. No surgical  hardware fracture or surrounding lucency. Skin staples are again noted.  IMPRESSION: Interval increase in subcutaneus soft tissue edema and likely small joint effusion in a patient status post right knee arthroplasty. Findings concerning for infection.  Electronically Signed   By: Iven Finn M.D.   On: 02/15/2021 05:18 CT Angio Chest PE W and/or Wo Contrast CLINICAL DATA:  High probability for pulmonary embolism. Swollen leg after right knee replacement  EXAM: CT ANGIOGRAPHY CHEST WITH CONTRAST  TECHNIQUE: Multidetector CT imaging of the chest was performed using the standard protocol during bolus administration of intravenous contrast. Multiplanar CT image reconstructions and MIPs were obtained to evaluate the vascular anatomy.  CONTRAST:  19mL OMNIPAQUE IOHEXOL 350 MG/ML SOLN  COMPARISON:  03/20/2016  FINDINGS: Cardiovascular: Satisfactory opacification of the pulmonary arteries to the segmental level. No evidence of pulmonary embolism. Normal heart size. No pericardial effusion. Coronary calcification.  Mediastinum/Nodes: Negative for adenopathy or mass  Lungs/Pleura: There is no edema, consolidation, effusion, or pneumothorax.Stable and benign 4 mm right upper lobe pulmonary nodule on 8:31.  Upper Abdomen: Negative  Musculoskeletal: Left glenohumeral arthroplasty. Generalized thoracic disc space narrowing and ridging. A lower thoracic dorsal column stimulator is present.  Review of the MIP images confirms the above findings.  IMPRESSION: Negative for pulmonary embolism or other acute finding.  Electronically Signed   By: Monte Fantasia M.D.   On: 02/15/2021 05:08 DG Chest 2 View CLINICAL DATA:  Chest pain, dyspnea  EXAM: CHEST - 2 VIEW  COMPARISON:  06/25/2019  FINDINGS: Lungs are well expanded, symmetric, and clear. No pneumothorax or pleural effusion. Cardiac size within normal limits. Pulmonary vascularity is normal. Interval placement of  dorsal column stimulator leads with leads overlying T8-9. Osseous structures are age-appropriate. No acute bone abnormality.  IMPRESSION: No active cardiopulmonary disease. Interval dorsal column stimulator lead placement.  Electronically Signed   By: Cassandria Anger  Christa See MD   On: 02/15/2021 02:35  Assessment  The primary encounter diagnosis was Lumbar radiculopathy. Diagnoses of History of lumbar laminectomy for spinal cord decompression, Failed back surgical syndrome, Back pain at L4-L5 level, and Chronic pain syndrome were also pertinent to this visit.  Plan of Care    Repeat caudal as needed.  Follow-up in approximately 2 weeks for medication management.  I will contact [facility] spinal cord stimulator representative to have her touch base with Freda Munro for [employer] optimization.  If this is not helpful, I will send patient back to Dr. Lacinda Axon for spinal cord stimulator explant.   Follow-up plan:   Return in about 10 days (around 03/01/2021) for Medication Management, in person.     Status post right L5-S1 ESI 03/23/2019, 6 out of 8 cc injected, #2 on 04/21/19, 5 cc more concentrated, #3 on 06/14/2019: 6 cc, #4 09/29/19 left L5/S1 IL , #5 12/08/19, #6 02/23/20. [employer] SCS trial 06/26/20 significant functional and analgesic benefit.  Patient ambulated during clinic which she has never done before.  Plan for implant with Dr Lacinda Axon           Recent Visits Date Type Provider Dept  01/15/21 Procedure visit [name], MD [facility]  01/02/21 Telemedicine [name], MD [facility]  Showing recent visits within past 90 days and meeting all other requirements Today's Visits Date Type Provider Dept  02/19/21 Telemedicine [name], MD [facility]  Showing today's visits and meeting all other requirements Future Appointments No visits were found meeting these conditions. Showing future appointments within next 90 days and meeting all other  requirements I discussed the assessment and treatment plan with the patient. The patient was provided an opportunity to ask questions and all were answered. The patient agreed with the plan and demonstrated an understanding of the instructions.  Patient advised to call back or seek an in-person evaluation if the symptoms or condition worsens.  Duration of encounter: 32minutes.  Note by: [name], MD Date: 02/19/2021; Time: 1:11 PM

## 2021-02-20 DIAGNOSIS — Z96651 Presence of right artificial knee joint: Secondary | ICD-10-CM | POA: Diagnosis not present

## 2021-02-20 DIAGNOSIS — G894 Chronic pain syndrome: Secondary | ICD-10-CM | POA: Diagnosis not present

## 2021-02-20 DIAGNOSIS — Z471 Aftercare following joint replacement surgery: Secondary | ICD-10-CM | POA: Diagnosis not present

## 2021-02-20 DIAGNOSIS — F418 Other specified anxiety disorders: Secondary | ICD-10-CM | POA: Diagnosis not present

## 2021-02-20 DIAGNOSIS — I10 Essential (primary) hypertension: Secondary | ICD-10-CM | POA: Diagnosis not present

## 2021-02-20 DIAGNOSIS — E119 Type 2 diabetes mellitus without complications: Secondary | ICD-10-CM | POA: Diagnosis not present

## 2021-02-20 DIAGNOSIS — R262 Difficulty in walking, not elsewhere classified: Secondary | ICD-10-CM | POA: Diagnosis not present

## 2021-02-21 ENCOUNTER — Telehealth: Payer: Self-pay | Admitting: Student in an Organized Health Care Education/Training Program

## 2021-02-21 ENCOUNTER — Ambulatory Visit (INDEPENDENT_AMBULATORY_CARE_PROVIDER_SITE_OTHER): Payer: Medicare HMO | Admitting: Licensed Clinical Social Worker

## 2021-02-21 ENCOUNTER — Other Ambulatory Visit: Payer: Self-pay

## 2021-02-21 DIAGNOSIS — R262 Difficulty in walking, not elsewhere classified: Secondary | ICD-10-CM | POA: Diagnosis not present

## 2021-02-21 DIAGNOSIS — Z471 Aftercare following joint replacement surgery: Secondary | ICD-10-CM | POA: Diagnosis not present

## 2021-02-21 DIAGNOSIS — Z96651 Presence of right artificial knee joint: Secondary | ICD-10-CM | POA: Diagnosis not present

## 2021-02-21 DIAGNOSIS — F32A Depression, unspecified: Secondary | ICD-10-CM

## 2021-02-21 DIAGNOSIS — I10 Essential (primary) hypertension: Secondary | ICD-10-CM | POA: Diagnosis not present

## 2021-02-21 DIAGNOSIS — F418 Other specified anxiety disorders: Secondary | ICD-10-CM | POA: Diagnosis not present

## 2021-02-21 DIAGNOSIS — F419 Anxiety disorder, unspecified: Secondary | ICD-10-CM

## 2021-02-21 DIAGNOSIS — F4325 Adjustment disorder with mixed disturbance of emotions and conduct: Secondary | ICD-10-CM

## 2021-02-21 DIAGNOSIS — G894 Chronic pain syndrome: Secondary | ICD-10-CM | POA: Diagnosis not present

## 2021-02-21 DIAGNOSIS — E119 Type 2 diabetes mellitus without complications: Secondary | ICD-10-CM | POA: Diagnosis not present

## 2021-02-21 NOTE — Progress Notes (Deleted)
LCSW counselor tried to connect with patient for scheduled appointment via MyChart video text request x 2 and email request; also tried to connect via phone without success. LCSW counselor left message for patient to call office number to reschedule OPT appointment.

## 2021-02-21 NOTE — Telephone Encounter (Signed)
Patient is requesting referral to psychiatry. She already sees psychologist but needs to see psychiatry instead and was told by Dr. Ouida Sills the referral should come from [name] since he is pain med phys.

## 2021-02-21 NOTE — Addendum Note (Signed)
Addended by: Granville Lewis on: 02/21/2021 12:34 PM   Modules accepted: Level of Service

## 2021-02-21 NOTE — Progress Notes (Addendum)
Virtual Visit via Audio Note  I connected with Christine Higgins on 02/21/21 at 11:00 AM EDT by an audio enabled telemedicine application and verified that I am speaking with the correct person using two identifiers.  Location: Patient: home Provider: [facility]   I discussed the limitations of evaluation and management by telemedicine and the availability of in person appointments. The patient expressed understanding and agreed to proceed.   I discussed the assessment and treatment plan with the patient. The patient was provided an opportunity to ask questions and all were answered. The patient agreed with the plan and demonstrated an understanding of the instructions.   The patient was advised to call back or seek an in-person evaluation if the symptoms worsen or if the condition fails to improve as anticipated.  I provided 30 minutes of non-face-to-face time during this encounter.   [place], LCSW   THERAPIST PROGRESS NOTE  Session Time: 11:30a-12p  Participation Level: Active  Behavioral Response: NeatAlertDepressed  Type of Therapy: Individual Therapy  Treatment Goals addressed: Coping and Diagnosis: MDD  Interventions: CBT and Supportive  Summary: Christine Higgins is a 65 y.o. female who presents with Continuing symptoms related to adjustment disorder diagnosis. Patient reports that overall mood has been stable and that she is managing situational stressors and anxiety well. Patient reports good quality and quantity of sleep with the exception of the last two days. Patient reports she sleeps best on the nights that she takes Trazodone.  Session started late due to technological difficulties.Counselor had to call patient on pts phone for an audio session because the video was not working on her end, and phone calls were not going through when using MyChart application.  Patient had to get right knee replacement surgery and is still in recovery mode at this time. Patient  reports that she had reached out to [facility] senior resources for housing assistance, and patient is now in assisted living facility. Patient is very happy with this--she states she's happy with the facility, happy with the nurses, and happy with her fellow residents. Discussed family relationships, and patient feels that relationships are going good with her daughter and grandchildren.  Patient reports that currently her primary care physician is not going to prescribe  psychiatric medications for her, so she needs a referral to a psychiatrist to manage her medications. PCP is referring to psych.  Continued recommendations are as follows: self care behaviors, positive social engagements, focusing on overall work/home/life balance, and focusing on positive physical and emotional wellness.    Suicidal/Homicidal: No  Therapist Response: Freda Munro is doing a good job of describing current past experiences with specific fears prominent worries and anxiety symptoms including overall impact on functioning and her attempts to resolve it. Patient has the ability to identify and anxiety coping mechanism that has been successful in the past and increase its use. Freda Munro demonstrates that she understands how faults, physical feelings, and behavioral actions contribute to anxiety and overall treatment. Patient is continuing to develop healthy interpersonal relationships that lead to the alleviation and help prevent relapse of depression symptoms. Patient can utilize behavioral strategies to overcome depression. Patient is verbalizing more hopeful and positive statements regarding the future. These behaviors are reflective of progress and personal growth period treatment to continue as indicated.   Plan: Return again in 4 weeks  Diagnosis: Axis I: Adjustment Disorder with Mixed Disturbance of Emotions and Conduct    Axis II: No diagnosis    [place], LCSW 02/21/2021

## 2021-02-21 NOTE — Telephone Encounter (Signed)
Can you help patient with this. Please send to front desk, your response. Thanks

## 2021-02-22 DIAGNOSIS — Z96651 Presence of right artificial knee joint: Secondary | ICD-10-CM | POA: Diagnosis not present

## 2021-02-22 DIAGNOSIS — R262 Difficulty in walking, not elsewhere classified: Secondary | ICD-10-CM | POA: Diagnosis not present

## 2021-02-22 DIAGNOSIS — F418 Other specified anxiety disorders: Secondary | ICD-10-CM | POA: Diagnosis not present

## 2021-02-22 DIAGNOSIS — E119 Type 2 diabetes mellitus without complications: Secondary | ICD-10-CM | POA: Diagnosis not present

## 2021-02-22 DIAGNOSIS — G894 Chronic pain syndrome: Secondary | ICD-10-CM | POA: Diagnosis not present

## 2021-02-22 DIAGNOSIS — Z471 Aftercare following joint replacement surgery: Secondary | ICD-10-CM | POA: Diagnosis not present

## 2021-02-22 DIAGNOSIS — I10 Essential (primary) hypertension: Secondary | ICD-10-CM | POA: Diagnosis not present

## 2021-02-22 NOTE — Telephone Encounter (Signed)
Called and notified patient.

## 2021-02-27 ENCOUNTER — Ambulatory Visit
Payer: Medicare HMO | Attending: Student in an Organized Health Care Education/Training Program | Admitting: Student in an Organized Health Care Education/Training Program

## 2021-02-27 ENCOUNTER — Telehealth: Payer: Self-pay | Admitting: *Deleted

## 2021-02-27 ENCOUNTER — Other Ambulatory Visit: Payer: Self-pay

## 2021-02-27 ENCOUNTER — Encounter: Payer: Self-pay | Admitting: Student in an Organized Health Care Education/Training Program

## 2021-02-27 DIAGNOSIS — M961 Postlaminectomy syndrome, not elsewhere classified: Secondary | ICD-10-CM | POA: Diagnosis not present

## 2021-02-27 DIAGNOSIS — M5137 Other intervertebral disc degeneration, lumbosacral region: Secondary | ICD-10-CM | POA: Diagnosis not present

## 2021-02-27 DIAGNOSIS — M5416 Radiculopathy, lumbar region: Secondary | ICD-10-CM

## 2021-02-27 DIAGNOSIS — M51379 Other intervertebral disc degeneration, lumbosacral region without mention of lumbar back pain or lower extremity pain: Secondary | ICD-10-CM

## 2021-02-27 DIAGNOSIS — M545 Low back pain, unspecified: Secondary | ICD-10-CM | POA: Diagnosis not present

## 2021-02-27 DIAGNOSIS — Z9889 Other specified postprocedural states: Secondary | ICD-10-CM

## 2021-02-27 DIAGNOSIS — T85192S Other mechanical complication of implanted electronic neurostimulator (electrode) of spinal cord, sequela: Secondary | ICD-10-CM | POA: Diagnosis not present

## 2021-02-27 DIAGNOSIS — G894 Chronic pain syndrome: Secondary | ICD-10-CM

## 2021-02-27 DIAGNOSIS — M797 Fibromyalgia: Secondary | ICD-10-CM

## 2021-02-27 MED ORDER — TRAMADOL HCL 50 MG PO TABS: 100.0000 mg | ORAL_TABLET | Freq: Two times a day (BID) | ORAL | 2 refills | Status: DC | PRN

## 2021-02-27 NOTE — Telephone Encounter (Signed)
Attempted to call for pre appointment review of allergies/meds. Message left. 

## 2021-02-27 NOTE — Progress Notes (Signed)
Patient: Christine Higgins  Service Category: E/M  Provider: [name], MD  DOB: April 20, 1956  DOS: 02/27/2021  Location: Office  MRN: [id number]  Setting: Ambulatory outpatient  Referring Provider: Kirk Ruths, MD  Type: Established Patient  Specialty: Interventional Pain Management  PCP: Kirk Ruths, MD  Location: Home  Delivery: TeleHealth     Virtual Encounter - Pain Management PROVIDER NOTE: Information contained herein reflects review and annotations entered in association with encounter. Interpretation of such information and data should be left to medically-trained personnel. Information provided to patient can be located elsewhere in the medical record under "Patient Instructions". Document created using STT-dictation technology, any transcriptional errors that may result from process are unintentional.    Contact & Pharmacy Preferred: [phone] Home: [phone] (home) Mobile: [phone] (mobile) E-mail: [email or web address]  [id number] [name], [place] [address] [place] [address] Phone: [phone] Fax: [phone]   Pre-screening  Christine Higgins offered "in-person" vs "virtual" encounter. She indicated preferring virtual for this encounter.   Reason COVID-19*  Social distancing based on CDC and AMA recommendations.   I contacted Christine Higgins on 02/27/2021 via video conference.      I clearly identified myself as [name], MD. I verified that I was speaking with the correct person using two identifiers (Name: Christine Higgins, and date of birth: Jul 07, 1956).  Consent I sought verbal advanced consent from Christine Higgins for virtual visit interactions. I informed Christine Higgins of possible security and privacy concerns, risks, and limitations associated with providing "not-in-person" medical evaluation and management services. I also informed Christine Higgins of the availability of "in-person" appointments. Finally, I informed her that  there would be a charge for the virtual visit and that she could be  personally, fully or partially, financially responsible for it. Christine Higgins expressed understanding and agreed to proceed.   Historic Elements   Christine Higgins is a 65 y.o. year old, female patient evaluated today after our last contact on 02/21/2021. Christine Higgins  has a past medical history of Anxiety, Asthma, Breast mass, Cervical disc disease, Chronic pain syndrome, COPD (chronic obstructive pulmonary disease) ([place]), Degenerative disc disease, lumbar, Depression, Fibromyalgia, Fibromyalgia, GERD (gastroesophageal reflux disease), Glaucoma, Graves disease, Headache, Hemorrhoids, Hyperlipidemia, Hypertension, Hyperthyroidism, Lumbar disc disease, Obesity, Psoriatic arthritis ([place]), and Thyroid disease. She also  has a past surgical history that includes carpel tunn (Right); Hand surgery (Right); carpel tunnel (Left); Cesarean section; Shoulder surgery (Right); Back surgery; Neck surgery; Total hip arthroplasty (Right); Excision Morton's neuroma (Left, 02/05/2017); Colonoscopy; Joint replacement; Esophagogastroduodenoscopy (egd) with propofol (N/A, 06/26/2017); Knee Arthroplasty (Left, 08/11/2017); Knee arthroscopy (Right, 07/02/2018); Reverse shoulder arthroplasty (Left, 06/24/2019); Thoracic laminectomy for spinal cord stimulator (N/A, 08/07/2020); Colonoscopy with propofol (N/A, 10/20/2020); Cardiac catheterization; Tubal ligation; and Knee Arthroplasty (Right, 02/05/2021). Christine Higgins has a current medication list which includes the following prescription(s): acetaminophen, vitamin c, atorvastatin, vitamin a, budesonide-formoterol, calcium carbonate, calcium-magnesium-vitamin d, celecoxib, celecoxib, vitamin d3, ciclopirox, clindamycin, clobetasol ointment, clonazepam, clotrimazole-betamethasone, coq-10, vitamin b-12, diphenhydramine, duloxetine, duloxetine, enoxaparin, esomeprazole, fexofenadine, fluconazole, fluocinolone acetonide body,  fluticasone, gabapentin, hydralazine, hydroxyzine, linaclotide, losartan-hydrochlorothiazide, menthol (topical analgesic), methimazole, methocarbamol, multiple vitamin, fish oil, oxycodone, probiotic product, propranolol, propylene glycol, sucralfate, terbinafine, [START ON 03/08/2021] tramadol, trazodone, triamcinolone ointment, turmeric, and vitamin e. She  reports that she quit smoking about 28 years ago. She has never used smokeless tobacco. She reports previous drug use. She reports that she does not drink alcohol. Christine Higgins is allergic to cephalexin, ibuprofen,  potassium chloride, shellfish allergy, and aspirin.   HPI  Today, she is being contacted for medication management.  Refill of tramadol today.  Patient takes 100 mg twice a day. She has worked at [employer] with [employer] to try and optimize programming but states that spinal cord stimulation increases her leg pain.  She states that this was not the case during her trial but has been more frequent after her implant. I have obtained an x-ray and there is no evidence of lead migration or lead fracture She is interested in having this explanted, I will refer her back to Dr. Lacinda Axon for SCS explant She is interested in repeating a caudal and we can do that after August 23 which will be 3 months from her previous caudal ESI  Pharmacotherapy Assessment  Analgesic: Tramadol 50 mg 3 times daily as needed, quantity 90/month; MME equals 15   Monitoring: [place] PMP: PDMP reviewed during this encounter.       Pharmacotherapy: No side-effects or adverse reactions reported. Compliance: No problems identified. Effectiveness: Clinically acceptable. Plan: Refer to "POC".  UDS:  Summary  Date Value Ref Range Status  09/29/2019 Note  Final    Comment:    ==================================================================== Compliance Drug Analysis, Ur ==================================================================== Test                              Result       Flag       Units Drug Present and Declared for Prescription Verification   7-aminoclonazepam              65           EXPECTED   ng/mg creat    7-aminoclonazepam is an expected metabolite of clonazepam. Source of    clonazepam is a scheduled prescription medication.   Tramadol                       >4310        EXPECTED   ng/mg creat   O-Desmethyltramadol            >4310        EXPECTED   ng/mg creat   N-Desmethyltramadol            3447         EXPECTED   ng/mg creat    Source of tramadol is a prescription medication. O-desmethyltramadol    and N-desmethyltramadol are expected metabolites of tramadol.   Gabapentin                     PRESENT      EXPECTED   Cyclobenzaprine                PRESENT      EXPECTED   Desmethylcyclobenzaprine       PRESENT      EXPECTED    Desmethylcyclobenzaprine is an expected metabolite of    cyclobenzaprine.   Duloxetine                     PRESENT      EXPECTED   Trazodone                      PRESENT      EXPECTED   1,3 chlorophenyl piperazine    PRESENT      EXPECTED    1,3-chlorophenyl piperazine is an expected  metabolite of trazodone.   Diphenhydramine                PRESENT      EXPECTED   Propranolol                    PRESENT      EXPECTED Drug Absent but Declared for Prescription Verification   Oxycodone                      Not Detected UNEXPECTED ng/mg creat   Acetaminophen                  Not Detected UNEXPECTED    Acetaminophen, as indicated in the declared medication list, is not    always detected even when used as directed.   Hydroxyzine                    Not Detected UNEXPECTED ==================================================================== Test                      Result    Flag   Units      Ref Range   Creatinine              116              mg/dL      >=71 ==================================================================== Declared Medications:  The flagging and interpretation on this report are based on  the  following declared medications.  Unexpected results may arise from  inaccuracies in the declared medications.  **Note: The testing scope of this panel includes these medications:  Clonazepam  Cyclobenzaprine (Flexeril)  Diphenhydramine (Benadryl)  Duloxetine (Cymbalta)  Gabapentin (Neurontin)  Hydroxyzine  Oxycodone (Roxicodone)  Propranolol (Inderal)  Tramadol (Ultram)  Trazodone (Desyrel)  **Note: The testing scope of this panel does not include small to  moderate amounts of these reported medications:  Acetaminophen  **Note: The testing scope of this panel does not include the  following reported medications:  Apixaban  Atorvastatin (Lipitor)  Betamethasone (Lotrisone)  Budesonide (Symbicort)  Calcium  Ciprofloxacin (Cipro)  Clobetasol  Clotrimazole (Lotrisone)  Esomeprazole (Nexium)  Eye Drops  Formoterol (Symbicort)  Hydralazine (Apresoline)  Hydrochlorothiazide (Hyzaar)  Iron  Losartan (Hyzaar)  Menthol  Multivitamin  Probiotic  Sodium Chloride  Sucralfate (Carafate)  Turmeric  Ubiquinone (CoQ10)  Vitamin B12  Vitamin C  Vitamin D3  Vitamin E ==================================================================== For clinical consultation, please call [phone]. ====================================================================     Laboratory Chemistry Profile   Renal Lab Results  Component Value Date   BUN 8 02/15/2021   CREATININE 0.66 02/15/2021   BCR 11 (L) 04/27/2020   GFRAA 83 04/27/2020   GFRNONAA >60 02/15/2021    Hepatic Lab Results  Component Value Date   AST 21 01/25/2021   ALT 20 01/25/2021   ALBUMIN 4.3 01/25/2021   ALKPHOS 81 01/25/2021   LIPASE 27 12/14/2016    Electrolytes Lab Results  Component Value Date   NA 133 (L) 02/15/2021   K 3.4 (L) 02/15/2021   CL 99 02/15/2021   CALCIUM 9.2 02/15/2021   MG 1.9 08/27/2014    Bone No results found for: VD25OH, VD125OH2TOT, VX9247LM8, BF3541VT2, 25OHVITD1, 25OHVITD2,  25OHVITD3, TESTOFREE, TESTOSTERONE  Inflammation (CRP: Acute Phase) (ESR: Chronic Phase) Lab Results  Component Value Date   CRP 0.5 01/25/2021   ESRSEDRATE 6 01/25/2021          Note: Above Lab results reviewed.  Imaging  US Venous Img Lower Unilateral Right CLINICAL DATA:  Worsening leg swelling.  EXAM: RIGHT LOWER EXTREMITY VENOUS DOPPLER ULTRASOUND  TECHNIQUE: Gray-scale sonography with compression, as well as color and duplex ultrasound, were performed to evaluate the deep venous system(s) from the level of the common femoral vein through the popliteal and proximal calf veins.  COMPARISON:  None.  FINDINGS: VENOUS  Normal compressibility of the common femoral, superficial femoral, and popliteal veins, as well as the visualized calf veins. Visualized portions of profunda femoral vein and great saphenous vein unremarkable. No filling defects to suggest DVT on grayscale or color Doppler imaging. Doppler waveforms show normal direction of venous flow, normal respiratory plasticity and response to augmentation.  Limited views of the contralateral common femoral vein are unremarkable.  OTHER  In the distal right thigh, superomedial to the patient's bandaging there is a complex fluid collection measuring 4.7 x 3 5 x 8.8 cm. There is a large vessel abutting the collection without substantial internal flow within the collection.  IMPRESSION: 1. No evidence of DVT. 2. In the distal right thigh, superomedial to the patient's bandaging there is a complex fluid collection measuring up to 8.8 cm, which is nonspecific but most likely represents a postoperative seroma/hematoma given the patient's recent surgery.  Electronically Signed   By: Margaretha Sheffield MD   On: 02/15/2021 06:58 DG Knee 2 Views Right CLINICAL DATA:  Worsening leg swelling up to recent surgery.  EXAM: RIGHT KNEE - 1-2 VIEW  COMPARISON:  x-ray right knee 02/05/2021  FINDINGS: No cortical  erosion or destruction. No acute displaced fracture or dislocation of the knee. Prior external fixation osseous changes noted.  At least small knee joint effusion noted.  Interval increase in subcutaneus soft tissue edema overlying a right knee total arthroplasty. No surgical hardware fracture or surrounding lucency. Skin staples are again noted.  IMPRESSION: Interval increase in subcutaneus soft tissue edema and likely small joint effusion in a patient status post right knee arthroplasty. Findings concerning for infection.  Electronically Signed   By: Iven Finn M.D.   On: 02/15/2021 05:18 CT Angio Chest PE W and/or Wo Contrast CLINICAL DATA:  High probability for pulmonary embolism. Swollen leg after right knee replacement  EXAM: CT ANGIOGRAPHY CHEST WITH CONTRAST  TECHNIQUE: Multidetector CT imaging of the chest was performed using the standard protocol during bolus administration of intravenous contrast. Multiplanar CT image reconstructions and MIPs were obtained to evaluate the vascular anatomy.  CONTRAST:  171mL OMNIPAQUE IOHEXOL 350 MG/ML SOLN  COMPARISON:  03/20/2016  FINDINGS: Cardiovascular: Satisfactory opacification of the pulmonary arteries to the segmental level. No evidence of pulmonary embolism. Normal heart size. No pericardial effusion. Coronary calcification.  Mediastinum/Nodes: Negative for adenopathy or mass  Lungs/Pleura: There is no edema, consolidation, effusion, or pneumothorax.Stable and benign 4 mm right upper lobe pulmonary nodule on 8:31.  Upper Abdomen: Negative  Musculoskeletal: Left glenohumeral arthroplasty. Generalized thoracic disc space narrowing and ridging. A lower thoracic dorsal column stimulator is present.  Review of the MIP images confirms the above findings.  IMPRESSION: Negative for pulmonary embolism or other acute finding.  Electronically Signed   By: Monte Fantasia M.D.   On: 02/15/2021 05:08 DG Chest 2  View CLINICAL DATA:  Chest pain, dyspnea  EXAM: CHEST - 2 VIEW  COMPARISON:  06/25/2019  FINDINGS: Lungs are well expanded, symmetric, and clear. No pneumothorax or pleural effusion. Cardiac size within normal limits. Pulmonary vascularity is normal. Interval placement of dorsal column stimulator leads with leads  overlying T8-9. Osseous structures are age-appropriate. No acute bone abnormality.  IMPRESSION: No active cardiopulmonary disease. Interval dorsal column stimulator lead placement.  Electronically Signed   By: Fidela Salisbury MD   On: 02/15/2021 02:35  Assessment  The primary encounter diagnosis was Lumbar radiculopathy. Diagnoses of History of lumbar laminectomy for spinal cord decompression, Failed back surgical syndrome, Back pain at L4-L5 level, Fibromyalgia, DDD (degenerative disc disease), lumbosacral, Chronic pain syndrome, and Failure of spinal cord stimulator, sequela were also pertinent to this visit.  Plan of Care  Problem-specific:  No problem-specific Assessment & Plan notes found for this encounter.  Christine Higgins has a current medication list which includes the following long-term medication(s): atorvastatin, budesonide-formoterol, calcium carbonate, clonazepam, duloxetine, duloxetine, enoxaparin, fexofenadine, fluticasone, gabapentin, hydralazine, linaclotide, losartan-hydrochlorothiazide, methimazole, propranolol, and trazodone.  Pharmacotherapy (Medications Ordered): Meds ordered this encounter  Medications   traMADol (ULTRAM) 50 MG tablet    Sig: Take 2 tablets (100 mg total) by mouth every 12 (twelve) hours as needed for severe pain.    Dispense:  120 tablet    Refill:  2    Fill one day early if pharmacy is closed on scheduled refill date.   Follow-up for caudal ESI after 04/17/21 (3 months from her previous one)  Orders:  Orders Placed This Encounter  Procedures   Caudal Epidural Injection    Standing Status:   Future    Standing  Expiration Date:   06/30/2021    Scheduling Instructions:     Laterality: Midline     Level(s): Sacrococcygeal canal (Tailbone area)     Sedation: Patient's choice     Scheduling Timeframe: As soon as pre-approved    Order Specific Question:   Where will this procedure be performed?    Answer:   [facility]   Ambulatory referral to Neurosurgery    Referral Priority:   Routine    Referral Type:   Surgical    Referral Reason:   Specialty Services Required    Referred to Provider:   [name], MD    Number of Visits Requested:   1  Referral to Dr. Lacinda Axon for SCS explant  Follow-up plan:   Return in about 7 weeks (around 04/18/2021) for Caudal ESI.    Recent Visits Date Type Provider Dept  02/19/21 Telemedicine [name], MD [facility]  01/15/21 Procedure visit [name], MD [facility]  01/02/21 Telemedicine [name], MD [facility]  Showing recent visits within past 90 days and meeting all other requirements Today's Visits Date Type Provider Dept  02/27/21 Telemedicine [name], MD [facility]  Showing today's visits and meeting all other requirements Future Appointments No visits were found meeting these conditions. Showing future appointments within next 90 days and meeting all other requirements I discussed the assessment and treatment plan with the patient. The patient was provided an opportunity to ask questions and all were answered. The patient agreed with the plan and demonstrated an understanding of the instructions.  Patient advised to call back or seek an in-person evaluation if the symptoms or condition worsens.  Duration of encounter: 44minutes.  Note by: [name], MD Date: 02/27/2021; Time: 2:32 PM

## 2021-03-01 ENCOUNTER — Ambulatory Visit: Payer: Medicare HMO | Admitting: Dermatology

## 2021-03-03 ENCOUNTER — Other Ambulatory Visit: Payer: Self-pay | Admitting: Dermatology

## 2021-03-03 DIAGNOSIS — R21 Rash and other nonspecific skin eruption: Secondary | ICD-10-CM

## 2021-03-07 DIAGNOSIS — L4050 Arthropathic psoriasis, unspecified: Secondary | ICD-10-CM | POA: Diagnosis not present

## 2021-03-12 ENCOUNTER — Ambulatory Visit: Payer: Medicare HMO | Attending: Orthopedic Surgery

## 2021-03-12 ENCOUNTER — Telehealth: Payer: Self-pay | Admitting: *Deleted

## 2021-03-12 ENCOUNTER — Telehealth: Payer: Self-pay

## 2021-03-12 ENCOUNTER — Other Ambulatory Visit: Payer: Self-pay | Admitting: Dermatology

## 2021-03-12 DIAGNOSIS — G8929 Other chronic pain: Secondary | ICD-10-CM | POA: Diagnosis not present

## 2021-03-12 DIAGNOSIS — R21 Rash and other nonspecific skin eruption: Secondary | ICD-10-CM

## 2021-03-12 DIAGNOSIS — R262 Difficulty in walking, not elsewhere classified: Secondary | ICD-10-CM | POA: Insufficient documentation

## 2021-03-12 DIAGNOSIS — M25561 Pain in right knee: Secondary | ICD-10-CM | POA: Insufficient documentation

## 2021-03-12 NOTE — Patient Instructions (Signed)
Access Code: [email or web address] URL: [email or web address] Date: 03/12/2021 Prepared by: Joneen Boers  Exercises Seated Quad Set - 3 x daily - 7 x weekly - 3 sets - 10 reps - 5 seconds hold

## 2021-03-12 NOTE — Telephone Encounter (Signed)
Pt wants to know where he [facility] were sent [facility]

## 2021-03-12 NOTE — Telephone Encounter (Signed)
Spoke with patient and she states that [facility], [facility].    [facility] [address] [facility], states it was filled on 03/12/21 but not by them.  Jacqulyn Bath states he will call insurance company to see what is going on with Rx.

## 2021-03-12 NOTE — Telephone Encounter (Signed)
Patient requests that we change her pharmacy.  It was changed to [facility] [place].  Patient states she has her meds at this point.

## 2021-03-12 NOTE — Therapy (Signed)
[facility] [address], [place], [address] Phone: [phone]   Fax:  [phone]  Physical Therapy Evaluation  Patient Details  Name: Christine Higgins MRN: [id number] Date of Birth: Dec 28, 1955 Referring Provider (PT): Tamala Julian, [place]  Encounter Date: 03/12/2021   PT End of Session - 03/12/21 1632     Visit Number 1    Number of Visits 17    Date for PT Re-Evaluation 05/10/21    Authorization Type 1    Authorization Time Period 10    PT Start Time 1632    PT Stop Time 5427    PT Time Calculation (min) 43 min    Activity Tolerance Patient tolerated treatment well    Behavior During Therapy WFL for tasks assessed/performed             Past Medical History:  Diagnosis Date   Anxiety    Asthma    Breast mass    LEFT x 3 months per pt and palpated by physician   Cervical disc disease    Chronic pain syndrome    COPD (chronic obstructive pulmonary disease) ([place])    Degenerative disc disease, lumbar    osteoarthritis   Depression    Fibromyalgia    Fibromyalgia    GERD (gastroesophageal reflux disease)    Glaucoma    Graves disease    Headache    migraines   Hemorrhoids    Hyperlipidemia    Hypertension    Hyperthyroidism    Lumbar disc disease    Obesity    Psoriatic arthritis ([place])    Thyroid disease     Past Surgical History:  Procedure Laterality Date   BACK SURGERY     sumbar   CARDIAC CATHETERIZATION     carpel tunn Right    carpel tunnel Left    CESAREAN SECTION     COLONOSCOPY     COLONOSCOPY WITH PROPOFOL N/A 10/20/2020   Procedure: COLONOSCOPY WITH PROPOFOL;  Surgeon: Lesly Rubenstein, MD;  Location: [facility] ENDOSCOPY;  Service: Endoscopy;  Laterality: N/A;   ESOPHAGOGASTRODUODENOSCOPY (EGD) WITH PROPOFOL N/A 06/26/2017   Procedure: ESOPHAGOGASTRODUODENOSCOPY (EGD) WITH PROPOFOL;  Surgeon: Lollie Sails, MD;  Location: [facility] ENDOSCOPY;  Service: Endoscopy;   Laterality: N/A;   EXCISION MORTON'S NEUROMA Left 02/05/2017   Procedure: EXCISION MORTON'S NEUROMA;  Surgeon: Samara Deist, DPM;  Location: [facility];  Service: Podiatry;  Laterality: Left;  iva with local   HAND SURGERY Right    scar tissue removal   JOINT REPLACEMENT     right hip arthroplasty 08/25/15   KNEE ARTHROPLASTY Left 08/11/2017   Procedure: COMPUTER ASSISTED TOTAL KNEE ARTHROPLASTY;  Surgeon: Dereck Leep, MD;  Location: [facility] ORS;  Service: Orthopedics;  Laterality: Left;   KNEE ARTHROPLASTY Right 02/05/2021   Procedure: COMPUTER ASSISTED TOTAL KNEE ARTHROPLASTY;  Surgeon: Dereck Leep, MD;  Location: [facility] OR[facility];  Service: Orthopedics;  Laterality: Right;   KNEE ARTHROSCOPY Right 07/02/2018   Procedure: ARTHROSCOPY KNEE, Medial and Lateral  Chondroplasty;  Surgeon: Dereck Leep, MD;  Location: [facility] ORS;  Service: Orthopedics;  Laterality: Right;   NECK SURGERY     "disk implant"   REVERSE SHOULDER ARTHROPLASTY Left 06/24/2019   Procedure: REVERSE SHOULDER ARTHROPLASTY;  Surgeon: Corky Mull, MD;  Location: [facility];  Service: Orthopedics;  Laterality: Left;   SHOULDER SURGERY Right    spur removal   THORACIC LAMINECTOMY FOR SPINAL CORD STIMULATOR N/A 08/07/2020  Procedure: THORACIC SPINAL CORD STIMULATOR VIA LAMINECTOMY, PULSE GENERATOR;  Surgeon: [name], MD;  Location: [facility] OR[facility];  Service: Neurosurgery;  Laterality: N/A;   TOTAL HIP ARTHROPLASTY Right    TUBAL LIGATION      There were no vitals filed for this visit.    Subjective Assessment - 03/12/21 1637     Subjective R knee: 7.5/10 currently, and 9/10 at most for the past 2 weeks.    Pertinent History S/P R TKA on 02/05/2021. Pt states running behind in PT. Started home health PT, then [facility] PT and was unable to go there due to ride situation. Has L knee replaced around 2016. Pt was using as SPC on R side for her L LE sciatic symptoms prior to surgery. Sometimes holds SPC on L side due  to R knee pain. Pt also twisted her R knee 2 weeks ago resulting in R medial knee pain which has made it go up to 8.5 to 9/10 recently.    Currently in Pain? Yes    Pain Score 7     Pain Location Knee    Pain Orientation Right    Pain Type Chronic pain    Pain Onset More than a month ago    Pain Frequency Constant    Aggravating Factors  Being up on her feet.    Pain Relieving Factors Ice                [facility] PT Assessment - 03/12/21 1629       Assessment   Medical Diagnosis R TKA    Referring Provider (PT) Tamala Julian, PA    Onset Date/Surgical Date 02/05/21    Hand Dominance Right      Precautions   Precaution Comments No known precautions      Restrictions   Other Position/Activity Restrictions No known weight bearing restrictions      Balance Screen   Has the patient fallen in the past 6 months No    Has the patient had a decrease in activity level because of a fear of falling?  Yes    Is the patient reluctant to leave their home because of a fear of falling?  Yes      Home Environment   Additional Comments Pt lives in assisted living ([place]). One story home, no steps to enter.      Observation/Other Assessments   Observations surgical incision healed satisfactoriy.    Focus on Therapeutic Outcomes (FOTO)  R knee FOTO 20      Posture/Postural Control   Posture Comments B shoulder shrug, slight R lateral shift, L knee in slight flexion, L foot in pronation      AROM   Right Knee Extension -10    Right Knee Flexion 101      Strength   Right Hip Flexion 4/5    Right Hip Extension 4-/5   seated manually resisted   Right Hip ABduction 4/5   seated manually resisted clamshell   Left Hip Flexion 4-/5    Left Hip Extension 4/5   seated manually resisted   Left Hip ABduction 4-/5   seated manually resisted clamshell   Right Knee Flexion 4/5    Right Knee Extension 5/5    Left Knee Flexion 4+/5    Left Knee Extension 5/5      Palpation    Palpation comment Decreased scar tissue mobility      Ambulation/Gait   Gait Comments with rw: decreased stance  R LE, B pelvic drop.                        Objective measurements completed on examination: See above findings.    Has spinal cord stimulator.  No latex allergies BP is controlled.   Medbridge Access Code [id number]  Therapeutic exercise Seated R quad set, with heel on floor 10x5 seconds to promote knee extension ROM for 2 sets  Improved exercise technique, movement at target joints, use of target muscles after mod verbal, visual, tactile cues.     Manual therapy  Seated STM R scar tissue gentle to decrease stiffness  Seated R knee flexion improved to 104 degrees    Repsonse to treatment Pt tolerated session well without aggravation of symptoms.   Clinical Presentation Pt is a 65 year old female who came to physical therapy S/P R TKA on 02/05/2021. She also presents with altered gait pattern and posture, limited R knee ROM, hip weakness, decreased scar tissue mobility, swelling, and difficulty performing standing tasks as well as ambulating. Pt will benefit from skilled physical therapy services to address the aforementioned deficits.                  PT Education - 03/12/21 1812     Education provided Yes    Education Details ther-ex, HEP, POC    Person(s) Educated Patient    Methods Explanation;Demonstration;Tactile cues;Verbal cues;Handout    Comprehension Returned demonstration;Verbalized understanding              PT Short Term Goals - 03/12/21 1817       PT SHORT TERM GOAL #1   Title Pt will be independent with her initial HEP to improve ROM, strength, function, and ability to amblate with less difficulty.    Baseline Pt has started her HEP. (03/12/2021)    Time 3    Period Weeks    Status New    Target Date 04/05/21               PT Long Term Goals - 03/12/21 1819       PT LONG TERM GOAL #1   Title Pt  will improve R knee extension AROM to -5 degrees or less to promote ability to ambulate as well as perform standing tasks with less difficulty.    Baseline Seated L knee extension AROM -10 degrees (03/12/2021)    Time 8    Period Weeks    Status New    Target Date 05/10/21      PT LONG TERM GOAL #2   Title Pt will improve her R knee flexion AROM to 115 degrees or more to promote ability to perform transfers, negotiate stairs with less difficulty.    Baseline Seated R knee flexion AROM 101 degrees (03/12/2021)    Time 8    Period Weeks    Status New    Target Date 05/10/21      PT LONG TERM GOAL #3   Title Pt will improve R LE strength by at least 1/2 MMT grade to promote ability to perform standing tasks with less difficulty.    Baseline R LE hip flexion 4/5, abduction (sitting) 4/5, extension (sitting) 4-/5, knee flexion 4/5, knee extension 5/5 (03/12/2021)    Time 8    Period Weeks    Status New    Target Date 05/10/21      PT LONG TERM GOAL #4   Title Pt will be able to  ambulate wiht SPC at least 300 ft mod I to promote return to PLOF.    Baseline Pt currently ambulates with rw (03/12/2021)    Time 8    Period Weeks    Status New    Target Date 05/10/21      PT LONG TERM GOAL #5   Title Pt will improve her R knee FOTO score by at least 10 points as a demonstration of improved function.    Baseline R knee FOTO 20 (03/12/2021)    Time 8    Period Weeks    Status New    Target Date 05/10/21                    Plan - 03/12/21 1813     Clinical Impression Statement Pt is a 65 year old female who came to physical therapy S/P R TKA on 02/05/2021. She also presents with altered gait pattern and posture, limited R knee ROM, hip weakness, decreased scar tissue mobility, swelling, and difficulty performing standing tasks as well as ambulating. Pt will benefit from skilled physical therapy services to address the aforementioned deficits.    Personal Factors and Comorbidities  Comorbidity 3+;Age;Time since onset of injury/illness/exacerbation;Fitness;Past/Current Experience    Comorbidities Spinal chord stimulator, HTN, Depression, anxiety, COPD, DDD, fibromyalgia, obesity    Examination-Activity Limitations Locomotion Level;Stand;Lift;Transfers;Carry;Squat;Stairs    Stability/Clinical Decision Making Stable/Uncomplicated    Clinical Decision Making Low    Rehab Potential Fair    Clinical Impairments Affecting Rehab Potential (-) medical history; (+) motivated    PT Frequency 2x / week    PT Duration 8 weeks    PT Treatment/Interventions Neuromuscular re-education;Manual techniques;Aquatic Therapy;Electrical Stimulation;Iontophoresis 4mg /ml Dexamethasone;Gait training;Stair training;Functional mobility training;Therapeutic activities;Therapeutic exercise;Balance training;Patient/family education    PT Next Visit Plan ROM, hip and knee strengthening, manual techniques, modalities PRN, gait training    PT Home Exercise Plan Medbridge Access Code [email or web address]    Consulted and Agree with Plan of Care Patient             Patient will benefit from skilled therapeutic intervention in order to improve the following deficits and impairments:  Pain, Improper body mechanics, Postural dysfunction, Difficulty walking, Decreased strength, Decreased range of motion, Decreased balance  Visit Diagnosis: Chronic pain of right knee - Plan: PT plan of care cert/re-cert  Difficulty in walking, not elsewhere classified - Plan: PT plan of care cert/re-cert     Problem List Patient Active Problem List   Diagnosis Date Noted   Total knee replacement status 02/05/2021   Piriformis syndrome of both sides 01/02/2021   Chronic pain 08/07/2020   Chronic midline thoracic back pain 03/27/2020   Psoriasis 12/22/2019   Psoriatic arthritis ([place]) 12/22/2019   Multiple thyroid nodules 10/01/2019   Urticaria, chronic 07/14/2019   History of colon polyps 07/09/2019   Status post reverse  arthroplasty of shoulder, left 06/24/2019   History of lumbar laminectomy for spinal cord decompression (L3-L4, 2015) 02/25/2019   Lumbar radiculopathy 02/25/2019   Neutropenia ([place]) 02/16/2019   Primary osteoarthritis involving multiple joints 02/16/2019   Polyarthralgia 02/02/2019   Cervical spondylosis 09/14/2018   Tachycardia 08/18/2017   Status post total left knee replacement 08/11/2017   Chronic pain syndrome 07/30/2017   Fibromyalgia 07/30/2017   Pelvic pain in female 06/24/2017   Dysuria 06/24/2017   Generalized anxiety disorder 06/23/2017   Primary insomnia 06/23/2017   Lumbar spondylosis 06/23/2017   Stenosis of intervertebral foramina 06/23/2017   Right carpal tunnel syndrome 03/27/2017  HTN, goal below 140/90 03/05/2017   Graves' orbitopathy 02/17/2017   Primary osteoarthritis of right knee 10/18/2016   Bilateral arm weakness 07/16/2016   Spinal stenosis of lumbar region 03/22/2016   Chronic low back pain 02/13/2016   Depression, major, in remission ([place]) 12/21/2015   Back pain at L4-L5 level 10/30/2015   DDD (degenerative disc disease), lumbosacral 10/30/2015   Status post total replacement of right hip 08/21/2015   Leg pain 07/28/2015   Health care maintenance 06/21/2015   Impingement syndrome of shoulder, left 10/20/2014   Severe obesity (BMI 35.0-35.9 with comorbidity) ([place]) 08/09/2014   Atherosclerosis of abdominal aorta ([place]) 07/29/2014   DM II (diabetes mellitus, type II), controlled ([place]) 04/19/2014   Hemorrhoids 03/14/2014   Osteoporosis 02/03/2014   Migraines 02/03/2014   Hyperlipidemia, unspecified 02/03/2014   GERD (gastroesophageal reflux disease) 02/03/2014    Joneen Boers PT, DPT   03/12/2021, 6:42 PM  [facility] [address], [place], [address] Phone: [phone]   Fax:  [phone]  Name: Christine Higgins MRN: [id number] Date of Birth: Jan 01, 1956

## 2021-03-13 DIAGNOSIS — L4050 Arthropathic psoriasis, unspecified: Secondary | ICD-10-CM | POA: Diagnosis not present

## 2021-03-13 DIAGNOSIS — L409 Psoriasis, unspecified: Secondary | ICD-10-CM | POA: Diagnosis not present

## 2021-03-13 DIAGNOSIS — Z79899 Other long term (current) drug therapy: Secondary | ICD-10-CM | POA: Diagnosis not present

## 2021-03-13 DIAGNOSIS — R208 Other disturbances of skin sensation: Secondary | ICD-10-CM | POA: Diagnosis not present

## 2021-03-13 DIAGNOSIS — M797 Fibromyalgia: Secondary | ICD-10-CM | POA: Diagnosis not present

## 2021-03-13 DIAGNOSIS — R200 Anesthesia of skin: Secondary | ICD-10-CM | POA: Diagnosis not present

## 2021-03-13 DIAGNOSIS — G5601 Carpal tunnel syndrome, right upper limb: Secondary | ICD-10-CM | POA: Diagnosis not present

## 2021-03-13 DIAGNOSIS — M159 Polyosteoarthritis, unspecified: Secondary | ICD-10-CM | POA: Diagnosis not present

## 2021-03-13 DIAGNOSIS — G608 Other hereditary and idiopathic neuropathies: Secondary | ICD-10-CM | POA: Diagnosis not present

## 2021-03-14 ENCOUNTER — Ambulatory Visit: Payer: Medicare HMO

## 2021-03-14 ENCOUNTER — Other Ambulatory Visit: Payer: Self-pay | Admitting: Obstetrics and Gynecology

## 2021-03-14 DIAGNOSIS — F325 Major depressive disorder, single episode, in full remission: Secondary | ICD-10-CM | POA: Diagnosis not present

## 2021-03-14 DIAGNOSIS — I10 Essential (primary) hypertension: Secondary | ICD-10-CM | POA: Diagnosis not present

## 2021-03-14 DIAGNOSIS — M25561 Pain in right knee: Secondary | ICD-10-CM | POA: Diagnosis not present

## 2021-03-14 DIAGNOSIS — R262 Difficulty in walking, not elsewhere classified: Secondary | ICD-10-CM

## 2021-03-14 DIAGNOSIS — G8929 Other chronic pain: Secondary | ICD-10-CM | POA: Diagnosis not present

## 2021-03-14 DIAGNOSIS — E119 Type 2 diabetes mellitus without complications: Secondary | ICD-10-CM | POA: Diagnosis not present

## 2021-03-14 DIAGNOSIS — I700 Atherosclerosis of aorta: Secondary | ICD-10-CM | POA: Diagnosis not present

## 2021-03-14 DIAGNOSIS — E7800 Pure hypercholesterolemia, unspecified: Secondary | ICD-10-CM | POA: Diagnosis not present

## 2021-03-14 DIAGNOSIS — D708 Other neutropenia: Secondary | ICD-10-CM | POA: Diagnosis not present

## 2021-03-14 DIAGNOSIS — Z6835 Body mass index (BMI) 35.0-35.9, adult: Secondary | ICD-10-CM | POA: Diagnosis not present

## 2021-03-14 NOTE — Therapy (Signed)
[facility] [address] [place], [address] Phone: [phone]   Fax:  [phone]  Physical Therapy Treatment  Patient Details  Name: Christine Higgins MRN: [id number] Date of Birth: 21-Aug-1956 Referring Provider (PT): Tamala Julian, [place]   Encounter Date: 03/14/2021   PT End of Session - 03/14/21 1648     Visit Number 2    Number of Visits 17    Date for PT Re-Evaluation 05/10/21    Authorization Type [employer]/ [place] Medicaid    Authorization Time Period Cert  3/57/01-7/79/39; Insurance authorization 03/12/21-06/01/21 for 24 visits    PT Start Time 1633    PT Stop Time 1713    PT Time Calculation (min) 40 min    Equipment Utilized During Treatment Gait belt    Activity Tolerance Patient tolerated treatment well    Behavior During Therapy WFL for tasks assessed/performed             Past Medical History:  Diagnosis Date   Anxiety    Asthma    Breast mass    LEFT x 3 months per pt and palpated by physician   Cervical disc disease    Chronic pain syndrome    COPD (chronic obstructive pulmonary disease) (HCC)    Degenerative disc disease, lumbar    osteoarthritis   Depression    Fibromyalgia    Fibromyalgia    GERD (gastroesophageal reflux disease)    Glaucoma    Graves disease    Headache    migraines   Hemorrhoids    Hyperlipidemia    Hypertension    Hyperthyroidism    Lumbar disc disease    Obesity    Psoriatic arthritis ([place])    Thyroid disease     Past Surgical History:  Procedure Laterality Date   BACK SURGERY     sumbar   CARDIAC CATHETERIZATION     carpel tunn Right    carpel tunnel Left    CESAREAN SECTION     COLONOSCOPY     COLONOSCOPY WITH PROPOFOL N/A 10/20/2020   Procedure: COLONOSCOPY WITH PROPOFOL;  Surgeon: Lesly Rubenstein, MD;  Location: [facility] ENDOSCOPY;  Service: Endoscopy;  Laterality: N/A;   ESOPHAGOGASTRODUODENOSCOPY (EGD) WITH PROPOFOL N/A 06/26/2017    Procedure: ESOPHAGOGASTRODUODENOSCOPY (EGD) WITH PROPOFOL;  Surgeon: Lollie Sails, MD;  Location: [facility] ENDOSCOPY;  Service: Endoscopy;  Laterality: N/A;   EXCISION MORTON'S NEUROMA Left 02/05/2017   Procedure: EXCISION MORTON'S NEUROMA;  Surgeon: Samara Deist, DPM;  Location: [facility];  Service: Podiatry;  Laterality: Left;  iva with local   HAND SURGERY Right    scar tissue removal   JOINT REPLACEMENT     right hip arthroplasty 08/25/15   KNEE ARTHROPLASTY Left 08/11/2017   Procedure: COMPUTER ASSISTED TOTAL KNEE ARTHROPLASTY;  Surgeon: Dereck Leep, MD;  Location: [facility] ORS;  Service: Orthopedics;  Laterality: Left;   KNEE ARTHROPLASTY Right 02/05/2021   Procedure: COMPUTER ASSISTED TOTAL KNEE ARTHROPLASTY;  Surgeon: Dereck Leep, MD;  Location: [facility];  Service: Orthopedics;  Laterality: Right;   KNEE ARTHROSCOPY Right 07/02/2018   Procedure: ARTHROSCOPY KNEE, Medial and Lateral  Chondroplasty;  Surgeon: Dereck Leep, MD;  Location: [facility] OR[facility];  Service: Orthopedics;  Laterality: Right;   NECK SURGERY     "disk implant"   REVERSE SHOULDER ARTHROPLASTY Left 06/24/2019   Procedure: REVERSE SHOULDER ARTHROPLASTY;  Surgeon: Corky Mull, MD;  Location: [facility];  Service: Orthopedics;  Laterality:  Left;   SHOULDER SURGERY Right    spur removal   THORACIC LAMINECTOMY FOR SPINAL CORD STIMULATOR N/A 08/07/2020   Procedure: THORACIC SPINAL CORD STIMULATOR VIA LAMINECTOMY, PULSE GENERATOR;  Surgeon: [name], MD;  Location: [facility];  Service: Neurosurgery;  Laterality: N/A;   TOTAL HIP ARTHROPLASTY Right    TUBAL LIGATION      There were no vitals filed for this visit.   Subjective Assessment - 03/14/21 1637     Subjective Pt doing well. Still working on her HEP from [place] and OPPT eval.    Pertinent History S/P R TKA on 02/05/2021. Pt states running behind in PT. Started home health PT, then [facility] and was unable to go there due to ride situation.  Has L knee replaced around 2016. Pt was using as SPC on R side for her L LE sciatic symptoms prior to surgery. Sometimes holds SPC on L side due to R knee pain. Pt also twisted her R knee 2 weeks ago resulting in R medial knee pain which has made it go up to 8.5 to 9/10 recently.    Currently in Pain? Yes    Pain Score 7     Pain Location Knee             INTERVENTION THIS DATE: -seated RLE heel slides x2 minutes (slider)  -supine RLE heel slides x2 minutes (slider)  -SAQ 2x15 on bolster -hooklying bridge x15  -hooklying RLE marching  -STS hands free from elevated surface (25") 1x15   ROM: 6-112 degrees A/ROM Rt knee     PT Short Term Goals - 03/12/21 1817       PT SHORT TERM GOAL #1   Title Pt will be independent with her initial HEP to improve ROM, strength, function, and ability to amblate with less difficulty.    Baseline Pt has started her HEP. (03/12/2021)    Time 3    Period Weeks    Status New    Target Date 04/05/21               PT Long Term Goals - 03/12/21 1819       PT LONG TERM GOAL #1   Title Pt will improve R knee extension AROM to -5 degrees or less to promote ability to ambulate as well as perform standing tasks with less difficulty.    Baseline Seated L knee extension AROM -10 degrees (03/12/2021)    Time 8    Period Weeks    Status New    Target Date 05/10/21      PT LONG TERM GOAL #2   Title Pt will improve her R knee flexion AROM to 115 degrees or more to promote ability to perform transfers, negotiate stairs with less difficulty.    Baseline Seated R knee flexion AROM 101 degrees (03/12/2021)    Time 8    Period Weeks    Status New    Target Date 05/10/21      PT LONG TERM GOAL #3   Title Pt will improve R LE strength by at least 1/2 MMT grade to promote ability to perform standing tasks with less difficulty.    Baseline R LE hip flexion 4/5, abduction (sitting) 4/5, extension (sitting) 4-/5, knee flexion 4/5, knee extension 5/5  (03/12/2021)    Time 8    Period Weeks    Status New    Target Date 05/10/21      PT LONG TERM GOAL #4  Title Pt will be able to ambulate wiht SPC at least 300 ft mod I to promote return to PLOF.    Baseline Pt currently ambulates with rw (03/12/2021)    Time 8    Period Weeks    Status New    Target Date 05/10/21      PT LONG TERM GOAL #5   Title Pt will improve her R knee FOTO score by at least 10 points as a demonstration of improved function.    Baseline R knee FOTO 20 (03/12/2021)    Time 8    Period Weeks    Status New    Target Date 05/10/21                   Plan - 03/14/21 1654     Clinical Impression Statement Continued with current plan of care as laid out in evaluation and recent prior sessions. Pt remains motivated to advance progress toward goals. Rest breaks provided as needed, pt quick to ask when needed. Author maintains all interventions within appropriate level of intensity as not to purposefully exacerbate pain. ROM continues to look good, improved extension, flexion similar to eval visit. Pt does require varying levels of assistance and cuing for completion of exercises for correct form and sometimes due to pain/weakness. Pt continues to demonstrate progress toward goals AEB progression of some interventions this date either in volume or intensity. No updates to HEP this date.     Personal Factors and Comorbidities Comorbidity 3+;Age;Time since onset of injury/illness/exacerbation;Fitness;Past/Current Experience    Comorbidities Spinal chord stimulator, HTN, Depression, anxiety, COPD, DDD, fibromyalgia, obesity    Examination-Activity Limitations Locomotion Level;Stand;Lift;Transfers;Carry;Squat;Stairs    Stability/Clinical Decision Making Stable/Uncomplicated    Clinical Decision Making Low    Rehab Potential Fair    Clinical Impairments Affecting Rehab Potential (-) medical history; (+) motivated    PT Frequency 2x / week    PT Duration 8 weeks    PT  Treatment/Interventions Neuromuscular re-education;Manual techniques;Aquatic Therapy;Electrical Stimulation;Iontophoresis 4mg /ml Dexamethasone;Gait training;Stair training;Functional mobility training;Therapeutic activities;Therapeutic exercise;Balance training;Patient/family education    PT Next Visit Plan ROM, hip and knee strengthening, manual techniques, modalities PRN, gait training    PT Home Exercise Plan Medbridge Access Code [email or web address]    Consulted and Agree with Plan of Care Patient             Patient will benefit from skilled therapeutic intervention in order to improve the following deficits and impairments:  Pain, Improper body mechanics, Postural dysfunction, Difficulty walking, Decreased strength, Decreased range of motion, Decreased balance  Visit Diagnosis: Chronic pain of right knee  Difficulty in walking, not elsewhere classified     Problem List Patient Active Problem List   Diagnosis Date Noted   Total knee replacement status 02/05/2021   Piriformis syndrome of both sides 01/02/2021   Chronic pain 08/07/2020   Chronic midline thoracic back pain 03/27/2020   Psoriasis 12/22/2019   Psoriatic arthritis ([place]) 12/22/2019   Multiple thyroid nodules 10/01/2019   Urticaria, chronic 07/14/2019   History of colon polyps 07/09/2019   Status post reverse arthroplasty of shoulder, left 06/24/2019   History of lumbar laminectomy for spinal cord decompression (L3-L4, 2015) 02/25/2019   Lumbar radiculopathy 02/25/2019   Neutropenia ([place]) 02/16/2019   Primary osteoarthritis involving multiple joints 02/16/2019   Polyarthralgia 02/02/2019   Cervical spondylosis 09/14/2018   Tachycardia 08/18/2017   Status post total left knee replacement 08/11/2017   Chronic pain syndrome 07/30/2017   Fibromyalgia 07/30/2017  Pelvic pain in female 06/24/2017   Dysuria 06/24/2017   Generalized anxiety disorder 06/23/2017   Primary insomnia 06/23/2017   Lumbar spondylosis 06/23/2017    Stenosis of intervertebral foramina 06/23/2017   Right carpal tunnel syndrome 03/27/2017   HTN, goal below 140/90 03/05/2017   Graves' orbitopathy 02/17/2017   Primary osteoarthritis of right knee 10/18/2016   Bilateral arm weakness 07/16/2016   Spinal stenosis of lumbar region 03/22/2016   Chronic low back pain 02/13/2016   Depression, major, in remission ([place]) 12/21/2015   Back pain at L4-L5 level 10/30/2015   DDD (degenerative disc disease), lumbosacral 10/30/2015   Status post total replacement of right hip 08/21/2015   Leg pain 07/28/2015   Health care maintenance 06/21/2015   Impingement syndrome of shoulder, left 10/20/2014   Severe obesity (BMI 35.0-35.9 with comorbidity) ([place]) 08/09/2014   Atherosclerosis of abdominal aorta ([place]) 07/29/2014   DM II (diabetes mellitus, type II), controlled ([place]) 04/19/2014   Hemorrhoids 03/14/2014   Osteoporosis 02/03/2014   Migraines 02/03/2014   Hyperlipidemia, unspecified 02/03/2014   GERD (gastroesophageal reflux disease) 02/03/2014   5:03 PM, 03/14/21 Etta Grandchild, PT, DPT Physical Therapist - [place] [phone] (Office)   Ronella Plunk C 03/14/2021, 4:56 PM  [facility] [address], [place], [address] Phone: [phone]   Fax:  [phone]  Name: Christine Higgins MRN: [id number] Date of Birth: [birth year]

## 2021-03-19 ENCOUNTER — Other Ambulatory Visit: Payer: Self-pay

## 2021-03-19 ENCOUNTER — Encounter: Payer: Self-pay | Admitting: Student in an Organized Health Care Education/Training Program

## 2021-03-19 ENCOUNTER — Ambulatory Visit
Admission: RE | Admit: 2021-03-19 | Discharge: 2021-03-19 | Disposition: A | Discharge status: Home / Self Care | Payer: Medicare HMO | Source: Ambulatory Visit | Attending: Student in an Organized Health Care Education/Training Program | Admitting: Student in an Organized Health Care Education/Training Program

## 2021-03-19 ENCOUNTER — Ambulatory Visit (HOSPITAL_BASED_OUTPATIENT_CLINIC_OR_DEPARTMENT_OTHER): Payer: Medicare HMO | Admitting: Student in an Organized Health Care Education/Training Program

## 2021-03-19 DIAGNOSIS — G894 Chronic pain syndrome: Secondary | ICD-10-CM | POA: Diagnosis not present

## 2021-03-19 DIAGNOSIS — M961 Postlaminectomy syndrome, not elsewhere classified: Secondary | ICD-10-CM | POA: Diagnosis not present

## 2021-03-19 DIAGNOSIS — M545 Low back pain, unspecified: Secondary | ICD-10-CM | POA: Insufficient documentation

## 2021-03-19 DIAGNOSIS — M5416 Radiculopathy, lumbar region: Secondary | ICD-10-CM

## 2021-03-19 DIAGNOSIS — Z9889 Other specified postprocedural states: Secondary | ICD-10-CM | POA: Insufficient documentation

## 2021-03-19 DIAGNOSIS — M5137 Other intervertebral disc degeneration, lumbosacral region: Secondary | ICD-10-CM | POA: Diagnosis not present

## 2021-03-19 MED ORDER — DEXAMETHASONE SODIUM PHOSPHATE 10 MG/ML IJ SOLN
INTRAMUSCULAR | Status: AC
  Filled 2021-03-19: qty 1

## 2021-03-19 MED ORDER — LIDOCAINE HCL 2 % IJ SOLN
20.0000 mL | Freq: Once | INTRAMUSCULAR | Status: AC
  Administered 2021-03-19: 200 mg

## 2021-03-19 MED ORDER — TRAMADOL HCL 50 MG PO TABS: 100.0000 mg | ORAL_TABLET | Freq: Two times a day (BID) | ORAL | 1 refills | Status: DC | PRN

## 2021-03-19 MED ORDER — DEXAMETHASONE SODIUM PHOSPHATE 10 MG/ML IJ SOLN
10.0000 mg | Freq: Once | INTRAMUSCULAR | Status: AC
  Administered 2021-03-19: 10 mg

## 2021-03-19 MED ORDER — IOHEXOL 180 MG/ML  SOLN
10.0000 mL | Freq: Once | INTRAMUSCULAR | Status: AC
  Administered 2021-03-19: 10 mL via EPIDURAL

## 2021-03-19 MED ORDER — LIDOCAINE HCL 2 % IJ SOLN
INTRAMUSCULAR | Status: AC
  Filled 2021-03-19: qty 10

## 2021-03-19 MED ORDER — ROPIVACAINE HCL 2 MG/ML IJ SOLN
2.0000 mL | Freq: Once | INTRAMUSCULAR | Status: AC
  Administered 2021-03-19: 2 mL via EPIDURAL

## 2021-03-19 MED ORDER — ROPIVACAINE HCL 2 MG/ML IJ SOLN
INTRAMUSCULAR | Status: AC
  Filled 2021-03-19: qty 20

## 2021-03-19 MED ORDER — SODIUM CHLORIDE 0.9% FLUSH
2.0000 mL | Freq: Once | INTRAVENOUS | Status: AC
  Administered 2021-03-19: 2 mL

## 2021-03-19 MED ORDER — SODIUM CHLORIDE (PF) 0.9 % IJ SOLN
INTRAMUSCULAR | Status: AC
  Filled 2021-03-19: qty 10

## 2021-03-19 NOTE — Progress Notes (Signed)
PROVIDER NOTE: Information contained herein reflects review and annotations entered in association with encounter. Interpretation of such information and data should be left to medically-trained personnel. Information provided to patient can be located elsewhere in the medical record under "Patient Instructions". Document created using STT-dictation technology, any transcriptional errors that may result from process are unintentional.    Patient: Christine Higgins  Service Category: Procedure  Provider: [name], MD  DOB: [birth year]  DOS: 03/19/2021  Location: [facility]  MRN: [id number]  Setting: Ambulatory - outpatient  Referring Provider: [name], MD  Type: Established Patient  Specialty: Interventional Pain Management  PCP: Kirk Ruths, MD   Primary Reason for Visit: Interventional Pain Management Treatment. CC: Back Pain  Procedure:          Anesthesia, Analgesia, Anxiolysis:  Type: Palliative Epidural Steroid Injection          Region: Caudal Level: Sacrococcygeal   Laterality: Midline       Type: Local Anesthesia  Local Anesthetic: Lidocaine 1-2%  Position: Prone   Indications: 1. Lumbar radiculopathy   2. History of lumbar laminectomy for spinal cord decompression   3. Failed back surgical syndrome   4. Back pain at L4-L5 level   5. DDD (degenerative disc disease), lumbosacral   6. Chronic pain syndrome    Pain Score: Pre-procedure: 8 /10 Post-procedure: 0-No pain/10   Pre-op H&P Assessment:  [name] is a 65 y.o. (year old), female patient, seen today for interventional treatment. She  has a past surgical history that includes carpel tunn (Right); Hand surgery (Right); carpel tunnel (Left); Cesarean section; Shoulder surgery (Right); Back surgery; Neck surgery; Total hip arthroplasty (Right); Excision Morton's neuroma (Left, 02/05/2017); Colonoscopy; Joint replacement; Esophagogastroduodenoscopy (egd) with propofol (N/A, 06/26/2017); Knee  Arthroplasty (Left, 08/11/2017); Knee arthroscopy (Right, 07/02/2018); Reverse shoulder arthroplasty (Left, 06/24/2019); Thoracic laminectomy for spinal cord stimulator (N/A, 08/07/2020); Colonoscopy with propofol (N/A, 10/20/2020); Cardiac catheterization; Tubal ligation; and Knee Arthroplasty (Right, 02/05/2021). [name] has a current medication list which includes the following prescription(s): acetaminophen, vitamin c, atorvastatin, vitamin a, budesonide-formoterol, calcium carbonate, calcium-magnesium-vitamin d, celecoxib, celecoxib, vitamin d3, ciclopirox, clindamycin, clobetasol ointment, clonazepam, clotrimazole-betamethasone, coq-10, vitamin b-12, diphenhydramine, duloxetine, duloxetine, esomeprazole, fexofenadine, fluconazole, fluocinolone acetonide body, fluticasone, gabapentin, hydralazine, hydroxyzine, linaclotide, losartan-hydrochlorothiazide, menthol (topical analgesic), methimazole, methocarbamol, multiple vitamin, fish oil, oxycodone, probiotic product, propranolol, propylene glycol, sucralfate, terbinafine, trazodone, triamcinolone ointment, turmeric, vitamin e, enoxaparin, and [START ON 04/09/2021] tramadol. Her primarily concern today is the Back Pain  Initial Vital Signs:  Pulse/HCG Rate: 74  Temp: (!) 97.2 F (36.2 C) Resp: 18 BP: 134/80 SpO2: 100 %  BMI: Estimated body mass index is 35.01 kg/m as calculated from the following:   Height as of this encounter: '5\' 11"'$  (1.803 m).   Weight as of this encounter: 251 lb (113.9 kg).  Risk Assessment: Allergies: Reviewed. She is allergic to cephalexin, ibuprofen, potassium chloride, shellfish allergy, and aspirin.  Allergy Precautions: None required Coagulopathies: Reviewed. None identified.  Blood-thinner therapy: None at this time Active Infection(s): Reviewed. None identified. [name] is afebrile  Site Confirmation: Ms. [name] was asked to confirm the procedure and laterality before marking the site Procedure checklist:  Completed Consent: Before the procedure and under the influence of no sedative(s), amnesic(s), or anxiolytics, the patient was informed of the treatment options, risks and possible complications. To fulfill our ethical and legal obligations, as recommended by the American Medical Association's Code of Ethics, I have informed the patient of my clinical impression;  the nature and purpose of the treatment or procedure; the risks, benefits, and possible complications of the intervention; the alternatives, including doing nothing; the risk(s) and benefit(s) of the alternative treatment(s) or procedure(s); and the risk(s) and benefit(s) of doing nothing. The patient was provided information about the general risks and possible complications associated with the procedure. These may include, but are not limited to: failure to achieve desired goals, infection, bleeding, organ or nerve damage, allergic reactions, paralysis, and death. In addition, the patient was informed of those risks and complications associated to Spine-related procedures, such as failure to decrease pain; infection (i.e.: Meningitis, epidural or intraspinal abscess); bleeding (i.e.: epidural hematoma, subarachnoid hemorrhage, or any other type of intraspinal or peri-dural bleeding); organ or nerve damage (i.e.: Any type of peripheral nerve, nerve root, or spinal cord injury) with subsequent damage to sensory, motor, and/or autonomic systems, resulting in permanent pain, numbness, and/or weakness of one or several areas of the body; allergic reactions; (i.e.: anaphylactic reaction); and/or death. Furthermore, the patient was informed of those risks and complications associated with the medications. These include, but are not limited to: allergic reactions (i.e.: anaphylactic or anaphylactoid reaction(s)); adrenal axis suppression; blood sugar elevation that in diabetics may result in ketoacidosis or comma; water retention that in patients with history  of congestive heart failure may result in shortness of breath, pulmonary edema, and decompensation with resultant heart failure; weight gain; swelling or edema; medication-induced neural toxicity; particulate matter embolism and blood vessel occlusion with resultant organ, and/or nervous system infarction; and/or aseptic necrosis of one or more joints. Finally, the patient was informed that Medicine is not an exact science; therefore, there is also the possibility of unforeseen or unpredictable risks and/or possible complications that may result in a catastrophic outcome. The patient indicated having understood very clearly. We have given the patient no guarantees and we have made no promises. Enough time was given to the patient to ask questions, all of which were answered to the patient's satisfaction. Ms. Matsubara has indicated that she wanted to continue with the procedure. Attestation: I, the ordering provider, attest that I have discussed with the patient the benefits, risks, side-effects, alternatives, likelihood of achieving goals, and potential problems during recovery for the procedure that I have provided informed consent. Date  Time: 03/19/2021 12:28 PM  Pre-Procedure Preparation:  Monitoring: As per clinic protocol. Respiration, ETCO2, SpO2, BP, heart rate and rhythm monitor placed and checked for adequate function Safety Precautions: Patient was assessed for positional comfort and pressure points before starting the procedure. Time-out: I initiated and conducted the "Time-out" before starting the procedure, as per protocol. The patient was asked to participate by confirming the accuracy of the "Time Out" information. Verification of the correct person, site, and procedure were performed and confirmed by me, the nursing staff, and the patient. "Time-out" conducted as per Joint Commission's Universal Protocol (UP.01.01.01). Time: 1257  Description of Procedure:          Target Area: Caudal  Epidural Canal. Approach: Midline approach. Area Prepped: Entire Posterior Sacrococcygeal Region DuraPrep (Iodine Povacrylex [0.7% available iodine] and Isopropyl Alcohol, 74% w/w) Safety Precautions: Aspiration looking for blood return was conducted prior to all injections. At no point did we inject any substances, as a needle was being advanced. No attempts were made at seeking any paresthesias. Safe injection practices and needle disposal techniques used. Medications properly checked for expiration dates. SDV (single dose vial) medications used. Description of the Procedure: Protocol guidelines were followed. The patient was  placed in position over the fluoroscopy table. The target area was identified and the area prepped in the usual manner. Skin & deeper tissues infiltrated with local anesthetic. Appropriate amount of time allowed to pass for local anesthetics to take effect. The procedure needles were then advanced to the target area. Proper needle placement secured. Negative aspiration confirmed. Solution injected in intermittent fashion, asking for systemic symptoms every 0.5cc of injectate. The needles were then removed and the area cleansed, making sure to leave some of the prepping solution back to take advantage of its long term bactericidal properties. Vitals:   03/19/21 1230 03/19/21 1256 03/19/21 1301  BP: 134/80 (!) 138/96 (!) 139/92  Pulse: 74 72 72  Resp: '18 13 16  '$ Temp: (!) 97.2 F (36.2 C)    TempSrc: Temporal    SpO2: 100% 100% 99%  Weight: 251 lb (113.9 kg)    Height: '5\' 11"'$  (1.803 m)      Start Time: 1257 hrs. End Time: 1301 hrs. Materials:  Needle(s) Type: Epidural needle Gauge: 22G Length: 3.5-in Medication(s): Please see orders for medications and dosing details. 6 cc solution made of 3 cc of preservative-free saline, 2 cc of 0.2% ropivacaine, 1 cc of Decadron 10 mg/cc.  Imaging Guidance (Spinal):          Type of Imaging Technique: Fluoroscopy Guidance  (Spinal) Indication(s): Assistance in needle guidance and placement for procedures requiring needle placement in or near specific anatomical locations not easily accessible without such assistance. Exposure Time: Please see nurses notes. Contrast: Before injecting any contrast, we confirmed that the patient did not have an allergy to iodine, shellfish, or radiological contrast. Once satisfactory needle placement was completed at the desired level, radiological contrast was injected. Contrast injected under live fluoroscopy. No contrast complications. See chart for type and volume of contrast used. Fluoroscopic Guidance: I was personally present during the use of fluoroscopy. "Tunnel Vision Technique" used to obtain the best possible view of the target area. Parallax error corrected before commencing the procedure. "Direction-depth-direction" technique used to introduce the needle under continuous pulsed fluoroscopy. Once target was reached, antero-posterior, oblique, and lateral fluoroscopic projection used confirm needle placement in all planes. Images permanently stored in EMR. Interpretation: I personally interpreted the imaging intraoperatively. Adequate needle placement confirmed in multiple planes. Appropriate spread of contrast into desired area was observed. No evidence of afferent or efferent intravascular uptake. No intrathecal or subarachnoid spread observed. Permanent images saved into the patient's record.  Post-operative Assessment:  Post-procedure Vital Signs:  Pulse/HCG Rate: 72  Temp: (!) 97.2 F (36.2 C) Resp: 16 BP: (!) 139/92 SpO2: 99 %  EBL: None  Complications: No immediate post-treatment complications observed by team, or reported by patient.  Note: The patient tolerated the entire procedure well. A repeat set of vitals were taken after the procedure and the patient was kept under observation following institutional policy, for this type of procedure. Post-procedural  neurological assessment was performed, showing return to baseline, prior to discharge. The patient was provided with post-procedure discharge instructions, including a section on how to identify potential problems. Should any problems arise concerning this procedure, the patient was given instructions to immediately contact us, at any time, without hesitation. In any case, we plan to contact the patient by telephone for a follow-up status report regarding this interventional procedure.  Comments:  No additional relevant information.  Plan of Care  Orders:  Orders Placed This Encounter  Procedures   DG PAIN [facility] C-ARM 1-60 MIN NO REPORT  Intraoperative interpretation by procedural physician at [place].    Standing Status:   Standing    Number of Occurrences:   1    Order Specific Question:   Reason for exam:    Answer:   Assistance in needle guidance and placement for procedures requiring needle placement in or near specific anatomical locations not easily accessible without such assistance.   Chronic Opioid Analgesic:  Tramadol 50 mg 3 times daily as needed, quantity 90/month; MME equals 15    Pharmacotherapy Assessment  Analgesic: Tramadol 50 mg 3 times daily as needed, quantity 90/month; MME equals 15   Monitoring: [place] PMP: PDMP reviewed during this encounter.       Pharmacotherapy: No side-effects or adverse reactions reported. Compliance: No problems identified. Effectiveness: Clinically acceptable.  UDS:  Summary  Date Value Ref Range Status  09/29/2019 Note  Final    Comment:    ==================================================================== Compliance Drug Analysis, Ur ==================================================================== Test                             Result       Flag       Units Drug Present and Declared for Prescription Verification   7-aminoclonazepam              65           EXPECTED   ng/mg creat    7-aminoclonazepam is an  expected metabolite of clonazepam. Source of    clonazepam is a scheduled prescription medication.   Tramadol                       >4310        EXPECTED   ng/mg creat   O-Desmethyltramadol            >4310        EXPECTED   ng/mg creat   N-Desmethyltramadol            3447         EXPECTED   ng/mg creat    Source of tramadol is a prescription medication. O-desmethyltramadol    and N-desmethyltramadol are expected metabolites of tramadol.   Gabapentin                     PRESENT      EXPECTED   Cyclobenzaprine                PRESENT      EXPECTED   Desmethylcyclobenzaprine       PRESENT      EXPECTED    Desmethylcyclobenzaprine is an expected metabolite of    cyclobenzaprine.   Duloxetine                     PRESENT      EXPECTED   Trazodone                      PRESENT      EXPECTED   1,3 chlorophenyl piperazine    PRESENT      EXPECTED    1,3-chlorophenyl piperazine is an expected metabolite of trazodone.   Diphenhydramine                PRESENT      EXPECTED   Propranolol  PRESENT      EXPECTED Drug Absent but Declared for Prescription Verification   Oxycodone                      Not Detected UNEXPECTED ng/mg creat   Acetaminophen                  Not Detected UNEXPECTED    Acetaminophen, as indicated in the declared medication list, is not    always detected even when used as directed.   Hydroxyzine                    Not Detected UNEXPECTED ==================================================================== Test                      Result    Flag   Units      Ref Range   Creatinine              116              mg/dL      >=20 ==================================================================== Declared Medications:  The flagging and interpretation on this report are based on the  following declared medications.  Unexpected results may arise from  inaccuracies in the declared medications.  **Note: The testing scope of this panel includes these medications:   Clonazepam  Cyclobenzaprine (Flexeril)  Diphenhydramine (Benadryl)  Duloxetine (Cymbalta)  Gabapentin (Neurontin)  Hydroxyzine  Oxycodone (Roxicodone)  Propranolol (Inderal)  Tramadol (Ultram)  Trazodone (Desyrel)  **Note: The testing scope of this panel does not include small to  moderate amounts of these reported medications:  Acetaminophen  **Note: The testing scope of this panel does not include the  following reported medications:  Apixaban  Atorvastatin (Lipitor)  Betamethasone (Lotrisone)  Budesonide (Symbicort)  Calcium  Ciprofloxacin (Cipro)  Clobetasol  Clotrimazole (Lotrisone)  Esomeprazole (Nexium)  Eye Drops  Formoterol (Symbicort)  Hydralazine (Apresoline)  Hydrochlorothiazide (Hyzaar)  Iron  Losartan (Hyzaar)  Menthol  Multivitamin  Probiotic  Sodium Chloride  Sucralfate (Carafate)  Turmeric  Ubiquinone (CoQ10)  Vitamin B12  Vitamin C  Vitamin D3  Vitamin E ==================================================================== For clinical consultation, please call [phone]. ====================================================================      Medications ordered for procedure: Meds ordered this encounter  Medications   iohexol (OMNIPAQUE) 180 MG/ML injection 10 mL    Must be Myelogram-compatible. If not available, you may substitute with a water-soluble, non-ionic, hypoallergenic, myelogram-compatible radiological contrast medium.   lidocaine (XYLOCAINE) 2 % (with pres) injection 400 mg   sodium chloride flush (NS) 0.9 % injection 2 mL   ropivacaine (PF) 2 mg/mL (0.2%) (NAROPIN) injection 2 mL   dexamethasone (DECADRON) injection 10 mg   traMADol (ULTRAM) 50 MG tablet    Sig: Take 2 tablets (100 mg total) by mouth every 12 (twelve) hours as needed for severe pain.    Dispense:  120 tablet    Refill:  1    Fill one day early if pharmacy is closed on scheduled refill date.   Medications administered: We administered iohexol,  lidocaine, sodium chloride flush, ropivacaine (PF) 2 mg/mL (0.2%), and dexamethasone.  See the medical record for exact dosing, route, and time of administration.  Follow-up plan:   Return in about 8 weeks (around 05/14/2021) for Medication Management, Post Procedure Evaluation, in person.    Recent Visits Date Type Provider Dept  02/27/21 Telemedicine [name], MD [facility]  02/19/21 Telemedicine [name], MD [facility]  01/15/21 Procedure  visit [name], MD [facility]  01/02/21 Telemedicine [name], MD [facility]  Showing recent visits within past 90 days and meeting all other requirements Today's Visits Date Type Provider Dept  03/19/21 Procedure visit [name], MD [facility]  Showing today's visits and meeting all other requirements Future Appointments Date Type Provider Dept  04/17/21 Appointment [name], MD [facility]  Showing future appointments within next 90 days and meeting all other requirements Disposition: Discharge home  Discharge (Date  Time): 03/19/2021; 1308 hrs.   Primary Care Physician: Kirk Ruths, MD Location: [facility] Note by: [name], MD Date: 03/19/2021; Time: 1:26 PM  Disclaimer:  Medicine is not an Chief Strategy Officer. The only guarantee in medicine is that nothing is guaranteed. It is important to note that the decision to proceed with this intervention was based on the information collected from the patient. The Data and conclusions were drawn from the patient's questionnaire, the interview, and the physical examination. Because the information was provided in large part by the patient, it cannot be guaranteed that it has not been purposely or unconsciously manipulated. Every effort has been made to obtain as much relevant data as possible for this evaluation. It is important to note that the conclusions that lead to this  procedure are derived in large part from the available data. Always take into account that the treatment will also be dependent on availability of resources and existing treatment guidelines, considered by other Pain Management Practitioners as being common knowledge and practice, at the time of the intervention. For Medico-Legal purposes, it is also important to point out that variation in procedural techniques and pharmacological choices are the acceptable norm. The indications, contraindications, technique, and results of the above procedure should only be interpreted and judged by a Board-Certified Interventional Pain Specialist with extensive familiarity and expertise in the same exact procedure and technique.

## 2021-03-19 NOTE — Progress Notes (Signed)
Safety precautions to be maintained throughout the outpatient stay will include: orient to surroundings, keep bed in low position, maintain call bell within reach at all times, provide assistance with transfer out of bed and ambulation.  

## 2021-03-20 ENCOUNTER — Telehealth: Payer: Self-pay

## 2021-03-20 DIAGNOSIS — Z96651 Presence of right artificial knee joint: Secondary | ICD-10-CM | POA: Diagnosis not present

## 2021-03-20 NOTE — Telephone Encounter (Signed)
Post procedure phone call.  LM 

## 2021-03-25 DIAGNOSIS — Z96651 Presence of right artificial knee joint: Secondary | ICD-10-CM | POA: Insufficient documentation

## 2021-03-26 ENCOUNTER — Ambulatory Visit: Payer: Medicare HMO | Attending: Orthopedic Surgery

## 2021-03-26 DIAGNOSIS — R262 Difficulty in walking, not elsewhere classified: Secondary | ICD-10-CM | POA: Diagnosis not present

## 2021-03-26 DIAGNOSIS — M25561 Pain in right knee: Secondary | ICD-10-CM | POA: Diagnosis not present

## 2021-03-26 DIAGNOSIS — G8929 Other chronic pain: Secondary | ICD-10-CM

## 2021-03-26 NOTE — Therapy (Signed)
[facility] [address] [place], [address] Phone: [phone]   Fax:  [phone]  Physical Therapy Treatment  Patient Details  Name: Christine Higgins MRN: [id number] Date of Birth: 23-Jan-1956 Referring Provider (PT): Tamala Julian, [place]   Encounter Date: 03/26/2021   PT End of Session - 03/26/21 1636     Visit Number 3    Number of Visits 17    Date for PT Re-Evaluation 05/10/21    Authorization Type [employer]/ [place] [employer]    Authorization Time Period Cert  123456; Insurance authorization 03/12/21-06/01/21 for 24 visits    PT Start Time 1636    PT Stop Time 1723    PT Time Calculation (min) 47 min    Equipment Utilized During Treatment Gait belt    Activity Tolerance Patient tolerated treatment well    Behavior During Therapy WFL for tasks assessed/performed             Past Medical History:  Diagnosis Date   Anxiety    Asthma    Breast mass    LEFT x 3 months per pt and palpated by physician   Cervical disc disease    Chronic pain syndrome    COPD (chronic obstructive pulmonary disease) (HCC)    Degenerative disc disease, lumbar    osteoarthritis   Depression    Fibromyalgia    Fibromyalgia    GERD (gastroesophageal reflux disease)    Glaucoma    Graves disease    Headache    migraines   Hemorrhoids    Hyperlipidemia    Hypertension    Hyperthyroidism    Lumbar disc disease    Obesity    Psoriatic arthritis ([place])    Thyroid disease     Past Surgical History:  Procedure Laterality Date   BACK SURGERY     sumbar   CARDIAC CATHETERIZATION     carpel tunn Right    carpel tunnel Left    CESAREAN SECTION     COLONOSCOPY     COLONOSCOPY WITH PROPOFOL N/A 10/20/2020   Procedure: COLONOSCOPY WITH PROPOFOL;  Surgeon: Lesly Rubenstein, MD;  Location: [facility];  Service: Endoscopy;  Laterality: N/A;   ESOPHAGOGASTRODUODENOSCOPY (EGD) WITH PROPOFOL N/A 06/26/2017    Procedure: ESOPHAGOGASTRODUODENOSCOPY (EGD) WITH PROPOFOL;  Surgeon: Lollie Sails, MD;  Location: [facility] ENDOSCOPY;  Service: Endoscopy;  Laterality: N/A;   EXCISION MORTON'S NEUROMA Left 02/05/2017   Procedure: EXCISION MORTON'S NEUROMA;  Surgeon: Samara Deist, DPM;  Location: [facility];  Service: Podiatry;  Laterality: Left;  iva with local   HAND SURGERY Right    scar tissue removal   JOINT REPLACEMENT     right hip arthroplasty 08/25/15   KNEE ARTHROPLASTY Left 08/11/2017   Procedure: COMPUTER ASSISTED TOTAL KNEE ARTHROPLASTY;  Surgeon: Dereck Leep, MD;  Location: [facility];  Service: Orthopedics;  Laterality: Left;   KNEE ARTHROPLASTY Right 02/05/2021   Procedure: COMPUTER ASSISTED TOTAL KNEE ARTHROPLASTY;  Surgeon: Dereck Leep, MD;  Location: [facility];  Service: Orthopedics;  Laterality: Right;   KNEE ARTHROSCOPY Right 07/02/2018   Procedure: ARTHROSCOPY KNEE, Medial and Lateral  Chondroplasty;  Surgeon: Dereck Leep, MD;  Location: [facility];  Service: Orthopedics;  Laterality: Right;   NECK SURGERY     "disk implant"   REVERSE SHOULDER ARTHROPLASTY Left 06/24/2019   Procedure: REVERSE SHOULDER ARTHROPLASTY;  Surgeon: Corky Mull, MD;  Location: [facility];  Service: Orthopedics;  Laterality:  Left;   SHOULDER SURGERY Right    spur removal   THORACIC LAMINECTOMY FOR SPINAL CORD STIMULATOR N/A 08/07/2020   Procedure: THORACIC SPINAL CORD STIMULATOR VIA LAMINECTOMY, PULSE GENERATOR;  Surgeon: [name], MD;  Location: [facility];  Service: Neurosurgery;  Laterality: N/A;   TOTAL HIP ARTHROPLASTY Right    TUBAL LIGATION      There were no vitals filed for this visit.   Subjective Assessment - 03/26/21 1639     Subjective R knee is doing ok, its coming a long.    Pertinent History S/P R TKA on 02/05/2021. Pt states running behind in PT. Started home health PT, then [facility] and was unable to go there due to ride situation. Has L knee replaced around  2016. Pt was using as SPC on R side for her L LE sciatic symptoms prior to surgery. Sometimes holds SPC on L side due to R knee pain. Pt also twisted her R knee 2 weeks ago resulting in R medial knee pain which has made it go up to 8.5 to 9/10 recently.    Currently in Pain? Yes    Pain Score 4    3.5/10 currently                                     Objective    Has spinal cord stimulator. No latex allergies BP is controlled.    Medbridge Access Code [email or web address]   Therapeutic exercise  Seated knee flexion AROM 110 degrees   Seated R knee flexion AAROM 10x3 with PT   Seated manually resisted clamshell isometrics 10x5 seconds   Then with strap 10x5 seconds for 2 sets   Seated R quad set, with heel on floor 10x5 seconds to promote knee extension ROM   Seated hip adduction folded pillow squeeze 10x5 seconds for 2 sets  Decreased R medial hamstrings soreness  -STS hands free from elevated surface (25") 1x15  Cues for femoral control and more even weight distribution.      Improved exercise technique, movement at target joints, use of target muscles after mod verbal, visual, tactile cues.        Manual therapy  Seated STM R scar tissue gentle to decrease stiffness   Seated STM R quadriceps to decrease muscle tension   Seated STM R medial hamstrings to decrease tension        Repsonse to treatment Pt tolerated session well without aggravation of symptoms.   Clinical Presentation Improved R knee AROM since eval. Continued working on decreasing soft tissue restrictions and joint stiffness as well as improving glute and quad strength to promote ability to ambulate with less difficulty. Pt tolerated session well without aggravation of symptoms. Pt will benefit from skilled physical therapy services to improve ROM, strength and function. Time taken to fill out note for pt's asisted living facility.         PT Education - 03/26/21 1827      Education provided Yes    Education Details ther-ex    [employer]) Educated Patient    Methods Explanation;Demonstration;Tactile cues;Verbal cues    Comprehension Returned demonstration;Verbalized understanding              PT Short Term Goals - 03/12/21 1817       PT SHORT TERM GOAL #1   Title Pt will be independent with her initial HEP to improve ROM, strength, function, and  ability to amblate with less difficulty.    Baseline Pt has started her HEP. (03/12/2021)    Time 3    Period Weeks    Status New    Target Date 04/05/21               PT Long Term Goals - 03/12/21 1819       PT LONG TERM GOAL #1   Title Pt will improve R knee extension AROM to -5 degrees or less to promote ability to ambulate as well as perform standing tasks with less difficulty.    Baseline Seated L knee extension AROM -10 degrees (03/12/2021)    Time 8    Period Weeks    Status New    Target Date 05/10/21      PT LONG TERM GOAL #2   Title Pt will improve her R knee flexion AROM to 115 degrees or more to promote ability to perform transfers, negotiate stairs with less difficulty.    Baseline Seated R knee flexion AROM 101 degrees (03/12/2021)    Time 8    Period Weeks    Status New    Target Date 05/10/21      PT LONG TERM GOAL #3   Title Pt will improve R LE strength by at least 1/2 MMT grade to promote ability to perform standing tasks with less difficulty.    Baseline R LE hip flexion 4/5, abduction (sitting) 4/5, extension (sitting) 4-/5, knee flexion 4/5, knee extension 5/5 (03/12/2021)    Time 8    Period Weeks    Status New    Target Date 05/10/21      PT LONG TERM GOAL #4   Title Pt will be able to ambulate wiht SPC at least 300 ft mod I to promote return to PLOF.    Baseline Pt currently ambulates with rw (03/12/2021)    Time 8    Period Weeks    Status New    Target Date 05/10/21      PT LONG TERM GOAL #5   Title Pt will improve her R knee FOTO score by at least 10 points  as a demonstration of improved function.    Baseline R knee FOTO 20 (03/12/2021)    Time 8    Period Weeks    Status New    Target Date 05/10/21                   Plan - 03/26/21 1828     Clinical Impression Statement Improved R knee AROM since eval. Continued working on decreasing soft tissue restrictions and joint stiffness as well as improving glute and quad strength to promote ability to ambulate with less difficulty. Pt tolerated session well without aggravation of symptoms. Pt will benefit from skilled physical therapy services to improve ROM, strength and function. Time taken to fill out note for pt's asisted living facility.    Personal Factors and Comorbidities Comorbidity 3+;Age;Time since onset of injury/illness/exacerbation;Fitness;Past/Current Experience    Comorbidities Spinal chord stimulator, HTN, Depression, anxiety, COPD, DDD, fibromyalgia, obesity    Examination-Activity Limitations Locomotion Level;Stand;Lift;Transfers;Carry;Squat;Stairs    Stability/Clinical Decision Making Stable/Uncomplicated    Clinical Decision Making Low    Rehab Potential Fair    Clinical Impairments Affecting Rehab Potential (-) medical history; (+) motivated    PT Frequency 2x / week    PT Duration 8 weeks    PT Treatment/Interventions Neuromuscular re-education;Manual techniques;Aquatic Therapy;Electrical Stimulation;Iontophoresis '4mg'$ /ml Dexamethasone;Gait training;Stair training;Functional mobility training;Therapeutic activities;Therapeutic exercise;Balance  training;Patient/family education    PT Next Visit Plan ROM, hip and knee strengthening, manual techniques, modalities PRN, gait training    PT Home Exercise Plan Medbridge Access Code [email or web address]    Consulted and Agree with Plan of Care Patient             Patient will benefit from skilled therapeutic intervention in order to improve the following deficits and impairments:  Pain, Improper body mechanics, Postural dysfunction,  Difficulty walking, Decreased strength, Decreased range of motion, Decreased balance  Visit Diagnosis: Chronic pain of right knee  Difficulty in walking, not elsewhere classified     Problem List Patient Active Problem List   Diagnosis Date Noted   Total knee replacement status 02/05/2021   Piriformis syndrome of both sides 01/02/2021   Chronic pain 08/07/2020   Chronic midline thoracic back pain 03/27/2020   Psoriasis 12/22/2019   Psoriatic arthritis ([place]) 12/22/2019   Multiple thyroid nodules 10/01/2019   Urticaria, chronic 07/14/2019   History of colon polyps 07/09/2019   Status post reverse arthroplasty of shoulder, left 06/24/2019   History of lumbar laminectomy for spinal cord decompression (L3-L4, 2015) 02/25/2019   Lumbar radiculopathy 02/25/2019   Neutropenia ([place]) 02/16/2019   Primary osteoarthritis involving multiple joints 02/16/2019   Polyarthralgia 02/02/2019   Cervical spondylosis 09/14/2018   Tachycardia 08/18/2017   Status post total left knee replacement 08/11/2017   Chronic pain syndrome 07/30/2017   Fibromyalgia 07/30/2017   Pelvic pain in female 06/24/2017   Dysuria 06/24/2017   Generalized anxiety disorder 06/23/2017   Primary insomnia 06/23/2017   Lumbar spondylosis 06/23/2017   Stenosis of intervertebral foramina 06/23/2017   Right carpal tunnel syndrome 03/27/2017   HTN, goal below 140/90 03/05/2017   Graves' orbitopathy 02/17/2017   Primary osteoarthritis of right knee 10/18/2016   Bilateral arm weakness 07/16/2016   Spinal stenosis of lumbar region 03/22/2016   Chronic low back pain 02/13/2016   Depression, major, in remission ([place]) 12/21/2015   Back pain at L4-L5 level 10/30/2015   DDD (degenerative disc disease), lumbosacral 10/30/2015   Status post total replacement of right hip 08/21/2015   Leg pain 07/28/2015   Health care maintenance 06/21/2015   Impingement syndrome of shoulder, left 10/20/2014   Severe obesity (BMI 35.0-35.9 with  comorbidity) ([place]) 08/09/2014   Atherosclerosis of abdominal aorta ([place]) 07/29/2014   DM II (diabetes mellitus, type II), controlled ([place]) 04/19/2014   Hemorrhoids 03/14/2014   Osteoporosis 02/03/2014   Migraines 02/03/2014   Hyperlipidemia, unspecified 02/03/2014   GERD (gastroesophageal reflux disease) 02/03/2014    Joneen Boers PT, DPT   03/26/2021, 6:32 PM  [facility] [address] [place], [address] Phone: [phone]   Fax:  [phone]  Name: ZARIYA LIMBERG MRN: [id number] Date of Birth: 15-Oct-1955

## 2021-03-27 ENCOUNTER — Other Ambulatory Visit: Payer: Self-pay | Admitting: Neurosurgery

## 2021-03-27 DIAGNOSIS — J449 Chronic obstructive pulmonary disease, unspecified: Secondary | ICD-10-CM | POA: Diagnosis not present

## 2021-03-27 DIAGNOSIS — M419 Scoliosis, unspecified: Secondary | ICD-10-CM | POA: Diagnosis not present

## 2021-03-27 DIAGNOSIS — G894 Chronic pain syndrome: Secondary | ICD-10-CM | POA: Diagnosis not present

## 2021-03-27 DIAGNOSIS — R0609 Other forms of dyspnea: Secondary | ICD-10-CM | POA: Diagnosis not present

## 2021-03-27 DIAGNOSIS — J310 Chronic rhinitis: Secondary | ICD-10-CM | POA: Diagnosis not present

## 2021-03-27 DIAGNOSIS — K219 Gastro-esophageal reflux disease without esophagitis: Secondary | ICD-10-CM | POA: Diagnosis not present

## 2021-03-27 DIAGNOSIS — M47814 Spondylosis without myelopathy or radiculopathy, thoracic region: Secondary | ICD-10-CM | POA: Diagnosis not present

## 2021-03-28 ENCOUNTER — Ambulatory Visit: Payer: Medicare HMO

## 2021-03-28 DIAGNOSIS — M25561 Pain in right knee: Secondary | ICD-10-CM

## 2021-03-28 DIAGNOSIS — G8929 Other chronic pain: Secondary | ICD-10-CM

## 2021-03-28 DIAGNOSIS — R262 Difficulty in walking, not elsewhere classified: Secondary | ICD-10-CM | POA: Diagnosis not present

## 2021-03-28 NOTE — Therapy (Signed)
[facility] [address], [place], [address] Phone: [phone]   Fax:  [phone]  Physical Therapy Treatment  Patient Details  Name: Christine Higgins MRN: [id number] Date of Birth: 1956/03/13 Referring Provider (PT): Tamala Julian, [place]   Encounter Date: 03/28/2021   PT End of Session - 03/28/21 1636     Visit Number 4    Number of Visits 17    Date for PT Re-Evaluation 05/10/21    Authorization Type [employer]/ [place] Medicaid    Authorization Time Period Cert  123456; Insurance authorization 03/12/21-06/01/21 for 24 visits    PT Start Time 1636    PT Stop Time [id number]    PT Time Calculation (min) 39 min    Equipment Utilized During Treatment Gait belt    Activity Tolerance Patient tolerated treatment well    Behavior During Therapy WFL for tasks assessed/performed             Past Medical History:  Diagnosis Date   Anxiety    Asthma    Breast mass    LEFT x 3 months per pt and palpated by physician   Cervical disc disease    Chronic pain syndrome    COPD (chronic obstructive pulmonary disease) (HCC)    Degenerative disc disease, lumbar    osteoarthritis   Depression    Fibromyalgia    Fibromyalgia    GERD (gastroesophageal reflux disease)    Glaucoma    Graves disease    Headache    migraines   Hemorrhoids    Hyperlipidemia    Hypertension    Hyperthyroidism    Lumbar disc disease    Obesity    Psoriatic arthritis ([place])    Thyroid disease     Past Surgical History:  Procedure Laterality Date   BACK SURGERY     sumbar   CARDIAC CATHETERIZATION     carpel tunn Right    carpel tunnel Left    CESAREAN SECTION     COLONOSCOPY     COLONOSCOPY WITH PROPOFOL N/A 10/20/2020   Procedure: COLONOSCOPY WITH PROPOFOL;  Surgeon: Lesly Rubenstein, MD;  Location: [facility];  Service: Endoscopy;  Laterality: N/A;   ESOPHAGOGASTRODUODENOSCOPY (EGD) WITH PROPOFOL N/A 06/26/2017    Procedure: ESOPHAGOGASTRODUODENOSCOPY (EGD) WITH PROPOFOL;  Surgeon: Lollie Sails, MD;  Location: [facility]COPY;  Service: Endoscopy;  Laterality: N/A;   EXCISION MORTON'S NEUROMA Left 02/05/2017   Procedure: EXCISION MORTON'S NEUROMA;  Surgeon: Samara Deist, DPM;  Location: [facility];  Service: Podiatry;  Laterality: Left;  iva with local   HAND SURGERY Right    scar tissue removal   JOINT REPLACEMENT     right hip arthroplasty 08/25/15   KNEE ARTHROPLASTY Left 08/11/2017   Procedure: COMPUTER ASSISTED TOTAL KNEE ARTHROPLASTY;  Surgeon: Dereck Leep, MD;  Location: [facility];  Service: Orthopedics;  Laterality: Left;   KNEE ARTHROPLASTY Right 02/05/2021   Procedure: COMPUTER ASSISTED TOTAL KNEE ARTHROPLASTY;  Surgeon: Dereck Leep, MD;  Location: [facility];  Service: Orthopedics;  Laterality: Right;   KNEE ARTHROSCOPY Right 07/02/2018   Procedure: ARTHROSCOPY KNEE, Medial and Lateral  Chondroplasty;  Surgeon: Dereck Leep, MD;  Location: [facility];  Service: Orthopedics;  Laterality: Right;   NECK SURGERY     "disk implant"   REVERSE SHOULDER ARTHROPLASTY Left 06/24/2019   Procedure: REVERSE SHOULDER ARTHROPLASTY;  Surgeon: Corky Mull, MD;  Location: [facility];  Service: Orthopedics;  Laterality:  Left;   SHOULDER SURGERY Right    spur removal   THORACIC LAMINECTOMY FOR SPINAL CORD STIMULATOR N/A 08/07/2020   Procedure: THORACIC SPINAL CORD STIMULATOR VIA LAMINECTOMY, PULSE GENERATOR;  Surgeon: [name], MD;  Location: [facility];  Service: Neurosurgery;  Laterality: N/A;   TOTAL HIP ARTHROPLASTY Right    TUBAL LIGATION      There were no vitals filed for this visit.   Subjective Assessment - 03/28/21 1638     Subjective R knee is about an 8/10 currently.    Pertinent History S/P R TKA on 02/05/2021. Pt states running behind in PT. Started home health PT, then [facility] PT and was unable to go there due to ride situation. Has L knee replaced around 2016.  Pt was using as SPC on R side for her L LE sciatic symptoms prior to surgery. Sometimes holds SPC on L side due to R knee pain. Pt also twisted her R knee 2 weeks ago resulting in R medial knee pain which has made it go up to 8.5 to 9/10 recently.    Currently in Pain? Yes    Pain Score 8                                        PT Education - 03/28/21 1726     Education provided Yes    Education Details ther-ex    [employer]) Educated Patient    Methods Explanation;Demonstration;Tactile cues;Verbal cues    Comprehension Returned demonstration;Verbalized understanding             Objective   Has spinal cord stimulator. No latex allergies BP is controlled.    Medbridge Access Code [email or web address]   Therapeutic exercise   Small cut observed anterior knee at scar tissue. Pt was informed. No abnormal appearance observed.   Band aid provided, pt applied band aid  Seated knee flexion AROM 108 degrees   -STS hands free from elevated surface (24" upgrade) 1x15             Cues for femoral control and more even weight distribution.   Gait with L UE assist from rw only 100 ft  Adjusted pt rw to proper height  Gait with SPC on L 100 ft. Able to perform safely, SBA. L knee discomfort.   Forward step up onto 4 inch step with R LE and L UE assist 10x3  Supported L foot arch. Decreased L lateral knee pain.   SLS R LE with B UE assist 10x5 seconds for 2 sets. Good muscle use felt.   Side stepping with B UE assist 5 ft to the R and 5 ft to the L 5x to promote glute med muscle strengthening.  Continue working towards transitioning to [facility]   Improved exercise technique, movement at target joints, use of target muscles after min to mod verbal, visual, tactile cues.     Manual therapy Seated STM R scar tissue gentle to decrease stiffness    Seated STM R medial hamstrings to decrease tension        Repsonse to treatment Pt tolerated session well without  aggravation of symptoms.   Clinical Presentation Continued working on decreasing soft tissue restrictions to improve knee ROM. Continued working in improving hip and quad strength to decrease difficulty with transfers and gait. Able to safely ambulate with SPC on L side at least 100 ft. L lateral  knee discomfort however. Decreased L lateral knee pain with weight bearing with L arch supported. Pt was recommended to get arch supports to help decreased L knee pain with closed chain tasks. Small cut observed R anterior knee at scar tissue. No signs of infection observed. Pt was recommended to keep an eye on it to monitor. Pt verbalized understanding. Pt was also provided a band aid which she applied to cover the small cut. Pt tolerated session well without aggravation of symptoms. Pt will benefit from continued skilled physical therapy services to decrease pain, improve strength, ROM and function.          PT Short Term Goals - 03/12/21 1817       PT SHORT TERM GOAL #1   Title Pt will be independent with her initial HEP to improve ROM, strength, function, and ability to amblate with less difficulty.    Baseline Pt has started her HEP. (03/12/2021)    Time 3    Period Weeks    Status New    Target Date 04/05/21               PT Long Term Goals - 03/12/21 1819       PT LONG TERM GOAL #1   Title Pt will improve R knee extension AROM to -5 degrees or less to promote ability to ambulate as well as perform standing tasks with less difficulty.    Baseline Seated L knee extension AROM -10 degrees (03/12/2021)    Time 8    Period Weeks    Status New    Target Date 05/10/21      PT LONG TERM GOAL #2   Title Pt will improve her R knee flexion AROM to 115 degrees or more to promote ability to perform transfers, negotiate stairs with less difficulty.    Baseline Seated R knee flexion AROM 101 degrees (03/12/2021)    Time 8    Period Weeks    Status New    Target Date 05/10/21      PT LONG  TERM GOAL #3   Title Pt will improve R LE strength by at least 1/2 MMT grade to promote ability to perform standing tasks with less difficulty.    Baseline R LE hip flexion 4/5, abduction (sitting) 4/5, extension (sitting) 4-/5, knee flexion 4/5, knee extension 5/5 (03/12/2021)    Time 8    Period Weeks    Status New    Target Date 05/10/21      PT LONG TERM GOAL #4   Title Pt will be able to ambulate wiht SPC at least 300 ft mod I to promote return to PLOF.    Baseline Pt currently ambulates with rw (03/12/2021)    Time 8    Period Weeks    Status New    Target Date 05/10/21      PT LONG TERM GOAL #5   Title Pt will improve her R knee FOTO score by at least 10 points as a demonstration of improved function.    Baseline R knee FOTO 20 (03/12/2021)    Time 8    Period Weeks    Status New    Target Date 05/10/21                   Plan - 03/28/21 1724     Clinical Impression Statement Continued working on decreasing soft tissue restrictions to improve knee ROM. Continued working in improving hip and quad strength to decrease difficulty  with transfers and gait. Able to safely ambulate with SPC on L side at least 100 ft. L lateral knee discomfort however. Decreased L lateral knee pain with weight bearing with L arch supported. Pt was recommended to get arch supports to help decreased L knee pain with closed chain tasks. Small cut observed R anterior knee at scar tissue. No signs of infection observed. Pt was recommended to keep an eye on it to monitor. Pt verbalized understanding. Pt was also provided a band aid which she applied to cover the small cut. Pt tolerated session well without aggravation of symptoms. Pt will benefit from continued skilled physical therapy services to decrease pain, improve strength, ROM and function.    Personal Factors and Comorbidities Comorbidity 3+;Age;Time since onset of injury/illness/exacerbation;Fitness;Past/Current Experience    Comorbidities Spinal  chord stimulator, HTN, Depression, anxiety, COPD, DDD, fibromyalgia, obesity    Examination-Activity Limitations Locomotion Level;Stand;Lift;Transfers;Carry;Squat;Stairs    Stability/Clinical Decision Making Stable/Uncomplicated    Rehab Potential Fair    Clinical Impairments Affecting Rehab Potential (-) medical history; (+) motivated    PT Frequency 2x / week    PT Duration 8 weeks    PT Treatment/Interventions Neuromuscular re-education;Manual techniques;Aquatic Therapy;Electrical Stimulation;Iontophoresis '4mg'$ /ml Dexamethasone;Gait training;Stair training;Functional mobility training;Therapeutic activities;Therapeutic exercise;Balance training;Patient/family education    PT Next Visit Plan ROM, hip and knee strengthening, manual techniques, modalities PRN, gait training    PT Home Exercise Plan Medbridge Access Code [email or web address]    Consulted and Agree with Plan of Care Patient             Patient will benefit from skilled therapeutic intervention in order to improve the following deficits and impairments:  Pain, Improper body mechanics, Postural dysfunction, Difficulty walking, Decreased strength, Decreased range of motion, Decreased balance  Visit Diagnosis: Chronic pain of right knee  Difficulty in walking, not elsewhere classified     Problem List Patient Active Problem List   Diagnosis Date Noted   Total knee replacement status 02/05/2021   Piriformis syndrome of both sides 01/02/2021   Chronic pain 08/07/2020   Chronic midline thoracic back pain 03/27/2020   Psoriasis 12/22/2019   Psoriatic arthritis ([place]) 12/22/2019   Multiple thyroid nodules 10/01/2019   Urticaria, chronic 07/14/2019   History of colon polyps 07/09/2019   Status post reverse arthroplasty of shoulder, left 06/24/2019   History of lumbar laminectomy for spinal cord decompression (L3-L4, 2015) 02/25/2019   Lumbar radiculopathy 02/25/2019   Neutropenia ([place]) 02/16/2019   Primary osteoarthritis involving  multiple joints 02/16/2019   Polyarthralgia 02/02/2019   Cervical spondylosis 09/14/2018   Tachycardia 08/18/2017   Status post total left knee replacement 08/11/2017   Chronic pain syndrome 07/30/2017   Fibromyalgia 07/30/2017   Pelvic pain in female 06/24/2017   Dysuria 06/24/2017   Generalized anxiety disorder 06/23/2017   Primary insomnia 06/23/2017   Lumbar spondylosis 06/23/2017   Stenosis of intervertebral foramina 06/23/2017   Right carpal tunnel syndrome 03/27/2017   HTN, goal below 140/90 03/05/2017   Graves' orbitopathy 02/17/2017   Primary osteoarthritis of right knee 10/18/2016   Bilateral arm weakness 07/16/2016   Spinal stenosis of lumbar region 03/22/2016   Chronic low back pain 02/13/2016   Depression, major, in remission ([place]) 12/21/2015   Back pain at L4-L5 level 10/30/2015   DDD (degenerative disc disease), lumbosacral 10/30/2015   Status post total replacement of right hip 08/21/2015   Leg pain 07/28/2015   Health care maintenance 06/21/2015   Impingement syndrome of shoulder, left 10/20/2014   Severe obesity (  BMI 35.0-35.9 with comorbidity) ([place]) 08/09/2014   Atherosclerosis of abdominal aorta ([place]) 07/29/2014   DM II (diabetes mellitus, type II), controlled ([place]) 04/19/2014   Hemorrhoids 03/14/2014   Osteoporosis 02/03/2014   Migraines 02/03/2014   Hyperlipidemia, unspecified 02/03/2014   GERD (gastroesophageal reflux disease) 02/03/2014    Joneen Boers PT, DPT   03/28/2021, 5:27 PM  [facility] [address] [place], [address] Phone: [phone]   Fax:  [phone]  Name: BRYLEA MOSCOSO MRN: [id number] Date of Birth: [birth year]

## 2021-04-02 ENCOUNTER — Telehealth: Payer: Self-pay

## 2021-04-02 ENCOUNTER — Ambulatory Visit: Payer: Medicare HMO

## 2021-04-02 DIAGNOSIS — R262 Difficulty in walking, not elsewhere classified: Secondary | ICD-10-CM | POA: Diagnosis not present

## 2021-04-02 DIAGNOSIS — M25561 Pain in right knee: Secondary | ICD-10-CM

## 2021-04-02 DIAGNOSIS — G8929 Other chronic pain: Secondary | ICD-10-CM | POA: Diagnosis not present

## 2021-04-02 NOTE — Telephone Encounter (Signed)
Pt is out of meds needs Rx sent

## 2021-04-02 NOTE — Therapy (Signed)
[facility] [address] [place], [address] Phone: [phone]   Fax:  [phone]  Physical Therapy Treatment  Patient Details  Name: Christine Higgins MRN: [id number] Date of Birth: [birth year] Referring Provider (PT): Tamala Julian, [place]   Encounter Date: 04/02/2021   PT End of Session - 04/02/21 1547     Visit Number 5    Number of Visits 17    Date for PT Re-Evaluation 05/10/21    Authorization Type [employer]/ [place] Medicaid    Authorization Time Period Cert  123456; Insurance authorization 03/12/21-06/01/21 for 24 visits    PT Start Time 1548    PT Stop Time 1630    PT Time Calculation (min) 42 min    Equipment Utilized During Treatment Gait belt    Activity Tolerance Patient tolerated treatment well    Behavior During Therapy WFL for tasks assessed/performed             Past Medical History:  Diagnosis Date   Anxiety    Asthma    Breast mass    LEFT x 3 months per pt and palpated by physician   Cervical disc disease    Chronic pain syndrome    COPD (chronic obstructive pulmonary disease) (HCC)    Degenerative disc disease, lumbar    osteoarthritis   Depression    Fibromyalgia    Fibromyalgia    GERD (gastroesophageal reflux disease)    Glaucoma    Graves disease    Headache    migraines   Hemorrhoids    Hyperlipidemia    Hypertension    Hyperthyroidism    Lumbar disc disease    Obesity    Psoriatic arthritis ([place])    Thyroid disease     Past Surgical History:  Procedure Laterality Date   BACK SURGERY     sumbar   CARDIAC CATHETERIZATION     carpel tunn Right    carpel tunnel Left    CESAREAN SECTION     COLONOSCOPY     COLONOSCOPY WITH PROPOFOL N/A 10/20/2020   Procedure: COLONOSCOPY WITH PROPOFOL;  Surgeon: Lesly Rubenstein, MD;  Location: [facility] ENDOSCOPY;  Service: Endoscopy;  Laterality: N/A;   ESOPHAGOGASTRODUODENOSCOPY (EGD) WITH PROPOFOL N/A 06/26/2017    Procedure: ESOPHAGOGASTRODUODENOSCOPY (EGD) WITH PROPOFOL;  Surgeon: Lollie Sails, MD;  Location: [facility] ENDOSCOPY;  Service: Endoscopy;  Laterality: N/A;   EXCISION MORTON'S NEUROMA Left 02/05/2017   Procedure: EXCISION MORTON'S NEUROMA;  Surgeon: Samara Deist, DPM;  Location: [place];  Service: Podiatry;  Laterality: Left;  iva with local   HAND SURGERY Right    scar tissue removal   JOINT REPLACEMENT     right hip arthroplasty 08/25/15   KNEE ARTHROPLASTY Left 08/11/2017   Procedure: COMPUTER ASSISTED TOTAL KNEE ARTHROPLASTY;  Surgeon: Dereck Leep, MD;  Location: [facility];  Service: Orthopedics;  Laterality: Left;   KNEE ARTHROPLASTY Right 02/05/2021   Procedure: COMPUTER ASSISTED TOTAL KNEE ARTHROPLASTY;  Surgeon: Dereck Leep, MD;  Location: [facility];  Service: Orthopedics;  Laterality: Right;   KNEE ARTHROSCOPY Right 07/02/2018   Procedure: ARTHROSCOPY KNEE, Medial and Lateral  Chondroplasty;  Surgeon: Dereck Leep, MD;  Location: [facility] OR[facility];  Service: Orthopedics;  Laterality: Right;   NECK SURGERY     "disk implant"   REVERSE SHOULDER ARTHROPLASTY Left 06/24/2019   Procedure: REVERSE SHOULDER ARTHROPLASTY;  Surgeon: Corky Mull, MD;  Location: [facility];  Service: Orthopedics;  Laterality:  Left;   SHOULDER SURGERY Right    spur removal   THORACIC LAMINECTOMY FOR SPINAL CORD STIMULATOR N/A 08/07/2020   Procedure: THORACIC SPINAL CORD STIMULATOR VIA LAMINECTOMY, PULSE GENERATOR;  Surgeon: [name], MD;  Location: [facility];  Service: Neurosurgery;  Laterality: N/A;   TOTAL HIP ARTHROPLASTY Right    TUBAL LIGATION      There were no vitals filed for this visit.   Subjective Assessment - 04/02/21 1550     Subjective R knee is still sore. 7.5/10 when standing on her R LE.    Pertinent History S/P R TKA on 02/05/2021. Pt states running behind in PT. Started home health PT, then [facility] PT and was unable to go there due to ride situation. Has L knee  replaced around 2016. Pt was using as SPC on R side for her L LE sciatic symptoms prior to surgery. Sometimes holds SPC on L side due to R knee pain. Pt also twisted her R knee 2 weeks ago resulting in R medial knee pain which has made it go up to 8.5 to 9/10 recently.    Currently in Pain? Yes    Pain Score 7                                        PT Education - 04/02/21 1623     Education provided Yes    Education Details ther-ex    [employer]) Educated Patient    Methods Explanation;Demonstration;Tactile cues;Verbal cues    Comprehension Returned demonstration;Verbalized understanding             Objective   Has spinal cord stimulator. No latex allergies BP is controlled.    Medbridge Access Code [email or web address]   Therapeutic exercise   Small cut observed be healing well to almost completely healed.    Gait with SPC on  L side 200 ft. Able to perform safely, SBA. L knee discomfort.   Side stepping with SPC 32 ft to the R and 32 ft to the L    Forward step up onto and over 4 inch step with R LE and use of SPC 10x. Able to step up with R LE and step down with L LE  Ascending and descending 4 regular steps with B UE assist 4x with reciprocal pattern. Able to perform based on R knee. Has back pain history which bothers her.   Seated hip extension isometrics   R 10x5 seconds for 3 sets  Sit <> stand from regular chair with arms with 2 Air Ex pads 10x, no UE assist.    Able to perform but knee discomfort.   SLS R LE with B UE assist 10x5 seconds for 2 sets. Good muscle use felt.    Seated hip adduction ball squeeze 10x5 seconds for 3 sets   Cues to activate glute max and transversus abdominis muscles as well. Decreased low back and hip pain  Standing hip abduction with B UE assist   R 10x5 seconds for 2 sets  L knee joint discomfort with standing.      Improved exercise technique, movement at target joints, use of target muscles after min to  mod verbal, visual, tactile cues.          Repsonse to treatment Pt tolerated session well without aggravation of symptoms.   Clinical Presentation Continued working on improving R LE strength to promote closed  chain function. Able to safely ambulate with SPC on flat surface as well as curb height step. No LOB. Pt ambulated safely with SPC throughout session progressed to [facility] from her rw for AD. Pt tolerated session well without aggravation of symptoms. Pt will benefit from continued skilled physical therapy services to decrease pain, improve strength, ROM and function.        PT Short Term Goals - 03/12/21 1817       PT SHORT TERM GOAL #1   Title Pt will be independent with her initial HEP to improve ROM, strength, function, and ability to amblate with less difficulty.    Baseline Pt has started her HEP. (03/12/2021)    Time 3    Period Weeks    Status New    Target Date 04/05/21               PT Long Term Goals - 03/12/21 1819       PT LONG TERM GOAL #1   Title Pt will improve R knee extension AROM to -5 degrees or less to promote ability to ambulate as well as perform standing tasks with less difficulty.    Baseline Seated L knee extension AROM -10 degrees (03/12/2021)    Time 8    Period Weeks    Status New    Target Date 05/10/21      PT LONG TERM GOAL #2   Title Pt will improve her R knee flexion AROM to 115 degrees or more to promote ability to perform transfers, negotiate stairs with less difficulty.    Baseline Seated R knee flexion AROM 101 degrees (03/12/2021)    Time 8    Period Weeks    Status New    Target Date 05/10/21      PT LONG TERM GOAL #3   Title Pt will improve R LE strength by at least 1/2 MMT grade to promote ability to perform standing tasks with less difficulty.    Baseline R LE hip flexion 4/5, abduction (sitting) 4/5, extension (sitting) 4-/5, knee flexion 4/5, knee extension 5/5 (03/12/2021)    Time 8    Period Weeks    Status New     Target Date 05/10/21      PT LONG TERM GOAL #4   Title Pt will be able to ambulate wiht SPC at least 300 ft mod I to promote return to PLOF.    Baseline Pt currently ambulates with rw (03/12/2021)    Time 8    Period Weeks    Status New    Target Date 05/10/21      PT LONG TERM GOAL #5   Title Pt will improve her R knee FOTO score by at least 10 points as a demonstration of improved function.    Baseline R knee FOTO 20 (03/12/2021)    Time 8    Period Weeks    Status New    Target Date 05/10/21                   Plan - 04/02/21 1547     Clinical Impression Statement Continued working on improving R LE strength to promote closed chain function. Able to safely ambulate with SPC on flat surface as well as curb height step. No LOB. Pt ambulated safely with SPC throughout session progressed to [facility] from her rw for AD. Pt tolerated session well without aggravation of symptoms. Pt will benefit from continued skilled physical therapy services to decrease pain, improve  strength, ROM and function.    Personal Factors and Comorbidities Comorbidity 3+;Age;Time since onset of injury/illness/exacerbation;Fitness;Past/Current Experience    Comorbidities Spinal chord stimulator, HTN, Depression, anxiety, COPD, DDD, fibromyalgia, obesity    Examination-Activity Limitations Locomotion Level;Stand;Lift;Transfers;Carry;Squat;Stairs    Stability/Clinical Decision Making Stable/Uncomplicated    Rehab Potential Fair    Clinical Impairments Affecting Rehab Potential (-) medical history; (+) motivated    PT Frequency 2x / week    PT Duration 8 weeks    PT Treatment/Interventions Neuromuscular re-education;Manual techniques;Aquatic Therapy;Electrical Stimulation;Iontophoresis '4mg'$ /ml Dexamethasone;Gait training;Stair training;Functional mobility training;Therapeutic activities;Therapeutic exercise;Balance training;Patient/family education    PT Next Visit Plan ROM, hip and knee strengthening, manual  techniques, modalities PRN, gait training    PT Home Exercise Plan Medbridge Access Code [email or web address]    Consulted and Agree with Plan of Care Patient             Patient will benefit from skilled therapeutic intervention in order to improve the following deficits and impairments:  Pain, Improper body mechanics, Postural dysfunction, Difficulty walking, Decreased strength, Decreased range of motion, Decreased balance  Visit Diagnosis: Chronic pain of right knee  Difficulty in walking, not elsewhere classified     Problem List Patient Active Problem List   Diagnosis Date Noted   Total knee replacement status 02/05/2021   Piriformis syndrome of both sides 01/02/2021   Chronic pain 08/07/2020   Chronic midline thoracic back pain 03/27/2020   Psoriasis 12/22/2019   Psoriatic arthritis ([place]) 12/22/2019   Multiple thyroid nodules 10/01/2019   Urticaria, chronic 07/14/2019   History of colon polyps 07/09/2019   Status post reverse arthroplasty of shoulder, left 06/24/2019   History of lumbar laminectomy for spinal cord decompression (L3-L4, 2015) 02/25/2019   Lumbar radiculopathy 02/25/2019   Neutropenia ([place]) 02/16/2019   Primary osteoarthritis involving multiple joints 02/16/2019   Polyarthralgia 02/02/2019   Cervical spondylosis 09/14/2018   Tachycardia 08/18/2017   Status post total left knee replacement 08/11/2017   Chronic pain syndrome 07/30/2017   Fibromyalgia 07/30/2017   Pelvic pain in female 06/24/2017   Dysuria 06/24/2017   Generalized anxiety disorder 06/23/2017   Primary insomnia 06/23/2017   Lumbar spondylosis 06/23/2017   Stenosis of intervertebral foramina 06/23/2017   Right carpal tunnel syndrome 03/27/2017   HTN, goal below 140/90 03/05/2017   Graves' orbitopathy 02/17/2017   Primary osteoarthritis of right knee 10/18/2016   Bilateral arm weakness 07/16/2016   Spinal stenosis of lumbar region 03/22/2016   Chronic low back pain 02/13/2016   Depression,  major, in remission ([place]) 12/21/2015   Back pain at L4-L5 level 10/30/2015   DDD (degenerative disc disease), lumbosacral 10/30/2015   Status post total replacement of right hip 08/21/2015   Leg pain 07/28/2015   Health care maintenance 06/21/2015   Impingement syndrome of shoulder, left 10/20/2014   Severe obesity (BMI 35.0-35.9 with comorbidity) ([place]) 08/09/2014   Atherosclerosis of abdominal aorta ([place]) 07/29/2014   DM II (diabetes mellitus, type II), controlled ([place]) 04/19/2014   Hemorrhoids 03/14/2014   Osteoporosis 02/03/2014   Migraines 02/03/2014   Hyperlipidemia, unspecified 02/03/2014   GERD (gastroesophageal reflux disease) 02/03/2014    Joneen Boers PT, DPT   04/02/2021, 4:36 PM  [facility] [address], [place], [address] Phone: [phone]   Fax:  [phone]  Name: Christine Higgins MRN: [id number] Date of Birth: [birth year]

## 2021-04-02 NOTE — Telephone Encounter (Signed)
Spoke with patient.  She states that was a mistake and to disregard the message.

## 2021-04-04 ENCOUNTER — Ambulatory Visit (INDEPENDENT_AMBULATORY_CARE_PROVIDER_SITE_OTHER): Payer: Medicare HMO | Admitting: Licensed Clinical Social Worker

## 2021-04-04 ENCOUNTER — Encounter
Admission: RE | Admit: 2021-04-04 | Discharge: 2021-04-04 | Disposition: A | Discharge status: Home / Self Care | Payer: Medicare HMO | Source: Ambulatory Visit | Attending: Neurosurgery | Admitting: Neurosurgery

## 2021-04-04 ENCOUNTER — Other Ambulatory Visit
Admission: RE | Admit: 2021-04-04 | Discharge: 2021-04-04 | Disposition: A | Discharge status: Home / Self Care | Payer: Medicare HMO | Source: Ambulatory Visit | Attending: Neurosurgery | Admitting: Neurosurgery

## 2021-04-04 ENCOUNTER — Other Ambulatory Visit: Payer: Self-pay

## 2021-04-04 ENCOUNTER — Encounter: Payer: Self-pay | Admitting: Urgent Care

## 2021-04-04 ENCOUNTER — Ambulatory Visit: Payer: Medicare HMO

## 2021-04-04 DIAGNOSIS — M25561 Pain in right knee: Secondary | ICD-10-CM

## 2021-04-04 DIAGNOSIS — G8929 Other chronic pain: Secondary | ICD-10-CM

## 2021-04-04 DIAGNOSIS — Z01812 Encounter for preprocedural laboratory examination: Secondary | ICD-10-CM | POA: Diagnosis not present

## 2021-04-04 DIAGNOSIS — R262 Difficulty in walking, not elsewhere classified: Secondary | ICD-10-CM

## 2021-04-04 DIAGNOSIS — F4325 Adjustment disorder with mixed disturbance of emotions and conduct: Secondary | ICD-10-CM | POA: Diagnosis not present

## 2021-04-04 HISTORY — DX: Prediabetes: R73.03

## 2021-04-04 LAB — CBC
HCT: 33.4 % — ABNORMAL LOW (ref 36.0–46.0)
Hemoglobin: 10.5 g/dL — ABNORMAL LOW (ref 12.0–15.0)
MCH: 24.9 pg — ABNORMAL LOW (ref 26.0–34.0)
MCHC: 31.4 g/dL (ref 30.0–36.0)
MCV: 79.3 fL — ABNORMAL LOW (ref 80.0–100.0)
Platelets: 219 10*3/uL (ref 150–400)
RBC: 4.21 MIL/uL (ref 3.87–5.11)
RDW: 14.1 % (ref 11.5–15.5)
WBC: 3.8 10*3/uL — ABNORMAL LOW (ref 4.0–10.5)
nRBC: 0 % (ref 0.0–0.2)

## 2021-04-04 LAB — BASIC METABOLIC PANEL
Anion gap: 7 (ref 5–15)
BUN: 7 mg/dL — ABNORMAL LOW (ref 8–23)
CO2: 30 mmol/L (ref 22–32)
Calcium: 9.7 mg/dL (ref 8.9–10.3)
Chloride: 102 mmol/L (ref 98–111)
Creatinine, Ser: 0.77 mg/dL (ref 0.44–1.00)
GFR, Estimated: >60 mL/min (ref >60)
Glucose, Bld: 106 mg/dL — ABNORMAL HIGH (ref 70–99)
Potassium: 4.3 mmol/L (ref 3.5–5.1)
Sodium: 139 mmol/L (ref 135–145)

## 2021-04-04 LAB — SURGICAL PCR SCREEN
MRSA, PCR: NEGATIVE
Staphylococcus aureus: NEGATIVE

## 2021-04-04 NOTE — Progress Notes (Signed)
   THERAPIST PROGRESS NOTE  Session Time: 1-2p  Participation Level: Active  Behavioral Response: Neat and Well GroomedAlertpleasant; engaged throughout session  Type of Therapy: Individual Therapy  Treatment Goals addressed: Anxiety and Diagnosis: depression  Interventions: Strength-based and Supportive  Summary: Christine Higgins is a 65 y.o. female who presents with improving symptoms related to Adjustment disorder diagnosis. patient reports that overall mood has been stable, and that she is managing situational anxiety and stressors well. Patient reports good quality and quantity of sleep. Patient reports occasional sleep interruption buy me or back pain. Patient is reporting continuing back and knee pain. Patient is continuing with physical therapy to help with mobility in pain management. Patient reports that she recently moved into assisted living, and could not be happier. Patient reports that she is enjoying the social engagement that she is receiving on a daily basis, three meals a day, and not feeling weighted down by the repairs that she had to do in her house. Patient reports positive relationship with immediate and extended family members. Patient reports that she has made a new friend at the assisted living, and is working hard to help her come out of her shell. Patient and counselor spent a lot of time reflecting on overall life choices, consequences, and wisdom that patient has gathered throughout a lifespan. Patient feels like she wants to share that with others. Allowed patient to explore her spirituality, and how her spirituality has been a guiding force throughout her lifespan. Continued recommendations are as follows: self care behaviors, positive social engagements, focusing on overall work/home/life balance, and focusing on positive physical and emotional wellness.  .   Suicidal/Homicidal: No  Therapist Response: Christine Higgins is doing a good job of describing current past  experiences with specific fears prominent worries and anxiety symptoms including overall impact on functioning and her attempts to resolve it. Patient has the ability to identify and anxiety coping mechanism that has been successful in the past and increase its use. Christine Higgins demonstrates that she understands how faults, physical feelings, and behavioral actions contribute to anxiety and overall treatment. Patient is continuing to develop healthy interpersonal relationships that lead to the alleviation and help prevent relapse of depression symptoms. Patient can utilize behavioral strategies to overcome depression. Patient is verbalizing more hopeful and positive statements regarding the future. These behaviors are reflective of progress and personal growth period treatment to continue as indicated  Plan: Return again in 4 weeks.  Diagnosis: Axis I: Adjustment Disorder with Mixed Disturbance of Emotions and Conduct    Axis II: No diagnosis    [place], LCSW 04/04/2021

## 2021-04-04 NOTE — Therapy (Signed)
[facility] [address] [place], [address] Phone: [phone]   Fax:  [phone]  Physical Therapy Treatment  Patient Details  Name: [facility] MRN: [id number] Date of Birth: [birth year] Referring Provider (PT): Tamala Julian, [place]   Encounter Date: 04/04/2021   PT End of Session - 04/04/21 1547     Visit Number 6    Number of Visits 17    Date for PT Re-Evaluation 05/10/21    Authorization Type [employer]/ [place] Medicaid    Authorization Time Period Cert  123456; Insurance authorization 03/12/21-06/01/21 for 24 visits    PT Start Time 1548    PT Stop Time [id number]    PT Time Calculation (min) 43 min    Equipment Utilized During Treatment --    Activity Tolerance Patient tolerated treatment well    Behavior During Therapy WFL for tasks assessed/performed             Past Medical History:  Diagnosis Date   Anxiety    Asthma    Breast mass    LEFT x 3 months per pt and palpated by physician   Cervical disc disease    Chronic pain syndrome    COPD (chronic obstructive pulmonary disease) (HCC)    Degenerative disc disease, lumbar    osteoarthritis   Depression    Fibromyalgia    Fibromyalgia    GERD (gastroesophageal reflux disease)    Glaucoma    Graves disease    Headache    migraines   Hemorrhoids    Hyperlipidemia    Hypertension    Hyperthyroidism    Lumbar disc disease    Obesity    Pre-diabetes    Psoriatic arthritis ([place])    Thyroid disease     Past Surgical History:  Procedure Laterality Date   BACK SURGERY     sumbar   CARDIAC CATHETERIZATION     carpel tunn Right    carpel tunnel Left    CESAREAN SECTION     COLONOSCOPY     COLONOSCOPY WITH PROPOFOL N/A 10/20/2020   Procedure: COLONOSCOPY WITH PROPOFOL;  Surgeon: Lesly Rubenstein, MD;  Location: [facility];  Service: Endoscopy;  Laterality: N/A;   ESOPHAGOGASTRODUODENOSCOPY (EGD) WITH PROPOFOL N/A  06/26/2017   Procedure: ESOPHAGOGASTRODUODENOSCOPY (EGD) WITH PROPOFOL;  Surgeon: Lollie Sails, MD;  Location: [facility] ENDOSCOPY;  Service: Endoscopy;  Laterality: N/A;   EXCISION MORTON'S NEUROMA Left 02/05/2017   Procedure: EXCISION MORTON'S NEUROMA;  Surgeon: Samara Deist, DPM;  Location: [facility];  Service: Podiatry;  Laterality: Left;  iva with local   HAND SURGERY Right    scar tissue removal   JOINT REPLACEMENT     right hip arthroplasty 08/25/15   KNEE ARTHROPLASTY Left 08/11/2017   Procedure: COMPUTER ASSISTED TOTAL KNEE ARTHROPLASTY;  Surgeon: Dereck Leep, MD;  Location: [facility];  Service: Orthopedics;  Laterality: Left;   KNEE ARTHROPLASTY Right 02/05/2021   Procedure: COMPUTER ASSISTED TOTAL KNEE ARTHROPLASTY;  Surgeon: Dereck Leep, MD;  Location: [facility];  Service: Orthopedics;  Laterality: Right;   KNEE ARTHROSCOPY Right 07/02/2018   Procedure: ARTHROSCOPY KNEE, Medial and Lateral  Chondroplasty;  Surgeon: Dereck Leep, MD;  Location: [facility];  Service: Orthopedics;  Laterality: Right;   NECK SURGERY     "disk implant"   REVERSE SHOULDER ARTHROPLASTY Left 06/24/2019   Procedure: REVERSE SHOULDER ARTHROPLASTY;  Surgeon: Corky Mull, MD;  Location: [facility];  Service:  Orthopedics;  Laterality: Left;   SHOULDER SURGERY Right    spur removal   THORACIC LAMINECTOMY FOR SPINAL CORD STIMULATOR N/A 08/07/2020   Procedure: THORACIC SPINAL CORD STIMULATOR VIA LAMINECTOMY, PULSE GENERATOR;  Surgeon: [name], MD;  Location: [facility];  Service: Neurosurgery;  Laterality: N/A;   TOTAL HIP ARTHROPLASTY Right    TUBAL LIGATION      There were no vitals filed for this visit.   Subjective Assessment - 04/04/21 1549     Subjective 8/10 R knee currently. R knee was pretty rough after last session.    Pertinent History S/P R TKA on 02/05/2021. Pt states running behind in PT. Started home health PT, then [facility] PT and was unable to go there due to ride  situation. Has L knee replaced around 2016. Pt was using as SPC on R side for her L LE sciatic symptoms prior to surgery. Sometimes holds SPC on L side due to R knee pain. Pt also twisted her R knee 2 weeks ago resulting in R medial knee pain which has made it go up to 8.5 to 9/10 recently.    Currently in Pain? Yes    Pain Score 8                                        PT Education - 04/04/21 1558     Education provided Yes    Education Details ther-ex    [employer]) Educated Patient    Methods Explanation;Demonstration;Tactile cues;Verbal cues    Comprehension Returned demonstration;Verbalized understanding             Objective   Has spinal cord stimulator. No latex allergies BP is controlled.    Medbridge Access Code [email or web address]   Therapeutic exercise  R knee small cut healed.   NuStep seat 10, arms 10, level 1 for 5 minutes to promote joint movement and decrease knee stiffness.   Side step with B UE assist 5 ft to the L and 5 ft to the R 10x  Seated R knee extension 5 lbs 10x2  Standing R knee flexion with B UE assist 10x, then 10x5 seconds for 2 sets   Seated hip extension isometrics             R 10x5 seconds then with PT manual resistance 10x2 with 5 second holds   Forward step up onto 1st regular step with B UE assist   R 10x3   Seated hip adduction ball squeeze 10x5 seconds for 3 sets             Cues to activate glute max and transversus abdominis muscles as well. Decreased low back and hip pain   standing knee flexion stretch 2nd step with B UE assist 10x5 seconds, then 10x2     Improved exercise technique, movement at target joints, use of target muscles after min to mod verbal, visual, tactile cues.          Repsonse to treatment Pt tolerated session well without aggravation of symptoms.   Clinical Presentation Continued working on improving R LE strength and knee flexion ROM to promote ability to ambulate and  negotiate stairs, curbs, and perform transfers with less difficulty. Pt tolerated session well without aggravation of symptoms. Pt will benefit from continued skilled physical therapy services to decrease pain, improve strength, ROM and function.       PT  Short Term Goals - 03/12/21 1817       PT SHORT TERM GOAL #1   Title Pt will be independent with her initial HEP to improve ROM, strength, function, and ability to amblate with less difficulty.    Baseline Pt has started her HEP. (03/12/2021)    Time 3    Period Weeks    Status New    Target Date 04/05/21               PT Long Term Goals - 03/12/21 1819       PT LONG TERM GOAL #1   Title Pt will improve R knee extension AROM to -5 degrees or less to promote ability to ambulate as well as perform standing tasks with less difficulty.    Baseline Seated L knee extension AROM -10 degrees (03/12/2021)    Time 8    Period Weeks    Status New    Target Date 05/10/21      PT LONG TERM GOAL #2   Title Pt will improve her R knee flexion AROM to 115 degrees or more to promote ability to perform transfers, negotiate stairs with less difficulty.    Baseline Seated R knee flexion AROM 101 degrees (03/12/2021)    Time 8    Period Weeks    Status New    Target Date 05/10/21      PT LONG TERM GOAL #3   Title Pt will improve R LE strength by at least 1/2 MMT grade to promote ability to perform standing tasks with less difficulty.    Baseline R LE hip flexion 4/5, abduction (sitting) 4/5, extension (sitting) 4-/5, knee flexion 4/5, knee extension 5/5 (03/12/2021)    Time 8    Period Weeks    Status New    Target Date 05/10/21      PT LONG TERM GOAL #4   Title Pt will be able to ambulate wiht SPC at least 300 ft mod I to promote return to PLOF.    Baseline Pt currently ambulates with rw (03/12/2021)    Time 8    Period Weeks    Status New    Target Date 05/10/21      PT LONG TERM GOAL #5   Title Pt will improve her R knee FOTO  score by at least 10 points as a demonstration of improved function.    Baseline R knee FOTO 20 (03/12/2021)    Time 8    Period Weeks    Status New    Target Date 05/10/21                   Plan - 04/04/21 1641     Clinical Impression Statement Continued working on improving R LE strength and knee flexion ROM to promote ability to ambulate and negotiate stairs, curbs, and perform transfers with less difficulty. Pt tolerated session well without aggravation of symptoms. Pt will benefit from continued skilled physical therapy services to decrease pain, improve strength, ROM and function.    Personal Factors and Comorbidities Comorbidity 3+;Age;Time since onset of injury/illness/exacerbation;Fitness;Past/Current Experience    Comorbidities Spinal chord stimulator, HTN, Depression, anxiety, COPD, DDD, fibromyalgia, obesity    Examination-Activity Limitations Locomotion Level;Stand;Lift;Transfers;Carry;Squat;Stairs    Stability/Clinical Decision Making Stable/Uncomplicated    Rehab Potential Fair    Clinical Impairments Affecting Rehab Potential (-) medical history; (+) motivated    PT Frequency 2x / week    PT Duration 8 weeks    PT Treatment/Interventions  Neuromuscular re-education;Manual techniques;Aquatic Therapy;Electrical Stimulation;Iontophoresis '4mg'$ /ml Dexamethasone;Gait training;Stair training;Functional mobility training;Therapeutic activities;Therapeutic exercise;Balance training;Patient/family education    PT Next Visit Plan ROM, hip and knee strengthening, manual techniques, modalities PRN, gait training    PT Home Exercise Plan Medbridge Access Code [email or web address]    Consulted and Agree with Plan of Care Patient             Patient will benefit from skilled therapeutic intervention in order to improve the following deficits and impairments:  Pain, Improper body mechanics, Postural dysfunction, Difficulty walking, Decreased strength, Decreased range of motion, Decreased  balance  Visit Diagnosis: Chronic pain of right knee  Difficulty in walking, not elsewhere classified     Problem List Patient Active Problem List   Diagnosis Date Noted   Total knee replacement status 02/05/2021   Piriformis syndrome of both sides 01/02/2021   Chronic pain 08/07/2020   Chronic midline thoracic back pain 03/27/2020   Psoriasis 12/22/2019   Psoriatic arthritis ([place]) 12/22/2019   Multiple thyroid nodules 10/01/2019   Urticaria, chronic 07/14/2019   History of colon polyps 07/09/2019   Status post reverse arthroplasty of shoulder, left 06/24/2019   History of lumbar laminectomy for spinal cord decompression (L3-L4, 2015) 02/25/2019   Lumbar radiculopathy 02/25/2019   Neutropenia ([place]) 02/16/2019   Primary osteoarthritis involving multiple joints 02/16/2019   Polyarthralgia 02/02/2019   Cervical spondylosis 09/14/2018   Tachycardia 08/18/2017   Status post total left knee replacement 08/11/2017   Chronic pain syndrome 07/30/2017   Fibromyalgia 07/30/2017   Pelvic pain in female 06/24/2017   Dysuria 06/24/2017   Generalized anxiety disorder 06/23/2017   Primary insomnia 06/23/2017   Lumbar spondylosis 06/23/2017   Stenosis of intervertebral foramina 06/23/2017   Right carpal tunnel syndrome 03/27/2017   HTN, goal below 140/90 03/05/2017   Graves' orbitopathy 02/17/2017   Primary osteoarthritis of right knee 10/18/2016   Bilateral arm weakness 07/16/2016   Spinal stenosis of lumbar region 03/22/2016   Chronic low back pain 02/13/2016   Depression, major, in remission ([place]) 12/21/2015   Back pain at L4-L5 level 10/30/2015   DDD (degenerative disc disease), lumbosacral 10/30/2015   Status post total replacement of right hip 08/21/2015   Leg pain 07/28/2015   Health care maintenance 06/21/2015   Impingement syndrome of shoulder, left 10/20/2014   Severe obesity (BMI 35.0-35.9 with comorbidity) ([place]) 08/09/2014   Atherosclerosis of abdominal aorta ([place])  07/29/2014   DM II (diabetes mellitus, type II), controlled ([place]) 04/19/2014   Hemorrhoids 03/14/2014   Osteoporosis 02/03/2014   Migraines 02/03/2014   Hyperlipidemia, unspecified 02/03/2014   GERD (gastroesophageal reflux disease) 02/03/2014    Kire Ferg 04/04/2021, 4:47 PM   [facility] [address], [place], [address] Phone: [phone]   Fax:  [phone]  Name: [facility] MRN: [id number] Date of Birth: Oct 04, 1955

## 2021-04-04 NOTE — Patient Instructions (Addendum)
Your procedure is scheduled on: 04/09/21  Report to the Registration Desk on the 1st floor of the [facility]. To find out your arrival time, please call [phone] between 1PM - 3PM on: 04/06/21  REMEMBER: Instructions that are not followed completely may result in serious medical risk, up to and including death; or upon the discretion of your surgeon and anesthesiologist your surgery may need to be rescheduled.  Do not eat food after midnight the night before surgery.  No gum chewing, lozengers or hard candies.  You may however, drink CLEAR liquids up to 2 hours before you are scheduled to arrive for your surgery. Do not drink anything within 2 hours of your scheduled arrival time.  Clear liquids include: - water  - apple juice without pulp - gatorade (not RED, PURPLE, OR BLUE) - black coffee or tea (Do NOT add milk or creamers to the coffee or tea) Do NOT drink anything that is not on this list.   TAKE THESE MEDICATIONS THE MORNING OF SURGERY WITH A SIP OF WATER:  - budesonide-formoterol (SYMBICORT) 160-4.5 MCG/ACT inhaler - DULoxetine (CYMBALTA) 30 MG capsule - esomeprazole (NEXIUM) 40 MG capsule, take one the night before and one on the morning of surgery - helps to prevent nausea after surgery. - fluticasone (FLONASE) 50 MCG/ACT nasal spray - gabapentin (NEURONTIN) 300 MG capsule - methimazole (TAPAZOLE) 10 MG tablet - propranolol (INDERAL) 40 MG tablet  Follow recommendations from Cardiologist, Pulmonologist or PCP regarding stopping Aspirin, Coumadin, Plavix, Eliquis, Pradaxa, or Pletal.  Stop Celebrex 1 week before surgery, can resume 1 week later.  One week prior to surgery: Stop Anti-inflammatories (NSAIDS) such as Advil, Aleve, Ibuprofen, Motrin, Naproxen, Naprosyn and Aspirin based products such as Excedrin, Goodys Powder, BC Powder.  Stop ANY OVER THE COUNTER supplements until after surgery.  You may however, continue to take Tylenol if needed for pain up  until the day of surgery.  No Alcohol for 24 hours before or after surgery.  No Smoking including e-cigarettes for 24 hours prior to surgery.  No chewable tobacco products for at least 6 hours prior to surgery.  No nicotine patches on the day of surgery.  Do not use any "recreational" drugs for at least a week prior to your surgery.  Please be advised that the combination of cocaine and anesthesia may have negative outcomes, up to and including death. If you test positive for cocaine, your surgery will be cancelled.  On the morning of surgery brush your teeth with toothpaste and water, you may rinse your mouth with mouthwash if you wish. Do not swallow any toothpaste or mouthwash.  Do not wear jewelry, make-up, hairpins, clips or nail polish.  Do not wear lotions, powders, or perfumes.   Do not shave body from the neck down 48 hours prior to surgery just in case you cut yourself which could leave a site for infection.  Also, freshly shaved skin may become irritated if using the CHG soap.  Contact lenses, hearing aids and dentures may not be worn into surgery.  Do not bring valuables to the hospital. [facility] is not responsible for any missing/lost belongings or valuables.   Use CHG Soap or wipes as directed on instruction sheet.  Notify your doctor if there is any change in your medical condition (cold, fever, infection).  Wear comfortable clothing (specific to your surgery type) to the hospital.  After surgery, you can help prevent lung complications by doing breathing exercises.  Take deep breaths and  cough every 1-2 hours. Your doctor may order a device called an Incentive Spirometer to help you take deep breaths. When coughing or sneezing, hold a pillow firmly against your incision with both hands. This is called "splinting." Doing this helps protect your incision. It also decreases belly discomfort.  If you are being admitted to the hospital overnight, leave your suitcase  in the car. After surgery it may be brought to your room.  If you are being discharged the day of surgery, you will not be allowed to drive home. You will need a responsible adult (18 years or older) to drive you home and stay with you that night.   If you are taking public transportation, you will need to have a responsible adult (18 years or older) with you. Please confirm with your physician that it is acceptable to use public transportation.   Please call the [facility] at [phone] if you have any questions about these instructions.  Surgery Visitation Policy:  Patients undergoing a surgery or procedure may have one family member or support person with them as long as that person is not COVID-19 positive or experiencing its symptoms.  That person may remain in the waiting area during the procedure.  Inpatient Visitation:    Visiting hours are 7 a.m. to 8 p.m. Inpatients will be allowed two visitors daily. The visitors may change each day during the patient's stay. No visitors under the age of 76. Any visitor under the age of 50 must be accompanied by an adult. The visitor must pass COVID-19 screenings, use hand sanitizer when entering and exiting the patient's room and wear a mask at all times, including in the patient's room. Patients must also wear a mask when staff or their visitor are in the room. Masking is required regardless of vaccination status.

## 2021-04-05 ENCOUNTER — Other Ambulatory Visit: Admission: RE | Admit: 2021-04-05 | Payer: Medicare HMO | Source: Ambulatory Visit

## 2021-04-05 ENCOUNTER — Telehealth: Payer: Self-pay

## 2021-04-05 NOTE — Telephone Encounter (Signed)
Pt pharmacy have not received oxycodone prescription

## 2021-04-06 ENCOUNTER — Telehealth: Payer: Self-pay

## 2021-04-06 NOTE — Telephone Encounter (Signed)
The patient is no longer taking oxycodone.

## 2021-04-09 ENCOUNTER — Other Ambulatory Visit: Payer: Self-pay

## 2021-04-09 ENCOUNTER — Encounter: Payer: Self-pay | Admitting: Neurosurgery

## 2021-04-09 ENCOUNTER — Ambulatory Visit: Payer: Medicare HMO | Admitting: Anesthesiology

## 2021-04-09 ENCOUNTER — Ambulatory Visit: Payer: Medicare HMO

## 2021-04-09 ENCOUNTER — Ambulatory Visit
Admission: RE | Admit: 2021-04-09 | Discharge: 2021-04-09 | Disposition: A | Discharge status: Home / Self Care | Payer: Medicare HMO | Attending: Neurosurgery | Admitting: Neurosurgery

## 2021-04-09 ENCOUNTER — Encounter
Admission: RE | Disposition: A | Discharge status: Home / Self Care | Payer: Self-pay | Source: Home / Self Care | Attending: Neurosurgery

## 2021-04-09 DIAGNOSIS — Z7951 Long term (current) use of inhaled steroids: Secondary | ICD-10-CM | POA: Insufficient documentation

## 2021-04-09 DIAGNOSIS — Z7901 Long term (current) use of anticoagulants: Secondary | ICD-10-CM | POA: Diagnosis not present

## 2021-04-09 DIAGNOSIS — Z87891 Personal history of nicotine dependence: Secondary | ICD-10-CM | POA: Diagnosis not present

## 2021-04-09 DIAGNOSIS — G894 Chronic pain syndrome: Secondary | ICD-10-CM | POA: Diagnosis not present

## 2021-04-09 DIAGNOSIS — Z886 Allergy status to analgesic agent status: Secondary | ICD-10-CM | POA: Diagnosis not present

## 2021-04-09 DIAGNOSIS — Z888 Allergy status to other drugs, medicaments and biological substances status: Secondary | ICD-10-CM | POA: Diagnosis not present

## 2021-04-09 DIAGNOSIS — Z91013 Allergy to seafood: Secondary | ICD-10-CM | POA: Insufficient documentation

## 2021-04-09 DIAGNOSIS — T85890A Other specified complication of nervous system prosthetic devices, implants and grafts, initial encounter: Secondary | ICD-10-CM | POA: Diagnosis not present

## 2021-04-09 DIAGNOSIS — Z791 Long term (current) use of non-steroidal anti-inflammatories (NSAID): Secondary | ICD-10-CM | POA: Insufficient documentation

## 2021-04-09 DIAGNOSIS — Z809 Family history of malignant neoplasm, unspecified: Secondary | ICD-10-CM | POA: Diagnosis not present

## 2021-04-09 DIAGNOSIS — X58XXXA Exposure to other specified factors, initial encounter: Secondary | ICD-10-CM | POA: Insufficient documentation

## 2021-04-09 DIAGNOSIS — R000 Tachycardia, unspecified: Secondary | ICD-10-CM | POA: Diagnosis not present

## 2021-04-09 DIAGNOSIS — E119 Type 2 diabetes mellitus without complications: Secondary | ICD-10-CM | POA: Diagnosis not present

## 2021-04-09 DIAGNOSIS — Z79899 Other long term (current) drug therapy: Secondary | ICD-10-CM | POA: Insufficient documentation

## 2021-04-09 DIAGNOSIS — Z881 Allergy status to other antibiotic agents status: Secondary | ICD-10-CM | POA: Diagnosis not present

## 2021-04-09 DIAGNOSIS — Z8249 Family history of ischemic heart disease and other diseases of the circulatory system: Secondary | ICD-10-CM | POA: Diagnosis not present

## 2021-04-09 HISTORY — PX: SPINAL CORD STIMULATOR REMOVAL: SHX5379

## 2021-04-09 SURGERY — LUMBAR SPINAL CORD STIMULATOR REMOVAL
Anesthesia: General

## 2021-04-09 MED ORDER — FENTANYL CITRATE (PF) 100 MCG/2ML IJ SOLN
INTRAMUSCULAR | Status: DC | PRN
  Administered 2021-04-09 (×2): 25 ug via INTRAVENOUS
  Administered 2021-04-09: 50 ug via INTRAVENOUS
  Administered 2021-04-09: 100 ug via INTRAVENOUS

## 2021-04-09 MED ORDER — SODIUM CHLORIDE 0.9 % IV SOLN: INTRAVENOUS | Status: DC | PRN

## 2021-04-09 MED ORDER — FENTANYL CITRATE (PF) 100 MCG/2ML IJ SOLN
INTRAMUSCULAR | Status: AC
  Filled 2021-04-09: qty 2

## 2021-04-09 MED ORDER — METHOCARBAMOL 500 MG PO TABS
ORAL_TABLET | ORAL | Status: AC
  Administered 2021-04-09: 500 mg via ORAL
  Filled 2021-04-09: qty 1

## 2021-04-09 MED ORDER — OXYCODONE HCL 5 MG PO TABS
ORAL_TABLET | ORAL | Status: AC
  Filled 2021-04-09: qty 1

## 2021-04-09 MED ORDER — DEXAMETHASONE SODIUM PHOSPHATE 10 MG/ML IJ SOLN
INTRAMUSCULAR | Status: DC | PRN
  Administered 2021-04-09: 10 mg via INTRAVENOUS

## 2021-04-09 MED ORDER — CEFAZOLIN SODIUM-DEXTROSE 2-4 GM/100ML-% IV SOLN
INTRAVENOUS | Status: AC
  Filled 2021-04-09: qty 100

## 2021-04-09 MED ORDER — DEXMEDETOMIDINE (PRECEDEX) IN NS 20 MCG/5ML (4 MCG/ML) IV SYRINGE
PREFILLED_SYRINGE | INTRAVENOUS | Status: DC | PRN
  Administered 2021-04-09: 8 ug via INTRAVENOUS
  Administered 2021-04-09: 12 ug via INTRAVENOUS

## 2021-04-09 MED ORDER — EPHEDRINE SULFATE 50 MG/ML IJ SOLN
INTRAMUSCULAR | Status: DC | PRN
  Administered 2021-04-09: 10 mg via INTRAVENOUS
  Administered 2021-04-09: 15 mg via INTRAVENOUS

## 2021-04-09 MED ORDER — GLYCOPYRROLATE 0.2 MG/ML IJ SOLN
INTRAMUSCULAR | Status: DC | PRN
  Administered 2021-04-09: .2 mg via INTRAVENOUS

## 2021-04-09 MED ORDER — ONDANSETRON HCL 4 MG/2ML IJ SOLN
INTRAMUSCULAR | Status: DC | PRN
  Administered 2021-04-09: 4 mg via INTRAVENOUS

## 2021-04-09 MED ORDER — ONDANSETRON HCL 4 MG/2ML IJ SOLN
INTRAMUSCULAR | Status: AC
  Filled 2021-04-09: qty 2

## 2021-04-09 MED ORDER — SUCCINYLCHOLINE CHLORIDE 200 MG/10ML IV SOSY
PREFILLED_SYRINGE | INTRAVENOUS | Status: DC | PRN
  Administered 2021-04-09: 120 mg via INTRAVENOUS

## 2021-04-09 MED ORDER — PHENYLEPHRINE HCL (PRESSORS) 10 MG/ML IV SOLN
INTRAVENOUS | Status: DC | PRN
  Administered 2021-04-09 (×2): 50 ug via INTRAVENOUS
  Administered 2021-04-09: 100 ug via INTRAVENOUS

## 2021-04-09 MED ORDER — OXYCODONE HCL 5 MG/5ML PO SOLN
5.0000 mg | Freq: Once | ORAL | Status: AC | PRN
Start: 2021-04-09 — End: 2021-04-09

## 2021-04-09 MED ORDER — METHOCARBAMOL 500 MG PO TABS: 500.0000 mg | ORAL_TABLET | Freq: Once | ORAL | Status: AC

## 2021-04-09 MED ORDER — GLYCOPYRROLATE 0.2 MG/ML IJ SOLN
INTRAMUSCULAR | Status: AC
  Filled 2021-04-09: qty 1

## 2021-04-09 MED ORDER — MIDAZOLAM HCL 2 MG/2ML IJ SOLN
INTRAMUSCULAR | Status: DC | PRN
  Administered 2021-04-09: 2 mg via INTRAVENOUS

## 2021-04-09 MED ORDER — ORAL CARE MOUTH RINSE: 15.0000 mL | Freq: Once | OROMUCOSAL | Status: DC

## 2021-04-09 MED ORDER — SUCCINYLCHOLINE CHLORIDE 200 MG/10ML IV SOSY
PREFILLED_SYRINGE | INTRAVENOUS | Status: AC
  Filled 2021-04-09: qty 10

## 2021-04-09 MED ORDER — PROPOFOL 10 MG/ML IV BOLUS
INTRAVENOUS | Status: DC | PRN
  Administered 2021-04-09: 200 mg via INTRAVENOUS

## 2021-04-09 MED ORDER — OXYCODONE HCL 5 MG PO TABS
ORAL_TABLET | ORAL | Status: AC
  Administered 2021-04-09: 5 mg via ORAL
  Filled 2021-04-09: qty 1

## 2021-04-09 MED ORDER — CHLORHEXIDINE GLUCONATE 0.12 % MT SOLN: 15.0000 mL | Freq: Once | OROMUCOSAL | Status: DC

## 2021-04-09 MED ORDER — OXYCODONE HCL 5 MG PO TABS: 5.0000 mg | ORAL_TABLET | Freq: Four times a day (QID) | ORAL | 0 refills | Status: AC | PRN

## 2021-04-09 MED ORDER — 0.9 % SODIUM CHLORIDE (POUR BTL) OPTIME
TOPICAL | Status: DC | PRN
  Administered 2021-04-09: 500 mL

## 2021-04-09 MED ORDER — CEFAZOLIN SODIUM-DEXTROSE 2-4 GM/100ML-% IV SOLN
2.0000 g | Freq: Once | INTRAVENOUS | Status: AC
  Administered 2021-04-09: 2 g via INTRAVENOUS

## 2021-04-09 MED ORDER — EPHEDRINE 5 MG/ML INJ
INTRAVENOUS | Status: AC
  Filled 2021-04-09: qty 5

## 2021-04-09 MED ORDER — LIDOCAINE HCL (CARDIAC) PF 100 MG/5ML IV SOSY
PREFILLED_SYRINGE | INTRAVENOUS | Status: DC | PRN
  Administered 2021-04-09: 50 mg via INTRAVENOUS

## 2021-04-09 MED ORDER — ACETAMINOPHEN 10 MG/ML IV SOLN: 1000.0000 mg | Freq: Once | INTRAVENOUS | Status: DC | PRN

## 2021-04-09 MED ORDER — MIDAZOLAM HCL 2 MG/2ML IJ SOLN
INTRAMUSCULAR | Status: AC
  Filled 2021-04-09: qty 2

## 2021-04-09 MED ORDER — FENTANYL CITRATE (PF) 100 MCG/2ML IJ SOLN: 25.0000 ug | INTRAMUSCULAR | Status: DC | PRN

## 2021-04-09 MED ORDER — DROPERIDOL 2.5 MG/ML IJ SOLN
0.6250 mg | Freq: Once | INTRAMUSCULAR | Status: DC | PRN
  Filled 2021-04-09: qty 2

## 2021-04-09 MED ORDER — LIDOCAINE HCL (PF) 2 % IJ SOLN
INTRAMUSCULAR | Status: AC
  Filled 2021-04-09: qty 5

## 2021-04-09 MED ORDER — BUPIVACAINE-EPINEPHRINE (PF) 0.5% -1:200000 IJ SOLN
INTRAMUSCULAR | Status: DC | PRN
  Administered 2021-04-09: 22 mL

## 2021-04-09 MED ORDER — OXYCODONE HCL 5 MG PO TABS: 5.0000 mg | ORAL_TABLET | Freq: Once | ORAL | Status: AC

## 2021-04-09 MED ORDER — KETAMINE HCL 50 MG/ML IJ SOLN
INTRAMUSCULAR | Status: DC | PRN
  Administered 2021-04-09 (×2): 25 mg via INTRAVENOUS

## 2021-04-09 MED ORDER — PROMETHAZINE HCL 25 MG/ML IJ SOLN
6.2500 mg | INTRAMUSCULAR | Status: DC | PRN
Start: 2021-04-09 — End: 2021-04-09

## 2021-04-09 MED ORDER — ROCURONIUM BROMIDE 100 MG/10ML IV SOLN
INTRAVENOUS | Status: DC | PRN
  Administered 2021-04-09: 5 mg via INTRAVENOUS

## 2021-04-09 MED ORDER — CHLORHEXIDINE GLUCONATE 0.12 % MT SOLN
OROMUCOSAL | Status: AC
  Filled 2021-04-09: qty 15

## 2021-04-09 MED ORDER — DEXAMETHASONE SODIUM PHOSPHATE 10 MG/ML IJ SOLN
INTRAMUSCULAR | Status: AC
  Filled 2021-04-09: qty 1

## 2021-04-09 MED ORDER — OXYCODONE HCL 5 MG PO TABS
5.0000 mg | ORAL_TABLET | Freq: Once | ORAL | Status: AC | PRN
Start: 2021-04-09 — End: 2021-04-09
  Administered 2021-04-09: 5 mg via ORAL

## 2021-04-09 SURGICAL SUPPLY — 74 items
ADH SKN CLS APL DERMABOND .7 (GAUZE/BANDAGES/DRESSINGS) ×2
AGENT HMST MTR 8 SURGIFLO (HEMOSTASIS)
APL PRP STRL LF DISP 70% ISPRP (MISCELLANEOUS) ×2
APL SRG 60D 8 XTD TIP BNDBL (TIP)
BLADE BOVIE TIP EXT 4 (BLADE) ×2 IMPLANT
BUR NEURO DRILL SOFT 3.0X3.8M (BURR) ×2 IMPLANT
CHLORAPREP W/TINT 26 (MISCELLANEOUS) ×3 IMPLANT
CNTNR SPEC 2.5X3XGRAD LEK (MISCELLANEOUS) ×1
CONT SPEC 4OZ STER OR WHT (MISCELLANEOUS) ×1
CONT SPEC 4OZ STRL OR WHT (MISCELLANEOUS) ×1
CONTAINER SPEC 2.5X3XGRAD LEK (MISCELLANEOUS) ×1 IMPLANT
COUNTER NEEDLE 20/40 LG (NEEDLE) ×2 IMPLANT
COVER LIGHT HANDLE STERIS (MISCELLANEOUS) ×4 IMPLANT
CUP MEDICINE 2OZ PLAST GRAD ST (MISCELLANEOUS) ×2 IMPLANT
DERMABOND ADVANCED (GAUZE/BANDAGES/DRESSINGS) ×2
DERMABOND ADVANCED .7 DNX12 (GAUZE/BANDAGES/DRESSINGS) ×1 IMPLANT
DEVICE DISSECT PLASMABLAD 3.0S (MISCELLANEOUS) IMPLANT
DEVICE DSSCT PLSMBLD 3.0S LGHT (MISCELLANEOUS) IMPLANT
DRAPE C-ARM XRAY 36X54 (DRAPES) ×4 IMPLANT
DRAPE LAPAROTOMY 100X77 ABD (DRAPES) ×2 IMPLANT
DRAPE MICROSCOPE SPINE 48X150 (DRAPES) IMPLANT
DRAPE SURG 17X11 SM STRL (DRAPES) ×2 IMPLANT
DRSG TEGADERM 4X4.75 (GAUZE/BANDAGES/DRESSINGS) ×3 IMPLANT
DRSG TELFA 3X8 NADH (GAUZE/BANDAGES/DRESSINGS) ×2 IMPLANT
DURASEAL APPLICATOR TIP (TIP) IMPLANT
DURASEAL SPINE SEALANT 3ML (MISCELLANEOUS) IMPLANT
ELECT CAUTERY BLADE TIP 2.5 (TIP) ×2
ELECT EZSTD 165MM 6.5IN (MISCELLANEOUS) ×2
ELECT REM PT RETURN 9FT ADLT (ELECTROSURGICAL) ×2
ELECTRODE CAUTERY BLDE TIP 2.5 (TIP) ×1 IMPLANT
ELECTRODE EZSTD 165MM 6.5IN (MISCELLANEOUS) ×1 IMPLANT
ELECTRODE REM PT RTRN 9FT ADLT (ELECTROSURGICAL) ×1 IMPLANT
GAUZE 4X4 16PLY ~~LOC~~+RFID DBL (SPONGE) ×2 IMPLANT
GAUZE SPONGE 4X4 12PLY STRL (GAUZE/BANDAGES/DRESSINGS) IMPLANT
GAUZE XEROFORM 1X8 LF (GAUZE/BANDAGES/DRESSINGS) ×1 IMPLANT
GLOVE SRG 8 PF TXTR STRL LF DI (GLOVE) ×1 IMPLANT
GLOVE SURG SYN 6.5 ES PF (GLOVE) ×4 IMPLANT
GLOVE SURG SYN 6.5 PF PI (GLOVE) ×2 IMPLANT
GLOVE SURG SYN 8.0 (GLOVE) ×2 IMPLANT
GLOVE SURG SYN 8.0 PF PI (GLOVE) ×1 IMPLANT
GLOVE SURG UNDER POLY LF SZ6.5 (GLOVE) ×4 IMPLANT
GLOVE SURG UNDER POLY LF SZ8 (GLOVE) ×2
GOWN SRG LRG LVL 4 IMPRV REINF (GOWNS) ×2 IMPLANT
GOWN STRL REIN LRG LVL4 (GOWNS) ×4
GOWN STRL REUS W/ TWL XL LVL3 (GOWN DISPOSABLE) ×2 IMPLANT
GOWN STRL REUS W/TWL XL LVL3 (GOWN DISPOSABLE) ×4
GRADUATE 1200CC STRL 31836 (MISCELLANEOUS) ×2 IMPLANT
KIT TURNOVER KIT A (KITS) ×2 IMPLANT
KIT WILSON FRAME (KITS) ×2 IMPLANT
MANIFOLD NEPTUNE II (INSTRUMENTS) ×2 IMPLANT
MARKER SKIN DUAL TIP RULER LAB (MISCELLANEOUS) ×3 IMPLANT
NDL SAFETY ECLIPSE 18X1.5 (NEEDLE) ×1 IMPLANT
NEEDLE HYPO 18GX1.5 SHARP (NEEDLE) ×2
NEEDLE HYPO 22GX1.5 SAFETY (NEEDLE) ×2 IMPLANT
NS IRRIG 1000ML POUR BTL (IV SOLUTION) ×2 IMPLANT
PACK LAMINECTOMY NEURO (CUSTOM PROCEDURE TRAY) ×2 IMPLANT
PAD ARMBOARD 7.5X6 YLW CONV (MISCELLANEOUS) ×2 IMPLANT
PAD DRESSING TELFA 3X8 NADH (GAUZE/BANDAGES/DRESSINGS) IMPLANT
PLASMABLADE 3.0S (MISCELLANEOUS) ×2
PLASMABLADE 3.0S W/LIGHT (MISCELLANEOUS)
SPOGE SURGIFLO 8M (HEMOSTASIS)
SPONGE SURGIFLO 8M (HEMOSTASIS) IMPLANT
STAPLER SKIN PROX 35W (STAPLE) IMPLANT
SUT ETHILON 3-0 FS-10 30 BLK (SUTURE) ×4
SUT POLYSORB 2-0 5X18 GS-10 (SUTURE) ×9 IMPLANT
SUT VIC AB 0 CT1 18XCR BRD 8 (SUTURE) ×2 IMPLANT
SUT VIC AB 0 CT1 8-18 (SUTURE) ×4
SUTURE EHLN 3-0 FS-10 30 BLK (SUTURE) ×2 IMPLANT
SYR 10ML LL (SYRINGE) ×4 IMPLANT
SYR 20ML LL LF (SYRINGE) ×2 IMPLANT
SYR 30ML LL (SYRINGE) ×4 IMPLANT
SYR 3ML LL SCALE MARK (SYRINGE) ×2 IMPLANT
TOWEL OR 17X26 4PK STRL BLUE (TOWEL DISPOSABLE) ×4 IMPLANT
TUBING CONNECTING 10 (TUBING) ×2 IMPLANT

## 2021-04-09 NOTE — Transfer of Care (Signed)
Immediate Anesthesia Transfer of Care Note  Patient: [facility]  Procedure(s) Performed: REMOVAL SPINAL CORD STIMULATOR & PULSE GENERATOR  Patient Location: PACU  Anesthesia Type:General  Level of Consciousness: awake, alert  and oriented  Airway & Oxygen Therapy: Patient Spontanous Breathing and Patient connected to face mask oxygen  Post-op Assessment: Report given to RN and Post -op Vital signs reviewed and stable  Post vital signs: Reviewed  Last Vitals:  Vitals Value Taken Time  BP    Temp    Pulse    Resp    SpO2      Last Pain:  Vitals:   04/09/21 0906  TempSrc: Temporal  PainSc: 2          Complications: No notable events documented.

## 2021-04-09 NOTE — Discharge Instructions (Addendum)
NEUROSURGERY DISCHARGE INSTRUCTIONS  Admission diagnosis: Chronic pain syndrome g89.4  Operative procedure: Removal of spinal cord stimulator  What to do after you leave the hospital:  Recommended diet: regular diet. Increase protein intake to promote wound healing.  Recommended activity: activity as tolerated. No driving for 3 weeks.You should walk multiple times per day  Special Instructions  No straining, no heavy lifting > 10lbs x 4 weeks.  Keep incision area clean and dry. May shower in 2 days. No baths or pools for 6 weeks.   You have no sutures to remove, the skin is closed with adhesive. This should come off on its own in 10-14 days.   Please take pain medications as directed. Take a stool softener if on pain medications   Please Report any of the following: Nausea or Vomiting, Temperature is greater than 101.10F (38.1C) degrees, Dizziness, Abdominal Pain, Difficulty Breathing or Shortness of Breath, Inability to Eat, drink Fluids, or Take medications, Bleeding, swelling, or drainage from surgical incision sites, New numbness or weakness, and Bowel or bladder dysfunction to the neurosurgeon on call at [phone]  Additional Follow up appointments Please follow up with [facility] in [place] [facility] as scheduled in 2-3 weeks for wound check   Please see below for scheduled appointments:  Future Appointments  Date Time Provider [place]  04/11/2021  2:15 PM Madaline Savage, PT [facility] None  04/16/2021  3:45 PM Madaline [facility], PT [facility] None  04/18/2021  4:30 PM Madaline [facility], PT [facility] None  04/19/2021  9:40 AM [name], MD [facility] None  04/23/2021  4:30 PM [facility], PT [facility] None  04/26/2021  4:30 PM Madaline [facility], PT [facility] None  06/06/2021  1:45 PM Moye, [place], MD [facility] None     AMBULATORY SURGERY  DISCHARGE INSTRUCTIONS   The drugs that you were given will stay in your system until tomorrow so for the next 24 hours you  should not:  Drive an automobile Make any legal decisions Drink any alcoholic beverage   You may resume regular meals tomorrow.  Today it is better to start with liquids and gradually work up to solid foods.  You may eat anything you prefer, but it is better to start with liquids, then soup and crackers, and gradually work up to solid foods.   Please notify your doctor immediately if you have any unusual bleeding, trouble breathing, redness and pain at the surgery site, drainage, fever, or pain not relieved by medication.     Your post-operative visit with Dr.                                       is: Date:                        Time:    Please call to schedule your post-operative visit.  Additional Instructions:

## 2021-04-09 NOTE — Anesthesia Procedure Notes (Signed)
Procedure Name: Intubation Date/Time: 04/09/2021 12:24 PM Performed by: Rolla Plate, CRNA Pre-anesthesia Checklist: Patient identified, Patient being monitored, Timeout performed, Emergency Drugs available and Suction available Patient Re-evaluated:Patient Re-evaluated prior to induction Oxygen Delivery Method: Circle system utilized Preoxygenation: Pre-oxygenation with 100% oxygen Induction Type: IV induction Ventilation: Mask ventilation without difficulty Laryngoscope Size: Mac and 3 Grade View: Grade I Tube type: Oral Tube size: 7.0 mm Number of attempts: 1 Airway Equipment and Method: Stylet and Video-laryngoscopy Placement Confirmation: ETT inserted through vocal cords under direct vision, positive ETCO2 and breath sounds checked- equal and bilateral Secured at: 21 cm Tube secured with: Tape Dental Injury: Teeth and Oropharynx as per pre-operative assessment

## 2021-04-09 NOTE — H&P (Signed)
Christine Higgins is an 65 y.o. female.   Chief Complaint: Failed spinal cord stimulator HPI: Christine Higgins is here approximately 7 months out from her spinal cord stimulator placement. She states she got great relief with the trial but with the permanent implant, she has not gotten great relief overall. She has worked with the representative and reprogram this. She does feel like it since shocking sensations that are painful into her body. The past couple months, she has had the unit turned off. Given lack of relief, she would like to have the stimulator removed.  Past Medical History:  Diagnosis Date   Anxiety    Asthma    Breast mass    LEFT x 3 months per pt and palpated by physician   Cervical disc disease    Chronic pain syndrome    COPD (chronic obstructive pulmonary disease) (HCC)    Degenerative disc disease, lumbar    osteoarthritis   Depression    Fibromyalgia    Fibromyalgia    GERD (gastroesophageal reflux disease)    Glaucoma    Graves disease    Headache    migraines   Hemorrhoids    Hyperlipidemia    Hypertension    Hyperthyroidism    Lumbar disc disease    Obesity    Pre-diabetes    Psoriatic arthritis ([place])    Thyroid disease     Past Surgical History:  Procedure Laterality Date   BACK SURGERY     sumbar   CARDIAC CATHETERIZATION     carpel tunn Right    carpel tunnel Left    CESAREAN SECTION     COLONOSCOPY     COLONOSCOPY WITH PROPOFOL N/A 10/20/2020   Procedure: COLONOSCOPY WITH PROPOFOL;  Surgeon: Lesly Rubenstein, MD;  Location: [facility];  Service: Endoscopy;  Laterality: N/A;   ESOPHAGOGASTRODUODENOSCOPY (EGD) WITH PROPOFOL N/A 06/26/2017   Procedure: ESOPHAGOGASTRODUODENOSCOPY (EGD) WITH PROPOFOL;  Surgeon: Lollie Sails, MD;  Location: [facility] ENDOSCOPY;  Service: Endoscopy;  Laterality: N/A;   EXCISION MORTON'S NEUROMA Left 02/05/2017   Procedure: EXCISION MORTON'S NEUROMA;  Surgeon: Samara Deist, DPM;  Location: [facility];   Service: Podiatry;  Laterality: Left;  iva with local   HAND SURGERY Right    scar tissue removal   JOINT REPLACEMENT     right hip arthroplasty 08/25/15   KNEE ARTHROPLASTY Left 08/11/2017   Procedure: COMPUTER ASSISTED TOTAL KNEE ARTHROPLASTY;  Surgeon: Dereck Leep, MD;  Location: [facility];  Service: Orthopedics;  Laterality: Left;   KNEE ARTHROPLASTY Right 02/05/2021   Procedure: COMPUTER ASSISTED TOTAL KNEE ARTHROPLASTY;  Surgeon: Dereck Leep, MD;  Location: [facility];  Service: Orthopedics;  Laterality: Right;   KNEE ARTHROSCOPY Right 07/02/2018   Procedure: ARTHROSCOPY KNEE, Medial and Lateral  Chondroplasty;  Surgeon: Dereck Leep, MD;  Location: [facility];  Service: Orthopedics;  Laterality: Right;   NECK SURGERY     "disk implant"   REVERSE SHOULDER ARTHROPLASTY Left 06/24/2019   Procedure: REVERSE SHOULDER ARTHROPLASTY;  Surgeon: Corky Mull, MD;  Location: [facility];  Service: Orthopedics;  Laterality: Left;   SHOULDER SURGERY Right    spur removal   THORACIC LAMINECTOMY FOR SPINAL CORD STIMULATOR N/A 08/07/2020   Procedure: THORACIC SPINAL CORD STIMULATOR VIA LAMINECTOMY, PULSE GENERATOR;  Surgeon: [name], MD;  Location: [facility];  Service: Neurosurgery;  Laterality: N/A;   TOTAL HIP ARTHROPLASTY Right    TUBAL LIGATION      Family History  Problem Relation  Age of Onset   Cancer Mother    Heart disease Father    Breast cancer Neg Hx    Social History:  reports that she quit smoking about 28 years ago. Her smoking use included cigarettes. She has never used smokeless tobacco. She reports that she does not currently use drugs. She reports that she does not drink alcohol.  Allergies:  Allergies  Allergen Reactions   Cephalexin Hives   Ibuprofen Nausea And Vomiting    Flu like symptoms   Potassium Chloride Itching   Shellfish Allergy Nausea And Vomiting   Aspirin Nausea And Vomiting    Medications Prior to Admission  Medication Sig Dispense Refill    acetaminophen (TYLENOL) 500 MG tablet Take 2 tablets (1,000 mg total) by mouth every 6 (six) hours. 30 tablet 0   Ascorbic Acid (VITAMIN C) 1000 MG tablet Take 1,000 mg by mouth daily.      atorvastatin (LIPITOR) 10 MG tablet Take 10 mg by mouth daily after supper.      Beta Carotene (VITAMIN A) 25000 UNIT capsule Take 25,000 Units by mouth 2 (two) times a week.     budesonide-formoterol (SYMBICORT) 160-4.5 MCG/ACT inhaler Inhale 2 puffs into the lungs 2 (two) times daily.     calcium carbonate (OS-CAL) 600 MG TABS tablet Take 1 tablet by mouth 2 (two) times a week.      Calcium-Magnesium-Vitamin D [id number] MG-MG-UNIT TABS Take 1 tablet by mouth daily.     celecoxib (CELEBREX) 200 MG capsule Take 200 mg by mouth 2 (two) times daily.     celecoxib (CELEBREX) 200 MG capsule Take 200 mg by mouth in the morning and at bedtime.     Cholecalciferol (VITAMIN D3) 25 MCG (1000 UT) CAPS Take 2,000 Units by mouth every evening.      Coenzyme Q10 (COQ-10) 200 MG CAPS Take 2 capsules by mouth every other day.      Cyanocobalamin (VITAMIN B-12) 5000 MCG TBDP Take 5,000 mcg by mouth every other day.      diphenhydrAMINE (BENADRYL) 25 MG tablet Take 25 mg by mouth at bedtime as needed.     DULoxetine (CYMBALTA) 30 MG capsule Take 30 mg by mouth in the morning.      DULoxetine (CYMBALTA) 60 MG capsule Take 60 mg by mouth every evening.      esomeprazole (NEXIUM) 40 MG capsule Take 40 mg by mouth 2 (two) times daily.      fluconazole (DIFLUCAN) 100 MG tablet Take 1 tablet by mouth daily as needed.     fluticasone (FLONASE) 50 MCG/ACT nasal spray Place 1 spray into both nostrils daily.      gabapentin (NEURONTIN) 300 MG capsule Take 1 capsule (300 mg total) by mouth 3 (three) times daily. 90 capsule 5   hydrALAZINE (APRESOLINE) 25 MG tablet Take 25 mg by mouth 3 (three) times daily as needed (blood pressure 160/100 or higher).      linaclotide (LINZESS) 290 MCG CAPS capsule Take 290 mcg by mouth daily as needed  (constipation).      losartan-hydrochlorothiazide (HYZAAR) 100-25 MG tablet Take 1 tablet by mouth daily.     methimazole (TAPAZOLE) 10 MG tablet Take 10 mg by mouth daily.      methocarbamol (ROBAXIN) 750 MG tablet Take 1 tablet (750 mg total) by mouth every 8 (eight) hours as needed for muscle spasms. 90 tablet 2   Multiple Vitamin (ANTI-OXIDANT PO) Take by mouth every other day.     Omega-3 Fatty  Acids (FISH OIL) 1000 MG CAPS Take 1,000 mg by mouth 2 (two) times a week.     Probiotic Product (PROBIOTIC PO) Take 1 capsule by mouth every other day.      propranolol (INDERAL) 40 MG tablet Take 40 mg by mouth 2 (two) times daily.  11   sucralfate (CARAFATE) 1 g tablet Take 1 g by mouth 2 (two) times daily.     traMADol (ULTRAM) 50 MG tablet Take 2 tablets (100 mg total) by mouth every 12 (twelve) hours as needed for severe pain. 120 tablet 1   traZODone (DESYREL) 150 MG tablet Take 75 mg by mouth at bedtime as needed for sleep.      TURMERIC PO Take 1,000 mg by mouth every other day.      VITAMIN E PO Take 400 Units by mouth every other day.      ciclopirox (LOPROX) 0.77 % cream APPLY TOPICALLY AS NEEDED. TO ABDOMEN, GROIN AND BREASTS 30 g 2   clindamycin (CLEOCIN T) 1 % lotion Apply topically 2 (two) times daily. (Patient not taking: Reported on 04/09/2021)     clobetasol ointment (TEMOVATE) 0.05 % CONTINUE TO MORE SEVERE AREAS OF RASH 1-2 TIMES DAILY FOR UP TO 2 WEEKS AT A TIME AS NEEDED FOR RASH. AVOID APPLYING TO FACE, GROIN, AND AXILLA. USE AS DIRECTED. RISK OF SKIN ATROPHY WITH LONG-TERM USE REVIEWED. 60 g 0   clonazePAM (KLONOPIN) 1 MG tablet Take 1 tablet (1 mg total) by mouth at bedtime as needed (for sleep/anxiety). 14 tablet 0   clotrimazole-betamethasone (LOTRISONE) cream Apply 1 application topically 2 (two) times daily as needed (irritation).     enoxaparin (LOVENOX) 40 MG/0.4ML injection Inject 0.4 mLs (40 mg total) into the skin daily for 14 days. 5.6 mL 0   fexofenadine (ALLEGRA)  180 MG tablet Take 180 mg by mouth daily as needed.     Fluocinolone Acetonide Body 0.01 % OIL APPLY TO SCALP TWICE DAILY AS NEEDED FOR ITCH. 118.28 mL 3   hydrOXYzine (ATARAX/VISTARIL) 10 MG tablet TAKE UP TO 3 TABLETS ('30MG'$  TOTAL) BY MOUTH AT BEDTIME AS NEEDED FOR ITCHING 30 tablet 1   Menthol, Topical Analgesic, (ICY HOT EX) Apply 1 application topically 3 (three) times daily as needed (for pain.).     oxyCODONE (OXY IR/ROXICODONE) 5 MG immediate release tablet Take 1 tablet (5 mg total) by mouth every 4 (four) hours as needed for moderate pain (pain score 4-6). 30 tablet 0   Propylene Glycol 0.6 % SOLN Place 1 drop into both eyes 2 (two) times daily.     terbinafine (LAMISIL) 250 MG tablet Take one tablet daily for 7 days. Repeat at the beginning of months 3, 5, and 7 28 tablet 0   triamcinolone ointment (KENALOG) 0.1 % Apply 1 application topically 2 (two) times daily. For 2 weeks. Avoid applying to face, groin, and axilla. Use as directed. Risk of skin atrophy with long-term use reviewed. 454 g 1    No results found for this or any previous visit (from the past 48 hour(s)). No results found.  Review of Systems Review of Systems - General ROS: Negative Respiratory ROS: Negative Cardiovascular ROS: Negative Gastrointestinal ROS: Negative Genito-Urinary ROS: Negative Musculoskeletal ROS: Positive for back pain, right knee pain Neurological ROS: Negative Dermatological ROS: Negative Blood pressure (!) 143/84, pulse 65, temperature (!) 97 F (36.1 C), temperature source Temporal, resp. rate 16, height '5\' 11"'$  (1.803 m), weight 115.2 kg, SpO2 98 %. Physical Exam  General  appearance: Alert, cooperative, in no acute distress Back: Well-healed incisions CV: Regular rate and rhythm Pulm: Clear to auscultation  Neurologic exam:  Mental status: alertness: alert, affect: normal Speech: fluent and clear Motor: grossly symmetric strength Gait: Antalgic appearing gait     Assessment/Plan Proceed with spinal stimulator removal  [name], MD 04/09/2021, 11:27 AM

## 2021-04-09 NOTE — Op Note (Signed)
Operative Note   SURGERY DATE:  04/09/2021   PRE-OP DIAGNOSIS:  Chronic pain syndrome, failed spinal cord stimulator   POST-OP DIAGNOSIS: Post-Op Diagnosis Codes: Chronic pain syndrome   Procedure(s) with comments: Thoracic Laminectomy with removal of SCS Paddle  Removal of right flank pulse generator   SURGEON:    * Malen Gauze, MD      Cooper Render, PA Assistant   ANESTHESIA: General    OPERATIVE FINDINGS: Successful removal of thoracic spinal cord stimulator   Indication Ms. Cancienne was seen in clinic on 8/2 after having a spinal cord stimulator implant previously for chronic pain.  Since that time, she has not been able to get adequate control with this and she is having new pain around implant and requests removal for pain control.  Risks including damage to spinal cord, weakness, hematoma, infection, failure of pain relief, post-operative pain, stroke, heart attack, pneumonia, and spinal cord injury were discussed. She wished to proceed.     Procedure The patient was brought to the operating room where vascular access was obtained and intubated by the anesthesia service.  Antibiotics were given.  The patient was turned prone onto a Wilson frame.  Prior incisions were marked. The patient was prepped and draped in a sterile fashion. A hard time out was performed. Local anesthetic was instilled into incisions.   The thoracic incision was opened sharply over the midline and continued through the fascia to the level of the wires and these were exposed going into the lamina above. The lamina above was exposed  Next, rongeurs were used to remove the scar tissue exposing the paddle.  Once the soft tissue was dissected with cautery, the leads and paddle were gently retracted in the caudal direction and the paddle was removed intact.  The leads were cut.  Hemostasis was achieved.   The left flank incision was opened sharply down to level of the battery and the wires were exposed.  The  battery was removed with wires.  Hemostasis was achieved. Each incision was irrigated with saline. Then, each incision was closed with combination of 0, 2-0, and 3-0 vicryls. The skin was closed with Dermabond in the thoracic area and Dermabond on the flank incision. Sterile dressings were applied. The patient was returned to supine position and extubated. The patient was seen to be moving all extremities symmetrically and was taken to PACU for recovery. The family was updated and all questions answered.   ESTIMATED BLOOD LOSS:   20 cc   SPECIMENS None   IMPLANT None     I performed the case in its entirety with assistance of PA, Ernestene Kiel, [place]

## 2021-04-09 NOTE — Interval H&P Note (Signed)
History and Physical Interval Note:  04/09/2021 11:29 AM  Christine Higgins  has presented today for surgery, with the diagnosis of Chronic pain syndrome g89.4.  The various methods of treatment have been discussed with the patient and family. After consideration of risks, benefits and other options for treatment, the patient has consented to  Procedure(s) with comments: [place] (N/A) - 2nd case as a surgical intervention.  The patient's history has been reviewed, patient examined, no change in status, stable for surgery.  I have reviewed the patient's chart and labs.  Questions were answered to the patient's satisfaction.     [name]

## 2021-04-09 NOTE — Anesthesia Preprocedure Evaluation (Addendum)
Anesthesia Evaluation  Patient identified by MRN, date of birth, ID band Patient awake    Reviewed: Allergy & Precautions, NPO status , Patient's Chart, lab work & pertinent test results  History of Anesthesia Complications Negative for: history of anesthetic complications  Airway Mallampati: II  TM Distance: >3 FB Neck ROM: Full    Dental  (+) Edentulous Upper,    Pulmonary asthma , COPD,  COPD inhaler, former smoker,    breath sounds clear to auscultation- rhonchi (-) wheezing      Cardiovascular Exercise Tolerance: Poor hypertension, Pt. on medications (-) CAD, (-) Past MI, (-) Cardiac Stents and (-) CABG  Rhythm:Regular Rate:Normal - Systolic murmurs and - Diastolic murmurs    Neuro/Psych neg Headaches, neg Seizures PSYCHIATRIC DISORDERS Anxiety Depression  Neuromuscular disease (Lumbar disc disease)    GI/Hepatic Neg liver ROS, GERD  Medicated and Controlled,  Endo/Other  diabetes, Well Controlled, Type 2Hyperthyroidism   Renal/GU negative Renal ROS     Musculoskeletal  (+) Arthritis , Fibromyalgia -  Abdominal (+) + obese,   Peds  Hematology  (+) anemia ,   Anesthesia Other Findings US Venous Img Lower Unilateral Right CLINICAL DATA:  Worsening leg swelling.  EXAM: RIGHT LOWER EXTREMITY VENOUS DOPPLER ULTRASOUND  TECHNIQUE: Gray-scale sonography with compression, as well as color and duplex ultrasound, were performed to evaluate the deep venous system(s) from the level of the common femoral vein through the popliteal and proximal calf veins.  COMPARISON:  None.  FINDINGS: VENOUS  Normal compressibility of the common femoral, superficial femoral, and popliteal veins, as well as the visualized calf veins. Visualized portions of profunda femoral vein and great saphenous vein unremarkable. No filling defects to suggest DVT on grayscale or color Doppler imaging. Doppler waveforms show normal  direction of venous flow, normal respiratory plasticity and response to augmentation.  Limited views of the contralateral common femoral vein are unremarkable.  OTHER  In the distal right thigh, superomedial to the patient's bandaging there is a complex fluid collection measuring 4.7 x 3 5 x 8.8 cm. There is a large vessel abutting the collection without substantial internal flow within the collection.  IMPRESSION: 1. No evidence of DVT. 2. In the distal right thigh, superomedial to the patient's bandaging there is a complex fluid collection measuring up to 8.8 cm, which is nonspecific but most likely represents a postoperative seroma/hematoma given the patient's recent surgery.  Electronically Signed   By: Margaretha Sheffield MD   On: 02/15/2021 06:58 DG Knee 2 Views Right CLINICAL DATA:  Worsening leg swelling up to recent surgery.  EXAM: RIGHT KNEE - 1-2 VIEW  COMPARISON:  x-ray right knee 02/05/2021  FINDINGS: No cortical erosion or destruction. No acute displaced fracture or dislocation of the knee. Prior external fixation osseous changes noted.  At least small knee joint effusion noted.  Interval increase in subcutaneus soft tissue edema overlying a right knee total arthroplasty. No surgical hardware fracture or surrounding lucency. Skin staples are again noted.  IMPRESSION: Interval increase in subcutaneus soft tissue edema and likely small joint effusion in a patient status post right knee arthroplasty. Findings concerning for infection.  Electronically Signed   By: Iven Finn M.D.   On: 02/15/2021 05:18 CT Angio Chest PE W and/or Wo Contrast CLINICAL DATA:  High probability for pulmonary embolism. Swollen leg after right knee replacement  EXAM: CT ANGIOGRAPHY CHEST WITH CONTRAST  TECHNIQUE: Multidetector CT imaging of the chest was performed using the standard protocol during bolus administration of  intravenous contrast.  Multiplanar CT image reconstructions and MIPs were obtained to evaluate the vascular anatomy.  CONTRAST:  110m OMNIPAQUE IOHEXOL 350 MG/ML SOLN  COMPARISON:  03/20/2016  FINDINGS: Cardiovascular: Satisfactory opacification of the pulmonary arteries to the segmental level. No evidence of pulmonary embolism. Normal heart size. No pericardial effusion. Coronary calcification.  Mediastinum/Nodes: Negative for adenopathy or mass  Lungs/Pleura: There is no edema, consolidation, effusion, or pneumothorax.Stable and benign 4 mm right upper lobe pulmonary nodule on 8:31.  Upper Abdomen: Negative  Musculoskeletal: Left glenohumeral arthroplasty. Generalized thoracic disc space narrowing and ridging. A lower thoracic dorsal column stimulator is present.  Review of the MIP images confirms the above findings.  IMPRESSION: Negative for pulmonary embolism or other acute finding.  Electronically Signed   By: JMonte FantasiaM.D.   On: 02/15/2021 05:08 DG Chest 2 View CLINICAL DATA:  Chest pain, dyspnea  EXAM: CHEST - 2 VIEW  COMPARISON:  06/25/2019  FINDINGS: Lungs are well expanded, symmetric, and clear. No pneumothorax or pleural effusion. Cardiac size within normal limits. Pulmonary vascularity is normal. Interval placement of dorsal column stimulator leads with leads overlying T8-9. Osseous structures are age-appropriate. No acute bone abnormality.  IMPRESSION: No active cardiopulmonary disease. Interval dorsal column stimulator lead placement.  Reproductive/Obstetrics                           Lab Results  Component Value Date   WBC 3.8 (L) 04/04/2021   HGB 10.5 (L) 04/04/2021   HCT 33.4 (L) 04/04/2021   MCV 79.3 (L) 04/04/2021   PLT 219 04/04/2021    Anesthesia Physical  Anesthesia Plan  ASA: 3  Anesthesia Plan: General   Post-op Pain Management:    Induction:   PONV Risk Score and Plan: 2 and Propofol  infusion  Airway Management Planned: Oral ETT  Additional Equipment:   Intra-op Plan:   Post-operative Plan: Extubation in OR  Informed Consent: I have reviewed the patients History and Physical, chart, labs and discussed the procedure including the risks, benefits and alternatives for the proposed anesthesia with the patient or authorized representative who has indicated his/her understanding and acceptance.     Dental advisory given  Plan Discussed with: CRNA and Anesthesiologist  Anesthesia Plan Comments:        Anesthesia Quick Evaluation

## 2021-04-10 ENCOUNTER — Encounter: Payer: Self-pay | Admitting: Neurosurgery

## 2021-04-10 NOTE — Anesthesia Postprocedure Evaluation (Signed)
Anesthesia Post Note  Patient: Christine Higgins  Procedure(s) Performed: REMOVAL SPINAL CORD STIMULATOR & PULSE GENERATOR  Patient location during evaluation: PACU Anesthesia Type: General Level of consciousness: awake and alert Pain management: pain level controlled Vital Signs Assessment: post-procedure vital signs reviewed and stable Respiratory status: spontaneous breathing, nonlabored ventilation and respiratory function stable Cardiovascular status: blood pressure returned to baseline and stable Postop Assessment: no apparent nausea or vomiting Anesthetic complications: no   No notable events documented.   Last Vitals:  Vitals:   04/09/21 1512 04/09/21 1619  BP: (!) 167/89 (!) 147/84  Pulse: 63 85  Resp:  16  Temp: (!) 36.3 C (!) 36.3 C  SpO2: 96% 100%    Last Pain:  Vitals:   04/10/21 0920  TempSrc:   PainSc: [place]

## 2021-04-11 ENCOUNTER — Encounter: Payer: Self-pay | Admitting: Anesthesiology

## 2021-04-11 ENCOUNTER — Ambulatory Visit: Payer: Medicare HMO

## 2021-04-11 ENCOUNTER — Other Ambulatory Visit: Payer: Self-pay | Admitting: Student in an Organized Health Care Education/Training Program

## 2021-04-17 ENCOUNTER — Telehealth: Payer: Medicare HMO | Admitting: Student in an Organized Health Care Education/Training Program

## 2021-04-18 ENCOUNTER — Ambulatory Visit: Payer: Medicare HMO

## 2021-04-18 DIAGNOSIS — M25561 Pain in right knee: Secondary | ICD-10-CM | POA: Diagnosis not present

## 2021-04-18 DIAGNOSIS — G8929 Other chronic pain: Secondary | ICD-10-CM

## 2021-04-18 DIAGNOSIS — R262 Difficulty in walking, not elsewhere classified: Secondary | ICD-10-CM | POA: Diagnosis not present

## 2021-04-18 DIAGNOSIS — L4050 Arthropathic psoriasis, unspecified: Secondary | ICD-10-CM | POA: Diagnosis not present

## 2021-04-18 NOTE — Therapy (Addendum)
Pt called clinic on 9/1 to cancel her appointment and asked to be DC from PT services. Pt reports her knee is feeling much improved and that she wants to reserve her PT visits for any future potential rehab needs. She continues to work on HEP at home TID. FOTO survey completed. PT will be DC from our services.   1:23 PM, 04/26/21 Etta Grandchild, PT, DPT Physical Therapist - [place] [phone] (Office)   [facility] [address], [place], [address] Phone: [phone]   Fax:  [phone]  Physical Therapy Treatment Discharge Summary   Patient Details  Name: Christine Higgins MRN: [id number] Date of Birth: 07-Sep-1955 Referring Provider (PT): Tamala Julian, [place]   Encounter Date: 04/18/2021   PT End of Session - 04/18/21 1636     Visit Number 7    Number of Visits 17    Date for PT Re-Evaluation 05/10/21    Authorization Type [employer]/ [place] Medicaid    Authorization Time Period Cert  123456; Insurance authorization 03/12/21-06/01/21 for 24 visits    PT Start Time 42    PT Stop Time 1715    PT Time Calculation (min) 45 min    Activity Tolerance Patient tolerated treatment well    Behavior During Therapy WFL for tasks assessed/performed             Past Medical History:  Diagnosis Date   Anxiety    Asthma    Breast mass    LEFT x 3 months per pt and palpated by physician   Cervical disc disease    Chronic pain syndrome    COPD (chronic obstructive pulmonary disease) (HCC)    Degenerative disc disease, lumbar    osteoarthritis   Depression    Fibromyalgia    Fibromyalgia    GERD (gastroesophageal reflux disease)    Glaucoma    Graves disease    Headache    migraines   Hemorrhoids    Hyperlipidemia    Hypertension    Hyperthyroidism    Lumbar disc disease    Obesity    Pre-diabetes    Psoriatic arthritis ([place])    Thyroid disease     Past Surgical History:   Procedure Laterality Date   BACK SURGERY     sumbar   CARDIAC CATHETERIZATION     carpel tunn Right    carpel tunnel Left    CESAREAN SECTION     COLONOSCOPY     COLONOSCOPY WITH PROPOFOL N/A 10/20/2020   Procedure: COLONOSCOPY WITH PROPOFOL;  Surgeon: Lesly Rubenstein, MD;  Location: [facility] ENDOSCOPY;  Service: Endoscopy;  Laterality: N/A;   ESOPHAGOGASTRODUODENOSCOPY (EGD) WITH PROPOFOL N/A 06/26/2017   Procedure: ESOPHAGOGASTRODUODENOSCOPY (EGD) WITH PROPOFOL;  Surgeon: Lollie Sails, MD;  Location: [facility];  Service: Endoscopy;  Laterality: N/A;   EXCISION MORTON'S NEUROMA Left 02/05/2017   Procedure: EXCISION MORTON'S NEUROMA;  Surgeon: Samara Deist, DPM;  Location: [facility];  Service: Podiatry;  Laterality: Left;  iva with local   HAND SURGERY Right    scar tissue removal   JOINT REPLACEMENT     right hip arthroplasty 08/25/15   KNEE ARTHROPLASTY Left 08/11/2017   Procedure: COMPUTER ASSISTED TOTAL KNEE ARTHROPLASTY;  Surgeon: Dereck Leep, MD;  Location: [facility] ORS;  Service: Orthopedics;  Laterality: Left;   KNEE ARTHROPLASTY Right 02/05/2021   Procedure: COMPUTER ASSISTED TOTAL KNEE ARTHROPLASTY;  Surgeon: Dereck Leep, MD;  Location: [facility] OR[facility];  Service: Orthopedics;  Laterality: Right;   KNEE ARTHROSCOPY Right 07/02/2018   Procedure: ARTHROSCOPY KNEE, Medial and Lateral  Chondroplasty;  Surgeon: Dereck Leep, MD;  Location: [facility] ORS;  Service: Orthopedics;  Laterality: Right;   NECK SURGERY     "disk implant"   REVERSE SHOULDER ARTHROPLASTY Left 06/24/2019   Procedure: REVERSE SHOULDER ARTHROPLASTY;  Surgeon: Corky Mull, MD;  Location: [facility] OR[facility];  Service: Orthopedics;  Laterality: Left;   SHOULDER SURGERY Right    spur removal   SPINAL CORD STIMULATOR REMOVAL N/A 04/09/2021   Procedure: REMOVAL SPINAL CORD STIMULATOR & PULSE GENERATOR;  Surgeon: [name], MD;  Location: [facility] OR[facility];  Service: Neurosurgery;  Laterality: N/A;  2nd case    THORACIC LAMINECTOMY FOR SPINAL CORD STIMULATOR N/A 08/07/2020   Procedure: THORACIC SPINAL CORD STIMULATOR VIA LAMINECTOMY, PULSE GENERATOR;  Surgeon: [name], MD;  Location: [facility] OR[facility];  Service: Neurosurgery;  Laterality: N/A;   TOTAL HIP ARTHROPLASTY Right    TUBAL LIGATION      There were no vitals filed for this visit.   Subjective Assessment - 04/18/21 1634     Subjective Pt reports not being in PT for 2 weeks due to spinal cord stimulator removed. Reports 7/10 pain NPS in R knee.    Currently in Pain? Yes    Pain Score 7     Pain Location Knee    Pain Orientation Right    Pain Descriptors / Indicators Aching    Pain Type Surgical pain            There.ex:  NuStep seat 10, arms 10, level 1 for 5 minutes to promote joint movement and decrease knee stiffness. Not billed.   Seated R knee ball roll outs to promote knee flexion/extension: 1x10 RLE with 10 sec holds/direction.  Side step with B UE assist 5 ft to the L and 5 ft to the R 10x   Seated R knee extension 5 lbs 10x2   Standing squats with BUE support: use of chair + pillow as tactile cue, 3x10. Noted increased Wb'ing on LLE. With PT demo and VC's pt able to correct with last set.  R standing lateral step up on wooden block: 3x10 with BUE support   R forward 6" step up:  BUE support, 1x12    PT Education - 04/18/21 1636     Education provided Yes    Education Details form/technique with there.ex    Person(s) Educated Patient    Methods Explanation;Demonstration;Tactile cues;Verbal cues    Comprehension Verbalized understanding;Returned demonstration              PT Short Term Goals - 03/12/21 1817       PT SHORT TERM GOAL #1   Title Pt will be independent with her initial HEP to improve ROM, strength, function, and ability to amblate with less difficulty.    Baseline Pt has started her HEP. (03/12/2021)    Time 3    Period Weeks    Status New    Target Date 04/05/21               PT  Long Term Goals - 03/12/21 1819       PT LONG TERM GOAL #1   Title Pt will improve R knee extension AROM to -5 degrees or less to promote ability to ambulate as well as perform standing tasks with less difficulty.    Baseline Seated L knee extension AROM -10 degrees (03/12/2021)    Time  8    Period Weeks    Status New    Target Date 05/10/21      PT LONG TERM GOAL #2   Title Pt will improve her R knee flexion AROM to 115 degrees or more to promote ability to perform transfers, negotiate stairs with less difficulty.    Baseline Seated R knee flexion AROM 101 degrees (03/12/2021)    Time 8    Period Weeks    Status New    Target Date 05/10/21      PT LONG TERM GOAL #3   Title Pt will improve R LE strength by at least 1/2 MMT grade to promote ability to perform standing tasks with less difficulty.    Baseline R LE hip flexion 4/5, abduction (sitting) 4/5, extension (sitting) 4-/5, knee flexion 4/5, knee extension 5/5 (03/12/2021)    Time 8    Period Weeks    Status New    Target Date 05/10/21      PT LONG TERM GOAL #4   Title Pt will be able to ambulate wiht SPC at least 300 ft mod I to promote return to PLOF.    Baseline Pt currently ambulates with rw (03/12/2021)    Time 8    Period Weeks    Status New    Target Date 05/10/21      PT LONG TERM GOAL #5   Title Pt will improve her R knee FOTO score by at least 10 points as a demonstration of improved function.    Baseline R knee FOTO 20 (03/12/2021)    Time 8    Period Weeks    Status New    Target Date 05/10/21                   Plan - 04/18/21 1722     Clinical Impression Statement Following primary PT POC with focus on RLE strength and knee flexion AROM to improve ability to navigate stairs and curbs with more ease. Pt required frequent, short rest breaks in sitting due to L knee pain aggravation. PT will continue to benefit from skilled PT services to progress R knee mobility and strength.    Personal Factors and  Comorbidities Comorbidity 3+;Age;Time since onset of injury/illness/exacerbation;Fitness;Past/Current Experience    Comorbidities Spinal chord stimulator, HTN, Depression, anxiety, COPD, DDD, fibromyalgia, obesity    Examination-Activity Limitations Locomotion Level;Stand;Lift;Transfers;Carry;Squat;Stairs    Stability/Clinical Decision Making Stable/Uncomplicated    Rehab Potential Fair    Clinical Impairments Affecting Rehab Potential (-) medical history; (+) motivated    PT Frequency 2x / week    PT Duration 8 weeks    PT Treatment/Interventions Neuromuscular re-education;Manual techniques;Aquatic Therapy;Electrical Stimulation;Iontophoresis '4mg'$ /ml Dexamethasone;Gait training;Stair training;Functional mobility training;Therapeutic activities;Therapeutic exercise;Balance training;Patient/family education    PT Next Visit Plan ROM, hip and knee strengthening, manual techniques, modalities PRN, gait training    PT Home Exercise Plan Medbridge Access Code [email or web address]    Consulted and Agree with Plan of Care Patient             Patient will benefit from skilled therapeutic intervention in order to improve the following deficits and impairments:  Pain, Improper body mechanics, Postural dysfunction, Difficulty walking, Decreased strength, Decreased range of motion, Decreased balance  Visit Diagnosis: Chronic pain of right knee  Difficulty in walking, not elsewhere classified     Problem List Patient Active Problem List   Diagnosis Date Noted   Total knee replacement status 02/05/2021   Piriformis syndrome of both sides 01/02/2021  Chronic pain 08/07/2020   Chronic midline thoracic back pain 03/27/2020   Psoriasis 12/22/2019   Psoriatic arthritis ([place]) 12/22/2019   Multiple thyroid nodules 10/01/2019   Urticaria, chronic 07/14/2019   History of colon polyps 07/09/2019   Status post reverse arthroplasty of shoulder, left 06/24/2019   History of lumbar laminectomy for spinal cord  decompression (L3-L4, 2015) 02/25/2019   Lumbar radiculopathy 02/25/2019   Neutropenia ([place]) 02/16/2019   Primary osteoarthritis involving multiple joints 02/16/2019   Polyarthralgia 02/02/2019   Cervical spondylosis 09/14/2018   Tachycardia 08/18/2017   Status post total left knee replacement 08/11/2017   Chronic pain syndrome 07/30/2017   Fibromyalgia 07/30/2017   Pelvic pain in female 06/24/2017   Dysuria 06/24/2017   Generalized anxiety disorder 06/23/2017   Primary insomnia 06/23/2017   Lumbar spondylosis 06/23/2017   Stenosis of intervertebral foramina 06/23/2017   Right carpal tunnel syndrome 03/27/2017   HTN, goal below 140/90 03/05/2017   Graves' orbitopathy 02/17/2017   Primary osteoarthritis of right knee 10/18/2016   Bilateral arm weakness 07/16/2016   Spinal stenosis of lumbar region 03/22/2016   Chronic low back pain 02/13/2016   Depression, major, in remission ([place]) 12/21/2015   Back pain at L4-L5 level 10/30/2015   DDD (degenerative disc disease), lumbosacral 10/30/2015   Status post total replacement of right hip 08/21/2015   Leg pain 07/28/2015   Health care maintenance 06/21/2015   Impingement syndrome of shoulder, left 10/20/2014   Severe obesity (BMI 35.0-35.9 with comorbidity) ([place]) 08/09/2014   Atherosclerosis of abdominal aorta ([place]) 07/29/2014   DM II (diabetes mellitus, type II), controlled ([place]) 04/19/2014   Hemorrhoids 03/14/2014   Osteoporosis 02/03/2014   Migraines 02/03/2014   Hyperlipidemia, unspecified 02/03/2014   GERD (gastroesophageal reflux disease) 02/03/2014    Salem Caster. Fairly IV, PT, DPT Physical Therapist- [facility]  04/18/2021, 5:25 PM  [facility] [address] [place], [address] Phone: [phone]   Fax:  [phone]  Name: Christine Higgins MRN: [id number] Date of Birth: 1955/12/06

## 2021-04-19 ENCOUNTER — Ambulatory Visit: Payer: Medicare HMO | Admitting: Student in an Organized Health Care Education/Training Program

## 2021-04-23 ENCOUNTER — Ambulatory Visit: Payer: Medicare HMO

## 2021-04-24 ENCOUNTER — Ambulatory Visit
Payer: Medicare HMO | Attending: Student in an Organized Health Care Education/Training Program | Admitting: Student in an Organized Health Care Education/Training Program

## 2021-04-24 ENCOUNTER — Encounter: Payer: Self-pay | Admitting: Student in an Organized Health Care Education/Training Program

## 2021-04-24 ENCOUNTER — Other Ambulatory Visit: Payer: Self-pay

## 2021-04-24 VITALS — BP 147/83 | HR 67 | Temp 96.6°F | Resp 16 | Ht 71.0 in | Wt 263.0 lb

## 2021-04-24 DIAGNOSIS — M47818 Spondylosis without myelopathy or radiculopathy, sacral and sacrococcygeal region: Secondary | ICD-10-CM | POA: Diagnosis not present

## 2021-04-24 DIAGNOSIS — Z9889 Other specified postprocedural states: Secondary | ICD-10-CM | POA: Diagnosis not present

## 2021-04-24 DIAGNOSIS — M5137 Other intervertebral disc degeneration, lumbosacral region: Secondary | ICD-10-CM | POA: Diagnosis not present

## 2021-04-24 DIAGNOSIS — M5416 Radiculopathy, lumbar region: Secondary | ICD-10-CM | POA: Diagnosis not present

## 2021-04-24 DIAGNOSIS — G5703 Lesion of sciatic nerve, bilateral lower limbs: Secondary | ICD-10-CM | POA: Diagnosis not present

## 2021-04-24 DIAGNOSIS — M461 Sacroiliitis, not elsewhere classified: Secondary | ICD-10-CM

## 2021-04-24 DIAGNOSIS — M545 Low back pain, unspecified: Secondary | ICD-10-CM | POA: Diagnosis not present

## 2021-04-24 DIAGNOSIS — T85192S Other mechanical complication of implanted electronic neurostimulator (electrode) of spinal cord, sequela: Secondary | ICD-10-CM

## 2021-04-24 DIAGNOSIS — G894 Chronic pain syndrome: Secondary | ICD-10-CM | POA: Diagnosis not present

## 2021-04-24 DIAGNOSIS — T85192A Other mechanical complication of implanted electronic neurostimulator (electrode) of spinal cord, initial encounter: Secondary | ICD-10-CM | POA: Insufficient documentation

## 2021-04-24 DIAGNOSIS — M961 Postlaminectomy syndrome, not elsewhere classified: Secondary | ICD-10-CM | POA: Diagnosis not present

## 2021-04-24 DIAGNOSIS — M797 Fibromyalgia: Secondary | ICD-10-CM

## 2021-04-24 DIAGNOSIS — M51379 Other intervertebral disc degeneration, lumbosacral region without mention of lumbar back pain or lower extremity pain: Secondary | ICD-10-CM

## 2021-04-24 MED ORDER — TRAMADOL HCL 50 MG PO TABS: 100.0000 mg | ORAL_TABLET | Freq: Two times a day (BID) | ORAL | 2 refills | Status: DC

## 2021-04-24 MED ORDER — GABAPENTIN 300 MG PO CAPS: 300.0000 mg | ORAL_CAPSULE | Freq: Three times a day (TID) | ORAL | 5 refills | Status: DC

## 2021-04-24 MED ORDER — METHOCARBAMOL 750 MG PO TABS: 750.0000 mg | ORAL_TABLET | Freq: Three times a day (TID) | ORAL | 2 refills | Status: DC | PRN

## 2021-04-24 NOTE — Progress Notes (Signed)
Nursing Pain Medication Assessment:  Safety precautions to be maintained throughout the outpatient stay will include: orient to surroundings, keep bed in low position, maintain call bell within reach at all times, provide assistance with transfer out of bed and ambulation.  Medication Inspection Compliance: Ms. Lugar did not comply with our request to bring her pills to be counted. She was reminded that bringing the medication bottles, even when empty, is a requirement.  Medication: None brought in. Pill/Patch Count: None available to be counted. Bottle Appearance: No container available. Did not bring bottle(s) to appointment. Filled Date: N/A Last Medication intake:  Today  Pt. Lives at assisted living, pt states meds are held by staff.

## 2021-04-24 NOTE — Progress Notes (Signed)
PROVIDER NOTE: Information contained herein reflects review and annotations entered in association with encounter. Interpretation of such information and data should be left to medically-trained personnel. Information provided to patient can be located elsewhere in the medical record under "Patient Instructions". Document created using STT-dictation technology, any transcriptional errors that may result from process are unintentional.    Patient: Christine Higgins  Service Category: E/M  Provider: [name], MD  DOB: June 11, 1956  DOS: 04/24/2021  Specialty: Interventional Pain Management  MRN: [id number]  Setting: Ambulatory outpatient  PCP: Kirk Ruths, MD  Type: Established Patient    Referring Provider: Kirk Ruths, MD  Location: Office  Delivery: Face-to-face     HPI  [name], a 65 y.o. year old female, is here today because of her Lumbar radiculopathy [M54.16]. [name] primary complain today is Back Pain (lower) Last encounter: My last encounter with her was on 03/19/21 Pertinent problems: [name] has Failed back surgical syndrome; DDD (degenerative disc disease), lumbosacral; Severe obesity (BMI 35.0-35.9 with comorbidity) ([place]); Primary osteoarthritis of right knee; DM II (diabetes mellitus, type II), controlled ([place]); Chronic pain syndrome; Fibromyalgia; and Lumbar radiculopathy on their pertinent problem list. Pain Assessment: Severity of Chronic pain is reported as a 6 /10. Location: Back Left/to knee - sometimes to left toes; NEW PAIN "right buttock around to pelvic area down to front thigh and sometimes down to right calf/shin". Onset: More than a month ago. Quality: Nagging, Stabbing, Sore. Timing: Constant. Modifying factor(s): "resting, ice, heat, pain meds, and staying off my feet". Vitals:  height is $RemoveB'5\' 11"'EKHlujeR$  (1.803 m) and weight is 263 lb (119.3 kg). Her temporal temperature is 96.6 F (35.9 C) (abnormal). Her blood pressure is 147/83 (abnormal) and her  pulse is 67. Her respiration is 16 and oxygen saturation is 99%.   Reason for encounter:  PPE  -right sided SI-J pain/ right piriformis pain greater than left -Had her spinal cord stimulator explanted since it was not helping.  Patient states that she is doing better since -Has moved into assisted living at the [facility].  Is enjoying it.  Feels less stressed there and states that it is better for her mental health as well. -good relief from Caudal as detailed below: -Now having increased) the left SI joint pain, piriformis pain.  Discussed SI joint and piriformis release exercises that she can perform. -We also discussed a diagnostic bilateral sacroiliac joint injection and bilateral piriformis trigger point injection under fluoroscopy.  Risks and benefits reviewed and patient would like to proceed with that.  Post-Procedure Evaluation  Procedure (03/19/2021):   Type: Palliative Epidural Steroid Injection          Region: Caudal Level: Sacrococcygeal   Laterality: Midline  Anxiolysis: Please see nurses note.  Effectiveness during initial hour after procedure (Ultra-Short Term Relief): 100 %   Local anesthetic used: Long-acting (4-6 hours) Effectiveness: Defined as any analgesic benefit obtained secondary to the administration of local anesthetics. This carries significant diagnostic value as to the etiological location, or anatomical origin, of the pain. Duration of benefit is expected to coincide with the duration of the local anesthetic used.  Effectiveness during initial 4-6 hours after procedure (Short-Term Relief): 100 %   Long-term benefit: Defined as any relief past the pharmacologic duration of the local anesthetics.  Effectiveness past the initial 6 hours after procedure (Long-Term Relief): 100 % (X 3 weeks)   Benefits, current: Defined as benefit present at the time of this evaluation.  Analgesia:  60-70% radiating radicular pain Function: Somewhat  improved    Pharmacotherapy Assessment  Analgesic: Tramadol 100 mg twice daily, quantity 120/month MME equals 20   Monitoring: [place] PMP: PDMP reviewed during this encounter.       Pharmacotherapy: No side-effects or adverse reactions reported. Compliance: No problems identified. Effectiveness: Clinically acceptable.  Nonah Mattes, RN  04/24/2021  1:12 PM  Sign when Signing Visit Nursing Pain Medication Assessment:  Safety precautions to be maintained throughout the outpatient stay will include: orient to surroundings, keep bed in low position, maintain call bell within reach at all times, provide assistance with transfer out of bed and ambulation.  Medication Inspection Compliance: Ms. Pieratt did not comply with our request to bring her pills to be counted. She was reminded that bringing the medication bottles, even when empty, is a requirement.  Medication: None brought in. Pill/Patch Count: None available to be counted. Bottle Appearance: No container available. Did not bring bottle(s) to appointment. Filled Date: N/A Last Medication intake:  Today  Pt. Lives at assisted living, pt states meds are held by staff.    UDS:  Summary  Date Value Ref Range Status  09/29/2019 Note  Final    Comment:    ==================================================================== Compliance Drug Analysis, Ur ==================================================================== Test                             Result       Flag       Units Drug Present and Declared for Prescription Verification   7-aminoclonazepam              65           EXPECTED   ng/mg creat    7-aminoclonazepam is an expected metabolite of clonazepam. Source of    clonazepam is a scheduled prescription medication.   Tramadol                       >4310        EXPECTED   ng/mg creat   O-Desmethyltramadol            >4310        EXPECTED   ng/mg creat   N-Desmethyltramadol            3447         EXPECTED   ng/mg creat     Source of tramadol is a prescription medication. O-desmethyltramadol    and N-desmethyltramadol are expected metabolites of tramadol.   Gabapentin                     PRESENT      EXPECTED   Cyclobenzaprine                PRESENT      EXPECTED   Desmethylcyclobenzaprine       PRESENT      EXPECTED    Desmethylcyclobenzaprine is an expected metabolite of    cyclobenzaprine.   Duloxetine                     PRESENT      EXPECTED   Trazodone                      PRESENT      EXPECTED   1,3 chlorophenyl piperazine    PRESENT      EXPECTED  1,3-chlorophenyl piperazine is an expected metabolite of trazodone.   Diphenhydramine                PRESENT      EXPECTED   Propranolol                    PRESENT      EXPECTED Drug Absent but Declared for Prescription Verification   Oxycodone                      Not Detected UNEXPECTED ng/mg creat   Acetaminophen                  Not Detected UNEXPECTED    Acetaminophen, as indicated in the declared medication list, is not    always detected even when used as directed.   Hydroxyzine                    Not Detected UNEXPECTED ==================================================================== Test                      Result    Flag   Units      Ref Range   Creatinine              116              mg/dL      >=20 ==================================================================== Declared Medications:  The flagging and interpretation on this report are based on the  following declared medications.  Unexpected results may arise from  inaccuracies in the declared medications.  **Note: The testing scope of this panel includes these medications:  Clonazepam  Cyclobenzaprine (Flexeril)  Diphenhydramine (Benadryl)  Duloxetine (Cymbalta)  Gabapentin (Neurontin)  Hydroxyzine  Oxycodone (Roxicodone)  Propranolol (Inderal)  Tramadol (Ultram)  Trazodone (Desyrel)  **Note: The testing scope of this panel does not include small to  moderate amounts of  these reported medications:  Acetaminophen  **Note: The testing scope of this panel does not include the  following reported medications:  Apixaban  Atorvastatin (Lipitor)  Betamethasone (Lotrisone)  Budesonide (Symbicort)  Calcium  Ciprofloxacin (Cipro)  Clobetasol  Clotrimazole (Lotrisone)  Esomeprazole (Nexium)  Eye Drops  Formoterol (Symbicort)  Hydralazine (Apresoline)  Hydrochlorothiazide (Hyzaar)  Iron  Losartan (Hyzaar)  Menthol  Multivitamin  Probiotic  Sodium Chloride  Sucralfate (Carafate)  Turmeric  Ubiquinone (CoQ10)  Vitamin B12  Vitamin C  Vitamin D3  Vitamin E ==================================================================== For clinical consultation, please call [phone]. ====================================================================      ROS  Constitutional: Denies any fever or chills Gastrointestinal: No reported hemesis, hematochezia, vomiting, or acute GI distress Musculoskeletal:  Lower back and bilateral sacroiliac joint, bilateral piriformis pain Neurological: No reported episodes of acute onset apraxia, aphasia, dysarthria, agnosia, amnesia, paralysis, loss of coordination, or loss of consciousness  Medication Review  Calcium-Magnesium-Vitamin D, CoQ-10, DULoxetine, Fish Oil, Fluocinolone Acetonide Body, Menthol (Topical Analgesic), Multiple Vitamin, Probiotic Product, Propylene Glycol, Turmeric, Vitamin B-12, Vitamin D3, Vitamin E, acetaminophen, atorvastatin, budesonide-formoterol, calcium carbonate, celecoxib, ciclopirox, clindamycin, clobetasol ointment, clonazePAM, clotrimazole-betamethasone, diphenhydrAMINE, enoxaparin, esomeprazole, fexofenadine, fluconazole, fluticasone, gabapentin, hydrALAZINE, hydrOXYzine, linaclotide, losartan-hydrochlorothiazide, methimazole, methocarbamol, propranolol, sucralfate, traMADol, traZODone, vitamin A, and vitamin C  History Review  Allergy: Ms. Williams is allergic to cephalexin, ibuprofen,  potassium chloride, shellfish allergy, and aspirin. Drug: Ms. Landino  reports that she does not currently use drugs. Alcohol:  reports no history of alcohol use. Tobacco:  reports that she quit smoking about 28 years ago. Her  smoking use included cigarettes. She has never used smokeless tobacco. Social: Ms. Carles  reports that she quit smoking about 28 years ago. Her smoking use included cigarettes. She has never used smokeless tobacco. She reports that she does not currently use drugs. She reports that she does not drink alcohol. Medical:  has a past medical history of Anxiety, Asthma, Breast mass, Cervical disc disease, Chronic pain syndrome, COPD (chronic obstructive pulmonary disease) (HCC), Degenerative disc disease, lumbar, Depression, Fibromyalgia, Fibromyalgia, GERD (gastroesophageal reflux disease), Glaucoma, Graves disease, Headache, Hemorrhoids, Hyperlipidemia, Hypertension, Hyperthyroidism, Lumbar disc disease, Obesity, Pre-diabetes, Psoriatic arthritis ([place]), and Thyroid disease. Surgical: Ms. Beery  has a past surgical history that includes carpel tunn (Right); Hand surgery (Right); carpel tunnel (Left); Cesarean section; Shoulder surgery (Right); Back surgery; Neck surgery; Total hip arthroplasty (Right); Excision Morton's neuroma (Left, 02/05/2017); Colonoscopy; Joint replacement; Esophagogastroduodenoscopy (egd) with propofol (N/A, 06/26/2017); Knee Arthroplasty (Left, 08/11/2017); Knee arthroscopy (Right, 07/02/2018); Reverse shoulder arthroplasty (Left, 06/24/2019); Thoracic laminectomy for spinal cord stimulator (N/A, 08/07/2020); Colonoscopy with propofol (N/A, 10/20/2020); Cardiac catheterization; Tubal ligation; Knee Arthroplasty (Right, 02/05/2021); and Spinal cord stimulator removal (N/A, 04/09/2021). Family: family history includes Cancer in her mother; Heart disease in her father.  Laboratory Chemistry Profile   Renal Lab Results  Component Value Date   BUN 7 (L) 04/04/2021    CREATININE 0.77 04/04/2021   BCR 11 (L) 04/27/2020   GFRAA 83 04/27/2020   GFRNONAA >60 04/04/2021    Hepatic Lab Results  Component Value Date   AST 21 01/25/2021   ALT 20 01/25/2021   ALBUMIN 4.3 01/25/2021   ALKPHOS 81 01/25/2021   LIPASE 27 12/14/2016    Electrolytes Lab Results  Component Value Date   NA 139 04/04/2021   K 4.3 04/04/2021   CL 102 04/04/2021   CALCIUM 9.7 04/04/2021   MG 1.9 08/27/2014    Bone No results found for: VD25OH, VD125OH2TOT, BS9628ZM6, QH4765YY5, 25OHVITD1, 25OHVITD2, 25OHVITD3, TESTOFREE, TESTOSTERONE  Inflammation (CRP: Acute Phase) (ESR: Chronic Phase) Lab Results  Component Value Date   CRP 0.5 01/25/2021   ESRSEDRATE 6 01/25/2021         Note: Above Lab results reviewed.  Recent Imaging Review  DG PAIN CLINIC C-ARM 1-60 MIN NO REPORT Fluoro was used, but no Radiologist interpretation will be provided.  Please refer to "NOTES" tab for provider progress note. Note: Reviewed        Physical Exam  General appearance: Well nourished, well developed, and well hydrated. In no apparent acute distress Mental status: Alert, oriented x 3 (person, place, & time)       Respiratory: No evidence of acute respiratory distress Eyes: PERLA Vitals: BP (!) 147/83   Pulse 67   Temp (!) 96.6 F (35.9 C) (Temporal)   Resp 16   Ht $R'5\' 11"'te$  (1.803 m)   Wt 263 lb (119.3 kg)   SpO2 99%   BMI 36.68 kg/m  BMI: Estimated body mass index is 36.68 kg/m as calculated from the following:   Height as of this encounter: $RemoveBeforeD'5\' 11"'IZXrLuAKhaNueu$  (1.803 m).   Weight as of this encounter: 263 lb (119.3 kg). Ideal: Ideal body weight: 70.8 kg (156 lb 1.4 oz) Adjusted ideal body weight: 90.2 kg (198 lb 13.6 oz)  Lumbar Spine Area Exam  Skin & Axial Inspection: Well healed scar from previous spine surgery detected, IPG present no erythema or induration noted Alignment: Symmetrical Functional ROM: Pain restricted ROM       Stability: No instability detected Muscle  Tone/Strength:  Functionally intact. No obvious neuro-muscular anomalies detected. Sensory (Neurological): Dermatomal pain pattern  Provocative Tests:  Patrick's Maneuver: Positive for bilateral SI joint arthralgia, right greater than left FABER* test: Positive for bilateral SI joint arthralgia, right greater than left S-I anterior distraction/compression test: Positive for bilateral SI joint arthralgia, right greater than left S-I Gaenslen's test: Positive for bilateral SI joint arthralgia, right greater than left *(Flexion, ABduction and External Rotation)    Lower Extremity Exam      Side: Right lower extremity   Side: Left lower extremity  Stability: No instability observed           Stability: No instability observed          Skin & Extremity Inspection: Skin color, temperature, and hair growth are WNL. No peripheral edema or cyanosis. No masses, redness, swelling, asymmetry, or associated skin lesions. No contractures.   Skin & Extremity Inspection: Skin color, temperature, and hair growth are WNL. No peripheral edema or cyanosis. No masses, redness, swelling, asymmetry, or associated skin lesions. No contractures.  Functional ROM: Pain restricted for the hip and knee joint                 Functional ROM: Pain restricted ROM for hip and knee joints          Muscle Tone/Strength: Functionally intact. No obvious neuro-muscular anomalies detected.   Muscle Tone/Strength: Functionally intact. No obvious neuro-muscular anomalies detected.  Sensory (Neurological): Neurogenic, arthropathic   Sensory (Neurological): Dermatomal pain pattern        DTR: Patellar: deferred today Achilles: deferred today Plantar: deferred today   DTR: Patellar: deferred today Achilles: deferred today Plantar: deferred today  Palpation: No palpable anomalies   Palpation: No palpable anomalies    Assessment   Status Diagnosis  Responding Having a Flare-up Having a Flare-up 1. Lumbar radiculopathy   2.  Arthritis of sacroiliac joint of both sides   3. Piriformis syndrome of both sides   4. History of lumbar laminectomy for spinal cord decompression   5. Failed back surgical syndrome   6. Back pain at L4-L5 level   7. DDD (degenerative disc disease), lumbosacral   8. Failure of spinal cord stimulator, sequela   9. Fibromyalgia   10. Chronic pain syndrome      Updated Problems: Problem  Failed Back Surgical Syndrome  Arthritis of Sacroiliac Joint of Both Sides  Failed Spinal Cord Stimulator ([facility])    Plan of Care   [name] has a current medication list which includes the following long-term medication(s): atorvastatin, budesonide-formoterol, calcium carbonate, clonazepam, duloxetine, duloxetine, fexofenadine, fluticasone, hydralazine, linaclotide, losartan-hydrochlorothiazide, methimazole, propranolol, trazodone, enoxaparin, and gabapentin.  Pharmacotherapy (Medications Ordered): Meds ordered this encounter  Medications   traMADol (ULTRAM) 50 MG tablet    Sig: Take 2 tablets (100 mg total) by mouth every 12 (twelve) hours.    Dispense:  120 tablet    Refill:  2    Fill one day early if pharmacy is closed on scheduled refill date.   methocarbamol (ROBAXIN) 750 MG tablet    Sig: Take 1 tablet (750 mg total) by mouth every 8 (eight) hours as needed for muscle spasms.    Dispense:  90 tablet    Refill:  2    Do not place this medication, or any other prescription from our practice, on "Automatic Refill". Patient may have prescription filled one day early if pharmacy is closed on scheduled refill date.   gabapentin (NEURONTIN) 300 MG capsule  Sig: Take 1 capsule (300 mg total) by mouth 3 (three) times daily.    Dispense:  90 capsule    Refill:  5    Orders:  Orders Placed This Encounter  Procedures   SACROILIAC JOINT INJECTION    Standing Status:   Future    Standing Expiration Date:   05/25/2021    Scheduling Instructions:     Side: Bilateral     Sedation: PO  Valium     Timeframe: ASAP    Order Specific Question:   Where will this procedure be performed?    Answer:   [facility]   TRIGGER POINT INJECTION    Area: Buttocks region (gluteal area) Indications: Piriformis muscle pain;  B/l  piriformis-syndrome; piriformis muscle spasms (H15.056). CPT code: 20552    Scheduling Instructions:     Type: Myoneural block (TPI) of piriformis muscle.     Side:  B/L     Sedation: Patient's choice.     Timeframe: Today    Order Specific Question:   Where will this procedure be performed?    Answer:   [facility]  Also presented patient's with SI joint and piriformis release exercises as she can do.  Status post spinal cord stimulator explant  Follow-up plan:   Return in about 8 days (around 05/02/2021) for B/L SI-J, Piriformis , minimal sedation (PO Valium).    Recent Visits Date Type Provider Dept  03/19/21 Procedure visit [name], MD [facility]  02/27/21 Telemedicine [name], MD [facility]  02/19/21 Telemedicine [name], MD [facility]  Showing recent visits within past 90 days and meeting all other requirements Today's Visits Date Type Provider Dept  04/24/21 Office Visit [name], MD [facility]  Showing today's visits and meeting all other requirements Future Appointments No visits were found meeting these conditions. Showing future appointments within next 90 days and meeting all other requirements I discussed the assessment and treatment plan with the patient. The patient was provided an opportunity to ask questions and all were answered. The patient agreed with the plan and demonstrated an understanding of the instructions.  Patient advised to call back or seek an in-person evaluation if the symptoms or condition worsens.  Duration of encounter: 64minutes.  Note by: [name], MD Date: 04/24/2021; Time: 1:36 PM

## 2021-04-26 ENCOUNTER — Ambulatory Visit: Payer: Medicare HMO | Admitting: Student in an Organized Health Care Education/Training Program

## 2021-04-26 ENCOUNTER — Ambulatory Visit: Payer: Medicare HMO

## 2021-04-26 ENCOUNTER — Other Ambulatory Visit: Admission: RE | Admit: 2021-04-26 | Payer: Medicare HMO | Source: Ambulatory Visit

## 2021-05-02 DIAGNOSIS — R200 Anesthesia of skin: Secondary | ICD-10-CM | POA: Diagnosis not present

## 2021-05-02 DIAGNOSIS — E0500 Thyrotoxicosis with diffuse goiter without thyrotoxic crisis or storm: Secondary | ICD-10-CM | POA: Diagnosis not present

## 2021-05-02 DIAGNOSIS — B370 Candidal stomatitis: Secondary | ICD-10-CM | POA: Diagnosis not present

## 2021-05-02 DIAGNOSIS — N760 Acute vaginitis: Secondary | ICD-10-CM | POA: Diagnosis not present

## 2021-05-02 DIAGNOSIS — E119 Type 2 diabetes mellitus without complications: Secondary | ICD-10-CM | POA: Diagnosis not present

## 2021-05-02 DIAGNOSIS — E042 Nontoxic multinodular goiter: Secondary | ICD-10-CM | POA: Diagnosis not present

## 2021-05-02 DIAGNOSIS — R208 Other disturbances of skin sensation: Secondary | ICD-10-CM | POA: Diagnosis not present

## 2021-05-07 ENCOUNTER — Other Ambulatory Visit: Payer: Self-pay

## 2021-05-07 ENCOUNTER — Ambulatory Visit (HOSPITAL_BASED_OUTPATIENT_CLINIC_OR_DEPARTMENT_OTHER): Payer: Medicare HMO | Admitting: Student in an Organized Health Care Education/Training Program

## 2021-05-07 ENCOUNTER — Ambulatory Visit
Admission: RE | Admit: 2021-05-07 | Discharge: 2021-05-07 | Disposition: A | Discharge status: Home / Self Care | Payer: Medicare HMO | Source: Ambulatory Visit | Attending: Student in an Organized Health Care Education/Training Program | Admitting: Student in an Organized Health Care Education/Training Program

## 2021-05-07 ENCOUNTER — Encounter: Payer: Self-pay | Admitting: Student in an Organized Health Care Education/Training Program

## 2021-05-07 VITALS — BP 154/93 | HR 72 | Temp 97.3°F | Resp 15 | Ht 71.0 in | Wt 263.0 lb

## 2021-05-07 DIAGNOSIS — G5703 Lesion of sciatic nerve, bilateral lower limbs: Secondary | ICD-10-CM

## 2021-05-07 DIAGNOSIS — G894 Chronic pain syndrome: Secondary | ICD-10-CM | POA: Insufficient documentation

## 2021-05-07 DIAGNOSIS — M47818 Spondylosis without myelopathy or radiculopathy, sacral and sacrococcygeal region: Secondary | ICD-10-CM

## 2021-05-07 MED ORDER — DEXAMETHASONE SODIUM PHOSPHATE 10 MG/ML IJ SOLN
10.0000 mg | Freq: Once | INTRAMUSCULAR | Status: AC
  Administered 2021-05-07: 10 mg
  Filled 2021-05-07: qty 1

## 2021-05-07 MED ORDER — DIAZEPAM 5 MG PO TABS
5.0000 mg | ORAL_TABLET | ORAL | Status: AC
  Administered 2021-05-07: 5 mg via ORAL

## 2021-05-07 MED ORDER — ROPIVACAINE HCL 2 MG/ML IJ SOLN
9.0000 mL | Freq: Once | INTRAMUSCULAR | Status: AC
  Administered 2021-05-07: 9 mL via PERINEURAL

## 2021-05-07 MED ORDER — LIDOCAINE HCL (PF) 2 % IJ SOLN
INTRAMUSCULAR | Status: AC
  Filled 2021-05-07: qty 5

## 2021-05-07 MED ORDER — DIAZEPAM 5 MG PO TABS
ORAL_TABLET | ORAL | Status: AC
  Filled 2021-05-07: qty 1

## 2021-05-07 MED ORDER — METHYLPREDNISOLONE ACETATE 80 MG/ML IJ SUSP
80.0000 mg | Freq: Once | INTRAMUSCULAR | Status: DC
  Filled 2021-05-07: qty 1

## 2021-05-07 MED ORDER — IOHEXOL 180 MG/ML  SOLN
10.0000 mL | Freq: Once | INTRAMUSCULAR | Status: AC
  Administered 2021-05-07: 10 mL via INTRA_ARTICULAR
  Filled 2021-05-07: qty 20

## 2021-05-07 MED ORDER — LIDOCAINE HCL 2 % IJ SOLN
20.0000 mL | Freq: Once | INTRAMUSCULAR | Status: AC
  Administered 2021-05-07: 400 mg

## 2021-05-07 NOTE — Progress Notes (Signed)
PROVIDER NOTE: Information contained herein reflects review and annotations entered in association with encounter. Interpretation of such information and data should be left to medically-trained personnel. Information provided to patient can be located elsewhere in the medical record under "Patient Instructions". Document created using STT-dictation technology, any transcriptional errors that may result from process are unintentional.    Patient: Christine Higgins  Service Category: Procedure  Provider: [name], MD  DOB: 02/20/1956  DOS: 05/07/2021  Location: [facility]  MRN: [id number]  Setting: Ambulatory - outpatient  Referring Provider: [name], MD  Type: Established Patient  Specialty: Interventional Pain Management  PCP: Kirk Ruths, MD   Primary Reason for Visit: Interventional Pain Management Treatment. CC: Other (Right buttocks. )    Procedure:          Anesthesia, Analgesia, Anxiolysis:  Type: Diagnostic RIGHT Sacroiliac Joint Steroid Injection #1 and RIGHT piriformis trigger point injection #1 Region: Inferior Lumbosacral Region Level: PIIS (Posterior Inferior Iliac Spine) Laterality: Right  Type: Local Anesthesia with p.o. Valium Local Anesthetic: Lidocaine 1-2% Sedation: Minimal Anxiolysis  Indication(s): Anxiety & Analgesia Route: Infiltration ([place]/IM) IV Access: Available   Position: Prone           Indications: 1. Piriformis syndrome of both sides   2. Chronic pain syndrome   3. Arthritis of sacroiliac joint of both sides    Pain Score: Pre-procedure: 8 /10 Post-procedure: 2 /10     Pre-op H&P Assessment:  Christine Higgins is a 65 y.o. (year old), female patient, seen today for interventional treatment. She  has a past surgical history that includes carpel tunn (Right); Hand surgery (Right); carpel tunnel (Left); Cesarean section; Shoulder surgery (Right); Back surgery; Neck surgery; Total hip arthroplasty (Right); Excision Morton's neuroma  (Left, 02/05/2017); Colonoscopy; Joint replacement; Esophagogastroduodenoscopy (egd) with propofol (N/A, 06/26/2017); Knee Arthroplasty (Left, 08/11/2017); Knee arthroscopy (Right, 07/02/2018); Reverse shoulder arthroplasty (Left, 06/24/2019); Thoracic laminectomy for spinal cord stimulator (N/A, 08/07/2020); Colonoscopy with propofol (N/A, 10/20/2020); Cardiac catheterization; Tubal ligation; Knee Arthroplasty (Right, 02/05/2021); and Spinal cord stimulator removal (N/A, 04/09/2021). Christine Higgins has a current medication list which includes the following prescription(s): acetaminophen, vitamin c, atorvastatin, vitamin a, budesonide-formoterol, calcium carbonate, celecoxib, vitamin d3, ciclopirox, clobetasol ointment, clonazepam, coq-10, vitamin b-12, diphenhydramine, duloxetine, duloxetine, esomeprazole, fexofenadine, fluocinolone acetonide body, fluticasone, gabapentin, hydralazine, hydroxyzine, linaclotide, losartan-hydrochlorothiazide, menthol (topical analgesic), methimazole, methocarbamol, multiple vitamin, probiotic product, propranolol, propylene glycol, sucralfate, tramadol, trazodone, turmeric, vitamin e, calcium-magnesium-vitamin d, celecoxib, clindamycin, clotrimazole-betamethasone, enoxaparin, fluconazole, and fish oil, and the following Facility-Administered Medications: methylprednisolone acetate. Her primarily concern today is the Other (Right buttocks. )  Initial Vital Signs:  Pulse/HCG Rate: 79  Temp: (!) 97.3 F (36.3 C) Resp: 16 BP: (!) 159/86 SpO2: 98 %  BMI: Estimated body mass index is 36.68 kg/m as calculated from the following:   Height as of this encounter: '5\' 11"'$  (1.803 m).   Weight as of this encounter: 263 lb (119.3 kg).  Risk Assessment: Allergies: Reviewed. She is allergic to cephalexin, ibuprofen, potassium chloride, shellfish allergy, and aspirin.  Allergy Precautions: None required Coagulopathies: Reviewed. None identified.  Blood-thinner therapy: None at this time Active  Infection(s): Reviewed. None identified. [name] is afebrile  Site Confirmation: Christine Higgins was asked to confirm the procedure and laterality before marking the site Procedure checklist: Completed Consent: Before the procedure and under the influence of no sedative(s), amnesic(s), or anxiolytics, the patient was informed of the treatment options, risks and possible complications. To fulfill our ethical and legal obligations,  as recommended by the American Medical Association's Code of Ethics, I have informed the patient of my clinical impression; the nature and purpose of the treatment or procedure; the risks, benefits, and possible complications of the intervention; the alternatives, including doing nothing; the risk(s) and benefit(s) of the alternative treatment(s) or procedure(s); and the risk(s) and benefit(s) of doing nothing. The patient was provided information about the general risks and possible complications associated with the procedure. These may include, but are not limited to: failure to achieve desired goals, infection, bleeding, organ or nerve damage, allergic reactions, paralysis, and death. In addition, the patient was informed of those risks and complications associated to the procedure, such as failure to decrease pain; infection; bleeding; organ or nerve damage with subsequent damage to sensory, motor, and/or autonomic systems, resulting in permanent pain, numbness, and/or weakness of one or several areas of the body; allergic reactions; (i.e.: anaphylactic reaction); and/or death. Furthermore, the patient was informed of those risks and complications associated with the medications. These include, but are not limited to: allergic reactions (i.e.: anaphylactic or anaphylactoid reaction(s)); adrenal axis suppression; blood sugar elevation that in diabetics may result in ketoacidosis or comma; water retention that in patients with history of congestive heart failure may result in shortness  of breath, pulmonary edema, and decompensation with resultant heart failure; weight gain; swelling or edema; medication-induced neural toxicity; particulate matter embolism and blood vessel occlusion with resultant organ, and/or nervous system infarction; and/or aseptic necrosis of one or more joints. Finally, the patient was informed that Medicine is not an exact science; therefore, there is also the possibility of unforeseen or unpredictable risks and/or possible complications that may result in a catastrophic outcome. The patient indicated having understood very clearly. We have given the patient no guarantees and we have made no promises. Enough time was given to the patient to ask questions, all of which were answered to the patient's satisfaction. Ms. Vilchis has indicated that she wanted to continue with the procedure. Attestation: I, the ordering provider, attest that I have discussed with the patient the benefits, risks, side-effects, alternatives, likelihood of achieving goals, and potential problems during recovery for the procedure that I have provided informed consent. Date  Time: 05/07/2021 11:22 AM  Pre-Procedure Preparation:  Monitoring: As per clinic protocol. Respiration, ETCO2, SpO2, BP, heart rate and rhythm monitor placed and checked for adequate function Safety Precautions: Patient was assessed for positional comfort and pressure points before starting the procedure. Time-out: I initiated and conducted the "Time-out" before starting the procedure, as per protocol. The patient was asked to participate by confirming the accuracy of the "Time Out" information. Verification of the correct person, site, and procedure were performed and confirmed by me, the nursing staff, and the patient. "Time-out" conducted as per Joint Commission's Universal Protocol (UP.01.01.01). Time: 1150  Description of Procedure:          Target Area: Inferior, posterior, aspect of the sacroiliac fissure Approach:  Posterior, paraspinal, ipsilateral approach. Area Prepped: Entire Lower Lumbosacral Region DuraPrep (Iodine Povacrylex [0.7% available iodine] and Isopropyl Alcohol, 74% w/w) Safety Precautions: Aspiration looking for blood return was conducted prior to all injections. At no point did we inject any substances, as a needle was being advanced. No attempts were made at seeking any paresthesias. Safe injection practices and needle disposal techniques used. Medications properly checked for expiration dates. SDV (single dose vial) medications used. Description of the Procedure: Protocol guidelines were followed. The patient was placed in position over the procedure table.  The target area was identified and the area prepped in the usual manner. Skin & deeper tissues infiltrated with local anesthetic. Appropriate amount of time allowed to pass for local anesthetics to take effect. The procedure needle was advanced under fluoroscopic guidance into the sacroiliac joint until a firm endpoint was obtained. Proper needle placement secured. Negative aspiration confirmed. Solution injected in intermittent fashion, asking for systemic symptoms every 0.5cc of injectate. The needles were then removed and the area cleansed, making sure to leave some of the prepping solution back to take advantage of its long term bactericidal properties. Vitals:   05/07/21 1128 05/07/21 1145 05/07/21 1150 05/07/21 1155  BP: (!) 159/86 (!) 128/92 (!) 154/93   Pulse: 79 73 73 72  Resp: '16 17 16 15  '$ Temp: (!) 97.3 F (36.3 C)     TempSrc: Temporal     SpO2: 98% 97% 98% 96%  Weight: 263 lb (119.3 kg)     Height: '5\' 11"'$  (1.803 m)       Start Time: 1150 hrs. End Time:   hrs. Materials:  Needle(s) Type: Spinal Needle Gauge: 22G Length: 3.5-in Medication(s): Please see orders for medications and dosing details. 5cc solution made of 4 cc of 0.2% ropivacaine, 1 cc of methylprednisolone, 80 mg/cc.  5 cc injected into right SI-J after  contrast confirmation.  Afterwards a RIGHT piriformis trigger point injection was also performed.  1 cm inferior, 1 cm deep, 1 cm lateral to the inferior sacroiliac fissure, the piriformis muscle was highlighted under contrast with muscle striations visible. 4 cc solution consisting of 3 cc of 0.2% ropivacaine, 1 cc of Decadron 10 mg/cc.  4 cc injected into  right piriformis muscle after contrast confirmation of muscle striations.  No radiating leg pain during injection.  Imaging Guidance (Non-Spinal):          Type of Imaging Technique: Fluoroscopy Guidance (Non-Spinal) Indication(s): Assistance in needle guidance and placement for procedures requiring needle placement in or near specific anatomical locations not easily accessible without such assistance. Exposure Time: Please see nurses notes. Contrast: Before injecting any contrast, we confirmed that the patient did not have an allergy to iodine, shellfish, or radiological contrast. Once satisfactory needle placement was completed at the desired level, radiological contrast was injected. Contrast injected under live fluoroscopy. No contrast complications. See chart for type and volume of contrast used. Fluoroscopic Guidance: I was personally present during the use of fluoroscopy. "Tunnel Vision Technique" used to obtain the best possible view of the target area. Parallax error corrected before commencing the procedure. "Direction-depth-direction" technique used to introduce the needle under continuous pulsed fluoroscopy. Once target was reached, antero-posterior, oblique, and lateral fluoroscopic projection used confirm needle placement in all planes. Images permanently stored in EMR. Interpretation: I personally interpreted the imaging intraoperatively. Adequate needle placement confirmed in multiple planes. Appropriate spread of contrast into desired area was observed. No evidence of afferent or efferent intravascular uptake. Permanent images saved  into the patient's record.   Post-operative Assessment:  Post-procedure Vital Signs:  Pulse/HCG Rate: 72 (nsr)  Temp: (!) 97.3 F (36.3 C) Resp: 15 BP: (!) 154/93 SpO2: 96 %  EBL: None  Complications: No immediate post-treatment complications observed by team, or reported by patient.  Note: The patient tolerated the entire procedure well. A repeat set of vitals were taken after the procedure and the patient was kept under observation following institutional policy, for this type of procedure. Post-procedural neurological assessment was performed, showing return to baseline, prior to discharge.  The patient was provided with post-procedure discharge instructions, including a section on how to identify potential problems. Should any problems arise concerning this procedure, the patient was given instructions to immediately contact us, at any time, without hesitation. In any case, we plan to contact the patient by telephone for a follow-up status report regarding this interventional procedure.  Comments:  No additional relevant information.  Plan of Care  Orders:  Orders Placed This Encounter  Procedures   DG PAIN CLINIC C-ARM 1-60 MIN NO REPORT    Intraoperative interpretation by procedural physician at [place].    Standing Status:   Standing    Number of Occurrences:   1    Order Specific Question:   Reason for exam:    Answer:   Assistance in needle guidance and placement for procedures requiring needle placement in or near specific anatomical locations not easily accessible without such assistance.   Chronic Opioid Analgesic:  Tramadol 100 mg twice daily, quantity 120/month MME equals 20   Medications ordered for procedure: Meds ordered this encounter  Medications   iohexol (OMNIPAQUE) 180 MG/ML injection 10 mL    Must be Myelogram-compatible. If not available, you may substitute with a water-soluble, non-ionic, hypoallergenic, myelogram-compatible radiological  contrast medium.   lidocaine (XYLOCAINE) 2 % (with pres) injection 400 mg   diazepam (VALIUM) tablet 5 mg    Make sure Flumazenil is available in the pyxis when using this medication. If oversedation occurs, administer 0.2 mg IV over 15 sec. If after 45 sec no response, administer 0.2 mg again over 1 min; may repeat at 1 min intervals; not to exceed 4 doses (1 mg)   dexamethasone (DECADRON) injection 10 mg   ropivacaine (PF) 2 mg/mL (0.2%) (NAROPIN) injection 9 mL   ropivacaine (PF) 2 mg/mL (0.2%) (NAROPIN) injection 9 mL   methylPREDNISolone acetate (DEPO-MEDROL) injection 80 mg   Medications administered: We administered iohexol, lidocaine, diazepam, dexamethasone, ropivacaine (PF) 2 mg/mL (0.2%), and ropivacaine (PF) 2 mg/mL (0.2%).  See the medical record for exact dosing, route, and time of administration.  Follow-up plan:   Return in about 4 weeks (around 06/04/2021) for Post Procedure Evaluation, virtual.     Recent Visits Date Type Provider Dept  04/24/21 Office Visit [name], MD [facility]  03/19/21 Procedure visit [name], MD [facility]  02/27/21 Telemedicine [name], MD [facility]  02/19/21 Telemedicine [name], MD [facility]  Showing recent visits within past 90 days and meeting all other requirements Today's Visits Date Type Provider Dept  05/07/21 Procedure visit [name], MD [facility]  Showing today's visits and meeting all other requirements Future Appointments Date Type Provider Dept  06/11/21 Appointment [name], MD [facility]  07/17/21 Appointment [name], MD [facility]  Showing future appointments within next 90 days and meeting all other requirements Disposition: Discharge home  Discharge (Date  Time): 05/07/2021;   hrs.   Primary Care Physician: Kirk Ruths, MD Location: [facility] Note by: [name], MD Date: 05/07/2021; Time: 1:08 PM  Disclaimer:  Medicine is not an exact science. The only guarantee in medicine is that nothing is guaranteed. It is important to note that the decision to proceed with this intervention was based on the information collected from the patient. The Data and conclusions were drawn from the patient's questionnaire, the interview, and the physical examination. Because the information was provided in large part by  the patient, it cannot be guaranteed that it has not been purposely or unconsciously manipulated. Every effort has been made to obtain as much relevant data as possible for this evaluation. It is important to note that the conclusions that lead to this procedure are derived in large part from the available data. Always take into account that the treatment will also be dependent on availability of resources and existing treatment guidelines, considered by other Pain Management Practitioners as being common knowledge and practice, at the time of the intervention. For Medico-Legal purposes, it is also important to point out that variation in procedural techniques and pharmacological choices are the acceptable norm. The indications, contraindications, technique, and results of the above procedure should only be interpreted and judged by a Board-Certified Interventional Pain Specialist with extensive familiarity and expertise in the same exact procedure and technique.

## 2021-05-07 NOTE — Patient Instructions (Signed)

## 2021-05-07 NOTE — Progress Notes (Signed)
Safety precautions to be maintained throughout the outpatient stay will include: orient to surroundings, keep bed in low position, maintain call bell within reach at all times, provide assistance with transfer out of bed and ambulation.  

## 2021-05-08 ENCOUNTER — Telehealth: Payer: Self-pay | Admitting: *Deleted

## 2021-05-08 NOTE — Telephone Encounter (Signed)
Called for post procedure check. No answer. LVM. 

## 2021-05-09 ENCOUNTER — Telehealth: Payer: Self-pay

## 2021-05-09 DIAGNOSIS — R1319 Other dysphagia: Secondary | ICD-10-CM | POA: Diagnosis not present

## 2021-05-09 DIAGNOSIS — E0500 Thyrotoxicosis with diffuse goiter without thyrotoxic crisis or storm: Secondary | ICD-10-CM | POA: Diagnosis not present

## 2021-05-09 DIAGNOSIS — E042 Nontoxic multinodular goiter: Secondary | ICD-10-CM | POA: Diagnosis not present

## 2021-05-09 NOTE — Telephone Encounter (Signed)
Pt is asking a nurse call in regards to her injection

## 2021-05-09 NOTE — Telephone Encounter (Signed)
Had SI 2 days ago. Patient having increased pain in the area. I informed her that this is to be expected, may take 4-10 days to see results.

## 2021-05-11 ENCOUNTER — Other Ambulatory Visit: Payer: Self-pay | Admitting: Internal Medicine

## 2021-05-11 DIAGNOSIS — R1319 Other dysphagia: Secondary | ICD-10-CM

## 2021-05-11 DIAGNOSIS — E042 Nontoxic multinodular goiter: Secondary | ICD-10-CM

## 2021-05-15 ENCOUNTER — Telehealth: Payer: Self-pay | Admitting: Student in an Organized Health Care Education/Training Program

## 2021-05-16 NOTE — Telephone Encounter (Signed)
Spoke with patient and she reports that she is in pain and stiff all day.  States that she lays in her bed and in her recliner and stretches all day.  States she uses some moist heat to try and loosen things up.  She still would like to have the robaxin more often during the day.  She then states that she would like her Gabapentin increased.  At this point I have told patient that she will need a VV to discuss medications management/changes.  Patient states she would like to hold off on that and would just like increase in Robaxin.  I told her I would relay the message.

## 2021-05-17 ENCOUNTER — Telehealth: Payer: Self-pay | Admitting: Student in an Organized Health Care Education/Training Program

## 2021-05-17 NOTE — Telephone Encounter (Signed)
Lori sent a message to you yesterday about this patient. Will you respond to Buelah Manis, or myself since [place] isn't here.  Patient has called again today.

## 2021-05-17 NOTE — Telephone Encounter (Signed)
Patient states she isn't receiving a  dose of her Robaxin because the facility does not dispense medications on the night shift.  I called [facility], spoke with Varney Biles, she told me that patient may receive the Robaxin every 8 hours, but because it is a prn order, she must ask for it. I called patient back and informed her of this.

## 2021-05-22 ENCOUNTER — Ambulatory Visit: Payer: Medicare HMO

## 2021-05-22 DIAGNOSIS — R6889 Other general symptoms and signs: Secondary | ICD-10-CM | POA: Diagnosis not present

## 2021-05-22 DIAGNOSIS — R399 Unspecified symptoms and signs involving the genitourinary system: Secondary | ICD-10-CM | POA: Diagnosis not present

## 2021-05-22 DIAGNOSIS — E7800 Pure hypercholesterolemia, unspecified: Secondary | ICD-10-CM | POA: Diagnosis not present

## 2021-05-22 DIAGNOSIS — R300 Dysuria: Secondary | ICD-10-CM | POA: Diagnosis not present

## 2021-05-22 DIAGNOSIS — E119 Type 2 diabetes mellitus without complications: Secondary | ICD-10-CM | POA: Diagnosis not present

## 2021-05-22 DIAGNOSIS — R8290 Unspecified abnormal findings in urine: Secondary | ICD-10-CM | POA: Diagnosis not present

## 2021-05-22 DIAGNOSIS — L4050 Arthropathic psoriasis, unspecified: Secondary | ICD-10-CM | POA: Diagnosis not present

## 2021-05-22 DIAGNOSIS — I10 Essential (primary) hypertension: Secondary | ICD-10-CM | POA: Diagnosis not present

## 2021-05-29 ENCOUNTER — Ambulatory Visit
Admission: RE | Admit: 2021-05-29 | Discharge: 2021-05-29 | Disposition: A | Discharge status: Home / Self Care | Payer: Medicare HMO | Source: Ambulatory Visit | Attending: Internal Medicine | Admitting: Internal Medicine

## 2021-05-29 ENCOUNTER — Other Ambulatory Visit: Payer: Self-pay

## 2021-05-29 DIAGNOSIS — K224 Dyskinesia of esophagus: Secondary | ICD-10-CM | POA: Diagnosis not present

## 2021-05-29 DIAGNOSIS — I10 Essential (primary) hypertension: Secondary | ICD-10-CM | POA: Diagnosis not present

## 2021-05-29 DIAGNOSIS — K219 Gastro-esophageal reflux disease without esophagitis: Secondary | ICD-10-CM | POA: Diagnosis not present

## 2021-05-29 DIAGNOSIS — E042 Nontoxic multinodular goiter: Secondary | ICD-10-CM | POA: Diagnosis not present

## 2021-05-29 DIAGNOSIS — Z6835 Body mass index (BMI) 35.0-35.9, adult: Secondary | ICD-10-CM | POA: Diagnosis not present

## 2021-05-29 DIAGNOSIS — R1310 Dysphagia, unspecified: Secondary | ICD-10-CM | POA: Diagnosis not present

## 2021-05-29 DIAGNOSIS — R1319 Other dysphagia: Secondary | ICD-10-CM | POA: Insufficient documentation

## 2021-05-29 DIAGNOSIS — I700 Atherosclerosis of aorta: Secondary | ICD-10-CM | POA: Diagnosis not present

## 2021-05-29 DIAGNOSIS — D649 Anemia, unspecified: Secondary | ICD-10-CM | POA: Diagnosis not present

## 2021-05-29 DIAGNOSIS — Z23 Encounter for immunization: Secondary | ICD-10-CM | POA: Diagnosis not present

## 2021-05-29 DIAGNOSIS — E119 Type 2 diabetes mellitus without complications: Secondary | ICD-10-CM | POA: Diagnosis not present

## 2021-05-29 DIAGNOSIS — F325 Major depressive disorder, single episode, in full remission: Secondary | ICD-10-CM | POA: Diagnosis not present

## 2021-06-06 ENCOUNTER — Ambulatory Visit: Payer: Medicare HMO | Admitting: Dermatology

## 2021-06-11 ENCOUNTER — Ambulatory Visit
Payer: Medicare HMO | Attending: Student in an Organized Health Care Education/Training Program | Admitting: Student in an Organized Health Care Education/Training Program

## 2021-06-11 ENCOUNTER — Other Ambulatory Visit: Payer: Self-pay

## 2021-06-11 ENCOUNTER — Encounter: Payer: Self-pay | Admitting: Student in an Organized Health Care Education/Training Program

## 2021-06-11 DIAGNOSIS — M5416 Radiculopathy, lumbar region: Secondary | ICD-10-CM

## 2021-06-11 DIAGNOSIS — Z9889 Other specified postprocedural states: Secondary | ICD-10-CM

## 2021-06-11 DIAGNOSIS — M461 Sacroiliitis, not elsewhere classified: Secondary | ICD-10-CM

## 2021-06-11 DIAGNOSIS — G5703 Lesion of sciatic nerve, bilateral lower limbs: Secondary | ICD-10-CM | POA: Diagnosis not present

## 2021-06-11 DIAGNOSIS — M47818 Spondylosis without myelopathy or radiculopathy, sacral and sacrococcygeal region: Secondary | ICD-10-CM | POA: Diagnosis not present

## 2021-06-11 DIAGNOSIS — G894 Chronic pain syndrome: Secondary | ICD-10-CM | POA: Diagnosis not present

## 2021-06-11 NOTE — Progress Notes (Signed)
Patient: Christine Higgins  Service Category: E/M  Provider: [name], MD  DOB: July 23, 1956  DOS: 06/11/2021  Location: Office  MRN: [id number]  Setting: Ambulatory outpatient  Referring Provider: Kirk Ruths, MD  Type: Established Patient  Specialty: Interventional Pain Management  PCP: Kirk Ruths, MD  Location: Home  Delivery: TeleHealth     Virtual Encounter - Pain Management PROVIDER NOTE: Information contained herein reflects review and annotations entered in association with encounter. Interpretation of such information and data should be left to medically-trained personnel. Information provided to patient can be located elsewhere in the medical record under "Patient Instructions". Document created using STT-dictation technology, any transcriptional errors that may result from process are unintentional.    Contact & Pharmacy Preferred: [phone] Home: [phone] (home) Mobile: [phone] (mobile) E-mail: [email or web address] [address] [place] [address] Phone: [phone] Fax: [phone]   Pre-screening  Ms. Spagna offered "in-person" vs "virtual" encounter. She indicated preferring virtual for this encounter.   Reason COVID-19*  Social distancing based on CDC and AMA recommendations.   I contacted Christine Higgins on 06/11/2021 via telephone.      I clearly identified myself as [name], MD. I verified that I was speaking with the correct person using two identifiers (Name: Christine Higgins, and date of birth: 1955/12/05).  Consent I sought verbal advanced consent from Christine Higgins for virtual visit interactions. I informed Ms. Asebedo of possible security and privacy concerns, risks, and limitations associated with providing "not-in-person" medical evaluation and management services. I also informed Ms. Pore of the availability of "in-person" appointments. Finally, I informed her that  there would be a charge for the virtual visit and that she could be  personally, fully or partially, financially responsible for it. Ms. Woldt expressed understanding and agreed to proceed.   Historic Elements   [name] is a 65 y.o. year old, female patient evaluated today after our last contact on 05/17/2021. [name]  has a past medical history of Anxiety, Asthma, Breast mass, Cervical disc disease, Chronic pain syndrome, COPD (chronic obstructive pulmonary disease) ([place]), Degenerative disc disease, lumbar, Depression, Fibromyalgia, Fibromyalgia, GERD (gastroesophageal reflux disease), Glaucoma, Graves disease, Headache, Hemorrhoids, Hyperlipidemia, Hypertension, Hyperthyroidism, Lumbar disc disease, Obesity, Pre-diabetes, Psoriatic arthritis ([place]), and Thyroid disease. She also  has a past surgical history that includes carpel tunn (Right); Hand surgery (Right); carpel tunnel (Left); Cesarean section; Shoulder surgery (Right); Back surgery; Neck surgery; Total hip arthroplasty (Right); Excision Morton's neuroma (Left, 02/05/2017); Colonoscopy; Joint replacement; Esophagogastroduodenoscopy (egd) with propofol (N/A, 06/26/2017); Knee Arthroplasty (Left, 08/11/2017); Knee arthroscopy (Right, 07/02/2018); Reverse shoulder arthroplasty (Left, 06/24/2019); Thoracic laminectomy for spinal cord stimulator (N/A, 08/07/2020); Colonoscopy with propofol (N/A, 10/20/2020); Cardiac catheterization; Tubal ligation; Knee Arthroplasty (Right, 02/05/2021); and Spinal cord stimulator removal (N/A, 04/09/2021). [name] has a current medication list which includes the following prescription(s): acetaminophen, vitamin c, atorvastatin, vitamin a, budesonide-formoterol, calcium carbonate, celecoxib, vitamin d3, ciclopirox, clobetasol ointment, clonazepam, coq-10, vitamin b-12, diphenhydramine, duloxetine, duloxetine, esomeprazole, fexofenadine, fluocinolone acetonide body, fluticasone, gabapentin, hydralazine, hydroxyzine,  linaclotide, losartan-hydrochlorothiazide, menthol (topical analgesic), methimazole, methocarbamol, multiple vitamin, probiotic product, propranolol, propylene glycol, sucralfate, tramadol, trazodone, turmeric, vitamin e, calcium-magnesium-vitamin d, celecoxib, clindamycin, clotrimazole-betamethasone, enoxaparin, fluconazole, and fish oil. She  reports that she quit smoking about 28 years ago. Her smoking use included cigarettes. She has never used smokeless tobacco. She reports that she does not currently use drugs. She  reports that she does not drink alcohol. [name] is allergic to cephalexin, ibuprofen, potassium chloride, shellfish allergy, and aspirin.   HPI  Today, she is being contacted for a post-procedure assessment.   Post-Procedure Evaluation  Procedure (05/07/2021):  Type: Diagnostic RIGHT Sacroiliac Joint Steroid Injection #1 and RIGHT piriformis trigger point injection #1 Region: Inferior Lumbosacral Region Level: PIIS (Posterior Inferior Iliac Spine) Laterality: Right  Anxiolysis: Please see nurses note.  Effectiveness during initial hour after procedure (Ultra-Short Term Relief): 100 %   Local anesthetic used: Long-acting (4-6 hours) Effectiveness: Defined as any analgesic benefit obtained secondary to the administration of local anesthetics. This carries significant diagnostic value as to the etiological location, or anatomical origin, of the pain. Duration of benefit is expected to coincide with the duration of the local anesthetic used.  Effectiveness during initial 4-6 hours after procedure (Short-Term Relief): 100 %   Long-term benefit: Defined as any relief past the pharmacologic duration of the local anesthetics.  Effectiveness past the initial 6 hours after procedure (Long-Term Relief): 85 % (patient reports pain was better for approx 2 weeks post procedure and then she fell and feels that she has had a set back.)   Benefits, current: Defined as benefit present at the  time of this evaluation.   Analgesia:  25-30%  Pharmacotherapy Assessment   Analgesic: Tramadol 100 mg twice daily, quantity 120/month MME equals 20   Monitoring: [place] PMP: PDMP reviewed during this encounter.       Pharmacotherapy: No side-effects or adverse reactions reported. Compliance: No problems identified. Effectiveness: Clinically acceptable. Plan: Refer to "POC". UDS:  Summary  Date Value Ref Range Status  09/29/2019 Note  Final    Comment:    ==================================================================== Compliance Drug Analysis, Ur ==================================================================== Test                             Result       Flag       Units Drug Present and Declared for Prescription Verification   7-aminoclonazepam              65           EXPECTED   ng/mg creat    7-aminoclonazepam is an expected metabolite of clonazepam. Source of    clonazepam is a scheduled prescription medication.   Tramadol                       >4310        EXPECTED   ng/mg creat   O-Desmethyltramadol            >4310        EXPECTED   ng/mg creat   N-Desmethyltramadol            3447         EXPECTED   ng/mg creat    Source of tramadol is a prescription medication. O-desmethyltramadol    and N-desmethyltramadol are expected metabolites of tramadol.   Gabapentin                     PRESENT      EXPECTED   Cyclobenzaprine                PRESENT      EXPECTED   Desmethylcyclobenzaprine       PRESENT      EXPECTED    Desmethylcyclobenzaprine is an expected metabolite of  cyclobenzaprine.   Duloxetine                     PRESENT      EXPECTED   Trazodone                      PRESENT      EXPECTED   1,3 chlorophenyl piperazine    PRESENT      EXPECTED    1,3-chlorophenyl piperazine is an expected metabolite of trazodone.   Diphenhydramine                PRESENT      EXPECTED   Propranolol                    PRESENT      EXPECTED Drug Absent but Declared for  Prescription Verification   Oxycodone                      Not Detected UNEXPECTED ng/mg creat   Acetaminophen                  Not Detected UNEXPECTED    Acetaminophen, as indicated in the declared medication list, is not    always detected even when used as directed.   Hydroxyzine                    Not Detected UNEXPECTED ==================================================================== Test                      Result    Flag   Units      Ref Range   Creatinine              116              mg/dL      >=20 ==================================================================== Declared Medications:  The flagging and interpretation on this report are based on the  following declared medications.  Unexpected results may arise from  inaccuracies in the declared medications.  **Note: The testing scope of this panel includes these medications:  Clonazepam  Cyclobenzaprine (Flexeril)  Diphenhydramine (Benadryl)  Duloxetine (Cymbalta)  Gabapentin (Neurontin)  Hydroxyzine  Oxycodone (Roxicodone)  Propranolol (Inderal)  Tramadol (Ultram)  Trazodone (Desyrel)  **Note: The testing scope of this panel does not include small to  moderate amounts of these reported medications:  Acetaminophen  **Note: The testing scope of this panel does not include the  following reported medications:  Apixaban  Atorvastatin (Lipitor)  Betamethasone (Lotrisone)  Budesonide (Symbicort)  Calcium  Ciprofloxacin (Cipro)  Clobetasol  Clotrimazole (Lotrisone)  Esomeprazole (Nexium)  Eye Drops  Formoterol (Symbicort)  Hydralazine (Apresoline)  Hydrochlorothiazide (Hyzaar)  Iron  Losartan (Hyzaar)  Menthol  Multivitamin  Probiotic  Sodium Chloride  Sucralfate (Carafate)  Turmeric  Ubiquinone (CoQ10)  Vitamin B12  Vitamin C  Vitamin D3  Vitamin E ==================================================================== For clinical consultation, please call [phone]. ====================================================================      Laboratory Chemistry Profile   Renal Lab Results  Component Value Date   BUN 7 (L) 04/04/2021   CREATININE 0.77 04/04/2021   BCR 11 (L) 04/27/2020   GFRAA 83 04/27/2020   GFRNONAA >60 04/04/2021    Hepatic Lab Results  Component Value Date   AST 21 01/25/2021   ALT 20 01/25/2021   ALBUMIN 4.3 01/25/2021   ALKPHOS 81 01/25/2021   LIPASE 27 12/14/2016    Electrolytes Lab Results  Component  Value Date   NA 139 04/04/2021   K 4.3 04/04/2021   CL 102 04/04/2021   CALCIUM 9.7 04/04/2021   MG 1.9 08/27/2014    Bone No results found for: VD25OH, [id number], VH8469GE9, BM8413KG4, 25OHVITD1, 25OHVITD2, 25OHVITD3, TESTOFREE, TESTOSTERONE  Inflammation (CRP: Acute Phase) (ESR: Chronic Phase) Lab Results  Component Value Date   CRP 0.5 01/25/2021   ESRSEDRATE 6 01/25/2021         Note: Above Lab results reviewed.  Imaging  DG ESOPHAGUS W SINGLE CM (SOL OR THIN BA) CLINICAL DATA:  Multiple thyroid nodules, dysphagia with liquids  EXAM: ESOPHOGRAM / BARIUM SWALLOW / BARIUM TABLET STUDY  TECHNIQUE: Combined double contrast and single contrast examination performed using effervescent crystals, thick barium liquid, and thin barium liquid. The patient was observed with fluoroscopy swallowing a 13 mm barium sulphate tablet.  FLUOROSCOPY TIME:  Fluoroscopy Time:  1 minute 54 seconds  Radiation Exposure Index (if provided by the fluoroscopic device): 88.8 mGy  Number of Acquired Spot Images: 2 images  COMPARISON:  Chest CT 02/15/2021.  FINDINGS: On cervical esophagram, the patient is noted to have and ACDF at C5-C6, without any significant mass effect. There is mild laryngeal penetration without intra tracheal aspiration. There is no evidence of extrinsic mass effect at the expected level of the thyroid gland.  There is mild to moderate esophageal dysmotility. There is no evidence  of esophageal stricture or visible ulceration.  No hiatal hernia. Spontaneous gastroesophageal reflux occurred to the midesophagus.  A 13 mm barium tablet was administered and passed into the stomach without difficulty.  IMPRESSION: Mild to moderate esophageal dysmotility. Spontaneous gastroesophageal reflux occurred to the midesophagus. No hiatal hernia.  No evidence of esophageal stricture. No significant extrinsic mass effect at the expected level of the thyroid.  Electronically Signed   By: Maurine Simmering M.D.   On: 05/29/2021 12:37  Assessment  The primary encounter diagnosis was Chronic pain syndrome. Diagnoses of Arthritis of sacroiliac joint of both sides, Piriformis syndrome of both sides, Lumbar radiculopathy, and History of lumbar laminectomy for spinal cord decompression were also pertinent to this visit.  Plan of Care    Patient was responding very well to her right sacroiliac joint and right piriformis injection.  She was endorsing 85% pain relief with improvement in range of motion and ability to ambulate.  Unfortunately she sustained a fall that caused a setback and now she has having increased bilateral SI joint and piriformis pain.  She states that the pain relief that she was experiencing after injection has pretty much returned back to baseline after her fall.  We discussed repeating bilateral SI joint and piriformis injection.  Risks and benefits reviewed and patient like to proceed.  We will plan on doing this with p.o. Valium.  1. Chronic pain syndrome - TRIGGER POINT INJECTION; Future - SACROILIAC JOINT INJECTION; Future  2. 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 Piriformis syndrome of both sides - TRIGGER POINT INJECTION; Future  4. Lumbar radiculopathy  5. History of lumbar laminectomy for spinal cord decompression -SCS explant -Consider repeat caudal   Orders:  Orders  Placed This Encounter  Procedures   TRIGGER POINT INJECTION    Area: Buttocks region (gluteal area) Indications: Piriformis muscle pain;  bilateral piriformis-syndrome; piriformis muscle spasms (W10.272). CPT code: 20552    Standing Status:   Future    Standing Expiration Date:   06/11/2022  Scheduling Instructions:     Bilateral with PO Valium    Order Specific Question:   Where will this procedure be performed?    Answer:   [facility]   SACROILIAC JOINT INJECTION    Standing Status:   Future    Standing Expiration Date:   07/12/2021    Scheduling Instructions:     B/L with Po Valium    Order Specific Question:   Where will this procedure be performed?    Answer:   [facility]    Follow-up plan:   Return in about 2 days (around 06/13/2021) for B/L SIJ+ Piriformis, minimal sedation (PO Valium).     Status post right L5-S1 ESI 03/23/2019, 6 out of 8 cc injected, #2 on 04/21/19, 5 cc more concentrated, #3 on 06/14/2019: 6 cc, #4 09/29/19 left L5/S1 IL , #5 12/08/19, #6 02/23/20. [employer] SCS trial 06/26/20 significant functional and analgesic benefit.  Patient ambulated during clinic which she has never done before.  Plan for implant with Dr Lacinda Axon            Recent Visits Date Type Provider Dept  05/07/21 Procedure visit [name], MD [facility]  04/24/21 Office Visit [name], MD [facility]  03/19/21 Procedure visit [name], MD [facility]  Showing recent visits within past 90 days and meeting all other requirements Today's Visits Date Type Provider Dept  06/11/21 Office Visit [name], MD [facility]  Showing today's visits and meeting all other requirements Future Appointments Date Type Provider Dept  07/17/21 Appointment [name], MD [facility]  Showing future appointments within next 90 days and meeting all other requirements I discussed the assessment and treatment plan with  the patient. The patient was provided an opportunity to ask questions and all were answered. The patient agreed with the plan and demonstrated an understanding of the instructions.  Patient advised to call back or seek an in-person evaluation if the symptoms or condition worsens.  Duration of encounter: 49minutes.  Note by: [name], MD Date: 06/11/2021; Time: 11:09 AM

## 2021-06-14 DIAGNOSIS — Z01419 Encounter for gynecological examination (general) (routine) without abnormal findings: Secondary | ICD-10-CM | POA: Diagnosis not present

## 2021-06-14 DIAGNOSIS — N952 Postmenopausal atrophic vaginitis: Secondary | ICD-10-CM | POA: Diagnosis not present

## 2021-06-14 DIAGNOSIS — N898 Other specified noninflammatory disorders of vagina: Secondary | ICD-10-CM | POA: Diagnosis not present

## 2021-06-14 DIAGNOSIS — G5602 Carpal tunnel syndrome, left upper limb: Secondary | ICD-10-CM | POA: Diagnosis not present

## 2021-06-14 DIAGNOSIS — B354 Tinea corporis: Secondary | ICD-10-CM | POA: Diagnosis not present

## 2021-06-14 DIAGNOSIS — B9689 Other specified bacterial agents as the cause of diseases classified elsewhere: Secondary | ICD-10-CM | POA: Diagnosis not present

## 2021-06-20 ENCOUNTER — Ambulatory Visit (INDEPENDENT_AMBULATORY_CARE_PROVIDER_SITE_OTHER): Payer: Self-pay | Admitting: Licensed Clinical Social Worker

## 2021-06-20 DIAGNOSIS — Z91199 Patient's noncompliance with other medical treatment and regimen due to unspecified reason: Secondary | ICD-10-CM

## 2021-06-20 DIAGNOSIS — G5603 Carpal tunnel syndrome, bilateral upper limbs: Secondary | ICD-10-CM | POA: Insufficient documentation

## 2021-06-20 NOTE — Progress Notes (Signed)
Pt had 2pm appointment in office. Pt called to cancel appt at 1:30pm. Visit will be coded as no show.

## 2021-06-21 DIAGNOSIS — E0500 Thyrotoxicosis with diffuse goiter without thyrotoxic crisis or storm: Secondary | ICD-10-CM | POA: Diagnosis not present

## 2021-06-25 ENCOUNTER — Ambulatory Visit
Admission: RE | Admit: 2021-06-25 | Discharge: 2021-06-25 | Disposition: A | Discharge status: Home / Self Care | Payer: Medicare HMO | Source: Ambulatory Visit | Attending: Student in an Organized Health Care Education/Training Program | Admitting: Student in an Organized Health Care Education/Training Program

## 2021-06-25 ENCOUNTER — Encounter: Payer: Self-pay | Admitting: Student in an Organized Health Care Education/Training Program

## 2021-06-25 ENCOUNTER — Other Ambulatory Visit: Payer: Self-pay

## 2021-06-25 ENCOUNTER — Ambulatory Visit (HOSPITAL_BASED_OUTPATIENT_CLINIC_OR_DEPARTMENT_OTHER): Payer: Medicare HMO | Admitting: Student in an Organized Health Care Education/Training Program

## 2021-06-25 DIAGNOSIS — Z79891 Long term (current) use of opiate analgesic: Secondary | ICD-10-CM | POA: Diagnosis not present

## 2021-06-25 DIAGNOSIS — G5703 Lesion of sciatic nerve, bilateral lower limbs: Secondary | ICD-10-CM | POA: Diagnosis not present

## 2021-06-25 DIAGNOSIS — M47818 Spondylosis without myelopathy or radiculopathy, sacral and sacrococcygeal region: Secondary | ICD-10-CM

## 2021-06-25 DIAGNOSIS — Z96612 Presence of left artificial shoulder joint: Secondary | ICD-10-CM | POA: Insufficient documentation

## 2021-06-25 DIAGNOSIS — Z886 Allergy status to analgesic agent status: Secondary | ICD-10-CM | POA: Insufficient documentation

## 2021-06-25 DIAGNOSIS — G894 Chronic pain syndrome: Secondary | ICD-10-CM

## 2021-06-25 DIAGNOSIS — Z96641 Presence of right artificial hip joint: Secondary | ICD-10-CM | POA: Insufficient documentation

## 2021-06-25 DIAGNOSIS — Z96653 Presence of artificial knee joint, bilateral: Secondary | ICD-10-CM | POA: Diagnosis not present

## 2021-06-25 DIAGNOSIS — G5700 Lesion of sciatic nerve, unspecified lower limb: Secondary | ICD-10-CM | POA: Diagnosis not present

## 2021-06-25 DIAGNOSIS — Z881 Allergy status to other antibiotic agents status: Secondary | ICD-10-CM | POA: Insufficient documentation

## 2021-06-25 DIAGNOSIS — M461 Sacroiliitis, not elsewhere classified: Secondary | ICD-10-CM | POA: Diagnosis not present

## 2021-06-25 DIAGNOSIS — Z79899 Other long term (current) drug therapy: Secondary | ICD-10-CM | POA: Diagnosis not present

## 2021-06-25 MED ORDER — ROPIVACAINE HCL 2 MG/ML IJ SOLN
9.0000 mL | Freq: Once | INTRAMUSCULAR | Status: AC
  Administered 2021-06-25: 9 mL via PERINEURAL

## 2021-06-25 MED ORDER — DIAZEPAM 5 MG PO TABS
ORAL_TABLET | ORAL | Status: AC
  Filled 2021-06-25: qty 1

## 2021-06-25 MED ORDER — DEXAMETHASONE SODIUM PHOSPHATE 10 MG/ML IJ SOLN
INTRAMUSCULAR | Status: AC
  Filled 2021-06-25: qty 1

## 2021-06-25 MED ORDER — LIDOCAINE HCL 2 % IJ SOLN
20.0000 mL | Freq: Once | INTRAMUSCULAR | Status: AC
  Administered 2021-06-25: 400 mg

## 2021-06-25 MED ORDER — IOHEXOL 180 MG/ML  SOLN
10.0000 mL | Freq: Once | INTRAMUSCULAR | Status: AC
  Administered 2021-06-25: 10 mL via INTRA_ARTICULAR

## 2021-06-25 MED ORDER — DEXAMETHASONE SODIUM PHOSPHATE 10 MG/ML IJ SOLN
10.0000 mg | Freq: Once | INTRAMUSCULAR | Status: AC
  Administered 2021-06-25: 10 mg

## 2021-06-25 MED ORDER — ROPIVACAINE HCL 2 MG/ML IJ SOLN
INTRAMUSCULAR | Status: AC
  Filled 2021-06-25: qty 20

## 2021-06-25 MED ORDER — LIDOCAINE HCL 2 % IJ SOLN
INTRAMUSCULAR | Status: AC
  Filled 2021-06-25: qty 20

## 2021-06-25 MED ORDER — DIAZEPAM 5 MG PO TABS
5.0000 mg | ORAL_TABLET | ORAL | Status: AC
  Administered 2021-06-25: 5 mg via ORAL

## 2021-06-25 MED ORDER — METHYLPREDNISOLONE ACETATE 80 MG/ML IJ SUSP
INTRAMUSCULAR | Status: AC
  Filled 2021-06-25: qty 1

## 2021-06-25 MED ORDER — METHYLPREDNISOLONE ACETATE 80 MG/ML IJ SUSP
80.0000 mg | Freq: Once | INTRAMUSCULAR | Status: AC
  Administered 2021-06-25: 80 mg via INTRA_ARTICULAR

## 2021-06-25 NOTE — Progress Notes (Signed)
PROVIDER NOTE: Information contained herein reflects review and annotations entered in association with encounter. Interpretation of such information and data should be left to medically-trained personnel. Information provided to patient can be located elsewhere in the medical record under "Patient Instructions". Document created using STT-dictation technology, any transcriptional errors that may result from process are unintentional.    Patient: Christine Higgins  Service Category: Procedure  Provider: [name], MD  DOB: April 28, 1956  DOS: 06/25/2021  Location: [facility]  MRN: [id number]  Setting: Ambulatory - outpatient  Referring Provider: [name], MD  Type: Established Patient  Specialty: Interventional Pain Management  PCP: Kirk Ruths, MD   Primary Reason for Visit: Interventional Pain Management Treatment. CC: SI joint and buttock pain    Procedure:          Anesthesia, Analgesia, Anxiolysis:  Type: Therapeutic BILATERAL Sacroiliac Joint Steroid Injection #2 and Bilateral piriformis trigger point injection #2 Region: Inferior Lumbosacral Region Level: PIIS (Posterior Inferior Iliac Spine) Laterality: Bilateral  Type: Local Anesthesia with p.o. Valium 5 mg Local Anesthetic: Lidocaine 1-2% Sedation: Minimal Anxiolysis  Indication(s): Anxiety & Analgesia Route: Infiltration (Sanger/IM) IV Access: Available   Position: Prone           Indications: 1. Chronic pain syndrome   2. Arthritis of sacroiliac joint of both sides   3. Piriformis syndrome of both sides    Pain Score: Pre-procedure: 10-Worst pain ever/10 Post-procedure: 0-No pain/10     Pre-op H&P Assessment:  Christine Higgins is a 65 y.o. (year old), female patient, seen today for interventional treatment. She  has a past surgical history that includes carpel tunn (Right); Hand surgery (Right); carpel tunnel (Left); Cesarean section; Shoulder surgery (Right); Back surgery; Neck surgery; Total hip  arthroplasty (Right); Excision Morton's neuroma (Left, 02/05/2017); Colonoscopy; Joint replacement; Esophagogastroduodenoscopy (egd) with propofol (N/A, 06/26/2017); Knee Arthroplasty (Left, 08/11/2017); Knee arthroscopy (Right, 07/02/2018); Reverse shoulder arthroplasty (Left, 06/24/2019); Thoracic laminectomy for spinal cord stimulator (N/A, 08/07/2020); Colonoscopy with propofol (N/A, 10/20/2020); Cardiac catheterization; Tubal ligation; Knee Arthroplasty (Right, 02/05/2021); and Spinal cord stimulator removal (N/A, 04/09/2021). Christine Higgins has a current medication list which includes the following prescription(s): acetaminophen, vitamin c, atorvastatin, vitamin a, budesonide-formoterol, calcium carbonate, celecoxib, vitamin d3, ciclopirox, clobetasol ointment, clonazepam, coq-10, vitamin b-12, diphenhydramine, duloxetine, duloxetine, esomeprazole, fexofenadine, fluocinolone acetonide body, fluticasone, gabapentin, hydralazine, hydroxyzine, linaclotide, losartan-hydrochlorothiazide, menthol (topical analgesic), methimazole, methocarbamol, multiple vitamin, probiotic product, propranolol, propylene glycol, sucralfate, tramadol, trazodone, turmeric, vitamin e, calcium-magnesium-vitamin d, celecoxib, clindamycin, clotrimazole-betamethasone, enoxaparin, fluconazole, fish oil, and tolnaftate. Her primarily concern today is the No chief complaint on file.  Initial Vital Signs:  Pulse/HCG Rate: 77  Temp: (!) 97.1 F (36.2 C) Resp: 16 BP: 120/76 SpO2: 99 %  BMI: Estimated body mass index is 36.68 kg/m as calculated from the following:   Height as of this encounter: 5\' 11"  (1.803 m).   Weight as of this encounter: 263 lb (119.3 kg).  Risk Assessment: Allergies: Reviewed. She is allergic to cephalexin, ibuprofen, potassium chloride, shellfish allergy, and aspirin.  Allergy Precautions: None required Coagulopathies: Reviewed. None identified.  Blood-thinner therapy: None at this time Active Infection(s):  Reviewed. None identified. Christine Higgins is afebrile  Site Confirmation: Ms. Cara was asked to confirm the procedure and laterality before marking the site Procedure checklist: Completed Consent: Before the procedure and under the influence of no sedative(s), amnesic(s), or anxiolytics, the patient was informed of the treatment options, risks and possible complications. To fulfill our ethical and legal obligations, as recommended  by the American Medical Association's Code of Ethics, I have informed the patient of my clinical impression; the nature and purpose of the treatment or procedure; the risks, benefits, and possible complications of the intervention; the alternatives, including doing nothing; the risk(s) and benefit(s) of the alternative treatment(s) or procedure(s); and the risk(s) and benefit(s) of doing nothing. The patient was provided information about the general risks and possible complications associated with the procedure. These may include, but are not limited to: failure to achieve desired goals, infection, bleeding, organ or nerve damage, allergic reactions, paralysis, and death. In addition, the patient was informed of those risks and complications associated to the procedure, such as failure to decrease pain; infection; bleeding; organ or nerve damage with subsequent damage to sensory, motor, and/or autonomic systems, resulting in permanent pain, numbness, and/or weakness of one or several areas of the body; allergic reactions; (i.e.: anaphylactic reaction); and/or death. Furthermore, the patient was informed of those risks and complications associated with the medications. These include, but are not limited to: allergic reactions (i.e.: anaphylactic or anaphylactoid reaction(s)); adrenal axis suppression; blood sugar elevation that in diabetics may result in ketoacidosis or comma; water retention that in patients with history of congestive heart failure may result in shortness of breath,  pulmonary edema, and decompensation with resultant heart failure; weight gain; swelling or edema; medication-induced neural toxicity; particulate matter embolism and blood vessel occlusion with resultant organ, and/or nervous system infarction; and/or aseptic necrosis of one or more joints. Finally, the patient was informed that Medicine is not an exact science; therefore, there is also the possibility of unforeseen or unpredictable risks and/or possible complications that may result in a catastrophic outcome. The patient indicated having understood very clearly. We have given the patient no guarantees and we have made no promises. Enough time was given to the patient to ask questions, all of which were answered to the patient's satisfaction. Ms. Manfredi has indicated that she wanted to continue with the procedure. Attestation: I, the ordering provider, attest that I have discussed with the patient the benefits, risks, side-effects, alternatives, likelihood of achieving goals, and potential problems during recovery for the procedure that I have provided informed consent. Date  Time: 06/25/2021 11:13 AM  Pre-Procedure Preparation:  Monitoring: As per clinic protocol. Respiration, ETCO2, SpO2, BP, heart rate and rhythm monitor placed and checked for adequate function Safety Precautions: Patient was assessed for positional comfort and pressure points before starting the procedure. Time-out: I initiated and conducted the "Time-out" before starting the procedure, as per protocol. The patient was asked to participate by confirming the accuracy of the "Time Out" information. Verification of the correct person, site, and procedure were performed and confirmed by me, the nursing staff, and the patient. "Time-out" conducted as per Joint Commission's Universal Protocol (UP.01.01.01). Time: 1143  Description of Procedure:          Target Area: Inferior, posterior, aspect of the sacroiliac fissure Approach:  Posterior, paraspinal, ipsilateral approach. Area Prepped: Entire Lower Lumbosacral Region DuraPrep (Iodine Povacrylex [0.7% available iodine] and Isopropyl Alcohol, 74% w/w) Safety Precautions: Aspiration looking for blood return was conducted prior to all injections. At no point did we inject any substances, as a needle was being advanced. No attempts were made at seeking any paresthesias. Safe injection practices and needle disposal techniques used. Medications properly checked for expiration dates. SDV (single dose vial) medications used. Description of the Procedure: Protocol guidelines were followed. The patient was placed in position over the procedure table. The target  area was identified and the area prepped in the usual manner. Skin & deeper tissues infiltrated with local anesthetic. Appropriate amount of time allowed to pass for local anesthetics to take effect. The procedure needle was advanced under fluoroscopic guidance into the sacroiliac joint until a firm endpoint was obtained. Proper needle placement secured. Negative aspiration confirmed. Solution injected in intermittent fashion, asking for systemic symptoms every 0.5cc of injectate. The needles were then removed and the area cleansed, making sure to leave some of the prepping solution back to take advantage of its long term bactericidal properties. Vitals:   06/25/21 1135 06/25/21 1140 06/25/21 1145 06/25/21 1150  BP: 131/73 118/82 121/80 123/84  Pulse: 77 75 77 76  Resp: 15 16 13 15   Temp:      TempSrc:      SpO2: 99% 100% 99% 99%  Weight:      Height:         Start Time: 1143 hrs. End Time: 1149 hrs. Materials:  Needle(s) Type: Spinal Needle Gauge: 22G Length: 3.5-in Medication(s): Please see orders for medications and dosing details. 5cc solution made of 4.5 cc of 0.2% ropivacaine, 0.5 cc of methylprednisolone, 80 mg/cc.  5 cc injected into right SI-J after contrast confirmation. 5cc solution made of 4.5 cc of 0.2%  ropivacaine, 0.5 cc of methylprednisolone, 80 mg/cc.  5 cc injected into left SI-J after contrast confirmation.  Afterwards RIGHT & LEFT  piriformis trigger point injections were also performed.  1 cm inferior, 1 cm deep, 1 cm lateral to the inferior sacroiliac fissure, the piriformis muscle was highlighted under contrast with muscle striations visible. 4 cc solution consisting of 3.5 cc of 0.2% ropivacaine, 0.5 cc of Decadron 10 mg/cc.  4 cc injected into  right piriformis muscle after contrast confirmation of muscle striations.  No radiating leg pain during injection. 4 cc solution consisting of 3.5 cc of 0.2% ropivacaine, 0.5 cc of Decadron 10 mg/cc.  4 cc injected into  left piriformis muscle after contrast confirmation of muscle striations.  No radiating leg pain during injection.  Imaging Guidance (Non-Spinal):          Type of Imaging Technique: Fluoroscopy Guidance (Non-Spinal) Indication(s): Assistance in needle guidance and placement for procedures requiring needle placement in or near specific anatomical locations not easily accessible without such assistance. Exposure Time: Please see nurses notes. Contrast: Before injecting any contrast, we confirmed that the patient did not have an allergy to iodine, shellfish, or radiological contrast. Once satisfactory needle placement was completed at the desired level, radiological contrast was injected. Contrast injected under live fluoroscopy. No contrast complications. See chart for type and volume of contrast used. Fluoroscopic Guidance: I was personally present during the use of fluoroscopy. "Tunnel Vision Technique" used to obtain the best possible view of the target area. Parallax error corrected before commencing the procedure. "Direction-depth-direction" technique used to introduce the needle under continuous pulsed fluoroscopy. Once target was reached, antero-posterior, oblique, and lateral fluoroscopic projection used confirm needle placement  in all planes. Images permanently stored in EMR. Interpretation: I personally interpreted the imaging intraoperatively. Adequate needle placement confirmed in multiple planes. Appropriate spread of contrast into desired area was observed. No evidence of afferent or efferent intravascular uptake. Permanent images saved into the patient's record.   Post-operative Assessment:  Post-procedure Vital Signs:  Pulse/HCG Rate: 76 (nsr)  Temp: (!) 97.1 F (36.2 C) Resp: 15 BP: 123/84 SpO2: 99 %  EBL: None  Complications: No immediate post-treatment complications observed by team, or  reported by patient.  Note: The patient tolerated the entire procedure well. A repeat set of vitals were taken after the procedure and the patient was kept under observation following institutional policy, for this type of procedure. Post-procedural neurological assessment was performed, showing return to baseline, prior to discharge. The patient was provided with post-procedure discharge instructions, including a section on how to identify potential problems. Should any problems arise concerning this procedure, the patient was given instructions to immediately contact us, at any time, without hesitation. In any case, we plan to contact the patient by telephone for a follow-up status report regarding this interventional procedure.  Comments:  No additional relevant information.  Plan of Care  Orders:  Orders Placed This Encounter  Procedures   DG PAIN [facility] C-ARM 1-60 MIN NO REPORT    Intraoperative interpretation by procedural physician at [place].    Standing Status:   Standing    Number of Occurrences:   1    Order Specific Question:   Reason for exam:    Answer:   Assistance in needle guidance and placement for procedures requiring needle placement in or near specific anatomical locations not easily accessible without such assistance.    Chronic Opioid Analgesic:  Tramadol 100 mg twice daily,  quantity 120/month MME equals 20   Medications ordered for procedure: Meds ordered this encounter  Medications   iohexol (OMNIPAQUE) 180 MG/ML injection 10 mL    Must be Myelogram-compatible. If not available, you may substitute with a water-soluble, non-ionic, hypoallergenic, myelogram-compatible radiological contrast medium.   lidocaine (XYLOCAINE) 2 % (with pres) injection 400 mg   diazepam (VALIUM) tablet 5 mg    Make sure Flumazenil is available in the pyxis when using this medication. If oversedation occurs, administer 0.2 mg IV over 15 sec. If after 45 sec no response, administer 0.2 mg again over 1 min; may repeat at 1 min intervals; not to exceed 4 doses (1 mg)   dexamethasone (DECADRON) injection 10 mg   ropivacaine (PF) 2 mg/mL (0.2%) (NAROPIN) injection 9 mL   ropivacaine (PF) 2 mg/mL (0.2%) (NAROPIN) injection 9 mL   methylPREDNISolone acetate (DEPO-MEDROL) injection 80 mg    Medications administered: We administered iohexol, lidocaine, diazepam, dexamethasone, ropivacaine (PF) 2 mg/mL (0.2%), ropivacaine (PF) 2 mg/mL (0.2%), and methylPREDNISolone acetate.  See the medical record for exact dosing, route, and time of administration.  Follow-up plan:   Return for Keep sch. appt.     Recent Visits Date Type Provider Dept  06/11/21 Office Visit [name], MD [facility]  05/07/21 Procedure visit [name], MD [facility]  04/24/21 Office Visit [name], MD [facility]  Showing recent visits within past 90 days and meeting all other requirements Today's Visits Date Type Provider Dept  06/25/21 Procedure visit [name], MD [facility]  Showing today's visits and meeting all other requirements Future Appointments Date Type Provider Dept  07/17/21 Appointment [name], MD [facility]  Showing future appointments within next 90 days and meeting all other requirements Disposition: Discharge home   Discharge (Date  Time): 06/25/2021; 1154 hrs.   Primary Care Physician: Kirk Ruths, MD Location: [facility] Note by: [name], MD Date: 06/25/2021; Time: 12:00 PM  Disclaimer:  Medicine is not an exact science. The only guarantee in medicine is that nothing is guaranteed. It is important to note that the decision to proceed with this intervention was based on the information collected  from the patient. The Data and conclusions were drawn from the patient's questionnaire, the interview, and the physical examination. Because the information was provided in large part by the patient, it cannot be guaranteed that it has not been purposely or unconsciously manipulated. Every effort has been made to obtain as much relevant data as possible for this evaluation. It is important to note that the conclusions that lead to this procedure are derived in large part from the available data. Always take into account that the treatment will also be dependent on availability of resources and existing treatment guidelines, considered by other Pain Management Practitioners as being common knowledge and practice, at the time of the intervention. For Medico-Legal purposes, it is also important to point out that variation in procedural techniques and pharmacological choices are the acceptable norm. The indications, contraindications, technique, and results of the above procedure should only be interpreted and judged by a Board-Certified Interventional Pain Specialist with extensive familiarity and expertise in the same exact procedure and technique.

## 2021-06-25 NOTE — Patient Instructions (Signed)

## 2021-06-25 NOTE — Progress Notes (Signed)
Safety precautions to be maintained throughout the outpatient stay will include: orient to surroundings, keep bed in low position, maintain call bell within reach at all times, provide assistance with transfer out of bed and ambulation.  

## 2021-06-26 ENCOUNTER — Telehealth: Payer: Self-pay | Admitting: *Deleted

## 2021-06-26 NOTE — Telephone Encounter (Signed)
No problems post procedure. 

## 2021-07-02 ENCOUNTER — Other Ambulatory Visit: Payer: Self-pay | Admitting: Obstetrics and Gynecology

## 2021-07-02 DIAGNOSIS — Z1231 Encounter for screening mammogram for malignant neoplasm of breast: Secondary | ICD-10-CM

## 2021-07-05 DIAGNOSIS — E0500 Thyrotoxicosis with diffuse goiter without thyrotoxic crisis or storm: Secondary | ICD-10-CM | POA: Diagnosis not present

## 2021-07-05 DIAGNOSIS — L4050 Arthropathic psoriasis, unspecified: Secondary | ICD-10-CM | POA: Diagnosis not present

## 2021-07-05 DIAGNOSIS — D649 Anemia, unspecified: Secondary | ICD-10-CM | POA: Diagnosis not present

## 2021-07-09 ENCOUNTER — Other Ambulatory Visit: Payer: Self-pay | Admitting: Obstetrics and Gynecology

## 2021-07-10 ENCOUNTER — Other Ambulatory Visit: Payer: Self-pay | Admitting: Obstetrics and Gynecology

## 2021-07-10 DIAGNOSIS — L723 Sebaceous cyst: Secondary | ICD-10-CM

## 2021-07-11 ENCOUNTER — Other Ambulatory Visit: Payer: Self-pay | Admitting: Obstetrics and Gynecology

## 2021-07-11 DIAGNOSIS — L723 Sebaceous cyst: Secondary | ICD-10-CM

## 2021-07-17 ENCOUNTER — Encounter: Payer: Self-pay | Admitting: Student in an Organized Health Care Education/Training Program

## 2021-07-17 ENCOUNTER — Other Ambulatory Visit: Payer: Self-pay

## 2021-07-17 ENCOUNTER — Ambulatory Visit
Admission: RE | Admit: 2021-07-17 | Discharge: 2021-07-17 | Disposition: A | Discharge status: Home / Self Care | Payer: Medicare HMO | Source: Ambulatory Visit | Attending: Student in an Organized Health Care Education/Training Program | Admitting: Student in an Organized Health Care Education/Training Program

## 2021-07-17 ENCOUNTER — Ambulatory Visit
Payer: Medicare HMO | Attending: Student in an Organized Health Care Education/Training Program | Admitting: Student in an Organized Health Care Education/Training Program

## 2021-07-17 ENCOUNTER — Ambulatory Visit
Admission: RE | Admit: 2021-07-17 | Discharge: 2021-07-17 | Disposition: A | Discharge status: Home / Self Care | Payer: Medicare HMO | Attending: Student in an Organized Health Care Education/Training Program | Admitting: Student in an Organized Health Care Education/Training Program

## 2021-07-17 VITALS — BP 141/88 | HR 85 | Temp 97.0°F | Resp 16 | Ht 71.0 in | Wt 260.0 lb

## 2021-07-17 DIAGNOSIS — M5137 Other intervertebral disc degeneration, lumbosacral region: Secondary | ICD-10-CM

## 2021-07-17 DIAGNOSIS — M47818 Spondylosis without myelopathy or radiculopathy, sacral and sacrococcygeal region: Secondary | ICD-10-CM

## 2021-07-17 DIAGNOSIS — L409 Psoriasis, unspecified: Secondary | ICD-10-CM | POA: Diagnosis not present

## 2021-07-17 DIAGNOSIS — G5703 Lesion of sciatic nerve, bilateral lower limbs: Secondary | ICD-10-CM

## 2021-07-17 DIAGNOSIS — M961 Postlaminectomy syndrome, not elsewhere classified: Secondary | ICD-10-CM

## 2021-07-17 DIAGNOSIS — Z7960 Long term (current) use of unspecified immunomodulators and immunosuppressants: Secondary | ICD-10-CM | POA: Diagnosis not present

## 2021-07-17 DIAGNOSIS — Z96641 Presence of right artificial hip joint: Secondary | ICD-10-CM | POA: Diagnosis not present

## 2021-07-17 DIAGNOSIS — M47816 Spondylosis without myelopathy or radiculopathy, lumbar region: Secondary | ICD-10-CM | POA: Diagnosis not present

## 2021-07-17 DIAGNOSIS — G894 Chronic pain syndrome: Secondary | ICD-10-CM

## 2021-07-17 DIAGNOSIS — M545 Low back pain, unspecified: Secondary | ICD-10-CM

## 2021-07-17 DIAGNOSIS — Z9889 Other specified postprocedural states: Secondary | ICD-10-CM

## 2021-07-17 DIAGNOSIS — L4050 Arthropathic psoriasis, unspecified: Secondary | ICD-10-CM | POA: Diagnosis not present

## 2021-07-17 DIAGNOSIS — M797 Fibromyalgia: Secondary | ICD-10-CM | POA: Diagnosis not present

## 2021-07-17 DIAGNOSIS — M5416 Radiculopathy, lumbar region: Secondary | ICD-10-CM | POA: Insufficient documentation

## 2021-07-17 DIAGNOSIS — M159 Polyosteoarthritis, unspecified: Secondary | ICD-10-CM | POA: Diagnosis not present

## 2021-07-17 MED ORDER — METHOCARBAMOL 750 MG PO TABS: 750.0000 mg | ORAL_TABLET | Freq: Three times a day (TID) | ORAL | 11 refills | Status: DC | PRN

## 2021-07-17 MED ORDER — TRAMADOL HCL 50 MG PO TABS: 100.0000 mg | ORAL_TABLET | Freq: Two times a day (BID) | ORAL | 2 refills | Status: AC

## 2021-07-17 NOTE — Progress Notes (Signed)
PROVIDER NOTE: Information contained herein reflects review and annotations entered in association with encounter. Interpretation of such information and data should be left to medically-trained personnel. Information provided to patient can be located elsewhere in the medical record under "Patient Instructions". Document created using STT-dictation technology, any transcriptional errors that may result from process are unintentional.    Patient: Christine Higgins  Service Category: E/M  Provider: [name], MD  DOB: 21-Oct-1955  DOS: 07/17/2021  Specialty: Interventional Pain Management  MRN: [id number]  Setting: Ambulatory outpatient  PCP: Kirk Ruths, MD  Type: Established Patient    Referring Provider: Kirk Ruths, MD  Location: Office  Delivery: Face-to-face     HPI  [name], a 65 y.o. year old female, is here today because of her Chronic pain syndrome [G89.4]. [name] primary complain today is Back Pain (Right lower back) Last encounter: My last encounter with her was on 06/25/2021. Pertinent problems: [name] has Failed back surgical syndrome; DDD (degenerative disc disease), lumbosacral; Severe obesity (BMI 35.0-35.9 with comorbidity) (Christine Higgins); Primary osteoarthritis of right knee; DM II (diabetes mellitus, type II), controlled (Christine Higgins); Chronic pain syndrome; Fibromyalgia; and Lumbar radiculopathy on their pertinent problem list. Pain Assessment: Severity of Chronic pain is reported as a 8 /10. Location: Back Lower/radiates down left leg. Onset: More than a month ago. Quality: Constant, Discomfort. Timing: Constant. Modifying factor(s): rest. Vitals:  height is $RemoveB'5\' 11"'EISKmlOy$  (1.803 m) and weight is 260 lb (117.9 kg). Her temperature is 97 F (36.1 C) (abnormal). Her blood pressure is 141/88 (abnormal) and her pulse is 85. Her respiration is 16 and oxygen saturation is 99%.   Reason for encounter: medication management.   No change in medical history since last visit.   Patient's pain is at baseline.  Patient continues multimodal pain regimen as prescribed.  States that it provides pain relief and improvement in functional status.    Post-Procedure Evaluation  Procedure (06/25/2021):  Procedure:          Anesthesia, Analgesia, Anxiolysis:  Type: Therapeutic BILATERAL Sacroiliac Joint Steroid Injection #2 and Bilateral piriformis trigger point injection #2 Region: Inferior Lumbosacral Region Level: PIIS (Posterior Inferior Iliac Spine) Laterality: Bilateral  Type: Local Anesthesia with p.o. Valium 5 mg Local Anesthetic: Lidocaine 1-2% Sedation: Minimal Anxiolysis  Indication(s): Anxiety & Analgesia Route: Infiltration ([place]/IM) IV Access: Available   Position: Prone           Indications: 1. Chronic pain syndrome   2. Arthritis of sacroiliac joint of both sides   3. Piriformis syndrome of both sides    Pain Score: Pre-procedure: 10-Worst pain ever/10 Post-procedure: 0-No pain/10      Anxiolysis: Please see nurses note.  Effectiveness during initial hour after procedure (Ultra-Short Term Relief): 75 %   Local anesthetic used: Long-acting (4-6 hours) Effectiveness: Defined as any analgesic benefit obtained secondary to the administration of local anesthetics. This carries significant diagnostic value as to the etiological location, or anatomical origin, of the pain. Duration of benefit is expected to coincide with the duration of the local anesthetic used.  Effectiveness during initial 4-6 hours after procedure (Short-Term Relief): 75 %   Long-term benefit: Defined as any relief past the pharmacologic duration of the local anesthetics.  Effectiveness past the initial 6 hours after procedure (Long-Term Relief): 50 % (2 weeks)   Benefits, current: Defined as benefit present at the time of this evaluation.   Analgesia:  40%   Pharmacotherapy Assessment  Analgesic: Tramadol 100 mg twice  daily, quantity 120/month MME equals 20   Monitoring: [place]  PMP: PDMP reviewed during this encounter.       Pharmacotherapy: No side-effects or adverse reactions reported. Compliance: No problems identified. Effectiveness: Clinically acceptable.  Dewayne Shorter, RN  07/17/2021 11:26 AM  Sign when Signing Visit Nursing Pain Medication Assessment:  Safety precautions to be maintained throughout the outpatient stay will include: orient to surroundings, keep bed in low position, maintain call bell within reach at all times, provide assistance with transfer out of bed and ambulation.  Medication Inspection Compliance: [name] did not comply with our request to bring her pills to be counted. She was reminded that bringing the medication bottles, even when empty, is a requirement.  Medication: None brought in. Pill/Patch Count: None available to be counted. Bottle Appearance: No container available. Did not bring bottle(s) to appointment. Filled Date: N/A Last Medication intake:  Today      UDS:  Summary  Date Value Ref Range Status  09/29/2019 Note  Final    Comment:    ==================================================================== Compliance Drug Analysis, Ur ==================================================================== Test                             Result       Flag       Units Drug Present and Declared for Prescription Verification   7-aminoclonazepam              65           EXPECTED   ng/mg creat    7-aminoclonazepam is an expected metabolite of clonazepam. Source of    clonazepam is a scheduled prescription medication.   Tramadol                       >4310        EXPECTED   ng/mg creat   O-Desmethyltramadol            >4310        EXPECTED   ng/mg creat   N-Desmethyltramadol            3447         EXPECTED   ng/mg creat    Source of tramadol is a prescription medication. O-desmethyltramadol    and N-desmethyltramadol are expected metabolites of tramadol.   Gabapentin                     PRESENT      EXPECTED    Cyclobenzaprine                PRESENT      EXPECTED   Desmethylcyclobenzaprine       PRESENT      EXPECTED    Desmethylcyclobenzaprine is an expected metabolite of    cyclobenzaprine.   Duloxetine                     PRESENT      EXPECTED   Trazodone                      PRESENT      EXPECTED   1,3 chlorophenyl piperazine    PRESENT      EXPECTED    1,3-chlorophenyl piperazine is an expected metabolite of trazodone.   Diphenhydramine                PRESENT  EXPECTED   Propranolol                    PRESENT      EXPECTED Drug Absent but Declared for Prescription Verification   Oxycodone                      Not Detected UNEXPECTED ng/mg creat   Acetaminophen                  Not Detected UNEXPECTED    Acetaminophen, as indicated in the declared medication list, is not    always detected even when used as directed.   Hydroxyzine                    Not Detected UNEXPECTED ==================================================================== Test                      Result    Flag   Units      Ref Range   Creatinine              116              mg/dL      >=20 ==================================================================== Declared Medications:  The flagging and interpretation on this report are based on the  following declared medications.  Unexpected results may arise from  inaccuracies in the declared medications.  **Note: The testing scope of this panel includes these medications:  Clonazepam  Cyclobenzaprine (Flexeril)  Diphenhydramine (Benadryl)  Duloxetine (Cymbalta)  Gabapentin (Neurontin)  Hydroxyzine  Oxycodone (Roxicodone)  Propranolol (Inderal)  Tramadol (Ultram)  Trazodone (Desyrel)  **Note: The testing scope of this panel does not include small to  moderate amounts of these reported medications:  Acetaminophen  **Note: The testing scope of this panel does not include the  following reported medications:  Apixaban  Atorvastatin (Lipitor)  Betamethasone  (Lotrisone)  Budesonide (Symbicort)  Calcium  Ciprofloxacin (Cipro)  Clobetasol  Clotrimazole (Lotrisone)  Esomeprazole (Nexium)  Eye Drops  Formoterol (Symbicort)  Hydralazine (Apresoline)  Hydrochlorothiazide (Hyzaar)  Iron  Losartan (Hyzaar)  Menthol  Multivitamin  Probiotic  Sodium Chloride  Sucralfate (Carafate)  Turmeric  Ubiquinone (CoQ10)  Vitamin B12  Vitamin C  Vitamin D3  Vitamin E ==================================================================== For clinical consultation, please call [phone]. ====================================================================      ROS  Constitutional: Denies any fever or chills Gastrointestinal: No reported hemesis, hematochezia, vomiting, or acute GI distress Musculoskeletal:  LBP Neurological: No reported episodes of acute onset apraxia, aphasia, dysarthria, agnosia, amnesia, paralysis, loss of coordination, or loss of consciousness  Medication Review  CoQ-10, DULoxetine, Fish Oil, Fluocinolone Acetonide Body, Menthol (Topical Analgesic), Multiple Vitamin, Probiotic Product, Propylene Glycol, Turmeric, Vitamin B-12, Vitamin D3, Vitamin E, acetaminophen, atorvastatin, budesonide-formoterol, calcium carbonate, celecoxib, ciclopirox, clobetasol ointment, clonazePAM, clotrimazole-betamethasone, diphenhydrAMINE, enoxaparin, esomeprazole, fexofenadine, fluticasone, gabapentin, hydrALAZINE, hydrOXYzine, linaclotide, losartan-hydrochlorothiazide, methimazole, methocarbamol, propranolol, sucralfate, traMADol, traZODone, vitamin A, and vitamin C  History Review  Allergy: Ms. Cuffie is allergic to cephalexin, ibuprofen, potassium chloride, shellfish allergy, and aspirin. Drug: Ms. Mccarroll  reports that she does not currently use drugs. Alcohol:  reports no history of alcohol use. Tobacco:  reports that she quit smoking about 28 years ago. Her smoking use included cigarettes. She has never used smokeless tobacco. Social: Ms.  Battle  reports that she quit smoking about 28 years ago. Her smoking use included cigarettes. She has never used smokeless tobacco. She reports that she does not currently use drugs. She  reports that she does not drink alcohol. Medical:  has a past medical history of Anxiety, Asthma, Breast mass, Cervical disc disease, Chronic pain syndrome, COPD (chronic obstructive pulmonary disease) (HCC), Degenerative disc disease, lumbar, Depression, Fibromyalgia, Fibromyalgia, GERD (gastroesophageal reflux disease), Glaucoma, Graves disease, Headache, Hemorrhoids, Hyperlipidemia, Hypertension, Hyperthyroidism, Lumbar disc disease, Obesity, Pre-diabetes, Psoriatic arthritis ([place]), and Thyroid disease. Surgical: Ms. Towle  has a past surgical history that includes carpel tunn (Right); Hand surgery (Right); carpel tunnel (Left); Cesarean section; Shoulder surgery (Right); Back surgery; Neck surgery; Total hip arthroplasty (Right); Excision Morton's neuroma (Left, 02/05/2017); Colonoscopy; Joint replacement; Esophagogastroduodenoscopy (egd) with propofol (N/A, 06/26/2017); Knee Arthroplasty (Left, 08/11/2017); Knee arthroscopy (Right, 07/02/2018); Reverse shoulder arthroplasty (Left, 06/24/2019); Thoracic laminectomy for spinal cord stimulator (N/A, 08/07/2020); Colonoscopy with propofol (N/A, 10/20/2020); Cardiac catheterization; Tubal ligation; Knee Arthroplasty (Right, 02/05/2021); and Spinal cord stimulator removal (N/A, 04/09/2021). Family: family history includes Cancer in her mother; Heart disease in her father.  Laboratory Chemistry Profile   Renal Lab Results  Component Value Date   BUN 7 (L) 04/04/2021   CREATININE 0.77 04/04/2021   BCR 11 (L) 04/27/2020   GFRAA 83 04/27/2020   GFRNONAA >60 04/04/2021    Hepatic Lab Results  Component Value Date   AST 21 01/25/2021   ALT 20 01/25/2021   ALBUMIN 4.3 01/25/2021   ALKPHOS 81 01/25/2021   LIPASE 27 12/14/2016    Electrolytes Lab Results  Component Value  Date   NA 139 04/04/2021   K 4.3 04/04/2021   CL 102 04/04/2021   CALCIUM 9.7 04/04/2021   MG 1.9 08/27/2014    Bone No results found for: VD25OH, VD125OH2TOT, GU4403KV4, QV9563OV5, 25OHVITD1, 25OHVITD2, 25OHVITD3, TESTOFREE, TESTOSTERONE  Inflammation (CRP: Acute Phase) (ESR: Chronic Phase) Lab Results  Component Value Date   CRP 0.5 01/25/2021   ESRSEDRATE 6 01/25/2021         Note: Above Lab results reviewed.   Physical Exam  General appearance: Well nourished, well developed, and well hydrated. In no apparent acute distress Mental status: Alert, oriented x 3 (person, place, & time)       Respiratory: No evidence of acute respiratory distress Eyes: PERLA Vitals: BP (!) 141/88 (BP Location: Right Arm, Patient Position: Sitting, Cuff Size: Normal)   Pulse 85   Temp (!) 97 F (36.1 C)   Resp 16   Ht $R'5\' 11"'fm$  (1.803 m)   Wt 260 lb (117.9 kg)   SpO2 99%   BMI 36.26 kg/m  BMI: Estimated body mass index is 36.26 kg/m as calculated from the following:   Height as of this encounter: $RemoveBeforeD'5\' 11"'jljRlJvrpJsPxJ$  (1.803 m).   Weight as of this encounter: 260 lb (117.9 kg). Ideal: Ideal body weight: 70.8 kg (156 lb 1.4 oz) Adjusted ideal body weight: 89.7 kg (197 lb 10.4 oz)  Lumbar Spine Area Exam  Skin & Axial Inspection: Well healed scar from previous spine surgery detected Alignment: Symmetrical Functional ROM: Pain restricted ROM       Stability: No instability detected Muscle Tone/Strength: Functionally intact. No obvious neuro-muscular anomalies detected. Sensory (Neurological): Dermatomal pain pattern   Lower Extremity Exam      Side: Right lower extremity   Side: Left lower extremity  Stability: No instability observed           Stability: No instability observed          Skin & Extremity Inspection: Skin color, temperature, and hair growth are WNL. No peripheral edema or cyanosis. No masses, redness, swelling, asymmetry, or  associated skin lesions. No contractures.   Skin & Extremity  Inspection: Skin color, temperature, and hair growth are WNL. No peripheral edema or cyanosis. No masses, redness, swelling, asymmetry, or associated skin lesions. No contractures.  Functional ROM: Pain restricted for the hip and knee joint                 Functional ROM: Pain restricted ROM for hip and knee joints          Muscle Tone/Strength: Functionally intact. No obvious neuro-muscular anomalies detected.   Muscle Tone/Strength: Functionally intact. No obvious neuro-muscular anomalies detected.  Sensory (Neurological): Neurogenic, arthropathic   Sensory (Neurological): Dermatomal pain pattern        DTR: Patellar: deferred today Achilles: deferred today Plantar: deferred today   DTR: Patellar: deferred today Achilles: deferred today Plantar: deferred today  Palpation: No palpable anomalies   Palpation: No palpable anomalies      Assessment   Status Diagnosis  Controlled Controlled Controlled 1. Chronic pain syndrome   2. Arthritis of sacroiliac joint of both sides   3. Piriformis syndrome of both sides   4. Lumbar radiculopathy   5. History of lumbar laminectomy for spinal cord decompression   6. Failed back surgical syndrome   7. Back pain at L4-L5 level   8. DDD (degenerative disc disease), lumbosacral       Plan of Care   [name] has a current medication list which includes the following long-term medication(s): atorvastatin, budesonide-formoterol, calcium carbonate, clonazepam, duloxetine, duloxetine, fexofenadine, fluticasone, gabapentin, hydralazine, linaclotide, losartan-hydrochlorothiazide, methimazole, propranolol, trazodone, and enoxaparin.  Pharmacotherapy (Medications Ordered): Meds ordered this encounter  Medications   traMADol (ULTRAM) 50 MG tablet    Sig: Take 2 tablets (100 mg total) by mouth every 12 (twelve) hours.    Dispense:  120 tablet    Refill:  2    Fill one day early if pharmacy is closed on scheduled refill date.   methocarbamol  (ROBAXIN) 750 MG tablet    Sig: Take 1 tablet (750 mg total) by mouth every 8 (eight) hours as needed for muscle spasms.    Dispense:  90 tablet    Refill:  11    Do not place this medication, or any other prescription from our practice, on "Automatic Refill". Patient may have prescription filled one day early if pharmacy is closed on scheduled refill date.   Orders:  Orders Placed This Encounter  Procedures   DG Lumbar Spine Complete W/Bend    Patient presents with axial pain with possible radicular component. Please assist [place] in identifying specific level(s) and laterality of any additional findings such as: 1. Facet (Zygapophyseal) joint DJD (Hypertrophy, space narrowing, subchondral sclerosis, and/or osteophyte formation) 2. DDD and/or IVDD (Loss of disc height, desiccation, gas patterns, osteophytes, endplate sclerosis, or "Black disc disease") 3. Pars defects 4. Spondylolisthesis, spondylosis, and/or spondyloarthropathies (include Degree/Grade of displacement in mm) (stability) 5. Vertebral body Fractures (acute/chronic) (state percentage of collapse) 6. Demineralization (osteopenia/osteoporotic) 7. Bone pathology 8. Foraminal narrowing  9. Surgical changes    Standing Status:   Future    Number of Occurrences:   1    Standing Expiration Date:   08/16/2021    Scheduling Instructions:     Imaging must be done as soon as possible. Inform patient that order will expire within 30 days and I will not renew it.    Order Specific Question:   Reason for Exam (SYMPTOM  OR DIAGNOSIS REQUIRED)    Answer:  Low back pain    Order Specific Question:   Preferred imaging location?    Answer:   [facility]    Order Specific Question:   Call Results- Best Contact Number?    Answer:   [phone] ([facility])    Order Specific Question:   Radiology Contrast Protocol - do NOT remove file path    Answer:   [email or web address]    Order Specific  Question:   Release to patient    Answer:   Immediate   DG Si Joints    Standing Status:   Future    Number of Occurrences:   1    Standing Expiration Date:   08/16/2021    Scheduling Instructions:     Imaging must be done as soon as possible. Inform patient that order will expire within 30 days and I will not renew it.    Order Specific Question:   Reason for Exam (SYMPTOM  OR DIAGNOSIS REQUIRED)    Answer:   Sacroiliac joint pain    Order Specific Question:   Preferred imaging location?    Answer:   [facility]    Order Specific Question:   Call Results- Best Contact Number?    Answer:   [phone] ([facility])    Order Specific Question:   Release to patient    Answer:   Immediate   Follow-up plan:   Return in about 4 months (around 11/01/2021) for Medication Management, in person.    Recent Visits Date Type Provider Dept  06/25/21 Procedure visit [name], MD [facility]  06/11/21 Office Visit [name], MD [facility]  05/07/21 Procedure visit [name], MD [facility]  04/24/21 Office Visit [name], MD [facility]  Showing recent visits within past 90 days and meeting all other requirements Today's Visits Date Type Provider Dept  07/17/21 Office Visit [name], MD [facility]  Showing today's visits and meeting all other requirements Future Appointments No visits were found meeting these conditions. Showing future appointments within next 90 days and meeting all other requirements I discussed the assessment and treatment plan with the patient. The patient was provided an opportunity to ask questions and all were answered. The patient agreed with the plan and demonstrated an understanding of the instructions.  Patient advised to call back or seek an in-person evaluation if the symptoms or condition worsens.  Duration of encounter: 49minutes.  Note by: [name], MD Date:  07/17/2021; Time: 12:47 PM

## 2021-07-17 NOTE — Progress Notes (Signed)
Nursing Pain Medication Assessment:  Safety precautions to be maintained throughout the outpatient stay will include: orient to surroundings, keep bed in low position, maintain call bell within reach at all times, provide assistance with transfer out of bed and ambulation.  Medication Inspection Compliance: Christine Higgins did not comply with our request to bring her pills to be counted. She was reminded that bringing the medication bottles, even when empty, is a requirement.  Medication: None brought in. Pill/Patch Count: None available to be counted. Bottle Appearance: No container available. Did not bring bottle(s) to appointment. Filled Date: N/A Last Medication intake:  Today

## 2021-07-17 NOTE — Patient Instructions (Signed)
You will have xrays completed by next appt. with pain clinic.

## 2021-07-26 ENCOUNTER — Other Ambulatory Visit: Payer: Self-pay

## 2021-07-26 ENCOUNTER — Ambulatory Visit
Admission: RE | Admit: 2021-07-26 | Discharge: 2021-07-26 | Disposition: A | Discharge status: Home / Self Care | Payer: Medicare HMO | Source: Ambulatory Visit | Attending: Obstetrics and Gynecology | Admitting: Obstetrics and Gynecology

## 2021-07-26 ENCOUNTER — Other Ambulatory Visit: Payer: Self-pay | Admitting: Obstetrics and Gynecology

## 2021-07-26 DIAGNOSIS — N6081 Other benign mammary dysplasias of right breast: Secondary | ICD-10-CM

## 2021-07-26 DIAGNOSIS — L723 Sebaceous cyst: Secondary | ICD-10-CM | POA: Diagnosis not present

## 2021-07-26 DIAGNOSIS — J441 Chronic obstructive pulmonary disease with (acute) exacerbation: Secondary | ICD-10-CM | POA: Diagnosis not present

## 2021-07-26 DIAGNOSIS — N6001 Solitary cyst of right breast: Secondary | ICD-10-CM | POA: Diagnosis not present

## 2021-07-26 DIAGNOSIS — R928 Other abnormal and inconclusive findings on diagnostic imaging of breast: Secondary | ICD-10-CM | POA: Diagnosis not present

## 2021-08-06 ENCOUNTER — Telehealth: Payer: Self-pay | Admitting: Student in an Organized Health Care Education/Training Program

## 2021-08-06 DIAGNOSIS — M47818 Spondylosis without myelopathy or radiculopathy, sacral and sacrococcygeal region: Secondary | ICD-10-CM

## 2021-08-06 DIAGNOSIS — G5703 Lesion of sciatic nerve, bilateral lower limbs: Secondary | ICD-10-CM

## 2021-08-06 DIAGNOSIS — G894 Chronic pain syndrome: Secondary | ICD-10-CM

## 2021-08-06 NOTE — Telephone Encounter (Signed)
Requesting another R. SI injection. Had this and pyriformis injection on 07-17-22.

## 2021-09-04 ENCOUNTER — Ambulatory Visit
Payer: Medicare HMO | Attending: Student in an Organized Health Care Education/Training Program | Admitting: Student in an Organized Health Care Education/Training Program

## 2021-09-04 ENCOUNTER — Encounter: Payer: Self-pay | Admitting: Student in an Organized Health Care Education/Training Program

## 2021-09-04 DIAGNOSIS — M1612 Unilateral primary osteoarthritis, left hip: Secondary | ICD-10-CM | POA: Diagnosis not present

## 2021-09-04 DIAGNOSIS — G5703 Lesion of sciatic nerve, bilateral lower limbs: Secondary | ICD-10-CM

## 2021-09-04 DIAGNOSIS — Z9889 Other specified postprocedural states: Secondary | ICD-10-CM | POA: Diagnosis not present

## 2021-09-04 DIAGNOSIS — G894 Chronic pain syndrome: Secondary | ICD-10-CM

## 2021-09-04 DIAGNOSIS — M961 Postlaminectomy syndrome, not elsewhere classified: Secondary | ICD-10-CM

## 2021-09-04 NOTE — Patient Instructions (Signed)

## 2021-09-04 NOTE — Progress Notes (Signed)
Patient: Christine Higgins  Service Category: E/M  Provider: [name], MD  DOB: May 15, 1956  DOS: 09/04/2021  Location: Office  MRN: [id number]  Setting: Ambulatory outpatient  Referring Provider: Kirk Ruths, MD  Type: Established Patient  Specialty: Interventional Pain Management  PCP: Kirk Ruths, MD  Location: Remote location  Delivery: TeleHealth     Virtual Encounter - Pain Management PROVIDER NOTE: Information contained herein reflects review and annotations entered in association with encounter. Interpretation of such information and data should be left to medically-trained personnel. Information provided to patient can be located elsewhere in the medical record under "Patient Instructions". Document created using STT-dictation technology, any transcriptional errors that may result from process are unintentional.    Contact & Pharmacy Preferred: [phone] Home: [phone] (home) Mobile: There is no such number on file (mobile). E-mail: [email or web address]  [facility] - [place], [place] - [address] [place] [address] Phone: [phone] Fax: [phone]   Pre-screening  Christine Higgins offered "in-person" vs "virtual" encounter. She indicated preferring virtual for this encounter.   Reason COVID-19*   Social distancing based on CDC and AMA recommendations.   I contacted Christine Higgins on 09/04/2021 via telephone.      I clearly identified myself as [name], MD. I verified that I was speaking with the correct person using two identifiers (Name: Christine Higgins, and date of birth: [birth year]).  Consent I sought verbal advanced consent from Christine Higgins for virtual visit interactions. I informed Christine Higgins of possible security and privacy concerns, risks, and limitations associated with providing "not-in-person" medical evaluation and management services. I also informed Christine Higgins of the availability of "in-person" appointments.  Finally, I informed her that there would be a charge for the virtual visit and that she could be  personally, fully or partially, financially responsible for it. Christine Higgins expressed understanding and agreed to proceed.   Historic Elements   Christine Higgins is a 66 y.o. year old, female patient evaluated today after our last contact on 08/06/2021. Christine Higgins  has a past medical history of Anxiety, Asthma, Breast mass, Cervical disc disease, Chronic pain syndrome, COPD (chronic obstructive pulmonary disease) ([place]), Degenerative disc disease, lumbar, Depression, Fibromyalgia, Fibromyalgia, GERD (gastroesophageal reflux disease), Glaucoma, Graves disease, Headache, Hemorrhoids, Hyperlipidemia, Hypertension, Hyperthyroidism, Lumbar disc disease, Obesity, Pre-diabetes, Psoriatic arthritis ([place]), and Thyroid disease. She also  has a past surgical history that includes carpel tunn (Right); Hand surgery (Right); carpel tunnel (Left); Cesarean section; Shoulder surgery (Right); Back surgery; Neck surgery; Total hip arthroplasty (Right); Excision Morton's neuroma (Left, 02/05/2017); Colonoscopy; Joint replacement; Esophagogastroduodenoscopy (egd) with propofol (N/A, 06/26/2017); Knee Arthroplasty (Left, 08/11/2017); Knee arthroscopy (Right, 07/02/2018); Reverse shoulder arthroplasty (Left, 06/24/2019); Thoracic laminectomy for spinal cord stimulator (N/A, 08/07/2020); Colonoscopy with propofol (N/A, 10/20/2020); Cardiac catheterization; Tubal ligation; Knee Arthroplasty (Right, 02/05/2021); and Spinal cord stimulator removal (N/A, 04/09/2021). Christine Higgins has a current medication list which includes the following prescription(s): acetaminophen, vitamin c, atorvastatin, vitamin a, budesonide-formoterol, calcium carbonate, celecoxib, vitamin d3, ciclopirox, clobetasol ointment, clonazepam, clotrimazole-betamethasone, coq-10, vitamin b-12, diphenhydramine, duloxetine, duloxetine, enoxaparin, esomeprazole, fexofenadine, fluocinolone  acetonide body, fluticasone, gabapentin, hydralazine, hydroxyzine, linaclotide, losartan-hydrochlorothiazide, menthol (topical analgesic), methimazole, methocarbamol, multiple vitamin, fish oil, probiotic product, propranolol, propylene glycol, sucralfate, tramadol, trazodone, turmeric, and vitamin e. She  reports that she quit smoking about 29 years ago. Her smoking use included cigarettes. She has never used smokeless tobacco. She reports that she does not currently  use drugs. She reports that she does not drink alcohol. Christine Higgins is allergic to cephalexin, ibuprofen, potassium chloride, shellfish allergy, and aspirin.   HPI  Today, she is being contacted for worsening of previously known (established) problem  Increased left hip pain due to hip osteoarthritis.  She is to get these previously at physical medicine and rehab and would like to keep her care with pain management to reduce the number of provider that she sees. Also having right piriformis/buttock pain status post bilateral SI joint and piriformis injection on 06/25/2021.  Wants to have her right piriformis injection repeated.   Pharmacotherapy Assessment   Opioid Analgesic: Tramadol 100 mg twice daily, quantity 120/month MME equals 20   Monitoring: [place] PMP: PDMP reviewed during this encounter.       Pharmacotherapy: No side-effects or adverse reactions reported. Compliance: No problems identified. Effectiveness: Clinically acceptable. Plan: Refer to "POC". UDS:  Summary  Date Value Ref Range Status  09/29/2019 Note  Final    Comment:    ==================================================================== Compliance Drug Analysis, Ur ==================================================================== Test                             Result       Flag       Units Drug Present and Declared for Prescription Verification   7-aminoclonazepam              65           EXPECTED   ng/mg creat    7-aminoclonazepam is an expected  metabolite of clonazepam. Source of    clonazepam is a scheduled prescription medication.   Tramadol                       >4310        EXPECTED   ng/mg creat   O-Desmethyltramadol            >4310        EXPECTED   ng/mg creat   N-Desmethyltramadol            3447         EXPECTED   ng/mg creat    Source of tramadol is a prescription medication. O-desmethyltramadol    and N-desmethyltramadol are expected metabolites of tramadol.   Gabapentin                     PRESENT      EXPECTED   Cyclobenzaprine                PRESENT      EXPECTED   Desmethylcyclobenzaprine       PRESENT      EXPECTED    Desmethylcyclobenzaprine is an expected metabolite of    cyclobenzaprine.   Duloxetine                     PRESENT      EXPECTED   Trazodone                      PRESENT      EXPECTED   1,3 chlorophenyl piperazine    PRESENT      EXPECTED    1,3-chlorophenyl piperazine is an expected metabolite of trazodone.   Diphenhydramine                PRESENT      EXPECTED   Propranolol  PRESENT      EXPECTED Drug Absent but Declared for Prescription Verification   Oxycodone                      Not Detected UNEXPECTED ng/mg creat   Acetaminophen                  Not Detected UNEXPECTED    Acetaminophen, as indicated in the declared medication list, is not    always detected even when used as directed.   Hydroxyzine                    Not Detected UNEXPECTED ==================================================================== Test                      Result    Flag   Units      Ref Range   Creatinine              116              mg/dL      >=20 ==================================================================== Declared Medications:  The flagging and interpretation on this report are based on the  following declared medications.  Unexpected results may arise from  inaccuracies in the declared medications.  **Note: The testing scope of this panel includes these medications:   Clonazepam  Cyclobenzaprine (Flexeril)  Diphenhydramine (Benadryl)  Duloxetine (Cymbalta)  Gabapentin (Neurontin)  Hydroxyzine  Oxycodone (Roxicodone)  Propranolol (Inderal)  Tramadol (Ultram)  Trazodone (Desyrel)  **Note: The testing scope of this panel does not include small to  moderate amounts of these reported medications:  Acetaminophen  **Note: The testing scope of this panel does not include the  following reported medications:  Apixaban  Atorvastatin (Lipitor)  Betamethasone (Lotrisone)  Budesonide (Symbicort)  Calcium  Ciprofloxacin (Cipro)  Clobetasol  Clotrimazole (Lotrisone)  Esomeprazole (Nexium)  Eye Drops  Formoterol (Symbicort)  Hydralazine (Apresoline)  Hydrochlorothiazide (Hyzaar)  Iron  Losartan (Hyzaar)  Menthol  Multivitamin  Probiotic  Sodium Chloride  Sucralfate (Carafate)  Turmeric  Ubiquinone (CoQ10)  Vitamin B12  Vitamin C  Vitamin D3  Vitamin E ==================================================================== For clinical consultation, please call [phone]. ====================================================================      Laboratory Chemistry Profile   Renal Lab Results  Component Value Date   BUN 7 (L) 04/04/2021   CREATININE 0.77 04/04/2021   BCR 11 (L) 04/27/2020   GFRAA 83 04/27/2020   GFRNONAA >60 04/04/2021    Hepatic Lab Results  Component Value Date   AST 21 01/25/2021   ALT 20 01/25/2021   ALBUMIN 4.3 01/25/2021   ALKPHOS 81 01/25/2021   LIPASE 27 12/14/2016    Electrolytes Lab Results  Component Value Date   NA 139 04/04/2021   K 4.3 04/04/2021   CL 102 04/04/2021   CALCIUM 9.7 04/04/2021   MG 1.9 08/27/2014    Bone No results found for: VD25OH, VD125OH2TOT, HK7425ZD6, LO7564PP2, 25OHVITD1, 25OHVITD2, 25OHVITD3, TESTOFREE, TESTOSTERONE  Inflammation (CRP: Acute Phase) (ESR: Chronic Phase) Lab Results  Component Value Date   CRP 0.5 01/25/2021   ESRSEDRATE 6 01/25/2021          Note: Above Lab results reviewed.  Imaging  US BREAST LTD UNI RIGHT INC AXILLA CLINICAL DATA:  Palpable areas in the RIGHT inner breast.  EXAM: DIGITAL DIAGNOSTIC BILATERAL MAMMOGRAM WITH TOMOSYNTHESIS AND CAD; ULTRASOUND RIGHT BREAST LIMITED  TECHNIQUE: Bilateral digital diagnostic mammography and breast tomosynthesis was performed. The images were evaluated with computer-aided detection.; Targeted ultrasound  examination of the right breast was performed  COMPARISON:  Previous exam(s).  ACR Breast Density Category a: The breast tissue is almost entirely fatty.  FINDINGS: Spot compression tomosynthesis views were obtained of the sites of palpable concern in the RIGHT inner breast. Subjacent to the sites of palpable concern in the RIGHT inner breast are 2 fat containing oval circumscribed superficial masses. Multiple additional similar-appearing oval circumscribed fat containing masses are noted throughout bilateral breasts/axilla. This is most consistent with benign steatocystoma multiplex. No suspicious mass, distortion, or microcalcifications are identified to suggest presence of malignancy bilaterally.  On physical exam, no suspicious mass is appreciated. There are superficial mobile nodules appreciated at the sites of palpable concern  Targeted ultrasound was performed of the sites of palpable concern in the RIGHT inner breast. At 2 o'clock 15 cm from the nipple, there is an oval circumscribed isoechoic mass directly subjacent to the skin. It measures 10 x 9 by 9 mm. At 2 o'clock 18 cm from the nipple, there is an oval circumscribed isoechoic mass directly subjacent to the skin. It measures 10 x 8 by 10 mm. These are consistent with benign sebaceous cysts in the setting of steatocystoma multiplex.  IMPRESSION: 1. There are benign superficial fat containing masses at the sites of palpable concern in the RIGHT inner breast. This is consistent with benign  steatocystoma multiplex. 2. No mammographic evidence of malignancy bilaterally.  RECOMMENDATION: Screening mammogram in one year.(Code:SM-B-01Y)  I have discussed the findings and recommendations with the patient. If applicable, a reminder letter will be sent to the patient regarding the next appointment.  BI-RADS CATEGORY  2: Benign.  Electronically Signed   By: Valentino Saxon M.D.   On: 07/26/2021 12:55 MM DIAG BREAST TOMO BILATERAL CLINICAL DATA:  Palpable areas in the RIGHT inner breast.  EXAM: DIGITAL DIAGNOSTIC BILATERAL MAMMOGRAM WITH TOMOSYNTHESIS AND CAD; ULTRASOUND RIGHT BREAST LIMITED  TECHNIQUE: Bilateral digital diagnostic mammography and breast tomosynthesis was performed. The images were evaluated with computer-aided detection.; Targeted ultrasound examination of the right breast was performed  COMPARISON:  Previous exam(s).  ACR Breast Density Category a: The breast tissue is almost entirely fatty.  FINDINGS: Spot compression tomosynthesis views were obtained of the sites of palpable concern in the RIGHT inner breast. Subjacent to the sites of palpable concern in the RIGHT inner breast are 2 fat containing oval circumscribed superficial masses. Multiple additional similar-appearing oval circumscribed fat containing masses are noted throughout bilateral breasts/axilla. This is most consistent with benign steatocystoma multiplex. No suspicious mass, distortion, or microcalcifications are identified to suggest presence of malignancy bilaterally.  On physical exam, no suspicious mass is appreciated. There are superficial mobile nodules appreciated at the sites of palpable concern  Targeted ultrasound was performed of the sites of palpable concern in the RIGHT inner breast. At 2 o'clock 15 cm from the nipple, there is an oval circumscribed isoechoic mass directly subjacent to the skin. It measures 10 x 9 by 9 mm. At 2 o'clock 18 cm from the nipple,  there is an oval circumscribed isoechoic mass directly subjacent to the skin. It measures 10 x 8 by 10 mm. These are consistent with benign sebaceous cysts in the setting of steatocystoma multiplex.  IMPRESSION: 1. There are benign superficial fat containing masses at the sites of palpable concern in the RIGHT inner breast. This is consistent with benign steatocystoma multiplex. 2. No mammographic evidence of malignancy bilaterally.  RECOMMENDATION: Screening mammogram in one year.(Code:SM-B-01Y)  I have discussed the findings and recommendations  with the patient. If applicable, a reminder letter will be sent to the patient regarding the next appointment.  BI-RADS CATEGORY  2: Benign.  Electronically Signed   By: Valentino Saxon M.D.   On: 07/26/2021 12:55  Assessment  The primary encounter diagnosis was Primary osteoarthritis of left hip. Diagnoses of Piriformis syndrome of both sides, History of lumbar laminectomy for spinal cord decompression, Failed back surgical syndrome, and Chronic pain syndrome were also pertinent to this visit.  Plan of Care   Orders:  Orders Placed This Encounter  Procedures   HIP INJECTION    Standing Status:   Future    Standing Expiration Date:   12/03/2021    Scheduling Instructions:     Purpose: Therapeutic/Diagnostic     Indication: Hip pain 2ry to Trochanteric Burlitis unspecified side (M70.60).     Left hip without sedation     Timeframe: As soon as the schedule permits.   TRIGGER POINT INJECTION    Area: Buttocks region (gluteal area) Indications: Piriformis muscle pain; Right (G57.01) piriformis-syndrome; piriformis muscle spasms (J49.702). CPT code: 20552    Scheduling Instructions:     Right piriformis TPI without sedation    Order Specific Question:   Where will this procedure be performed?    Answer:   [facility]   Follow-up plan:   Return in about 1 week (around 09/11/2021) for Left hip (bursa) + right pirformis  TPI , without sedation.     Status post right L5-S1 ESI 03/23/2019, 6 out of 8 cc injected, #2 on 04/21/19, 5 cc more concentrated, #3 on 06/14/2019: 6 cc, #4 09/29/19 left L5/S1 [place] , #5 12/08/19, #6 02/23/20. [employer] SCS trial 06/26/20 significant functional and analgesic benefit.  Patient ambulated during clinic which she has never done before.  Plan for implant with Dr Lacinda Axon             Recent Visits Date Type Provider Dept  07/17/21 Office Visit [name], MD [facility]  06/25/21 Procedure visit [name], MD [facility]  06/11/21 Office Visit [name], MD [facility]  Showing recent visits within past 90 days and meeting all other requirements Today's Visits Date Type Provider Dept  09/04/21 Office Visit [name], MD [facility]  Showing today's visits and meeting all other requirements Future Appointments Date Type Provider Dept  10/25/21 Appointment [name], MD [facility]  Showing future appointments within next 90 days and meeting all other requirements  I discussed the assessment and treatment plan with the patient. The patient was provided an opportunity to ask questions and all were answered. The patient agreed with the plan and demonstrated an understanding of the instructions.  Patient advised to call back or seek an in-person evaluation if the symptoms or condition worsens.  Duration of encounter: 30 minutes.  Note by: [name], MD Date: 09/04/2021; Time: 1:24 PM

## 2021-09-05 ENCOUNTER — Encounter (INDEPENDENT_AMBULATORY_CARE_PROVIDER_SITE_OTHER): Payer: Self-pay

## 2021-09-12 ENCOUNTER — Ambulatory Visit (HOSPITAL_BASED_OUTPATIENT_CLINIC_OR_DEPARTMENT_OTHER): Payer: Medicare HMO | Admitting: Student in an Organized Health Care Education/Training Program

## 2021-09-12 ENCOUNTER — Other Ambulatory Visit: Payer: Self-pay

## 2021-09-12 ENCOUNTER — Encounter: Payer: Self-pay | Admitting: Student in an Organized Health Care Education/Training Program

## 2021-09-12 ENCOUNTER — Ambulatory Visit
Admission: RE | Admit: 2021-09-12 | Discharge: 2021-09-12 | Disposition: A | Discharge status: Home / Self Care | Payer: Medicare HMO | Source: Ambulatory Visit | Attending: Student in an Organized Health Care Education/Training Program | Admitting: Student in an Organized Health Care Education/Training Program

## 2021-09-12 VITALS — BP 142/95 | HR 76 | Temp 97.3°F | Resp 18 | Ht 71.0 in | Wt 260.0 lb

## 2021-09-12 DIAGNOSIS — M1612 Unilateral primary osteoarthritis, left hip: Secondary | ICD-10-CM

## 2021-09-12 DIAGNOSIS — G894 Chronic pain syndrome: Secondary | ICD-10-CM | POA: Insufficient documentation

## 2021-09-12 DIAGNOSIS — G5703 Lesion of sciatic nerve, bilateral lower limbs: Secondary | ICD-10-CM

## 2021-09-12 MED ORDER — ROPIVACAINE HCL 2 MG/ML IJ SOLN
9.0000 mL | Freq: Once | INTRAMUSCULAR | Status: AC
  Administered 2021-09-12: 9 mL via PERINEURAL

## 2021-09-12 MED ORDER — DEXAMETHASONE SODIUM PHOSPHATE 10 MG/ML IJ SOLN
INTRAMUSCULAR | Status: AC
  Filled 2021-09-12: qty 1

## 2021-09-12 MED ORDER — LIDOCAINE HCL 2 % IJ SOLN
INTRAMUSCULAR | Status: AC
  Filled 2021-09-12: qty 20

## 2021-09-12 MED ORDER — DEXAMETHASONE SODIUM PHOSPHATE 10 MG/ML IJ SOLN
10.0000 mg | Freq: Once | INTRAMUSCULAR | Status: AC
  Administered 2021-09-12: 10 mg

## 2021-09-12 MED ORDER — METHYLPREDNISOLONE ACETATE 40 MG/ML IJ SUSP
INTRAMUSCULAR | Status: AC
  Filled 2021-09-12: qty 1

## 2021-09-12 MED ORDER — ROPIVACAINE HCL 2 MG/ML IJ SOLN
INTRAMUSCULAR | Status: AC
  Filled 2021-09-12: qty 20

## 2021-09-12 MED ORDER — IOHEXOL 180 MG/ML  SOLN
10.0000 mL | Freq: Once | INTRAMUSCULAR | Status: AC
  Administered 2021-09-12: 5 mL via INTRA_ARTICULAR

## 2021-09-12 MED ORDER — METHYLPREDNISOLONE ACETATE 40 MG/ML IJ SUSP
40.0000 mg | Freq: Once | INTRAMUSCULAR | Status: AC
  Administered 2021-09-12: 40 mg via INTRA_ARTICULAR

## 2021-09-12 MED ORDER — LIDOCAINE HCL 2 % IJ SOLN
20.0000 mL | Freq: Once | INTRAMUSCULAR | Status: AC
  Administered 2021-09-12: 400 mg

## 2021-09-12 NOTE — Progress Notes (Signed)
PROVIDER NOTE: Interpretation of information contained herein should be left to medically-trained personnel. Specific patient instructions are provided elsewhere under "Patient Instructions" section of medical record. This document was created in part using STT-dictation technology, any transcriptional errors that may result from this process are unintentional.  Patient: Christine Higgins Type: Established DOB: 11/01/1955 MRN: [id number] PCP: Kirk Ruths, MD  Service: Procedure DOS: 09/12/2021 Setting: Ambulatory Location: Ambulatory outpatient facility Delivery: Face-to-face Provider: [name], MD Specialty: Interventional Pain Management Specialty designation: 09 Location: Outpatient facility Ref. Prov.: [name], MD    Primary Reason for Visit: Interventional Pain Management Treatment. CC: Hip Pain    Procedure:          Anesthesia, Analgesia, Anxiolysis:  Type: LEFT Hip bursa injection  and right Piriformis TPI   Primary Purpose: Diagnostic/Therapeutric Region: left posterior Upper (proximal) Femoral Region  Target Area: Trochanteric Bursa (left), right piriformis Approach: Posterior approach   Type: Local Anesthesia Local Anesthetic: Lidocaine 1-2% Sedation: None  Indication(s):  Analgesia Route: Infiltration ([place]/IM) IV Access: N/A   Position: Prone   1. Piriformis syndrome of both sides   2. Primary osteoarthritis of left hip   3. Chronic pain syndrome    NAS-11 Pain score:   Pre-procedure: 8 /10   Post-procedure: 1  (1/10 Hip. 3/10 Piriformis)/10     Pre-op H&P Assessment:  Christine Higgins is a 66 y.o. (year old), female patient, seen today for interventional treatment. She  has a past surgical history that includes carpel tunn (Right); Hand surgery (Right); carpel tunnel (Left); Cesarean section; Shoulder surgery (Right); Back surgery; Neck surgery; Total hip arthroplasty (Right); Excision Morton's neuroma (Left, 02/05/2017); Colonoscopy; Joint replacement;  Esophagogastroduodenoscopy (egd) with propofol (N/A, 06/26/2017); Knee Arthroplasty (Left, 08/11/2017); Knee arthroscopy (Right, 07/02/2018); Reverse shoulder arthroplasty (Left, 06/24/2019); Thoracic laminectomy for spinal cord stimulator (N/A, 08/07/2020); Colonoscopy with propofol (N/A, 10/20/2020); Cardiac catheterization; Tubal ligation; Knee Arthroplasty (Right, 02/05/2021); and Spinal cord stimulator removal (N/A, 04/09/2021). Christine Higgins has a current medication list which includes the following prescription(s): acetaminophen, vitamin c, atorvastatin, vitamin a, budesonide-formoterol, calcium carbonate, celecoxib, vitamin d3, ciclopirox, clobetasol ointment, clonazepam, coq-10, vitamin b-12, diphenhydramine, duloxetine, duloxetine, esomeprazole, fexofenadine, fluocinolone acetonide body, fluticasone, gabapentin, hydralazine, hydroxyzine, linaclotide, losartan-hydrochlorothiazide, menthol (topical analgesic), methimazole, methocarbamol, multiple vitamin, fish oil, probiotic product, propranolol, propylene glycol, sucralfate, tramadol, trazodone, turmeric, vitamin e, clotrimazole-betamethasone, and enoxaparin. Her primarily concern today is the Hip Pain  Initial Vital Signs:  Pulse/HCG Rate: 87  Temp: (!) 97.3 F (36.3 C) Resp: 13 BP: (!) 159/94 SpO2: 99 %  BMI: Estimated body mass index is 36.26 kg/m as calculated from the following:   Height as of this encounter: 5\' 11"  (1.803 m).   Weight as of this encounter: 260 lb (117.9 kg).  Risk Assessment: Allergies: Reviewed. She is allergic to cephalexin, ibuprofen, potassium chloride, shellfish allergy, and aspirin.  Allergy Precautions: None required Coagulopathies: Reviewed. None identified.  Blood-thinner therapy: None at this time Active Infection(s): Reviewed. None identified. Christine Higgins is afebrile  Site Confirmation: Christine Higgins was asked to confirm the procedure and laterality before marking the site Procedure checklist: Completed Consent:  Before the procedure and under the influence of no sedative(s), amnesic(s), or anxiolytics, the patient was informed of the treatment options, risks and possible complications. To fulfill our ethical and legal obligations, as recommended by the American Medical Association's Code of Ethics, I have informed the patient of my clinical impression; the nature and purpose of the treatment or procedure; the risks, benefits, and possible complications  of the intervention; the alternatives, including doing nothing; the risk(s) and benefit(s) of the alternative treatment(s) or procedure(s); and the risk(s) and benefit(s) of doing nothing. The patient was provided information about the general risks and possible complications associated with the procedure. These may include, but are not limited to: failure to achieve desired goals, infection, bleeding, organ or nerve damage, allergic reactions, paralysis, and death. In addition, the patient was informed of those risks and complications associated to the procedure, such as failure to decrease pain; infection; bleeding; organ or nerve damage with subsequent damage to sensory, motor, and/or autonomic systems, resulting in permanent pain, numbness, and/or weakness of one or several areas of the body; allergic reactions; (i.e.: anaphylactic reaction); and/or death. Furthermore, the patient was informed of those risks and complications associated with the medications. These include, but are not limited to: allergic reactions (i.e.: anaphylactic or anaphylactoid reaction(s)); adrenal axis suppression; blood sugar elevation that in diabetics may result in ketoacidosis or comma; water retention that in patients with history of congestive heart failure may result in shortness of breath, pulmonary edema, and decompensation with resultant heart failure; weight gain; swelling or edema; medication-induced neural toxicity; particulate matter embolism and blood vessel occlusion with  resultant organ, and/or nervous system infarction; and/or aseptic necrosis of one or more joints. Finally, the patient was informed that Medicine is not an exact science; therefore, there is also the possibility of unforeseen or unpredictable risks and/or possible complications that may result in a catastrophic outcome. The patient indicated having understood very clearly. We have given the patient no guarantees and we have made no promises. Enough time was given to the patient to ask questions, all of which were answered to the patient's satisfaction. Ms. Detert has indicated that she wanted to continue with the procedure. Attestation: I, the ordering provider, attest that I have discussed with the patient the benefits, risks, side-effects, alternatives, likelihood of achieving goals, and potential problems during recovery for the procedure that I have provided informed consent. Date   Time: 09/12/2021 11:37 AM  Pre-Procedure Preparation:  Monitoring: As per clinic protocol. Respiration, ETCO2, SpO2, BP, heart rate and rhythm monitor placed and checked for adequate function Safety Precautions: Patient was assessed for positional comfort and pressure points before starting the procedure. Time-out: I initiated and conducted the "Time-out" before starting the procedure, as per protocol. The patient was asked to participate by confirming the accuracy of the "Time Out" information. Verification of the correct person, site, and procedure were performed and confirmed by me, the nursing staff, and the patient. "Time-out" conducted as per Joint Commission's Universal Protocol (UP.01.01.01). Time: 1158  Description of Procedure:          Area Prepped: Entire Posterolateral hip area. DuraPrep (Iodine Povacrylex [0.7% available iodine] and Isopropyl Alcohol, 74% w/w) Safety Precautions: Aspiration looking for blood return was conducted prior to all injections. At no point did we inject any substances, as a needle  was being advanced. No attempts were made at seeking any paresthesias. Safe injection practices and needle disposal techniques used. Medications properly checked for expiration dates. SDV (single dose vial) medications used. Description of the Procedure: Protocol guidelines were followed. The patient was placed in position over the procedure table. The target area was identified and the area prepped in the usual manner. Skin & deeper tissues infiltrated with local anesthetic. Appropriate amount of time allowed to pass for local anesthetics to take effect. The procedure needles were then advanced to the target area. Proper needle placement  secured. Negative aspiration confirmed. Solution injected in intermittent fashion, asking for systemic symptoms every 0.5cc of injectate. The needles were then removed and the area cleansed, making sure to leave some of the prepping solution back to take advantage of its long term bactericidal properties. Vitals:   09/12/21 1141 09/12/21 1155 09/12/21 1205 09/12/21 1210  BP: (!) 159/94 (!) 158/99 (!) 145/99 (!) 142/95  Pulse: 87 78 79 76  Resp:  13 13 18   Temp: (!) 97.3 F (36.3 C)     SpO2: 99% 100% 100% 100%  Weight: 260 lb (117.9 kg)     Height: 5\' 11"  (1.803 m)       Start Time: 1158 hrs. End Time: 1205 hrs.           Materials:  Needle(s) Type: Spinal Needle Gauge: 22G Length: 5.0-in Medication(s): Please see orders for medications and dosing details.  Left greater trochanteric bursa injection: 5 cc solution made of 4 cc of 0.2% ropivacaine, 1 cc of Decadron 10 mg/cc.  Injected into left greater trochanteric bursa after contrast confirmation.  Afterwards a right piriformis trigger point injection was done.  1 cm inferior and 1 cm lateral to the inferior pole of the SI joint, the piriformis muscle was identified with contrast delineating muscle striations.  5 cc solution made of 4 cc of 0.2% ropivacaine 1 cc of methylprednisolone, 40 mg/cc was  injected into the right piriformis muscle.  No paresthesias noted of right leg during injection.  Imaging Guidance (Non-Spinal):          Type of Imaging Technique: Fluoroscopy Guidance (Non-Spinal) for both the greater trochanteric bursa injection and piriformis TPI Indication(s): Assistance in needle guidance and placement for procedures requiring needle placement in or near specific anatomical locations not easily accessible without such assistance. Exposure Time: Please see nurses notes. Contrast: Before injecting any contrast, we confirmed that the patient did not have an allergy to iodine, shellfish, or radiological contrast. Once satisfactory needle placement was completed at the desired level, radiological contrast was injected. Contrast injected under live fluoroscopy. No contrast complications. See chart for type and volume of contrast used. Fluoroscopic Guidance: I was personally present during the use of fluoroscopy. "Tunnel Vision Technique" used to obtain the best possible view of the target area. Parallax error corrected before commencing the procedure. "Direction-depth-direction" technique used to introduce the needle under continuous pulsed fluoroscopy. Once target was reached, antero-posterior, oblique, and lateral fluoroscopic projection used confirm needle placement in all planes. Images permanently stored in EMR. Interpretation: I personally interpreted the imaging intraoperatively. Adequate needle placement confirmed in multiple planes. Appropriate spread of contrast into desired area was observed. No evidence of afferent or efferent intravascular uptake. Permanent images saved into the patient's record.  Post-operative Assessment:  Post-procedure Vital Signs:  Pulse/HCG Rate: 76 (NSR)  Temp:  (!) 97.3 F (36.3 C) Resp: 18 BP:  (!) 142/95 SpO2: 100 %  EBL: None  Complications: No immediate post-treatment complications observed by team, or reported by patient.  Note: The  patient tolerated the entire procedure well. A repeat set of vitals were taken after the procedure and the patient was kept under observation following institutional policy, for this type of procedure. Post-procedural neurological assessment was performed, showing return to baseline, prior to discharge. The patient was provided with post-procedure discharge instructions, including a section on how to identify potential problems. Should any problems arise concerning this procedure, the patient was given instructions to immediately contact us, at any time, without hesitation. In  any case, we plan to contact the patient by telephone for a follow-up status report regarding this interventional procedure.  Comments:  No additional relevant information.  Plan of Care  Orders:  Orders Placed This Encounter  Procedures   DG PAIN CLINIC C-ARM 1-60 MIN NO REPORT    Intraoperative interpretation by procedural physician at [facility].    Standing Status:   Standing    Number of Occurrences:   1    Order Specific Question:   Reason for exam:    Answer:   Assistance in needle guidance and placement for procedures requiring needle placement in or near specific anatomical locations not easily accessible without such assistance.   Chronic Opioid Analgesic:  Tramadol 100 mg twice daily, quantity 120/month MME equals 20   Medications ordered for procedure: Meds ordered this encounter  Medications   iohexol (OMNIPAQUE) 180 MG/ML injection 10 mL    Must be Myelogram-compatible. If not available, you may substitute with a water-soluble, non-ionic, hypoallergenic, myelogram-compatible radiological contrast medium.   lidocaine (XYLOCAINE) 2 % (with pres) injection 400 mg   dexamethasone (DECADRON) injection 10 mg   methylPREDNISolone acetate (DEPO-MEDROL) injection 40 mg   ropivacaine (PF) 2 mg/mL (0.2%) (NAROPIN) injection 9 mL   Medications administered: We administered iohexol, lidocaine,  dexamethasone, methylPREDNISolone acetate, and ropivacaine (PF) 2 mg/mL (0.2%).  See the medical record for exact dosing, route, and time of administration.  Follow-up plan:   Return in about 3 weeks (around 10/03/2021) for Post Procedure Evaluation, virtual.       Status post right L5-S1 ESI 03/23/2019, 6 out of 8 cc injected, #2 on 04/21/19, 5 cc more concentrated, #3 on 06/14/2019: 6 cc, #4 09/29/19 left L5/S1 IL , #5 12/08/19, #6 02/23/20. [employer] SCS trial 06/26/20 significant functional and analgesic benefit.  Patient ambulated during clinic which she has never done before.  Plan for implant with Dr Lacinda Axon              Recent Visits Date Type Provider Dept  09/04/21 Office Visit [name], MD [facility]  07/17/21 Office Visit [name], MD [facility]  06/25/21 Procedure visit [name], MD [facility]  Showing recent visits within past 90 days and meeting all other requirements Today's Visits Date Type Provider Dept  09/12/21 Procedure visit [name], MD [facility]  Showing today's visits and meeting all other requirements Future Appointments Date Type Provider Dept  10/02/21 Appointment [name], MD [facility]  10/25/21 Appointment [name], MD [facility]  Showing future appointments within next 90 days and meeting all other requirements  Disposition: Discharge home  Discharge (Date   Time): 09/12/2021; 1213 hrs.   Primary Care Physician: Kirk Ruths, MD Location: [facility] Note by: [name], MD Date: 09/12/2021; Time: 1:53 PM  Disclaimer:  Medicine is not an exact science. The only guarantee in medicine is that nothing is guaranteed. It is important to note that the decision to proceed with this intervention was based on the information collected from the patient. The Data and conclusions were drawn from the patient's questionnaire, the  interview, and the physical examination. Because the information was provided in large part by the patient, it cannot be guaranteed that it has not been purposely or unconsciously manipulated. Every effort has been made to obtain as much relevant data as possible for this evaluation. It is important to note that the conclusions that lead to  this procedure are derived in large part from the available data. Always take into account that the treatment will also be dependent on availability of resources and existing treatment guidelines, considered by other Pain Management Practitioners as being common knowledge and practice, at the time of the intervention. For Medico-Legal purposes, it is also important to point out that variation in procedural techniques and pharmacological choices are the acceptable norm. The indications, contraindications, technique, and results of the above procedure should only be interpreted and judged by a Board-Certified Interventional Pain Specialist with extensive familiarity and expertise in the same exact procedure and technique.

## 2021-09-13 ENCOUNTER — Telehealth: Payer: Self-pay

## 2021-09-13 NOTE — Telephone Encounter (Signed)
Post procedure phone call.  Patient states she is doing well.  She has not gotten up yet but will call us if needed.

## 2021-09-18 DIAGNOSIS — I700 Atherosclerosis of aorta: Secondary | ICD-10-CM | POA: Diagnosis not present

## 2021-09-18 DIAGNOSIS — K640 First degree hemorrhoids: Secondary | ICD-10-CM | POA: Diagnosis not present

## 2021-09-18 DIAGNOSIS — K2100 Gastro-esophageal reflux disease with esophagitis, without bleeding: Secondary | ICD-10-CM | POA: Diagnosis not present

## 2021-09-18 DIAGNOSIS — Z6835 Body mass index (BMI) 35.0-35.9, adult: Secondary | ICD-10-CM | POA: Diagnosis not present

## 2021-09-18 DIAGNOSIS — E118 Type 2 diabetes mellitus with unspecified complications: Secondary | ICD-10-CM | POA: Diagnosis not present

## 2021-09-18 DIAGNOSIS — K581 Irritable bowel syndrome with constipation: Secondary | ICD-10-CM | POA: Diagnosis not present

## 2021-09-18 DIAGNOSIS — E0500 Thyrotoxicosis with diffuse goiter without thyrotoxic crisis or storm: Secondary | ICD-10-CM | POA: Diagnosis not present

## 2021-09-18 DIAGNOSIS — Z96651 Presence of right artificial knee joint: Secondary | ICD-10-CM | POA: Diagnosis not present

## 2021-09-26 DIAGNOSIS — E042 Nontoxic multinodular goiter: Secondary | ICD-10-CM | POA: Diagnosis not present

## 2021-09-26 DIAGNOSIS — E0500 Thyrotoxicosis with diffuse goiter without thyrotoxic crisis or storm: Secondary | ICD-10-CM | POA: Diagnosis not present

## 2021-09-26 DIAGNOSIS — E1169 Type 2 diabetes mellitus with other specified complication: Secondary | ICD-10-CM | POA: Diagnosis not present

## 2021-09-26 DIAGNOSIS — E669 Obesity, unspecified: Secondary | ICD-10-CM | POA: Diagnosis not present

## 2021-09-26 DIAGNOSIS — I10 Essential (primary) hypertension: Secondary | ICD-10-CM | POA: Diagnosis not present

## 2021-09-27 ENCOUNTER — Encounter: Payer: Self-pay | Admitting: Student in an Organized Health Care Education/Training Program

## 2021-09-27 ENCOUNTER — Telehealth: Payer: Self-pay | Admitting: Student in an Organized Health Care Education/Training Program

## 2021-09-27 NOTE — Telephone Encounter (Signed)
X-ray results read to patient. 

## 2021-09-27 NOTE — Telephone Encounter (Signed)
Would like results of xrays.

## 2021-10-01 IMAGING — CT CT ABD-PELV W/ CM
2 of 5 series · 16 of 46 positions shown, 18 images · IV contrast (omnipaque)
Comparison: 12/25/2018

CLINICAL DATA: Right lower quadrant pain radiating to right groin
for 1 year, no known injury, status post right hip replacement,
tubal ligation

EXAM:
CT ABDOMEN AND PELVIS WITH CONTRAST
TECHNIQUE: Multidetector CT imaging of the abdomen and pelvis was performed
using the standard protocol following bolus administration of
intravenous contrast.
CONTRAST:  100mL OMNIPAQUE IOHEXOL 300 MG/ML SOLN, additional oral
enteric contrast

[Series 2: abd pelvis (person_name) 5.00 · axial · 0.75mm/px · z∈[-1523,-1088]mm · 13 of 99 slices shown, 15 images]
[im 6/99  soft-tissue]
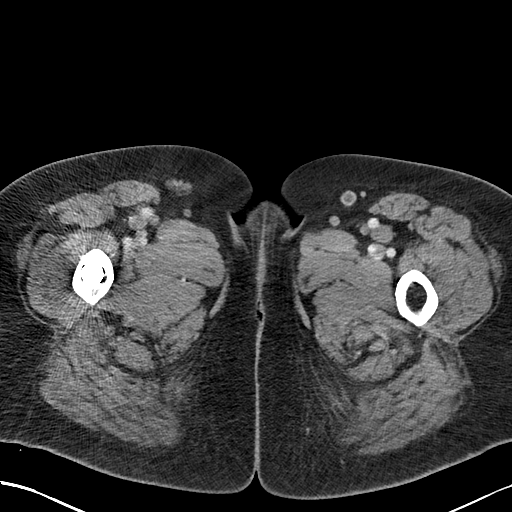
[im 6/99  bone]
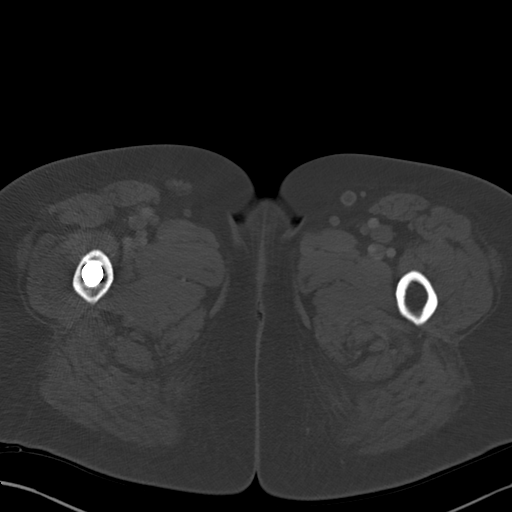
[im 16/99  soft-tissue]
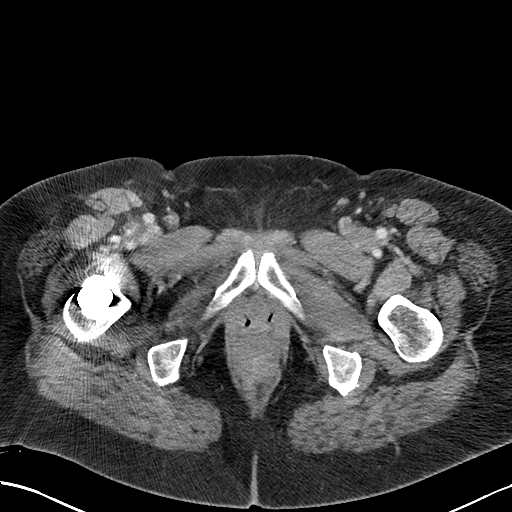
[im 21/99  soft-tissue]
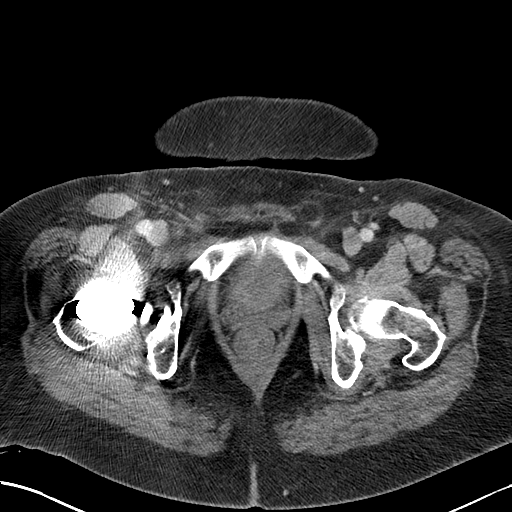
[im 26/99  soft-tissue]
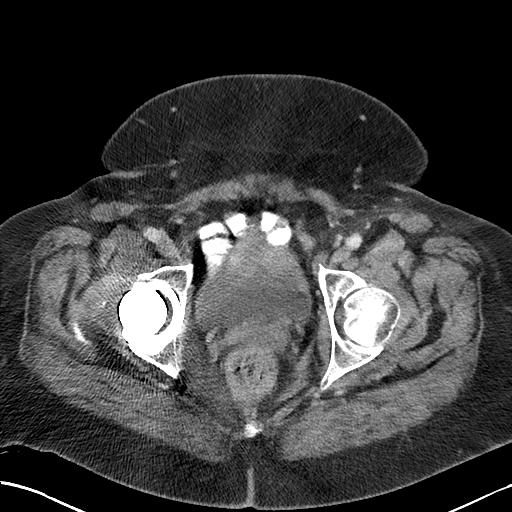
[im 37/99  soft-tissue]
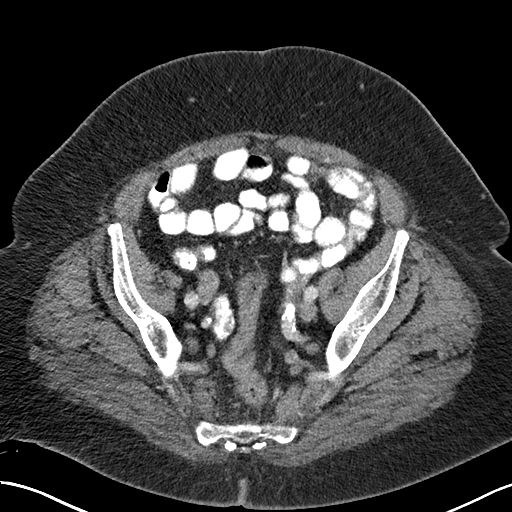
[im 42/99  soft-tissue]
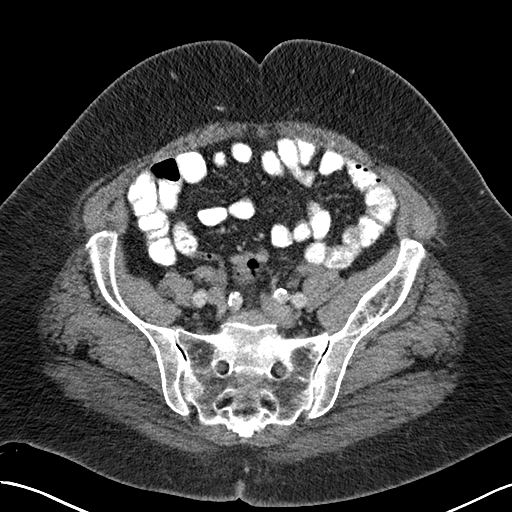
[im 52/99  soft-tissue]
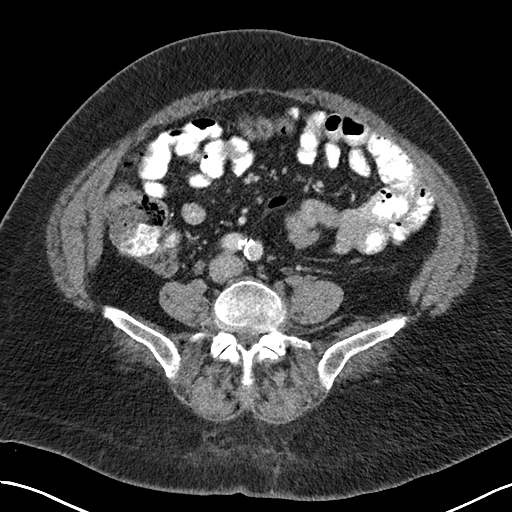
[im 57/99  soft-tissue]
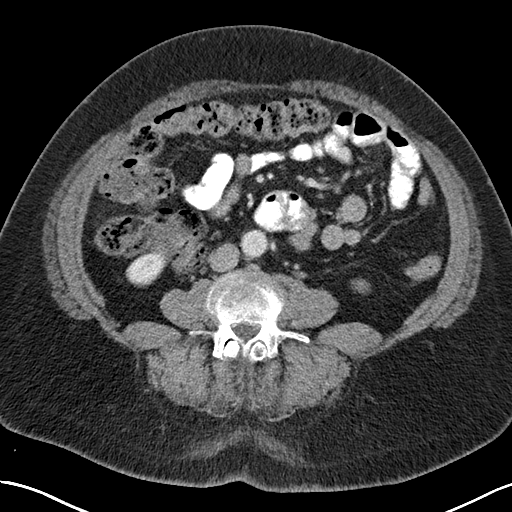
[im 62/99  soft-tissue]
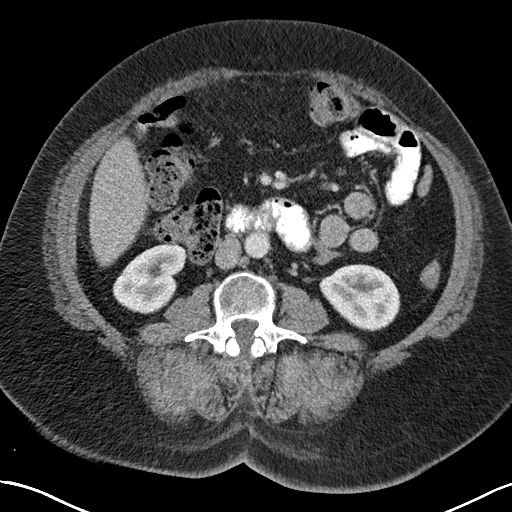
[im 62/99  bone]
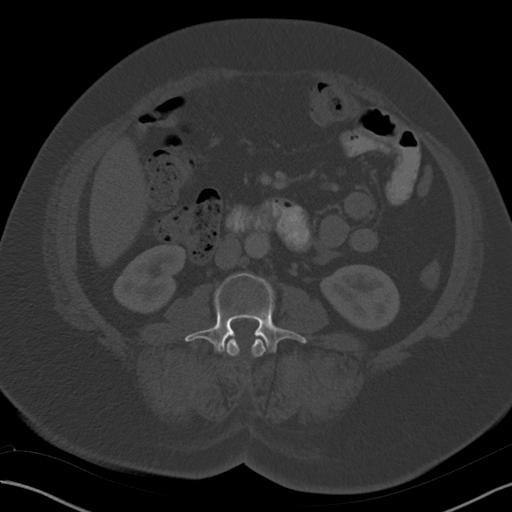
[im 73/99  soft-tissue]
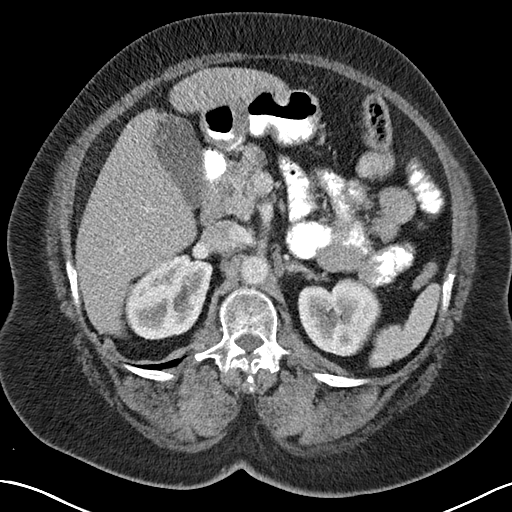
[im 78/99  soft-tissue]
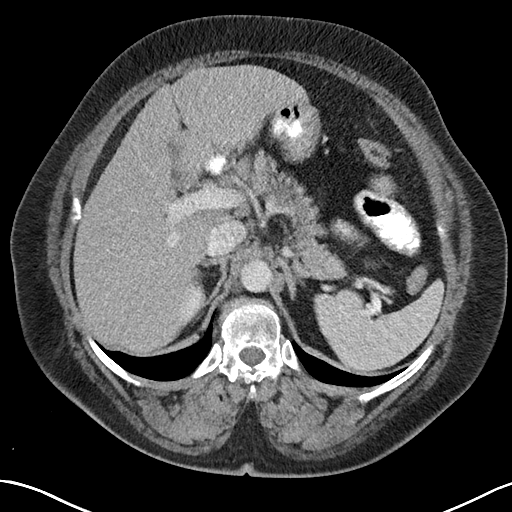
[im 83/99  soft-tissue]
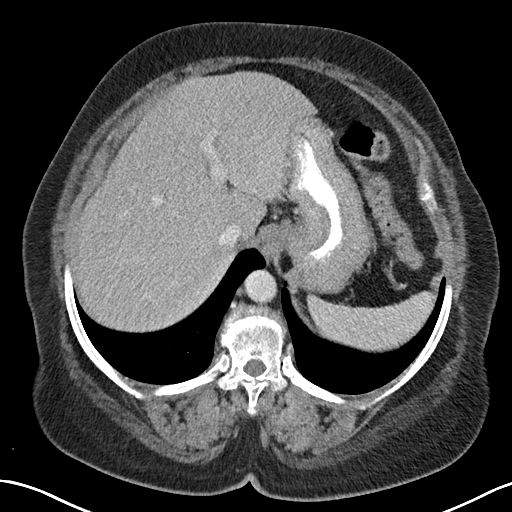
[im 93/99  soft-tissue]
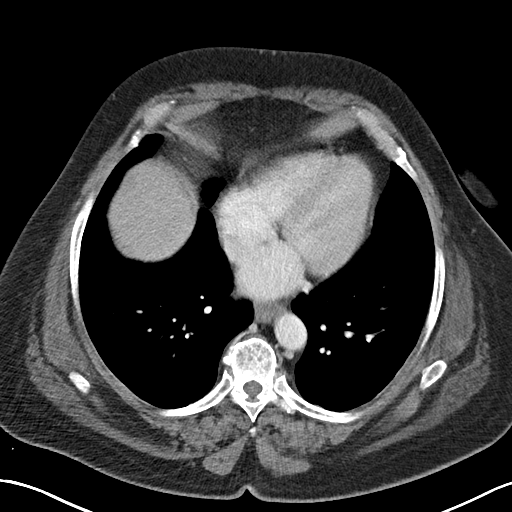

[Series 4: coronals abd pelvis (person_name) 2.00 cor · coronal · 0.75mm/px · 3 of 190 slices shown]
[im 64/190  soft-tissue]
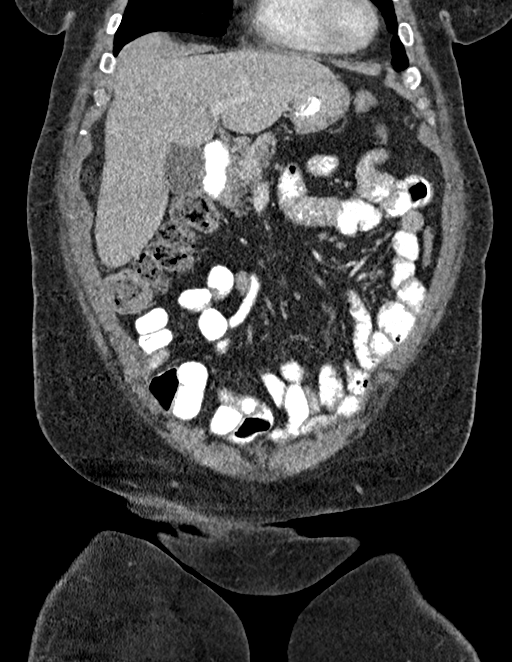
[im 85/190  soft-tissue]
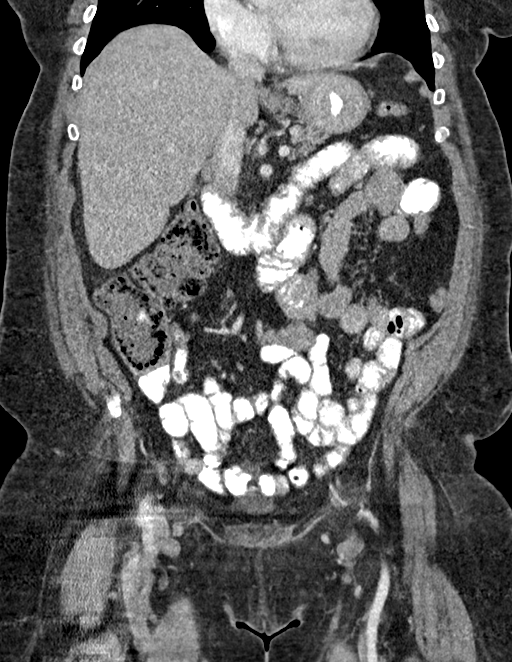
[im 106/190  soft-tissue]
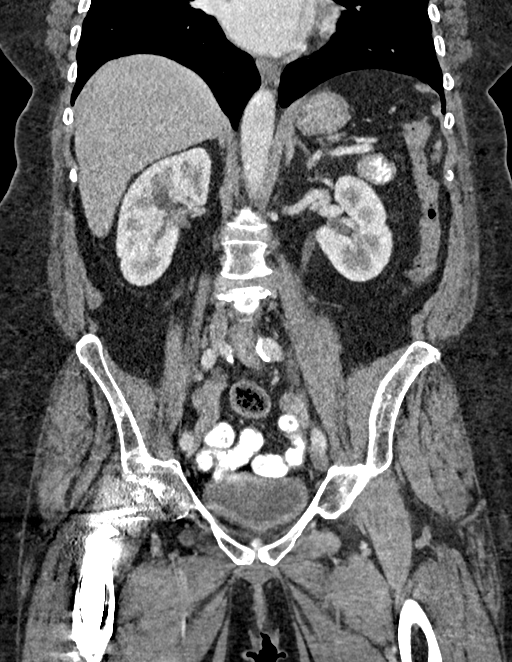

[16 of 46 positions shown; findings below may reference images not displayed]

FINDINGS: Lower chest: No acute abnormality.

Hepatobiliary: No solid liver abnormality is seen. No gallstones,
gallbladder wall thickening, or biliary dilatation.

Pancreas: Unremarkable. No pancreatic ductal dilatation or
surrounding inflammatory changes.

Spleen: Normal in size without significant abnormality.

Adrenals/Urinary Tract: Adrenal glands are unremarkable. Kidneys are
normal, without renal calculi, solid lesion, or hydronephrosis.
Bladder is unremarkable.

Stomach/Bowel: Stomach is within normal limits. Appendix appears
normal. No evidence of bowel wall thickening, distention, or
inflammatory changes.

Vascular/Lymphatic: Aortic atherosclerosis. No enlarged abdominal or
pelvic lymph nodes.

Reproductive: No mass or other significant abnormality.

Other: No abdominal wall hernia or abnormality. No abdominopelvic
ascites.

Musculoskeletal: No acute or significant osseous findings. Status
post right hip total arthroplasty.
IMPRESSION: No CT findings of the abdomen or pelvis to explain right lower
quadrant pain. Normal appendix.

Aortic Atherosclerosis ([id number]).

## 2021-10-02 ENCOUNTER — Encounter: Payer: Self-pay | Admitting: Student in an Organized Health Care Education/Training Program

## 2021-10-02 ENCOUNTER — Other Ambulatory Visit: Payer: Self-pay

## 2021-10-02 ENCOUNTER — Ambulatory Visit
Payer: Medicare HMO | Attending: Student in an Organized Health Care Education/Training Program | Admitting: Student in an Organized Health Care Education/Training Program

## 2021-10-02 DIAGNOSIS — Z6838 Body mass index (BMI) 38.0-38.9, adult: Secondary | ICD-10-CM | POA: Diagnosis not present

## 2021-10-02 DIAGNOSIS — M5416 Radiculopathy, lumbar region: Secondary | ICD-10-CM | POA: Diagnosis not present

## 2021-10-02 DIAGNOSIS — I10 Essential (primary) hypertension: Secondary | ICD-10-CM | POA: Diagnosis not present

## 2021-10-02 DIAGNOSIS — G894 Chronic pain syndrome: Secondary | ICD-10-CM | POA: Diagnosis not present

## 2021-10-02 DIAGNOSIS — M5136 Other intervertebral disc degeneration, lumbar region: Secondary | ICD-10-CM | POA: Diagnosis not present

## 2021-10-02 DIAGNOSIS — F325 Major depressive disorder, single episode, in full remission: Secondary | ICD-10-CM | POA: Diagnosis not present

## 2021-10-02 DIAGNOSIS — E119 Type 2 diabetes mellitus without complications: Secondary | ICD-10-CM | POA: Diagnosis not present

## 2021-10-02 DIAGNOSIS — I700 Atherosclerosis of aorta: Secondary | ICD-10-CM | POA: Diagnosis not present

## 2021-10-02 DIAGNOSIS — D709 Neutropenia, unspecified: Secondary | ICD-10-CM | POA: Diagnosis not present

## 2021-10-02 NOTE — Progress Notes (Signed)
° °  Today, she is being contacted for a post-procedure assessment.  I attempted to call the patient however no response. Voicemail left instructing patient to call front desk office at [phone] to reschedule appointment. -Dr Holley Raring    Post-procedure evaluation     Procedure:          Anesthesia, Analgesia, Anxiolysis:  Type: LEFT Hip bursa injection  and right Piriformis TPI   Primary Purpose: Diagnostic/Therapeutric Region: left posterior Upper (proximal) Femoral Region  Target Area: Trochanteric Bursa (left), right piriformis Approach: Posterior approach   Type: Local Anesthesia Local Anesthetic: Lidocaine 1-2% Sedation: None  Indication(s):  Analgesia Route: Infiltration ([place]/IM) IV Access: N/A   Position: Prone   1. Piriformis syndrome of both sides   2. Primary osteoarthritis of left hip   3. Chronic pain syndrome    NAS-11 Pain score:   Pre-procedure: 8 /10   Post-procedure: 1  (1/10 Hip. 3/10 Piriformis)/10      Effectiveness:  Initial hour after procedure: 90 %  Subsequent 4-6 hours post-procedure: 90 %  Analgesia past initial 6 hours: 0 %

## 2021-10-04 ENCOUNTER — Other Ambulatory Visit: Payer: Self-pay

## 2021-10-04 ENCOUNTER — Ambulatory Visit
Payer: Medicare HMO | Attending: Student in an Organized Health Care Education/Training Program | Admitting: Student in an Organized Health Care Education/Training Program

## 2021-10-04 DIAGNOSIS — Z9889 Other specified postprocedural states: Secondary | ICD-10-CM

## 2021-10-04 DIAGNOSIS — G5703 Lesion of sciatic nerve, bilateral lower limbs: Secondary | ICD-10-CM

## 2021-10-04 DIAGNOSIS — M461 Sacroiliitis, not elsewhere classified: Secondary | ICD-10-CM

## 2021-10-04 DIAGNOSIS — G894 Chronic pain syndrome: Secondary | ICD-10-CM

## 2021-10-04 DIAGNOSIS — M47818 Spondylosis without myelopathy or radiculopathy, sacral and sacrococcygeal region: Secondary | ICD-10-CM

## 2021-10-04 DIAGNOSIS — M1612 Unilateral primary osteoarthritis, left hip: Secondary | ICD-10-CM

## 2021-10-04 DIAGNOSIS — M961 Postlaminectomy syndrome, not elsewhere classified: Secondary | ICD-10-CM

## 2021-10-04 NOTE — Progress Notes (Signed)
I attempted to call the patient however no response.  Of note, patient was seen and evaluated by neurosurgery on 10/02/2021.  At that time MRI lumbar spine without contrast was ordered.  I would like the patient to have her MRI completed and have her see neurosurgery before she follows up with me again. -Dr Holley Raring

## 2021-10-05 ENCOUNTER — Other Ambulatory Visit: Payer: Self-pay | Admitting: Neurosurgery

## 2021-10-05 DIAGNOSIS — M5136 Other intervertebral disc degeneration, lumbar region: Secondary | ICD-10-CM

## 2021-10-05 DIAGNOSIS — M5416 Radiculopathy, lumbar region: Secondary | ICD-10-CM

## 2021-10-05 DIAGNOSIS — G894 Chronic pain syndrome: Secondary | ICD-10-CM

## 2021-10-08 IMAGING — RF DG THORACIC SPINE 2V
1 series · 5 of 5 positions shown · non-contrast
Comparison: None.

CLINICAL DATA: Thoracic stimulator device placement.

EXAM:
THORACIC SPINE 2 VIEWS; DG C-ARM 1-60 MIN

[Series 1: dg x-ray · 0.20mm/px · 5 of 5 slices shown]
[im 1/5]
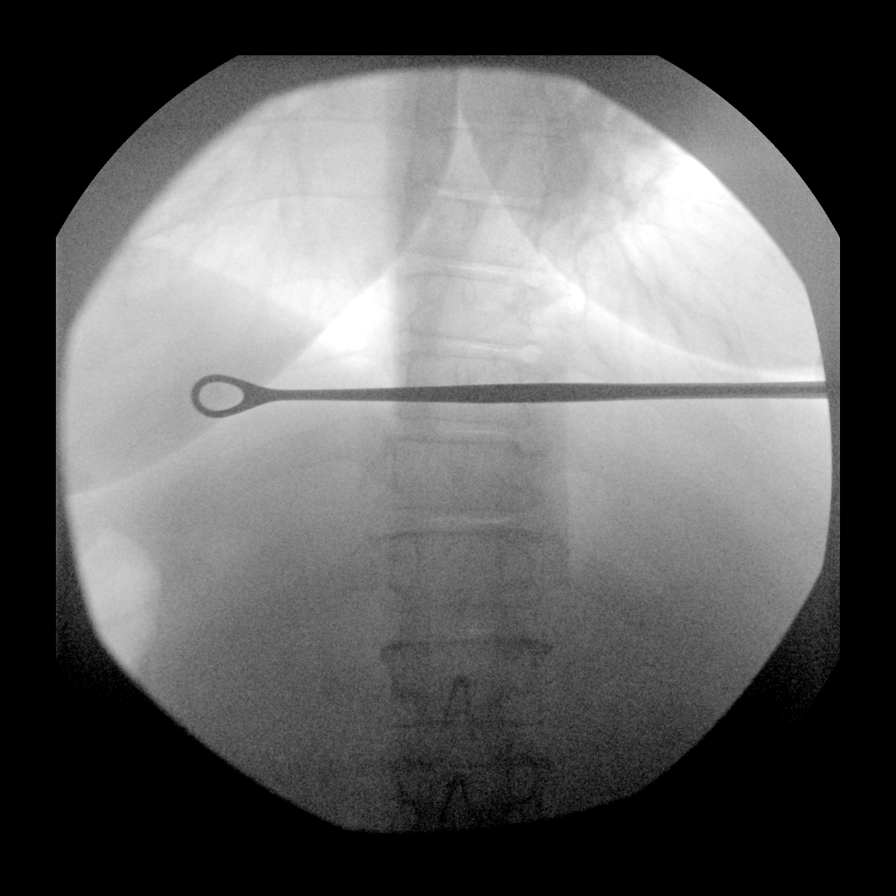
[im 2/5]
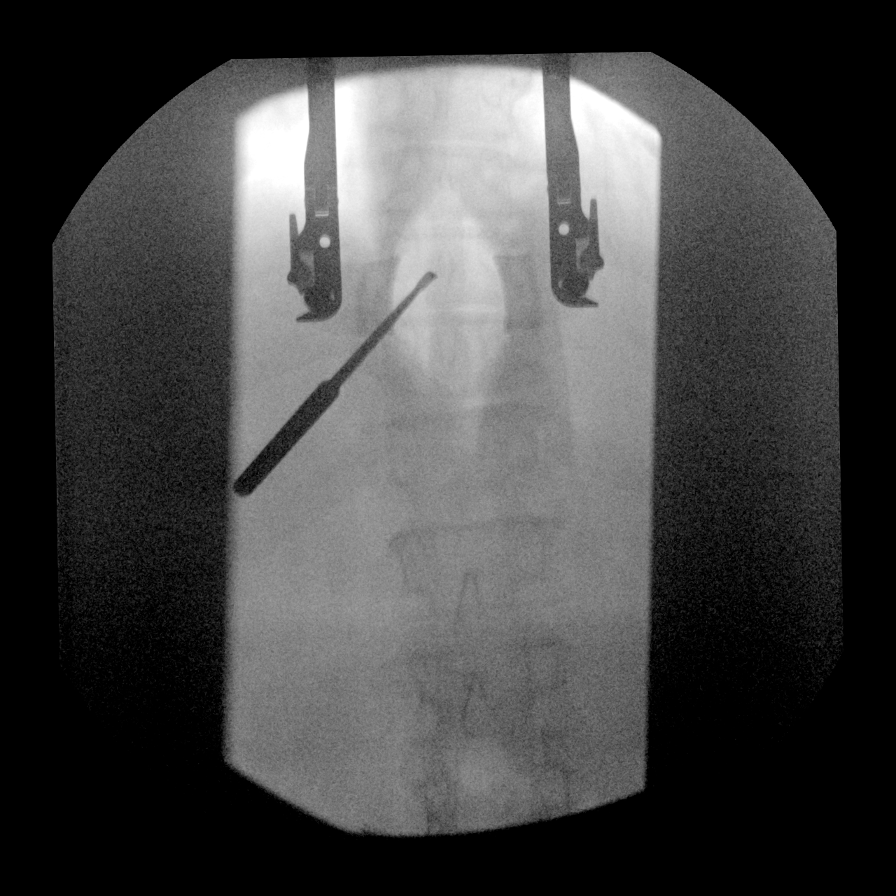
[im 3/5]
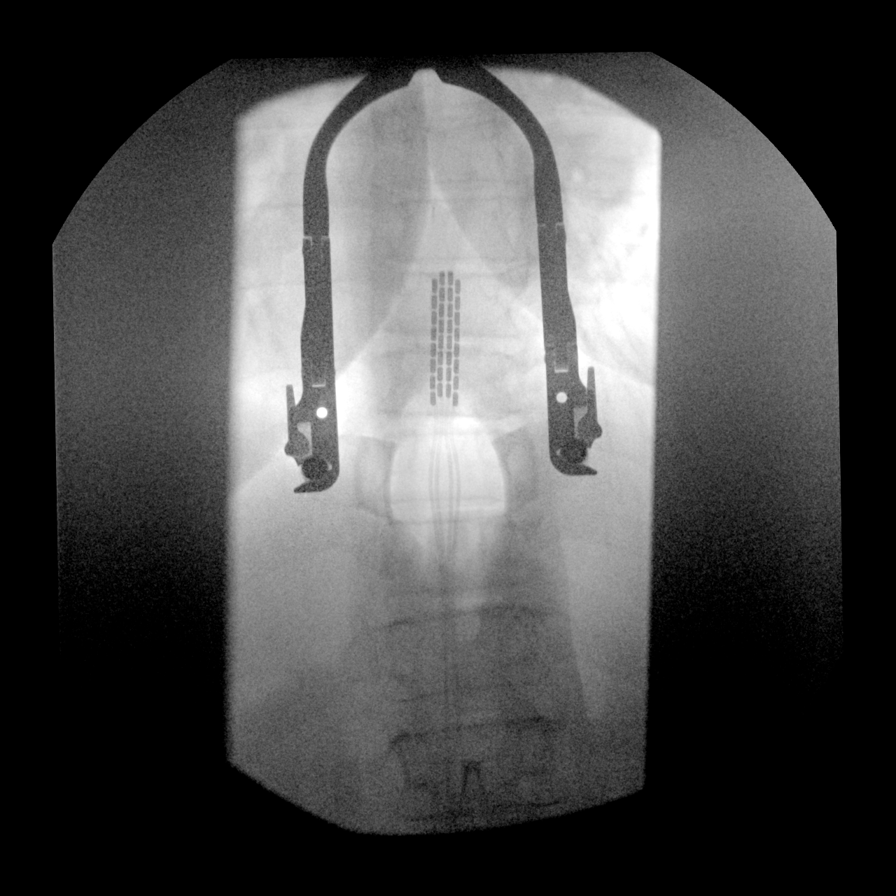
[im 4/5]
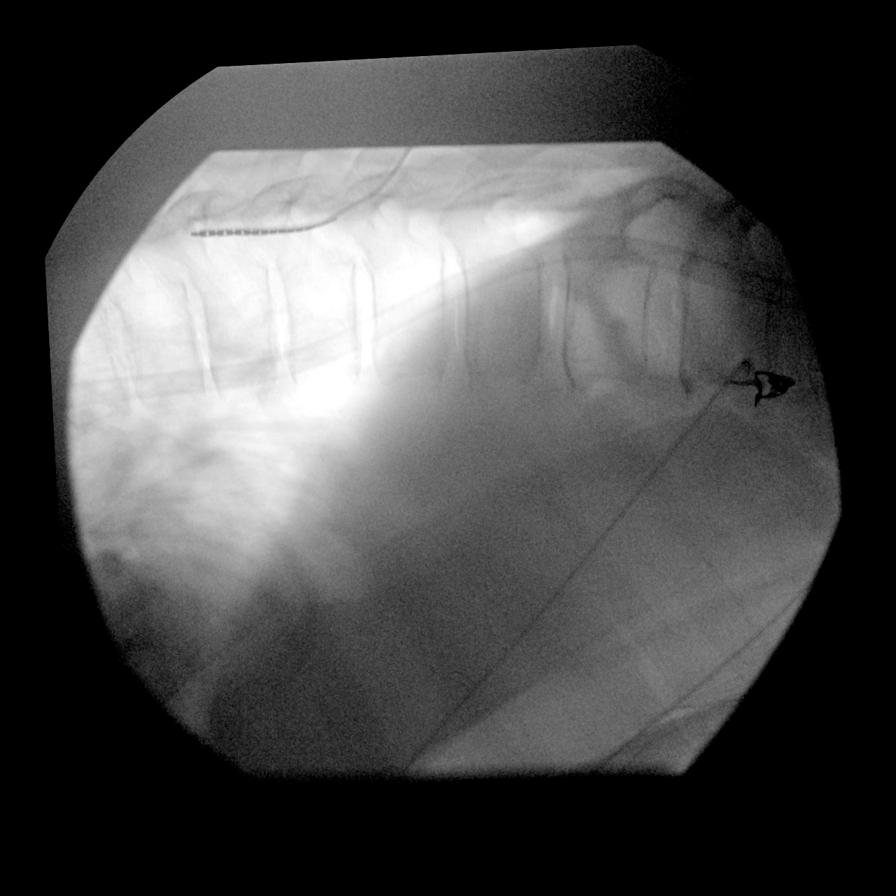
[im 5/5]
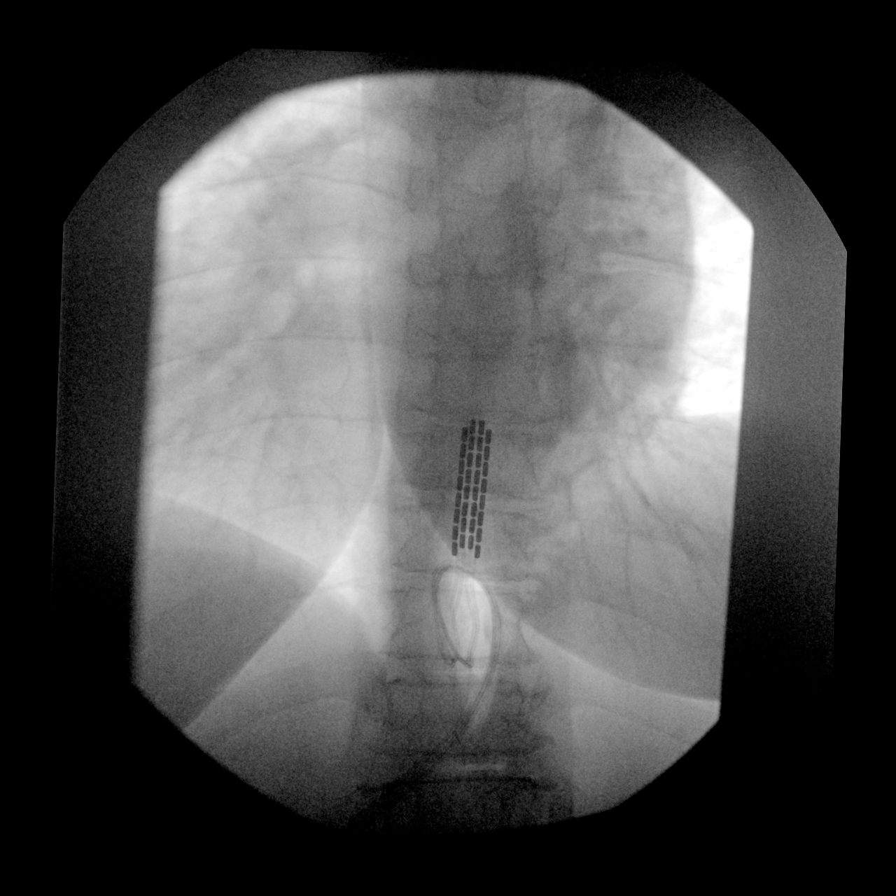

[5 of 5 positions shown; findings below may reference images not displayed]

FINDINGS: 5 intraoperative spot fluoro films obtained during placement of
thoracic spinal stimulator device. Given the small field of view,
level localization is suboptimal, but operative site is
approximately the T10 level.
IMPRESSION: Intraoperative assessment during thoracic stimulator placement.

## 2021-10-15 DIAGNOSIS — M7581 Other shoulder lesions, right shoulder: Secondary | ICD-10-CM | POA: Diagnosis not present

## 2021-10-15 DIAGNOSIS — M25511 Pain in right shoulder: Secondary | ICD-10-CM | POA: Diagnosis not present

## 2021-10-15 DIAGNOSIS — M47812 Spondylosis without myelopathy or radiculopathy, cervical region: Secondary | ICD-10-CM | POA: Diagnosis not present

## 2021-10-16 DIAGNOSIS — R1314 Dysphagia, pharyngoesophageal phase: Secondary | ICD-10-CM | POA: Diagnosis not present

## 2021-10-16 DIAGNOSIS — K219 Gastro-esophageal reflux disease without esophagitis: Secondary | ICD-10-CM | POA: Diagnosis not present

## 2021-10-16 DIAGNOSIS — R221 Localized swelling, mass and lump, neck: Secondary | ICD-10-CM | POA: Diagnosis not present

## 2021-10-17 ENCOUNTER — Other Ambulatory Visit: Payer: Self-pay | Admitting: Otolaryngology

## 2021-10-17 DIAGNOSIS — M542 Cervicalgia: Secondary | ICD-10-CM

## 2021-10-17 DIAGNOSIS — R1310 Dysphagia, unspecified: Secondary | ICD-10-CM

## 2021-10-18 DIAGNOSIS — J310 Chronic rhinitis: Secondary | ICD-10-CM | POA: Diagnosis not present

## 2021-10-18 DIAGNOSIS — J449 Chronic obstructive pulmonary disease, unspecified: Secondary | ICD-10-CM | POA: Diagnosis not present

## 2021-10-18 DIAGNOSIS — R0609 Other forms of dyspnea: Secondary | ICD-10-CM | POA: Diagnosis not present

## 2021-10-19 ENCOUNTER — Other Ambulatory Visit: Payer: Self-pay

## 2021-10-19 ENCOUNTER — Ambulatory Visit
Admission: RE | Admit: 2021-10-19 | Discharge: 2021-10-19 | Disposition: A | Discharge status: Home / Self Care | Payer: Medicare HMO | Source: Ambulatory Visit | Attending: Neurosurgery | Admitting: Neurosurgery

## 2021-10-19 DIAGNOSIS — M79604 Pain in right leg: Secondary | ICD-10-CM | POA: Diagnosis not present

## 2021-10-19 DIAGNOSIS — M5416 Radiculopathy, lumbar region: Secondary | ICD-10-CM | POA: Diagnosis not present

## 2021-10-19 DIAGNOSIS — G894 Chronic pain syndrome: Secondary | ICD-10-CM | POA: Diagnosis not present

## 2021-10-19 DIAGNOSIS — M5136 Other intervertebral disc degeneration, lumbar region: Secondary | ICD-10-CM | POA: Insufficient documentation

## 2021-10-19 DIAGNOSIS — M545 Low back pain, unspecified: Secondary | ICD-10-CM | POA: Diagnosis not present

## 2021-10-19 DIAGNOSIS — R200 Anesthesia of skin: Secondary | ICD-10-CM | POA: Diagnosis not present

## 2021-10-24 ENCOUNTER — Other Ambulatory Visit: Payer: Medicare HMO

## 2021-10-25 ENCOUNTER — Encounter: Payer: Medicare HMO | Admitting: Student in an Organized Health Care Education/Training Program

## 2021-10-25 ENCOUNTER — Other Ambulatory Visit: Payer: Medicare HMO

## 2021-10-31 ENCOUNTER — Ambulatory Visit: Payer: Medicare HMO | Admitting: Dermatology

## 2021-11-06 ENCOUNTER — Ambulatory Visit
Admission: RE | Admit: 2021-11-06 | Discharge: 2021-11-06 | Disposition: A | Discharge status: Home / Self Care | Payer: Medicare HMO | Source: Ambulatory Visit | Attending: Otolaryngology | Admitting: Otolaryngology

## 2021-11-06 ENCOUNTER — Encounter: Payer: Medicare HMO | Admitting: Student in an Organized Health Care Education/Training Program

## 2021-11-06 DIAGNOSIS — M542 Cervicalgia: Secondary | ICD-10-CM

## 2021-11-06 DIAGNOSIS — R221 Localized swelling, mass and lump, neck: Secondary | ICD-10-CM | POA: Diagnosis not present

## 2021-11-06 DIAGNOSIS — R1310 Dysphagia, unspecified: Secondary | ICD-10-CM

## 2021-11-06 MED ORDER — IOPAMIDOL (ISOVUE-300) INJECTION 61%
75.0000 mL | Freq: Once | INTRAVENOUS | Status: AC | PRN
  Administered 2021-11-06: 75 mL via INTRAVENOUS

## 2021-11-12 ENCOUNTER — Other Ambulatory Visit: Payer: Self-pay | Admitting: Student in an Organized Health Care Education/Training Program

## 2021-11-13 ENCOUNTER — Ambulatory Visit (INDEPENDENT_AMBULATORY_CARE_PROVIDER_SITE_OTHER): Payer: Medicare HMO | Admitting: Dermatology

## 2021-11-13 ENCOUNTER — Other Ambulatory Visit: Payer: Self-pay

## 2021-11-13 DIAGNOSIS — B351 Tinea unguium: Secondary | ICD-10-CM

## 2021-11-13 DIAGNOSIS — L309 Dermatitis, unspecified: Secondary | ICD-10-CM

## 2021-11-13 DIAGNOSIS — L299 Pruritus, unspecified: Secondary | ICD-10-CM

## 2021-11-13 DIAGNOSIS — Z7189 Other specified counseling: Secondary | ICD-10-CM | POA: Diagnosis not present

## 2021-11-13 DIAGNOSIS — L219 Seborrheic dermatitis, unspecified: Secondary | ICD-10-CM | POA: Diagnosis not present

## 2021-11-13 MED ORDER — CLOBETASOL PROPIONATE 0.05 % EX SOLN: CUTANEOUS | 1 refills | Status: DC

## 2021-11-13 MED ORDER — TRIAMCINOLONE ACETONIDE 0.1 % EX CREA: TOPICAL_CREAM | CUTANEOUS | 1 refills | Status: DC

## 2021-11-13 MED ORDER — CICLOPIROX OLAMINE 0.77 % EX CREA: TOPICAL_CREAM | CUTANEOUS | 1 refills | Status: DC

## 2021-11-13 MED ORDER — TERBINAFINE HCL 250 MG PO TABS: 250.0000 mg | ORAL_TABLET | Freq: Every day | ORAL | 0 refills | Status: DC

## 2021-11-13 MED ORDER — KETOCONAZOLE 2 % EX SHAM: MEDICATED_SHAMPOO | CUTANEOUS | 1 refills | Status: DC

## 2021-11-13 NOTE — Patient Instructions (Addendum)
[email or web address] ? ?Terbinafine Counseling ? ?Terbinafine is an anti-fungal medicine that can be applied to the skin (over the counter) or taken by mouth (prescription) to treat fungal infections. The pill version is often used to treat fungal infections of the nails or scalp. While most people do not have any side effects from taking terbinafine pills, some possible side effects of the medicine can include taste changes, headache, loss of smell, vision changes, nausea, vomiting, or diarrhea.  ? ?Rare side effects can include irritation of the liver, allergic reaction, or decrease in blood counts (which may show up as not feeling well or developing an infection). If you are concerned about any of these side effects, please stop the medicine and call your doctor, or in the case of an emergency such as feeling very unwell, seek immediate medical care.  ? ?Topical steroids (such as triamcinolone, fluocinolone, fluocinonide, mometasone, clobetasol, halobetasol, betamethasone, hydrocortisone) can cause thinning and lightening of the skin if they are used for too long in the same area. Your physician has selected the right strength medicine for your problem and area affected on the body. Please use your medication only as directed by your physician to prevent side effects.  ? ?If You Need Anything After Your Visit ? ?If you have any questions or concerns for your doctor, please call our main line at [phone] and press option 4 to reach your doctor's medical assistant. If no one answers, please leave a voicemail as directed and we will return your call as soon as possible. Messages left after 4 pm will be answered the following business day.  ? ?You may also send [place] a message via MyChart. We typically respond to MyChart messages within 1-2 business days. ? ?For prescription refills, please ask your pharmacy to contact our office. Our fax number is [phone]. ? ?If you have an urgent issue when the clinic is  closed that cannot wait until the next business day, you can page your doctor at the number below.   ? ?Please note that while we do our best to be available for urgent issues outside of office hours, we are not available 24/7.  ? ?If you have an urgent issue and are unable to reach [place], you may choose to seek medical care at your doctor's office, retail clinic, urgent care center, or emergency room. ? ?If you have a medical emergency, please immediately call 911 or go to the emergency department. ? ?Pager Numbers ? ?- Dr. Nehemiah Massed: [phone] ? ?- Dr. Laurence Ferrari: [phone] ? ?- Dr. Nicole Kindred: [phone] ? ?In the event of inclement weather, please call our main line at [phone] for an update on the status of any delays or closures. ? ?Dermatology Medication Tips: ?Please keep the boxes that topical medications come in in order to help keep track of the instructions about where and how to use these. Pharmacies typically print the medication instructions only on the boxes and not directly on the medication tubes.  ? ?If your medication is too expensive, please contact our office at [phone] option 4 or send [place] a message through [place].  ? ?We are unable to tell what your co-pay for medications will be in advance as this is different depending on your insurance coverage. However, we may be able to find a substitute medication at lower cost or fill out paperwork to get insurance to cover a needed medication.  ? ?If a prior authorization is required to get your medication covered by your insurance company, please allow  [place] 1-2 business days to complete this process. ? ?Drug prices often vary depending on where the prescription is filled and some pharmacies may offer cheaper prices. ? ?The website www.goodrx.com contains coupons for medications through different pharmacies. The prices here do not account for what the cost may be with help from insurance (it may be cheaper with your insurance), but the website can  give you the price if you did not use any insurance.  ?- You can print the associated coupon and take it with your prescription to the pharmacy.  ?- You may also stop by our office during regular business hours and pick up a GoodRx coupon card.  ?- If you need your prescription sent electronically to a different pharmacy, notify our office through [facility] or by phone at [phone] option 4. ? ? ? ? ?Si Usted Necesita Algo Despu?s de Su Visita ? ?Tambi?n puede enviarnos un mensaje a trav?s de MyChart. Por lo general respondemos a los mensajes de MyChart en el transcurso de 1 a 2 d?as h?biles. ? ?Para renovar recetas, por favor pida a su farmacia que se ponga en contacto con nuestra oficina. Nuestro n?mero de fax es el [phone]. ? ?Si tiene un asunto urgente cuando la cl?nica est? cerrada y que no puede esperar hasta el siguiente d?a h?bil, puede llamar/localizar a su doctor(a) al n?mero que aparece a continuaci?n.  ? ?Por favor, tenga en cuenta que aunque hacemos todo lo posible para estar disponibles para asuntos urgentes fuera del horario de oficina, no estamos disponibles las 24 horas del d?a, los 7 d?as de la semana.  ? ?Si tiene un problema urgente y no puede comunicarse con nosotros, puede optar por buscar atenci?n m?dica  en el consultorio de su doctor(a), en una cl?nica privada, en un centro de atenci?n urgente o en una sala de emergencias. ? ?Si tiene Engineer, maintenance (IT) m?dica, por favor llame inmediatamente al 911 o vaya a la sala de emergencias. ? ?N?meros de b?per ? ?- Dr. Nehemiah Massed: [phone] ? ?- Dra. Moye: [phone] ? ?- Dra. Nicole Kindred: [phone] ? ?En caso de inclemencias del tiempo, por favor llame a nuestra l?nea principal al [phone] para una actualizaci?n sobre el estado de cualquier retraso o cierre. ? ?Consejos para la medicaci?n en dermatolog?a: ?Por favor, guarde las cajas en las que vienen los medicamentos de uso t?pico para ayudarle a seguir las instrucciones sobre  d?nde y c?mo usarlos. Las farmacias generalmente imprimen las instrucciones del medicamento s?lo en las cajas y no directamente en los tubos del [place].  ? ?Si su medicamento es muy caro, por favor, p?ngase en contacto con Zigmund Daniel llamando al [phone] y presione la opci?n 4 o env?enos un mensaje a trav?s de MyChart.  ? ?No podemos decirle cu?l ser? su copago por los medicamentos por adelantado ya que esto es diferente dependiendo de la cobertura de su seguro. Sin embargo, es posible que podamos encontrar un medicamento sustituto a Electrical engineer un formulario para que el seguro cubra el medicamento que se considera necesario.  ? ?Si se requiere Ardelia Mems autorizaci?n previa para que su compa??a de seguros [place] su medicamento, por favor perm?tanos de 1 a 2 d?as h?biles para completar este proceso. ? ?Los precios de los medicamentos var?an con frecuencia dependiendo del Environmental consultant de d?nde se surte la receta y alguna farmacias pueden ofrecer precios m?s baratos. ? ?El sitio web www.goodrx.com tiene cupones para medicamentos de Airline pilot. Los precios aqu? no tienen en cuenta lo que podr?a  costar con la ayuda del seguro (puede ser m?s barato con su seguro), pero el sitio web puede darle el precio si no utiliz? ning?n seguro.  ?- Puede imprimir el cup?n correspondiente y llevarlo con su receta a la farmacia.  ?- Tambi?n puede pasar por nuestra oficina durante el horario de atenci?n regular y recoger una tarjeta de cupones de GoodRx.  ?- Si necesita que su receta se env?e electr?nicamente a Chiropodist, informe a nuestra oficina a trav?s de MyChart de [place] o por tel?fono llamando al [phone] y presione la opci?n 4. ? ?

## 2021-11-13 NOTE — Progress Notes (Signed)
? ?Follow-Up Visit ?  ?Subjective  ?Christine Higgins is a 66 y.o. female who presents for the following: Follow-up. ? ?Patient here for follow-up onychomycosis of the toenails. She has taken terbinafine pulse dose in the past. She has noticed some clearing at the base. She also has rash on abdomen, which has cleared with clobetasol ointment in the past. Patient has itchy scalp and has used clobetasol solution and Derma-Smoothe oil in the past, oil didn't help much.  ? ?The following portions of the chart were reviewed this encounter and updated as appropriate:  ?  ?  ? ?Review of Systems:  No other skin or systemic complaints except as noted in HPI or Assessment and Plan. ? ?Objective  ?Well appearing patient in no apparent distress; mood and affect are within normal limits. ? ?A focused examination was performed including face, scalp, toenails, abdomen. Relevant physical exam findings are noted in the Assessment and Plan. ? ?toenails ?Thickened toenails and subungual debris ? ?upper abdomen ?Pink scaly patch ? ?Scalp ?Erythema of the occipital scalp, bilateral temporal scalp ? ? ? ?Assessment & Plan  ?Onychomycosis ?toenails ? ?Chronic and persistent condition with duration or expected duration over one year. Condition is symptomatic / bothersome to patient. Not to goal. ? ?Labs wnl from 05/22/2021 ? ?Restart terbinafine '250mg'$  take 1 po QD dsp #30 0Rf. Will repeat labs in 1 month.  ?Photos not taken today, will take on f/up ? ?Start Ciclopirox cream Apply to AA feet, between toes, and around toenails once to twice daily. ? ?Terbinafine Counseling ? ?Terbinafine is an anti-fungal medicine that can be applied to the skin (over the counter) or taken by mouth (prescription) to treat fungal infections. The pill version is often used to treat fungal infections of the nails or scalp. While most people do not have any side effects from taking terbinafine pills, some possible side effects of the medicine can include taste  changes, headache, loss of smell, vision changes, nausea, vomiting, or diarrhea.  ? ?Rare side effects can include irritation of the liver, allergic reaction, or decrease in blood counts (which may show up as not feeling well or developing an infection). If you are concerned about any of these side effects, please stop the medicine and call your doctor, or in the case of an emergency such as feeling very unwell, seek immediate medical care.  ? ? ?ciclopirox (LOPROX) 0.77 % cream - toenails ?Apply to feet, between toes, and around toenails once to twice daily. ? ?terbinafine (LAMISIL) 250 MG tablet - toenails ?Take 1 tablet (250 mg total) by mouth daily. Take with food. ? ?Dermatitis ?upper abdomen ? ?Possible Contact Dermatitis ? ?Patient has positive reactions to #35 disperse blue 106 and #36 2 Bromo-2-Nitropropane-1, 3-diol with True Test Patch Test. Handouts given on what to avoid. ?  ?D/c [employer] lotion. Recommend Vanicream products, information given. Start Tide free or All free and clear detergent. No fabric softeners ? ?Start TMC 0.1% Cream Apply to AA rash qd/bid prn flares dsp 80g 1Rf. Avoid face, groin, axilla. ? ?Topical steroids (such as triamcinolone, fluocinolone, fluocinonide, mometasone, clobetasol, halobetasol, betamethasone, hydrocortisone) can cause thinning and lightening of the skin if they are used for too long in the same area. Your physician has selected the right strength medicine for your problem and area affected on the body. Please use your medication only as directed by your physician to prevent side effects.  ? ? ?triamcinolone cream (KENALOG) 0.1 % - upper abdomen ?  Apply to affected area rash on abdomen 1-2 times a day as needed for itch. ? ?Seborrheic dermatitis ?Scalp ? ?With pruritus ? ?Seborrheic Dermatitis  ?-  is a chronic persistent rash characterized by pinkness and scaling most commonly of the mid face but also can occur on the scalp (dandruff), ears; mid chest, mid  back and groin.  It tends to be exacerbated by stress and cooler weather.  People who have neurologic disease may experience new onset or exacerbation of existing seborrheic dermatitis.  The condition is not curable but treatable and can be controlled. ? ?Start ketoconazole 2% shampoo Massage into scalp and let sit 10-15 mins before rinsing. Use 2x/wk.dsp 13m 1Rf. ? ?Start Clobetasol solution Apply 1-2 gtts to AA scalp prn itch dsp 526m1Rf. ? ?Topical steroids (such as triamcinolone, fluocinolone, fluocinonide, mometasone, clobetasol, halobetasol, betamethasone, hydrocortisone) can cause thinning and lightening of the skin if they are used for too long in the same area. Your physician has selected the right strength medicine for your problem and area affected on the body. Please use your medication only as directed by your physician to prevent side effects.  ? ? ?ketoconazole (NIZORAL) 2 % shampoo - Scalp ?Massage into scalp and let sit 10-15 mins before rinsing. Use 2x/week. ? ?clobetasol (TEMOVATE) 0.05 % external solution - Scalp ?Apply 1-2 drops to affected area scalp as needed for itch. Avoid face, groin, axilla. ? ? ?Return in about 1 month (around 12/14/2021) for toenails. ? ?Documentation: I have reviewed the above documentation for accuracy and completeness, and I agree with the above. ? ?TaBrendolyn PattyD  ? ? ?

## 2021-11-14 DIAGNOSIS — L409 Psoriasis, unspecified: Secondary | ICD-10-CM | POA: Diagnosis not present

## 2021-11-14 DIAGNOSIS — M797 Fibromyalgia: Secondary | ICD-10-CM | POA: Diagnosis not present

## 2021-11-14 DIAGNOSIS — Z79899 Other long term (current) drug therapy: Secondary | ICD-10-CM | POA: Diagnosis not present

## 2021-11-14 DIAGNOSIS — M159 Polyosteoarthritis, unspecified: Secondary | ICD-10-CM | POA: Diagnosis not present

## 2021-11-14 DIAGNOSIS — L4050 Arthropathic psoriasis, unspecified: Secondary | ICD-10-CM | POA: Diagnosis not present

## 2021-11-15 ENCOUNTER — Other Ambulatory Visit: Payer: Self-pay

## 2021-11-15 ENCOUNTER — Ambulatory Visit
Payer: Medicare HMO | Attending: Student in an Organized Health Care Education/Training Program | Admitting: Student in an Organized Health Care Education/Training Program

## 2021-11-15 ENCOUNTER — Encounter: Payer: Self-pay | Admitting: Student in an Organized Health Care Education/Training Program

## 2021-11-15 VITALS — BP 160/86 | HR 73 | Temp 97.1°F | Resp 18 | Ht 71.0 in | Wt 270.0 lb

## 2021-11-15 DIAGNOSIS — M5416 Radiculopathy, lumbar region: Secondary | ICD-10-CM | POA: Insufficient documentation

## 2021-11-15 DIAGNOSIS — M545 Low back pain, unspecified: Secondary | ICD-10-CM | POA: Insufficient documentation

## 2021-11-15 DIAGNOSIS — G894 Chronic pain syndrome: Secondary | ICD-10-CM | POA: Insufficient documentation

## 2021-11-15 DIAGNOSIS — Z9889 Other specified postprocedural states: Secondary | ICD-10-CM | POA: Diagnosis not present

## 2021-11-15 DIAGNOSIS — M961 Postlaminectomy syndrome, not elsewhere classified: Secondary | ICD-10-CM | POA: Diagnosis not present

## 2021-11-15 MED ORDER — TRAMADOL HCL 50 MG PO TABS: 50.0000 mg | ORAL_TABLET | Freq: Four times a day (QID) | ORAL | 2 refills | Status: DC | PRN

## 2021-11-15 NOTE — Progress Notes (Signed)
Nursing Pain Medication Assessment:  ?Safety precautions to be maintained throughout the outpatient stay will include: orient to surroundings, keep bed in low position, maintain call bell within reach at all times, provide assistance with transfer out of bed and ambulation.  ?Medication Inspection Compliance: Christine Higgins did not comply with our request to bring her pills to be counted. She was reminded that bringing the medication bottles, even when empty, is a requirement. ? ?Medication: None brought in. ?Pill/Patch Count: None available to be counted. ?Bottle Appearance: No container available. Did not bring bottle(s) to appointment. ?Filled Date: N/A ?Last Medication intake:  Yesterday ?

## 2021-11-15 NOTE — Progress Notes (Signed)
PROVIDER NOTE: Information contained herein reflects review and annotations entered in association with encounter. Interpretation of such information and data should be left to medically-trained personnel. Information provided to patient can be located elsewhere in the medical record under "Patient Instructions". Document created using STT-dictation technology, any transcriptional errors that may result from process are unintentional.  ?  ?Patient: Christine Higgins  Service Category: E/M  Provider: [name], MD  ?DOB: [birth year]  DOS: 11/15/2021  Specialty: Interventional Pain Management  ?MRN: [id number]  Setting: Ambulatory outpatient  PCP: Kirk Ruths, MD  ?Type: Established Patient    Referring Provider: Kirk Ruths, MD  ?Location: Office  Delivery: Face-to-face    ? ?HPI  ?[name], a 66 y.o. year old female, is here today because of her Lumbar radiculopathy [M54.16]. Ms. Mcnicholas primary complain today is Back Pain (low) ?Last encounter: My last encounter with her was on 11/12/2021. ?Pertinent problems: Ms. Millirons has Failed back surgical syndrome; DDD (degenerative disc disease), lumbosacral; Severe obesity (BMI 35.0-35.9 with comorbidity) ([place]); Primary osteoarthritis of right knee; DM II (diabetes mellitus, type II), controlled ([place]); Chronic pain syndrome; Fibromyalgia; and Lumbar radiculopathy on their pertinent problem list. ?Pain Assessment: Severity of Chronic pain is reported as a 8 /10. Location: Back Lower/lateral leg bilaterally. Onset: More than a month ago. Quality: Aching, Radiating, Stabbing, Sharp, Constant. Timing: Constant. Modifying factor(s): lying down, medications, heat, ice, topicals. ?Vitals:  height is '5\' 11"'$  (1.803 m) and weight is 270 lb (122.5 kg). Her temporal temperature is 97.1 ?F (36.2 ?C) (abnormal). Her blood pressure is 160/86 (abnormal) and her pulse is 73. Her respiration is 18 and oxygen saturation is 99%.  ? ?Reason for encounter: medication  management, review MRI, and discuss caudal ? ?Patient presents today for medication management as well as review of her most recent lumbar MRI.  She was evaluated by neurosurgery and surgery was not recommended.  She is instructed to continue with pain management and injections as needed. ? ? ?Pharmacotherapy Assessment  ?Analgesic: Tramadol 100 mg twice daily, quantity 120/month MME equals 20  ? ?Monitoring: ?[place] PMP: PDMP reviewed during this encounter.       ?Pharmacotherapy: No side-effects or adverse reactions reported. ?Compliance: No problems identified. ?Effectiveness: Clinically acceptable. ? ?Hart Rochester, RN  11/15/2021  1:21 PM  Signed ?Nursing Pain Medication Assessment:  ?Safety precautions to be maintained throughout the outpatient stay will include: orient to surroundings, keep bed in low position, maintain call bell within reach at all times, provide assistance with transfer out of bed and ambulation.  ?Medication Inspection Compliance: Ms. Job did not comply with our request to bring her pills to be counted. She was reminded that bringing the medication bottles, even when empty, is a requirement. ? ?Medication: None brought in. ?Pill/Patch Count: None available to be counted. ?Bottle Appearance: No container available. Did not bring bottle(s) to appointment. ?Filled Date: N/A ?Last Medication intake:  Yesterday ?  UDS:  ?Summary  ?Date Value Ref Range Status  ?09/29/2019 Note  Final  ?  Comment:  ?  ==================================================================== ?Compliance Drug Analysis, Ur ?==================================================================== ?Test                             Result       Flag       Units ?Drug Present and Declared for Prescription Verification ?  7-aminoclonazepam  65           EXPECTED   ng/mg creat ?   7-aminoclonazepam is an expected metabolite of clonazepam. Source of ?   clonazepam is a scheduled prescription medication. ?  Tramadol                        >4310        EXPECTED   ng/mg creat ?  O-Desmethyltramadol            >4310        EXPECTED   ng/mg creat ?  N-Desmethyltramadol            3447         EXPECTED   ng/mg creat ?   Source of tramadol is a prescription medication. O-desmethyltramadol ?   and N-desmethyltramadol are expected metabolites of tramadol. ?  Gabapentin                     PRESENT      EXPECTED ?  Cyclobenzaprine                PRESENT      EXPECTED ?  Desmethylcyclobenzaprine       PRESENT      EXPECTED ?   Desmethylcyclobenzaprine is an expected metabolite of ?   cyclobenzaprine. ?  Duloxetine                     PRESENT      EXPECTED ?  Trazodone                      PRESENT      EXPECTED ?  1,3 chlorophenyl piperazine    PRESENT      EXPECTED ?   1,3-chlorophenyl piperazine is an expected metabolite of trazodone. ?  Diphenhydramine                PRESENT      EXPECTED ?  Propranolol                    PRESENT      EXPECTED ?Drug Absent but Declared for Prescription Verification ?  Oxycodone                      Not Detected UNEXPECTED ng/mg creat ?  Acetaminophen                  Not Detected UNEXPECTED ?   Acetaminophen, as indicated in the declared medication list, is not ?   always detected even when used as directed. ?  Hydroxyzine                    Not Detected UNEXPECTED ?==================================================================== ?Test                      Result    Flag   Units      Ref Range ?  Creatinine              116              mg/dL      >=20 ?==================================================================== ?Declared Medications: ? The flagging and interpretation on this report are based on the ? following declared medications.  Unexpected results may arise from ? inaccuracies in the declared medications. ? **Note: The testing scope of this panel includes these  medications: ? Clonazepam ? Cyclobenzaprine (Flexeril) ? Diphenhydramine (Benadryl) ? Duloxetine (Cymbalta) ? Gabapentin  (Neurontin) ? Hydroxyzine ? Oxycodone (Roxicodone) ? Propranolol (Inderal) ? Tramadol (Ultram) ? Trazodone (Desyrel) ? **Note: The testing scope of this panel does not include small to ? moderate amounts of these reported medications: ? Acetaminophen ? **Note: The testing scope of this panel does not include the ? following reported medications: ? Apixaban ? Atorvastatin (Lipitor) ? Betamethasone (Lotrisone) ? Budesonide (Symbicort) ? Calcium ? Ciprofloxacin (Cipro) ? Clobetasol ? Clotrimazole (Lotrisone) ? Esomeprazole (Nexium) ? Eye Drops ? Formoterol (Symbicort) ? Hydralazine (Apresoline) ? Hydrochlorothiazide (Hyzaar) ? Iron ? Losartan (Hyzaar) ? Menthol ? Multivitamin ? Probiotic ? Sodium Chloride ? Sucralfate (Carafate) ? Turmeric ? Ubiquinone (CoQ10) ? Vitamin B12 ? Vitamin C ? Vitamin D3 ? Vitamin E ?==================================================================== ?For clinical consultation, please call [phone]. ?==================================================================== ?  ?  ? ?ROS  ?Constitutional: Denies any fever or chills ?Gastrointestinal: No reported hemesis, hematochezia, vomiting, or acute GI distress ?Musculoskeletal:  Low back, bilateral leg pain, left buttock pain ?Neurological: No reported episodes of acute onset apraxia, aphasia, dysarthria, agnosia, amnesia, paralysis, loss of coordination, or loss of consciousness ? ?Medication Review  ?CoQ-10, DULoxetine, Multiple Vitamin, Probiotic Product, Turmeric, Vitamin B-12, Vitamin D3, Vitamin E, acetaminophen, atorvastatin, budesonide-formoterol, calcium carbonate, celecoxib, ciclopirox, clobetasol, clonazePAM, diphenhydrAMINE, esomeprazole, fexofenadine, fluticasone, gabapentin, hydrALAZINE, ketoconazole, linaclotide, losartan-hydrochlorothiazide, methimazole, methocarbamol, propranolol, sucralfate, terbinafine, traMADol, traZODone, triamcinolone cream, vitamin A, and vitamin C ? ?History Review  ?Allergy: Ms. Blumer is  allergic to cephalexin, ibuprofen, potassium chloride, shellfish allergy, and aspirin. ?Drug: Ms. Bhardwaj  reports that she does not currently use drugs. ?Alcohol:  reports no history of alcohol use. ?Tobacco:  reports th

## 2021-11-15 NOTE — Patient Instructions (Signed)
Pain Management Discharge Instructions  General Discharge Instructions :  If you need to reach your doctor call: Monday-Friday 8:00 am - 4:00 pm at [phone] or toll free [phone].  After clinic hours [phone] to have operator reach doctor.  Bring all of your medication bottles to all your appointments in the pain clinic.  To cancel or reschedule your appointment with Pain Management please remember to call 24 hours in advance to avoid a fee.  Refer to the educational materials which you have been given on: General Risks, I had my Procedure. Discharge Instructions, Post Sedation.  Post Procedure Instructions:  The drugs you were given will stay in your system until tomorrow, so for the next 24 hours you should not drive, make any legal decisions or drink any alcoholic beverages.  You may eat anything you prefer, but it is better to start with liquids then soups and crackers, and gradually work up to solid foods.  Please notify your doctor immediately if you have any unusual bleeding, trouble breathing or pain that is not related to your normal pain.  Depending on the type of procedure that was done, some parts of your body may feel week and/or numb.  This usually clears up by tonight or the next day.  Walk with the use of an assistive device or accompanied by an adult for the 24 hours.  You may use ice on the affected area for the first 24 hours.  Put ice in a Ziploc bag and cover with a towel and place against area 15 minutes on 15 minutes off.  You may switch to heat after 24 hours.Epidural Steroid Injection Patient Information  Description: The epidural space surrounds the nerves as they exit the spinal cord.  In some patients, the nerves can be compressed and inflamed by a bulging disc or a tight spinal canal (spinal stenosis).  By injecting steroids into the epidural space, we can bring irritated nerves into direct contact with a potentially helpful medication.  These  steroids act directly on the irritated nerves and can reduce swelling and inflammation which often leads to decreased pain.  Epidural steroids may be injected anywhere along the spine and from the neck to the low back depending upon the location of your pain.   After numbing the skin with local anesthetic (like Novocaine), a small needle is passed into the epidural space slowly.  You may experience a sensation of pressure while this is being done.  The entire block usually last less than 10 minutes.  Conditions which may be treated by epidural steroids:  Low back and leg pain Neck and arm pain Spinal stenosis Post-laminectomy syndrome Herpes zoster (shingles) pain Pain from compression fractures  Preparation for the injection:  Do not eat any solid food or dairy products within 8 hours of your appointment.  You may drink clear liquids up to 3 hours before appointment.  Clear liquids include water, black coffee, juice or soda.  No milk or cream please. You may take your regular medication, including pain medications, with a sip of water before your appointment  Diabetics should hold regular insulin (if taken separately) and take 1/2 normal NPH dos the morning of the procedure.  Carry some sugar containing items with you to your appointment. A driver must accompany you and be prepared to drive you home after your procedure.  Bring all your current medications with your. An IV may be inserted and sedation may be given at the discretion of the physician.     A blood pressure cuff, EKG and other monitors will often be applied during the procedure.  Some patients may need to have extra oxygen administered for a short period. You will be asked to provide medical information, including your allergies, prior to the procedure.  We must know immediately if you are taking blood thinners (like Coumadin/Warfarin)  Or if you are allergic to IV iodine contrast (dye). We must know if you could possible be  pregnant.  Possible side-effects: Bleeding from needle site Infection (rare, may require surgery) Nerve injury (rare) Numbness & tingling (temporary) Difficulty urinating (rare, temporary) Spinal headache ( a headache worse with upright posture) Light -headedness (temporary) Pain at injection site (several days) Decreased blood pressure (temporary) Weakness in arm/leg (temporary) Pressure sensation in back/neck (temporary)  Call if you experience: Fever/chills associated with headache or increased back/neck pain. Headache worsened by an upright position. New onset weakness or numbness of an extremity below the injection site Hives or difficulty breathing (go to the emergency room) Inflammation or drainage at the infection site Severe back/neck pain Any new symptoms which are concerning to you  Please note:  Although the local anesthetic injected can often make your back or neck feel good for several hours after the injection, the pain will likely return.  It takes 3-7 days for steroids to work in the epidural space.  You may not notice any pain relief for at least that one week.  If effective, we will often do a series of three injections spaced 3-6 weeks apart to maximally decrease your pain.  After the initial series, we generally will wait several months before considering a repeat injection of the same type.  If you have any questions, please call [phone]  [facility] 

## 2021-11-20 ENCOUNTER — Telehealth: Payer: Self-pay | Admitting: Student in an Organized Health Care Education/Training Program

## 2021-11-20 NOTE — Telephone Encounter (Signed)
Patient called and is inquiring if Dr Holley Raring decreased her medication.  Review of the chart shows that he prescribed Tramadol 50 mg q6 hours prn pain. Qty 120.  The Rx that I see prior to that, in December 2022 BL wrote for Tramadol 100 mg every 12 hours qty 120.  Patient reports that taking 50 mg at a time does not help her at all.  She would prefer to take 100 mg bid.  Because she is in assisted living she can not just take them that way she will have to get new instructions.  ?

## 2021-11-20 NOTE — Telephone Encounter (Signed)
Document sent to [facility] to fax [phone] to allow patient to take Tramadol 50 mg sig 100 mg q 12 hours prn severe pain. I will scan document into chart.  ?

## 2021-11-20 NOTE — Telephone Encounter (Signed)
Patient is asking if Dr Holley Raring decreased her medications. Please call patient ?

## 2021-11-22 LAB — TOXASSURE SELECT 13 (MW), URINE

## 2021-11-28 ENCOUNTER — Ambulatory Visit
Admission: RE | Admit: 2021-11-28 | Discharge: 2021-11-28 | Disposition: A | Discharge status: Home / Self Care | Payer: Medicare HMO | Source: Ambulatory Visit | Attending: Student in an Organized Health Care Education/Training Program | Admitting: Student in an Organized Health Care Education/Training Program

## 2021-11-28 ENCOUNTER — Encounter: Payer: Self-pay | Admitting: Student in an Organized Health Care Education/Training Program

## 2021-11-28 ENCOUNTER — Ambulatory Visit (HOSPITAL_BASED_OUTPATIENT_CLINIC_OR_DEPARTMENT_OTHER): Payer: Medicare HMO | Admitting: Student in an Organized Health Care Education/Training Program

## 2021-11-28 VITALS — BP 156/99 | HR 69 | Temp 97.4°F | Resp 16 | Ht 71.0 in | Wt 273.0 lb

## 2021-11-28 DIAGNOSIS — M5416 Radiculopathy, lumbar region: Secondary | ICD-10-CM

## 2021-11-28 DIAGNOSIS — M961 Postlaminectomy syndrome, not elsewhere classified: Secondary | ICD-10-CM | POA: Insufficient documentation

## 2021-11-28 DIAGNOSIS — G894 Chronic pain syndrome: Secondary | ICD-10-CM | POA: Diagnosis not present

## 2021-11-28 MED ORDER — IOHEXOL 180 MG/ML  SOLN
10.0000 mL | Freq: Once | INTRAMUSCULAR | Status: AC
  Administered 2021-11-28: 5 mL via EPIDURAL

## 2021-11-28 MED ORDER — ROPIVACAINE HCL 2 MG/ML IJ SOLN
2.0000 mL | Freq: Once | INTRAMUSCULAR | Status: AC
  Administered 2021-11-28: 2 mL via EPIDURAL
  Filled 2021-11-28: qty 20

## 2021-11-28 MED ORDER — LIDOCAINE HCL 2 % IJ SOLN
20.0000 mL | Freq: Once | INTRAMUSCULAR | Status: AC
  Administered 2021-11-28: 100 mg
  Filled 2021-11-28: qty 40

## 2021-11-28 MED ORDER — DEXAMETHASONE SODIUM PHOSPHATE 10 MG/ML IJ SOLN
10.0000 mg | Freq: Once | INTRAMUSCULAR | Status: AC
  Administered 2021-11-28: 10 mg
  Filled 2021-11-28: qty 1

## 2021-11-28 MED ORDER — SODIUM CHLORIDE 0.9% FLUSH
2.0000 mL | Freq: Once | INTRAVENOUS | Status: AC
  Administered 2021-11-28: 2 mL

## 2021-11-28 NOTE — Progress Notes (Signed)
PROVIDER NOTE: Information contained herein reflects review and annotations entered in association with encounter. Interpretation of such information and data should be left to medically-trained personnel. Information provided to patient can be located elsewhere in the medical record under "Patient Instructions". Document created using STT-dictation technology, any transcriptional errors that may result from process are unintentional.  ?  ?Patient: Christine Higgins  Service Category: Procedure  Provider: [name], MD  ?DOB: 03/31/1956  DOS: 11/28/2021  Location: [facility]  ?MRN: [id number]  Setting: Ambulatory - outpatient  Referring Provider: Kirk Ruths, MD  ?Type: Established Patient  Specialty: Interventional Pain Management  PCP: Kirk Ruths, MD  ? ?Primary Reason for Visit: Interventional Pain Management Treatment. ?CC: Back Pain (low) ? ?Procedure:          Anesthesia, Analgesia, Anxiolysis:  ?Type: Palliative Epidural Steroid Injection          ?Region: Caudal ?Level: Sacrococcygeal   ?Laterality: Midline       Type: Local Anesthesia ? ?Local Anesthetic: Lidocaine 1-2% ? ?Position: Prone  ? ?Indications: ?1. Lumbar radiculopathy   ?2. Failed back surgical syndrome   ?3. Chronic pain syndrome   ? ?Pain Score: ?Pre-procedure: 8 /10 ?Post-procedure: 0-No pain/10  ? ?Pre-op H&P Assessment:  ?Christine Higgins is a 66 y.o. (year old), female patient, seen today for interventional treatment. She  has a past surgical history that includes carpel tunn (Right); Hand surgery (Right); carpel tunnel (Left); Cesarean section; Shoulder surgery (Right); Back surgery; Neck surgery; Total hip arthroplasty (Right); Excision Morton's neuroma (Left, 02/05/2017); Colonoscopy; Joint replacement; Esophagogastroduodenoscopy (egd) with propofol (N/A, 06/26/2017); Knee Arthroplasty (Left, 08/11/2017); Knee arthroscopy (Right, 07/02/2018); Reverse shoulder arthroplasty (Left, 06/24/2019); Thoracic laminectomy  for spinal cord stimulator (N/A, 08/07/2020); Colonoscopy with propofol (N/A, 10/20/2020); Cardiac catheterization; Tubal ligation; Knee Arthroplasty (Right, 02/05/2021); and Spinal cord stimulator removal (N/A, 04/09/2021). Christine Higgins has a current medication list which includes the following prescription(s): acetaminophen, vitamin c, atorvastatin, vitamin a, budesonide-formoterol, calcium carbonate, celecoxib, vitamin d3, ciclopirox, clobetasol, clonazepam, coq-10, vitamin b-12, diphenhydramine, duloxetine, duloxetine, esomeprazole, fexofenadine, fluticasone, gabapentin, hydralazine, ketoconazole, linaclotide, losartan-hydrochlorothiazide, methimazole, methocarbamol, multiple vitamin, probiotic product, propranolol, sucralfate, terbinafine, tramadol, trazodone, triamcinolone cream, turmeric, and vitamin e. Her primarily concern today is the Back Pain (low) ? ?Initial Vital Signs:  ?Pulse/HCG Rate: 69ECG Heart Rate: 72 ?Temp: (!) 97.4 ?F (36.3 ?C) ?Resp: 18 ?BP: (!) 145/85 ?SpO2: 100 % ? ?BMI: Estimated body mass index is 38.08 kg/m? as calculated from the following: ?  Height as of this encounter: '5\' 11"'$  (1.803 m). ?  Weight as of this encounter: 273 lb (123.8 kg). ? ?Risk Assessment: ?Allergies: Reviewed. She is allergic to cephalexin, ibuprofen, potassium chloride, shellfish allergy, and aspirin.  ?Allergy Precautions: None required ?Coagulopathies: Reviewed. None identified.  ?Blood-thinner therapy: None at this time ?Active Infection(s): Reviewed. None identified. Christine Higgins is afebrile ? ?Site Confirmation: Christine Higgins was asked to confirm the procedure and laterality before marking the site ?Procedure checklist: Completed ?Consent: Before the procedure and under the influence of no sedative(s), amnesic(s), or anxiolytics, the patient was informed of the treatment options, risks and possible complications. To fulfill our ethical and legal obligations, as recommended by the American Medical Association's Code of  Ethics, I have informed the patient of my clinical impression; the nature and purpose of the treatment or procedure; the risks, benefits, and possible complications of the intervention; the alternatives, including doing nothing; the risk(s) and benefit(s) of the alternative treatment(s) or procedure(s); and the risk(s) and  benefit(s) of doing nothing. ?The patient was provided information about the general risks and possible complications associated with the procedure. These may include, but are not limited to: failure to achieve desired goals, infection, bleeding, organ or nerve damage, allergic reactions, paralysis, and death. ?In addition, the patient was informed of those risks and complications associated to Spine-related procedures, such as failure to decrease pain; infection (i.e.: Meningitis, epidural or intraspinal abscess); bleeding (i.e.: epidural hematoma, subarachnoid hemorrhage, or any other type of intraspinal or peri-dural bleeding); organ or nerve damage (i.e.: Any type of peripheral nerve, nerve root, or spinal cord injury) with subsequent damage to sensory, motor, and/or autonomic systems, resulting in permanent pain, numbness, and/or weakness of one or several areas of the body; allergic reactions; (i.e.: anaphylactic reaction); and/or death. ?Furthermore, the patient was informed of those risks and complications associated with the medications. These include, but are not limited to: allergic reactions (i.e.: anaphylactic or anaphylactoid reaction(s)); adrenal axis suppression; blood sugar elevation that in diabetics may result in ketoacidosis or comma; water retention that in patients with history of congestive heart failure may result in shortness of breath, pulmonary edema, and decompensation with resultant heart failure; weight gain; swelling or edema; medication-induced neural toxicity; particulate matter embolism and blood vessel occlusion with resultant organ, and/or nervous system  infarction; and/or aseptic necrosis of one or more joints. ?Finally, the patient was informed that Medicine is not an exact science; therefore, there is also the possibility of unforeseen or unpredictable risks and/or possible complications that may result in a catastrophic outcome. The patient indicated having understood very clearly. We have given the patient no guarantees and we have made no promises. Enough time was given to the patient to ask questions, all of which were answered to the patient's satisfaction. Ms. Dinger has indicated that she wanted to continue with the procedure. ?Attestation: I, the ordering provider, attest that I have discussed with the patient the benefits, risks, side-effects, alternatives, likelihood of achieving goals, and potential problems during recovery for the procedure that I have provided informed consent. ?Date  Time: 11/28/2021 10:21 AM ? ?Pre-Procedure Preparation:  ?Monitoring: As per clinic protocol. Respiration, ETCO2, SpO2, BP, heart rate and rhythm monitor placed and checked for adequate function ?Safety Precautions: Patient was assessed for positional comfort and pressure points before starting the procedure. ?Time-out: I initiated and conducted the "Time-out" before starting the procedure, as per protocol. The patient was asked to participate by confirming the accuracy of the "Time Out" information. Verification of the correct person, site, and procedure were performed and confirmed by me, the nursing staff, and the patient. "Time-out" conducted as per Joint Commission's Universal Protocol (UP.01.01.01). ?Time: 1118 ? ?Description of Procedure:          ?Target Area: Caudal Epidural Canal. ?Approach: Midline approach. ?Area Prepped: Entire Posterior Sacrococcygeal Region ?DuraPrep (Iodine Povacrylex [0.7% available iodine] and Isopropyl Alcohol, 74% w/w) ?Safety Precautions: Aspiration looking for blood return was conducted prior to all injections. At no point did we  inject any substances, as a needle was being advanced. No attempts were made at seeking any paresthesias. Safe injection practices and needle disposal techniques used. Medications properly checked for expiration dat

## 2021-11-28 NOTE — Patient Instructions (Signed)
Pain Management Discharge Instructions  General Discharge Instructions :  If you need to reach your doctor call: Monday-Friday 8:00 am - 4:00 pm at [phone] or toll free [phone].  After clinic hours [phone] to have operator reach doctor.  Bring all of your medication bottles to all your appointments in the pain clinic.  To cancel or reschedule your appointment with Pain Management please remember to call 24 hours in advance to avoid a fee.  Refer to the educational materials which you have been given on: General Risks, I had my Procedure. Discharge Instructions, Post Sedation.  Post Procedure Instructions:  The drugs you were given will stay in your system until tomorrow, so for the next 24 hours you should not drive, make any legal decisions or drink any alcoholic beverages.  You may eat anything you prefer, but it is better to start with liquids then soups and crackers, and gradually work up to solid foods.  Please notify your doctor immediately if you have any unusual bleeding, trouble breathing or pain that is not related to your normal pain.  Depending on the type of procedure that was done, some parts of your body may feel week and/or numb.  This usually clears up by tonight or the next day.  Walk with the use of an assistive device or accompanied by an adult for the 24 hours.  You may use ice on the affected area for the first 24 hours.  Put ice in a Ziploc bag and cover with a towel and place against area 15 minutes on 15 minutes off.  You may switch to heat after 24 hours.Epidural Steroid Injection Patient Information  Description: The epidural space surrounds the nerves as they exit the spinal cord.  In some patients, the nerves can be compressed and inflamed by a bulging disc or a tight spinal canal (spinal stenosis).  By injecting steroids into the epidural space, we can bring irritated nerves into direct contact with a potentially helpful medication.  These  steroids act directly on the irritated nerves and can reduce swelling and inflammation which often leads to decreased pain.  Epidural steroids may be injected anywhere along the spine and from the neck to the low back depending upon the location of your pain.   After numbing the skin with local anesthetic (like Novocaine), a small needle is passed into the epidural space slowly.  You may experience a sensation of pressure while this is being done.  The entire block usually last less than 10 minutes.  Conditions which may be treated by epidural steroids:  Low back and leg pain Neck and arm pain Spinal stenosis Post-laminectomy syndrome Herpes zoster (shingles) pain Pain from compression fractures  Preparation for the injection:  Do not eat any solid food or dairy products within 8 hours of your appointment.  You may drink clear liquids up to 3 hours before appointment.  Clear liquids include water, black coffee, juice or soda.  No milk or cream please. You may take your regular medication, including pain medications, with a sip of water before your appointment  Diabetics should hold regular insulin (if taken separately) and take 1/2 normal NPH dos the morning of the procedure.  Carry some sugar containing items with you to your appointment. A driver must accompany you and be prepared to drive you home after your procedure.  Bring all your current medications with your. An IV may be inserted and sedation may be given at the discretion of the physician.     A blood pressure cuff, EKG and other monitors will often be applied during the procedure.  Some patients may need to have extra oxygen administered for a short period. You will be asked to provide medical information, including your allergies, prior to the procedure.  We must know immediately if you are taking blood thinners (like Coumadin/Warfarin)  Or if you are allergic to IV iodine contrast (dye). We must know if you could possible be  pregnant.  Possible side-effects: Bleeding from needle site Infection (rare, may require surgery) Nerve injury (rare) Numbness & tingling (temporary) Difficulty urinating (rare, temporary) Spinal headache ( a headache worse with upright posture) Light -headedness (temporary) Pain at injection site (several days) Decreased blood pressure (temporary) Weakness in arm/leg (temporary) Pressure sensation in back/neck (temporary)  Call if you experience: Fever/chills associated with headache or increased back/neck pain. Headache worsened by an upright position. New onset weakness or numbness of an extremity below the injection site Hives or difficulty breathing (go to the emergency room) Inflammation or drainage at the infection site Severe back/neck pain Any new symptoms which are concerning to you  Please note:  Although the local anesthetic injected can often make your back or neck feel good for several hours after the injection, the pain will likely return.  It takes 3-7 days for steroids to work in the epidural space.  You may not notice any pain relief for at least that one week.  If effective, we will often do a series of three injections spaced 3-6 weeks apart to maximally decrease your pain.  After the initial series, we generally will wait several months before considering a repeat injection of the same type.  If you have any questions, please call [phone] [facility] 

## 2021-11-28 NOTE — Progress Notes (Signed)
Safety precautions to be maintained throughout the outpatient stay will include: orient to surroundings, keep bed in low position, maintain call bell within reach at all times, provide assistance with transfer out of bed and ambulation.  

## 2021-11-29 ENCOUNTER — Telehealth: Payer: Self-pay

## 2021-11-29 NOTE — Telephone Encounter (Signed)
Post procedure follow up phone call.  Patient states she is doing well.  ?

## 2021-12-04 DIAGNOSIS — E118 Type 2 diabetes mellitus with unspecified complications: Secondary | ICD-10-CM | POA: Diagnosis not present

## 2021-12-04 DIAGNOSIS — D709 Neutropenia, unspecified: Secondary | ICD-10-CM | POA: Diagnosis not present

## 2021-12-04 DIAGNOSIS — Z6838 Body mass index (BMI) 38.0-38.9, adult: Secondary | ICD-10-CM | POA: Diagnosis not present

## 2021-12-04 DIAGNOSIS — E7800 Pure hypercholesterolemia, unspecified: Secondary | ICD-10-CM | POA: Diagnosis not present

## 2021-12-04 DIAGNOSIS — I10 Essential (primary) hypertension: Secondary | ICD-10-CM | POA: Diagnosis not present

## 2021-12-04 DIAGNOSIS — R0600 Dyspnea, unspecified: Secondary | ICD-10-CM | POA: Diagnosis not present

## 2021-12-04 DIAGNOSIS — I700 Atherosclerosis of aorta: Secondary | ICD-10-CM | POA: Diagnosis not present

## 2021-12-04 DIAGNOSIS — F325 Major depressive disorder, single episode, in full remission: Secondary | ICD-10-CM | POA: Diagnosis not present

## 2021-12-05 ENCOUNTER — Telehealth: Payer: Self-pay

## 2021-12-05 NOTE — Telephone Encounter (Signed)
Patient states she has a very itchy rash on her back and wanted to been seen today, but we have no providers in the office at this time. She states Dr. Laurence Ferrari prescribed her Atarax in the past to help with itching and she would like that sent in if possible. Advised patient that we have no openings for the next couple of weeks and she should contact her PCP or urgent care for rash condition if needed before her appointment on 12/18/21. Dr. Nicole Kindred sent her in [facility] 0.1% cream at her visit last month and I advised patient that it was prescribed for rash and she could use it QD-BID PRN and avoid applying to face, groin, and axilla. Use as directed. Long-term use can cause thinning of the skin. Patient uses [facility]. Would you like for me to send in a refill of the Atarax?  ? ? ?

## 2021-12-06 MED ORDER — HYDROXYZINE HCL 10 MG PO TABS: 10.0000 mg | ORAL_TABLET | ORAL | 0 refills | Status: AC

## 2021-12-06 NOTE — Telephone Encounter (Signed)
Hydroxyzine RF sent in. Left patient VM to return my call.  ?

## 2021-12-07 ENCOUNTER — Telehealth: Payer: Self-pay | Admitting: Student in an Organized Health Care Education/Training Program

## 2021-12-07 NOTE — Telephone Encounter (Signed)
I called and spoke with Christine Higgins as well as a staff member at the assisted living where she is a resident.  The nurse was trying to give her tylenol 500 mg and Christine Higgins thought it was oxycontin.  We all collectivley figured out that it is just acetaminophen.  Christine Higgins u/o information.   ?

## 2021-12-07 NOTE — Telephone Encounter (Signed)
Vmail 12-07-21 ?Patient wants to know if [name] wrote a script for Oxycontin. Her nurse brought it in to her today. Please advise ?

## 2021-12-10 NOTE — Telephone Encounter (Signed)
Patient advised of information per Dr. Kowalski. aw 

## 2021-12-11 ENCOUNTER — Other Ambulatory Visit: Payer: Self-pay | Admitting: Dermatology

## 2021-12-11 DIAGNOSIS — L299 Pruritus, unspecified: Secondary | ICD-10-CM

## 2021-12-12 DIAGNOSIS — G5603 Carpal tunnel syndrome, bilateral upper limbs: Secondary | ICD-10-CM | POA: Diagnosis not present

## 2021-12-14 ENCOUNTER — Telehealth: Payer: Self-pay | Admitting: Student in an Organized Health Care Education/Training Program

## 2021-12-14 NOTE — Telephone Encounter (Signed)
Informed patient ok to take Tylenol, but do not take Codeine. ?

## 2021-12-18 ENCOUNTER — Ambulatory Visit (INDEPENDENT_AMBULATORY_CARE_PROVIDER_SITE_OTHER): Payer: Medicare HMO | Admitting: Dermatology

## 2021-12-18 DIAGNOSIS — L309 Dermatitis, unspecified: Secondary | ICD-10-CM | POA: Diagnosis not present

## 2021-12-18 DIAGNOSIS — B351 Tinea unguium: Secondary | ICD-10-CM

## 2021-12-18 DIAGNOSIS — Z7189 Other specified counseling: Secondary | ICD-10-CM

## 2021-12-18 DIAGNOSIS — L219 Seborrheic dermatitis, unspecified: Secondary | ICD-10-CM

## 2021-12-18 MED ORDER — TRIAMCINOLONE ACETONIDE 0.1 % EX CREA: TOPICAL_CREAM | CUTANEOUS | 2 refills | Status: DC

## 2021-12-18 MED ORDER — TERBINAFINE HCL 250 MG PO TABS: 250.0000 mg | ORAL_TABLET | Freq: Every day | ORAL | 1 refills | Status: DC

## 2021-12-18 NOTE — Progress Notes (Signed)
? ?Follow-Up Visit ?  ?Subjective  ?Christine Higgins is a 66 y.o. female who presents for the following: Follow-up. ? ?Patient here for 1 month follow-up onychomycosis of the toenails. She is on terbinafine '250mg'$  daily (x 1 month), with some improvement. She had taken pulse dose of terbinafine in the past > 94yrago. She also uses ciclopirox cream to feet daily.  ? ?Patient has itching on back that started a couple of weeks ago. She is using TMC cream and taking hydroxyzine as needed for itch. She has had Patch Testing in the past with positive reactions to #35 disperse blue 106 and #36 2 Bromo-2-Nitropropane-1, 3-diol. She uses Gain detergent and [employer] ? ?Seborrheic Dermatitis of the scalp much improved with ketoconazole shampoo and clobetasol solution prn.  ? ?The following portions of the chart were reviewed this encounter and updated as appropriate:  ?  ?  ? ?Review of Systems:  No other skin or systemic complaints except as noted in HPI or Assessment and Plan. ? ?Objective  ?Well appearing patient in no apparent distress; mood and affect are within normal limits. ? ?A focused examination was performed including face, back, toenails. Relevant physical exam findings are noted in the Assessment and Plan. ? ?feet, toenails ?Thickened toenails and subungual debris with slight clearance at the base ? ? ? ? ?Back, abdomen ?Hyperpigmented scaly patch on spinal lower back, upper back, upper abdomen, with mild erythema ? ?Scalp ?Clear today. ? ? ? ?Assessment & Plan  ?Onychomycosis ?feet, toenails ? ?Some improvement. ? ?LFTs reviewed from 12/04/21, wnl. ? ?Continue terbinafine 250 mg take 1 PO qd with food dsp #30 1Rf. ?May need 4 mos tx to get desired results. ? ?Continue Ciclopirox Cream to feet, between toes qd/bid. ? ?Terbinafine Counseling ? ?Terbinafine is an anti-fungal medicine that can be applied to the skin (over the counter) or taken by mouth (prescription) to treat fungal infections. The pill version  is often used to treat fungal infections of the nails or scalp. While most people do not have any side effects from taking terbinafine pills, some possible side effects of the medicine can include taste changes, headache, loss of smell, vision changes, nausea, vomiting, or diarrhea.  ? ?Rare side effects can include irritation of the liver, allergic reaction, or decrease in blood counts (which may show up as not feeling well or developing an infection). If you are concerned about any of these side effects, please stop the medicine and call your doctor, or in the case of an emergency such as feeling very unwell, seek immediate medical care.  ? ? ?terbinafine (LAMISIL) 250 MG tablet - feet, toenails ?Take 1 tablet (250 mg total) by mouth daily. Take with food. ? ?Related Medications ?ciclopirox (LOPROX) 0.77 % cream ?Apply to feet, between toes, and around toenails once to twice daily. ? ?Dermatitis ?Back, abdomen ? ?Possible Contact Dermatitis ?  ?Patient has positive reactions to #35 disperse blue 106 and #36 2 Bromo-2-Nitropropane-1, 3-diol with True Test Patch Test. Handouts given on what to avoid. ? ?D/C Gain detergent and start Tide Free or All free ?Recommend Vanicream products or visit www.skinsafeproducts.com for products that do not contain 2 Bromo-2-Nitropropane-1, 3-diol (Bronopol). ?  ?Continue TMC 0.1% Cream Apply to AA rash qd/bid prn flares dsp 454g 2Rf. Avoid face, groin, axilla. ? ?Continue hydroxyzine '10mg'$  take 1-3 po qhs prn itch. ? ?Topical steroids (such as triamcinolone, fluocinolone, fluocinonide, mometasone, clobetasol, halobetasol, betamethasone, hydrocortisone) can cause thinning and lightening of the  skin if they are used for too long in the same area. Your physician has selected the right strength medicine for your problem and area affected on the body. Please use your medication only as directed by your physician to prevent side effects.  ?  ?  ? ?Related Medications ?triamcinolone cream  (KENALOG) 0.1 % ?Apply to affected area rash on abdomen 1-2 times a day as needed for itch. Avoid face, groin, axilla. ? ?Seborrheic dermatitis ?Scalp ? ?Chronic condition with duration or expected duration over one year. Currently well-controlled.  ? ?Seborrheic Dermatitis ?-  is a chronic persistent rash characterized by pinkness and scaling most commonly of the mid face but also can occur on the scalp (dandruff), ears; mid chest, mid back and groin.  It tends to be exacerbated by stress and cooler weather.  People who have neurologic disease may experience new onset or exacerbation of existing seborrheic dermatitis.  The condition is not curable but treatable and can be controlled. ? ?Continue ketoconazole 2% shampoo to scalp 2x/wk, let sit several minutes before rinsing. ? ?Continue Clobetasol solution 1-2 drops to AA scalp prn itch. Avoid face, groin, axilla.  ? ?Related Medications ?ketoconazole (NIZORAL) 2 % shampoo ?Massage into scalp and let sit 10-15 mins before rinsing. Use 2x/week. ? ?clobetasol (TEMOVATE) 0.05 % external solution ?Apply 1-2 drops to affected area scalp as needed for itch. Avoid face, groin, axilla. ? ? ?Return in about 2 months (around 02/17/2022) for toenails, dermatitis. ? ?I, Jamesetta Orleans, CMA, am acting as scribe for Brendolyn Patty, MD . ?Documentation: I have reviewed the above documentation for accuracy and completeness, and I agree with the above. ? ?Brendolyn Patty MD  ? ? ?

## 2021-12-18 NOTE — Patient Instructions (Addendum)
https://www.skinsafeproducts.com ? ?Check products (detergents, soaps, lotions) for 2 Bromo-2-Nitropropane-1, 3-diol (Bronopol) ? ? ? ?Terbinafine Counseling ? ?Terbinafine is an anti-fungal medicine that can be applied to the skin (over the counter) or taken by mouth (prescription) to treat fungal infections. The pill version is often used to treat fungal infections of the nails or scalp. While most people do not have any side effects from taking terbinafine pills, some possible side effects of the medicine can include taste changes, headache, loss of smell, vision changes, nausea, vomiting, or diarrhea.  ? ?Rare side effects can include irritation of the liver, allergic reaction, or decrease in blood counts (which may show up as not feeling well or developing an infection). If you are concerned about any of these side effects, please stop the medicine and call your doctor, or in the case of an emergency such as feeling very unwell, seek immediate medical care.  ? ?Topical steroids (such as triamcinolone, fluocinolone, fluocinonide, mometasone, clobetasol, halobetasol, betamethasone, hydrocortisone) can cause thinning and lightening of the skin if they are used for too long in the same area. Your physician has selected the right strength medicine for your problem and area affected on the body. Please use your medication only as directed by your physician to prevent side effects.  ? ?If You Need Anything After Your Visit ? ?If you have any questions or concerns for your doctor, please call our main line at [phone] and press option 4 to reach your doctor's medical assistant. If no one answers, please leave a voicemail as directed and we will return your call as soon as possible. Messages left after 4 pm will be answered the following business day.  ? ?You may also send [place] a message via MyChart. We typically respond to MyChart messages within 1-2 business days. ? ?For prescription refills, please ask your  pharmacy to contact our office. Our fax number is [phone]. ? ?If you have an urgent issue when the clinic is closed that cannot wait until the next business day, you can page your doctor at the number below.   ? ?Please note that while we do our best to be available for urgent issues outside of office hours, we are not available 24/7.  ? ?If you have an urgent issue and are unable to reach [place], you may choose to seek medical care at your doctor's office, retail clinic, urgent care center, or emergency room. ? ?If you have a medical emergency, please immediately call 911 or go to the emergency department. ? ?Pager Numbers ? ?- Dr. Nehemiah Massed: [phone] ? ?- Dr. Laurence Ferrari: [phone] ? ?- Dr. Nicole Kindred: [phone] ? ?In the event of inclement weather, please call our main line at [phone] for an update on the status of any delays or closures. ? ?Dermatology Medication Tips: ?Please keep the boxes that topical medications come in in order to help keep track of the instructions about where and how to use these. Pharmacies typically print the medication instructions only on the boxes and not directly on the medication tubes.  ? ?If your medication is too expensive, please contact our office at [phone] option 4 or send [place] a message through [place].  ? ?We are unable to tell what your co-pay for medications will be in advance as this is different depending on your insurance coverage. However, we may be able to find a substitute medication at lower cost or fill out paperwork to get insurance to cover a needed medication.  ? ?If a prior authorization  is required to get your medication covered by your insurance company, please allow [place] 1-2 business days to complete this process. ? ?Drug prices often vary depending on where the prescription is filled and some pharmacies may offer cheaper prices. ? ?The website www.goodrx.com contains coupons for medications through different pharmacies. The prices here do not  account for what the cost may be with help from insurance (it may be cheaper with your insurance), but the website can give you the price if you did not use any insurance.  ?- You can print the associated coupon and take it with your prescription to the pharmacy.  ?- You may also stop by our office during regular business hours and pick up a GoodRx coupon card.  ?- If you need your prescription sent electronically to a different pharmacy, notify our office through [facility] or by phone at [phone] option 4. ? ? ? ? ?Si Usted Necesita Algo Despu?s de Su Visita ? ?Tambi?n puede enviarnos un mensaje a trav?s de MyChart. Por lo general respondemos a los mensajes de MyChart en el transcurso de 1 a 2 d?as h?biles. ? ?Para renovar recetas, por favor pida a su farmacia que se ponga en contacto con nuestra oficina. Nuestro n?mero de fax es el [phone]. ? ?Si tiene un asunto urgente cuando la cl?nica est? cerrada y que no puede esperar hasta el siguiente d?a h?bil, puede llamar/localizar a su doctor(a) al n?mero que aparece a continuaci?n.  ? ?Por favor, tenga en cuenta que aunque hacemos todo lo posible para estar disponibles para asuntos urgentes fuera del horario de oficina, no estamos disponibles las 24 horas del d?a, los 7 d?as de la semana.  ? ?Si tiene un problema urgente y no puede comunicarse con nosotros, puede optar por buscar atenci?n m?dica  en el consultorio de su doctor(a), en una cl?nica privada, en un centro de atenci?n urgente o en una sala de emergencias. ? ?Si tiene Engineer, maintenance (IT) m?dica, por favor llame inmediatamente al 911 o vaya a la sala de emergencias. ? ?N?meros de b?per ? ?- Dr. Nehemiah Massed: [phone] ? ?- Dra. Moye: [phone] ? ?- Dra. Nicole Kindred: [phone] ? ?En caso de inclemencias del tiempo, por favor llame a nuestra l?nea principal al [phone] para una actualizaci?n sobre el estado de cualquier retraso o cierre. ? ?Consejos para la medicaci?n en dermatolog?a: ?Por  favor, guarde las cajas en las que vienen los medicamentos de uso t?pico para ayudarle a seguir las instrucciones sobre d?nde y c?mo usarlos. Las farmacias generalmente imprimen las instrucciones del medicamento s?lo en las cajas y no directamente en los tubos del [place].  ? ?Si su medicamento es muy caro, por favor, p?ngase en contacto con Zigmund Daniel llamando al [phone] y presione la opci?n 4 o env?enos un mensaje a trav?s de MyChart.  ? ?No podemos decirle cu?l ser? su copago por los medicamentos por adelantado ya que esto es diferente dependiendo de la cobertura de su seguro. Sin embargo, es posible que podamos encontrar un medicamento sustituto a Electrical engineer un formulario para que el seguro cubra el medicamento que se considera necesario.  ? ?Si se requiere Ardelia Mems autorizaci?n previa para que su compa??a de seguros [place] su medicamento, por favor perm?tanos de 1 a 2 d?as h?biles para completar este proceso. ? ?Los precios de los medicamentos var?an con frecuencia dependiendo del Environmental consultant de d?nde se surte la receta y alguna farmacias pueden ofrecer precios m?s baratos. ? ?El sitio web www.goodrx.com tiene cupones para medicamentos  de [employer]. Los precios aqu? no tienen en cuenta lo que podr?a costar con la ayuda del seguro (puede ser m?s barato con su seguro), pero el sitio web puede darle el precio si no utiliz? ning?n seguro.  ?- Puede imprimir el cup?n correspondiente y llevarlo con su receta a la farmacia.  ?- Tambi?n puede pasar por nuestra oficina durante el horario de atenci?n regular y recoger una tarjeta de cupones de GoodRx.  ?- Si necesita que su receta se env?e electr?nicamente a Chiropodist, informe a nuestra oficina a trav?s de MyChart de [place] o por tel?fono llamando al [phone] y presione la opci?n 4. ? ?

## 2021-12-25 DIAGNOSIS — I700 Atherosclerosis of aorta: Secondary | ICD-10-CM | POA: Diagnosis not present

## 2021-12-25 DIAGNOSIS — F325 Major depressive disorder, single episode, in full remission: Secondary | ICD-10-CM | POA: Diagnosis not present

## 2021-12-25 DIAGNOSIS — E118 Type 2 diabetes mellitus with unspecified complications: Secondary | ICD-10-CM | POA: Diagnosis not present

## 2021-12-25 DIAGNOSIS — L4050 Arthropathic psoriasis, unspecified: Secondary | ICD-10-CM | POA: Diagnosis not present

## 2022-01-03 DIAGNOSIS — L4050 Arthropathic psoriasis, unspecified: Secondary | ICD-10-CM | POA: Diagnosis not present

## 2022-01-14 ENCOUNTER — Telehealth: Payer: Self-pay | Admitting: Student in an Organized Health Care Education/Training Program

## 2022-01-14 NOTE — Telephone Encounter (Signed)
Patient wants a nurse to call her about her increased pain

## 2022-01-14 NOTE — Telephone Encounter (Signed)
Needs VV to discuss an injection. Please call and set up. Thank you.

## 2022-01-15 ENCOUNTER — Encounter: Payer: Self-pay | Admitting: Student in an Organized Health Care Education/Training Program

## 2022-01-15 ENCOUNTER — Ambulatory Visit
Payer: Medicare HMO | Attending: Student in an Organized Health Care Education/Training Program | Admitting: Student in an Organized Health Care Education/Training Program

## 2022-01-15 DIAGNOSIS — M961 Postlaminectomy syndrome, not elsewhere classified: Secondary | ICD-10-CM | POA: Diagnosis not present

## 2022-01-15 DIAGNOSIS — Z9889 Other specified postprocedural states: Secondary | ICD-10-CM | POA: Diagnosis not present

## 2022-01-15 DIAGNOSIS — M545 Low back pain, unspecified: Secondary | ICD-10-CM

## 2022-01-15 DIAGNOSIS — M5416 Radiculopathy, lumbar region: Secondary | ICD-10-CM | POA: Diagnosis not present

## 2022-01-15 DIAGNOSIS — G894 Chronic pain syndrome: Secondary | ICD-10-CM | POA: Diagnosis not present

## 2022-01-15 NOTE — Progress Notes (Signed)
Patient: Christine Higgins  Service Category: E/M  Provider: [name], MD  DOB: 09-20-1955  DOS: 01/15/2022  Location: Office  MRN: [id number]  Setting: Ambulatory outpatient  Referring Provider: Kirk Ruths, MD  Type: Established Patient  Specialty: Interventional Pain Management  PCP: Kirk Ruths, MD  Location: Remote location  Delivery: TeleHealth     Virtual Encounter - Pain Management PROVIDER NOTE: Information contained herein reflects review and annotations entered in association with encounter. Interpretation of such information and data should be left to medically-trained personnel. Information provided to patient can be located elsewhere in the medical record under "Patient Instructions". Document created using STT-dictation technology, any transcriptional errors that may result from process are unintentional.    Contact & Pharmacy Preferred: [phone] Home: [phone] (home) Mobile: There is no such number on file (mobile). E-mail: [email or web address]  [facility] - [place], [place] - [address] [place] [address] Phone: [phone] Fax: [phone]  [id number] - [name] [facility] [address] [place] [address] Phone: [phone] Fax: [phone]   Pre-screening  Christine Higgins offered "in-person" vs "virtual" encounter. She indicated preferring virtual for this encounter.   Reason COVID-19*  Social distancing based on CDC and AMA recommendations.   I contacted Christine Higgins on 01/15/2022 via telephone.      I clearly identified myself as [name], MD. I verified that I was speaking with the correct person using two identifiers (Name: Christine Higgins, and date of birth: 20-Dec-1955).  Consent I sought verbal advanced consent from Christine Higgins for virtual visit interactions. I informed Christine Higgins of possible security and privacy concerns, risks, and limitations associated with  providing "not-in-person" medical evaluation and management services. I also informed Christine Higgins of the availability of "in-person" appointments. Finally, I informed her that there would be a charge for the virtual visit and that she could be  personally, fully or partially, financially responsible for it. Christine Higgins expressed understanding and agreed to proceed.   Historic Elements   Christine Higgins is a 66 y.o. year old, female patient evaluated today after our last contact on 01/14/2022. Christine Higgins  has a past medical history of Anxiety, Asthma, Breast mass, Cervical disc disease, Chronic pain syndrome, COPD (chronic obstructive pulmonary disease) ([place]), Degenerative disc disease, lumbar, Depression, Fibromyalgia, Fibromyalgia, GERD (gastroesophageal reflux disease), Glaucoma, Graves disease, Headache, Hemorrhoids, Hyperlipidemia, Hypertension, Hyperthyroidism, Lumbar disc disease, Obesity, Pre-diabetes, Psoriatic arthritis ([place]), and Thyroid disease. She also  has a past surgical history that includes carpel tunn (Right); Hand surgery (Right); carpel tunnel (Left); Cesarean section; Shoulder surgery (Right); Back surgery; Neck surgery; Total hip arthroplasty (Right); Excision Morton's neuroma (Left, 02/05/2017); Colonoscopy; Joint replacement; Esophagogastroduodenoscopy (egd) with propofol (N/A, 06/26/2017); Knee Arthroplasty (Left, 08/11/2017); Knee arthroscopy (Right, 07/02/2018); Reverse shoulder arthroplasty (Left, 06/24/2019); Thoracic laminectomy for spinal cord stimulator (N/A, 08/07/2020); Colonoscopy with propofol (N/A, 10/20/2020); Cardiac catheterization; Tubal ligation; Knee Arthroplasty (Right, 02/05/2021); and Spinal cord stimulator removal (N/A, 04/09/2021). Christine Higgins has a current medication list which includes the following prescription(s): acetaminophen, vitamin c, atorvastatin, vitamin a, budesonide-formoterol, calcium carbonate, celecoxib, vitamin d3, ciclopirox, clobetasol, clonazepam, coq-10,  vitamin b-12, diphenhydramine, duloxetine, duloxetine, esomeprazole, fexofenadine, fluticasone, gabapentin, hydralazine, ketoconazole, linaclotide, losartan-hydrochlorothiazide, methimazole, methocarbamol, multiple vitamin, probiotic product, propranolol, sucralfate, terbinafine, tramadol, trazodone, triamcinolone cream, turmeric, and vitamin e. She  reports that she quit smoking about 29 years ago. Her smoking use included cigarettes. She has never used  smokeless tobacco. She reports that she does not currently use drugs. She reports that she does not drink alcohol. Christine Higgins is allergic to cephalexin, ibuprofen, potassium chloride, shellfish allergy, and aspirin.   HPI  Today, she is being contacted for worsening of previously known (established) problem  Patient is complaining of increased low back pain with radiation into her right leg in a dermatomal fashion.  Her previous caudal ESI was 11/28/2021.  She is requesting a repeat injection.  I informed her that we should limit her neuraxial steroid injections to no more than 4 times in a given year.  Patient endorsed understanding.  She is only had 1 caudal injection this year so we will get her set up for another one in the next couple of weeks to target her right radicular pain flare.  Pharmacotherapy Assessment   Opioid Analgesic: Tramadol 100 mg twice daily, quantity 120/month MME equals 20   Monitoring: [place] PMP: PDMP not reviewed this encounter.       Pharmacotherapy: No side-effects or adverse reactions reported. Compliance: No problems identified. Effectiveness: Clinically acceptable. Plan: Refer to "POC". UDS:  Summary  Date Value Ref Range Status  11/15/2021 Note  Final    Comment:    ==================================================================== [employer] Select 13 (MW) ==================================================================== Test                             Result       Flag       Units  Drug Present and Declared  for Prescription Verification   7-aminoclonazepam              42           EXPECTED   ng/mg creat    7-aminoclonazepam is an expected metabolite of clonazepam. Source of    clonazepam is a scheduled prescription medication.    Tramadol                       >6944        EXPECTED   ng/mg creat   O-Desmethyltramadol            5594         EXPECTED   ng/mg creat   N-Desmethyltramadol            >6944        EXPECTED   ng/mg creat    Source of tramadol is a prescription medication. O-desmethyltramadol    and N-desmethyltramadol are expected metabolites of tramadol.  ==================================================================== Test                      Result    Flag   Units      Ref Range   Creatinine              72               mg/dL      >=20 ==================================================================== Declared Medications:  The flagging and interpretation on this report are based on the  following declared medications.  Unexpected results may arise from  inaccuracies in the declared medications.   **Note: The testing scope of this panel includes these medications:   Clonazepam (Klonopin)  Tramadol (Ultram)   **Note: The testing scope of this panel does not include the  following reported medications:   Acetaminophen (Tylenol)  Atorvastatin (Lipitor)  Budesonide (Symbicort)  Calcium  Celecoxib (Celebrex)  Ciclopirox (  Loprox)  Clobetasol (Temovate)  Diphenhydramine (Benadryl)  Duloxetine (Cymbalta)  Esomeprazole (Nexium)  Fexofenadine (Allegra)  Fluticasone (Flonase)  Formoterol (Symbicort)  Gabapentin (Neurontin)  Hydralazine (Apresoline)  Hydrochlorothiazide (Hyzaar)  Ketoconazole (Nizoral)  Linaclotide (Linzess)  Losartan (Hyzaar)  Methimazole (Tapazole)  Methocarbamol (Robaxin)  Multivitamin  Probiotic  Propranolol (Inderal)  Sucralfate (Carafate)  Terbinafine (Lamisil)  Trazodone (Desyrel)  Triamcinolone (Kenalog)  Turmeric  Ubiquinone  (CoQ10)  Vitamin A  Vitamin B12  Vitamin C  Vitamin D3  Vitamin E ==================================================================== For clinical consultation, please call [phone]. ====================================================================      Laboratory Chemistry Profile   Renal Lab Results  Component Value Date   BUN 7 (L) 04/04/2021   CREATININE 0.77 04/04/2021   BCR 11 (L) 04/27/2020   GFRAA 83 04/27/2020   GFRNONAA >60 04/04/2021    Hepatic Lab Results  Component Value Date   AST 21 01/25/2021   ALT 20 01/25/2021   ALBUMIN 4.3 01/25/2021   ALKPHOS 81 01/25/2021   LIPASE 27 12/14/2016    Electrolytes Lab Results  Component Value Date   NA 139 04/04/2021   K 4.3 04/04/2021   CL 102 04/04/2021   CALCIUM 9.7 04/04/2021   MG 1.9 08/27/2014    Bone No results found for: VD25OH, VD125OH2TOT, TX5217GJ1, TN5396DS8, 25OHVITD1, 25OHVITD2, 25OHVITD3, TESTOFREE, TESTOSTERONE  Inflammation (CRP: Acute Phase) (ESR: Chronic Phase) Lab Results  Component Value Date   CRP 0.5 01/25/2021   ESRSEDRATE 6 01/25/2021         Note: Above Lab results reviewed.   Assessment  The primary encounter diagnosis was Lumbar radiculopathy. Diagnoses of Failed back surgical syndrome, Back pain at L4-L5 level, History of lumbar laminectomy for spinal cord decompression, and Chronic pain syndrome were also pertinent to this visit.  Plan of Care    [name] has a current medication list which includes the following long-term medication(s): atorvastatin, budesonide-formoterol, calcium carbonate, clonazepam, duloxetine, duloxetine, fexofenadine, fluticasone, gabapentin, hydralazine, linaclotide, losartan-hydrochlorothiazide, methimazole, propranolol, and trazodone.    Orders:  Orders Placed This Encounter  Procedures   Caudal Epidural Injection    Standing Status:   Future    Standing Expiration Date:   02/15/2022    Scheduling Instructions:      Laterality: RIGHT     Level(s): Sacrococcygeal canal (Tailbone area)     Sedation: Patient's choice     Scheduling Timeframe: As soon as pre-approved    Order Specific Question:   Where will this procedure be performed?    Answer:   [facility]   Follow-up plan:   Return in about 15 days (around 01/30/2022) for caudal ESI, in clinic [facility].    Recent Visits Date Type Provider Dept  11/28/21 Procedure visit [name], MD [facility]  11/15/21 Office Visit [name], MD [facility]  Showing recent visits within past 90 days and meeting all other requirements Today's Visits Date Type Provider Dept  01/15/22 Office Visit [name], MD [facility]  Showing today's visits and meeting all other requirements Future Appointments Date Type Provider Dept  02/12/22 Appointment [name], MD [facility]  Showing future appointments within next 90 days and meeting all other requirements  I discussed the assessment and treatment plan with the patient. The patient was provided an opportunity to ask questions and all were answered. The patient agreed with the plan and demonstrated an understanding of the instructions.  Patient advised to call back or seek an in-person evaluation if the  symptoms or condition worsens.  Duration of encounter: 75minutes.  Note by: [name], MD Date: 01/15/2022; Time: 1:49 PM

## 2022-01-16 DIAGNOSIS — M810 Age-related osteoporosis without current pathological fracture: Secondary | ICD-10-CM | POA: Diagnosis not present

## 2022-01-17 ENCOUNTER — Telehealth: Payer: Self-pay | Admitting: Pain Medicine

## 2022-01-17 NOTE — Telephone Encounter (Signed)
Patient called stating she called pharmacy and they do not have a script for this month to fill. They have next month. Please call pharmacy and patient

## 2022-01-17 NOTE — Telephone Encounter (Signed)
Spoke with patients pharmacy and they states they do have script and will fill it today.  Patient notified.

## 2022-01-23 DIAGNOSIS — H524 Presbyopia: Secondary | ICD-10-CM | POA: Diagnosis not present

## 2022-01-28 ENCOUNTER — Ambulatory Visit
Payer: Medicare HMO | Attending: Student in an Organized Health Care Education/Training Program | Admitting: Student in an Organized Health Care Education/Training Program

## 2022-01-28 ENCOUNTER — Ambulatory Visit
Admission: RE | Admit: 2022-01-28 | Discharge: 2022-01-28 | Disposition: A | Discharge status: Home / Self Care | Payer: Medicare HMO | Source: Ambulatory Visit | Attending: Student in an Organized Health Care Education/Training Program | Admitting: Student in an Organized Health Care Education/Training Program

## 2022-01-28 ENCOUNTER — Encounter: Payer: Self-pay | Admitting: Student in an Organized Health Care Education/Training Program

## 2022-01-28 VITALS — BP 148/82 | HR 69 | Temp 97.1°F | Resp 16 | Ht 69.0 in | Wt 273.0 lb

## 2022-01-28 DIAGNOSIS — M961 Postlaminectomy syndrome, not elsewhere classified: Secondary | ICD-10-CM | POA: Diagnosis not present

## 2022-01-28 DIAGNOSIS — M545 Low back pain, unspecified: Secondary | ICD-10-CM | POA: Diagnosis not present

## 2022-01-28 DIAGNOSIS — M5416 Radiculopathy, lumbar region: Secondary | ICD-10-CM | POA: Diagnosis not present

## 2022-01-28 MED ORDER — IOHEXOL 180 MG/ML  SOLN
10.0000 mL | Freq: Once | INTRAMUSCULAR | Status: AC
  Administered 2022-01-28: 5 mL via EPIDURAL

## 2022-01-28 MED ORDER — SODIUM CHLORIDE 0.9% FLUSH
2.0000 mL | Freq: Once | INTRAVENOUS | Status: AC
  Administered 2022-01-28: 2 mL

## 2022-01-28 MED ORDER — DEXAMETHASONE SODIUM PHOSPHATE 10 MG/ML IJ SOLN
10.0000 mg | Freq: Once | INTRAMUSCULAR | Status: AC
  Administered 2022-01-28: 10 mg

## 2022-01-28 MED ORDER — ROPIVACAINE HCL 2 MG/ML IJ SOLN
2.0000 mL | Freq: Once | INTRAMUSCULAR | Status: AC
  Administered 2022-01-28: 2 mL via EPIDURAL

## 2022-01-28 MED ORDER — LIDOCAINE HCL 2 % IJ SOLN
20.0000 mL | Freq: Once | INTRAMUSCULAR | Status: AC
  Administered 2022-01-28: 100 mg

## 2022-01-28 NOTE — Progress Notes (Signed)
PROVIDER NOTE: Information contained herein reflects review and annotations entered in association with encounter. Interpretation of such information and data should be left to medically-trained personnel. Information provided to patient can be located elsewhere in the medical record under "Patient Instructions". Document created using STT-dictation technology, any transcriptional errors that may result from process are unintentional.    Patient: Christine Higgins  Service Category: Procedure  Provider: [name], MD  DOB: 1956-03-05  DOS: 01/28/2022  Location: [facility]  MRN: [id number]  Setting: Ambulatory - outpatient  Referring Provider: [name], MD  Type: Established Patient  Specialty: Interventional Pain Management  PCP: Kirk Ruths, MD   Primary Reason for Visit: Interventional Pain Management Treatment. CC: Back Pain (lower)  Procedure:          Anesthesia, Analgesia, Anxiolysis:  Type: Palliative Epidural Steroid Injection          Region: Caudal aiming to the right Level: Sacrococcygeal   Laterality: Midline       Type: Local Anesthesia  Local Anesthetic: Lidocaine 1-2%  Position: Prone   Indications: 1. Failed back surgical syndrome   2. Lumbar radiculopathy   3. Back pain at L4-L5 level    Pain Score: Pre-procedure: 9 /10 Post-procedure: 4 /10   Pre-op H&P Assessment:  Christine Higgins is a 66 y.o. (year old), female patient, seen today for interventional treatment. She  has a past surgical history that includes carpel tunn (Right); Hand surgery (Right); carpel tunnel (Left); Cesarean section; Shoulder surgery (Right); Back surgery; Neck surgery; Total hip arthroplasty (Right); Excision Morton's neuroma (Left, 02/05/2017); Colonoscopy; Joint replacement; Esophagogastroduodenoscopy (egd) with propofol (N/A, 06/26/2017); Knee Arthroplasty (Left, 08/11/2017); Knee arthroscopy (Right, 07/02/2018); Reverse shoulder arthroplasty (Left, 06/24/2019); Thoracic  laminectomy for spinal cord stimulator (N/A, 08/07/2020); Colonoscopy with propofol (N/A, 10/20/2020); Cardiac catheterization; Tubal ligation; Knee Arthroplasty (Right, 02/05/2021); and Spinal cord stimulator removal (N/A, 04/09/2021). Christine Higgins has a current medication list which includes the following prescription(s): acetaminophen, vitamin c, atorvastatin, vitamin a, budesonide-formoterol, calcium carbonate, celecoxib, vitamin d3, ciclopirox, clobetasol, clonazepam, coq-10, vitamin b-12, diphenhydramine, duloxetine, duloxetine, esomeprazole, fexofenadine, fluticasone, gabapentin, hydralazine, ketoconazole, linaclotide, losartan-hydrochlorothiazide, methimazole, methocarbamol, multiple vitamin, probiotic product, propranolol, sucralfate, terbinafine, trazodone, triamcinolone cream, turmeric, vitamin e, and tramadol. Her primarily concern today is the Back Pain (lower)  Initial Vital Signs:  Pulse/HCG Rate: 69ECG Heart Rate: 72 Temp: (!) 97.1 F (36.2 C) Resp: 18 BP: 130/78 SpO2: 95 %  BMI: Estimated body mass index is 40.32 kg/m as calculated from the following:   Height as of this encounter: '5\' 9"'$  (1.753 m).   Weight as of this encounter: 273 lb (123.8 kg).  Risk Assessment: Allergies: Reviewed. She is allergic to cephalexin, ibuprofen, potassium chloride, shellfish allergy, and aspirin.  Allergy Precautions: None required Coagulopathies: Reviewed. None identified.  Blood-thinner therapy: None at this time Active Infection(s): Reviewed. None identified. Christine Higgins is afebrile  Site Confirmation: Christine Higgins was asked to confirm the procedure and laterality before marking the site Procedure checklist: Completed Consent: Before the procedure and under the influence of no sedative(s), amnesic(s), or anxiolytics, the patient was informed of the treatment options, risks and possible complications. To fulfill our ethical and legal obligations, as recommended by the American Medical Association's Code  of Ethics, I have informed the patient of my clinical impression; the nature and purpose of the treatment or procedure; the risks, benefits, and possible complications of the intervention; the alternatives, including doing nothing; the risk(s) and benefit(s) of the alternative treatment(s) or procedure(s);  and the risk(s) and benefit(s) of doing nothing. The patient was provided information about the general risks and possible complications associated with the procedure. These may include, but are not limited to: failure to achieve desired goals, infection, bleeding, organ or nerve damage, allergic reactions, paralysis, and death. In addition, the patient was informed of those risks and complications associated to Spine-related procedures, such as failure to decrease pain; infection (i.e.: Meningitis, epidural or intraspinal abscess); bleeding (i.e.: epidural hematoma, subarachnoid hemorrhage, or any other type of intraspinal or peri-dural bleeding); organ or nerve damage (i.e.: Any type of peripheral nerve, nerve root, or spinal cord injury) with subsequent damage to sensory, motor, and/or autonomic systems, resulting in permanent pain, numbness, and/or weakness of one or several areas of the body; allergic reactions; (i.e.: anaphylactic reaction); and/or death. Furthermore, the patient was informed of those risks and complications associated with the medications. These include, but are not limited to: allergic reactions (i.e.: anaphylactic or anaphylactoid reaction(s)); adrenal axis suppression; blood sugar elevation that in diabetics may result in ketoacidosis or comma; water retention that in patients with history of congestive heart failure may result in shortness of breath, pulmonary edema, and decompensation with resultant heart failure; weight gain; swelling or edema; medication-induced neural toxicity; particulate matter embolism and blood vessel occlusion with resultant organ, and/or nervous system  infarction; and/or aseptic necrosis of one or more joints. Finally, the patient was informed that Medicine is not an exact science; therefore, there is also the possibility of unforeseen or unpredictable risks and/or possible complications that may result in a catastrophic outcome. The patient indicated having understood very clearly. We have given the patient no guarantees and we have made no promises. Enough time was given to the patient to ask questions, all of which were answered to the patient's satisfaction. Ms. Dickerman has indicated that she wanted to continue with the procedure. Attestation: I, the ordering provider, attest that I have discussed with the patient the benefits, risks, side-effects, alternatives, likelihood of achieving goals, and potential problems during recovery for the procedure that I have provided informed consent. Date  Time: 01/28/2022 10:58 AM  Pre-Procedure Preparation:  Monitoring: As per clinic protocol. Respiration, ETCO2, SpO2, BP, heart rate and rhythm monitor placed and checked for adequate function Safety Precautions: Patient was assessed for positional comfort and pressure points before starting the procedure. Time-out: I initiated and conducted the "Time-out" before starting the procedure, as per protocol. The patient was asked to participate by confirming the accuracy of the "Time Out" information. Verification of the correct person, site, and procedure were performed and confirmed by me, the nursing staff, and the patient. "Time-out" conducted as per Joint Commission's Universal Protocol (UP.01.01.01). Time: 1206  Description of Procedure:          Target Area: Caudal Epidural Canal. Approach: Midline approach. Area Prepped: Entire Posterior Sacrococcygeal Region DuraPrep (Iodine Povacrylex [0.7% available iodine] and Isopropyl Alcohol, 74% w/w) Safety Precautions: Aspiration looking for blood return was conducted prior to all injections. At no point did we  inject any substances, as a needle was being advanced. No attempts were made at seeking any paresthesias. Safe injection practices and needle disposal techniques used. Medications properly checked for expiration dates. SDV (single dose vial) medications used. Description of the Procedure: Protocol guidelines were followed. The patient was placed in position over the fluoroscopy table. The target area was identified and the area prepped in the usual manner. Skin & deeper tissues infiltrated with local anesthetic. Appropriate amount of time allowed  to pass for local anesthetics to take effect. The procedure needles were then advanced to the target area. Proper needle placement secured. Negative aspiration confirmed. Solution injected in intermittent fashion, asking for systemic symptoms every 0.5cc of injectate. The needles were then removed and the area cleansed, making sure to leave some of the prepping solution back to take advantage of its long term bactericidal properties. Vitals:   01/28/22 1129 01/28/22 1206 01/28/22 1210  BP: 130/78 (!) 156/110 (!) 148/82  Pulse: 69    Resp:  18 16  Temp: (!) 97.1 F (36.2 C)    TempSrc:  Oral   SpO2: 95% 96% 95%  Weight: 273 lb (123.8 kg)    Height: '5\' 9"'$  (1.753 m)      Start Time: 1206 hrs. End Time: 1209 hrs. Materials:  Needle(s) Type: Epidural needle Gauge: 22G Length: 3.5-in Medication(s): Please see orders for medications and dosing details. 6 cc solution made of 3 cc of preservative-free saline, 2 cc of 0.2% ropivacaine, 1 cc of Decadron 10 mg/cc.  Imaging Guidance (Spinal):          Type of Imaging Technique: Fluoroscopy Guidance (Spinal) Indication(s): Assistance in needle guidance and placement for procedures requiring needle placement in or near specific anatomical locations not easily accessible without such assistance. Exposure Time: Please see nurses notes. Contrast: Before injecting any contrast, we confirmed that the patient did not  have an allergy to iodine, shellfish, or radiological contrast. Once satisfactory needle placement was completed at the desired level, radiological contrast was injected. Contrast injected under live fluoroscopy. No contrast complications. See chart for type and volume of contrast used. Fluoroscopic Guidance: I was personally present during the use of fluoroscopy. "Tunnel Vision Technique" used to obtain the best possible view of the target area. Parallax error corrected before commencing the procedure. "Direction-depth-direction" technique used to introduce the needle under continuous pulsed fluoroscopy. Once target was reached, antero-posterior, oblique, and lateral fluoroscopic projection used confirm needle placement in all planes. Images permanently stored in EMR. Interpretation: I personally interpreted the imaging intraoperatively. Adequate needle placement confirmed in multiple planes. Appropriate spread of contrast into desired area was observed. No evidence of afferent or efferent intravascular uptake. No intrathecal or subarachnoid spread observed. Permanent images saved into the patient's record.  Post-operative Assessment:  Post-procedure Vital Signs:  Pulse/HCG Rate: 6975 Temp: (!) 97.1 F (36.2 C) Resp: 16 BP: (!) 148/82 SpO2: 95 %  EBL: None  Complications: No immediate post-treatment complications observed by team, or reported by patient.  Note: The patient tolerated the entire procedure well. A repeat set of vitals were taken after the procedure and the patient was kept under observation following institutional policy, for this type of procedure. Post-procedural neurological assessment was performed, showing return to baseline, prior to discharge. The patient was provided with post-procedure discharge instructions, including a section on how to identify potential problems. Should any problems arise concerning this procedure, the patient was given instructions to immediately contact  us, at any time, without hesitation. In any case, we plan to contact the patient by telephone for a follow-up status report regarding this interventional procedure.  Comments:  No additional relevant information.  Plan of Care  Orders:  Orders Placed This Encounter  Procedures   DG PAIN CLIN[facility] C-ARM 1-60 MIN NO REPORT    Intraoperative interpretation by procedural physician at [facility].    Standing Status:   Standing    Number of Occurrences:   1    Order Specific  Question:   Reason for exam:    Answer:   Assistance in needle guidance and placement for procedures requiring needle placement in or near specific anatomical locations not easily accessible without such assistance.   Medications ordered for procedure: Meds ordered this encounter  Medications   iohexol (OMNIPAQUE) 180 MG/ML injection 10 mL    Must be Myelogram-compatible. If not available, you may substitute with a water-soluble, non-ionic, hypoallergenic, myelogram-compatible radiological contrast medium.   lidocaine (XYLOCAINE) 2 % (with pres) injection 400 mg   sodium chloride flush (NS) 0.9 % injection 2 mL   ropivacaine (PF) 2 mg/mL (0.2%) (NAROPIN) injection 2 mL   dexamethasone (DECADRON) injection 10 mg   Medications administered: We administered iohexol, lidocaine, sodium chloride flush, ropivacaine (PF) 2 mg/mL (0.2%), and dexamethasone.  See the medical record for exact dosing, route, and time of administration.  Follow-up plan:   Return in about 5 weeks (around 03/04/2022) for Post Procedure Evaluation, virtual.    Recent Visits Date Type Provider Dept  01/15/22 Office Visit [name], MD [facility]  11/28/21 Procedure visit [name], MD [facility]  11/15/21 Office Visit [name], MD [facility]  Showing recent visits within past 90 days and meeting all other requirements Today's Visits Date Type Provider Dept  01/28/22 Procedure visit [name], MD [facility]  Showing today's visits and meeting all other requirements Future Appointments Date Type Provider Dept  02/12/22 Appointment [name], MD [facility]  03/06/22 Appointment [name], MD [facility]  Showing future appointments within next 90 days and meeting all other requirements  Disposition: Discharge home  Discharge (Date  Time): 01/28/2022; 1220 hrs.   Primary Care Physician: Kirk Ruths, MD Location: [facility] Note by: [name], MD Date: 01/28/2022; Time: 1:16 PM  Disclaimer:  Medicine is not an exact science. The only guarantee in medicine is that nothing is guaranteed. It is important to note that the decision to proceed with this intervention was based on the information collected from the patient. The Data and conclusions were drawn from the patient's questionnaire, the interview, and the physical examination. Because the information was provided in large part by the patient, it cannot be guaranteed that it has not been purposely or unconsciously manipulated. Every effort has been made to obtain as much relevant data as possible for this evaluation. It is important to note that the conclusions that lead to this procedure are derived in large part from the available data. Always take into account that the treatment will also be dependent on availability of resources and existing treatment guidelines, considered by other Pain Management Practitioners as being common knowledge and practice, at the time of the intervention. For Medico-Legal purposes, it is also important to point out that variation in procedural techniques and pharmacological choices are the acceptable norm. The indications, contraindications, technique, and results of the above procedure should only be interpreted and judged by a Board-Certified Interventional Pain Specialist with extensive familiarity and expertise in the  same exact procedure and technique.

## 2022-01-28 NOTE — Progress Notes (Signed)
Safety precautions to be maintained throughout the outpatient stay will include: orient to surroundings, keep bed in low position, maintain call bell within reach at all times, provide assistance with transfer out of bed and ambulation.  

## 2022-01-28 NOTE — Patient Instructions (Signed)
Pain Management Discharge Instructions  General Discharge Instructions :  If you need to reach your doctor call: Monday-Friday 8:00 am - 4:00 pm at [phone] or toll free [phone].  After clinic hours [phone] to have operator reach doctor.  Bring all of your medication bottles to all your appointments in the pain clinic.  To cancel or reschedule your appointment with Pain Management please remember to call 24 hours in advance to avoid a fee.  Refer to the educational materials which you have been given on: General Risks, I had my Procedure. Discharge Instructions, Post Sedation.  Post Procedure Instructions:  The drugs you were given will stay in your system until tomorrow, so for the next 24 hours you should not drive, make any legal decisions or drink any alcoholic beverages.  You may eat anything you prefer, but it is better to start with liquids then soups and crackers, and gradually work up to solid foods.  Please notify your doctor immediately if you have any unusual bleeding, trouble breathing or pain that is not related to your normal pain.  Depending on the type of procedure that was done, some parts of your body may feel week and/or numb.  This usually clears up by tonight or the next day.  Walk with the use of an assistive device or accompanied by an adult for the 24 hours.  You may use ice on the affected area for the first 24 hours.  Put ice in a Ziploc bag and cover with a towel and place against area 15 minutes on 15 minutes off.  You may switch to heat after 24 hours.Epidural Steroid Injection Patient Information  Description: The epidural space surrounds the nerves as they exit the spinal cord.  In some patients, the nerves can be compressed and inflamed by a bulging disc or a tight spinal canal (spinal stenosis).  By injecting steroids into the epidural space, we can bring irritated nerves into direct contact with a potentially helpful medication.  These  steroids act directly on the irritated nerves and can reduce swelling and inflammation which often leads to decreased pain.  Epidural steroids may be injected anywhere along the spine and from the neck to the low back depending upon the location of your pain.   After numbing the skin with local anesthetic (like Novocaine), a small needle is passed into the epidural space slowly.  You may experience a sensation of pressure while this is being done.  The entire block usually last less than 10 minutes.  Conditions which may be treated by epidural steroids:  Low back and leg pain Neck and arm pain Spinal stenosis Post-laminectomy syndrome Herpes zoster (shingles) pain Pain from compression fractures  Preparation for the injection:  Do not eat any solid food or dairy products within 8 hours of your appointment.  You may drink clear liquids up to 3 hours before appointment.  Clear liquids include water, black coffee, juice or soda.  No milk or cream please. You may take your regular medication, including pain medications, with a sip of water before your appointment  Diabetics should hold regular insulin (if taken separately) and take 1/2 normal NPH dos the morning of the procedure.  Carry some sugar containing items with you to your appointment. A driver must accompany you and be prepared to drive you home after your procedure.  Bring all your current medications with your. An IV may be inserted and sedation may be given at the discretion of the physician.     A blood pressure cuff, EKG and other monitors will often be applied during the procedure.  Some patients may need to have extra oxygen administered for a short period. You will be asked to provide medical information, including your allergies, prior to the procedure.  We must know immediately if you are taking blood thinners (like Coumadin/Warfarin)  Or if you are allergic to IV iodine contrast (dye). We must know if you could possible be  pregnant.  Possible side-effects: Bleeding from needle site Infection (rare, may require surgery) Nerve injury (rare) Numbness & tingling (temporary) Difficulty urinating (rare, temporary) Spinal headache ( a headache worse with upright posture) Light -headedness (temporary) Pain at injection site (several days) Decreased blood pressure (temporary) Weakness in arm/leg (temporary) Pressure sensation in back/neck (temporary)  Call if you experience: Fever/chills associated with headache or increased back/neck pain. Headache worsened by an upright position. New onset weakness or numbness of an extremity below the injection site Hives or difficulty breathing (go to the emergency room) Inflammation or drainage at the infection site Severe back/neck pain Any new symptoms which are concerning to you  Please note:  Although the local anesthetic injected can often make your back or neck feel good for several hours after the injection, the pain will likely return.  It takes 3-7 days for steroids to work in the epidural space.  You may not notice any pain relief for at least that one week.  If effective, we will often do a series of three injections spaced 3-6 weeks apart to maximally decrease your pain.  After the initial series, we generally will wait several months before considering a repeat injection of the same type.  If you have any questions, please call [phone] [facility] 

## 2022-01-29 ENCOUNTER — Telehealth: Payer: Self-pay | Admitting: Student in an Organized Health Care Education/Training Program

## 2022-01-29 ENCOUNTER — Telehealth: Payer: Self-pay | Admitting: *Deleted

## 2022-01-29 NOTE — Telephone Encounter (Signed)
Patient called stated that she is feeling fine today. Patient will give call back if pain worst. Thanks

## 2022-01-29 NOTE — Telephone Encounter (Signed)
Attempted to call for post procedure follow-up. "Call cannot be completed at this time."

## 2022-01-31 DIAGNOSIS — R0600 Dyspnea, unspecified: Secondary | ICD-10-CM | POA: Diagnosis not present

## 2022-02-05 DIAGNOSIS — M85859 Other specified disorders of bone density and structure, unspecified thigh: Secondary | ICD-10-CM | POA: Diagnosis not present

## 2022-02-05 DIAGNOSIS — Z0100 Encounter for examination of eyes and vision without abnormal findings: Secondary | ICD-10-CM | POA: Diagnosis not present

## 2022-02-06 DIAGNOSIS — E118 Type 2 diabetes mellitus with unspecified complications: Secondary | ICD-10-CM | POA: Diagnosis not present

## 2022-02-06 DIAGNOSIS — Z6837 Body mass index (BMI) 37.0-37.9, adult: Secondary | ICD-10-CM | POA: Diagnosis not present

## 2022-02-06 DIAGNOSIS — J4521 Mild intermittent asthma with (acute) exacerbation: Secondary | ICD-10-CM | POA: Diagnosis not present

## 2022-02-06 DIAGNOSIS — L4050 Arthropathic psoriasis, unspecified: Secondary | ICD-10-CM | POA: Diagnosis not present

## 2022-02-06 DIAGNOSIS — I10 Essential (primary) hypertension: Secondary | ICD-10-CM | POA: Diagnosis not present

## 2022-02-06 DIAGNOSIS — F325 Major depressive disorder, single episode, in full remission: Secondary | ICD-10-CM | POA: Diagnosis not present

## 2022-02-06 DIAGNOSIS — I700 Atherosclerosis of aorta: Secondary | ICD-10-CM | POA: Diagnosis not present

## 2022-02-06 DIAGNOSIS — N950 Postmenopausal bleeding: Secondary | ICD-10-CM | POA: Diagnosis not present

## 2022-02-12 ENCOUNTER — Encounter: Payer: Medicare HMO | Admitting: Student in an Organized Health Care Education/Training Program

## 2022-02-12 ENCOUNTER — Telehealth: Payer: Self-pay | Admitting: Student in an Organized Health Care Education/Training Program

## 2022-02-12 NOTE — Telephone Encounter (Signed)
Patient will be out of medications 02-13-22. Please relay message to Dr Holley Raring tomorrow. She is one of the ones we had to move

## 2022-02-13 ENCOUNTER — Other Ambulatory Visit: Payer: Self-pay | Admitting: *Deleted

## 2022-02-13 MED ORDER — TRAMADOL HCL 50 MG PO TABS: 50.0000 mg | ORAL_TABLET | Freq: Four times a day (QID) | ORAL | 2 refills | Status: AC | PRN

## 2022-02-13 NOTE — Telephone Encounter (Signed)
Rx request sent to Dr. Lateef 

## 2022-02-19 ENCOUNTER — Ambulatory Visit: Payer: Medicare HMO | Admitting: Dermatology

## 2022-02-19 ENCOUNTER — Other Ambulatory Visit: Payer: Self-pay | Admitting: Dermatology

## 2022-02-19 DIAGNOSIS — R21 Rash and other nonspecific skin eruption: Secondary | ICD-10-CM

## 2022-02-21 ENCOUNTER — Ambulatory Visit
Payer: Medicare HMO | Attending: Student in an Organized Health Care Education/Training Program | Admitting: Student in an Organized Health Care Education/Training Program

## 2022-02-21 ENCOUNTER — Encounter: Payer: Self-pay | Admitting: Student in an Organized Health Care Education/Training Program

## 2022-02-21 VITALS — BP 151/80 | HR 80 | Temp 97.6°F | Resp 18 | Ht 68.0 in | Wt 262.0 lb

## 2022-02-21 DIAGNOSIS — G5703 Lesion of sciatic nerve, bilateral lower limbs: Secondary | ICD-10-CM

## 2022-02-21 DIAGNOSIS — M1612 Unilateral primary osteoarthritis, left hip: Secondary | ICD-10-CM

## 2022-02-21 DIAGNOSIS — M5416 Radiculopathy, lumbar region: Secondary | ICD-10-CM | POA: Diagnosis not present

## 2022-02-21 DIAGNOSIS — M47818 Spondylosis without myelopathy or radiculopathy, sacral and sacrococcygeal region: Secondary | ICD-10-CM | POA: Insufficient documentation

## 2022-02-21 DIAGNOSIS — G894 Chronic pain syndrome: Secondary | ICD-10-CM

## 2022-02-21 DIAGNOSIS — Z9889 Other specified postprocedural states: Secondary | ICD-10-CM | POA: Diagnosis not present

## 2022-02-21 DIAGNOSIS — M461 Sacroiliitis, not elsewhere classified: Secondary | ICD-10-CM

## 2022-02-21 DIAGNOSIS — M961 Postlaminectomy syndrome, not elsewhere classified: Secondary | ICD-10-CM | POA: Diagnosis not present

## 2022-02-21 DIAGNOSIS — M545 Low back pain, unspecified: Secondary | ICD-10-CM

## 2022-02-21 NOTE — Progress Notes (Signed)
Nursing Pain Medication Assessment:  Safety precautions to be maintained throughout the outpatient stay will include: orient to surroundings, keep bed in low position, maintain call bell within reach at all times, provide assistance with transfer out of bed and ambulation.  Medication Inspection Compliance: Christine Higgins did not comply with our request to bring her pills to be counted. She was reminded that bringing the medication bottles, even when empty, is a requirement.  Medication: None brought in. Pill/Patch Count: None available to be counted. Bottle Appearance: No container available. Did not bring bottle(s) to appointment. Filled Date: N/A Last Medication intake:  Today Patient is given meds from nursing staff

## 2022-02-21 NOTE — Progress Notes (Signed)
PROVIDER NOTE: Information contained herein reflects review and annotations entered in association with encounter. Interpretation of such information and data should be left to medically-trained personnel. Information provided to patient can be located elsewhere in the medical record under "Patient Instructions". Document created using STT-dictation technology, any transcriptional errors that may result from process are unintentional.    Patient: Christine Higgins  Service Category: E/M  Provider: [name], MD  DOB: 09/09/1955  DOS: 02/21/2022  Specialty: Interventional Pain Management  MRN: [id number]  Setting: Ambulatory outpatient  PCP: Kirk Ruths, MD  Type: Established Patient    Referring Provider: Kirk Ruths, MD  Location: Office  Delivery: Face-to-face     HPI  [name], a 66 y.o. year old female, is here today because of her Lumbar radiculopathy [M54.16]. [name] primary complain today is Back Pain, Neck Pain, and Shoulder Pain Last encounter: My last encounter with her was on 02/12/2022. Pertinent problems: Christine Higgins has Failed back surgical syndrome; DDD (degenerative disc disease), lumbosacral; Severe obesity (BMI 35.0-35.9 with comorbidity) ([place]); Primary osteoarthritis of right knee; DM II (diabetes mellitus, type II), controlled ([place]); Chronic pain syndrome; Fibromyalgia; and Lumbar radiculopathy on their pertinent problem list. Pain Assessment: Severity of Chronic pain is reported as a 9 /10. Location: Back Lower/radiates down both legs, toes numb at times. Onset: More than a month ago. Quality: Aching, Burning, Numbness. Timing: Constant. Modifying factor(s): sitting and lying. Vitals:  height is $RemoveB'5\' 8"'avvYoYZm$  (1.727 m) and weight is 262 lb (118.8 kg). Her temperature is 97.6 F (36.4 C). Her blood pressure is 151/80 (abnormal) and her pulse is 80. Her respiration is 18 and oxygen saturation is 99%.   Reason for encounter: evaluation of worsening, or previously  known (established) problem.  Patient continues to have persistent low back and leg pain and weakness related to multilevel lumbar spinal stenosis.  She had only 3 days of pain relief with her previous caudal ESI.  I do not recommend any additional caudal injections for the next 3 to 4 months to limit steroid exposure.  Of note patient is status post spinal cord stimulator trial, implant and then subsequent explant due to refractory pain.  She is wondering if a surgical consultation would be helpful because she is wondering if she needs to have surgery.  Her previous neurosurgeon, Dr. Lacinda Axon who did her spinal cord stimulator implant has relocated back to [facility].  She is requesting to see Dr. Newman Pies.  I have instructed her to continue her tramadol for pain management as prescribed.  Follow-up as needed for medication management.  Pharmacotherapy Assessment  Analgesic: Tramadol 100 mg twice daily, quantity 120/month MME equals 20   Monitoring: [place] PMP: PDMP reviewed during this encounter.       Pharmacotherapy: No side-effects or adverse reactions reported. Compliance: No problems identified. Effectiveness: Clinically acceptable.  Dewayne Shorter, RN  02/21/2022  1:51 PM  Sign when Signing Visit Nursing Pain Medication Assessment:  Safety precautions to be maintained throughout the outpatient stay will include: orient to surroundings, keep bed in low position, maintain call bell within reach at all times, provide assistance with transfer out of bed and ambulation.  Medication Inspection Compliance: Christine Higgins did not comply with our request to bring her pills to be counted. She was reminded that bringing the medication bottles, even when empty, is a requirement.  Medication: None brought in. Pill/Patch Count: None available to be counted. Bottle Appearance: No container available. Did not bring  bottle(s) to appointment. Filled Date: N/A Last Medication intake:  Today Patient is given meds from  nursing staff   UDS:  Summary  Date Value Ref Range Status  11/15/2021 Note  Final    Comment:    ==================================================================== [employer] Select 13 (MW) ==================================================================== Test                             Result       Flag       Units  Drug Present and Declared for Prescription Verification   7-aminoclonazepam              42           EXPECTED   ng/mg creat    7-aminoclonazepam is an expected metabolite of clonazepam. Source of    clonazepam is a scheduled prescription medication.    Tramadol                       >6944        EXPECTED   ng/mg creat   O-Desmethyltramadol            5594         EXPECTED   ng/mg creat   N-Desmethyltramadol            >6944        EXPECTED   ng/mg creat    Source of tramadol is a prescription medication. O-desmethyltramadol    and N-desmethyltramadol are expected metabolites of tramadol.  ==================================================================== Test                      Result    Flag   Units      Ref Range   Creatinine              72               mg/dL      >=20 ==================================================================== Declared Medications:  The flagging and interpretation on this report are based on the  following declared medications.  Unexpected results may arise from  inaccuracies in the declared medications.   **Note: The testing scope of this panel includes these medications:   Clonazepam (Klonopin)  Tramadol (Ultram)   **Note: The testing scope of this panel does not include the  following reported medications:   Acetaminophen (Tylenol)  Atorvastatin (Lipitor)  Budesonide (Symbicort)  Calcium  Celecoxib (Celebrex)  Ciclopirox (Loprox)  Clobetasol (Temovate)  Diphenhydramine (Benadryl)  Duloxetine (Cymbalta)  Esomeprazole (Nexium)  Fexofenadine (Allegra)  Fluticasone (Flonase)  Formoterol (Symbicort)  Gabapentin  (Neurontin)  Hydralazine (Apresoline)  Hydrochlorothiazide (Hyzaar)  Ketoconazole (Nizoral)  Linaclotide (Linzess)  Losartan (Hyzaar)  Methimazole (Tapazole)  Methocarbamol (Robaxin)  Multivitamin  Probiotic  Propranolol (Inderal)  Sucralfate (Carafate)  Terbinafine (Lamisil)  Trazodone (Desyrel)  Triamcinolone (Kenalog)  Turmeric  Ubiquinone (CoQ10)  Vitamin A  Vitamin B12  Vitamin C  Vitamin D3  Vitamin E ==================================================================== For clinical consultation, please call [phone]. ====================================================================      ROS  Constitutional: Denies any fever or chills Gastrointestinal: No reported hemesis, hematochezia, vomiting, or acute GI distress Musculoskeletal:  Low back, bilateral leg pain and weakness Neurological: No reported episodes of acute onset apraxia, aphasia, dysarthria, agnosia, amnesia, paralysis, loss of coordination, or loss of consciousness  Medication Review  CoQ-10, DULoxetine, Multiple Vitamin, Probiotic Product, Turmeric, Vitamin B-12, Vitamin D3, Vitamin E, acetaminophen, atorvastatin, budesonide-formoterol, calcium carbonate, celecoxib, ciclopirox,  clobetasol, clonazePAM, diphenhydrAMINE, esomeprazole, fexofenadine, fluticasone, gabapentin, hydrALAZINE, ketoconazole, linaclotide, losartan-hydrochlorothiazide, methimazole, methocarbamol, propranolol, sucralfate, terbinafine, traMADol, traZODone, triamcinolone cream, vitamin A, and vitamin C  History Review  Allergy: Ms. Brierley is allergic to cephalexin, ibuprofen, potassium chloride, shellfish allergy, and aspirin. Drug: Ms. Orsborn  reports that she does not currently use drugs. Alcohol:  reports no history of alcohol use. Tobacco:  reports that she quit smoking about 29 years ago. Her smoking use included cigarettes. She has never used smokeless tobacco. Social: Ms. Dobos  reports that she quit smoking about 29 years  ago. Her smoking use included cigarettes. She has never used smokeless tobacco. She reports that she does not currently use drugs. She reports that she does not drink alcohol. Medical:  has a past medical history of Anxiety, Asthma, Breast mass, Cervical disc disease, Chronic pain syndrome, COPD (chronic obstructive pulmonary disease) (HCC), Degenerative disc disease, lumbar, Depression, Fibromyalgia, Fibromyalgia, GERD (gastroesophageal reflux disease), Glaucoma, Graves disease, Headache, Hemorrhoids, Hyperlipidemia, Hypertension, Hyperthyroidism, Lumbar disc disease, Obesity, Pre-diabetes, Psoriatic arthritis ([place]), and Thyroid disease. Surgical: Ms. Coburn  has a past surgical history that includes carpel tunn (Right); Hand surgery (Right); carpel tunnel (Left); Cesarean section; Shoulder surgery (Right); Back surgery; Neck surgery; Total hip arthroplasty (Right); Excision Morton's neuroma (Left, 02/05/2017); Colonoscopy; Joint replacement; Esophagogastroduodenoscopy (egd) with propofol (N/A, 06/26/2017); Knee Arthroplasty (Left, 08/11/2017); Knee arthroscopy (Right, 07/02/2018); Reverse shoulder arthroplasty (Left, 06/24/2019); Thoracic laminectomy for spinal cord stimulator (N/A, 08/07/2020); Colonoscopy with propofol (N/A, 10/20/2020); Cardiac catheterization; Tubal ligation; Knee Arthroplasty (Right, 02/05/2021); and Spinal cord stimulator removal (N/A, 04/09/2021). Family: family history includes Cancer in her mother; Heart disease in her father.  Laboratory Chemistry Profile   Renal Lab Results  Component Value Date   BUN 7 (L) 04/04/2021   CREATININE 0.77 04/04/2021   BCR 11 (L) 04/27/2020   GFRAA 83 04/27/2020   GFRNONAA >60 04/04/2021    Hepatic Lab Results  Component Value Date   AST 21 01/25/2021   ALT 20 01/25/2021   ALBUMIN 4.3 01/25/2021   ALKPHOS 81 01/25/2021   LIPASE 27 12/14/2016    Electrolytes Lab Results  Component Value Date   NA 139 04/04/2021   K 4.3 04/04/2021   CL  102 04/04/2021   CALCIUM 9.7 04/04/2021   MG 1.9 08/27/2014    Bone No results found for: "VD25OH", "VD125OH2TOT", "XJ8832PQ9", "IY6415AX0", "25OHVITD1", "25OHVITD2", "25OHVITD3", "TESTOFREE", "TESTOSTERONE"  Inflammation (CRP: Acute Phase) (ESR: Chronic Phase) Lab Results  Component Value Date   CRP 0.5 01/25/2021   ESRSEDRATE 6 01/25/2021         Note: Above Lab results reviewed.  Recent Imaging Review  DG PAIN CLINIC C-ARM 1-60 MIN NO REPORT Fluoro was used, but no Radiologist interpretation will be provided.  Please refer to "NOTES" tab for provider progress note. Note: Reviewed        Physical Exam  General appearance: Well nourished, well developed, and well hydrated. In no apparent acute distress Mental status: Alert, oriented x 3 (person, place, & time)       Respiratory: No evidence of acute respiratory distress Eyes: PERLA Vitals: BP (!) 151/80   Pulse 80   Temp 97.6 F (36.4 C)   Resp 18   Ht $R'5\' 8"'vJ$  (1.727 m)   Wt 262 lb (118.8 kg)   SpO2 99%   BMI 39.84 kg/m  BMI: Estimated body mass index is 39.84 kg/m as calculated from the following:   Height as of this encounter: $RemoveBeforeD'5\' 8"'hQBzTfBPRtPLge$  (1.727 m).  Weight as of this encounter: 262 lb (118.8 kg). Ideal: Ideal body weight: 63.9 kg (140 lb 14 oz) Adjusted ideal body weight: 85.9 kg (189 lb 5.2 oz)  Lumbar Spine Area Exam  Skin & Axial Inspection: Well healed scar from previous spine surgery detected Alignment: Symmetrical Functional ROM: Pain restricted ROM affecting both sides Stability: No instability detected Muscle Tone/Strength: Functionally intact. No obvious neuro-muscular anomalies detected. Sensory (Neurological): Dermatomal pain pattern   Gait & Posture Assessment  Ambulation: Patient came in today in a wheel chair Gait: Modified gait pattern (slower gait speed, wider stride width, and longer stance duration) associated with morbid obesity Posture: Difficulty standing up straight, due to pain      Lower  Extremity Exam      Side: Right lower extremity   Side: Left lower extremity  Stability: No instability observed           Stability: No instability observed          Skin & Extremity Inspection: Skin color, temperature, and hair growth are WNL. No peripheral edema or cyanosis. No masses, redness, swelling, asymmetry, or associated skin lesions. No contractures.   Skin & Extremity Inspection: Skin color, temperature, and hair growth are WNL. No peripheral edema or cyanosis. No masses, redness, swelling, asymmetry, or associated skin lesions. No contractures.  Functional ROM: Pain restricted for the hip and knee joint                 Functional ROM: Pain restricted ROM for hip and knee joints          Muscle Tone/Strength: Functionally intact. No obvious neuro-muscular anomalies detected.   Muscle Tone/Strength: Functionally intact. No obvious neuro-muscular anomalies detected.  Sensory (Neurological): Neurogenic, arthropathic   Sensory (Neurological): Dermatomal pain pattern        DTR: Patellar: deferred today Achilles: deferred today Plantar: deferred today   DTR: Patellar: deferred today Achilles: deferred today Plantar: deferred today  Palpation: No palpable anomalies   Palpation: No palpable anomalies        Assessment   Diagnosis Status  1. Lumbar radiculopathy   2. Failed back surgical syndrome   3. Back pain at L4-L5 level   4. History of lumbar laminectomy for spinal cord decompression   5. Primary osteoarthritis of left hip   6. Piriformis syndrome of both sides   7. Arthritis of sacroiliac joint of both sides   8. Chronic pain syndrome    Controlled Controlled Controlled    Plan of Care    Orders:  Orders Placed This Encounter  Procedures   Ambulatory referral to Neurosurgery    Referral Priority:   Routine    Referral Type:   Surgical    Referral Reason:   Specialty Services Required    Referred to Provider:   Newman Pies, MD    Requested Specialty:    Neurosurgery    Number of Visits Requested:   1   Follow-up plan:   Return if symptoms worsen or fail to improve.    Recent Visits Date Type Provider Dept  01/28/22 Procedure visit [name], MD [facility]  01/15/22 Office Visit [name], MD [facility]  11/28/21 Procedure visit [name], MD [facility]  Showing recent visits within past 90 days and meeting all other requirements Today's Visits Date Type Provider Dept  02/21/22 Office Visit [name], MD [facility]  Showing today's visits and meeting all other requirements Future Appointments No visits were  found meeting these conditions. Showing future appointments within next 90 days and meeting all other requirements  I discussed the assessment and treatment plan with the patient. The patient was provided an opportunity to ask questions and all were answered. The patient agreed with the plan and demonstrated an understanding of the instructions.  Patient advised to call back or seek an in-person evaluation if the symptoms or condition worsens.  Duration of encounter: 44minutes.  Total time on encounter, as per AMA guidelines included both the face-to-face and non-face-to-face time personally spent by the physician and/or other qualified health care professional(s) on the day of the encounter (includes time in activities that require the physician or other qualified health care professional and does not include time in activities normally performed by clinical staff). Physician's time may include the following activities when performed: preparing to see the patient (eg, review of tests, pre-charting review of records) obtaining and/or reviewing separately obtained history performing a medically appropriate examination and/or evaluation counseling and educating the patient/family/caregiver ordering medications, tests, or procedures referring and communicating with other  health care professionals (when not separately reported) documenting clinical information in the electronic or other health record independently interpreting results (not separately reported) and communicating results to the patient/ family/caregiver care coordination (not separately reported)  Note by: [name], MD Date: 02/21/2022; Time: 2:35 PM

## 2022-02-27 ENCOUNTER — Telehealth: Payer: Self-pay | Admitting: Student in an Organized Health Care Education/Training Program

## 2022-02-27 NOTE — Telephone Encounter (Signed)
Called patient back and informed her that she should be getting Tramadol every 6 hours for pain. Patient with understanding. She states that her medication is in a pill pack and was not sure. Patient with understanding.

## 2022-02-27 NOTE — Telephone Encounter (Signed)
Patient wants to know if her meds were changed. Patient stated that the nurses are giving her 1 pill 2 times a day. Please give patient a call. Thanks

## 2022-02-28 ENCOUNTER — Ambulatory Visit: Payer: Medicare HMO | Admitting: Dermatology

## 2022-02-28 DIAGNOSIS — E0500 Thyrotoxicosis with diffuse goiter without thyrotoxic crisis or storm: Secondary | ICD-10-CM | POA: Diagnosis not present

## 2022-03-06 ENCOUNTER — Telehealth: Payer: Medicare HMO | Admitting: Student in an Organized Health Care Education/Training Program

## 2022-03-06 DIAGNOSIS — R102 Pelvic and perineal pain: Secondary | ICD-10-CM | POA: Diagnosis not present

## 2022-03-06 DIAGNOSIS — N950 Postmenopausal bleeding: Secondary | ICD-10-CM | POA: Diagnosis not present

## 2022-03-06 DIAGNOSIS — M810 Age-related osteoporosis without current pathological fracture: Secondary | ICD-10-CM | POA: Diagnosis not present

## 2022-03-06 DIAGNOSIS — N898 Other specified noninflammatory disorders of vagina: Secondary | ICD-10-CM | POA: Diagnosis not present

## 2022-03-07 DIAGNOSIS — E0500 Thyrotoxicosis with diffuse goiter without thyrotoxic crisis or storm: Secondary | ICD-10-CM | POA: Diagnosis not present

## 2022-03-07 DIAGNOSIS — E042 Nontoxic multinodular goiter: Secondary | ICD-10-CM | POA: Diagnosis not present

## 2022-03-07 DIAGNOSIS — I10 Essential (primary) hypertension: Secondary | ICD-10-CM | POA: Diagnosis not present

## 2022-03-07 DIAGNOSIS — R7303 Prediabetes: Secondary | ICD-10-CM | POA: Diagnosis not present

## 2022-03-18 DIAGNOSIS — R0600 Dyspnea, unspecified: Secondary | ICD-10-CM | POA: Diagnosis not present

## 2022-03-21 DIAGNOSIS — G5603 Carpal tunnel syndrome, bilateral upper limbs: Secondary | ICD-10-CM | POA: Diagnosis not present

## 2022-03-21 DIAGNOSIS — M542 Cervicalgia: Secondary | ICD-10-CM | POA: Diagnosis not present

## 2022-03-21 DIAGNOSIS — I10 Essential (primary) hypertension: Secondary | ICD-10-CM | POA: Diagnosis not present

## 2022-03-21 DIAGNOSIS — G608 Other hereditary and idiopathic neuropathies: Secondary | ICD-10-CM | POA: Diagnosis not present

## 2022-03-21 DIAGNOSIS — M797 Fibromyalgia: Secondary | ICD-10-CM | POA: Diagnosis not present

## 2022-03-21 DIAGNOSIS — R208 Other disturbances of skin sensation: Secondary | ICD-10-CM | POA: Diagnosis not present

## 2022-03-26 DIAGNOSIS — I10 Essential (primary) hypertension: Secondary | ICD-10-CM | POA: Diagnosis not present

## 2022-03-26 DIAGNOSIS — R9389 Abnormal findings on diagnostic imaging of other specified body structures: Secondary | ICD-10-CM | POA: Diagnosis not present

## 2022-03-26 DIAGNOSIS — E118 Type 2 diabetes mellitus with unspecified complications: Secondary | ICD-10-CM | POA: Diagnosis not present

## 2022-03-26 DIAGNOSIS — N882 Stricture and stenosis of cervix uteri: Secondary | ICD-10-CM | POA: Diagnosis not present

## 2022-03-26 DIAGNOSIS — R102 Pelvic and perineal pain: Secondary | ICD-10-CM | POA: Diagnosis not present

## 2022-03-26 DIAGNOSIS — Z96641 Presence of right artificial hip joint: Secondary | ICD-10-CM | POA: Diagnosis not present

## 2022-03-26 DIAGNOSIS — Z96653 Presence of artificial knee joint, bilateral: Secondary | ICD-10-CM | POA: Diagnosis not present

## 2022-04-05 ENCOUNTER — Encounter (INDEPENDENT_AMBULATORY_CARE_PROVIDER_SITE_OTHER): Payer: Medicare HMO | Admitting: Nurse Practitioner

## 2022-04-10 DIAGNOSIS — L4050 Arthropathic psoriasis, unspecified: Secondary | ICD-10-CM | POA: Diagnosis not present

## 2022-04-18 ENCOUNTER — Encounter (INDEPENDENT_AMBULATORY_CARE_PROVIDER_SITE_OTHER): Payer: Medicare HMO | Admitting: Nurse Practitioner

## 2022-04-19 DIAGNOSIS — Z6839 Body mass index (BMI) 39.0-39.9, adult: Secondary | ICD-10-CM | POA: Diagnosis not present

## 2022-04-19 DIAGNOSIS — M961 Postlaminectomy syndrome, not elsewhere classified: Secondary | ICD-10-CM | POA: Diagnosis not present

## 2022-04-23 DIAGNOSIS — I700 Atherosclerosis of aorta: Secondary | ICD-10-CM | POA: Diagnosis not present

## 2022-04-23 DIAGNOSIS — E7800 Pure hypercholesterolemia, unspecified: Secondary | ICD-10-CM | POA: Diagnosis not present

## 2022-04-23 DIAGNOSIS — R350 Frequency of micturition: Secondary | ICD-10-CM | POA: Diagnosis not present

## 2022-04-23 DIAGNOSIS — Z0000 Encounter for general adult medical examination without abnormal findings: Secondary | ICD-10-CM | POA: Diagnosis not present

## 2022-04-23 DIAGNOSIS — I10 Essential (primary) hypertension: Secondary | ICD-10-CM | POA: Diagnosis not present

## 2022-04-23 DIAGNOSIS — E118 Type 2 diabetes mellitus with unspecified complications: Secondary | ICD-10-CM | POA: Diagnosis not present

## 2022-04-23 DIAGNOSIS — Z23 Encounter for immunization: Secondary | ICD-10-CM | POA: Diagnosis not present

## 2022-04-23 DIAGNOSIS — F325 Major depressive disorder, single episode, in full remission: Secondary | ICD-10-CM | POA: Diagnosis not present

## 2022-05-03 ENCOUNTER — Other Ambulatory Visit: Payer: Self-pay | Admitting: Dermatology

## 2022-05-03 DIAGNOSIS — L219 Seborrheic dermatitis, unspecified: Secondary | ICD-10-CM

## 2022-05-06 ENCOUNTER — Other Ambulatory Visit: Payer: Self-pay | Admitting: Neurosurgery

## 2022-05-06 ENCOUNTER — Other Ambulatory Visit: Payer: Self-pay | Admitting: Obstetrics and Gynecology

## 2022-05-06 DIAGNOSIS — M961 Postlaminectomy syndrome, not elsewhere classified: Secondary | ICD-10-CM

## 2022-05-08 ENCOUNTER — Encounter: Payer: Self-pay | Admitting: Urology

## 2022-05-08 ENCOUNTER — Ambulatory Visit (INDEPENDENT_AMBULATORY_CARE_PROVIDER_SITE_OTHER): Payer: Medicare HMO | Admitting: Urology

## 2022-05-08 VITALS — BP 179/100 | HR 86 | Ht 68.0 in | Wt 271.0 lb

## 2022-05-08 DIAGNOSIS — N3941 Urge incontinence: Secondary | ICD-10-CM | POA: Diagnosis not present

## 2022-05-08 DIAGNOSIS — N3281 Overactive bladder: Secondary | ICD-10-CM | POA: Diagnosis not present

## 2022-05-08 DIAGNOSIS — R32 Unspecified urinary incontinence: Secondary | ICD-10-CM | POA: Diagnosis not present

## 2022-05-08 LAB — MICROSCOPIC EXAMINATION: Bacteria, UA: NONE SEEN

## 2022-05-08 LAB — URINALYSIS, COMPLETE
Bilirubin, UA: NEGATIVE
Glucose, UA: NEGATIVE
Ketones, UA: NEGATIVE
Leukocytes,UA: NEGATIVE
Nitrite, UA: NEGATIVE
Protein,UA: NEGATIVE
RBC, UA: NEGATIVE
Specific Gravity, UA: <1.005 — ABNORMAL LOW (ref 1.005–1.030)
Urobilinogen, Ur: 0.2 mg/dL (ref 0.2–1.0)
pH, UA: 6.5 (ref 5.0–7.5)

## 2022-05-08 LAB — BLADDER SCAN AMB NON-IMAGING: Scan Result: 130

## 2022-05-09 ENCOUNTER — Encounter: Payer: Self-pay | Admitting: Urology

## 2022-05-09 MED ORDER — TAMSULOSIN HCL 0.4 MG PO CAPS: 0.4000 mg | ORAL_CAPSULE | Freq: Every day | ORAL | 0 refills | Status: DC

## 2022-05-09 NOTE — Progress Notes (Signed)
05/08/2022 7:12 AM   Christine Higgins [birth year] [id number]  Referring provider: Kirk Ruths, MD [facility] [place],  [place] [address]  Chief Complaint  Patient presents with   Urinary Incontinence    HPI: 66 y.o. female presents for reestablished patient visit.  Initially seen October 2018 with complaints of pelvic pain and frequency, urgency with urge incontinence. History of lumbar disc disease and spinal stenosis and her pelvic pain was suspicious for neuropathic etiology.  She was started on Solifenacin Last visit February 2019 for cystoscopy.  Her voiding symptoms had significantly improved on Solifenacin.  Cystoscopy was unremarkable. She is no longer taking Solifenacin and current complaints include urinary hesitancy, sensation of incomplete bladder emptying, pelvic pain at the end of urination, low back pain.  She also has frequency, urgency and urge incontinence. Denies dysuria, gross hematuria or recurrent UTI No bowel symptoms    PMH: Past Medical History:  Diagnosis Date   Anxiety    Asthma    Breast mass    LEFT x 3 months per pt and palpated by physician   Cervical disc disease    Chronic pain syndrome    COPD (chronic obstructive pulmonary disease) (HCC)    Degenerative disc disease, lumbar    osteoarthritis   Depression    Fibromyalgia    Fibromyalgia    GERD (gastroesophageal reflux disease)    Glaucoma    Graves disease    Headache    migraines   Hemorrhoids    Hyperlipidemia    Hypertension    Hyperthyroidism    Lumbar disc disease    Obesity    Pre-diabetes    Psoriatic arthritis ([place])    Thyroid disease     Surgical History: Past Surgical History:  Procedure Laterality Date   BACK SURGERY     sumbar   CARDIAC CATHETERIZATION     carpel tunn Right    carpel tunnel Left    CESAREAN SECTION     COLONOSCOPY     COLONOSCOPY WITH PROPOFOL N/A 10/20/2020   Procedure: COLONOSCOPY WITH PROPOFOL;   Surgeon: Lesly Rubenstein, MD;  Location: [facility] ENDOSCOPY;  Service: Endoscopy;  Laterality: N/A;   ESOPHAGOGASTRODUODENOSCOPY (EGD) WITH PROPOFOL N/A 06/26/2017   Procedure: ESOPHAGOGASTRODUODENOSCOPY (EGD) WITH PROPOFOL;  Surgeon: Lollie Sails, MD;  Location: [facility] ENDOSCOPY;  Service: Endoscopy;  Laterality: N/A;   EXCISION MORTON'S NEUROMA Left 02/05/2017   Procedure: EXCISION MORTON'S NEUROMA;  Surgeon: Samara Deist, DPM;  Location: [facility];  Service: Podiatry;  Laterality: Left;  iva with local   HAND SURGERY Right    scar tissue removal   JOINT REPLACEMENT     right hip arthroplasty 08/25/15   KNEE ARTHROPLASTY Left 08/11/2017   Procedure: COMPUTER ASSISTED TOTAL KNEE ARTHROPLASTY;  Surgeon: Dereck Leep, MD;  Location: [facility];  Service: Orthopedics;  Laterality: Left;   KNEE ARTHROPLASTY Right 02/05/2021   Procedure: COMPUTER ASSISTED TOTAL KNEE ARTHROPLASTY;  Surgeon: Dereck Leep, MD;  Location: [facility];  Service: Orthopedics;  Laterality: Right;   KNEE ARTHROSCOPY Right 07/02/2018   Procedure: ARTHROSCOPY KNEE, Medial and Lateral  Chondroplasty;  Surgeon: Dereck Leep, MD;  Location: [facility];  Service: Orthopedics;  Laterality: Right;   NECK SURGERY     "disk implant"   REVERSE SHOULDER ARTHROPLASTY Left 06/24/2019   Procedure: REVERSE SHOULDER ARTHROPLASTY;  Surgeon: Corky Mull, MD;  Location: [facility];  Service: Orthopedics;  Laterality: Left;  SHOULDER SURGERY Right    spur removal   SPINAL CORD STIMULATOR REMOVAL N/A 04/09/2021   Procedure: REMOVAL SPINAL CORD STIMULATOR & PULSE GENERATOR;  Surgeon: [name], MD;  Location: [facility];  Service: Neurosurgery;  Laterality: N/A;  2nd case   THORACIC LAMINECTOMY FOR SPINAL CORD STIMULATOR N/A 08/07/2020   Procedure: THORACIC SPINAL CORD STIMULATOR VIA LAMINECTOMY, PULSE GENERATOR;  Surgeon: [name], MD;  Location: [facility];  Service: Neurosurgery;  Laterality: N/A;   TOTAL HIP ARTHROPLASTY  Right    TUBAL LIGATION      Home Medications:  Allergies as of 05/08/2022       Reactions   Cephalexin Hives   Ibuprofen Nausea And Vomiting   Flu like symptoms   Potassium Chloride Itching   Shellfish Allergy Nausea And Vomiting   Aspirin Nausea And Vomiting        Medication List        Accurate as of May 08, 2022 11:59 PM. If you have any questions, ask your nurse or doctor.          acetaminophen 500 MG tablet Commonly known as: TYLENOL Take 2 tablets (1,000 mg total) by mouth every 6 (six) hours.   ANTI-OXIDANT PO Take by mouth every other day.   atorvastatin 10 MG tablet Commonly known as: LIPITOR Take 10 mg by mouth daily after supper.   budesonide-formoterol 160-4.5 MCG/ACT inhaler Commonly known as: SYMBICORT Inhale 2 puffs into the lungs 2 (two) times daily.   calcium carbonate 600 MG Tabs tablet Commonly known as: OS-CAL Take 1 tablet by mouth 2 (two) times a week.   celecoxib 200 MG capsule Commonly known as: CELEBREX Take 200 mg by mouth 2 (two) times daily.   ciclopirox 0.77 % cream Commonly known as: LOPROX Apply to feet, between toes, and around toenails once to twice daily.   clobetasol 0.05 % external solution Commonly known as: TEMOVATE Apply 1-2 drops to affected area scalp as needed for itch. Avoid face, groin, axilla.   clonazePAM 1 MG tablet Commonly known as: KLONOPIN Take 1 tablet (1 mg total) by mouth at bedtime as needed (for sleep/anxiety).   CoQ-10 200 MG Caps Take 2 capsules by mouth every other day.   diphenhydrAMINE 25 MG tablet Commonly known as: BENADRYL Take 25 mg by mouth at bedtime as needed.   DULoxetine 60 MG capsule Commonly known as: CYMBALTA Take 60 mg by mouth every evening.   DULoxetine 30 MG capsule Commonly known as: CYMBALTA Take 30 mg by mouth in the morning.   esomeprazole 40 MG capsule Commonly known as: NEXIUM Take 40 mg by mouth 2 (two) times daily.   fexofenadine 180 MG  tablet Commonly known as: ALLEGRA Take 180 mg by mouth daily as needed.   fluticasone 50 MCG/ACT nasal spray Commonly known as: FLONASE Place 1 spray into both nostrils daily.   gabapentin 300 MG capsule Commonly known as: NEURONTIN Take 1 capsule (300 mg total) by mouth 3 (three) times daily.   hydrALAZINE 25 MG tablet Commonly known as: APRESOLINE Take 25 mg by mouth 3 (three) times daily as needed (blood pressure 160/100 or higher).   ketoconazole 2 % shampoo Commonly known as: NIZORAL MASSAGE INTO SCALP AND LET SIT 10-15 MINS BEFORE RINSING. USE 2X/WEEK.   linaclotide 290 MCG Caps capsule Commonly known as: LINZESS Take 290 mcg by mouth daily as needed (constipation).   losartan-hydrochlorothiazide 100-25 MG tablet Commonly known as: HYZAAR Take 1 tablet by mouth daily.  methimazole 10 MG tablet Commonly known as: TAPAZOLE Take 10 mg by mouth daily.   methocarbamol 750 MG tablet Commonly known as: ROBAXIN Take 1 tablet (750 mg total) by mouth every 8 (eight) hours as needed for muscle spasms.   PROBIOTIC PO Take 1 capsule by mouth every other day.   propranolol 40 MG tablet Commonly known as: INDERAL Take 40 mg by mouth 2 (two) times daily.   sucralfate 1 g tablet Commonly known as: CARAFATE Take 1 g by mouth 2 (two) times daily.   terbinafine 250 MG tablet Commonly known as: LamISIL Take 1 tablet (250 mg total) by mouth daily. Take with food.   traMADol 50 MG tablet Commonly known as: ULTRAM Take 1 tablet (50 mg total) by mouth every 6 (six) hours as needed for severe pain.   traZODone 150 MG tablet Commonly known as: DESYREL Take 75 mg by mouth at bedtime as needed for sleep.   triamcinolone cream 0.1 % Commonly known as: KENALOG Apply to affected area rash on abdomen 1-2 times a day as needed for itch. Avoid face, groin, axilla.   TURMERIC PO Take 1,000 mg by mouth every other day.   vitamin A 25000 UNIT capsule Take 25,000 Units by mouth 2  (two) times a week.   Vitamin B-12 5000 MCG Tbdp Take 5,000 mcg by mouth every other day.   vitamin C 1000 MG tablet Take 1,000 mg by mouth daily.   Vitamin D3 25 MCG (1000 UT) Caps Take 2,000 Units by mouth every evening.   VITAMIN E PO Take 400 Units by mouth every other day.        Allergies:  Allergies  Allergen Reactions   Cephalexin Hives   Ibuprofen Nausea And Vomiting    Flu like symptoms   Potassium Chloride Itching   Shellfish Allergy Nausea And Vomiting   Aspirin Nausea And Vomiting    Family History: Family History  Problem Relation Age of Onset   Cancer Mother    Heart disease Father    Breast cancer Neg Hx     Social History:  reports that she quit smoking about 29 years ago. Her smoking use included cigarettes. She has never used smokeless tobacco. She reports that she does not currently use drugs. She reports that she does not drink alcohol.   Physical Exam: BP (!) 179/100   Pulse 86   Ht '5\' 8"'$  (1.727 m)   Wt 271 lb (122.9 kg)   BMI 41.21 kg/m   Constitutional:  Alert, No acute distress. HEENT: [place] AT Respiratory: Normal respiratory effort, no increased work of breathing. Psychiatric: Normal mood and affect.  Laboratory Data:  Urinalysis Dipstick/microscopy negative   Assessment & Plan:   66 y.o. female with a previous history of overactive bladder symptoms improved on Solifenacin. Presently with storage related and obstructive voiding symptoms. PVR today was 130 mL Will not restart Solifenacin based on her PVR today. Trial tamsulosin 0.4 mg daily.  PA follow-up 1 month for repeat symptom check and PVR If still symptomatic recommend a beta 3 agonist trial   Christine Sons, MD  [facility] [address] [place], [place] [address] [phone]

## 2022-05-14 ENCOUNTER — Encounter (INDEPENDENT_AMBULATORY_CARE_PROVIDER_SITE_OTHER): Payer: Self-pay | Admitting: Nurse Practitioner

## 2022-05-14 ENCOUNTER — Ambulatory Visit (INDEPENDENT_AMBULATORY_CARE_PROVIDER_SITE_OTHER): Payer: Medicare HMO | Admitting: Nurse Practitioner

## 2022-05-14 VITALS — BP 197/92 | HR 77 | Resp 18 | Ht 71.0 in | Wt 277.4 lb

## 2022-05-14 DIAGNOSIS — M79604 Pain in right leg: Secondary | ICD-10-CM | POA: Diagnosis not present

## 2022-05-14 DIAGNOSIS — M7989 Other specified soft tissue disorders: Secondary | ICD-10-CM

## 2022-05-14 DIAGNOSIS — M79605 Pain in left leg: Secondary | ICD-10-CM | POA: Diagnosis not present

## 2022-05-14 DIAGNOSIS — M47818 Spondylosis without myelopathy or radiculopathy, sacral and sacrococcygeal region: Secondary | ICD-10-CM

## 2022-05-14 NOTE — Progress Notes (Signed)
Subjective:    Patient ID: Christine Higgins, female    DOB: August 12, 1956, 66 y.o.   MRN: [id number] Chief Complaint  Patient presents with   Establish Care    Referred by Dr Ouida Sills    Patient is seen for evaluation of leg swelling. The patient first noticed the swelling remotely but is now concerned because of a significant increase in the overall edema. The swelling isn't associated with significant pain.  There has been an increasing amount of  discoloration noted by the patient. The patient notes that in the morning the legs are improved but they steadily worsened throughout the course of the day. Elevation seems to make the swelling of the legs better, dependency makes them much worse.   There is no history of ulcerations associated with the swelling.   The patient denies any recent changes in their medications.  The patient has not been wearing graduated compression.  The patient has no had any past angiography, interventions or vascular surgery.  The patient denies a history of DVT or PE. There is no prior history of phlebitis. There is no history of primary lymphedema.  There is no history of radiation treatment to the groin or pelvis No history of malignancies. No history of trauma or groin or pelvic surgery. No history of foreign travel or parasitic infections area       Review of Systems  Cardiovascular:  Positive for leg swelling.  Musculoskeletal:  Positive for back pain and gait problem.  All other systems reviewed and are negative.      Objective:   Physical Exam Vitals reviewed.  HENT:     Head: Normocephalic.  Cardiovascular:     Rate and Rhythm: Normal rate.     Pulses:          Dorsalis pedis pulses are 2+ on the right side and 2+ on the left side.       Posterior tibial pulses are 1+ on the right side and 1+ on the left side.  Pulmonary:     Effort: Pulmonary effort is normal.  Skin:    General: Skin is warm and dry.  Neurological:     Mental  Status: She is alert and oriented to person, place, and time.     Gait: Gait abnormal.  Psychiatric:        Mood and Affect: Mood normal.        Behavior: Behavior normal.        Thought Content: Thought content normal.        Judgment: Judgment normal.     BP (!) 197/92 (BP Location: Right Arm)   Pulse 77   Resp 18   Ht '5\' 11"'$  (1.803 m)   Wt 277 lb 6.4 oz (125.8 kg)   BMI 38.69 kg/m   Past Medical History:  Diagnosis Date   Anxiety    Asthma    Breast mass    LEFT x 3 months per pt and palpated by physician   Cervical disc disease    Chronic pain syndrome    COPD (chronic obstructive pulmonary disease) (HCC)    Degenerative disc disease, lumbar    osteoarthritis   Depression    Fibromyalgia    Fibromyalgia    GERD (gastroesophageal reflux disease)    Glaucoma    Graves disease    Headache    migraines   Hemorrhoids    Hyperlipidemia    Hypertension    Hyperthyroidism    Lumbar disc disease  Obesity    Pre-diabetes    Psoriatic arthritis ([place])    Thyroid disease     Social History   Socioeconomic History   Marital status: Single    Spouse name: Not on file   Number of children: Not on file   Years of education: Not on file   Highest education level: Not on file  Occupational History   Not on file  Tobacco Use   Smoking status: Former    Types: Cigarettes    Quit date: 22    Years since quitting: 29.7   Smokeless tobacco: Never  Vaping Use   Vaping Use: Never used  Substance and Sexual Activity   Alcohol use: No    Alcohol/week: 0.0 standard drinks of alcohol   Drug use: Not Currently    Comment: 1991   Sexual activity: Not Currently    Birth control/protection: None  Other Topics Concern   Not on file  Social History Narrative   Live in assisted living   Social Determinants of Health   Financial Resource Strain: Not on file  Food Insecurity: Not on file  Transportation Needs: Not on file  Physical Activity: Not on file  Stress:  Not on file  Social Connections: Not on file  Intimate Partner Violence: Not on file    Past Surgical History:  Procedure Laterality Date   BACK SURGERY     sumbar   CARDIAC CATHETERIZATION     carpel tunn Right    carpel tunnel Left    CESAREAN SECTION     COLONOSCOPY     COLONOSCOPY WITH PROPOFOL N/A 10/20/2020   Procedure: COLONOSCOPY WITH PROPOFOL;  Surgeon: Lesly Rubenstein, MD;  Location: [facility];  Service: Endoscopy;  Laterality: N/A;   ESOPHAGOGASTRODUODENOSCOPY (EGD) WITH PROPOFOL N/A 06/26/2017   Procedure: ESOPHAGOGASTRODUODENOSCOPY (EGD) WITH PROPOFOL;  Surgeon: Lollie Sails, MD;  Location: [facility] ENDOSCOPY;  Service: Endoscopy;  Laterality: N/A;   EXCISION MORTON'S NEUROMA Left 02/05/2017   Procedure: EXCISION MORTON'S NEUROMA;  Surgeon: Samara Deist, DPM;  Location: [place];  Service: Podiatry;  Laterality: Left;  iva with local   HAND SURGERY Right    scar tissue removal   JOINT REPLACEMENT     right hip arthroplasty 08/25/15   KNEE ARTHROPLASTY Left 08/11/2017   Procedure: COMPUTER ASSISTED TOTAL KNEE ARTHROPLASTY;  Surgeon: Dereck Leep, MD;  Location: [facility];  Service: Orthopedics;  Laterality: Left;   KNEE ARTHROPLASTY Right 02/05/2021   Procedure: COMPUTER ASSISTED TOTAL KNEE ARTHROPLASTY;  Surgeon: Dereck Leep, MD;  Location: [facility];  Service: Orthopedics;  Laterality: Right;   KNEE ARTHROSCOPY Right 07/02/2018   Procedure: ARTHROSCOPY KNEE, Medial and Lateral  Chondroplasty;  Surgeon: Dereck Leep, MD;  Location: [facility];  Service: Orthopedics;  Laterality: Right;   NECK SURGERY     "disk implant"   REVERSE SHOULDER ARTHROPLASTY Left 06/24/2019   Procedure: REVERSE SHOULDER ARTHROPLASTY;  Surgeon: Corky Mull, MD;  Location: [facility];  Service: Orthopedics;  Laterality: Left;   SHOULDER SURGERY Right    spur removal   SPINAL CORD STIMULATOR REMOVAL N/A 04/09/2021   Procedure: REMOVAL SPINAL CORD STIMULATOR & PULSE  GENERATOR;  Surgeon: [name], MD;  Location: [facility];  Service: Neurosurgery;  Laterality: N/A;  2nd case   THORACIC LAMINECTOMY FOR SPINAL CORD STIMULATOR N/A 08/07/2020   Procedure: THORACIC SPINAL CORD STIMULATOR VIA LAMINECTOMY, PULSE GENERATOR;  Surgeon: [name], MD;  Location: [facility];  Service: Neurosurgery;  Laterality:  N/A;   TOTAL HIP ARTHROPLASTY Right    TUBAL LIGATION      Family History  Problem Relation Age of Onset   Cancer Mother    Heart disease Father    Breast cancer Neg Hx     Allergies  Allergen Reactions   Cephalexin Hives   Ibuprofen Nausea And Vomiting    Flu like symptoms   Potassium Chloride Itching   Shellfish Allergy Nausea And Vomiting   Aspirin Nausea And Vomiting       Latest Ref Rng & Units 04/04/2021    2:22 PM 02/15/2021    1:18 AM 02/07/2021    8:22 AM  CBC  WBC 4.0 - 10.5 K/uL 3.8  5.9  5.5   Hemoglobin 12.0 - 15.0 g/dL 10.5  7.9  8.1   Hematocrit 36.0 - 46.0 % 33.4  24.9  24.0   Platelets 150 - 400 K/uL 219  335  140       CMP     Component Value Date/Time   NA 139 04/04/2021 1422   NA 137 04/27/2020 1338   NA 132 (L) 08/26/2014 0458   K 4.3 04/04/2021 1422   K 3.8 08/28/2014 0359   CL 102 04/04/2021 1422   CL 95 (L) 08/26/2014 0458   CO2 30 04/04/2021 1422   CO2 31 08/26/2014 0458   GLUCOSE 106 (H) 04/04/2021 1422   GLUCOSE 155 (H) 08/26/2014 0458   BUN 7 (L) 04/04/2021 1422   BUN 9 04/27/2020 1338   BUN 5 (L) 08/26/2014 0458   CREATININE 0.77 04/04/2021 1422   CREATININE 0.74 08/26/2014 0458   CALCIUM 9.7 04/04/2021 1422   CALCIUM 8.6 08/26/2014 0458   PROT 6.9 01/25/2021 1150   PROT 6.9 04/27/2020 1338   ALBUMIN 4.3 01/25/2021 1150   ALBUMIN 4.7 04/27/2020 1338   AST 21 01/25/2021 1150   ALT 20 01/25/2021 1150   ALKPHOS 81 01/25/2021 1150   BILITOT 0.7 01/25/2021 1150   BILITOT 0.8 04/27/2020 1338   GFRNONAA >60 04/04/2021 1422   GFRNONAA >60 08/26/2014 0458   GFRAA 83 04/27/2020 1338   GFRAA >60  08/26/2014 0458     No results found.     Assessment & Plan:   1. Pain in both lower extremities  Recommend:  The patient has atypical pain symptoms for pure atherosclerotic disease. However, on physical exam there is evidence of vascular disease, given the diminished pulses and the edema associated with venous changes of the legs.  Further investigation of the patient's vascular disease is necessary to determine the relationship of the patient's lower extremity symptoms and the degree of vascular disease.  Noninvasive studies of the will be obtained and the patient will follow up with me to review these studies.  I suspect the patient is c/o pseudoclaudication.  Patient should have an evaluation of his LS spine which I defer to either the primary service or the Spine service.  The patient should continue walking and begin a more formal exercise program. The patient should continue his antiplatelet therapy and aggressive treatment of the lipid abnormalities.   2. Leg swelling Recommend:  I have had a long discussion with the patient regarding swelling and why it  causes symptoms.  Patient will begin wearing graduated compression on a daily basis. The patient will  wear the stockings first thing in the morning and removing them in the evening. The patient is instructed specifically not to sleep in the stockings.   In addition, behavioral  modification will be initiated.  This will include frequent elevation, use of over the counter pain medications and exercise such as walking.  Consideration for a lymph pump will also be made based upon the effectiveness of conservative therapy.  This would help to improve the edema control and prevent sequela such as ulcers and infections   Patient should undergo duplex ultrasound of the venous system to ensure that DVT or reflux is not present.  The patient will follow-up with me after the ultrasound.    3. Arthritis of sacroiliac joint of both  sides The patient notes radiating pain on her left side.  Based upon the radiation of the pain I suspect this is related to her lower back.  She has a significant history of multiple lower back intervention.   Current Outpatient Medications on File Prior to Visit  Medication Sig Dispense Refill   acetaminophen (TYLENOL) 500 MG tablet Take 2 tablets (1,000 mg total) by mouth every 6 (six) hours. 30 tablet 0   albuterol (VENTOLIN HFA) 108 (90 Base) MCG/ACT inhaler Inhale into the lungs.     Ascorbic Acid (VITAMIN C) 1000 MG tablet Take 1,000 mg by mouth daily.      atorvastatin (LIPITOR) 10 MG tablet Take 10 mg by mouth daily after supper.      Beta Carotene (VITAMIN A) 25000 UNIT capsule Take 25,000 Units by mouth 2 (two) times a week.     budesonide-formoterol (SYMBICORT) 160-4.5 MCG/ACT inhaler Inhale 2 puffs into the lungs 2 (two) times daily.     calcium carbonate (OS-CAL) 600 MG TABS tablet Take 1 tablet by mouth 2 (two) times a week.      celecoxib (CELEBREX) 200 MG capsule Take 200 mg by mouth 2 (two) times daily.     Cholecalciferol (VITAMIN D3) 25 MCG (1000 UT) CAPS Take 2,000 Units by mouth every evening.      clobetasol (TEMOVATE) 0.05 % external solution Apply 1-2 drops to affected area scalp as needed for itch. Avoid face, groin, axilla. 50 mL 1   clonazePAM (KLONOPIN) 1 MG tablet Take 1 tablet (1 mg total) by mouth at bedtime as needed (for sleep/anxiety). 14 tablet 0   Coenzyme Q10 (COQ-10) 200 MG CAPS Take 2 capsules by mouth every other day.      Cyanocobalamin (VITAMIN B-12) 5000 MCG TBDP Take 5,000 mcg by mouth every other day.      diphenhydrAMINE (BENADRYL) 25 MG tablet Take 25 mg by mouth at bedtime as needed.     DULoxetine (CYMBALTA) 30 MG capsule Take 30 mg by mouth in the morning.      DULoxetine (CYMBALTA) 60 MG capsule Take 60 mg by mouth every evening.      esomeprazole (NEXIUM) 40 MG capsule Take 40 mg by mouth 2 (two) times daily.      estradiol (ESTRACE) 0.1  MG/GM vaginal cream Place vaginally.     fexofenadine (ALLEGRA) 180 MG tablet Take 180 mg by mouth daily as needed.     fluticasone (FLONASE) 50 MCG/ACT nasal spray Place 1 spray into both nostrils daily.      gabapentin (NEURONTIN) 300 MG capsule Take 1 capsule (300 mg total) by mouth 3 (three) times daily. 90 capsule 5   Golimumab (SIMPONI ARIA IV) Inject into the vein.     hydrALAZINE (APRESOLINE) 25 MG tablet Take 25 mg by mouth 3 (three) times daily as needed (blood pressure 160/100 or higher).      ibandronate (BONIVA) 150 MG tablet Take  by mouth.     ketoconazole (NIZORAL) 2 % shampoo MASSAGE INTO SCALP AND LET SIT 10-15 MINS BEFORE RINSING. USE 2X/WEEK. 120 mL 1   linaclotide (LINZESS) 290 MCG CAPS capsule Take 290 mcg by mouth daily as needed (constipation).      losartan (COZAAR) 100 MG tablet Take 100 mg by mouth daily.     losartan-hydrochlorothiazide (HYZAAR) 100-25 MG tablet Take 1 tablet by mouth daily.     methimazole (TAPAZOLE) 10 MG tablet Take 10 mg by mouth daily.      methocarbamol (ROBAXIN) 750 MG tablet Take 1 tablet (750 mg total) by mouth every 8 (eight) hours as needed for muscle spasms. 90 tablet 11   Multiple Vitamin (ANTI-OXIDANT PO) Take by mouth every other day.     potassium chloride (KLOR-CON) 10 MEQ tablet Take 10 mEq by mouth daily.     propranolol (INDERAL) 40 MG tablet Take 40 mg by mouth 2 (two) times daily.  11   RESTASIS 0.05 % ophthalmic emulsion      sucralfate (CARAFATE) 1 g tablet Take 1 g by mouth 2 (two) times daily.     tamsulosin (FLOMAX) 0.4 MG CAPS capsule Take 1 capsule (0.4 mg total) by mouth daily after breakfast. 30 capsule 0   torsemide (DEMADEX) 10 MG tablet Take 10 mg by mouth daily.     traZODone (DESYREL) 150 MG tablet Take 75 mg by mouth at bedtime as needed for sleep.      triamcinolone cream (KENALOG) 0.1 % Apply to affected area rash on abdomen 1-2 times a day as needed for itch. Avoid face, groin, axilla. 453.6 g 2   VITAMIN E PO  Take 400 Units by mouth every other day.      ciclopirox (LOPROX) 0.77 % cream Apply to feet, between toes, and around toenails once to twice daily. (Patient not taking: Reported on 05/14/2022) 90 g 1   Probiotic Product (PROBIOTIC PO) Take 1 capsule by mouth every other day.  (Patient not taking: Reported on 05/14/2022)     terbinafine (LAMISIL) 250 MG tablet Take 1 tablet (250 mg total) by mouth daily. Take with food. (Patient not taking: Reported on 05/14/2022) 30 tablet 1   TURMERIC PO Take 1,000 mg by mouth every other day.  (Patient not taking: Reported on 05/14/2022)     No current facility-administered medications on file prior to visit.    There are no Patient Instructions on file for this visit. No follow-ups on file.   Kris Hartmann, NP

## 2022-05-15 ENCOUNTER — Ambulatory Visit
Admission: RE | Admit: 2022-05-15 | Discharge: 2022-05-15 | Disposition: A | Discharge status: Home / Self Care | Payer: Medicare HMO | Source: Ambulatory Visit | Attending: Neurosurgery | Admitting: Neurosurgery

## 2022-05-15 ENCOUNTER — Encounter (INDEPENDENT_AMBULATORY_CARE_PROVIDER_SITE_OTHER): Payer: Self-pay | Admitting: Nurse Practitioner

## 2022-05-15 DIAGNOSIS — M48061 Spinal stenosis, lumbar region without neurogenic claudication: Secondary | ICD-10-CM | POA: Diagnosis not present

## 2022-05-15 DIAGNOSIS — M961 Postlaminectomy syndrome, not elsewhere classified: Secondary | ICD-10-CM

## 2022-05-15 DIAGNOSIS — M5126 Other intervertebral disc displacement, lumbar region: Secondary | ICD-10-CM | POA: Diagnosis not present

## 2022-05-15 DIAGNOSIS — M4316 Spondylolisthesis, lumbar region: Secondary | ICD-10-CM | POA: Diagnosis not present

## 2022-05-15 MED ORDER — ONDANSETRON HCL 4 MG/2ML IJ SOLN: 4.0000 mg | Freq: Once | INTRAMUSCULAR | Status: DC | PRN

## 2022-05-15 MED ORDER — IOPAMIDOL (ISOVUE-M 200) INJECTION 41%
20.0000 mL | Freq: Once | INTRAMUSCULAR | Status: AC
  Administered 2022-05-15: 20 mL via INTRATHECAL

## 2022-05-15 MED ORDER — MEPERIDINE HCL 50 MG/ML IJ SOLN: 50.0000 mg | Freq: Once | INTRAMUSCULAR | Status: DC | PRN

## 2022-05-15 MED ORDER — DIAZEPAM 5 MG PO TABS
5.0000 mg | ORAL_TABLET | Freq: Once | ORAL | Status: AC
  Administered 2022-05-15: 5 mg via ORAL

## 2022-05-15 NOTE — Discharge Instructions (Signed)

## 2022-05-16 ENCOUNTER — Ambulatory Visit: Payer: Medicare HMO | Admitting: Dermatology

## 2022-05-22 ENCOUNTER — Other Ambulatory Visit: Payer: Self-pay | Admitting: Obstetrics and Gynecology

## 2022-05-22 DIAGNOSIS — Z1231 Encounter for screening mammogram for malignant neoplasm of breast: Secondary | ICD-10-CM | POA: Diagnosis not present

## 2022-05-22 DIAGNOSIS — D229 Melanocytic nevi, unspecified: Secondary | ICD-10-CM | POA: Diagnosis not present

## 2022-05-22 DIAGNOSIS — J449 Chronic obstructive pulmonary disease, unspecified: Secondary | ICD-10-CM | POA: Diagnosis not present

## 2022-05-22 DIAGNOSIS — K219 Gastro-esophageal reflux disease without esophagitis: Secondary | ICD-10-CM | POA: Diagnosis not present

## 2022-05-22 DIAGNOSIS — Z6835 Body mass index (BMI) 35.0-35.9, adult: Secondary | ICD-10-CM | POA: Diagnosis not present

## 2022-05-22 DIAGNOSIS — L723 Sebaceous cyst: Secondary | ICD-10-CM | POA: Diagnosis not present

## 2022-05-22 DIAGNOSIS — J301 Allergic rhinitis due to pollen: Secondary | ICD-10-CM | POA: Diagnosis not present

## 2022-05-22 DIAGNOSIS — G479 Sleep disorder, unspecified: Secondary | ICD-10-CM | POA: Diagnosis not present

## 2022-05-23 ENCOUNTER — Other Ambulatory Visit: Payer: Self-pay | Admitting: Urology

## 2022-05-23 DIAGNOSIS — M5116 Intervertebral disc disorders with radiculopathy, lumbar region: Secondary | ICD-10-CM | POA: Diagnosis not present

## 2022-05-23 DIAGNOSIS — M5416 Radiculopathy, lumbar region: Secondary | ICD-10-CM | POA: Diagnosis not present

## 2022-05-23 DIAGNOSIS — M48062 Spinal stenosis, lumbar region with neurogenic claudication: Secondary | ICD-10-CM | POA: Diagnosis not present

## 2022-05-23 DIAGNOSIS — Z6839 Body mass index (BMI) 39.0-39.9, adult: Secondary | ICD-10-CM | POA: Diagnosis not present

## 2022-05-30 ENCOUNTER — Other Ambulatory Visit: Payer: Self-pay | Admitting: Neurosurgery

## 2022-06-06 ENCOUNTER — Other Ambulatory Visit: Payer: Self-pay | Admitting: Neurosurgery

## 2022-06-07 NOTE — Pre-Procedure Instructions (Signed)
Surgical Instructions    Your procedure is scheduled on June 17, 2022.  Report to [facility] Main Entrance "A" at 7:45 A.M., then check in with the Admitting office.  Call this number if you have problems the morning of surgery:  [phone]   If you have any questions prior to your surgery date call [phone]: Open Monday-Friday 8am-4pm    Remember:  Do not eat or drink after midnight the night before your surgery      Take these medicines the morning of surgery with A SIP OF WATER :  acetaminophen (TYLENOL)   budesonide-formoterol (SYMBICORT)  DULoxetine (CYMBALTA)  esomeprazole (NEXIUM)   fluticasone (FLONASE)   gabapentin (NEURONTIN)  methimazole (TAPAZOLE)  propranolol (INDERAL)  sucralfate (CARAFATE)  RESTASIS   Take these medicines the morning of surgery with a sip of water AS NEEDED:  albuterol (VENTOLIN HFA) inhaler  fexofenadine (ALLEGRA)  hydrALAZINE (APRESOLINE)   methocarbamol (ROBAXIN)   guaiFENesin (ROBITUSSIN)  traMADol (ULTRAM)    As of today, STOP taking any Aspirin (unless otherwise instructed by your surgeon) Aleve, Naproxen, Ibuprofen, Motrin, Advil, Goody's, BC's, all herbal medications, fish oil, and all vitamins. This includes your medication: celecoxib (CELEBREX)                      Do NOT Smoke (Tobacco/Vaping) for 24 hours prior to your procedure.  If you use a CPAP at night, you may bring your mask/headgear for your overnight stay.   Contacts, glasses, piercing's, hearing aid's, dentures or partials may not be worn into surgery, please bring cases for these belongings.    For patients admitted to the hospital, discharge time will be determined by your treatment team.   Patients discharged the day of surgery will not be allowed to drive home, and someone needs to stay with them for 24 hours.  SURGICAL WAITING ROOM VISITATION Patients having surgery or a procedure may have no more than 2 support people in the waiting area - these  visitors may rotate.   Children under the age of 16 must have an adult with them who is not the patient. If the patient needs to stay at the hospital during part of their recovery, the visitor guidelines for inpatient rooms apply. Pre-op nurse will coordinate an appropriate time for 1 support person to accompany patient in pre-op.  This support person may not rotate.   Please refer to the [facility] website for the visitor guidelines for Inpatients (after your surgery is over and you are in a regular room).    Special instructions:   Christine Higgins- Preparing For Surgery  Before surgery, you can play an important role. Because skin is not sterile, your skin needs to be as free of germs as possible. You can reduce the number of germs on your skin by washing with CHG (chlorahexidine gluconate) Soap before surgery.  CHG is an antiseptic cleaner which kills germs and bonds with the skin to continue killing germs even after washing.    Oral Hygiene is also important to reduce your risk of infection.  Remember - BRUSH YOUR TEETH THE MORNING OF SURGERY WITH YOUR REGULAR TOOTHPASTE  Please do not use if you have an allergy to CHG or antibacterial soaps. If your skin becomes reddened/irritated stop using the CHG.  Do not shave (including legs and underarms) for at least 48 hours prior to first CHG shower. It is OK to shave your face.  Please follow these instructions carefully.   Shower the  NIGHT BEFORE SURGERY and the MORNING OF SURGERY  If you chose to wash your hair, wash your hair first as usual with your normal shampoo.  After you shampoo, rinse your hair and body thoroughly to remove the shampoo.  Use CHG Soap as you would any other liquid soap. You can apply CHG directly to the skin and wash gently with a scrungie or a clean washcloth.   Apply the CHG Soap to your body ONLY FROM THE NECK DOWN.  Do not use on open wounds or open sores. Avoid contact with your eyes, ears, mouth and genitals  (private parts). Wash Face and genitals (private parts)  with your normal soap.   Wash thoroughly, paying special attention to the area where your surgery will be performed.  Thoroughly rinse your body with warm water from the neck down.  DO NOT shower/wash with your normal soap after using and rinsing off the CHG Soap.  Pat yourself dry with a CLEAN TOWEL.  Wear CLEAN PAJAMAS to bed the night before surgery  Place CLEAN SHEETS on your bed the night before your surgery  DO NOT SLEEP WITH PETS.   Day of Surgery: Take a shower with CHG soap. Do not wear jewelry or makeup Do not wear lotions, powders, perfumes/colognes, or deodorant. Do not shave 48 hours prior to surgery.  Men may shave face and neck. Do not bring valuables to the hospital.  [facility] is not responsible for any belongings or valuables. Do not wear nail polish, gel polish, artificial nails, or any other type of covering on natural nails (fingers and toes) If you have artificial nails or gel coating that need to be removed by a nail salon, please have this removed prior to surgery. Artificial nails or gel coating may interfere with anesthesia's ability to adequately monitor your vital signs.  Wear Clean/Comfortable clothing the morning of surgery Remember to brush your teeth WITH YOUR REGULAR TOOTHPASTE.   Please read over the following fact sheets that you were given.    If you received a COVID test during your pre-op visit  it is requested that you wear a mask when out in public, stay away from anyone that may not be feeling well and notify your surgeon if you develop symptoms. If you have been in contact with anyone that has tested positive in the last 10 days please notify you surgeon.

## 2022-06-10 ENCOUNTER — Encounter (HOSPITAL_COMMUNITY)
Admission: RE | Admit: 2022-06-10 | Discharge: 2022-06-10 | Disposition: A | Discharge status: Home / Self Care | Payer: Medicare HMO | Source: Ambulatory Visit | Attending: Neurosurgery | Admitting: Neurosurgery

## 2022-06-10 ENCOUNTER — Other Ambulatory Visit (INDEPENDENT_AMBULATORY_CARE_PROVIDER_SITE_OTHER): Payer: Self-pay | Admitting: Nurse Practitioner

## 2022-06-10 ENCOUNTER — Encounter (HOSPITAL_COMMUNITY): Payer: Self-pay

## 2022-06-10 ENCOUNTER — Other Ambulatory Visit: Payer: Self-pay

## 2022-06-10 VITALS — BP 176/89 | HR 82 | Temp 98.0°F | Resp 17 | Ht 71.0 in | Wt 270.0 lb

## 2022-06-10 DIAGNOSIS — M7989 Other specified soft tissue disorders: Secondary | ICD-10-CM | POA: Diagnosis not present

## 2022-06-10 DIAGNOSIS — I2510 Atherosclerotic heart disease of native coronary artery without angina pectoris: Secondary | ICD-10-CM | POA: Insufficient documentation

## 2022-06-10 DIAGNOSIS — L4050 Arthropathic psoriasis, unspecified: Secondary | ICD-10-CM | POA: Diagnosis not present

## 2022-06-10 DIAGNOSIS — M11231 Other chondrocalcinosis, right wrist: Secondary | ICD-10-CM | POA: Diagnosis not present

## 2022-06-10 DIAGNOSIS — R9431 Abnormal electrocardiogram [ECG] [EKG]: Secondary | ICD-10-CM | POA: Diagnosis not present

## 2022-06-10 DIAGNOSIS — M79641 Pain in right hand: Secondary | ICD-10-CM | POA: Diagnosis not present

## 2022-06-10 DIAGNOSIS — Z7960 Long term (current) use of unspecified immunomodulators and immunosuppressants: Secondary | ICD-10-CM | POA: Diagnosis not present

## 2022-06-10 DIAGNOSIS — I10 Essential (primary) hypertension: Secondary | ICD-10-CM | POA: Diagnosis not present

## 2022-06-10 DIAGNOSIS — Z01818 Encounter for other preprocedural examination: Secondary | ICD-10-CM | POA: Diagnosis not present

## 2022-06-10 DIAGNOSIS — L409 Psoriasis, unspecified: Secondary | ICD-10-CM | POA: Diagnosis not present

## 2022-06-10 DIAGNOSIS — M79604 Pain in right leg: Secondary | ICD-10-CM

## 2022-06-10 DIAGNOSIS — M797 Fibromyalgia: Secondary | ICD-10-CM | POA: Diagnosis not present

## 2022-06-10 DIAGNOSIS — M159 Polyosteoarthritis, unspecified: Secondary | ICD-10-CM | POA: Diagnosis not present

## 2022-06-10 HISTORY — DX: Personal history of other diseases of the digestive system: Z87.19

## 2022-06-10 LAB — CBC
HCT: 37.7 % (ref 36.0–46.0)
Hemoglobin: 12.3 g/dL (ref 12.0–15.0)
MCH: 27.9 pg (ref 26.0–34.0)
MCHC: 32.6 g/dL (ref 30.0–36.0)
MCV: 85.5 fL (ref 80.0–100.0)
Platelets: 184 10*3/uL (ref 150–400)
RBC: 4.41 MIL/uL (ref 3.87–5.11)
RDW: 13.9 % (ref 11.5–15.5)
WBC: 2.6 10*3/uL — ABNORMAL LOW (ref 4.0–10.5)
nRBC: 0 % (ref 0.0–0.2)

## 2022-06-10 LAB — TYPE AND SCREEN
ABO/RH(D): B POS
Antibody Screen: NEGATIVE

## 2022-06-10 LAB — BASIC METABOLIC PANEL
Anion gap: 8 (ref 5–15)
BUN: 7 mg/dL — ABNORMAL LOW (ref 8–23)
CO2: 28 mmol/L (ref 22–32)
Calcium: 9.3 mg/dL (ref 8.9–10.3)
Chloride: 103 mmol/L (ref 98–111)
Creatinine, Ser: 0.68 mg/dL (ref 0.44–1.00)
GFR, Estimated: >60 mL/min (ref >60)
Glucose, Bld: 110 mg/dL — ABNORMAL HIGH (ref 70–99)
Potassium: 4 mmol/L (ref 3.5–5.1)
Sodium: 139 mmol/L (ref 135–145)

## 2022-06-10 LAB — SURGICAL PCR SCREEN
MRSA, PCR: NEGATIVE
Staphylococcus aureus: NEGATIVE

## 2022-06-10 NOTE — Progress Notes (Signed)
PCP - Dr. Frazier Richards Cardiologist - Per pt, Dr. Josefa Half was the cardiologist that did a cardiac catheterization in 2006  PPM/ICD - Denies Device Orders - n/a Rep Notified - n/a  Chest x-ray - 02/14/2022 EKG - 06/10/2022 Stress Test - 03/18/2022 ECHO - 01/31/2022 Cardiac Cath - Per pt, had one around 2006 that resulted normal.  Sleep Study - Denies CPAP - n/a  No DM  Last dose of GLP1 agonist- n/a GLP1 instructions: n/a  Blood Thinner Instructions: n/a Aspirin Instructions: n/a  NPO after midnight  COVID TEST- n/a   Anesthesia review: Yes. Abnormal EKG.   Patient denies shortness of breath, fever, cough and chest pain at PAT appointment   All instructions explained to the patient, with a verbal understanding of the material. Patient agrees to go over the instructions while at home for a better understanding. Patient also instructed to self quarantine after being tested for COVID-19. The opportunity to ask questions was provided.

## 2022-06-11 ENCOUNTER — Ambulatory Visit: Payer: Medicare HMO | Admitting: Physician Assistant

## 2022-06-11 NOTE — Anesthesia Preprocedure Evaluation (Addendum)
Anesthesia Evaluation  Patient identified by MRN, date of birth, ID band Patient awake    Reviewed: Allergy & Precautions, H&P , NPO status , Patient's Chart, lab work & pertinent test results  Airway Mallampati: III  TM Distance: >3 FB Neck ROM: Full    Dental no notable dental hx. (+) Edentulous Upper, Edentulous Lower, Dental Advisory Given   Pulmonary asthma , COPD,  COPD inhaler, former smoker   Pulmonary exam normal breath sounds clear to auscultation       Cardiovascular Exercise Tolerance: Good hypertension, Pt. on medications and Pt. on home beta blockers  Rhythm:Regular Rate:Normal     Neuro/Psych  Headaches  Anxiety Depression       GI/Hepatic Neg liver ROS, hiatal hernia,GERD  Medicated,,  Endo/Other  diabetes  Morbid obesity  Renal/GU negative Renal ROS  negative genitourinary   Musculoskeletal  (+) Arthritis ,  Fibromyalgia -  Abdominal   Peds  Hematology negative hematology ROS (+)   Anesthesia Other Findings   Reproductive/Obstetrics negative OB ROS                             Anesthesia Physical Anesthesia Plan  ASA: 3  Anesthesia Plan: General   Post-op Pain Management: Tylenol PO (pre-op)*   Induction: Intravenous  PONV Risk Score and Plan: 4 or greater and Ondansetron, Dexamethasone and Midazolam  Airway Management Planned: Oral ETT and Video Laryngoscope Planned  Additional Equipment:   Intra-op Plan:   Post-operative Plan: Extubation in OR  Informed Consent: I have reviewed the patients History and Physical, chart, labs and discussed the procedure including the risks, benefits and alternatives for the proposed anesthesia with the patient or authorized representative who has indicated his/her understanding and acceptance.     Dental advisory given  Plan Discussed with: CRNA  Anesthesia Plan Comments: (PAT note by Karoline Caldwell, PA-C: Patient recently  had echo and stress test ordered by her PCP at [facility] clinic for evaluation of dyspnea.  Stress test on 03/18/2022 showed normal LV function, normal wall motion, no evidence of scar or ischemia.  Echo 01/31/2022 showed normal biventricular function, mild tricuspid regurgitation.  Follows with pulmonology at [facility] clinic for history of mild COPD.  Former smoker, 22-year history, quit approximately 29 years ago.  Last seen 05/22/2022, noted to be doing well on Symbicort and as needed albuterol.  PFTs 10/18/2021 showed vc 2.38 liters ( 75 %), fev1 1.85 liters ( 74 %), fef 73 %, exp looks normal vs early restriction. Arlyce Harman is c/w early restriction.  Concern for OSA, patient was previously refused sleep study.  Follows with rheumatology at [facility] clinic for history of psoriatic arthritis maintained on [place].  Non-insulin-dependent DM2, last A1c 6.4 on 03/26/2022.  History of Graves' disease, maintained on methimazole.  History of prior spinal cord stimulator status post removal 04/09/2021.  History of C5-6 ACDF.  Review of previous anesthesia record shows that video laryngoscope has been used for recent intubations.  Preop labs reviewed, chronic leukopenia with WBC 2.6, otherwise unremarkable.  EKG 06/10/22: Normal sinus rhythm. Rate 73. Minimal voltage criteria for LVH, may be normal variant ( R in aVL ). Inferior infarct , age undetermined. Cannot rule out Anterior infarct , age undetermined  Nuclear stress 03/18/2022 (Care Everywhere): 1. Normal left ventricular function  2. Normal wall motion  3. No evidence for scar or ischemia  TTE 01/31/2022 (Care Everywhere): INTERPRETATION  NORMAL LEFT VENTRICULAR SYSTOLIC FUNCTION  NORMAL  RIGHT VENTRICULAR SYSTOLIC FUNCTION  MILD VALVULAR REGURGITATION (See above)  NO VALVULAR STENOSIS   )        Anesthesia Quick Evaluation

## 2022-06-11 NOTE — Progress Notes (Signed)
Anesthesia Chart Review:  Patient recently had echo and stress test ordered by her PCP at [facility] clinic for evaluation of dyspnea.  Stress test on 03/18/2022 showed normal LV function, normal wall motion, no evidence of scar or ischemia.  Echo 01/31/2022 showed normal biventricular function, mild tricuspid regurgitation.  Follows with pulmonology at [facility] clinic for history of mild COPD.  Former smoker, 22-year history, quit approximately 29 years ago.  Last seen 05/22/2022, noted to be doing well on Symbicort and as needed albuterol.  PFTs 10/18/2021 showed vc 2.38 liters ( 75 %), fev1 1.85 liters ( 74 %), fef 73 %, exp looks normal vs early restriction. Arlyce Harman is c/w early restriction.  Concern for OSA, patient was previously refused sleep study.  Follows with rheumatology at [facility] clinic for history of psoriatic arthritis maintained on [place].  Non-insulin-dependent DM2, last A1c 6.4 on 03/26/2022.  History of Graves' disease, maintained on methimazole.  History of prior spinal cord stimulator status post removal 04/09/2021.  History of C5-6 ACDF.  Review of previous anesthesia record shows that video laryngoscope has been used for recent intubations.  Preop labs reviewed, chronic leukopenia with WBC 2.6, otherwise unremarkable.  EKG 06/10/22: Normal sinus rhythm. Rate 73. Minimal voltage criteria for LVH, may be normal variant ( R in aVL ). Inferior infarct , age undetermined. Cannot rule out Anterior infarct , age undetermined  Nuclear stress 03/18/2022 (Care Everywhere): 1.  Normal left ventricular function  2.  Normal wall motion  3.  No evidence for scar or ischemia  TTE 01/31/2022 (Care Everywhere): INTERPRETATION  NORMAL LEFT VENTRICULAR SYSTOLIC FUNCTION  NORMAL RIGHT VENTRICULAR SYSTOLIC FUNCTION  MILD VALVULAR REGURGITATION (See above)  NO VALVULAR STENOSIS    Wynonia Musty [facility] Short Stay Center/Anesthesiology Phone [phone] 06/11/2022 12:37 PM

## 2022-06-12 ENCOUNTER — Encounter (INDEPENDENT_AMBULATORY_CARE_PROVIDER_SITE_OTHER): Payer: Medicare HMO

## 2022-06-17 ENCOUNTER — Other Ambulatory Visit: Payer: Self-pay

## 2022-06-17 ENCOUNTER — Ambulatory Visit (HOSPITAL_COMMUNITY): Payer: Medicare HMO

## 2022-06-17 ENCOUNTER — Ambulatory Visit (HOSPITAL_COMMUNITY)
Admission: RE | Admit: 2022-06-17 | Discharge: 2022-06-18 | Disposition: A | Discharge status: Home / Self Care | Payer: Medicare HMO | Source: Home / Self Care | Attending: Neurosurgery | Admitting: Neurosurgery

## 2022-06-17 ENCOUNTER — Encounter (HOSPITAL_COMMUNITY): Payer: Self-pay | Admitting: Neurosurgery

## 2022-06-17 ENCOUNTER — Ambulatory Visit (HOSPITAL_BASED_OUTPATIENT_CLINIC_OR_DEPARTMENT_OTHER): Payer: Medicare HMO | Admitting: Physician Assistant

## 2022-06-17 ENCOUNTER — Ambulatory Visit (HOSPITAL_COMMUNITY): Payer: Medicare HMO | Admitting: Physician Assistant

## 2022-06-17 ENCOUNTER — Inpatient Hospital Stay (HOSPITAL_COMMUNITY)
Admission: RE | Disposition: A | Discharge status: Home / Self Care | Payer: Self-pay | Source: Home / Self Care | Attending: Neurosurgery

## 2022-06-17 DIAGNOSIS — E119 Type 2 diabetes mellitus without complications: Secondary | ICD-10-CM | POA: Insufficient documentation

## 2022-06-17 DIAGNOSIS — Z0389 Encounter for observation for other suspected diseases and conditions ruled out: Secondary | ICD-10-CM | POA: Diagnosis not present

## 2022-06-17 DIAGNOSIS — Z87891 Personal history of nicotine dependence: Secondary | ICD-10-CM | POA: Diagnosis not present

## 2022-06-17 DIAGNOSIS — W19XXXA Unspecified fall, initial encounter: Secondary | ICD-10-CM | POA: Diagnosis not present

## 2022-06-17 DIAGNOSIS — M48062 Spinal stenosis, lumbar region with neurogenic claudication: Secondary | ICD-10-CM

## 2022-06-17 DIAGNOSIS — M4316 Spondylolisthesis, lumbar region: Secondary | ICD-10-CM | POA: Diagnosis present

## 2022-06-17 DIAGNOSIS — M797 Fibromyalgia: Secondary | ICD-10-CM | POA: Insufficient documentation

## 2022-06-17 DIAGNOSIS — G894 Chronic pain syndrome: Secondary | ICD-10-CM | POA: Diagnosis present

## 2022-06-17 DIAGNOSIS — M4326 Fusion of spine, lumbar region: Secondary | ICD-10-CM | POA: Diagnosis not present

## 2022-06-17 DIAGNOSIS — M4726 Other spondylosis with radiculopathy, lumbar region: Secondary | ICD-10-CM | POA: Diagnosis present

## 2022-06-17 DIAGNOSIS — G8918 Other acute postprocedural pain: Secondary | ICD-10-CM | POA: Diagnosis present

## 2022-06-17 DIAGNOSIS — D649 Anemia, unspecified: Secondary | ICD-10-CM | POA: Diagnosis present

## 2022-06-17 DIAGNOSIS — E871 Hypo-osmolality and hyponatremia: Secondary | ICD-10-CM | POA: Diagnosis present

## 2022-06-17 DIAGNOSIS — F32A Depression, unspecified: Secondary | ICD-10-CM | POA: Diagnosis present

## 2022-06-17 DIAGNOSIS — R32 Unspecified urinary incontinence: Secondary | ICD-10-CM | POA: Diagnosis not present

## 2022-06-17 DIAGNOSIS — Y838 Other surgical procedures as the cause of abnormal reaction of the patient, or of later complication, without mention of misadventure at the time of the procedure: Secondary | ICD-10-CM | POA: Diagnosis present

## 2022-06-17 DIAGNOSIS — I10 Essential (primary) hypertension: Secondary | ICD-10-CM | POA: Insufficient documentation

## 2022-06-17 DIAGNOSIS — M96840 Postprocedural hematoma of a musculoskeletal structure following a musculoskeletal system procedure: Secondary | ICD-10-CM | POA: Diagnosis not present

## 2022-06-17 DIAGNOSIS — K219 Gastro-esophageal reflux disease without esophagitis: Secondary | ICD-10-CM | POA: Diagnosis present

## 2022-06-17 DIAGNOSIS — M5116 Intervertebral disc disorders with radiculopathy, lumbar region: Secondary | ICD-10-CM

## 2022-06-17 DIAGNOSIS — M7981 Nontraumatic hematoma of soft tissue: Secondary | ICD-10-CM | POA: Diagnosis not present

## 2022-06-17 DIAGNOSIS — Z7401 Bed confinement status: Secondary | ICD-10-CM | POA: Diagnosis not present

## 2022-06-17 DIAGNOSIS — S300XXA Contusion of lower back and pelvis, initial encounter: Secondary | ICD-10-CM | POA: Diagnosis not present

## 2022-06-17 DIAGNOSIS — J449 Chronic obstructive pulmonary disease, unspecified: Secondary | ICD-10-CM | POA: Insufficient documentation

## 2022-06-17 DIAGNOSIS — J45909 Unspecified asthma, uncomplicated: Secondary | ICD-10-CM | POA: Diagnosis not present

## 2022-06-17 DIAGNOSIS — L4050 Arthropathic psoriasis, unspecified: Secondary | ICD-10-CM | POA: Diagnosis present

## 2022-06-17 DIAGNOSIS — R262 Difficulty in walking, not elsewhere classified: Secondary | ICD-10-CM | POA: Diagnosis present

## 2022-06-17 DIAGNOSIS — K449 Diaphragmatic hernia without obstruction or gangrene: Secondary | ICD-10-CM | POA: Insufficient documentation

## 2022-06-17 DIAGNOSIS — Z96641 Presence of right artificial hip joint: Secondary | ICD-10-CM | POA: Diagnosis present

## 2022-06-17 DIAGNOSIS — Z96612 Presence of left artificial shoulder joint: Secondary | ICD-10-CM | POA: Diagnosis present

## 2022-06-17 DIAGNOSIS — E785 Hyperlipidemia, unspecified: Secondary | ICD-10-CM | POA: Diagnosis present

## 2022-06-17 DIAGNOSIS — S32030D Wedge compression fracture of third lumbar vertebra, subsequent encounter for fracture with routine healing: Secondary | ICD-10-CM | POA: Diagnosis not present

## 2022-06-17 DIAGNOSIS — M549 Dorsalgia, unspecified: Secondary | ICD-10-CM | POA: Diagnosis not present

## 2022-06-17 DIAGNOSIS — E876 Hypokalemia: Secondary | ICD-10-CM | POA: Diagnosis present

## 2022-06-17 DIAGNOSIS — H409 Unspecified glaucoma: Secondary | ICD-10-CM | POA: Diagnosis present

## 2022-06-17 DIAGNOSIS — M79604 Pain in right leg: Secondary | ICD-10-CM | POA: Diagnosis not present

## 2022-06-17 DIAGNOSIS — G9752 Postprocedural hemorrhage and hematoma of a nervous system organ or structure following other procedure: Secondary | ICD-10-CM | POA: Diagnosis present

## 2022-06-17 DIAGNOSIS — J4489 Other specified chronic obstructive pulmonary disease: Secondary | ICD-10-CM | POA: Diagnosis present

## 2022-06-17 DIAGNOSIS — Z6841 Body Mass Index (BMI) 40.0 and over, adult: Secondary | ICD-10-CM | POA: Diagnosis not present

## 2022-06-17 DIAGNOSIS — M545 Low back pain, unspecified: Secondary | ICD-10-CM | POA: Diagnosis present

## 2022-06-17 DIAGNOSIS — F418 Other specified anxiety disorders: Secondary | ICD-10-CM | POA: Insufficient documentation

## 2022-06-17 DIAGNOSIS — R531 Weakness: Secondary | ICD-10-CM | POA: Diagnosis not present

## 2022-06-17 DIAGNOSIS — M79605 Pain in left leg: Secondary | ICD-10-CM | POA: Diagnosis not present

## 2022-06-17 DIAGNOSIS — Z96653 Presence of artificial knee joint, bilateral: Secondary | ICD-10-CM | POA: Diagnosis present

## 2022-06-17 DIAGNOSIS — R339 Retention of urine, unspecified: Secondary | ICD-10-CM | POA: Diagnosis present

## 2022-06-17 HISTORY — DX: Type 2 diabetes mellitus without complications: E11.9

## 2022-06-17 LAB — GLUCOSE, CAPILLARY
Glucose-Capillary: 115 mg/dL — ABNORMAL HIGH (ref 70–99)
Glucose-Capillary: 173 mg/dL — ABNORMAL HIGH (ref 70–99)
Glucose-Capillary: 136 mg/dL — ABNORMAL HIGH (ref 70–99)
Glucose-Capillary: 125 mg/dL — ABNORMAL HIGH (ref 70–99)

## 2022-06-17 SURGERY — POSTERIOR LUMBAR FUSION 1 LEVEL
Anesthesia: General

## 2022-06-17 MED ORDER — PHENYLEPHRINE HCL-NACL 20-0.9 MG/250ML-% IV SOLN: INTRAVENOUS | Status: DC | PRN

## 2022-06-17 MED ORDER — CHLORHEXIDINE GLUCONATE 0.12 % MT SOLN
15.0000 mL | Freq: Once | OROMUCOSAL | Status: AC
  Administered 2022-06-17: 15 mL via OROMUCOSAL
  Filled 2022-06-17: qty 15

## 2022-06-17 MED ORDER — ONDANSETRON HCL 4 MG/2ML IJ SOLN
INTRAMUSCULAR | Status: AC
  Filled 2022-06-17: qty 2

## 2022-06-17 MED ORDER — ACETAMINOPHEN 500 MG PO TABS
1000.0000 mg | ORAL_TABLET | Freq: Four times a day (QID) | ORAL | Status: DC
  Administered 2022-06-17 – 2022-06-18 (×3): 1000 mg via ORAL
  Filled 2022-06-17 (×3): qty 2

## 2022-06-17 MED ORDER — CLONAZEPAM 0.5 MG PO TABS
0.5000 mg | ORAL_TABLET | Freq: Two times a day (BID) | ORAL | Status: DC | PRN
  Administered 2022-06-17: 0.5 mg via ORAL
  Filled 2022-06-17: qty 1

## 2022-06-17 MED ORDER — VANCOMYCIN HCL IN DEXTROSE 1-5 GM/200ML-% IV SOLN
1000.0000 mg | INTRAVENOUS | Status: AC
  Administered 2022-06-17: 1000 mg via INTRAVENOUS
  Filled 2022-06-17: qty 200

## 2022-06-17 MED ORDER — MENTHOL 3 MG MT LOZG: 1.0000 | LOZENGE | OROMUCOSAL | Status: DC | PRN

## 2022-06-17 MED ORDER — DULOXETINE HCL 30 MG PO CPEP
60.0000 mg | ORAL_CAPSULE | Freq: Every day | ORAL | Status: DC
  Administered 2022-06-18: 60 mg via ORAL
  Filled 2022-06-17: qty 2

## 2022-06-17 MED ORDER — DOCUSATE SODIUM 100 MG PO CAPS
100.0000 mg | ORAL_CAPSULE | Freq: Two times a day (BID) | ORAL | Status: DC
  Administered 2022-06-17 – 2022-06-18 (×2): 100 mg via ORAL
  Filled 2022-06-17 (×2): qty 1

## 2022-06-17 MED ORDER — PANTOPRAZOLE SODIUM 40 MG PO TBEC
80.0000 mg | DELAYED_RELEASE_TABLET | Freq: Every day | ORAL | Status: DC
  Administered 2022-06-18: 80 mg via ORAL
  Filled 2022-06-17: qty 2

## 2022-06-17 MED ORDER — BISACODYL 10 MG RE SUPP: 10.0000 mg | Freq: Every day | RECTAL | Status: DC | PRN

## 2022-06-17 MED ORDER — OXYCODONE HCL 5 MG PO TABS: 5.0000 mg | ORAL_TABLET | ORAL | Status: DC | PRN

## 2022-06-17 MED ORDER — PHENOL 1.4 % MT LIQD: 1.0000 | OROMUCOSAL | Status: DC | PRN

## 2022-06-17 MED ORDER — CHLORHEXIDINE GLUCONATE CLOTH 2 % EX PADS: 6.0000 | MEDICATED_PAD | Freq: Once | CUTANEOUS | Status: DC

## 2022-06-17 MED ORDER — PROPOFOL 10 MG/ML IV BOLUS
INTRAVENOUS | Status: AC
  Filled 2022-06-17: qty 20

## 2022-06-17 MED ORDER — ONDANSETRON HCL 4 MG PO TABS: 4.0000 mg | ORAL_TABLET | Freq: Four times a day (QID) | ORAL | Status: DC | PRN

## 2022-06-17 MED ORDER — POTASSIUM CHLORIDE ER 10 MEQ PO TBCR
10.0000 meq | EXTENDED_RELEASE_TABLET | Freq: Every day | ORAL | Status: DC
  Administered 2022-06-18: 10 meq via ORAL
  Filled 2022-06-17 (×2): qty 1

## 2022-06-17 MED ORDER — BUPIVACAINE LIPOSOME 1.3 % IJ SUSP
INTRAMUSCULAR | Status: AC
  Filled 2022-06-17: qty 20

## 2022-06-17 MED ORDER — PROPRANOLOL HCL 40 MG PO TABS
40.0000 mg | ORAL_TABLET | Freq: Two times a day (BID) | ORAL | Status: DC
  Filled 2022-06-17: qty 1

## 2022-06-17 MED ORDER — SODIUM CHLORIDE 0.9% FLUSH
3.0000 mL | Freq: Two times a day (BID) | INTRAVENOUS | Status: DC
  Administered 2022-06-17: 3 mL via INTRAVENOUS

## 2022-06-17 MED ORDER — METHIMAZOLE 5 MG PO TABS
5.0000 mg | ORAL_TABLET | Freq: Every day | ORAL | Status: DC
  Administered 2022-06-18: 5 mg via ORAL
  Filled 2022-06-17: qty 1

## 2022-06-17 MED ORDER — EPHEDRINE 5 MG/ML INJ
INTRAVENOUS | Status: AC
  Filled 2022-06-17: qty 10

## 2022-06-17 MED ORDER — VANCOMYCIN HCL 1500 MG/300ML IV SOLN
1500.0000 mg | Freq: Once | INTRAVENOUS | Status: AC
  Administered 2022-06-17: 1500 mg via INTRAVENOUS
  Filled 2022-06-17: qty 300

## 2022-06-17 MED ORDER — DULOXETINE HCL 30 MG PO CPEP
30.0000 mg | ORAL_CAPSULE | Freq: Every day | ORAL | Status: DC
  Administered 2022-06-17: 30 mg via ORAL
  Filled 2022-06-17: qty 1

## 2022-06-17 MED ORDER — OXYCODONE HCL 5 MG PO TABS
10.0000 mg | ORAL_TABLET | ORAL | Status: DC | PRN
  Administered 2022-06-17 – 2022-06-18 (×4): 10 mg via ORAL
  Filled 2022-06-17 (×4): qty 2

## 2022-06-17 MED ORDER — KETAMINE HCL 50 MG/5ML IJ SOSY
PREFILLED_SYRINGE | INTRAMUSCULAR | Status: AC
  Filled 2022-06-17: qty 5

## 2022-06-17 MED ORDER — 0.9 % SODIUM CHLORIDE (POUR BTL) OPTIME
TOPICAL | Status: DC | PRN
  Administered 2022-06-17: 1000 mL

## 2022-06-17 MED ORDER — ONDANSETRON HCL 4 MG/2ML IJ SOLN
INTRAMUSCULAR | Status: DC | PRN
  Administered 2022-06-17: 4 mg via INTRAVENOUS

## 2022-06-17 MED ORDER — BACITRACIN ZINC 500 UNIT/GM EX OINT
TOPICAL_OINTMENT | CUTANEOUS | Status: DC | PRN
  Administered 2022-06-17: 1 via TOPICAL

## 2022-06-17 MED ORDER — SUGAMMADEX SODIUM 500 MG/5ML IV SOLN
INTRAVENOUS | Status: AC
  Filled 2022-06-17: qty 5

## 2022-06-17 MED ORDER — TRAMADOL HCL 50 MG PO TABS: 50.0000 mg | ORAL_TABLET | Freq: Four times a day (QID) | ORAL | Status: DC | PRN

## 2022-06-17 MED ORDER — FENTANYL CITRATE (PF) 250 MCG/5ML IJ SOLN
INTRAMUSCULAR | Status: DC | PRN
  Administered 2022-06-17 (×3): 50 ug via INTRAVENOUS
  Administered 2022-06-17: 100 ug via INTRAVENOUS

## 2022-06-17 MED ORDER — ORAL CARE MOUTH RINSE: 15.0000 mL | Freq: Once | OROMUCOSAL | Status: AC

## 2022-06-17 MED ORDER — HYDROMORPHONE HCL 1 MG/ML IJ SOLN
0.2500 mg | INTRAMUSCULAR | Status: DC | PRN
  Administered 2022-06-17 (×3): 0.5 mg via INTRAVENOUS

## 2022-06-17 MED ORDER — LINACLOTIDE 145 MCG PO CAPS
290.0000 ug | ORAL_CAPSULE | Freq: Every day | ORAL | Status: DC
  Administered 2022-06-18: 290 ug via ORAL
  Filled 2022-06-17: qty 2

## 2022-06-17 MED ORDER — ACETAMINOPHEN 325 MG PO TABS: 650.0000 mg | ORAL_TABLET | ORAL | Status: DC | PRN

## 2022-06-17 MED ORDER — FENTANYL CITRATE (PF) 250 MCG/5ML IJ SOLN
INTRAMUSCULAR | Status: AC
  Filled 2022-06-17: qty 5

## 2022-06-17 MED ORDER — HYDROMORPHONE HCL 1 MG/ML IJ SOLN
INTRAMUSCULAR | Status: AC
  Filled 2022-06-17: qty 1

## 2022-06-17 MED ORDER — BACITRACIN ZINC 500 UNIT/GM EX OINT
TOPICAL_OINTMENT | CUTANEOUS | Status: AC
  Filled 2022-06-17: qty 28.35

## 2022-06-17 MED ORDER — ZOLPIDEM TARTRATE 5 MG PO TABS: 5.0000 mg | ORAL_TABLET | Freq: Every evening | ORAL | Status: DC | PRN

## 2022-06-17 MED ORDER — ALBUTEROL SULFATE (2.5 MG/3ML) 0.083% IN NEBU: 2.5000 mg | INHALATION_SOLUTION | RESPIRATORY_TRACT | Status: DC | PRN

## 2022-06-17 MED ORDER — LACTATED RINGERS IV SOLN: INTRAVENOUS | Status: DC

## 2022-06-17 MED ORDER — PHENYLEPHRINE 80 MCG/ML (10ML) SYRINGE FOR IV PUSH (FOR BLOOD PRESSURE SUPPORT)
PREFILLED_SYRINGE | INTRAVENOUS | Status: DC | PRN
  Administered 2022-06-17: 80 ug via INTRAVENOUS
  Administered 2022-06-17: 160 ug via INTRAVENOUS

## 2022-06-17 MED ORDER — SUCRALFATE 1 G PO TABS
1.0000 g | ORAL_TABLET | Freq: Three times a day (TID) | ORAL | Status: DC
  Administered 2022-06-17 – 2022-06-18 (×2): 1 g via ORAL
  Filled 2022-06-17 (×2): qty 1

## 2022-06-17 MED ORDER — ATORVASTATIN CALCIUM 10 MG PO TABS
10.0000 mg | ORAL_TABLET | Freq: Every day | ORAL | Status: DC
  Administered 2022-06-17: 10 mg via ORAL
  Filled 2022-06-17: qty 1

## 2022-06-17 MED ORDER — THROMBIN 5000 UNITS EX SOLR
OROMUCOSAL | Status: DC | PRN
  Administered 2022-06-17: 5 mL via TOPICAL

## 2022-06-17 MED ORDER — KETAMINE HCL 10 MG/ML IJ SOLN
INTRAMUSCULAR | Status: DC | PRN
  Administered 2022-06-17: 20 mg via INTRAVENOUS
  Administered 2022-06-17 (×3): 10 mg via INTRAVENOUS

## 2022-06-17 MED ORDER — PHENYLEPHRINE 80 MCG/ML (10ML) SYRINGE FOR IV PUSH (FOR BLOOD PRESSURE SUPPORT)
PREFILLED_SYRINGE | INTRAVENOUS | Status: AC
  Filled 2022-06-17: qty 10

## 2022-06-17 MED ORDER — SODIUM CHLORIDE 0.9 % IV SOLN
250.0000 mL | INTRAVENOUS | Status: DC
  Administered 2022-06-17: 250 mL via INTRAVENOUS

## 2022-06-17 MED ORDER — DEXAMETHASONE SODIUM PHOSPHATE 10 MG/ML IJ SOLN
INTRAMUSCULAR | Status: AC
  Filled 2022-06-17: qty 1

## 2022-06-17 MED ORDER — PHENYLEPHRINE HCL-NACL 20-0.9 MG/250ML-% IV SOLN
INTRAVENOUS | Status: DC | PRN
  Administered 2022-06-17: 25 ug/min via INTRAVENOUS

## 2022-06-17 MED ORDER — PROPOFOL 10 MG/ML IV BOLUS
INTRAVENOUS | Status: DC | PRN
  Administered 2022-06-17: 150 mg via INTRAVENOUS

## 2022-06-17 MED ORDER — MIDAZOLAM HCL 2 MG/2ML IJ SOLN
INTRAMUSCULAR | Status: AC
  Filled 2022-06-17: qty 2

## 2022-06-17 MED ORDER — PROPRANOLOL HCL 80 MG PO TABS
80.0000 mg | ORAL_TABLET | Freq: Two times a day (BID) | ORAL | Status: DC
  Administered 2022-06-17 – 2022-06-18 (×2): 80 mg via ORAL
  Filled 2022-06-17 (×3): qty 1

## 2022-06-17 MED ORDER — THROMBIN 5000 UNITS EX SOLR
CUTANEOUS | Status: AC
  Filled 2022-06-17: qty 5000

## 2022-06-17 MED ORDER — ROCURONIUM BROMIDE 10 MG/ML (PF) SYRINGE
PREFILLED_SYRINGE | INTRAVENOUS | Status: AC
  Filled 2022-06-17: qty 10

## 2022-06-17 MED ORDER — ACETAMINOPHEN 500 MG PO TABS: 1000.0000 mg | ORAL_TABLET | Freq: Once | ORAL | Status: DC

## 2022-06-17 MED ORDER — ROCURONIUM BROMIDE 10 MG/ML (PF) SYRINGE
PREFILLED_SYRINGE | INTRAVENOUS | Status: DC | PRN
  Administered 2022-06-17: 10 mg via INTRAVENOUS
  Administered 2022-06-17: 30 mg via INTRAVENOUS
  Administered 2022-06-17: 70 mg via INTRAVENOUS
  Administered 2022-06-17: 20 mg via INTRAVENOUS

## 2022-06-17 MED ORDER — BUPIVACAINE-EPINEPHRINE (PF) 0.5% -1:200000 IJ SOLN
INTRAMUSCULAR | Status: AC
  Filled 2022-06-17: qty 30

## 2022-06-17 MED ORDER — MORPHINE SULFATE (PF) 4 MG/ML IV SOLN: 4.0000 mg | INTRAVENOUS | Status: DC | PRN

## 2022-06-17 MED ORDER — EPHEDRINE SULFATE-NACL 50-0.9 MG/10ML-% IV SOSY
PREFILLED_SYRINGE | INTRAVENOUS | Status: DC | PRN
  Administered 2022-06-17 (×4): 5 mg via INTRAVENOUS

## 2022-06-17 MED ORDER — METHOCARBAMOL 750 MG PO TABS
750.0000 mg | ORAL_TABLET | Freq: Three times a day (TID) | ORAL | Status: DC | PRN
  Administered 2022-06-17 – 2022-06-18 (×2): 750 mg via ORAL
  Filled 2022-06-17 (×3): qty 1

## 2022-06-17 MED ORDER — SODIUM CHLORIDE 0.9% FLUSH: 3.0000 mL | INTRAVENOUS | Status: DC | PRN

## 2022-06-17 MED ORDER — LIDOCAINE 2% (20 MG/ML) 5 ML SYRINGE
INTRAMUSCULAR | Status: AC
  Filled 2022-06-17: qty 5

## 2022-06-17 MED ORDER — MOMETASONE FURO-FORMOTEROL FUM 200-5 MCG/ACT IN AERO
2.0000 | INHALATION_SPRAY | Freq: Two times a day (BID) | RESPIRATORY_TRACT | Status: DC
  Administered 2022-06-18: 2 via RESPIRATORY_TRACT
  Filled 2022-06-17 (×2): qty 8.8

## 2022-06-17 MED ORDER — ONDANSETRON HCL 4 MG/2ML IJ SOLN: 4.0000 mg | Freq: Four times a day (QID) | INTRAMUSCULAR | Status: DC | PRN

## 2022-06-17 MED ORDER — LORATADINE 10 MG PO TABS
10.0000 mg | ORAL_TABLET | Freq: Every evening | ORAL | Status: DC
  Administered 2022-06-17: 10 mg via ORAL
  Filled 2022-06-17: qty 1

## 2022-06-17 MED ORDER — FLUTICASONE PROPIONATE 50 MCG/ACT NA SUSP
2.0000 | Freq: Every day | NASAL | Status: DC
  Filled 2022-06-17: qty 16

## 2022-06-17 MED ORDER — ROCURONIUM BROMIDE 10 MG/ML (PF) SYRINGE
PREFILLED_SYRINGE | INTRAVENOUS | Status: AC
  Filled 2022-06-17: qty 20

## 2022-06-17 MED ORDER — BUPIVACAINE-EPINEPHRINE (PF) 0.5% -1:200000 IJ SOLN
INTRAMUSCULAR | Status: DC | PRN
  Administered 2022-06-17: 10 mL via PERINEURAL

## 2022-06-17 MED ORDER — TORSEMIDE 20 MG PO TABS
10.0000 mg | ORAL_TABLET | Freq: Every day | ORAL | Status: DC
  Administered 2022-06-18: 10 mg via ORAL
  Filled 2022-06-17: qty 1

## 2022-06-17 MED ORDER — CYCLOSPORINE 0.05 % OP EMUL
1.0000 [drp] | Freq: Two times a day (BID) | OPHTHALMIC | Status: DC
  Administered 2022-06-17: 1 [drp] via OPHTHALMIC
  Filled 2022-06-17 (×3): qty 30

## 2022-06-17 MED ORDER — MIDAZOLAM HCL 2 MG/2ML IJ SOLN
INTRAMUSCULAR | Status: DC | PRN
  Administered 2022-06-17: 2 mg via INTRAVENOUS

## 2022-06-17 MED ORDER — SUGAMMADEX SODIUM 200 MG/2ML IV SOLN
INTRAVENOUS | Status: DC | PRN
  Administered 2022-06-17: 300 mg via INTRAVENOUS

## 2022-06-17 MED ORDER — BUPIVACAINE LIPOSOME 1.3 % IJ SUSP
INTRAMUSCULAR | Status: DC | PRN
  Administered 2022-06-17: 20 mL

## 2022-06-17 MED ORDER — LIDOCAINE 2% (20 MG/ML) 5 ML SYRINGE
INTRAMUSCULAR | Status: DC | PRN
  Administered 2022-06-17: 60 mg via INTRAVENOUS

## 2022-06-17 MED ORDER — ACETAMINOPHEN 650 MG RE SUPP: 650.0000 mg | RECTAL | Status: DC | PRN

## 2022-06-17 MED ORDER — LOSARTAN POTASSIUM 50 MG PO TABS
100.0000 mg | ORAL_TABLET | Freq: Every day | ORAL | Status: DC
  Administered 2022-06-18: 100 mg via ORAL
  Filled 2022-06-17: qty 2

## 2022-06-17 MED ORDER — GABAPENTIN 600 MG PO TABS
1200.0000 mg | ORAL_TABLET | Freq: Three times a day (TID) | ORAL | Status: DC
  Administered 2022-06-17 – 2022-06-18 (×2): 1200 mg via ORAL
  Filled 2022-06-17 (×2): qty 2

## 2022-06-17 MED ORDER — DEXAMETHASONE SODIUM PHOSPHATE 10 MG/ML IJ SOLN
INTRAMUSCULAR | Status: DC | PRN
  Administered 2022-06-17: 10 mg via INTRAVENOUS

## 2022-06-17 SURGICAL SUPPLY — 67 items
APL SKNCLS STERI-STRIP NONHPOA (GAUZE/BANDAGES/DRESSINGS) ×1
BAG COUNTER SPONGE SURGICOUNT (BAG) ×2 IMPLANT
BAG SPNG CNTER NS LX DISP (BAG) ×1
BASKET BONE COLLECTION (BASKET) ×2 IMPLANT
BENZOIN TINCTURE PRP APPL 2/3 (GAUZE/BANDAGES/DRESSINGS) ×2 IMPLANT
BLADE CLIPPER SURG (BLADE) IMPLANT
BUR MATCHSTICK NEURO 3.0 LAGG (BURR) ×2 IMPLANT
BUR PRECISION FLUTE 6.0 (BURR) ×2 IMPLANT
CAGE ALTERA 10X31X9-13 15D (Cage) IMPLANT
CANISTER SUCT 3000ML PPV (MISCELLANEOUS) ×2 IMPLANT
CAP LOCK DLX THRD (Cap) IMPLANT
CNTNR URN SCR LID CUP LEK RST (MISCELLANEOUS) ×2 IMPLANT
CONT SPEC 4OZ STRL OR WHT (MISCELLANEOUS) ×1
COVER BACK TABLE 60X90IN (DRAPES) ×2 IMPLANT
DRAPE C-ARM 42X72 X-RAY (DRAPES) ×4 IMPLANT
DRAPE HALF SHEET 40X57 (DRAPES) ×2 IMPLANT
DRAPE LAPAROTOMY 100X72X124 (DRAPES) ×2 IMPLANT
DRAPE SURG 17X23 STRL (DRAPES) ×8 IMPLANT
DRSG OPSITE POSTOP 4X6 (GAUZE/BANDAGES/DRESSINGS) ×2 IMPLANT
ELECT BLADE 4.0 EZ CLEAN MEGAD (MISCELLANEOUS) ×1
ELECT REM PT RETURN 9FT ADLT (ELECTROSURGICAL) ×1
ELECTRODE BLDE 4.0 EZ CLN MEGD (MISCELLANEOUS) ×2 IMPLANT
ELECTRODE REM PT RTRN 9FT ADLT (ELECTROSURGICAL) ×2 IMPLANT
EVACUATOR 1/8 PVC DRAIN (DRAIN) IMPLANT
GAUZE 4X4 16PLY ~~LOC~~+RFID DBL (SPONGE) ×2 IMPLANT
GLOVE BIO SURGEON STRL SZ 6 (GLOVE) ×2 IMPLANT
GLOVE BIO SURGEON STRL SZ8 (GLOVE) ×4 IMPLANT
GLOVE BIO SURGEON STRL SZ8.5 (GLOVE) ×4 IMPLANT
GLOVE BIOGEL PI IND STRL 6.5 (GLOVE) ×2 IMPLANT
GLOVE BIOGEL PI IND STRL 7.0 (GLOVE) IMPLANT
GLOVE ECLIPSE 6.5 STRL STRAW (GLOVE) IMPLANT
GLOVE EXAM NITRILE XL STR (GLOVE) IMPLANT
GLOVE SS BIOGEL STRL SZ 6.5 (GLOVE) IMPLANT
GOWN STRL REUS W/ TWL LRG LVL3 (GOWN DISPOSABLE) ×2 IMPLANT
GOWN STRL REUS W/ TWL XL LVL3 (GOWN DISPOSABLE) ×4 IMPLANT
GOWN STRL REUS W/TWL 2XL LVL3 (GOWN DISPOSABLE) IMPLANT
GOWN STRL REUS W/TWL LRG LVL3 (GOWN DISPOSABLE) ×4
GOWN STRL REUS W/TWL XL LVL3 (GOWN DISPOSABLE) ×2
HEMOSTAT POWDER KIT SURGIFOAM (HEMOSTASIS) ×2 IMPLANT
KIT BASIN OR (CUSTOM PROCEDURE TRAY) ×2 IMPLANT
KIT GRAFTMAG DEL NEURO DISP (NEUROSURGERY SUPPLIES) IMPLANT
KIT TURNOVER KIT B (KITS) ×2 IMPLANT
NDL HYPO 21X1.5 SAFETY (NEEDLE) IMPLANT
NEEDLE HYPO 21X1.5 SAFETY (NEEDLE) ×1 IMPLANT
NEEDLE HYPO 22GX1.5 SAFETY (NEEDLE) ×2 IMPLANT
NS IRRIG 1000ML POUR BTL (IV SOLUTION) ×2 IMPLANT
PACK LAMINECTOMY NEURO (CUSTOM PROCEDURE TRAY) ×2 IMPLANT
PAD ARMBOARD 7.5X6 YLW CONV (MISCELLANEOUS) ×6 IMPLANT
PATTIES SURGICAL .5 X.5 (GAUZE/BANDAGES/DRESSINGS) IMPLANT
PATTIES SURGICAL .5 X1 (DISPOSABLE) IMPLANT
PATTIES SURGICAL 1X1 (DISPOSABLE) IMPLANT
PUTTY DBM 10CC CALC GRAN (Putty) IMPLANT
ROD CURVED TI 6.35X50 (Rod) IMPLANT
SCREW PA DLX CREO 7.5X50 (Screw) IMPLANT
SPIKE FLUID TRANSFER (MISCELLANEOUS) ×2 IMPLANT
SPONGE NEURO XRAY DETECT 1X3 (DISPOSABLE) IMPLANT
SPONGE SURGIFOAM ABS GEL 100 (HEMOSTASIS) IMPLANT
SPONGE T-LAP 4X18 ~~LOC~~+RFID (SPONGE) IMPLANT
STRIP CLOSURE SKIN 1/2X4 (GAUZE/BANDAGES/DRESSINGS) ×2 IMPLANT
SUT VIC AB 1 CT1 18XBRD ANBCTR (SUTURE) ×4 IMPLANT
SUT VIC AB 1 CT1 8-18 (SUTURE) ×2
SUT VIC AB 2-0 CP2 18 (SUTURE) ×4 IMPLANT
SYR 20ML LL LF (SYRINGE) IMPLANT
TOWEL GREEN STERILE (TOWEL DISPOSABLE) ×2 IMPLANT
TOWEL GREEN STERILE FF (TOWEL DISPOSABLE) ×2 IMPLANT
TRAY FOLEY MTR SLVR 16FR STAT (SET/KITS/TRAYS/PACK) ×2 IMPLANT
WATER STERILE IRR 1000ML POUR (IV SOLUTION) ×2 IMPLANT

## 2022-06-17 NOTE — Progress Notes (Signed)
Orthopedic Tech Progress Note Patient Details:  Christine Higgins [birth year] [id number]  PACU RN called requesting a LSO BRACE   Ortho Devices Type of Ortho Device: Lumbar corsett Ortho Device/Splint Location: BACK Ortho Device/Splint Interventions: Ordered   Post Interventions Patient Tolerated: Well Instructions Provided: Care of device  Janit Pagan 06/17/2022, 3:21 PM

## 2022-06-17 NOTE — Progress Notes (Signed)
Pharmacy Antibiotic Note  Christine Higgins is a 66 y.o. female admitted on 06/17/2022 with surgical prophylaxis.  Pharmacy has been consulted for vancomycin dosing 12 hour post-op. Received vancomycin 1000 mg IV x 1 today @ 3435.   Plan: Post-op vancomycin IV 1500 mg dose x 1, no drain Pharmacy will sign off   Height: '5\' 11"'$  (180.3 cm) Weight: 122.5 kg (270 lb) IBW/kg (Calculated) : 70.8  Temp (24hrs), Avg:98.2 F (36.8 C), Min:98 F (36.7 C), Max:98.3 F (36.8 C)  Estimated Creatinine Clearance: 98.6 mL/min (by C-G formula based on SCr of 0.68 mg/dL).    Allergies  Allergen Reactions   Cephalexin Hives   Ibuprofen Nausea And Vomiting    Flu like symptoms   Potassium Chloride Itching   Shellfish Allergy Nausea And Vomiting   Aspirin Nausea And Vomiting    Thank you for allowing [place] to participate in this patients care. Jens Som, PharmD 06/17/2022 4:29 PM  **Pharmacist phone directory can be found on [email or web address] listed under [place]**

## 2022-06-17 NOTE — Anesthesia Postprocedure Evaluation (Signed)
Anesthesia Post Note  Patient: Christine Higgins  Procedure(s) Performed: Posterior Lumbar Interbody Fusion,Interbody Prosthesis,POSTERIOR INSTRUMENTATION Lumbar Three - Four ;Right LumbarTwo - Three DISCECTOMY; BILATERAL Lumbar Four-Five Lamintomy,Foraminotomy     Patient location during evaluation: PACU Anesthesia Type: General Level of consciousness: awake and alert Pain management: pain level controlled Vital Signs Assessment: post-procedure vital signs reviewed and stable Respiratory status: spontaneous breathing, nonlabored ventilation, respiratory function stable and patient connected to nasal cannula oxygen Cardiovascular status: blood pressure returned to baseline and stable Postop Assessment: no apparent nausea or vomiting Anesthetic complications: no   No notable events documented.  Last Vitals:  Vitals:   06/17/22 1500 06/17/22 1559  BP: 116/66 127/79  Pulse: 83 90  Resp: 12 18  Temp: 36.8 C 36.7 C  SpO2: 97% 100%    Last Pain:  Vitals:   06/17/22 1600  TempSrc:   PainSc: 9                  Ashrita Chrismer,W. EDMOND

## 2022-06-17 NOTE — Transfer of Care (Signed)
Immediate Anesthesia Transfer of Care Note  Patient: Walker Shadow  Procedure(s) Performed: Posterior Lumbar Interbody Fusion,Interbody Prosthesis,POSTERIOR INSTRUMENTATION Lumbar Three - Four ;Right LumbarTwo - Three DISCECTOMY; BILATERAL Lumbar Four-Five Lamintomy,Foraminotomy  Patient Location: PACU  Anesthesia Type:General  Level of Consciousness: awake, drowsy and patient cooperative  Airway & Oxygen Therapy: Patient Spontanous Breathing and Patient connected to face mask oxygen  Post-op Assessment: Report given to RN and Post -op Vital signs reviewed and stable  Post vital signs: Reviewed and stable  Last Vitals:  Vitals Value Taken Time  BP 112/76 06/17/22 1238  Temp    Pulse 93 06/17/22 1239  Resp 18 06/17/22 1239  SpO2 97 % 06/17/22 1239  Vitals shown include unvalidated device data.  Last Pain:  Vitals:   06/17/22 0603  TempSrc: Oral  PainSc: 8       Patients Stated Pain Goal: 0 (78/67/67 2094)  Complications: No notable events documented.

## 2022-06-17 NOTE — H&P (Signed)
Subjective:  The patient is a 66 year old black female on whom I previously performed lumbar surgery.  She is developed recurrent back and left great and right leg pain consistent with lumbar radiculopathy.  She has failed medical management.  She was worked up with a lumbar myelo CT which demonstrated spinal listhesis, spinal stenosis, etc.  I discussed the various treatment options with her.  She has decided to proceed with surgery.  Past Medical History:  Diagnosis Date   Anxiety    Asthma    Breast mass    LEFT x 3 months per pt and palpated by physician   Cervical disc disease    Chronic pain syndrome    COPD (chronic obstructive pulmonary disease) (HCC)    Degenerative disc disease, lumbar    osteoarthritis   Depression    Diabetes mellitus without complication (HCC)    Fibromyalgia    Fibromyalgia    GERD (gastroesophageal reflux disease)    Glaucoma    Graves disease    Headache    migraines   Hemorrhoids    History of hiatal hernia    Hyperlipidemia    Hypertension    Hyperthyroidism    Lumbar disc disease    Obesity    Pre-diabetes    Psoriatic arthritis ([place])    Thyroid disease     Past Surgical History:  Procedure Laterality Date   BACK SURGERY     sumbar   CARDIAC CATHETERIZATION  2006   carpel tunn Right    carpel tunnel Left    CATARACT EXTRACTION Bilateral 2022   CESAREAN SECTION     COLONOSCOPY     COLONOSCOPY WITH PROPOFOL N/A 10/20/2020   Procedure: COLONOSCOPY WITH PROPOFOL;  Surgeon: Lesly Rubenstein, MD;  Location: [facility] ENDOSCOPY;  Service: Endoscopy;  Laterality: N/A;   ESOPHAGOGASTRODUODENOSCOPY (EGD) WITH PROPOFOL N/A 06/26/2017   Procedure: ESOPHAGOGASTRODUODENOSCOPY (EGD) WITH PROPOFOL;  Surgeon: Lollie Sails, MD;  Location: [facility] ENDOSCOPY;  Service: Endoscopy;  Laterality: N/A;   EXCISION MORTON'S NEUROMA Left 02/05/2017   Procedure: EXCISION MORTON'S NEUROMA;  Surgeon: Samara Deist, DPM;  Location: [place];   Service: Podiatry;  Laterality: Left;  iva with local   FRACTURE SURGERY  1969   Left Elbow   HAND SURGERY Right    scar tissue removal   JOINT REPLACEMENT     right hip arthroplasty 08/25/15   KNEE ARTHROPLASTY Left 08/11/2017   Procedure: COMPUTER ASSISTED TOTAL KNEE ARTHROPLASTY;  Surgeon: Dereck Leep, MD;  Location: [facility] ORS;  Service: Orthopedics;  Laterality: Left;   KNEE ARTHROPLASTY Right 02/05/2021   Procedure: COMPUTER ASSISTED TOTAL KNEE ARTHROPLASTY;  Surgeon: Dereck Leep, MD;  Location: [facility] ORS;  Service: Orthopedics;  Laterality: Right;   KNEE ARTHROSCOPY Right 07/02/2018   Procedure: ARTHROSCOPY KNEE, Medial and Lateral  Chondroplasty;  Surgeon: Dereck Leep, MD;  Location: [facility] ORS;  Service: Orthopedics;  Laterality: Right;   NECK SURGERY     "disk implant"   REVERSE SHOULDER ARTHROPLASTY Left 06/24/2019   Procedure: REVERSE SHOULDER ARTHROPLASTY;  Surgeon: Corky Mull, MD;  Location: [facility] ORS;  Service: Orthopedics;  Laterality: Left;   SHOULDER SURGERY Right    spur removal   SPINAL CORD STIMULATOR REMOVAL N/A 04/09/2021   Procedure: REMOVAL SPINAL CORD STIMULATOR & PULSE GENERATOR;  Surgeon: [name], MD;  Location: [facility] ORS;  Service: Neurosurgery;  Laterality: N/A;  2nd case   THORACIC LAMINECTOMY FOR SPINAL CORD STIMULATOR N/A 08/07/2020   Procedure: THORACIC SPINAL  CORD STIMULATOR VIA LAMINECTOMY, PULSE GENERATOR;  Surgeon: [name], MD;  Location: [facility] ORS;  Service: Neurosurgery;  Laterality: N/A;   TOTAL HIP ARTHROPLASTY Right    TUBAL LIGATION      Allergies  Allergen Reactions   Cephalexin Hives   Ibuprofen Nausea And Vomiting    Flu like symptoms   Potassium Chloride Itching   Shellfish Allergy Nausea And Vomiting   Aspirin Nausea And Vomiting    Social History   Tobacco Use   Smoking status: Former    Types: Cigarettes    Quit date: 1994    Years since quitting: 29.8   Smokeless tobacco: Never  Substance Use Topics    Alcohol use: No    Alcohol/week: 0.0 standard drinks of alcohol    Family History  Problem Relation Age of Onset   Cancer Mother    Heart disease Father    Breast cancer Neg Hx    Prior to Admission medications   Medication Sig Start Date End Date Taking? Authorizing Provider  acetaminophen (TYLENOL) 325 MG tablet Take 650 mg by mouth every 4 (four) hours as needed for headache (fever up to 101 F, minor discomfort).   Yes [provider]  acetaminophen (TYLENOL) 500 MG tablet Take 2 tablets (1,000 mg total) by mouth every 6 (six) hours. Patient taking differently: Take 1,000 mg by mouth every 6 (six) hours as needed (pain). 08/08/20  Yes Zdeb, Altha Harm, NP  acetaminophen (TYLENOL) 650 MG CR tablet Take 1,300 mg by mouth in the morning and at bedtime.   Yes [provider]  albuterol (VENTOLIN HFA) 108 (90 Base) MCG/ACT inhaler Inhale 2 puffs into the lungs every 4 (four) hours as needed for shortness of breath or wheezing. 01/24/22  Yes [provider]  alum & mag hydroxide-simeth (ANTACID LIQUID) 200-200-20 MG/5ML suspension Take 30 mLs by mouth 4 (four) times daily as needed for indigestion or heartburn. Notify provider if symptoms persist for more than 3 days   Yes [provider]  atorvastatin (LIPITOR) 10 MG tablet Take 10 mg by mouth daily after supper.    Yes [provider]  budesonide-formoterol (SYMBICORT) 160-4.5 MCG/ACT inhaler Inhale 2 puffs into the lungs 2 (two) times daily.   Yes [provider]  calcium carbonate (OS-CAL) 600 MG TABS tablet Take 600 mg by mouth every other day.   Yes [provider]  celecoxib (CELEBREX) 200 MG capsule Take 200 mg by mouth 2 (two) times daily.   Yes [provider]  Cholecalciferol (VITAMIN D3 SUPER STRENGTH) 50 MCG (2000 UT) TABS Take 3,000 Units by mouth daily.   Yes [provider]  clonazePAM (KLONOPIN) 0.5 MG tablet Take 0.5 mg by mouth at bedtime as needed for  anxiety. 05/21/22  Yes [provider]  clotrimazole-betamethasone (LOTRISONE) cream Apply 1 Application topically 2 (two) times daily.   Yes [provider]  [employer] Extract (NEUTROGENA T/GEL EX) Apply 1 application  topically daily as needed (dandruff).   Yes [provider]  Cyanocobalamin (B-12) 2500 MCG TABS Take 5,000 mcg by mouth every other day.   Yes [provider]  diphenhydrAMINE-zinc acetate (BENADRYL ITCH STOPPING) cream Apply 1 application  topically 3 (three) times daily as needed for itching.   Yes [provider]  DULoxetine (CYMBALTA) 30 MG capsule Take 30 mg by mouth at bedtime.   Yes [provider]  DULoxetine (CYMBALTA) 60 MG capsule Take 60 mg by mouth daily.   Yes [provider]  Emollient (GOLD BOND HEALING) LOTN Apply 1 application  topically daily. May keep at bedside.   Yes [provider]  esomeprazole (NEXIUM) 40 MG capsule Take 40 mg by mouth 2 (two) times daily.    Yes [provider]  fluticasone (FLONASE) 50 MCG/ACT nasal spray Place 2 sprays into both nostrils daily. 10/11/19  Yes [provider]  gabapentin (NEURONTIN) 600 MG tablet Take 1,200 mg by mouth in the morning, at noon, and at bedtime. 05/10/22  Yes [provider]  hydrALAZINE (APRESOLINE) 25 MG tablet Take 25 mg by mouth 3 (three) times daily as needed (blood pressure 160/100 or higher).    Yes [provider]  hydrOXYzine (ATARAX) 10 MG tablet Take 10-30 mg by mouth at bedtime as needed for itching.   Yes [provider]  ibandronate (BONIVA) 150 MG tablet Take 150 mg by mouth every 30 (thirty) days. 03/06/22  Yes [provider]  levocetirizine (XYZAL) 5 MG tablet Take 5 mg by mouth every evening.   Yes [provider]  linaclotide (LINZESS) 290 MCG CAPS capsule Take 290 mcg by mouth daily.   Yes [provider]  loperamide (IMODIUM A-D) 2 MG tablet Take 4 mg  by mouth 4 (four) times daily as needed for diarrhea or loose stools.   Yes [provider]  losartan (COZAAR) 100 MG tablet Take 100 mg by mouth daily. 05/10/22  Yes [provider]  magnesium hydroxide (MILK OF MAGNESIA) 400 MG/5ML suspension Take 30 mLs by mouth daily as needed (constipation).   Yes [provider]  menthol-zinc oxide (GOLD BOND) powder Apply 1 application  topically daily as needed (irritation).   Yes [provider]  methimazole (TAPAZOLE) 5 MG tablet Take 5 mg by mouth daily.   Yes [provider]  methocarbamol (ROBAXIN) 750 MG tablet Take 1 tablet (750 mg total) by mouth every 8 (eight) hours as needed for muscle spasms. 07/17/21  Yes [name], MD  Multiple Vitamin (MULTIVITAMIN WITH MINERALS) TABS tablet Take 1 tablet by mouth daily.   Yes [provider]  OVER THE COUNTER MEDICATION Apply 1 application  topically See admin instructions. Barrier Cream. Apply topically after toileting as needed to prevent skin breakdown. May be kept in residents room.   Yes [provider]  potassium chloride (KLOR-CON) 10 MEQ tablet Take 10 mEq by mouth daily. 05/10/22  Yes [provider]  propranolol (INDERAL) 40 MG tablet Take 40 mg by mouth 2 (two) times daily. 02/05/18  Yes [provider]  pyrithione zinc (HEAD AND SHOULDERS) 1 % shampoo Apply 1 application  topically daily as needed for itching (dandruff).   Yes [provider]  RESTASIS 0.05 % ophthalmic emulsion Place 1 drop into both eyes 2 (two) times daily. 04/24/22  Yes [provider]  Skin Protectants, Misc. (BAZA PROTECT EX) Apply 1 application  topically as needed (skin breakdown).   Yes [provider]  sucralfate (CARAFATE) 1 g tablet Take 1 g by mouth 4 (four) times daily -  before meals and at bedtime.   Yes [provider]  torsemide (DEMADEX) 10 MG tablet Take 10 mg by mouth daily. 05/10/22  Yes [provider]  traMADol (ULTRAM) 50 MG tablet Take 50 mg by mouth every 6 (six) hours as needed for severe pain.   Yes [provider]  traZODone (DESYREL) 150 MG tablet Take 75 mg by mouth at bedtime as needed for sleep.  Yes [provider]  Turmeric 500 MG CAPS Take 1,000 mg by mouth every other day.   Yes [provider]  vitamin E 180 MG (400 UNITS) capsule Take 400 Units by mouth daily.   Yes [provider]  ciclopirox (LOPROX) 0.77 % cream Apply to feet, between toes, and around toenails once to twice daily. Patient not taking: Reported on 06/10/2022 11/13/21   Brendolyn Patty, MD  clobetasol (TEMOVATE) 0.05 % external solution Apply 1-2 drops to affected area scalp as needed for itch. Avoid face, groin, axilla. Patient not taking: Reported on 06/10/2022 11/13/21   Brendolyn Patty, MD  clonazePAM (KLONOPIN) 1 MG tablet Take 1 tablet (1 mg total) by mouth at bedtime as needed (for sleep/anxiety). Patient not taking: Reported on 06/10/2022 08/13/17   Latanya Maudlin, NP  diphenhydrAMINE (BENADRYL) 25 mg capsule Take 25 mg by mouth 4 (four) times daily as needed for itching or allergies.    [provider]  diphenhydrAMINE (BENADRYL) 25 MG tablet Take 25 mg by mouth at bedtime as needed for sleep or itching.    [provider]  gabapentin (NEURONTIN) 300 MG capsule Take 1 capsule (300 mg total) by mouth 3 (three) times daily. Patient not taking: Reported on 06/10/2022 04/24/21   [name], MD  guaiFENesin (ROBITUSSIN) 100 MG/5ML liquid Take 15 mLs by mouth every 6 (six) hours as needed for cough.    [provider]  ketoconazole (NIZORAL) 2 % shampoo MASSAGE INTO SCALP AND LET SIT 10-15 MINS BEFORE RINSING. USE 2X/WEEK. Patient not taking: Reported on 06/10/2022 05/06/22   Brendolyn Patty, MD  tamsulosin [facility]) 0.4 MG CAPS capsule Take 1 capsule (0.4 mg total) by mouth daily after breakfast. Patient not taking: Reported on  06/10/2022 05/09/22   Abbie Sons, MD  terbinafine (LAMISIL) 250 MG tablet Take 1 tablet (250 mg total) by mouth daily. Take with food. Patient not taking: Reported on 05/14/2022 12/18/21   Brendolyn Patty, MD  triamcinolone cream (KENALOG) 0.1 % Apply to affected area rash on abdomen 1-2 times a day as needed for itch. Avoid face, groin, axilla. Patient not taking: Reported on 06/10/2022 12/18/21   Brendolyn Patty, MD     Review of Systems  Positive ROS: As above  All other systems have been reviewed and were otherwise negative with the exception of those mentioned in the HPI and as above.  Objective: Vital signs in last 24 hours: Temp:  [98.3 F (36.8 C)] 98.3 F (36.8 C) (10/23 0603) Pulse Rate:  [77] 77 (10/23 0603) Resp:  [18] 18 (10/23 0603) BP: (155)/(86) 155/86 (10/23 0603) SpO2:  [96 %] 96 % (10/23 0603) Weight:  [122.5 kg] 122.5 kg (10/23 0603) Estimated body mass index is 37.66 kg/m as calculated from the following:   Height as of this encounter: '5\' 11"'$  (1.803 m).   Weight as of this encounter: 122.5 kg.   General Appearance: Alert, obese Head: Normocephalic, without obvious abnormality, atraumatic Eyes: PERRL, conjunctiva/corneas clear, EOM's intact,    Ears: Normal  Throat: Normal  Neck: Supple, Back: Lumbar incision is well-healed.   Lungs: Clear to auscultation bilaterally, respirations unlabored Heart: Regular rate and rhythm, no murmur, rub or gallop Abdomen: Soft, non-tender Extremities: Extremities normal, atraumatic, no cyanosis or edema Skin: unremarkable  NEUROLOGIC:   Mental status: alert and oriented,Motor Exam - grossly normal Sensory Exam - grossly normal Reflexes:  Coordination - grossly normal Gait - grossly normal Balance - grossly normal Cranial Nerves: I: smell Not  tested  II: visual acuity  OS: Normal  OD: Normal   II: visual fields Full to confrontation  II: pupils Equal, round, reactive to light  III,VII: ptosis None  III,IV,VI:  extraocular muscles  Full ROM  V: mastication Normal  V: facial light touch sensation  Normal  V,VII: corneal reflex  Present  VII: facial muscle function - upper  Normal  VII: facial muscle function - lower Normal  VIII: hearing Not tested  IX: soft palate elevation  Normal  IX,X: gag reflex Present  XI: trapezius strength  5/5  XI: sternocleidomastoid strength 5/5  XI: neck flexion strength  5/5  XII: tongue strength  Normal    Data Review Lab Results  Component Value Date   WBC 2.6 (L) 06/10/2022   HGB 12.3 06/10/2022   HCT 37.7 06/10/2022   MCV 85.5 06/10/2022   PLT 184 06/10/2022   Lab Results  Component Value Date   NA 139 06/10/2022   K 4.0 06/10/2022   CL 103 06/10/2022   CO2 28 06/10/2022   BUN 7 (L) 06/10/2022   CREATININE 0.68 06/10/2022   GLUCOSE 110 (H) 06/10/2022   Lab Results  Component Value Date   INR 0.9 01/25/2021    Assessment/Plan: Lumbar spondylolisthesis, spinal stenosis, herniated disc, lumbar radiculopathy, lumbago, neurogenic claudication: I discussed situation with the patient.  I reviewed her imaging studies with her and pointed out the abnormalities.  We discussed the various treatment options including surgery.  I have described the surgical treatment of an L3-4 decompression instrumentation fusion with a right L2-3 discectomy and bilateral L4-5 laminotomies.  I have shown her surgical models.  I have given her a surgical pamphlet.  We have discussed the risk, benefits, alternatives, expected postop course, and likelihood of achieving her goals with surgery.  I have answered all her questions.  She has decided proceed with surgery.   Ophelia Charter 06/17/2022 7:24 AM

## 2022-06-17 NOTE — Op Note (Signed)
Brief history: The patient is a 66 year old white female whose had several prior back surgery.  She complains of back and leg pain.  She failed medical management.  She was worked up with a lumbar myelo CT which demonstrated an L3-4 spondylolisthesis with spinal stenosis/herniated disc.  I discussed the various treatment options with her.  She has decided proceed with surgery.  Preoperative diagnosis: Right L2-3 laminotomy/foraminotomies; bilateral L4-5 laminotomy/foraminotomies; L3-4 spondylolisthesis, degenerative disc disease, spinal stenosis compressing both the L3 and the L4 nerve roots; lumbago; lumbar radiculopathy; neurogenic claudication  Postoperative diagnosis: The same  Procedure: Right L2-3, bilateral redo L3-4 and L4-5 laminotomy/foraminotomies/medial facetectomy to decompress the right L3 and bilateral L4 and L5 nerve roots(the work required to do this was in addition to the work required to do the posterior lumbar interbody fusion because of the patient's spinal stenosis, facet arthropathy. Etc. requiring a wide decompression of the nerve roots.);  Left L3-4 transforaminal lumbar interbody fusion with local morselized autograft bone and Zimmer DBM; insertion of interbody prosthesis at L3-4 (globus peek expandable interbody prosthesis); posterior nonsegmental instrumentation from L3 to L4 with globus titanium pedicle screws and rods; posterior lateral arthrodesis at L3-4 with local morselized autograft bone and Zimmer DBM.  Surgeon: Dr. Earle Gell  Asst.: Arnetha Massy, NP  Anesthesia: Gen. endotracheal  Estimated blood loss: 300 cc  Drains: None  Complications: None  Description of procedure: The patient was brought to the operating room by the anesthesia team. General endotracheal anesthesia was induced. The patient was turned to the prone position on the Wilson frame. The patient's lumbosacral region was then prepared with Betadine scrub and Betadine solution. Sterile drapes  were applied.  I then injected the area to be incised with Marcaine with epinephrine solution. I then used the scalpel to make a linear midline incision over the L2-3, L3-4 and L4-5 interspace. I then used electrocautery to perform a bilateral subperiosteal dissection exposing the spinous process and lamina of L2-3, L3-4 and L4-5. We then obtained intraoperative radiograph to confirm our location. We then inserted the Verstrac retractor to provide exposure.  I began the decompression by using the high speed drill to perform laminotomies at L2-3 on the right, bilaterally at the L3-4 and L4-5. We then used the Kerrison punches to widen the laminotomies on the right at L2-3 and bilaterally at L3-4 and L4-5 and removed the ligamentum flavum at L2-3 on the right later today L4-5 bilaterally, I removed the epidural scar tissue at L3-4 bilaterally discharged. We used the Kerrison punches to remove the medial facets at L3-4 bilaterally, we removed the left L3-4 facet.. We performed wide foraminotomies about the right L3 bilateral L4 and L5 nerve roots completing the decompression.  We now turned our attention to the posterior lumbar interbody fusion. I used a scalpel to incise the intervertebral disc at L3-4 bilaterally. I then performed a partial intervertebral discectomy at L3-4 bilaterally using the pituitary forceps. We prepared the vertebral endplates at E9-9 bilaterally for the fusion by removing the soft tissues with the curettes. We then used the trial spacers to pick the appropriate sized interbody prosthesis. We prefilled his prosthesis with a combination of local morselized autograft bone that we obtained during the decompression as well as Zimmer DBM. We inserted the prefilled prosthesis into the interspace at L3-4 from the left, we then turned and expanded the prosthesis. There was a good snug fit of the prosthesis in the interspace. We then filled and the remainder of the intervertebral disc space  with  local morselized autograft bone and Zimmer DBM. This completed the posterior lumbar interbody arthrodesis.  During the decompression and insertion of the prosthesis the assistant protected the thecal sac and nerve roots with the D'Errico retractor.  We now turned attention to the instrumentation. Under fluoroscopic guidance we cannulated the bilateral L3 and L4 pedicles with the bone probe. We then removed the bone probe. We then tapped the pedicle with a 6.5 millimeter tap. We then removed the tap. We probed inside the tapped pedicle with a ball probe to rule out cortical breaches. We then inserted a 7.5 x 50 millimeter pedicle screw into the L3 and L4 pedicles bilaterally under fluoroscopic guidance. We then palpated along the medial aspect of the pedicles to rule out cortical breaches. There were none. The nerve roots were not injured. We then connected the unilateral pedicle screws with a lordotic rod. We compressed the construct and secured the rod in place with the caps. We then tightened the caps appropriately. This completed the instrumentation from L3-4 bilaterally.  We now turned our attention to the posterior lateral arthrodesis at L3-4. We used the high-speed drill to decorticate the remainder of the facets, pars, transverse process at L3-4. We then applied a combination of local morselized autograft bone and Zimmer DBM over these decorticated posterior lateral structures. This completed the posterior lateral arthrodesis.  We then obtained hemostasis using bipolar electrocautery. We irrigated the wound out with saline solution. We inspected the thecal sac and nerve roots and noted they were well decompressed. We then removed the retractor.  We injected Exparel . We reapproximated patient's thoracolumbar fascia with interrupted #1 Vicryl suture. We reapproximated patient's subcutaneous tissue with interrupted 2-0 Vicryl suture. The reapproximated patient's skin with Steri-Strips and benzoin. The  wound was then coated with bacitracin ointment. A sterile dressing was applied. The drapes were removed. The patient was subsequently returned to the supine position where they were extubated by the anesthesia team. He was then transported to the post anesthesia care unit in stable condition. All sponge instrument and needle counts were reportedly correct at the end of this case.

## 2022-06-17 NOTE — Anesthesia Procedure Notes (Signed)
Procedure Name: Intubation Date/Time: 06/17/2022 7:47 AM  Performed by: Dorthea Cove, CRNAPre-anesthesia Checklist: Patient identified, Emergency Drugs available, Suction available and Patient being monitored Patient Re-evaluated:Patient Re-evaluated prior to induction Oxygen Delivery Method: Circle system utilized Preoxygenation: Pre-oxygenation with 100% oxygen Induction Type: IV induction Ventilation: Mask ventilation without difficulty Laryngoscope Size: Glidescope and 4 Grade View: Grade I Tube type: Oral Tube size: 7.5 mm Number of attempts: 1 Airway Equipment and Method: Stylet and Oral airway Placement Confirmation: ETT inserted through vocal cords under direct vision, positive ETCO2 and breath sounds checked- equal and bilateral Secured at: 23 cm Tube secured with: Tape Dental Injury: Teeth and Oropharynx as per pre-operative assessment  Comments: Elective glidescope due to body habitus and poor positioning due to stretcher intubation

## 2022-06-18 ENCOUNTER — Ambulatory Visit (INDEPENDENT_AMBULATORY_CARE_PROVIDER_SITE_OTHER): Payer: Medicare HMO | Admitting: Vascular Surgery

## 2022-06-18 ENCOUNTER — Encounter (INDEPENDENT_AMBULATORY_CARE_PROVIDER_SITE_OTHER): Payer: Medicare HMO

## 2022-06-18 LAB — GLUCOSE, CAPILLARY
Glucose-Capillary: 121 mg/dL — ABNORMAL HIGH (ref 70–99)
Glucose-Capillary: 133 mg/dL — ABNORMAL HIGH (ref 70–99)

## 2022-06-18 MED ORDER — DOXYCYCLINE HYCLATE 100 MG PO TABS
100.0000 mg | ORAL_TABLET | Freq: Two times a day (BID) | ORAL | Status: DC
  Administered 2022-06-18: 100 mg via ORAL
  Filled 2022-06-18 (×2): qty 1

## 2022-06-18 MED ORDER — OXYCODONE-ACETAMINOPHEN 5-325 MG PO TABS: 1.0000 | ORAL_TABLET | ORAL | 0 refills | Status: DC | PRN

## 2022-06-18 MED ORDER — OXYCODONE-ACETAMINOPHEN 5-325 MG PO TABS
1.0000 | ORAL_TABLET | ORAL | Status: DC | PRN
  Administered 2022-06-18: 2 via ORAL
  Filled 2022-06-18: qty 2

## 2022-06-18 MED ORDER — DOXYCYCLINE HYCLATE 100 MG PO TABS: 100.0000 mg | ORAL_TABLET | Freq: Two times a day (BID) | ORAL | 0 refills | Status: DC

## 2022-06-18 MED ORDER — DOCUSATE SODIUM 100 MG PO CAPS: 100.0000 mg | ORAL_CAPSULE | Freq: Two times a day (BID) | ORAL | 0 refills | Status: DC

## 2022-06-18 MED FILL — Thrombin For Soln 5000 Unit: CUTANEOUS | Qty: 5000 | Status: AC

## 2022-06-18 NOTE — Evaluation (Signed)
Occupational Therapy Evaluation Patient Details Name: Christine Higgins MRN: [id number] DOB: 01/25/1956 Today's Date: 06/18/2022   History of Present Illness Christine Higgins is a 66 yo female who underwent L3-4 decompression instrumentation fusion with myotomy's at L2-3 and L4-5 10/23. PMHx: anxiety, COPD, DM, fibromyalgia, HLD, HTN, hyperthyroidism   Clinical Impression   Christine Higgins was evaluated s/p the above admission list, she lives at and ALF, staff assists with ADLs and IADLs as needed, and she ambulated with a RW. Upon evaluation pt had functional limitations due to pain, generalized weakness, decreased activity tolerance and knowledge of compensatory techniques to maintain back precautions. Overall she required superivsion and cues for all transfers and mobility with RW. Pt also required up to mod A for ADLs. Pt reports the staff at there ALF will assist with IADLs and ADLs as needed upon d/c. She does not have further acute OT needs.    Recommendations for follow up therapy are one component of a multi-disciplinary discharge planning process, led by the attending physician.  Recommendations may be updated based on patient status, additional functional criteria and insurance authorization.   Follow Up Recommendations  No OT follow up    Assistance Recommended at Discharge Intermittent Supervision/Assistance  Patient can return home with the following A little help with walking and/or transfers;A little help with bathing/dressing/bathroom;Assistance with cooking/housework;Assist for transportation;Help with stairs or ramp for entrance    Functional Status Assessment  Patient has had a recent decline in their functional status and demonstrates the ability to make significant improvements in function in a reasonable and predictable amount of time.  Equipment Recommendations  Other (comment) Agricultural consultant)    Recommendations for Other Services       Precautions / Restrictions  Precautions Precautions: Fall;Back Precaution Booklet Issued: Yes (comment) Required Braces or Orthoses: Spinal Brace Spinal Brace: Lumbar corset Restrictions Weight Bearing Restrictions: No      Mobility Bed Mobility Overal bed mobility: Needs Assistance             General bed mobility comments: OOB upon arrival, verbally reviewed log roll    Transfers Overall transfer level: Needs assistance Equipment used: Rolling walker (2 wheels) Transfers: Sit to/from Stand Sit to Stand: Supervision                  Balance                                           ADL either performed or assessed with clinical judgement   ADL Overall ADL's : Needs assistance/impaired Eating/Feeding: Independent;Sitting   Grooming: Supervision/safety;Cueing for safety;Cueing for compensatory techniques;Standing   Upper Body Bathing: Set up;Sitting   Lower Body Bathing: Moderate assistance;Sit to/from stand   Upper Body Dressing : Set up;Sitting Upper Body Dressing Details (indicate cue type and reason): min A for back brace Lower Body Dressing: Moderate assistance;Sit to/from stand   Toilet Transfer: Supervision/safety;Rolling walker (2 wheels);Cueing for safety   Toileting- Clothing Manipulation and Hygiene: Supervision/safety;Sitting/lateral lean       Functional mobility during ADLs: Supervision/safety;Cueing for safety;Rolling walker (2 wheels) General ADL Comments: pt likely near her baseline, cues needed for back precautions     Vision Baseline Vision/History: 0 No visual deficits Vision Assessment?: No apparent visual deficits     Perception     Praxis      Pertinent Vitals/Pain Pain Assessment Pain Assessment: Faces Faces  Pain Scale: Hurts little more Pain Location: back Pain Descriptors / Indicators: Discomfort Pain Intervention(s): Monitored during session     Hand Dominance Right   Extremity/Trunk Assessment Upper Extremity  Assessment Upper Extremity Assessment: Generalized weakness   Lower Extremity Assessment Lower Extremity Assessment: Defer to PT evaluation   Cervical / Trunk Assessment Cervical / Trunk Assessment: Back Surgery   Communication Communication Communication: No difficulties (verbose)   Cognition Arousal/Alertness: Awake/alert Behavior During Therapy: WFL for tasks assessed/performed Overall Cognitive Status: Within Functional Limits for tasks assessed                                 General Comments: 3/3 recall of precautions. Verbose and self-distracting throughout, impulsivity noted with fatigue     General Comments  VSS on RA, daughter present and supportive    Exercises     Shoulder Instructions      Home Living Family/patient expects to be discharged to:: Assisted living                             Home Equipment: Rolling Walker (2 wheels);Shower seat;Grab bars - tub/shower;BSC/3in1;Adaptive equipment Adaptive Equipment: Reacher Additional Comments: no steps      Prior Functioning/Environment Prior Level of Function : Needs assist       Physical Assist : Mobility (physical);ADLs (physical)     Mobility Comments: RW ADLs Comments: Staff assists with LB ADLs and IADLs        OT Problem List: Decreased strength;Decreased range of motion;Decreased activity tolerance;Decreased knowledge of use of DME or AE;Decreased knowledge of precautions;Pain      OT Treatment/Interventions:      OT Goals(Current goals can be found in the care plan section) Acute Rehab OT Goals Patient Stated Goal: to play with grandkids again OT Goal Formulation: With patient Time For Goal Achievement: 06/18/22 Potential to Achieve Goals: Good  OT Frequency:      Co-evaluation              AM-PAC OT "6 Clicks" Daily Activity     Outcome Measure Help from another person eating meals?: None Help from another person taking care of personal grooming?: A  Little Help from another person toileting, which includes using toliet, bedpan, or urinal?: A Little Help from another person bathing (including washing, rinsing, drying)?: A Lot Help from another person to put on and taking off regular upper body clothing?: A Little Help from another person to put on and taking off regular lower body clothing?: A Lot 6 Click Score: 17   End of Session Equipment Utilized During Treatment: Rolling walker (2 wheels);Back brace Nurse Communication: Mobility status (DME)  Activity Tolerance: Patient tolerated treatment well Patient left: in bed;with call bell/phone within reach  OT Visit Diagnosis: Unsteadiness on feet (R26.81);Other abnormalities of gait and mobility (R26.89);Muscle weakness (generalized) (M62.81);Pain                Time: 0254-2706 OT Time Calculation (min): 20 min Charges:  OT General Charges $OT Visit: 1 Visit OT Evaluation $OT Eval Moderate Complexity: 1 Mod    Lorne Winkels D Causey 06/18/2022, 9:16 AM

## 2022-06-18 NOTE — Discharge Summary (Signed)
Physician Discharge Summary  Patient ID: Christine Higgins MRN: [id number] DOB/AGE: 67-14-1957 67 y.o.  Admit date: 06/17/2022 Discharge date: 06/18/2022  Admission Diagnoses: Lumbar spinal stasis, lumbar spinal stenosis, lumbar radiculopathy, neurogenic claudication, lumbago  Discharge Diagnoses: The same Principal Problem:   Spondylolisthesis of lumbar region   Discharged Condition: good  Hospital Course: I performed an L3-4 decompression instrumentation fusion with myotomy's at L2-3 and L4-5 on the patient on 06/17/2022.  The surgery went well.  The patient's postoperative course was unremarkable.  On postoperative day #1 she requested discharge to home.  She was given verbal and written discharge instructions.  The patient had some drainage from her wound so she was instructed on dressing changes and started on empiric doxycycline.  All the patient's, and her daughter's, questions were answered.  Consults: PT, OT, care management Significant Diagnostic Studies: None Treatments: L3-4 decompression instrumentation fusion, L2-3 and L4-5 laminotomies foraminotomies Discharge Exam: Blood pressure 112/62, pulse 86, temperature 99.3 F (37.4 C), temperature source Oral, resp. rate 20, height '5\' 11"'$  (1.803 m), weight 122.5 kg, SpO2 96 %. The patient is alert and pleasant.  She looks well.  Her dressing is clean and dry.  Her strength is normal.  Her leg pain is gone.  Disposition: Home  Discharge Instructions     Call MD for:  difficulty breathing, headache or visual disturbances   Complete by: As directed    Call MD for:  extreme fatigue   Complete by: As directed    Call MD for:  hives   Complete by: As directed    Call MD for:  persistant dizziness or light-headedness   Complete by: As directed    Call MD for:  persistant nausea and vomiting   Complete by: As directed    Call MD for:  redness, tenderness, or signs of infection (pain, swelling, redness, odor or green/yellow  discharge around incision site)   Complete by: As directed    Call MD for:  severe uncontrolled pain   Complete by: As directed    Call MD for:  temperature >100.4   Complete by: As directed    Diet - low sodium heart healthy   Complete by: As directed    Discharge instructions   Complete by: As directed    Call [phone] for a followup appointment. Take a stool softener while you are using pain medications.   Driving Restrictions   Complete by: As directed    Do not drive for 2 weeks.   Increase activity slowly   Complete by: As directed    Lifting restrictions   Complete by: As directed    Do not lift more than 5 pounds. No excessive bending or twisting.   May shower / Bathe   Complete by: As directed    Remove the dressing for 3 days after surgery.  You may shower, but leave the incision alone.   Remove dressing in 48 hours   Complete by: As directed       Allergies as of 06/18/2022       Reactions   Cephalexin Hives   Ibuprofen Nausea And Vomiting   Flu like symptoms   Potassium Chloride Itching   Shellfish Allergy Nausea And Vomiting   Aspirin Nausea And Vomiting        Medication List     STOP taking these medications    acetaminophen 325 MG tablet Commonly known as: TYLENOL   acetaminophen 500 MG tablet Commonly known as: TYLENOL  acetaminophen 650 MG CR tablet Commonly known as: TYLENOL       TAKE these medications    albuterol 108 (90 Base) MCG/ACT inhaler Commonly known as: VENTOLIN HFA Inhale 2 puffs into the lungs every 4 (four) hours as needed for shortness of breath or wheezing.   Antacid Liquid 761-950-93 MG/5ML suspension Generic drug: alum & mag hydroxide-simeth Take 30 mLs by mouth 4 (four) times daily as needed for indigestion or heartburn. Notify provider if symptoms persist for more than 3 days   atorvastatin 10 MG tablet Commonly known as: LIPITOR Take 10 mg by mouth daily after supper.   B-12 2500 MCG Tabs Take 5,000  mcg by mouth every other day.   BAZA PROTECT EX Apply 1 application  topically as needed (skin breakdown).   Benadryl Itch Stopping cream Generic drug: diphenhydrAMINE-zinc acetate Apply 1 application  topically 3 (three) times daily as needed for itching.   budesonide-formoterol 160-4.5 MCG/ACT inhaler Commonly known as: SYMBICORT Inhale 2 puffs into the lungs 2 (two) times daily.   calcium carbonate 600 MG Tabs tablet Commonly known as: OS-CAL Take 600 mg by mouth every other day.   celecoxib 200 MG capsule Commonly known as: CELEBREX Take 200 mg by mouth 2 (two) times daily.   clonazePAM 0.5 MG tablet Commonly known as: KLONOPIN Take 0.5 mg by mouth at bedtime as needed for anxiety.   diphenhydrAMINE 25 MG tablet Commonly known as: BENADRYL Take 25 mg by mouth at bedtime as needed for sleep or itching.   diphenhydrAMINE 25 mg capsule Commonly known as: BENADRYL Take 25 mg by mouth 4 (four) times daily as needed for itching or allergies.   docusate sodium 100 MG capsule Commonly known as: COLACE Take 1 capsule (100 mg total) by mouth 2 (two) times daily.   doxycycline 100 MG tablet Commonly known as: VIBRA-TABS Take 1 tablet (100 mg total) by mouth every 12 (twelve) hours.   DULoxetine 60 MG capsule Commonly known as: CYMBALTA Take 60 mg by mouth daily.   DULoxetine 30 MG capsule Commonly known as: CYMBALTA Take 30 mg by mouth at bedtime.   esomeprazole 40 MG capsule Commonly known as: NEXIUM Take 40 mg by mouth 2 (two) times daily.   fluticasone 50 MCG/ACT nasal spray Commonly known as: FLONASE Place 2 sprays into both nostrils daily.   gabapentin 600 MG tablet Commonly known as: NEURONTIN Take 1,200 mg by mouth in the morning, at noon, and at bedtime.   Gold Bond Healing Lotn Apply 1 application  topically daily. May keep at bedside.   guaiFENesin 100 MG/5ML liquid Commonly known as: ROBITUSSIN Take 15 mLs by mouth every 6 (six) hours as needed  for cough.   hydrOXYzine 10 MG tablet Commonly known as: ATARAX Take 10-30 mg by mouth at bedtime as needed for itching.   ibandronate 150 MG tablet Commonly known as: BONIVA Take 150 mg by mouth every 30 (thirty) days.   ketoconazole 2 % shampoo Commonly known as: NIZORAL MASSAGE INTO SCALP AND LET SIT 10-15 MINS BEFORE RINSING. USE 2X/WEEK.   levocetirizine 5 MG tablet Commonly known as: XYZAL Take 5 mg by mouth every evening.   linaclotide 290 MCG Caps capsule Commonly known as: LINZESS Take 290 mcg by mouth daily.   loperamide 2 MG tablet Commonly known as: IMODIUM A-D Take 4 mg by mouth 4 (four) times daily as needed for diarrhea or loose stools.   losartan 100 MG tablet Commonly known as: COZAAR Take 100 mg  by mouth daily.   magnesium hydroxide 400 MG/5ML suspension Commonly known as: MILK OF MAGNESIA Take 30 mLs by mouth daily as needed (constipation).   menthol-zinc oxide powder Apply 1 application  topically daily as needed (irritation).   methimazole 5 MG tablet Commonly known as: TAPAZOLE Take 5 mg by mouth daily.   methocarbamol 750 MG tablet Commonly known as: ROBAXIN Take 1 tablet (750 mg total) by mouth every 8 (eight) hours as needed for muscle spasms.   multivitamin with minerals Tabs tablet Take 1 tablet by mouth daily.   NEUTROGENA T/GEL EX Apply 1 application  topically daily as needed (dandruff).   OVER THE COUNTER MEDICATION Apply 1 application  topically See admin instructions. Barrier Cream. Apply topically after toileting as needed to prevent skin breakdown. May be kept in residents room.   oxyCODONE-acetaminophen 5-325 MG tablet Commonly known as: PERCOCET/ROXICET Take 1-2 tablets by mouth every 4 (four) hours as needed for moderate pain.   potassium chloride 10 MEQ tablet Commonly known as: KLOR-CON Take 10 mEq by mouth daily.   propranolol 80 MG tablet Commonly known as: INDERAL Take 80 mg by mouth 2 (two) times daily.    pyrithione zinc 1 % shampoo Commonly known as: HEAD AND SHOULDERS Apply 1 application  topically daily as needed for itching (dandruff).   Restasis 0.05 % ophthalmic emulsion Generic drug: cycloSPORINE Place 1 drop into both eyes 2 (two) times daily.   sucralfate 1 g tablet Commonly known as: CARAFATE Take 1 g by mouth 4 (four) times daily -  before meals and at bedtime.   tamsulosin 0.4 MG Caps capsule Commonly known as: FLOMAX Take 1 capsule (0.4 mg total) by mouth daily after breakfast.   torsemide 10 MG tablet Commonly known as: DEMADEX Take 10 mg by mouth daily.   traMADol 50 MG tablet Commonly known as: ULTRAM Take 50 mg by mouth every 6 (six) hours as needed for severe pain.   traZODone 150 MG tablet Commonly known as: DESYREL Take 75 mg by mouth at bedtime as needed for sleep.   triamcinolone cream 0.1 % Commonly known as: KENALOG Apply to affected area rash on abdomen 1-2 times a day as needed for itch. Avoid face, groin, axilla.   Vitamin D3 Super Strength 50 MCG (2000 UT) Tabs Generic drug: Cholecalciferol Take 3,000 Units by mouth daily.   vitamin E 180 MG (400 UNITS) capsule Take 400 Units by mouth daily.         Signed: Ophelia Charter 06/18/2022, 7:51 AM

## 2022-06-18 NOTE — Progress Notes (Signed)
Patient alert and oriented, voiding adequately, skin clean, dry and intact without evidence of skin break down, or symptoms of complications - no redness or edema noted, only slight tenderness at site.  Patient states pain is manageable at time of discharge. Patient has an appointment with MD in 3 weeks 

## 2022-06-18 NOTE — Evaluation (Signed)
Physical Therapy Evaluation  Patient Details Name: Christine Higgins MRN: [id number] DOB: [birth year] Today's Date: 06/18/2022  History of Present Illness  Christine Higgins is a 66 yo female who underwent L3-4 decompression instrumentation fusion with myotomy's at L2-3 and L4-5 06/17/22. PMHx: anxiety, COPD, DM, fibromyalgia, HLD, HTN, hyperthyroidism   Clinical Impression  Pt admitted with above diagnosis. At the time of PT eval, pt was able to demonstrate transfers and ambulation with gross min guard assist to min assist and rollator for support. Rollator was helpful for pt to take seated rest break in the hallway however required assist to steady rollator for pt to transition to/from sitting. Pt was educated on precautions, brace application/wearing schedule, appropriate activity progression, and car transfer. Pt currently with functional limitations due to the deficits listed below (see PT Problem List). Pt will benefit from skilled PT to increase their independence and safety with mobility to allow discharge to the venue listed below.         Recommendations for follow up therapy are one component of a multi-disciplinary discharge planning process, led by the attending physician.  Recommendations may be updated based on patient status, additional functional criteria and insurance authorization.  Follow Up Recommendations No PT follow up      Assistance Recommended at Discharge Set up Supervision/Assistance  Patient can return home with the following  A little help with walking and/or transfers;A little help with bathing/dressing/bathroom;Assistance with cooking/housework;Assist for transportation;Help with stairs or ramp for entrance    Equipment Recommendations Rollator (4 wheels)  Recommendations for Other Services       Functional Status Assessment Patient has had a recent decline in their functional status and demonstrates the ability to make significant improvements in function in a  reasonable and predictable amount of time.     Precautions / Restrictions Precautions Precautions: Fall;Back Precaution Booklet Issued: Yes (comment) Precaution Comments: Reviewed handout and pt was cued for precautions during functional mobility. Required Braces or Orthoses: Spinal Brace Spinal Brace: Lumbar corset;Applied in sitting position Restrictions Weight Bearing Restrictions: No      Mobility  Bed Mobility Overal bed mobility: Needs Assistance             General bed mobility comments: OOB upon arrival, verbally reviewed log roll    Transfers Overall transfer level: Needs assistance Equipment used: Rollator (4 wheels) Transfers: Sit to/from Stand Sit to Stand: Supervision           General transfer comment: VC's for hand placement on seated surface for safety and to lock brakes of the rollator prior to transfers.    Ambulation/Gait Ambulation/Gait assistance: Min guard, Min assist Gait Distance (Feet): 120 Feet Assistive device: Rollator (4 wheels) Gait Pattern/deviations: Step-through pattern, Decreased stride length, Trunk flexed Gait velocity: Decreased Gait velocity interpretation: 1.31 - 2.62 ft/sec, indicative of limited community ambulator   General Gait Details: VC's for improved posture, closer walker proximity, and foward gaze. Pt ambulating well with the rollator for support however moving slow. Required seated rest break on the rollator.  Stairs            Wheelchair Mobility    Modified Rankin (Stroke Patients Only)       Balance                                             Pertinent Vitals/Pain Pain Assessment Pain  Assessment: Faces Faces Pain Scale: Hurts little more Pain Location: back Pain Descriptors / Indicators: Discomfort Pain Intervention(s): Limited activity within patient's tolerance, Monitored during session, Repositioned    Home Living Family/patient expects to be discharged to:: Assisted  living                 Home Equipment: Conservation officer, nature (2 wheels);Shower seat;Grab bars - tub/shower;BSC/3in1;Adaptive equipment      Prior Function Prior Level of Function : Needs assist       Physical Assist : Mobility (physical);ADLs (physical)     Mobility Comments: RW ADLs Comments: Staff assists with LB ADLs and IADLs     Hand Dominance   Dominant Hand: Right    Extremity/Trunk Assessment   Upper Extremity Assessment Upper Extremity Assessment: Defer to OT evaluation    Lower Extremity Assessment Lower Extremity Assessment: Generalized weakness (Consistent with pre-op diagnosis. Muscular fatigue)    Cervical / Trunk Assessment Cervical / Trunk Assessment: Back Surgery  Communication   Communication: No difficulties  Cognition Arousal/Alertness: Awake/alert Behavior During Therapy: WFL for tasks assessed/performed Overall Cognitive Status: Within Functional Limits for tasks assessed                                          General Comments      Exercises     Assessment/Plan    PT Assessment Patient needs continued PT services  PT Problem List Decreased strength;Decreased range of motion;Decreased activity tolerance;Decreased balance;Decreased mobility;Decreased knowledge of use of DME;Decreased safety awareness;Decreased knowledge of precautions;Pain       PT Treatment Interventions DME instruction;Gait training;Functional mobility training;Therapeutic activities;Therapeutic exercise;Balance training;Patient/family education    PT Goals (Current goals can be found in the Care Plan section)  Acute Rehab PT Goals Patient Stated Goal: Back to ALF PT Goal Formulation: With patient/family Time For Goal Achievement: 06/25/22 Potential to Achieve Goals: Good    Frequency Min 5X/week     Co-evaluation               AM-PAC PT "6 Clicks" Mobility  Outcome Measure Help needed turning from your back to your side while in a  flat bed without using bedrails?: A Little Help needed moving from lying on your back to sitting on the side of a flat bed without using bedrails?: A Little Help needed moving to and from a bed to a chair (including a wheelchair)?: A Little Help needed standing up from a chair using your arms (e.g., wheelchair or bedside chair)?: A Little Help needed to walk in hospital room?: A Little Help needed climbing 3-5 steps with a railing? : A Little 6 Click Score: 18    End of Session Equipment Utilized During Treatment: Gait belt;Back brace Activity Tolerance: Patient tolerated treatment well Patient left: in bed;with call bell/phone within reach;with family/visitor present Nurse Communication: Mobility status PT Visit Diagnosis: Unsteadiness on feet (R26.81);Pain Pain - part of body:  (back)    Time: 9629-5284 PT Time Calculation (min) (ACUTE ONLY): 23 min   Charges:   PT Evaluation $PT Eval Low Complexity: 1 Low PT Treatments $Gait Training: 8-22 mins        Rolinda Roan, PT, DPT Acute Rehabilitation Services Secure Chat Preferred Office: [phone]   Christine Higgins 06/18/2022, 12:55 PM

## 2022-06-19 ENCOUNTER — Emergency Department (HOSPITAL_COMMUNITY): Payer: Medicare HMO | Admitting: Anesthesiology

## 2022-06-19 ENCOUNTER — Inpatient Hospital Stay (HOSPITAL_COMMUNITY)
Admission: EM | Admit: 2022-06-19 | Discharge: 2022-06-24 | DRG: 908 | Disposition: A | Discharge status: Skilled Nursing Facility | Payer: Medicare HMO | Source: Other Acute Inpatient Hospital | Attending: Neurosurgery | Admitting: Neurosurgery

## 2022-06-19 ENCOUNTER — Encounter (HOSPITAL_COMMUNITY): Payer: Self-pay

## 2022-06-19 ENCOUNTER — Emergency Department
Admission: EM | Admit: 2022-06-19 | Discharge: 2022-06-19 | Disposition: A | Discharge status: Other Acute Inpatient Hospital | Payer: Medicare HMO | Attending: Student in an Organized Health Care Education/Training Program | Admitting: Student in an Organized Health Care Education/Training Program

## 2022-06-19 ENCOUNTER — Inpatient Hospital Stay (HOSPITAL_COMMUNITY)
Admission: EM | Disposition: A | Discharge status: Home / Self Care | Payer: Self-pay | Source: Home / Self Care | Attending: Neurosurgery

## 2022-06-19 ENCOUNTER — Other Ambulatory Visit: Payer: Self-pay

## 2022-06-19 DIAGNOSIS — E871 Hypo-osmolality and hyponatremia: Secondary | ICD-10-CM | POA: Diagnosis present

## 2022-06-19 DIAGNOSIS — R262 Difficulty in walking, not elsewhere classified: Secondary | ICD-10-CM | POA: Diagnosis present

## 2022-06-19 DIAGNOSIS — Y838 Other surgical procedures as the cause of abnormal reaction of the patient, or of later complication, without mention of misadventure at the time of the procedure: Secondary | ICD-10-CM | POA: Diagnosis present

## 2022-06-19 DIAGNOSIS — Z87891 Personal history of nicotine dependence: Secondary | ICD-10-CM

## 2022-06-19 DIAGNOSIS — M48062 Spinal stenosis, lumbar region with neurogenic claudication: Secondary | ICD-10-CM | POA: Diagnosis present

## 2022-06-19 DIAGNOSIS — H409 Unspecified glaucoma: Secondary | ICD-10-CM | POA: Diagnosis present

## 2022-06-19 DIAGNOSIS — Z7983 Long term (current) use of bisphosphonates: Secondary | ICD-10-CM

## 2022-06-19 DIAGNOSIS — M4726 Other spondylosis with radiculopathy, lumbar region: Secondary | ICD-10-CM | POA: Diagnosis present

## 2022-06-19 DIAGNOSIS — M549 Dorsalgia, unspecified: Secondary | ICD-10-CM

## 2022-06-19 DIAGNOSIS — G8918 Other acute postprocedural pain: Secondary | ICD-10-CM | POA: Insufficient documentation

## 2022-06-19 DIAGNOSIS — Z6841 Body Mass Index (BMI) 40.0 and over, adult: Secondary | ICD-10-CM | POA: Diagnosis not present

## 2022-06-19 DIAGNOSIS — Z881 Allergy status to other antibiotic agents status: Secondary | ICD-10-CM

## 2022-06-19 DIAGNOSIS — Z79899 Other long term (current) drug therapy: Secondary | ICD-10-CM

## 2022-06-19 DIAGNOSIS — Z8249 Family history of ischemic heart disease and other diseases of the circulatory system: Secondary | ICD-10-CM

## 2022-06-19 DIAGNOSIS — J45909 Unspecified asthma, uncomplicated: Secondary | ICD-10-CM

## 2022-06-19 DIAGNOSIS — M79604 Pain in right leg: Secondary | ICD-10-CM | POA: Diagnosis not present

## 2022-06-19 DIAGNOSIS — J4489 Other specified chronic obstructive pulmonary disease: Secondary | ICD-10-CM | POA: Diagnosis present

## 2022-06-19 DIAGNOSIS — E785 Hyperlipidemia, unspecified: Secondary | ICD-10-CM | POA: Diagnosis present

## 2022-06-19 DIAGNOSIS — M797 Fibromyalgia: Secondary | ICD-10-CM | POA: Diagnosis present

## 2022-06-19 DIAGNOSIS — M5116 Intervertebral disc disorders with radiculopathy, lumbar region: Secondary | ICD-10-CM | POA: Diagnosis present

## 2022-06-19 DIAGNOSIS — G894 Chronic pain syndrome: Secondary | ICD-10-CM | POA: Diagnosis present

## 2022-06-19 DIAGNOSIS — Z791 Long term (current) use of non-steroidal anti-inflammatories (NSAID): Secondary | ICD-10-CM

## 2022-06-19 DIAGNOSIS — F32A Depression, unspecified: Secondary | ICD-10-CM | POA: Diagnosis present

## 2022-06-19 DIAGNOSIS — Z9889 Other specified postprocedural states: Secondary | ICD-10-CM

## 2022-06-19 DIAGNOSIS — S064XAA Epidural hemorrhage with loss of consciousness status unknown, initial encounter: Secondary | ICD-10-CM | POA: Diagnosis present

## 2022-06-19 DIAGNOSIS — M79605 Pain in left leg: Secondary | ICD-10-CM | POA: Insufficient documentation

## 2022-06-19 DIAGNOSIS — E876 Hypokalemia: Secondary | ICD-10-CM | POA: Diagnosis present

## 2022-06-19 DIAGNOSIS — Z96653 Presence of artificial knee joint, bilateral: Secondary | ICD-10-CM | POA: Diagnosis present

## 2022-06-19 DIAGNOSIS — I10 Essential (primary) hypertension: Secondary | ICD-10-CM

## 2022-06-19 DIAGNOSIS — G9752 Postprocedural hemorrhage and hematoma of a nervous system organ or structure following other procedure: Principal | ICD-10-CM | POA: Diagnosis present

## 2022-06-19 DIAGNOSIS — R339 Retention of urine, unspecified: Secondary | ICD-10-CM | POA: Diagnosis present

## 2022-06-19 DIAGNOSIS — K219 Gastro-esophageal reflux disease without esophagitis: Secondary | ICD-10-CM | POA: Diagnosis present

## 2022-06-19 DIAGNOSIS — M545 Low back pain, unspecified: Secondary | ICD-10-CM | POA: Diagnosis present

## 2022-06-19 DIAGNOSIS — Z96641 Presence of right artificial hip joint: Secondary | ICD-10-CM | POA: Diagnosis present

## 2022-06-19 DIAGNOSIS — S300XXA Contusion of lower back and pelvis, initial encounter: Secondary | ICD-10-CM | POA: Diagnosis not present

## 2022-06-19 DIAGNOSIS — Z888 Allergy status to other drugs, medicaments and biological substances status: Secondary | ICD-10-CM

## 2022-06-19 DIAGNOSIS — D649 Anemia, unspecified: Secondary | ICD-10-CM | POA: Diagnosis present

## 2022-06-19 DIAGNOSIS — R32 Unspecified urinary incontinence: Secondary | ICD-10-CM | POA: Diagnosis present

## 2022-06-19 DIAGNOSIS — Z96612 Presence of left artificial shoulder joint: Secondary | ICD-10-CM | POA: Diagnosis present

## 2022-06-19 DIAGNOSIS — M4316 Spondylolisthesis, lumbar region: Secondary | ICD-10-CM | POA: Diagnosis present

## 2022-06-19 DIAGNOSIS — L4050 Arthropathic psoriasis, unspecified: Secondary | ICD-10-CM | POA: Diagnosis present

## 2022-06-19 DIAGNOSIS — Z886 Allergy status to analgesic agent status: Secondary | ICD-10-CM

## 2022-06-19 DIAGNOSIS — Z751 Person awaiting admission to adequate facility elsewhere: Secondary | ICD-10-CM

## 2022-06-19 DIAGNOSIS — Z7951 Long term (current) use of inhaled steroids: Secondary | ICD-10-CM

## 2022-06-19 HISTORY — PX: LUMBAR LAMINECTOMY/DECOMPRESSION MICRODISCECTOMY: SHX5026

## 2022-06-19 LAB — CBC WITH DIFFERENTIAL/PLATELET
Abs Immature Granulocytes: 0.05 10*3/uL (ref 0.00–0.07)
Basophils Absolute: 0 10*3/uL (ref 0.0–0.1)
Basophils Relative: 0 %
Eosinophils Absolute: 0 10*3/uL (ref 0.0–0.5)
Eosinophils Relative: 0 %
HCT: 31.4 % — ABNORMAL LOW (ref 36.0–46.0)
Hemoglobin: 10.1 g/dL — ABNORMAL LOW (ref 12.0–15.0)
Immature Granulocytes: 1 %
Lymphocytes Relative: 10 %
Lymphs Abs: 0.9 10*3/uL (ref 0.7–4.0)
MCH: 27 pg (ref 26.0–34.0)
MCHC: 32.2 g/dL (ref 30.0–36.0)
MCV: 84 fL (ref 80.0–100.0)
Monocytes Absolute: 1.3 10*3/uL — ABNORMAL HIGH (ref 0.1–1.0)
Monocytes Relative: 15 %
Neutro Abs: 6.1 10*3/uL (ref 1.7–7.7)
Neutrophils Relative %: 74 %
Platelets: 129 10*3/uL — ABNORMAL LOW (ref 150–400)
RBC: 3.74 MIL/uL — ABNORMAL LOW (ref 3.87–5.11)
RDW: 13.8 % (ref 11.5–15.5)
WBC: 8.3 10*3/uL (ref 4.0–10.5)
nRBC: 0 % (ref 0.0–0.2)

## 2022-06-19 LAB — BASIC METABOLIC PANEL
Anion gap: 10 (ref 5–15)
BUN: 10 mg/dL (ref 8–23)
CO2: 22 mmol/L (ref 22–32)
Calcium: 8.9 mg/dL (ref 8.9–10.3)
Chloride: 101 mmol/L (ref 98–111)
Creatinine, Ser: 0.67 mg/dL (ref 0.44–1.00)
GFR, Estimated: >60 mL/min (ref >60)
Glucose, Bld: 129 mg/dL — ABNORMAL HIGH (ref 70–99)
Potassium: 3 mmol/L — ABNORMAL LOW (ref 3.5–5.1)
Sodium: 133 mmol/L — ABNORMAL LOW (ref 135–145)

## 2022-06-19 LAB — URINALYSIS, ROUTINE W REFLEX MICROSCOPIC
Bilirubin Urine: NEGATIVE
Glucose, UA: NEGATIVE mg/dL
Hgb urine dipstick: NEGATIVE
Ketones, ur: 20 mg/dL — AB
Leukocytes,Ua: NEGATIVE
Nitrite: NEGATIVE
Protein, ur: NEGATIVE mg/dL
Specific Gravity, Urine: 1.008 (ref 1.005–1.030)
pH: 5 (ref 5.0–8.0)

## 2022-06-19 LAB — GLUCOSE, CAPILLARY: Glucose-Capillary: 121 mg/dL — ABNORMAL HIGH (ref 70–99)

## 2022-06-19 SURGERY — LUMBAR LAMINECTOMY/DECOMPRESSION MICRODISCECTOMY 1 LEVEL
Anesthesia: General

## 2022-06-19 MED ORDER — MOMETASONE FURO-FORMOTEROL FUM 200-5 MCG/ACT IN AERO
2.0000 | INHALATION_SPRAY | Freq: Two times a day (BID) | RESPIRATORY_TRACT | Status: DC
  Administered 2022-06-20 – 2022-06-24 (×10): 2 via RESPIRATORY_TRACT
  Filled 2022-06-19: qty 8.8

## 2022-06-19 MED ORDER — TAMSULOSIN HCL 0.4 MG PO CAPS
0.4000 mg | ORAL_CAPSULE | Freq: Every day | ORAL | Status: DC
  Administered 2022-06-20 – 2022-06-24 (×5): 0.4 mg via ORAL
  Filled 2022-06-19 (×5): qty 1

## 2022-06-19 MED ORDER — CYCLOSPORINE 0.05 % OP EMUL
1.0000 [drp] | Freq: Two times a day (BID) | OPHTHALMIC | Status: DC
  Administered 2022-06-20 – 2022-06-24 (×10): 1 [drp] via OPHTHALMIC
  Filled 2022-06-19 (×11): qty 30

## 2022-06-19 MED ORDER — ROCURONIUM BROMIDE 10 MG/ML (PF) SYRINGE
PREFILLED_SYRINGE | INTRAVENOUS | Status: DC | PRN
  Administered 2022-06-19: 50 mg via INTRAVENOUS

## 2022-06-19 MED ORDER — ACETAMINOPHEN 500 MG PO TABS
1000.0000 mg | ORAL_TABLET | Freq: Four times a day (QID) | ORAL | Status: DC | PRN
  Administered 2022-06-23 (×2): 1000 mg via ORAL
  Filled 2022-06-19 (×2): qty 2

## 2022-06-19 MED ORDER — VANCOMYCIN HCL IN DEXTROSE 1-5 GM/200ML-% IV SOLN
INTRAVENOUS | Status: AC
  Filled 2022-06-19: qty 200

## 2022-06-19 MED ORDER — GUAIFENESIN 100 MG/5ML PO LIQD: 15.0000 mL | Freq: Four times a day (QID) | ORAL | Status: DC | PRN

## 2022-06-19 MED ORDER — POTASSIUM CHLORIDE ER 10 MEQ PO TBCR
10.0000 meq | EXTENDED_RELEASE_TABLET | Freq: Every day | ORAL | Status: DC
  Administered 2022-06-20 – 2022-06-24 (×5): 10 meq via ORAL
  Filled 2022-06-19 (×10): qty 1

## 2022-06-19 MED ORDER — ALUM & MAG HYDROXIDE-SIMETH 200-200-20 MG/5ML PO SUSP
30.0000 mL | Freq: Four times a day (QID) | ORAL | Status: DC | PRN
  Administered 2022-06-23: 30 mL via ORAL
  Filled 2022-06-19: qty 30

## 2022-06-19 MED ORDER — HYDROMORPHONE HCL 1 MG/ML IJ SOLN
INTRAMUSCULAR | Status: AC
  Filled 2022-06-19: qty 1

## 2022-06-19 MED ORDER — ORAL CARE MOUTH RINSE: 15.0000 mL | Freq: Once | OROMUCOSAL | Status: AC

## 2022-06-19 MED ORDER — DEXAMETHASONE SODIUM PHOSPHATE 10 MG/ML IJ SOLN
INTRAMUSCULAR | Status: AC
  Filled 2022-06-19: qty 1

## 2022-06-19 MED ORDER — THROMBIN 5000 UNITS EX SOLR
CUTANEOUS | Status: AC
  Filled 2022-06-19: qty 15000

## 2022-06-19 MED ORDER — METHOCARBAMOL 750 MG PO TABS
750.0000 mg | ORAL_TABLET | Freq: Three times a day (TID) | ORAL | Status: DC | PRN
  Administered 2022-06-20 – 2022-06-21 (×3): 750 mg via ORAL
  Filled 2022-06-19 (×3): qty 1

## 2022-06-19 MED ORDER — GABAPENTIN 600 MG PO TABS
600.0000 mg | ORAL_TABLET | Freq: Three times a day (TID) | ORAL | Status: DC
  Administered 2022-06-20 – 2022-06-24 (×15): 600 mg via ORAL
  Filled 2022-06-19 (×15): qty 1

## 2022-06-19 MED ORDER — PROPOFOL 10 MG/ML IV BOLUS
INTRAVENOUS | Status: AC
  Filled 2022-06-19: qty 20

## 2022-06-19 MED ORDER — DULOXETINE HCL 60 MG PO CPEP
60.0000 mg | ORAL_CAPSULE | Freq: Every day | ORAL | Status: DC
  Administered 2022-06-20 – 2022-06-24 (×5): 60 mg via ORAL
  Filled 2022-06-19 (×5): qty 1

## 2022-06-19 MED ORDER — DIPHENHYDRAMINE HCL 25 MG PO CAPS: 25.0000 mg | ORAL_CAPSULE | Freq: Four times a day (QID) | ORAL | Status: DC | PRN

## 2022-06-19 MED ORDER — LOPERAMIDE HCL 2 MG PO CAPS: 4.0000 mg | ORAL_CAPSULE | Freq: Four times a day (QID) | ORAL | Status: DC | PRN

## 2022-06-19 MED ORDER — SUCCINYLCHOLINE CHLORIDE 200 MG/10ML IV SOSY
PREFILLED_SYRINGE | INTRAVENOUS | Status: DC | PRN
  Administered 2022-06-19: 120 mg via INTRAVENOUS

## 2022-06-19 MED ORDER — OXYCODONE HCL 5 MG PO TABS: 5.0000 mg | ORAL_TABLET | ORAL | Status: DC | PRN

## 2022-06-19 MED ORDER — ARTIFICIAL TEARS OPHTHALMIC OINT
TOPICAL_OINTMENT | OPHTHALMIC | Status: AC
  Filled 2022-06-19: qty 3.5

## 2022-06-19 MED ORDER — FENTANYL CITRATE (PF) 100 MCG/2ML IJ SOLN
INTRAMUSCULAR | Status: DC | PRN
  Administered 2022-06-19: 50 ug via INTRAVENOUS

## 2022-06-19 MED ORDER — SODIUM CHLORIDE 0.9 % IV SOLN: 250.0000 mL | INTRAVENOUS | Status: DC

## 2022-06-19 MED ORDER — VANCOMYCIN HCL 1500 MG/300ML IV SOLN
1500.0000 mg | Freq: Once | INTRAVENOUS | Status: AC
  Administered 2022-06-20: 1500 mg via INTRAVENOUS
  Filled 2022-06-19: qty 300

## 2022-06-19 MED ORDER — ZOLPIDEM TARTRATE 5 MG PO TABS: 5.0000 mg | ORAL_TABLET | Freq: Every evening | ORAL | Status: DC | PRN

## 2022-06-19 MED ORDER — ONDANSETRON HCL 4 MG/2ML IJ SOLN
4.0000 mg | Freq: Once | INTRAMUSCULAR | Status: AC
  Administered 2022-06-19: 4 mg via INTRAVENOUS
  Filled 2022-06-19: qty 2

## 2022-06-19 MED ORDER — SUGAMMADEX SODIUM 200 MG/2ML IV SOLN
INTRAVENOUS | Status: DC | PRN
  Administered 2022-06-19: 200 mg via INTRAVENOUS

## 2022-06-19 MED ORDER — BACITRACIN ZINC 500 UNIT/GM EX OINT
TOPICAL_OINTMENT | CUTANEOUS | Status: DC | PRN
  Administered 2022-06-19: 1 via TOPICAL

## 2022-06-19 MED ORDER — 0.9 % SODIUM CHLORIDE (POUR BTL) OPTIME
TOPICAL | Status: DC | PRN
  Administered 2022-06-19: 1000 mL

## 2022-06-19 MED ORDER — PROPRANOLOL HCL 80 MG PO TABS
80.0000 mg | ORAL_TABLET | Freq: Two times a day (BID) | ORAL | Status: DC
  Administered 2022-06-20 – 2022-06-24 (×10): 80 mg via ORAL
  Filled 2022-06-19 (×11): qty 1

## 2022-06-19 MED ORDER — VITAMIN B-12 1000 MCG PO TABS
5000.0000 ug | ORAL_TABLET | ORAL | Status: DC
  Administered 2022-06-20 – 2022-06-24 (×3): 5000 ug via ORAL
  Filled 2022-06-19 (×4): qty 5

## 2022-06-19 MED ORDER — MAGNESIUM HYDROXIDE 400 MG/5ML PO SUSP: 30.0000 mL | Freq: Every day | ORAL | Status: DC | PRN

## 2022-06-19 MED ORDER — MORPHINE SULFATE (PF) 2 MG/ML IV SOLN
4.0000 mg | INTRAVENOUS | Status: DC | PRN
  Administered 2022-06-21: 4 mg via INTRAVENOUS
  Filled 2022-06-19: qty 2

## 2022-06-19 MED ORDER — OXYCODONE HCL 5 MG PO TABS
10.0000 mg | ORAL_TABLET | ORAL | Status: DC | PRN
  Administered 2022-06-20 – 2022-06-24 (×20): 10 mg via ORAL
  Filled 2022-06-19 (×20): qty 2

## 2022-06-19 MED ORDER — MIDAZOLAM HCL 2 MG/2ML IJ SOLN
INTRAMUSCULAR | Status: DC | PRN
  Administered 2022-06-19: 2 mg via INTRAVENOUS

## 2022-06-19 MED ORDER — TORSEMIDE 20 MG PO TABS
10.0000 mg | ORAL_TABLET | Freq: Every day | ORAL | Status: DC
  Administered 2022-06-20 – 2022-06-24 (×5): 10 mg via ORAL
  Filled 2022-06-19 (×5): qty 1

## 2022-06-19 MED ORDER — ACETAMINOPHEN 325 MG PO TABS: 650.0000 mg | ORAL_TABLET | ORAL | Status: DC | PRN

## 2022-06-19 MED ORDER — LIDOCAINE 2% (20 MG/ML) 5 ML SYRINGE
INTRAMUSCULAR | Status: AC
  Filled 2022-06-19: qty 5

## 2022-06-19 MED ORDER — LINACLOTIDE 145 MCG PO CAPS
290.0000 ug | ORAL_CAPSULE | Freq: Every day | ORAL | Status: DC
  Administered 2022-06-20 – 2022-06-24 (×5): 290 ug via ORAL
  Filled 2022-06-19 (×5): qty 2

## 2022-06-19 MED ORDER — TRAMADOL HCL 50 MG PO TABS: 50.0000 mg | ORAL_TABLET | Freq: Four times a day (QID) | ORAL | Status: DC | PRN

## 2022-06-19 MED ORDER — IBANDRONATE SODIUM 150 MG PO TABS: 150.0000 mg | ORAL_TABLET | ORAL | Status: DC

## 2022-06-19 MED ORDER — MORPHINE SULFATE (PF) 4 MG/ML IV SOLN
4.0000 mg | INTRAVENOUS | Status: DC | PRN
  Administered 2022-06-19: 4 mg via INTRAVENOUS
  Filled 2022-06-19: qty 1

## 2022-06-19 MED ORDER — ALBUTEROL SULFATE (2.5 MG/3ML) 0.083% IN NEBU: 3.0000 mL | INHALATION_SOLUTION | RESPIRATORY_TRACT | Status: DC | PRN

## 2022-06-19 MED ORDER — DIPHENHYDRAMINE-ZINC ACETATE 2-0.1 % EX CREA: 1.0000 | TOPICAL_CREAM | Freq: Three times a day (TID) | CUTANEOUS | Status: DC | PRN

## 2022-06-19 MED ORDER — ACETAMINOPHEN 500 MG PO TABS
1000.0000 mg | ORAL_TABLET | Freq: Four times a day (QID) | ORAL | Status: AC
  Administered 2022-06-20 (×4): 1000 mg via ORAL
  Filled 2022-06-19 (×4): qty 2

## 2022-06-19 MED ORDER — CETIRIZINE HCL 10 MG PO TABS
10.0000 mg | ORAL_TABLET | Freq: Every evening | ORAL | Status: DC
  Administered 2022-06-20 – 2022-06-24 (×5): 10 mg via ORAL
  Filled 2022-06-19 (×5): qty 1

## 2022-06-19 MED ORDER — CHLORHEXIDINE GLUCONATE 0.12 % MT SOLN
OROMUCOSAL | Status: AC
  Administered 2022-06-19: 15 mL via OROMUCOSAL
  Filled 2022-06-19: qty 15

## 2022-06-19 MED ORDER — DULOXETINE HCL 30 MG PO CPEP
30.0000 mg | ORAL_CAPSULE | Freq: Every day | ORAL | Status: DC
  Administered 2022-06-20 – 2022-06-23 (×5): 30 mg via ORAL
  Filled 2022-06-19 (×5): qty 1

## 2022-06-19 MED ORDER — PHENOL 1.4 % MT LIQD: 1.0000 | OROMUCOSAL | Status: DC | PRN

## 2022-06-19 MED ORDER — CYCLOBENZAPRINE HCL 10 MG PO TABS
10.0000 mg | ORAL_TABLET | Freq: Three times a day (TID) | ORAL | Status: DC | PRN
  Administered 2022-06-21 – 2022-06-23 (×7): 10 mg via ORAL
  Filled 2022-06-19 (×7): qty 1

## 2022-06-19 MED ORDER — ACETAMINOPHEN ER 650 MG PO TBCR: 1300.0000 mg | EXTENDED_RELEASE_TABLET | Freq: Two times a day (BID) | ORAL | Status: DC

## 2022-06-19 MED ORDER — LACTATED RINGERS IV SOLN: INTRAVENOUS | Status: DC

## 2022-06-19 MED ORDER — VANCOMYCIN HCL 1000 MG IV SOLR
INTRAVENOUS | Status: DC | PRN
  Administered 2022-06-19: 1000 mg via INTRAVENOUS

## 2022-06-19 MED ORDER — FENTANYL CITRATE (PF) 250 MCG/5ML IJ SOLN
INTRAMUSCULAR | Status: AC
  Filled 2022-06-19: qty 5

## 2022-06-19 MED ORDER — ATORVASTATIN CALCIUM 10 MG PO TABS
10.0000 mg | ORAL_TABLET | Freq: Every day | ORAL | Status: DC
  Administered 2022-06-20 – 2022-06-24 (×5): 10 mg via ORAL
  Filled 2022-06-19 (×5): qty 1

## 2022-06-19 MED ORDER — DOCUSATE SODIUM 100 MG PO CAPS
100.0000 mg | ORAL_CAPSULE | Freq: Two times a day (BID) | ORAL | Status: DC
  Administered 2022-06-20 – 2022-06-24 (×10): 100 mg via ORAL
  Filled 2022-06-19 (×10): qty 1

## 2022-06-19 MED ORDER — VITAMIN E 180 MG (400 UNIT) PO CAPS
400.0000 [IU] | ORAL_CAPSULE | Freq: Every day | ORAL | Status: DC
  Administered 2022-06-20 – 2022-06-24 (×5): 400 [IU] via ORAL
  Filled 2022-06-19 (×5): qty 1

## 2022-06-19 MED ORDER — DEXAMETHASONE SODIUM PHOSPHATE 10 MG/ML IJ SOLN
INTRAMUSCULAR | Status: DC | PRN
  Administered 2022-06-19: 10 mg via INTRAVENOUS

## 2022-06-19 MED ORDER — FENTANYL CITRATE (PF) 100 MCG/2ML IJ SOLN
INTRAMUSCULAR | Status: AC
  Filled 2022-06-19: qty 2

## 2022-06-19 MED ORDER — SODIUM CHLORIDE 0.9% FLUSH: 3.0000 mL | INTRAVENOUS | Status: DC | PRN

## 2022-06-19 MED ORDER — SUCRALFATE 1 G PO TABS
1.0000 g | ORAL_TABLET | Freq: Three times a day (TID) | ORAL | Status: DC
  Administered 2022-06-20 – 2022-06-24 (×19): 1 g via ORAL
  Filled 2022-06-19 (×19): qty 1

## 2022-06-19 MED ORDER — FENTANYL CITRATE (PF) 100 MCG/2ML IJ SOLN
25.0000 ug | INTRAMUSCULAR | Status: DC | PRN
  Administered 2022-06-19 (×2): 50 ug via INTRAVENOUS

## 2022-06-19 MED ORDER — FLUTICASONE PROPIONATE 50 MCG/ACT NA SUSP
2.0000 | Freq: Every day | NASAL | Status: DC
  Administered 2022-06-20 – 2022-06-24 (×5): 2 via NASAL
  Filled 2022-06-19: qty 16

## 2022-06-19 MED ORDER — PHENYLEPHRINE 80 MCG/ML (10ML) SYRINGE FOR IV PUSH (FOR BLOOD PRESSURE SUPPORT)
PREFILLED_SYRINGE | INTRAVENOUS | Status: DC | PRN
  Administered 2022-06-19 (×4): 160 ug via INTRAVENOUS

## 2022-06-19 MED ORDER — LIDOCAINE 2% (20 MG/ML) 5 ML SYRINGE
INTRAMUSCULAR | Status: DC | PRN
  Administered 2022-06-19: 80 mg via INTRAVENOUS

## 2022-06-19 MED ORDER — PROPOFOL 10 MG/ML IV BOLUS
INTRAVENOUS | Status: DC | PRN
  Administered 2022-06-19: 140 mg via INTRAVENOUS

## 2022-06-19 MED ORDER — DOXYCYCLINE HYCLATE 100 MG PO TABS
100.0000 mg | ORAL_TABLET | Freq: Two times a day (BID) | ORAL | Status: DC
  Administered 2022-06-20 – 2022-06-24 (×10): 100 mg via ORAL
  Filled 2022-06-19 (×10): qty 1

## 2022-06-19 MED ORDER — BUPIVACAINE-EPINEPHRINE (PF) 0.5% -1:200000 IJ SOLN
INTRAMUSCULAR | Status: AC
  Filled 2022-06-19: qty 30

## 2022-06-19 MED ORDER — VITAMIN D 25 MCG (1000 UNIT) PO TABS
3000.0000 [IU] | ORAL_TABLET | Freq: Every day | ORAL | Status: DC
  Administered 2022-06-20 – 2022-06-24 (×5): 3000 [IU] via ORAL
  Filled 2022-06-19 (×5): qty 3

## 2022-06-19 MED ORDER — LOSARTAN POTASSIUM 50 MG PO TABS
100.0000 mg | ORAL_TABLET | Freq: Every day | ORAL | Status: DC
  Administered 2022-06-20 – 2022-06-24 (×5): 100 mg via ORAL
  Filled 2022-06-19 (×5): qty 2

## 2022-06-19 MED ORDER — PHENYLEPHRINE 80 MCG/ML (10ML) SYRINGE FOR IV PUSH (FOR BLOOD PRESSURE SUPPORT)
PREFILLED_SYRINGE | INTRAVENOUS | Status: AC
  Filled 2022-06-19: qty 10

## 2022-06-19 MED ORDER — ACETAMINOPHEN 10 MG/ML IV SOLN: 1000.0000 mg | Freq: Once | INTRAVENOUS | Status: DC | PRN

## 2022-06-19 MED ORDER — BISACODYL 10 MG RE SUPP: 10.0000 mg | Freq: Every day | RECTAL | Status: DC | PRN

## 2022-06-19 MED ORDER — THROMBIN (RECOMBINANT) 5000 UNITS EX SOLR
CUTANEOUS | Status: DC | PRN
  Administered 2022-06-19: 1 via TOPICAL

## 2022-06-19 MED ORDER — MOMETASONE FURO-FORMOTEROL FUM 200-5 MCG/ACT IN AERO
2.0000 | INHALATION_SPRAY | Freq: Two times a day (BID) | RESPIRATORY_TRACT | Status: DC
  Filled 2022-06-19: qty 8.8

## 2022-06-19 MED ORDER — DOCUSATE SODIUM 100 MG PO CAPS: 100.0000 mg | ORAL_CAPSULE | Freq: Two times a day (BID) | ORAL | Status: DC

## 2022-06-19 MED ORDER — SODIUM CHLORIDE 0.9% FLUSH
3.0000 mL | Freq: Two times a day (BID) | INTRAVENOUS | Status: DC
  Administered 2022-06-20 – 2022-06-24 (×10): 3 mL via INTRAVENOUS

## 2022-06-19 MED ORDER — ONDANSETRON HCL 4 MG/2ML IJ SOLN
INTRAMUSCULAR | Status: AC
  Filled 2022-06-19: qty 2

## 2022-06-19 MED ORDER — HYDROMORPHONE HCL 1 MG/ML IJ SOLN
0.5000 mg | Freq: Once | INTRAMUSCULAR | Status: AC
  Administered 2022-06-19: 0.5 mg via INTRAVENOUS

## 2022-06-19 MED ORDER — ONDANSETRON HCL 4 MG/2ML IJ SOLN
INTRAMUSCULAR | Status: DC | PRN
  Administered 2022-06-19: 4 mg via INTRAVENOUS

## 2022-06-19 MED ORDER — CALCIUM CARBONATE 1250 (500 CA) MG PO TABS
600.0000 mg | ORAL_TABLET | ORAL | Status: DC
  Administered 2022-06-20 – 2022-06-24 (×3): 625 mg via ORAL
  Filled 2022-06-19 (×4): qty 1

## 2022-06-19 MED ORDER — BACITRACIN ZINC 500 UNIT/GM EX OINT
TOPICAL_OINTMENT | CUTANEOUS | Status: AC
  Filled 2022-06-19: qty 28.35

## 2022-06-19 MED ORDER — PANTOPRAZOLE SODIUM 40 MG PO TBEC
80.0000 mg | DELAYED_RELEASE_TABLET | Freq: Every day | ORAL | Status: DC
  Administered 2022-06-20 – 2022-06-24 (×5): 80 mg via ORAL
  Filled 2022-06-19 (×4): qty 2

## 2022-06-19 MED ORDER — DIPHENHYDRAMINE HCL 25 MG PO CAPS: 25.0000 mg | ORAL_CAPSULE | Freq: Every evening | ORAL | Status: DC | PRN

## 2022-06-19 MED ORDER — MENTHOL 3 MG MT LOZG: 1.0000 | LOZENGE | OROMUCOSAL | Status: DC | PRN

## 2022-06-19 MED ORDER — METHIMAZOLE 5 MG PO TABS
5.0000 mg | ORAL_TABLET | Freq: Every day | ORAL | Status: DC
  Administered 2022-06-20 – 2022-06-24 (×5): 5 mg via ORAL
  Filled 2022-06-19 (×5): qty 1

## 2022-06-19 MED ORDER — MIDAZOLAM HCL 2 MG/2ML IJ SOLN
INTRAMUSCULAR | Status: AC
  Filled 2022-06-19: qty 2

## 2022-06-19 MED ORDER — ONDANSETRON HCL 4 MG PO TABS: 4.0000 mg | ORAL_TABLET | Freq: Four times a day (QID) | ORAL | Status: DC | PRN

## 2022-06-19 MED ORDER — OXYCODONE-ACETAMINOPHEN 5-325 MG PO TABS: 1.0000 | ORAL_TABLET | ORAL | Status: DC | PRN

## 2022-06-19 MED ORDER — CLONAZEPAM 0.5 MG PO TABS: 0.5000 mg | ORAL_TABLET | Freq: Two times a day (BID) | ORAL | Status: DC | PRN

## 2022-06-19 MED ORDER — THROMBIN 5000 UNITS EX SOLR
OROMUCOSAL | Status: DC | PRN
  Administered 2022-06-19: 5 mL via TOPICAL

## 2022-06-19 MED ORDER — TRAZODONE HCL 50 MG PO TABS
75.0000 mg | ORAL_TABLET | Freq: Every evening | ORAL | Status: DC | PRN
  Administered 2022-06-20 – 2022-06-23 (×5): 75 mg via ORAL
  Filled 2022-06-19 (×5): qty 2

## 2022-06-19 MED ORDER — CHLORHEXIDINE GLUCONATE 0.12 % MT SOLN: 15.0000 mL | Freq: Once | OROMUCOSAL | Status: AC

## 2022-06-19 MED ORDER — ACETAMINOPHEN 650 MG RE SUPP: 650.0000 mg | RECTAL | Status: DC | PRN

## 2022-06-19 MED ORDER — ONDANSETRON HCL 4 MG/2ML IJ SOLN: 4.0000 mg | Freq: Four times a day (QID) | INTRAMUSCULAR | Status: DC | PRN

## 2022-06-19 MED ORDER — HYDROXYZINE HCL 10 MG PO TABS: 10.0000 mg | ORAL_TABLET | Freq: Every evening | ORAL | Status: DC | PRN

## 2022-06-19 MED ORDER — ADULT MULTIVITAMIN W/MINERALS CH
1.0000 | ORAL_TABLET | Freq: Every day | ORAL | Status: DC
  Administered 2022-06-20 – 2022-06-24 (×5): 1 via ORAL
  Filled 2022-06-19 (×5): qty 1

## 2022-06-19 SURGICAL SUPPLY — 46 items
APL SKNCLS STERI-STRIP NONHPOA (GAUZE/BANDAGES/DRESSINGS) ×1
BAG COUNTER SPONGE SURGICOUNT (BAG) ×2 IMPLANT
BAG SPNG CNTER NS LX DISP (BAG) ×1
BAND INSRT 18 STRL LF DISP RB (MISCELLANEOUS) ×2
BAND RUBBER #18 3X1/16 STRL (MISCELLANEOUS) ×4 IMPLANT
BENZOIN TINCTURE PRP APPL 2/3 (GAUZE/BANDAGES/DRESSINGS) ×2 IMPLANT
BLADE CLIPPER SURG (BLADE) IMPLANT
BUR MATCHSTICK NEURO 3.0 LAGG (BURR) ×2 IMPLANT
BUR PRECISION FLUTE 6.0 (BURR) ×2 IMPLANT
CANISTER SUCT 3000ML PPV (MISCELLANEOUS) ×2 IMPLANT
DRAPE LAPAROTOMY 100X72X124 (DRAPES) ×2 IMPLANT
DRAPE MICROSCOPE SLANT 54X150 (MISCELLANEOUS) ×2 IMPLANT
DRAPE SURG 17X23 STRL (DRAPES) ×8 IMPLANT
DRSG OPSITE POSTOP 4X8 (GAUZE/BANDAGES/DRESSINGS) IMPLANT
ELECT BLADE 4.0 EZ CLEAN MEGAD (MISCELLANEOUS) ×1
ELECT REM PT RETURN 9FT ADLT (ELECTROSURGICAL) ×1
ELECTRODE BLDE 4.0 EZ CLN MEGD (MISCELLANEOUS) ×2 IMPLANT
ELECTRODE REM PT RTRN 9FT ADLT (ELECTROSURGICAL) ×2 IMPLANT
GAUZE 4X4 16PLY ~~LOC~~+RFID DBL (SPONGE) IMPLANT
GAUZE SPONGE 4X4 12PLY STRL (GAUZE/BANDAGES/DRESSINGS) ×2 IMPLANT
GLOVE BIO SURGEON STRL SZ8 (GLOVE) ×2 IMPLANT
GLOVE BIO SURGEON STRL SZ8.5 (GLOVE) ×2 IMPLANT
GLOVE EXAM NITRILE XL STR (GLOVE) IMPLANT
GOWN STRL REUS W/ TWL LRG LVL3 (GOWN DISPOSABLE) IMPLANT
GOWN STRL REUS W/ TWL XL LVL3 (GOWN DISPOSABLE) ×2 IMPLANT
GOWN STRL REUS W/TWL 2XL LVL3 (GOWN DISPOSABLE) IMPLANT
GOWN STRL REUS W/TWL LRG LVL3 (GOWN DISPOSABLE)
GOWN STRL REUS W/TWL XL LVL3 (GOWN DISPOSABLE) ×1
KIT BASIN OR (CUSTOM PROCEDURE TRAY) ×2 IMPLANT
KIT TURNOVER KIT B (KITS) ×2 IMPLANT
NDL HYPO 21X1.5 SAFETY (NEEDLE) IMPLANT
NEEDLE HYPO 21X1.5 SAFETY (NEEDLE) IMPLANT
NEEDLE HYPO 22GX1.5 SAFETY (NEEDLE) ×2 IMPLANT
NS IRRIG 1000ML POUR BTL (IV SOLUTION) ×2 IMPLANT
PACK LAMINECTOMY NEURO (CUSTOM PROCEDURE TRAY) ×2 IMPLANT
PAD ARMBOARD 7.5X6 YLW CONV (MISCELLANEOUS) ×6 IMPLANT
PATTIES SURGICAL .5 X1 (DISPOSABLE) IMPLANT
SPONGE SURGIFOAM ABS GEL SZ50 (HEMOSTASIS) ×2 IMPLANT
STRIP CLOSURE SKIN 1/2X4 (GAUZE/BANDAGES/DRESSINGS) ×2 IMPLANT
SUT ETHILON 2 0 PSLX (SUTURE) IMPLANT
SUT VIC AB 1 CT1 18XBRD ANBCTR (SUTURE) ×2 IMPLANT
SUT VIC AB 1 CT1 8-18 (SUTURE) ×1
SUT VIC AB 2-0 CP2 18 (SUTURE) ×2 IMPLANT
TOWEL GREEN STERILE (TOWEL DISPOSABLE) ×2 IMPLANT
TOWEL GREEN STERILE FF (TOWEL DISPOSABLE) ×2 IMPLANT
WATER STERILE IRR 1000ML POUR (IV SOLUTION) ×2 IMPLANT

## 2022-06-19 NOTE — ED Notes (Deleted)
[facility] accepted [facility]

## 2022-06-19 NOTE — ED Notes (Signed)
Dr. Quentin Cornwall notified that pt. Has not yet urinated.

## 2022-06-19 NOTE — Transfer of Care (Signed)
Immediate Anesthesia Transfer of Care Note  Patient: Christine Higgins  Procedure(s) Performed: EXPLORE LUMBAR WOUND FOR EVACUATION OF HEMATOMA  Patient Location: PACU  Anesthesia Type:General  Level of Consciousness: awake and oriented  Airway & Oxygen Therapy: Patient Spontanous Breathing  Post-op Assessment: Report given to RN  Post vital signs: Reviewed and stable  Last Vitals:  Vitals Value Taken Time  BP 149/81 06/19/22 2151  Temp    Pulse 90 06/19/22 2153  Resp 16 06/19/22 2152  SpO2 92 % 06/19/22 2153  Vitals shown include unvalidated device data.  Last Pain:  Vitals:   06/19/22 1926  TempSrc:   PainSc: 8          Complications:  Encounter Notable Events  Notable Event Outcome Phase Comment  Difficult to intubate - expected  Intraprocedure Filed from anesthesia note documentation.

## 2022-06-19 NOTE — ED Provider Notes (Signed)
[facility] EMERGENCY DEPARTMENT Provider Note   CSN: [id number] Arrival date & time: 06/19/22  1425     History  Chief Complaint  Patient presents with   Post-op Problem    Christine Higgins is a 66 y.o. female.  HPI 66 year old female presents today on transfer from [facility].  Patient had back surgery on Monday and was discharged yesterday.  She presented to the [facility] ED complaining of leg weakness and Neri incontinence.  On their exam there patient had saddle anesthesia, urinary retention with 1 L in bladder complaining of lower extremity weakness and pain.  Per Dr. Sharlet Salina note patient had decreased light touch in the left saddle area with drink increased rectal tone which was not flaccid but unable to bear down on exam he noted that she was moving extremities spontaneously no grossly focal neurologic deficit was noted.  Labs obtained there showing mild anemia with hemoglobin of 10, hyponatremia sodium 133 and hypokalemia with potassium of 3.0.  Care was discussed with Dr. Arnoldo Morale and patient was transferred to the ED at [facility] for neurosurgical evaluation     Home Medications Prior to Admission medications   Medication Sig Start Date End Date Taking? Authorizing Provider  albuterol (VENTOLIN HFA) 108 (90 Base) MCG/ACT inhaler Inhale 2 puffs into the lungs every 4 (four) hours as needed for shortness of breath or wheezing. 01/24/22   [provider]  alum & mag hydroxide-simeth (ANTACID LIQUID) 200-200-20 MG/5ML suspension Take 30 mLs by mouth 4 (four) times daily as needed for indigestion or heartburn. Notify provider if symptoms persist for more than 3 days    [provider]  atorvastatin (LIPITOR) 10 MG tablet Take 10 mg by mouth daily after supper.     [provider]  budesonide-formoterol (SYMBICORT) 160-4.5 MCG/ACT inhaler Inhale 2 puffs into the lungs 2 (two) times daily.    [provider]  calcium  carbonate (OS-CAL) 600 MG TABS tablet Take 600 mg by mouth every other day.    [provider]  celecoxib (CELEBREX) 200 MG capsule Take 200 mg by mouth 2 (two) times daily.    [provider]  Cholecalciferol (VITAMIN D3 SUPER STRENGTH) 50 MCG (2000 UT) TABS Take 3,000 Units by mouth daily.    [provider]  clonazePAM (KLONOPIN) 0.5 MG tablet Take 0.5 mg by mouth at bedtime as needed for anxiety. 05/21/22   [provider]  [employer] Extract (NEUTROGENA T/GEL EX) Apply 1 application  topically daily as needed (dandruff).    [provider]  Cyanocobalamin (B-12) 2500 MCG TABS Take 5,000 mcg by mouth every other day.    [provider]  diphenhydrAMINE (BENADRYL) 25 mg capsule Take 25 mg by mouth 4 (four) times daily as needed for itching or allergies.    [provider]  diphenhydrAMINE (BENADRYL) 25 MG tablet Take 25 mg by mouth at bedtime as needed for sleep or itching.    [provider]  diphenhydrAMINE-zinc acetate (BENADRYL ITCH STOPPING) cream Apply 1 application  topically 3 (three) times daily as needed for itching.    [provider]  docusate sodium (COLACE) 100 MG capsule Take 1 capsule (100 mg total) by mouth 2 (two) times daily. 06/18/22   Newman Pies, MD  doxycycline (VIBRA-TABS) 100 MG tablet Take 1 tablet (100 mg total) by mouth every 12 (twelve) hours. 06/18/22   Newman Pies, MD  DULoxetine (CYMBALTA) 30 MG capsule Take 30 mg by mouth at  bedtime.    [provider]  DULoxetine (CYMBALTA) 60 MG capsule Take 60 mg by mouth daily.    [provider]  Emollient (GOLD BOND HEALING) LOTN Apply 1 application  topically daily. May keep at bedside.    [provider]  esomeprazole (NEXIUM) 40 MG capsule Take 40 mg by mouth 2 (two) times daily.     [provider]  fluticasone (FLONASE) 50 MCG/ACT nasal spray Place 2 sprays into both nostrils daily. 10/11/19    [provider]  gabapentin (NEURONTIN) 600 MG tablet Take 1,200 mg by mouth in the morning, at noon, and at bedtime. 05/10/22   [provider]  guaiFENesin (ROBITUSSIN) 100 MG/5ML liquid Take 15 mLs by mouth every 6 (six) hours as needed for cough.    [provider]  hydrOXYzine (ATARAX) 10 MG tablet Take 10-30 mg by mouth at bedtime as needed for itching.    [provider]  ibandronate (BONIVA) 150 MG tablet Take 150 mg by mouth every 30 (thirty) days. 03/06/22   [provider]  ketoconazole (NIZORAL) 2 % shampoo MASSAGE INTO SCALP AND LET SIT 10-15 MINS BEFORE RINSING. USE 2X/WEEK. Patient not taking: Reported on 06/10/2022 05/06/22   Brendolyn Patty, MD  levocetirizine (XYZAL) 5 MG tablet Take 5 mg by mouth every evening.    [provider]  linaclotide (LINZESS) 290 MCG CAPS capsule Take 290 mcg by mouth daily.    [provider]  loperamide (IMODIUM A-D) 2 MG tablet Take 4 mg by mouth 4 (four) times daily as needed for diarrhea or loose stools.    [provider]  losartan (COZAAR) 100 MG tablet Take 100 mg by mouth daily. 05/10/22   [provider]  magnesium hydroxide (MILK OF MAGNESIA) 400 MG/5ML suspension Take 30 mLs by mouth daily as needed (constipation).    [provider]  menthol-zinc oxide (GOLD BOND) powder Apply 1 application  topically daily as needed (irritation).    [provider]  methimazole (TAPAZOLE) 5 MG tablet Take 5 mg by mouth daily.    [provider]  methocarbamol (ROBAXIN) 750 MG tablet Take 1 tablet (750 mg total) by mouth every 8 (eight) hours as needed for muscle spasms. 07/17/21   [name], MD  Multiple Vitamin (MULTIVITAMIN WITH MINERALS) TABS tablet Take 1 tablet by mouth daily.    [provider]  OVER THE COUNTER MEDICATION Apply 1 application  topically See admin instructions. Barrier Cream. Apply topically after toileting as needed to  prevent skin breakdown. May be kept in residents room.    [provider]  oxyCODONE-acetaminophen (PERCOCET/ROXICET) 5-325 MG tablet Take 1-2 tablets by mouth every 4 (four) hours as needed for moderate pain. 06/18/22   Newman Pies, MD  potassium chloride (KLOR-CON) 10 MEQ tablet Take 10 mEq by mouth daily. 05/10/22   [provider]  propranolol (INDERAL) 80 MG tablet Take 80 mg by mouth 2 (two) times daily.    [provider]  pyrithione zinc (HEAD AND SHOULDERS) 1 % shampoo Apply 1 application  topically daily as needed for itching (dandruff).    [provider]  RESTASIS 0.05 % ophthalmic emulsion Place 1 drop into both eyes 2 (two) times daily. 04/24/22   [provider]  Skin Protectants, Misc. (BAZA PROTECT EX) Apply 1 application  topically as needed (skin breakdown).    [provider]  sucralfate (CARAFATE) 1 g tablet Take 1 g by mouth 4 (four) times daily -  before meals and at bedtime.    [provider]  tamsulosin (FLOMAX) 0.4 MG CAPS capsule Take 1 capsule (0.4 mg total) by mouth daily after breakfast. Patient not taking: Reported on 06/10/2022 05/09/22   Abbie Sons, MD  torsemide (DEMADEX) 10 MG tablet Take 10 mg by mouth daily. 05/10/22   [provider]  traMADol (ULTRAM) 50 MG tablet Take 50 mg by mouth every 6 (six) hours as needed for severe pain.    [provider]  traZODone (DESYREL) 150 MG tablet Take 75 mg by mouth at bedtime as needed for sleep.     [provider]  triamcinolone cream (KENALOG) 0.1 % Apply to affected area rash on abdomen 1-2 times a day as needed for itch. Avoid face, groin, axilla. Patient not taking: Reported on 06/10/2022 12/18/21   Brendolyn Patty, MD  vitamin E 180 MG (400 UNITS) capsule Take 400 Units by mouth daily.    [provider]      Allergies    Cephalexin, Ibuprofen, Potassium chloride, Shellfish allergy, and Aspirin    Review of  Systems   Review of Systems  Physical Exam Updated Vital Signs BP 134/64 (BP Location: Right Arm)   Pulse 80   Temp 98.6 F (37 C) (Oral)   Resp 15   SpO2 98%  Physical Exam Vitals and nursing note reviewed.  Constitutional:      Appearance: She is obese.  HENT:     Head: Normocephalic.     Right Ear: External ear normal.     Left Ear: External ear normal.     Nose: Nose normal.     Mouth/Throat:     Pharynx: Oropharynx is clear.  Eyes:     Pupils: Pupils are equal, round, and reactive to light.  Cardiovascular:     Rate and Rhythm: Normal rate and regular rhythm.     Pulses: Normal pulses.  Pulmonary:     Effort: Pulmonary effort is normal.  Abdominal:     Palpations: Abdomen is soft.  Musculoskeletal:        General: Normal range of motion.     Cervical back: Normal range of motion.     Comments: Back examined and a small dressing in place and has some serosanguineous drainage there is no erythema outside of dressing and no point tenderness around area  Skin:    Capillary Refill: Capillary refill takes less than 2 seconds.  Neurological:     Mental Status: She is alert.     Comments: Patient poor effort for straight leg raises Toe dorsiflexion and plantarflexion intact Patient is able to bend her legs and roll to the side for me     ED Results / Procedures / Treatments   Labs (all labs ordered are listed, but only abnormal results are displayed) Labs Reviewed - No data to display  EKG None  Radiology No results found.  Procedures Procedures    Medications Ordered in ED Medications - No data to display  ED Course/ Medical Decision Making/ A&P                           Medical Decision Making 66 year old female with recent back surgery presents with worsening pain, urinary retention, and bilateral lower extremity weakness Patient seen and evaluated at [facility] and sent here for neurosurgical consult. I reviewed her labs.  Dr. Arnoldo Morale is in the  department as has seen her and has assumed  her care.  Amount and/or Complexity of Data Reviewed Discussion of management or test interpretation with external provider(s): Discussed with Dr. Arnoldo Morale, on-call for neurosurgery.           Final Clinical Impression(s) / ED Diagnoses Final diagnoses:  Post-operative pain  Urinary retention  Back pain with history of spinal surgery    Rx / DC Orders ED Discharge Orders     None         Pattricia Boss, MD 06/19/22 [phone]

## 2022-06-19 NOTE — H&P (Signed)
Subjective: The patient is a 66 year old black female on whom I performed a lumbar decompression, instrumentation and fusion on 06/17/2022.  The surgery went well.  She was doing great on postoperative day 1.  She was discharged to a assisted living facility.  She tells me that upon arrival there she began having increasing back and bilateral leg pain.  She says she had difficulty walking and has taken 3 falls.  She was brought to [facility] ER and evaluated.  I was contacted and asked that she be transferred over to [facility].  Presently the patient complains of back pain, some numbness in her buttocks, and pain and weakness in her legs.  She denies perineal numbness.  By report she has difficulty urinating and had 500 cc in her bladder upon insertion of a Foley catheter.  Past Medical History:  Diagnosis Date   Anxiety    Asthma    Breast mass    LEFT x 3 months per pt and palpated by physician   Cervical disc disease    Chronic pain syndrome    COPD (chronic obstructive pulmonary disease) (HCC)    Degenerative disc disease, lumbar    osteoarthritis   Depression    Diabetes mellitus without complication (HCC)    Fibromyalgia    Fibromyalgia    GERD (gastroesophageal reflux disease)    Glaucoma    Graves disease    Headache    migraines   Hemorrhoids    History of hiatal hernia    Hyperlipidemia    Hypertension    Hyperthyroidism    Lumbar disc disease    Obesity    Pre-diabetes    Psoriatic arthritis ([place])    Thyroid disease     Past Surgical History:  Procedure Laterality Date   BACK SURGERY     sumbar   CARDIAC CATHETERIZATION  2006   carpel tunn Right    carpel tunnel Left    CATARACT EXTRACTION Bilateral 2022   CESAREAN SECTION     COLONOSCOPY     COLONOSCOPY WITH PROPOFOL N/A 10/20/2020   Procedure: COLONOSCOPY WITH PROPOFOL;  Surgeon: Lesly Rubenstein, MD;  Location: [facility] ENDOSCOPY;  Service: Endoscopy;  Laterality: N/A;   ESOPHAGOGASTRODUODENOSCOPY (EGD) WITH PROPOFOL  N/A 06/26/2017   Procedure: ESOPHAGOGASTRODUODENOSCOPY (EGD) WITH PROPOFOL;  Surgeon: Lollie Sails, MD;  Location: [facility] ENDOSCOPY;  Service: Endoscopy;  Laterality: N/A;   EXCISION MORTON'S NEUROMA Left 02/05/2017   Procedure: EXCISION MORTON'S NEUROMA;  Surgeon: Samara Deist, DPM;  Location: [facility];  Service: Podiatry;  Laterality: Left;  iva with local   FRACTURE SURGERY  1969   Left Elbow   HAND SURGERY Right    scar tissue removal   JOINT REPLACEMENT     right hip arthroplasty 08/25/15   KNEE ARTHROPLASTY Left 08/11/2017   Procedure: COMPUTER ASSISTED TOTAL KNEE ARTHROPLASTY;  Surgeon: Dereck Leep, MD;  Location: [facility] ORS;  Service: Orthopedics;  Laterality: Left;   KNEE ARTHROPLASTY Right 02/05/2021   Procedure: COMPUTER ASSISTED TOTAL KNEE ARTHROPLASTY;  Surgeon: Dereck Leep, MD;  Location: [facility] ORS;  Service: Orthopedics;  Laterality: Right;   KNEE ARTHROSCOPY Right 07/02/2018   Procedure: ARTHROSCOPY KNEE, Medial and Lateral  Chondroplasty;  Surgeon: Dereck Leep, MD;  Location: [facility] ORS;  Service: Orthopedics;  Laterality: Right;   NECK SURGERY     "disk implant"   REVERSE SHOULDER ARTHROPLASTY Left 06/24/2019   Procedure: REVERSE SHOULDER ARTHROPLASTY;  Surgeon: Corky Mull, MD;  Location: [facility] ORS;  Service: Orthopedics;  Laterality: Left;   SHOULDER SURGERY Right    spur removal   SPINAL CORD STIMULATOR REMOVAL N/A 04/09/2021   Procedure: REMOVAL SPINAL CORD STIMULATOR & PULSE GENERATOR;  Surgeon: [name], MD;  Location: [facility] ORS;  Service: Neurosurgery;  Laterality: N/A;  2nd case   THORACIC LAMINECTOMY FOR SPINAL CORD STIMULATOR N/A 08/07/2020   Procedure: THORACIC SPINAL CORD STIMULATOR VIA LAMINECTOMY, PULSE GENERATOR;  Surgeon: [name], MD;  Location: [facility] ORS;  Service: Neurosurgery;  Laterality: N/A;   TOTAL HIP ARTHROPLASTY Right    TUBAL LIGATION      Allergies  Allergen Reactions   Cephalexin Hives   Ibuprofen Nausea  And Vomiting    Flu like symptoms   Potassium Chloride Itching   Shellfish Allergy Nausea And Vomiting   Aspirin Nausea And Vomiting    Social History   Tobacco Use   Smoking status: Former    Types: Cigarettes    Quit date: 1994    Years since quitting: 29.8   Smokeless tobacco: Never  Substance Use Topics   Alcohol use: No    Alcohol/week: 0.0 standard drinks of alcohol    Family History  Problem Relation Age of Onset   Cancer Mother    Heart disease Father    Breast cancer Neg Hx    Prior to Admission medications   Medication Sig Start Date End Date Taking? Authorizing Provider  albuterol (VENTOLIN HFA) 108 (90 Base) MCG/ACT inhaler Inhale 2 puffs into the lungs every 4 (four) hours as needed for shortness of breath or wheezing. 01/24/22   [provider]  alum & mag hydroxide-simeth (ANTACID LIQUID) 200-200-20 MG/5ML suspension Take 30 mLs by mouth 4 (four) times daily as needed for indigestion or heartburn. Notify provider if symptoms persist for more than 3 days    [provider]  atorvastatin (LIPITOR) 10 MG tablet Take 10 mg by mouth daily after supper.     [provider]  budesonide-formoterol (SYMBICORT) 160-4.5 MCG/ACT inhaler Inhale 2 puffs into the lungs 2 (two) times daily.    [provider]  calcium carbonate (OS-CAL) 600 MG TABS tablet Take 600 mg by mouth every other day.    [provider]  celecoxib (CELEBREX) 200 MG capsule Take 200 mg by mouth 2 (two) times daily.    [provider]  Cholecalciferol (VITAMIN D3 SUPER STRENGTH) 50 MCG (2000 UT) TABS Take 3,000 Units by mouth daily.    [provider]  clonazePAM (KLONOPIN) 0.5 MG tablet Take 0.5 mg by mouth at bedtime as needed for anxiety. 05/21/22   [provider]  [employer] Extract (NEUTROGENA T/GEL EX) Apply 1 application  topically daily as needed (dandruff).    [provider]  Cyanocobalamin (B-12) 2500 MCG TABS Take 5,000  mcg by mouth every other day.    [provider]  diphenhydrAMINE (BENADRYL) 25 mg capsule Take 25 mg by mouth 4 (four) times daily as needed for itching or allergies.    [provider]  diphenhydrAMINE (BENADRYL) 25 MG tablet Take 25 mg by mouth at bedtime as needed for sleep or itching.    [provider]  diphenhydrAMINE-zinc acetate (BENADRYL ITCH STOPPING) cream Apply 1 application  topically 3 (three) times daily as needed for itching.    [provider]  docusate sodium (COLACE) 100 MG capsule Take 1 capsule (100 mg total) by mouth 2 (two) times daily. 06/18/22   Newman Pies, MD  doxycycline (VIBRA-TABS) 100 MG  tablet Take 1 tablet (100 mg total) by mouth every 12 (twelve) hours. 06/18/22   Newman Pies, MD  DULoxetine (CYMBALTA) 30 MG capsule Take 30 mg by mouth at bedtime.    [provider]  DULoxetine (CYMBALTA) 60 MG capsule Take 60 mg by mouth daily.    [provider]  Emollient (GOLD BOND HEALING) LOTN Apply 1 application  topically daily. May keep at bedside.    [provider]  esomeprazole (NEXIUM) 40 MG capsule Take 40 mg by mouth 2 (two) times daily.     [provider]  fluticasone (FLONASE) 50 MCG/ACT nasal spray Place 2 sprays into both nostrils daily. 10/11/19   [provider]  gabapentin (NEURONTIN) 600 MG tablet Take 1,200 mg by mouth in the morning, at noon, and at bedtime. 05/10/22   [provider]  guaiFENesin (ROBITUSSIN) 100 MG/5ML liquid Take 15 mLs by mouth every 6 (six) hours as needed for cough.    [provider]  hydrOXYzine (ATARAX) 10 MG tablet Take 10-30 mg by mouth at bedtime as needed for itching.    [provider]  ibandronate (BONIVA) 150 MG tablet Take 150 mg by mouth every 30 (thirty) days. 03/06/22   [provider]  ketoconazole (NIZORAL) 2 % shampoo MASSAGE INTO SCALP AND LET SIT 10-15 MINS BEFORE RINSING. USE 2X/WEEK. Patient  not taking: Reported on 06/10/2022 05/06/22   Brendolyn Patty, MD  levocetirizine (XYZAL) 5 MG tablet Take 5 mg by mouth every evening.    [provider]  linaclotide (LINZESS) 290 MCG CAPS capsule Take 290 mcg by mouth daily.    [provider]  loperamide (IMODIUM A-D) 2 MG tablet Take 4 mg by mouth 4 (four) times daily as needed for diarrhea or loose stools.    [provider]  losartan (COZAAR) 100 MG tablet Take 100 mg by mouth daily. 05/10/22   [provider]  magnesium hydroxide (MILK OF MAGNESIA) 400 MG/5ML suspension Take 30 mLs by mouth daily as needed (constipation).    [provider]  menthol-zinc oxide (GOLD BOND) powder Apply 1 application  topically daily as needed (irritation).    [provider]  methimazole (TAPAZOLE) 5 MG tablet Take 5 mg by mouth daily.    [provider]  methocarbamol (ROBAXIN) 750 MG tablet Take 1 tablet (750 mg total) by mouth every 8 (eight) hours as needed for muscle spasms. 07/17/21   [name], MD  Multiple Vitamin (MULTIVITAMIN WITH MINERALS) TABS tablet Take 1 tablet by mouth daily.    [provider]  OVER THE COUNTER MEDICATION Apply 1 application  topically See admin instructions. Barrier Cream. Apply topically after toileting as needed to prevent skin breakdown. May be kept in residents room.    [provider]  oxyCODONE-acetaminophen (PERCOCET/ROXICET) 5-325 MG tablet Take 1-2 tablets by mouth every 4 (four) hours as needed for moderate pain. 06/18/22   Newman Pies, MD  potassium chloride (KLOR-CON) 10 MEQ tablet Take 10 mEq by mouth daily. 05/10/22   [provider]  propranolol (INDERAL) 80 MG tablet Take 80 mg by mouth 2 (two) times daily.    [provider]  pyrithione zinc (HEAD AND SHOULDERS) 1 % shampoo Apply 1 application  topically daily as needed for itching (dandruff).    [provider]  RESTASIS 0.05 % ophthalmic emulsion  Place 1 drop into both eyes 2 (two) times daily. 04/24/22   [provider]  Skin Protectants, Misc. (BAZA PROTECT  EX) Apply 1 application  topically as needed (skin breakdown).    [provider]  sucralfate (CARAFATE) 1 g tablet Take 1 g by mouth 4 (four) times daily -  before meals and at bedtime.    [provider]  tamsulosin (FLOMAX) 0.4 MG CAPS capsule Take 1 capsule (0.4 mg total) by mouth daily after breakfast. Patient not taking: Reported on 06/10/2022 05/09/22   Abbie Sons, MD  torsemide (DEMADEX) 10 MG tablet Take 10 mg by mouth daily. 05/10/22   [provider]  traMADol (ULTRAM) 50 MG tablet Take 50 mg by mouth every 6 (six) hours as needed for severe pain.    [provider]  traZODone (DESYREL) 150 MG tablet Take 75 mg by mouth at bedtime as needed for sleep.     [provider]  triamcinolone cream (KENALOG) 0.1 % Apply to affected area rash on abdomen 1-2 times a day as needed for itch. Avoid face, groin, axilla. Patient not taking: Reported on 06/10/2022 12/18/21   Brendolyn Patty, MD  vitamin E 180 MG (400 UNITS) capsule Take 400 Units by mouth daily.    [provider]     Review of Systems  Positive ROS: As above  All other systems have been reviewed and were otherwise negative with the exception of those mentioned in the HPI and as above.  Objective: Vital signs in last 24 hours: Temp:  [98.6 F (37 C)-100.2 F (37.9 C)] 98.6 F (37 C) (10/25 1442) Pulse Rate:  [80-100] 80 (10/25 1442) Resp:  [15-24] 15 (10/25 1442) BP: (134-182)/(64-137) 134/64 (10/25 1442) SpO2:  [91 %-99 %] 98 % (10/25 1442) Weight:  [136.2 kg] 136.2 kg (10/25 0918) Estimated body mass index is 41.88 kg/m as calculated from the following:   Height as of an earlier encounter on 06/19/22: '5\' 11"'$  (1.803 m).   Weight as of an earlier encounter on 06/19/22: 136.2 kg.   Patient exam:  General: An obese 66 year old black female who  complains of back pain  HEENT: Normocephalic  Thorax: Symmetric  Abdomen: Protuberant and soft  Back exam: Her dressing is clean and dry.  Neurologic exam: The patient is alert and oriented x3.  Her strength is grossly normal in about a quadricep, gastrocnemius and dorsiflexors although she does giveaway.  Sensation is normal in the lower extremities.  She says she can feel her Foley catheter.    Data Review Lab Results  Component Value Date   WBC 8.3 06/19/2022   HGB 10.1 (L) 06/19/2022   HCT 31.4 (L) 06/19/2022   MCV 84.0 06/19/2022   PLT 129 (L) 06/19/2022   Lab Results  Component Value Date   NA 133 (L) 06/19/2022   K 3.0 (L) 06/19/2022   CL 101 06/19/2022   CO2 22 06/19/2022   BUN 10 06/19/2022   CREATININE 0.67 06/19/2022   GLUCOSE 129 (H) 06/19/2022   Lab Results  Component Value Date   INR 0.9 01/25/2021    Assessment/Plan: Back pain, urinary retention, leg pain/weakness: I have discussed the situation with the patient.  I am not sure how much an MRI will help [place] given her size, recent surgery and metal implants.  We discussed the various treatment options.  I recommended an exploration of her wound to presumably evacuate an epidural hematoma.  I described the surgery to her.  We have discussed the risk including risk of anesthesia, hemorrhage, infection, spinal fluid leak, medical risk, etc.  We discussed the alternative including  observation.  I have answered all questions.  She wants to proceed with surgery.  She last ate at around noon.  I have posted the case.   Ophelia Charter 06/19/2022 3:37 PM

## 2022-06-19 NOTE — Anesthesia Preprocedure Evaluation (Signed)
Anesthesia Evaluation  Patient identified by MRN, date of birth, ID band Patient awake    Reviewed: Allergy & Precautions, H&P , NPO status , Patient's Chart, lab work & pertinent test results  Airway Mallampati: III  TM Distance: >3 FB Neck ROM: Limited    Dental no notable dental hx. (+) Edentulous Upper, Edentulous Lower, Dental Advisory Given   Pulmonary asthma , COPD,  COPD inhaler, former smoker,    Pulmonary exam normal breath sounds clear to auscultation       Cardiovascular Exercise Tolerance: Good hypertension, Pt. on medications and Pt. on home beta blockers  Rhythm:Regular Rate:Normal     Neuro/Psych  Headaches, Anxiety Depression    GI/Hepatic Neg liver ROS, hiatal hernia, GERD  Medicated,  Endo/Other  diabetesMorbid obesity  Renal/GU negative Renal ROS  negative genitourinary   Musculoskeletal  (+) Arthritis , Fibromyalgia -S/p lumbar surgery 10/23 now with hematoma for exploration   Abdominal Normal abdominal exam  (+)   Peds  Hematology negative hematology ROS (+)   Anesthesia Other Findings   Reproductive/Obstetrics negative OB ROS                             Anesthesia Physical  Anesthesia Plan  ASA: 3  Anesthesia Plan: General   Post-op Pain Management:    Induction: Intravenous  PONV Risk Score and Plan: 4 or greater and Ondansetron, Dexamethasone and Midazolam  Airway Management Planned: Oral ETT, Video Laryngoscope Planned and Mask  Additional Equipment: None  Intra-op Plan:   Post-operative Plan: Extubation in OR  Informed Consent: I have reviewed the patients History and Physical, chart, labs and discussed the procedure including the risks, benefits and alternatives for the proposed anesthesia with the patient or authorized representative who has indicated his/her understanding and acceptance.     Dental advisory given  Plan Discussed with:  CRNA  Anesthesia Plan Comments: (PAT note by Karoline Caldwell, PA-C: Patient recently had echo and stress test ordered by her PCP at [facility] clinic for evaluation of dyspnea.  Stress test on 03/18/2022 showed normal LV function, normal wall motion, no evidence of scar or ischemia.  Echo 01/31/2022 showed normal biventricular function, mild tricuspid regurgitation.  Follows with pulmonology at [facility] clinic for history of mild COPD.  Former smoker, 22-year history, quit approximately 29 years ago.  Last seen 05/22/2022, noted to be doing well on Symbicort and as needed albuterol.  PFTs 10/18/2021 showed vc 2.38 liters ( 75 %), fev1 1.85 liters ( 74 %), fef 73 %, exp looks normal vs early restriction. Arlyce Harman is c/w early restriction.  Concern for OSA, patient was previously refused sleep study.  Follows with rheumatology at [facility] clinic for history of psoriatic arthritis maintained on [place].  Non-insulin-dependent DM2, last A1c 6.4 on 03/26/2022.  History of Graves' disease, maintained on methimazole.  History of prior spinal cord stimulator status post removal 04/09/2021.  History of C5-6 ACDF.  Review of previous anesthesia record shows that video laryngoscope has been used for recent intubations.  Preop labs reviewed, chronic leukopenia with WBC 2.6, otherwise unremarkable.  EKG 06/10/22: Normal sinus rhythm. Rate 73. Minimal voltage criteria for LVH, may be normal variant ( R in aVL ). Inferior infarct , age undetermined. Cannot rule out Anterior infarct , age undetermined  Nuclear stress 03/18/2022 (Care Everywhere): 1. Normal left ventricular function  2. Normal wall motion  3. No evidence for scar or ischemia  TTE 01/31/2022 (Care Everywhere):  INTERPRETATION  NORMAL LEFT VENTRICULAR SYSTOLIC FUNCTION  NORMAL RIGHT VENTRICULAR SYSTOLIC FUNCTION  MILD VALVULAR REGURGITATION (See above)  NO VALVULAR STENOSIS   )        Anesthesia Quick Evaluation

## 2022-06-19 NOTE — ED Notes (Signed)
This RN to bedside for in and out cath. Harriette Bouillon as second. In and out completed per protocol.

## 2022-06-19 NOTE — ED Notes (Signed)
Called Thomas at [place] for neurosurgery and transfer  [phone]

## 2022-06-19 NOTE — Op Note (Signed)
Brief history: The patient is a 66 year old obese black female on whom I performed a lumbar decompression, instrumentation and fusion on 06/19/2022.  She initially did great but developed increasing back pain with bilateral leg pain and weakness and some urinary retention/incontinence.  She presented to [facility] and was transferred to [facility].  I discussed the situation and recommended exploration of her wound with removal of presumed epidural hematoma.  The patient agreed.  The patient ate a heavy lunch and we had to delay surgery per anesthesia.  Preop diagnosis: Lumbago, leg pain, epidural hematoma, urinary retention/incontinence  Postop diagnosis: The same  Procedure: Exploration of lumbar wound with evacuation of epidural hematoma  Surgeon: Dr. Earle Gell  Assistant: None  Anesthesia: General endotracheal  Estimated blood loss: 75 cc  Specimens: None  Drains: 1 medium Hemovac drain in the epidural space  Description of procedure: The patient was brought to the operating room by the anesthesia team.  General endotracheal anesthesia was induced.  The patient was turned to the prone position on the [place] table.  Her lumbar region was prepared with Betadine scrub and Betadine solutions.  Sterile drapes were applied.  I then used the scalpel to incise through the patient's fresh surgical incision.  Upon making the incision there was release of liquefied hematoma under pressure.  I divided the cutaneous and fascial sutures with the field scissors.  We used the cerebellar retractor and McCullough retractor for exposure.  I dissected deeper removing more liquefied hematoma with suction.  I inspected the epidural space.  There was some more clotted hematoma bilaterally at L3-4 and L4-5.  I removed this with suction irrigation.  We decompressed the thecal sac.  I did not see any active bleeding points.  We had the wound out with saline solution.  I placed a  medium Hemovac drain in the epidural space and tunneled it out through a separate stab wound.  We then removed the retractors.  I reapproximated patient's lumbar fascia with interrupted #1 Vicryl suture.  I reapproximated the subcutaneous tissue with interrupted 2-0 Vicryl suture.  I reapproximated the skin with a running 2-0 nylon suture.  The wound was then coated with bacitracin ointment.  A sterile dressing was applied.  The drapes were removed.  By report all sponge, instrument, and needle counts were correct at the end of this case.

## 2022-06-19 NOTE — ED Provider Notes (Addendum)
[facility] Provider Note    Event Date/Time   First MD Initiated Contact with Patient 06/19/22 [phone]     (approximate)   History   Back Pain (Pt. To ED from the [facility] via EMS for low back pain, buttock and bilat leg pain. Pt. States she had back surgery Monday. Pt. States she had bladder incontinence yesterday, and falls x3 last night because of the pain in her legs. Pt. Denies hitting her head. )   HPI  Christine Higgins is a 66 y.o. female status post recent lumbar surgery with neurosurgery at [facility] on Monday presents to the ER for frequent falls worsening lower extremity weakness left greater than right as well as some saddle anesthesia.  Having persistent worsening pain over the past 24 hours no fevers no chills.     Physical Exam   Triage Vital Signs: ED Triage Vitals  Enc Vitals Group     BP 06/19/22 0916 (!) 162/71     Pulse Rate 06/19/22 0916 93     Resp 06/19/22 0916 20     Temp 06/19/22 0916 99.8 F (37.7 C)     Temp Source 06/19/22 0916 Oral     SpO2 06/19/22 0916 96 %     Weight 06/19/22 0918 (!) 300 lb 4.3 oz (136.2 kg)     Height 06/19/22 0918 '5\' 11"'$  (1.803 m)     Head Circumference --      Peak Flow --      Pain Score 06/19/22 0917 9     Pain Loc --      Pain Edu? --      Excl. in [place]? --     Most recent vital signs: Vitals:   06/19/22 1030 06/19/22 1045  BP: (!) 182/81   Pulse: 96 94  Resp: 18 19  Temp:    SpO2: 92% 91%     Constitutional: Alert  Eyes: Conjunctivae are normal.  Head: Atraumatic. Nose: No congestion/rhinnorhea. Mouth/Throat: Mucous membranes are moist.   Neck: Painless ROM.  Cardiovascular:   Good peripheral circulation. Respiratory: Normal respiratory effort.  No retractions.  Gastrointestinal: Soft and nontender.  There is decreased sensation to light touch in the left saddle region.  Rectal tone is not flaccid but unable to bear down on exam. Musculoskeletal:  no deformity Neurologic:  MAE  spontaneously. No gross focal neurologic deficits are appreciated.  Skin:  Skin is warm, dry and intact. No rash noted. Psychiatric: Mood and affect are normal. Speech and behavior are normal.    ED Results / Procedures / Treatments   Labs (all labs ordered are listed, but only abnormal results are displayed) Labs Reviewed  CBC WITH DIFFERENTIAL/PLATELET - Abnormal; Notable for the following components:      Result Value   RBC 3.74 (*)    Hemoglobin 10.1 (*)    HCT 31.4 (*)    Platelets 129 (*)    Monocytes Absolute 1.3 (*)    All other components within normal limits  BASIC METABOLIC PANEL - Abnormal; Notable for the following components:   Sodium 133 (*)    Potassium 3.0 (*)    Glucose, Bld 129 (*)    All other components within normal limits  URINALYSIS, ROUTINE W REFLEX MICROSCOPIC     EKG  ED ECG REPORT I, Merlyn Lot, the attending physician, personally viewed and interpreted this ECG.   Date: 06/19/2022  EKG Time: 9:18  Rate: 90  Rhythm: sinus  Axis: normal  Intervals: normal  ST&T Change: no stemi, no depressions       PROCEDURES:  Critical Care performed: no  Procedures   MEDICATIONS ORDERED IN ED: Medications  morphine (PF) 4 MG/ML injection 4 mg (4 mg Intravenous Given 06/19/22 1013)  ondansetron (ZOFRAN) injection 4 mg (4 mg Intravenous Given 06/19/22 1012)     IMPRESSION / MDM / ASSESSMENT AND PLAN / ED COURSE  I reviewed the triage vital signs and the nursing notes.                              Differential diagnosis includes, but is not limited to, contusion, hematoma, cystitis, cauda equina, spinal stenosis, abscess   Patient presenting to the ER for evaluation of symptoms as described above.  Based on symptoms, risk factors and considered above differential, this presenting complaint could reflect a potentially life-threatening illness therefore the patient will be placed on continuous pulse oximetry and telemetry for  monitoring.  Laboratory evaluation will be sent to evaluate for the above complaints.  Patient presented to ER with pain symptoms as described above concerning for the blood differential.  Abdominal exam soft benign.  Surgical incision appears intact no dehiscence no drainage.  Exam as above.  Border pain medication.  Based on exam we will consult neurosurgeon as she is recently postop further recommendation.    Clinical Course as of 06/19/22 1150  Wed Jun 19, 2022  1113 Discussed case in consultation with Dr. Arnoldo Morale who has recommended transfer ED to ED for neurosurgical evaluation at [facility]. [PR]  1150 Patient was in out catheterize with over a liter out.  Will place Foley catheter as she is unable to void at this time.  Patient has been accepted ED to ED. [PR]    Clinical Course User Index [PR] Merlyn Lot, MD      FINAL CLINICAL IMPRESSION(S) / ED DIAGNOSES   Final diagnoses:  Post-op pain     Rx / DC Orders   ED Discharge Orders     None        Note:  This document was prepared using Dragon voice recognition software and may include unintentional dictation errors.    Merlyn Lot, MD 06/19/22 1117    Merlyn Lot, MD 06/19/22 1150

## 2022-06-19 NOTE — ED Triage Notes (Signed)
Pt. To ED from the [facility] via EMS for low back pain, buttock and bilat leg pain. Pt. States she had back surgery Monday. Pt. States she had bladder incontinence yesterday, and falls x3 last night because of the pain in her legs. Pt. Denies hitting her head.

## 2022-06-19 NOTE — ED Triage Notes (Signed)
Laminectomy on Monday. Pt was able to walk after surgery until yesterday. Pt reports loss of bladder control and fall x 3 due to weakness and numbness to bilateral legs. Pt is a transfer from [facility] today as surgery was done here. Alert and oriented x 4.

## 2022-06-19 NOTE — Anesthesia Procedure Notes (Signed)
Procedure Name: Intubation Date/Time: 06/19/2022 8:22 PM  Performed by: Barrington Ellison, CRNAPre-anesthesia Checklist: Patient identified, Emergency Drugs available, Suction available and Patient being monitored Patient Re-evaluated:Patient Re-evaluated prior to induction Oxygen Delivery Method: Circle System Utilized Preoxygenation: Pre-oxygenation with 100% oxygen Induction Type: IV induction and Rapid sequence Ventilation: Mask ventilation without difficulty Laryngoscope Size: Glidescope and 4 Tube type: Oral Tube size: 7.0 mm Number of attempts: 1 Airway Equipment and Method: Stylet and Oral airway Placement Confirmation: ETT inserted through vocal cords under direct vision, positive ETCO2 and breath sounds checked- equal and bilateral Secured at: 21 cm Tube secured with: Tape Dental Injury: Teeth and Oropharynx as per pre-operative assessment  Difficulty Due To: Difficulty was anticipated, Difficult Airway- due to large tongue and Difficult Airway- due to reduced neck mobility Future Recommendations: Recommend- induction with short-acting agent, and alternative techniques readily available Comments: Elective Glidescope d/t body habitus

## 2022-06-20 ENCOUNTER — Encounter (HOSPITAL_COMMUNITY): Payer: Self-pay | Admitting: Neurosurgery

## 2022-06-20 MED ORDER — CHLORHEXIDINE GLUCONATE CLOTH 2 % EX PADS
6.0000 | MEDICATED_PAD | Freq: Every day | CUTANEOUS | Status: DC
  Administered 2022-06-20 – 2022-06-22 (×3): 6 via TOPICAL

## 2022-06-20 MED FILL — Thrombin For Soln 5000 Unit: CUTANEOUS | Qty: 5000 | Status: AC

## 2022-06-20 NOTE — Evaluation (Signed)
Physical Therapy Evaluation Patient Details Name: Christine Higgins MRN: [id number] DOB: Jan 22, 1956 Today's Date: 06/20/2022  History of Present Illness  Christine Higgins is a 66 yo female who underwent L3-4 decompression instrumentation fusion with myotomy's at L2-3 and L4-5 06/17/22 and was discharged to ALF then had 3 subsequent falls and bladder incontinence so returned and found to have hematoma and now s/p exploration of wound with hematoma evacuation on 06/19/22. PMHx: anxiety, COPD, DM, fibromyalgia, HLD, HTN, hyperthyroidism  Clinical Impression  Patient presents with decreased mobility due to deficits listed in PT problem list.  Currently needing heavy mod to max A for bed mobility and transfers bed to chair only.  Unable to tolerate much movement so no ambulation today as well as feeling her legs are really weak.  She will benefit from skilled PT in the acute setting and from follow up STSNF level rehab prior to d/c back to ALF.       Recommendations for follow up therapy are one component of a multi-disciplinary discharge planning process, led by the attending physician.  Recommendations may be updated based on patient status, additional functional criteria and insurance authorization.  Follow Up Recommendations Skilled nursing-short term rehab (<3 hours/day) Can patient physically be transported by private vehicle: No    Assistance Recommended at Discharge Frequent or constant Supervision/Assistance  Patient can return home with the following  Two people to help with walking and/or transfers;Two people to help with bathing/dressing/bathroom;Help with stairs or ramp for entrance;Assist for transportation;Assistance with cooking/housework    Equipment Recommendations None recommended by PT  Recommendations for Other Services       Functional Status Assessment Patient has had a recent decline in their functional status and demonstrates the ability to make significant improvements in  function in a reasonable and predictable amount of time.     Precautions / Restrictions Precautions Precautions: Fall;Back Precaution Comments: patient able to state back precautions, daughter brought brace from ALF Required Braces or Orthoses: Spinal Brace Spinal Brace: Lumbar corset;Applied in sitting position      Mobility  Bed Mobility Overal bed mobility: Needs Assistance Bed Mobility: Rolling, Sidelying to Sit Rolling: Min assist Sidelying to sit: Mod assist       General bed mobility comments: assist and cues for technique and reaching rail, increased time to wiggle feet to EOB and to lift trunk with increased pain    Transfers Overall transfer level: Needs assistance Equipment used: Rolling walker (2 wheels) Transfers: Sit to/from Stand, Bed to chair/wheelchair/BSC Sit to Stand: Mod assist, From elevated surface, Max assist   Step pivot transfers: Mod assist, +2 safety/equipment       General transfer comment: sit to stand x 2 from EOB with much increased time and a lot of pain had daughter stand close for pt comfort, first time legs felt too weak so sat back on EOB then stood second time and moved to recliner with increased time, cues for moving walker and feet    Ambulation/Gait                  Stairs            Wheelchair Mobility    Modified Rankin (Stroke Patients Only)       Balance Overall balance assessment: Needs assistance Sitting-balance support: Feet supported, No upper extremity supported Sitting balance-Leahy Scale: Fair     Standing balance support: Bilateral upper extremity supported, Reliant on assistive device for balance Standing balance-Leahy Scale: Poor Standing balance  comment: Heavy reliance on UE support due to LE weakness                             Pertinent Vitals/Pain Pain Assessment Pain Assessment: 0-10 Pain Score: 9  Pain Location: back Pain Descriptors / Indicators: Discomfort, Guarding,  Operative site guarding, Moaning, Grimacing Pain Intervention(s): Monitored during session, Limited activity within patient's tolerance, Repositioned    Home Living Family/patient expects to be discharged to:: Assisted living                 Home Equipment: Conservation officer, nature (2 wheels);Shower seat;Grab bars - tub/shower;BSC/3in1;Adaptive equipment Additional Comments: no steps    Prior Function Prior Level of Function : Needs assist             Mobility Comments: having difficulty with walking and transfers at ALF since surgery with LE weakness ADLs Comments: Staff assists with LB ADLs and IADLs     Hand Dominance   Dominant Hand: Right    Extremity/Trunk Assessment   Upper Extremity Assessment Upper Extremity Assessment: Defer to OT evaluation    Lower Extremity Assessment Lower Extremity Assessment: RLE deficits/detail;LLE deficits/detail RLE Deficits / Details: AAROM with stiffness throughout and painful in back, strength grossly 3-/5 knee extension, and ankle DF LLE Deficits / Details: AAROM with stiffness throughout and painful in back, strength grossly 3-/5 knee extension, and ankle DF    Cervical / Trunk Assessment Cervical / Trunk Assessment: Back Surgery  Communication   Communication: No difficulties  Cognition Arousal/Alertness: Awake/alert Behavior During Therapy: WFL for tasks assessed/performed Overall Cognitive Status: Within Functional Limits for tasks assessed                                          General Comments General comments (skin integrity, edema, etc.): Daughter present and voicing frustration with lack of assistance at ALF when pt returned from hospital and hopeful for inpatient rehab    Exercises     Assessment/Plan    PT Assessment Patient needs continued PT services  PT Problem List Decreased strength;Decreased range of motion;Decreased activity tolerance;Decreased balance;Decreased mobility;Decreased  knowledge of use of DME;Decreased safety awareness;Decreased knowledge of precautions;Pain       PT Treatment Interventions DME instruction;Therapeutic activities;Patient/family education;Therapeutic exercise;Gait training;Balance training;Functional mobility training;Neuromuscular re-education    PT Goals (Current goals can be found in the Care Plan section)  Acute Rehab PT Goals Patient Stated Goal: to go to rehab in [place] PT Goal Formulation: With patient/family Time For Goal Achievement: 07/04/22 Potential to Achieve Goals: Good    Frequency Min 3X/week     Co-evaluation               AM-PAC PT "6 Clicks" Mobility  Outcome Measure Help needed turning from your back to your side while in a flat bed without using bedrails?: A Lot Help needed moving from lying on your back to sitting on the side of a flat bed without using bedrails?: A Lot Help needed moving to and from a bed to a chair (including a wheelchair)?: A Lot Help needed standing up from a chair using your arms (e.g., wheelchair or bedside chair)?: Total Help needed to walk in hospital room?: Total Help needed climbing 3-5 steps with a railing? : Total 6 Click Score: 9    End of Session Equipment Utilized During  Treatment: Gait belt;Back brace Activity Tolerance: Patient limited by pain Patient left: in chair;with call bell/phone within reach;with family/visitor present Nurse Communication: Mobility status PT Visit Diagnosis: Other abnormalities of gait and mobility (R26.89);Difficulty in walking, not elsewhere classified (R26.2);Pain Pain - Right/Left:  (both) Pain - part of body: Leg (back)    Time: 8833-7445 PT Time Calculation (min) (ACUTE ONLY): 52 min   Charges:   PT Evaluation $PT Eval Moderate Complexity: 1 Mod PT Treatments $Therapeutic Activity: 23-37 mins        Magda Kiel, PT Acute Rehabilitation Services Office:[phone] 06/20/2022   Reginia Naas 06/20/2022, 12:24 PM

## 2022-06-20 NOTE — NC FL2 (Signed)
[place] MEDICAID FL2 LEVEL OF CARE SCREENING TOOL     IDENTIFICATION  Patient Name: Christine Higgins Birthdate: 05-Mar-1956 Sex: female Admission Date (Current Location): 06/19/2022  [facility] and [place] Number:  Herbalist and Address:  [facility] [place], [place] [address], [place], [place] [address]      Provider Number: [id number]  Attending Physician Name and Address:  Newman Pies, MD  Relative Name and Phone Number:  Jayma Volpi, [phone]    Current Level of Care: Hospital Recommended Level of Care: [place] Prior Approval Number:    Date Approved/Denied:   PASRR Number: [id number]  Discharge Plan: SNF    Current Diagnoses: Patient Active Problem List   Diagnosis Date Noted   Epidural hematoma ([place]) 06/19/2022   Spondylolisthesis of lumbar region 06/17/2022   Primary osteoarthritis of left hip 09/12/2021   Arthritis of sacroiliac joint of both sides 04/24/2021   Failed spinal cord stimulator ([place]) 04/24/2021   Total knee replacement status 02/05/2021   Piriformis syndrome of both sides 01/02/2021   Chronic pain 08/07/2020   Chronic midline thoracic back pain 03/27/2020   Psoriasis 12/22/2019   Psoriatic arthritis ([place]) 12/22/2019   Multiple thyroid nodules 10/01/2019   Urticaria, chronic 07/14/2019   History of colon polyps 07/09/2019   Status post reverse arthroplasty of shoulder, left 06/24/2019   History of lumbar laminectomy for spinal cord decompression (L3-L4, 2015) 02/25/2019   Lumbar radiculopathy 02/25/2019   Neutropenia ([place]) 02/16/2019   Primary osteoarthritis involving multiple joints 02/16/2019   Polyarthralgia 02/02/2019   Cervical spondylosis 09/14/2018   Tachycardia 08/18/2017   Status post total left knee replacement 08/11/2017   Chronic pain syndrome 07/30/2017   Fibromyalgia 07/30/2017   Pelvic pain in female 06/24/2017   Dysuria 06/24/2017   Generalized anxiety disorder 06/23/2017    Primary insomnia 06/23/2017   Lumbar spondylosis 06/23/2017   Stenosis of intervertebral foramina 06/23/2017   Right carpal tunnel syndrome 03/27/2017   HTN, goal below 140/90 03/05/2017   Graves' orbitopathy 02/17/2017   Primary osteoarthritis of right knee 10/18/2016   Bilateral arm weakness 07/16/2016   Spinal stenosis of lumbar region 03/22/2016   Back pain at L4-L5 level 02/13/2016   Depression, major, in remission ([place]) 12/21/2015   Failed back surgical syndrome 10/30/2015   DDD (degenerative disc disease), lumbosacral 10/30/2015   Status post total replacement of right hip 08/21/2015   Leg pain 07/28/2015   Health care maintenance 06/21/2015   Impingement syndrome of shoulder, left 10/20/2014   Severe obesity (BMI 35.0-35.9 with comorbidity) ([place]) 08/09/2014   Atherosclerosis of abdominal aorta ([place]) 07/29/2014   DM II (diabetes mellitus, type II), controlled ([place]) 04/19/2014   Hemorrhoids 03/14/2014   Osteoporosis 02/03/2014   Migraines 02/03/2014   Hyperlipidemia, unspecified 02/03/2014   GERD (gastroesophageal reflux disease) 02/03/2014    Orientation RESPIRATION BLADDER Height & Weight     Self, Time, Situation, Place  Normal Indwelling catheter, Continent Weight: (!) 300 lb 4.3 oz (136.2 kg) Height:  '5\' 11"'$  (180.3 cm)  BEHAVIORAL SYMPTOMS/MOOD NEUROLOGICAL BOWEL NUTRITION STATUS      Continent Diet (See DC summary)  AMBULATORY STATUS COMMUNICATION OF NEEDS Skin   Extensive Assist Verbally Surgical wounds (Back Incision)                       Personal Care Assistance Level of Assistance  Bathing, Feeding, Dressing Bathing Assistance: Maximum assistance Feeding assistance: Limited assistance Dressing Assistance: Maximum assistance  Functional Limitations Info  Sight, Hearing, Speech Sight Info: Adequate Hearing Info: Adequate Speech Info: Adequate    SPECIAL CARE FACTORS FREQUENCY  PT (By licensed PT), OT (By licensed OT)     PT Frequency: 5x  week OT Frequency: 5x week            Contractures Contractures Info: Not present    Additional Factors Info  Code Status, Allergies, Psychotropic Code Status Info: Full Allergies Info: Cephalexin   Ibuprofen   Potassium Chloride   Shellfish Allergy   Aspirin Psychotropic Info: Duloxetine         Current Medications (06/20/2022):  This is the current hospital active medication list Current Facility-Administered Medications  Medication Dose Route Frequency Provider Last Rate Last Admin   0.9 %  sodium chloride infusion  250 mL Intravenous Continuous Newman Pies, MD       [START ON 06/21/2022] acetaminophen (TYLENOL) tablet 650 mg  650 mg Oral Q4H PRN Newman Pies, MD       Or   Derrill Memo ON 06/21/2022] acetaminophen (TYLENOL) suppository 650 mg  650 mg Rectal Q4H PRN Newman Pies, MD       acetaminophen (TYLENOL) tablet 1,000 mg  1,000 mg Oral Q6H PRN Newman Pies, MD       acetaminophen (TYLENOL) tablet 1,000 mg  1,000 mg Oral Q6H Newman Pies, MD   1,000 mg at 06/20/22 1133   albuterol (PROVENTIL) (2.5 MG/3ML) 0.083% nebulizer solution 3 mL  3 mL Inhalation Q4H PRN Newman Pies, MD       alum & mag hydroxide-simeth (MAALOX/MYLANTA) 200-200-20 MG/5ML suspension 30 mL  30 mL Oral QID PRN Newman Pies, MD       atorvastatin (LIPITOR) tablet 10 mg  10 mg Oral QPC supper Newman Pies, MD       bisacodyl (DULCOLAX) suppository 10 mg  10 mg Rectal Daily PRN Newman Pies, MD       calcium carbonate (OS-CAL - dosed in mg of elemental calcium) tablet 625 mg  625 mg Oral Manya Silvas, MD   625 mg at 06/20/22 0932   cetirizine (ZYRTEC) tablet 10 mg  10 mg Oral QPM Newman Pies, MD       Chlorhexidine Gluconate Cloth 2 % PADS 6 each  6 each Topical Daily Newman Pies, MD   6 each at 06/20/22 0933   cholecalciferol (VITAMIN D3) 25 MCG (1000 UNIT) tablet 3,000 Units  3,000 Units Oral Daily Newman Pies, MD   3,000 Units at 06/20/22 0932    clonazePAM (KLONOPIN) tablet 0.5 mg  0.5 mg Oral BID PRN Newman Pies, MD       cyanocobalamin (VITAMIN B12) tablet 5,000 mcg  5,000 mcg Oral Manya Silvas, MD   5,000 mcg at 06/20/22 0932   cyclobenzaprine (FLEXERIL) tablet 10 mg  10 mg Oral TID PRN Newman Pies, MD       cycloSPORINE (RESTASIS) 0.05 % ophthalmic emulsion 1 drop  1 drop Both Eyes BID Newman Pies, MD   1 drop at 06/20/22 2409   diphenhydrAMINE (BENADRYL) capsule 25 mg  25 mg Oral QHS PRN Newman Pies, MD       diphenhydrAMINE (BENADRYL) capsule 25 mg  25 mg Oral QID PRN Newman Pies, MD       diphenhydrAMINE-zinc acetate (BENADRYL) 7-3.5 % cream 1 Application  1 Application Topical TID PRN Newman Pies, MD       docusate sodium (COLACE) capsule 100 mg  100 mg Oral BID Newman Pies, MD  100 mg at 06/20/22 0932   doxycycline (VIBRA-TABS) tablet 100 mg  100 mg Oral BID Newman Pies, MD   100 mg at 06/20/22 0740   DULoxetine (CYMBALTA) DR capsule 30 mg  30 mg Oral QHS Newman Pies, MD   30 mg at 06/20/22 0109   DULoxetine (CYMBALTA) DR capsule 60 mg  60 mg Oral Daily Newman Pies, MD   60 mg at 06/20/22 0931   fluticasone (FLONASE) 50 MCG/ACT nasal spray 2 spray  2 spray Each Nare Daily Newman Pies, MD   2 spray at 06/20/22 5176   gabapentin (NEURONTIN) tablet 600 mg  600 mg Oral TID Newman Pies, MD   600 mg at 06/20/22 0932   guaiFENesin (ROBITUSSIN) 100 MG/5ML liquid 15 mL  15 mL Oral Q6H PRN Newman Pies, MD       hydrOXYzine (ATARAX) tablet 10-30 mg  10-30 mg Oral QHS PRN Newman Pies, MD       linaclotide Rolan Lipa) capsule 290 mcg  290 mcg Oral Daily Newman Pies, MD   290 mcg at 06/20/22 0932   loperamide (IMODIUM) capsule 4 mg  4 mg Oral QID PRN Newman Pies, MD       losartan (COZAAR) tablet 100 mg  100 mg Oral Daily Newman Pies, MD   100 mg at 06/20/22 0932   magnesium hydroxide (MILK OF MAGNESIA) suspension 30 mL  30 mL Oral Daily PRN Newman Pies, MD       menthol-cetylpyridinium (CEPACOL) lozenge 3 mg  1 lozenge Oral PRN Newman Pies, MD       Or   phenol (CHLORASEPTIC) mouth spray 1 spray  1 spray Mouth/Throat PRN Newman Pies, MD       methimazole (TAPAZOLE) tablet 5 mg  5 mg Oral Daily Newman Pies, MD   5 mg at 06/20/22 0932   methocarbamol (ROBAXIN) tablet 750 mg  750 mg Oral Q8H PRN Newman Pies, MD   750 mg at 06/20/22 0740   mometasone-formoterol (DULERA) 200-5 MCG/ACT inhaler 2 puff  2 puff Inhalation BID Newman Pies, MD   2 puff at 06/20/22 0742   morphine (PF) 2 MG/ML injection 4 mg  4 mg Intravenous Q2H PRN Newman Pies, MD       multivitamin with minerals tablet 1 tablet  1 tablet Oral Daily Newman Pies, MD   1 tablet at 06/20/22 0932   ondansetron (ZOFRAN) tablet 4 mg  4 mg Oral Q6H PRN Newman Pies, MD       Or   ondansetron [facility]) injection 4 mg  4 mg Intravenous Q6H PRN Newman Pies, MD       oxyCODONE (Oxy IR/ROXICODONE) immediate release tablet 10 mg  10 mg Oral Q3H PRN Newman Pies, MD   10 mg at 06/20/22 0740   oxyCODONE (Oxy IR/ROXICODONE) immediate release tablet 5 mg  5 mg Oral Q3H PRN Newman Pies, MD       pantoprazole (PROTONIX) EC tablet 80 mg  80 mg Oral Q1200 Newman Pies, MD   80 mg at 06/20/22 1133   potassium chloride (KLOR-CON) CR tablet 10 mEq  10 mEq Oral Daily Newman Pies, MD   10 mEq at 06/20/22 0932   propranolol (INDERAL) tablet 80 mg  80 mg Oral BID Newman Pies, MD   80 mg at 06/20/22 0932   sodium chloride flush (NS) 0.9 % injection 3 mL  3 mL Intravenous Q12H Newman Pies, MD   3 mL at 06/20/22 0939   sodium chloride flush (NS)  0.9 % injection 3 mL  3 mL Intravenous PRN Newman Pies, MD       sucralfate (CARAFATE) tablet 1 g  1 g Oral TID AC & HS Newman Pies, MD   1 g at 06/20/22 1127   tamsulosin (FLOMAX) capsule 0.4 mg  0.4 mg Oral QPC breakfast Newman Pies, MD   0.4 mg at 06/20/22 0740   torsemide  (DEMADEX) tablet 10 mg  10 mg Oral Daily Newman Pies, MD   10 mg at 06/20/22 0931   traZODone (DESYREL) tablet 75 mg  75 mg Oral QHS PRN Newman Pies, MD   75 mg at 06/20/22 0124   Vitamin E CAPS 400 Units  400 Units Oral Daily Newman Pies, MD   400 Units at 06/20/22 0933   zolpidem (AMBIEN) tablet 5 mg  5 mg Oral QHS PRN Newman Pies, MD         Discharge Medications: Please see discharge summary for a list of discharge medications.  Relevant Imaging Results:  Relevant Lab Results:   Additional Information SS# [place], LCSWA

## 2022-06-20 NOTE — Progress Notes (Signed)
Subjective:  The patient is alert and pleasant.  Her daughters at the bedside.  She looks better.   She tells me she wants to go to a skilled nursing facility to recover from her surgeries.  Objective: Vital signs in last 24 hours: Temp:  [97.6 F (36.4 C)-98.6 F (37 C)] 97.9 F (36.6 C) (10/26 1109) Pulse Rate:  [70-87] 72 (10/26 1109) Resp:  [13-24] 19 (10/26 1109) BP: (99-175)/(42-149) 144/65 (10/26 1109) SpO2:  [92 %-98 %] 98 % (10/26 1109) Weight:  [136.2 kg] 136.2 kg (10/25 2245) Estimated body mass index is 41.88 kg/m as calculated from the following:   Height as of this encounter: '5\' 11"'$  (1.803 m).   Weight as of this encounter: 136.2 kg.   Intake/Output from previous day: 10/25 0701 - 10/26 0700 In: 840 [P.O.:340; I.V.:500] Out: 2350 [Urine:2175; Drains:100; Blood:75] Intake/Output this shift: No intake/output data recorded.  Physical exam   The patient is alert and pleasant.  Her lower extremity strength is grossly normal.  Lab Results: Recent Labs    06/19/22 0931  WBC 8.3  HGB 10.1*  HCT 31.4*  PLT 129*   BMET Recent Labs    06/19/22 0931  NA 133*  K 3.0*  CL 101  CO2 22  GLUCOSE 129*  BUN 10  CREATININE 0.67  CALCIUM 8.9    Studies/Results: No results found.  Assessment/Plan:   Postop day 1. Status post evacuation of epidural hematoma:  The patient is doing better.  We will discontinue her Foley catheter.  We will start to arrange for skilled nursing facility placement.  I have answered all their questions.  LOS: 1 day     Ophelia Charter 06/20/2022, 1:51 PM

## 2022-06-20 NOTE — Consult Note (Signed)
   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  Of note, [facility] Care Management services does not replace or interfere with any services that are arranged by inpatient case management or social work.  For additional questions or referrals please contact:    Plan: Will sign off.   Natividad Brood, RN BSN [place]  [phone] business mobile phone Toll free office [phone]  *[place]  [phone] Fax number: [phone] [email or web address]

## 2022-06-20 NOTE — Evaluation (Signed)
Occupational Therapy Evaluation Patient Details Name: GARRY BOCHICCHIO MRN: [id number] DOB: 1956/03/30 Today's Date: 06/20/2022   History of Present Illness TAMETRIA AHO is a 66 yo female who underwent L3-4 decompression instrumentation fusion with myotomy's at L2-3 and L4-5 06/17/22 and was discharged to ALF then had 3 subsequent falls and bladder incontinence so returned and found to have hematoma and now s/p exploration of wound with hematoma evacuation on 06/19/22. PMHx: anxiety, COPD, DM, fibromyalgia, HLD, HTN, hyperthyroidism   Clinical Impression   PTA, pt received assistance with ADL and was living at ALF since previous spinal surgery on 06/17/2022. Upon eval, pt presenting with decreased mobility, strength, balance, and knowledge of how to maintain spinal precautions during ADL. Pt limited by pain this session. Pt with tangential thoughts and distracted by her pain. Pt requiring mod A +2 for transfers with [facility]. Providing education on brace application, oral care, bed mobility, and LB dressing. Pt verbalizing understanding but difficulty maintaining precautions during ADL. Will continue to follow acutely and recommending SNF for continued OT services to optimize independence and safety.      Recommendations for follow up therapy are one component of a multi-disciplinary discharge planning process, led by the attending physician.  Recommendations may be updated based on patient status, additional functional criteria and insurance authorization.   Follow Up Recommendations  Skilled nursing-short term rehab (<3 hours/day)    Assistance Recommended at Discharge Intermittent Supervision/Assistance  Patient can return home with the following A little help with walking and/or transfers;Assistance with cooking/housework;Assist for transportation;Help with stairs or ramp for entrance;A lot of help with bathing/dressing/bathroom    Functional Status Assessment  Patient has had a recent  decline in their functional status and demonstrates the ability to make significant improvements in function in a reasonable and predictable amount of time.  Equipment Recommendations  Other (comment) (defer to next venue of care)    Recommendations for Other Services       Precautions / Restrictions Precautions Precautions: Fall;Back Precaution Booklet Issued: No Precaution Comments: patient able to state back precautions, daughter brought brace from ALF Required Braces or Orthoses: Spinal Brace Spinal Brace: Lumbar corset;Applied in sitting position Restrictions Weight Bearing Restrictions: No      Mobility Bed Mobility Overal bed mobility: Needs Assistance Bed Mobility: Sit to Sidelying, Rolling Rolling: Min assist       Sit to sidelying: Max assist General bed mobility comments: assist for BLE into bed and to guide trunk to avoid breaking precautions    Transfers Overall transfer level: Needs assistance Equipment used: Ambulation equipment used (stedy) Transfers: Sit to/from Stand, Bed to chair/wheelchair/BSC Sit to Stand: Mod assist, +2 physical assistance           General transfer comment: sit<>stand x2 with mod A +2 up from recliner and min A +2 up from stedy Transfer via Lift Equipment: Stedy    Balance Overall balance assessment: Needs assistance Sitting-balance support: Feet supported, No upper extremity supported Sitting balance-Leahy Scale: Fair     Standing balance support: Bilateral upper extremity supported, Reliant on assistive device for balance Standing balance-Leahy Scale: Poor Standing balance comment: Heavy reliance on UE support due to LE weakness                           ADL either performed or assessed with clinical judgement   ADL Overall ADL's : Needs assistance/impaired Eating/Feeding: Independent;Sitting Eating/Feeding Details (indicate cue type and reason): finishing lunch in  recliner on arrival Grooming:  Supervision/safety;Sitting   Upper Body Bathing: Set up;Sitting   Lower Body Bathing: Moderate assistance;+2 for physical assistance;+2 for safety/equipment;Sit to/from stand   Upper Body Dressing : Minimal assistance Upper Body Dressing Details (indicate cue type and reason): Min A for brace application Lower Body Dressing: Maximal assistance;+2 for physical assistance;+2 for safety/equipment;Sit to/from stand Lower Body Dressing Details (indicate cue type and reason): unable to reach feet and would be unable to pull up pants due to required BUE support to stand Toilet Transfer: Moderate assistance;+2 for physical assistance;+2 for safety/equipment (stedy) Toilet Transfer Details (indicate cue type and reason): simulated chair<>bed Toileting- Clothing Manipulation and Hygiene: +2 for physical assistance;Total assistance;Sit to/from stand Toileting - Clothing Manipulation Details (indicate cue type and reason): pain limited     Functional mobility during ADLs: Moderate assistance;+2 for physical assistance;+2 for safety/equipment (stedy) General ADL Comments: Pt with BLE weakness and pain     Vision Baseline Vision/History: 0 No visual deficits Vision Assessment?: No apparent visual deficits     Perception     Praxis      Pertinent Vitals/Pain Pain Assessment Pain Assessment: Faces Faces Pain Scale: Hurts whole lot Pain Location: back Pain Descriptors / Indicators: Discomfort, Guarding, Operative site guarding, Moaning, Grimacing Pain Intervention(s): Limited activity within patient's tolerance, Monitored during session, Repositioned     Hand Dominance Right   Extremity/Trunk Assessment Upper Extremity Assessment Upper Extremity Assessment: Generalized weakness   Lower Extremity Assessment Lower Extremity Assessment: Defer to PT evaluation RLE Deficits / Details: AAROM with stiffness throughout and painful in back, strength grossly 3-/5 knee extension, and ankle DF LLE  Deficits / Details: AAROM with stiffness throughout and painful in back, strength grossly 3-/5 knee extension, and ankle DF   Cervical / Trunk Assessment Cervical / Trunk Assessment: Back Surgery   Communication Communication Communication: No difficulties   Cognition Arousal/Alertness: Awake/alert Behavior During Therapy: WFL for tasks assessed/performed Overall Cognitive Status: Within Functional Limits for tasks assessed                                 General Comments: 3/3 recall of precautions. Verbose and self-distracting throughout, impulsivity noted with fatigue     General Comments  daughter present and supportive    Exercises     Shoulder Instructions      Home Living Family/patient expects to be discharged to:: Assisted living                             Home Equipment: Rolling Walker (2 wheels);Shower seat;Grab bars - tub/shower;BSC/3in1;Adaptive equipment Adaptive Equipment: Reacher Additional Comments: no steps      Prior Functioning/Environment Prior Level of Function : Needs assist       Physical Assist : Mobility (physical);ADLs (physical)     Mobility Comments: having difficulty with walking and transfers at ALF since surgery with LE weakness ADLs Comments: Staff assists with LB ADLs and IADLs        OT Problem List: Decreased strength;Decreased range of motion;Decreased activity tolerance;Decreased knowledge of use of DME or AE;Decreased knowledge of precautions;Pain;Decreased safety awareness;Obesity      OT Treatment/Interventions:      OT Goals(Current goals can be found in the care plan section) Acute Rehab OT Goals Patient Stated Goal: no pain OT Goal Formulation: With patient Time For Goal Achievement: 07/04/22 Potential to Achieve Goals: Good  OT  Frequency:      Co-evaluation              AM-PAC OT "6 Clicks" Daily Activity     Outcome Measure Help from another person eating meals?: None Help from  another person taking care of personal grooming?: A Little Help from another person toileting, which includes using toliet, bedpan, or urinal?: A Little Help from another person bathing (including washing, rinsing, drying)?: A Lot Help from another person to put on and taking off regular upper body clothing?: A Little Help from another person to put on and taking off regular lower body clothing?: A Lot 6 Click Score: 17   End of Session Equipment Utilized During Treatment: Back brace;Other (comment) (stedy) Nurse Communication: Mobility status  Activity Tolerance: Patient tolerated treatment well Patient left: in bed;with call bell/phone within reach;with bed alarm set;with family/visitor present  OT Visit Diagnosis: Unsteadiness on feet (R26.81);Other abnormalities of gait and mobility (R26.89);Muscle weakness (generalized) (M62.81);Pain Pain - part of body:  (back)                Time: 5208-0223 OT Time Calculation (min): 34 min Charges:  OT General Charges $OT Visit: 1 Visit OT Evaluation $OT Eval Moderate Complexity: 1 Mod OT Treatments $Self Care/Home Management : 8-22 mins  Shanda Howells, OTR/L [facility] Acute Rehabilitation Office: [phone]   Lula Olszewski 06/20/2022, 2:29 PM

## 2022-06-20 NOTE — Progress Notes (Signed)
Received patient from PACU, in bed alert and oriented X4 with daughter at bedside. Honeycomb dressing at the back intact and clean. Hemovac in place draining bloody output. Placed bed at lowest position, bed alarms on, call light within reach.

## 2022-06-20 NOTE — TOC Initial Note (Addendum)
Transition of Care [facility]) - Initial/Assessment Note    Patient Details  Name: Christine Higgins MRN: [id number] Date of Birth: 05/15/1956  Transition of Care [facility]) CM/SW Contact:    Coralee Pesa, [place] Phone Number: 06/20/2022, 12:35 PM  Clinical Narrative:                 CSW notified by RN that pt and daughter have concerns about the ALF pt currently resides at. CSW met with pt and daughter at bedside. They advised that pt is from the [place] [facility] [place] and the care there was not meeting their needs. Daughter reports pt has had many falls, her medication has not been supplied consistently, and that staff is not helpful with pt. Daughter has had to assist pt with many things, as the ALF does not. CSW left a VM for Dustin, Clinical biochemist.  Next steps were discussed. PT has recommended SNF and family is agreeable. They mentioned [facility] and [facility] as their top choices, [employer]. Gov information provided. Levels of care were discussed and education was provided on insurance and discharge needs.  CSW to fax out referrals and follow up with top two facilities and request pt be reviewed. TOC will continue to follow for further needs.  1:50 CSW spoke with Rachel Bo, Mudlogger of the [facility]. CSW advised him of pt and families concerns. Rachel Bo states they were unaware of pt returning last time and did not have a plan of care. He states pt was more rehab appropriate. Rachel Bo states pt will continue to pay for room as long as it is being held, at the same rate she is paying now. CSW will update family.  Expected Discharge Plan: Skilled Nursing Facility Barriers to Discharge: Continued Medical Work up, SNF Pending bed offer, Insurance Authorization   Patient Goals and CMS Choice Patient states their goals for this hospitalization and ongoing recovery are:: Pt stating she wants to gain some independence. CMS Medicare.gov Compare Post Acute Care list provided to:: Patient Choice offered to / list  presented to : Patient, Adult Children  Expected Discharge Plan and Services Expected Discharge Plan: [place] Choice: [place] arrangements for the past 2 months: [place]                                      Prior Living Arrangements/Services Living arrangements for the past 2 months: [place] Lives with:: Facility Resident Patient language and need for interpreter reviewed:: Yes Do you feel safe going back to the place where you live?: No   ALF not meeting needs.  Need for Family Participation in Patient Care: Yes (Comment) Care giver support system in place?: Yes (comment)   Criminal Activity/Legal Involvement Pertinent to Current Situation/Hospitalization: No - Comment as needed  Activities of Daily Living      Permission Sought/Granted Permission sought to share information with : Family Supports, Chartered certified accountant granted to share information with : Yes, Verbal Permission Granted  Share Information with NAME: Traci Sermon  Permission granted to share info w AGENCY: [employer] granted to share info w Relationship: Daughter     Emotional Assessment Appearance:: Appears stated age Attitude/Demeanor/Rapport: Engaged Affect (typically observed): Appropriate Orientation: : Oriented to Self, Oriented to Place, Oriented to  Time, Oriented to Situation Alcohol / Substance Use: Not Applicable  Psych Involvement: No (comment)  Admission diagnosis:  Urinary retention [R33.9] Post-operative pain [G89.18] Back pain with history of spinal surgery [M54.9, Z98.890] Epidural hematoma (HCC) [S06.4XAA] Patient Active Problem List   Diagnosis Date Noted   Epidural hematoma ([place]) 06/19/2022   Spondylolisthesis of lumbar region 06/17/2022   Primary osteoarthritis of left hip 09/12/2021   Arthritis of sacroiliac joint of both sides 04/24/2021   Failed  spinal cord stimulator ([place]) 04/24/2021   Total knee replacement status 02/05/2021   Piriformis syndrome of both sides 01/02/2021   Chronic pain 08/07/2020   Chronic midline thoracic back pain 03/27/2020   Psoriasis 12/22/2019   Psoriatic arthritis ([place]) 12/22/2019   Multiple thyroid nodules 10/01/2019   Urticaria, chronic 07/14/2019   History of colon polyps 07/09/2019   Status post reverse arthroplasty of shoulder, left 06/24/2019   History of lumbar laminectomy for spinal cord decompression (L3-L4, 2015) 02/25/2019   Lumbar radiculopathy 02/25/2019   Neutropenia ([place]) 02/16/2019   Primary osteoarthritis involving multiple joints 02/16/2019   Polyarthralgia 02/02/2019   Cervical spondylosis 09/14/2018   Tachycardia 08/18/2017   Status post total left knee replacement 08/11/2017   Chronic pain syndrome 07/30/2017   Fibromyalgia 07/30/2017   Pelvic pain in female 06/24/2017   Dysuria 06/24/2017   Generalized anxiety disorder 06/23/2017   Primary insomnia 06/23/2017   Lumbar spondylosis 06/23/2017   Stenosis of intervertebral foramina 06/23/2017   Right carpal tunnel syndrome 03/27/2017   HTN, goal below 140/90 03/05/2017   Graves' orbitopathy 02/17/2017   Primary osteoarthritis of right knee 10/18/2016   Bilateral arm weakness 07/16/2016   Spinal stenosis of lumbar region 03/22/2016   Back pain at L4-L5 level 02/13/2016   Depression, major, in remission ([place]) 12/21/2015   Failed back surgical syndrome 10/30/2015   DDD (degenerative disc disease), lumbosacral 10/30/2015   Status post total replacement of right hip 08/21/2015   Leg pain 07/28/2015   Health care maintenance 06/21/2015   Impingement syndrome of shoulder, left 10/20/2014   Severe obesity (BMI 35.0-35.9 with comorbidity) ([place]) 08/09/2014   Atherosclerosis of abdominal aorta ([place]) 07/29/2014   DM II (diabetes mellitus, type II), controlled ([place]) 04/19/2014   Hemorrhoids 03/14/2014   Osteoporosis 02/03/2014    Migraines 02/03/2014   Hyperlipidemia, unspecified 02/03/2014   GERD (gastroesophageal reflux disease) 02/03/2014   PCP:  Kirk Ruths, MD Pharmacy:   [id number]: [phone]: [phone]    Social Determinants of Health (SDOH) Interventions    Readmission Risk Interventions     No data to display

## 2022-06-21 MED ORDER — VANCOMYCIN HCL IN DEXTROSE 1-5 GM/200ML-% IV SOLN
1000.0000 mg | Freq: Two times a day (BID) | INTRAVENOUS | Status: DC
  Administered 2022-06-21 – 2022-06-23 (×5): 1000 mg via INTRAVENOUS
  Filled 2022-06-21 (×6): qty 200

## 2022-06-21 NOTE — Anesthesia Postprocedure Evaluation (Signed)
Anesthesia Post Note  Patient: Christine Higgins  Procedure(s) Performed: EXPLORE LUMBAR WOUND FOR EVACUATION OF HEMATOMA     Patient location during evaluation: PACU Anesthesia Type: General Level of consciousness: awake and alert Pain management: pain level controlled Vital Signs Assessment: post-procedure vital signs reviewed and stable Respiratory status: spontaneous breathing, nonlabored ventilation, respiratory function stable and patient connected to nasal cannula oxygen Cardiovascular status: blood pressure returned to baseline and stable Postop Assessment: no apparent nausea or vomiting Anesthetic complications: yes   Encounter Notable Events  Notable Event Outcome Phase Comment  Difficult to intubate - expected  Intraprocedure Filed from anesthesia note documentation.    Last Vitals:  Vitals:   06/21/22 0000 06/21/22 0438  BP: 137/68 (!) 122/58  Pulse: 75 73  Resp: 20 16  Temp: 37.3 C 36.9 C  SpO2: 97% 97%    Last Pain:  Vitals:   06/21/22 0622  TempSrc:   PainSc: Asleep                 March Rummage Mohd Clemons

## 2022-06-21 NOTE — Care Management Important Message (Signed)
Important Message  Patient Details  Name: Christine Higgins MRN: [id number] Date of Birth: Mar 25, 1956   Medicare Important Message Given:  Yes     Hannah Beat 06/21/2022, 2:52 PM

## 2022-06-21 NOTE — Progress Notes (Signed)
Rounded on patient and her hemovac had completely come out and was sitting on the bedside table. Dressing has been applied to that area, clean dry and intact.  This nurse has called Dr. Newman Pies office and left a voicemail to have a call back to let provider know.  Patient denies any pain or discomfort at this time.  Will continue to monitor.

## 2022-06-21 NOTE — Progress Notes (Signed)
[place] dressing changed to lower back.  Patient up to the bathroom with brace and doing well.  Will continue to monitor.

## 2022-06-21 NOTE — TOC Progression Note (Signed)
Transition of Care [facility]) - Progression Note    Patient Details  Name: Christine Higgins MRN: [id number] Date of Birth: 20-Aug-1956  Transition of Care [facility]) CM/SW [place], [place] Phone Number: 06/21/2022, 2:24 PM  Clinical Narrative:     CSW followed up with [facility] who had put considering for acceptance. Leslie at [facility] states that since there is a possibility of pt remaining for LTC, they have to review the referral more extensively and speak with their business office. Pt has a bed offer at [facility], but they do not offer LTC options. CSW attempted to update dtr, left VM.   4:30  CSW updated that [facility] is unable to accept pt, [facility] states they can, but have to clear an MSP from 2010. They stated pt may be able to discharge to [facility] on Monday. CSW will request TOC start authorization over the weekend. Family in agreement. TOC will continue to follow for DC needs.  Expected Discharge Plan: [place] Barriers to Discharge: Continued Medical Work up, SNF Pending bed offer, Ship broker  Expected Discharge Plan and Services Expected Discharge Plan: [place] Choice: [place] arrangements for the past 2 months: Assisted Living Facility                                       Social Determinants of Health (SDOH) Interventions    Readmission Risk Interventions     No data to display

## 2022-06-21 NOTE — Progress Notes (Signed)
Subjective: The patient is alert and pleasant.  She looks and feels much better.  She has been urinating normally since removal of the Foley catheter.  She was able to stand yesterday with therapy.  Objective: Vital signs in last 24 hours: Temp:  [97.9 F (36.6 C)-99.2 F (37.3 C)] 98.4 F (36.9 C) (10/27 0438) Pulse Rate:  [72-81] 73 (10/27 0438) Resp:  [16-20] 16 (10/27 0438) BP: (122-144)/(58-73) 122/58 (10/27 0438) SpO2:  [97 %-99 %] 97 % (10/27 0438) Estimated body mass index is 41.88 kg/m as calculated from the following:   Height as of this encounter: '5\' 11"'$  (1.803 m).   Weight as of this encounter: 136.2 kg.   Intake/Output from previous day: 10/26 0701 - 10/27 0700 In: -  Out: 2407 [Urine:2300; Drains:107] Intake/Output this shift: Total I/O In: -  Out: 932 [Urine:900; Drains:32]  Physical exam the patient is alert and pleasant.  Her lower extremity strength is grossly normal.  Her Hemovac drain put out 100 cc yesterday.  Lab Results: Recent Labs    06/19/22 0931  WBC 8.3  HGB 10.1*  HCT 31.4*  PLT 129*   BMET Recent Labs    06/19/22 0931  NA 133*  K 3.0*  CL 101  CO2 22  GLUCOSE 129*  BUN 10  CREATININE 0.67  CALCIUM 8.9    Studies/Results: No results found.  Assessment/Plan: Postop day #2, status post evacuation of lumbar epidural hematoma: The patient is doing much better.  Her Hemovac put out 100 cc yesterday so we will continue it until tomorrow morning.  We are awaiting skilled nursing facility placement.  LOS: 2 days     Ophelia Charter 06/21/2022, 6:45 AM

## 2022-06-21 NOTE — Plan of Care (Signed)
Pt alert and oriented x 4. Up with 1 assist with walker to bsc. Only 7 ml from hemovac. Old drainage to dsg (Per previous RN pt had clot in hemovac which caused dressing to have drainage). Pt received 1 dose of oxy, 1 dose of morphine and 1 dose of robaxin my shift. Vitals stable afebrile.  Problem: Education: Goal: Ability to verbalize activity precautions or restrictions will improve Outcome: Progressing Goal: Knowledge of the prescribed therapeutic regimen will improve Outcome: Progressing Goal: Understanding of discharge needs will improve Outcome: Progressing   Problem: Activity: Goal: Ability to avoid complications of mobility impairment will improve Outcome: Progressing Goal: Ability to tolerate increased activity will improve Outcome: Progressing Goal: Will remain free from falls Outcome: Progressing   Problem: Bowel/Gastric: Goal: Gastrointestinal status for postoperative course will improve Outcome: Progressing   Problem: Clinical Measurements: Goal: Ability to maintain clinical measurements within normal limits will improve Outcome: Progressing Goal: Postoperative complications will be avoided or minimized Outcome: Progressing Goal: Diagnostic test results will improve Outcome: Progressing   Problem: Pain Management: Goal: Pain level will decrease Outcome: Progressing   Problem: Skin Integrity: Goal: Will show signs of wound healing Outcome: Progressing   Problem: Health Behavior/Discharge Planning: Goal: Identification of resources available to assist in meeting health care needs will improve Outcome: Progressing   Problem: Bladder/Genitourinary: Goal: Urinary functional status for postoperative course will improve Outcome: Progressing   Problem: Education: Goal: Knowledge of General Education information will improve Description: Including pain rating scale, medication(s)/side effects and non-pharmacologic comfort measures Outcome: Progressing   Problem:  Health Behavior/Discharge Planning: Goal: Ability to manage health-related needs will improve Outcome: Progressing   Problem: Clinical Measurements: Goal: Ability to maintain clinical measurements within normal limits will improve Outcome: Progressing Goal: Will remain free from infection Outcome: Progressing Goal: Diagnostic test results will improve Outcome: Progressing Goal: Respiratory complications will improve Outcome: Progressing Goal: Cardiovascular complication will be avoided Outcome: Progressing   Problem: Activity: Goal: Risk for activity intolerance will decrease Outcome: Progressing   Problem: Nutrition: Goal: Adequate nutrition will be maintained Outcome: Progressing   Problem: Coping: Goal: Level of anxiety will decrease Outcome: Progressing   Problem: Elimination: Goal: Will not experience complications related to bowel motility Outcome: Progressing Goal: Will not experience complications related to urinary retention Outcome: Progressing   Problem: Pain Managment: Goal: General experience of comfort will improve Outcome: Progressing   Problem: Safety: Goal: Ability to remain free from injury will improve Outcome: Progressing   Problem: Skin Integrity: Goal: Risk for impaired skin integrity will decrease Outcome: Progressing

## 2022-06-21 NOTE — Progress Notes (Signed)
Pharmacy Antibiotic Note  Christine Higgins is a 66 y.o. female admitted on 06/19/2022 now POD#2 s/p evacuation of lumbar epidural hematoma with plan to leave hemovac at least another 24h  Pharmacy has been consulted for vancomycin dosing.  Plan: Vancomycin '1000mg'$  IV  Q12H, d/c when hemovac d/c'd.  Height: '5\' 11"'$  (180.3 cm) Weight: (!) 136.2 kg (300 lb 4.3 oz) IBW/kg (Calculated) : 70.8  Temp (24hrs), Avg:98.6 F (37 C), Min:97.9 F (36.6 C), Max:99.2 F (37.3 C)  Recent Labs  Lab 06/19/22 0931  WBC 8.3  CREATININE 0.67    Estimated Creatinine Clearance: 104.5 mL/min (by C-G formula based on SCr of 0.67 mg/dL).    Allergies  Allergen Reactions   Cephalexin Hives   Ibuprofen Nausea And Vomiting    Flu like symptoms   Potassium Chloride Itching   Shellfish Allergy Nausea And Vomiting   Aspirin Nausea And Vomiting    Thank you for allowing pharmacy to be a part of this patient's care.  Wynona Neat, PharmD, BCPS  06/21/2022 6:57 AM

## 2022-06-21 NOTE — Progress Notes (Signed)
Physical Therapy Treatment Patient Details Name: Christine Higgins MRN: [id number] DOB: 21-Dec-1955 Today's Date: 06/21/2022   History of Present Illness Christine Higgins is a 66 yo female who underwent L3-4 decompression instrumentation fusion with myotomy's at L2-3 and L4-5 06/17/22 and was discharged to ALF then had 3 subsequent falls and bladder incontinence so returned and found to have hematoma and now s/p exploration of wound with hematoma evacuation on 06/19/22. PMHx: anxiety, COPD, DM, fibromyalgia, HLD, HTN, hyperthyroidism    PT Comments    Pt received seated EOB pleasant and agreeable to session with good progress towards PT goals. Pt able to come to stand from elevated EOB with light min assist, pt needing up to mod assist to rise from low recliner with cues for anterior weight shift. Pt able to progress gait training this session with RW with pt requiring min guard assist and chair follow for safety as pt continues to fatigue quickly and distance limited by increased pain with activity. Pt able to recall all back precautions and demonstrate good adherence throughout session. Pt continues to benefit from skilled PT services to progress toward functional mobility goals.    Recommendations for follow up therapy are one component of a multi-disciplinary discharge planning process, led by the attending physician.  Recommendations may be updated based on patient status, additional functional criteria and insurance authorization.  Follow Up Recommendations  Skilled nursing-short term rehab (<3 hours/day) Can patient physically be transported by private vehicle: No   Assistance Recommended at Discharge Frequent or constant Supervision/Assistance  Patient can return home with the following Two people to help with walking and/or transfers;Two people to help with bathing/dressing/bathroom;Help with stairs or ramp for entrance;Assist for transportation;Assistance with cooking/housework   Equipment  Recommendations  None recommended by PT    Recommendations for Other Services       Precautions / Restrictions Precautions Precautions: Fall;Back Precaution Booklet Issued: No Precaution Comments: patient able to state all back precautions, daughter brought brace from ALF Required Braces or Orthoses: Spinal Brace Spinal Brace: Lumbar corset;Applied in sitting position Restrictions Weight Bearing Restrictions: No     Mobility  Bed Mobility Overal bed mobility: Needs Assistance             General bed mobility comments: pt seated EOB on arrival    Transfers Overall transfer level: Needs assistance   Transfers: Sit to/from Stand, Bed to chair/wheelchair/BSC Sit to Stand: Min assist, Mod assist, From elevated surface           General transfer comment: light min assist to steady on rise from eleavted EOB, mod assist to power up from low recliner cues for anterior weight shift while maintaining back precatutions    Ambulation/Gait Ambulation/Gait assistance: Min guard, Supervision Gait Distance (Feet): 30 Feet (+ 15' -with chair follow) Assistive device: Rolling walker (2 wheels) Gait Pattern/deviations: Step-through pattern, Decreased stride length, Trunk flexed Gait velocity: Decreased     General Gait Details: slow steady gait with RW x1 seated recovery secondary to fatigue, cues for RW proximity as pt overcompensating and too close to front bar, light cues for posture with good carry through   [employer] Mobility    Modified Rankin (Stroke Patients Only)       Balance Overall balance assessment: Needs assistance Sitting-balance support: Feet supported, No upper extremity supported Sitting balance-Leahy Scale: Fair     Standing balance support: Bilateral upper extremity supported, Reliant on  assistive device for balance Standing balance-Leahy Scale: Poor Standing balance comment: heavy reliance og BUE on RW                             Cognition Arousal/Alertness: Awake/alert Behavior During Therapy: WFL for tasks assessed/performed Overall Cognitive Status: Within Functional Limits for tasks assessed                                 General Comments: 3/3 recall of precautions.        Exercises      General Comments General comments (skin integrity, edema, etc.): pt motivated to progress      Pertinent Vitals/Pain Pain Assessment Pain Assessment: Faces Faces Pain Scale: Hurts little more Pain Location: back Pain Descriptors / Indicators: Discomfort, Guarding, Operative site guarding, Moaning, Grimacing Pain Intervention(s): Monitored during session, Limited activity within patient's tolerance, Repositioned    Home Living                          Prior Function            PT Goals (current goals can now be found in the care plan section) Acute Rehab PT Goals Patient Stated Goal: to be able to walk to BR PT Goal Formulation: With patient/family Time For Goal Achievement: 07/04/22    Frequency    Min 3X/week      PT Plan      Co-evaluation              AM-PAC PT "6 Clicks" Mobility   Outcome Measure  Help needed turning from your back to your side while in a flat bed without using bedrails?: A Lot Help needed moving from lying on your back to sitting on the side of a flat bed without using bedrails?: A Lot Help needed moving to and from a bed to a chair (including a wheelchair)?: A Lot Help needed standing up from a chair using your arms (e.g., wheelchair or bedside chair)?: A Lot Help needed to walk in hospital room?: A Little Help needed climbing 3-5 steps with a railing? : A Lot 6 Click Score: 13    End of Session Equipment Utilized During Treatment: Gait belt;Back brace Activity Tolerance: Patient tolerated treatment well Patient left: in chair;with call bell/phone within reach;with family/visitor present Nurse Communication:  Mobility status PT Visit Diagnosis: Other abnormalities of gait and mobility (R26.89);Difficulty in walking, not elsewhere classified (R26.2);Pain Pain - Right/Left:  (both) Pain - part of body: Leg (back)     Time: 4696-2952 PT Time Calculation (min) (ACUTE ONLY): 27 min  Charges:  $Gait Training: 23-37 mins                    Brooks Stotz R. PTA Acute Rehabilitation Services Office: [place] 06/21/2022, 12:48 PM

## 2022-06-22 NOTE — Progress Notes (Signed)
Subjective: Patient reports right-sided lower lumbar and gluteus medius pain. NAE ON  Objective: Vital signs in last 24 hours: Temp:  [98.3 F (36.8 C)-99.7 F (37.6 C)] 98.3 F (36.8 C) (10/28 1131) Pulse Rate:  [75-90] 81 (10/28 1131) Resp:  [16-18] 18 (10/28 1131) BP: (119-160)/(61-79) 137/70 (10/28 1131) SpO2:  [97 %-100 %] 99 % (10/28 1131)  Intake/Output from previous day: 10/27 0701 - 10/28 0700 In: 880 [P.O.:480; IV Piggyback:400] Out: 240  Intake/Output this shift: No intake/output data recorded.  Physical Exam: Patient is awake, A/O X 4, conversant, and in good spirits. Eyes open spontaneously. They are in NAD and VSS. Doing well. Speech is fluent and appropriate. MAEW with good strength. Sensation to light touch is intact. PERLA, EOMI. CNs grossly intact. Dressing is clean dry intact. Incision is well approximated with no drainage, erythema, or edema.      Lab Results: No results for input(s): "WBC", "HGB", "HCT", "PLT" in the last 72 hours. BMET No results for input(s): "NA", "K", "CL", "CO2", "GLUCOSE", "BUN", "CREATININE", "CALCIUM" in the last 72 hours.  Studies/Results: No results found.  Assessment/Plan: 66 y.o. female who is POD #3 s/p evacuation of lumbar epidural hematoma. She is recovering well. She has right lower lumbar pain that is moderately addressed with pain medications. She is ready to try and mobilize more. Instructed patient to call nursing staff for assistance when ready to mobilize. Awaiting SNF placement.   -Continue LSO brace when OOB.  -Continue working on pain control, utilize PRNs as needed -Encourage mobility and ambulation -SCDs -PT/OT       LOS: 3 days     Marvis Moeller, DNP, AGNP-C Neurosurgery Nurse Practitioner  [facility] [place]. [address] [address], [place], [place] [address] P: [phone]    F: [phone]  06/22/2022 11:59 AM

## 2022-06-23 NOTE — Progress Notes (Signed)
Subjective: Patient reports that her pain is slightly better today. NAE ON.   Objective: Vital signs in last 24 hours: Temp:  [97.7 F (36.5 C)-99.5 F (37.5 C)] 98.7 F (37.1 C) (10/29 0747) Pulse Rate:  [72-96] 72 (10/29 0747) Resp:  [17-19] 17 (10/29 0747) BP: (132-167)/(70-84) 135/70 (10/29 0747) SpO2:  [97 %-100 %] 97 % (10/29 0747)  Intake/Output from previous day: 10/28 0701 - 10/29 0700 In: 1760 [P.O.:1360; IV Piggyback:400] Out: 400 [Urine:400] Intake/Output this shift: No intake/output data recorded.  Physical Exam: Patient is awake, A/O X 4, conversant, and in good spirits. Eyes open spontaneously. They are in NAD and VSS. Doing well. Speech is fluent and appropriate. MAEW with good strength. Sensation to light touch is intact. PERLA, EOMI. CNs grossly intact. Dressing is clean dry intact. Incision is well approximated with no drainage, erythema, or edema.   Lab Results: No results for input(s): "WBC", "HGB", "HCT", "PLT" in the last 72 hours. BMET No results for input(s): "NA", "K", "CL", "CO2", "GLUCOSE", "BUN", "CREATININE", "CALCIUM" in the last 72 hours.  Studies/Results: No results found.  Assessment/Plan: 66 y.o. female who is POD #4 s/p evacuation of lumbar epidural hematoma. She is continuing to recover well. She has right lower lumbar pain that is improving and well controlled with analgesics. Awaiting SNF placement.     -Continue LSO brace when OOB.  -Continue working on pain control, utilize PRNs as needed -Encourage mobility and ambulation -SCDs -PT/OT    LOS: 4 days     Marvis Moeller, DNP, AGNP-C Neurosurgery Nurse Practitioner  [facility] [place]. [address] [place], [place], [place] [address] P: [phone]    F: [phone]  06/23/2022 10:37 AM

## 2022-06-23 NOTE — Progress Notes (Signed)
Kara Dies request submitted for [facility], ref # [id number]. SW will provide updates as available.   Wandra Feinstein, MSW, LCSW [phone] (coverage)

## 2022-06-23 NOTE — Progress Notes (Signed)
Pharmacy Antibiotic Note  Christine Higgins is a 66 y.o. female admitted on 06/19/2022 now POD#2 s/p evacuation of lumbar epidural hematoma with plan to leave hemovac at least another 24h  Pharmacy has been consulted for vancomycin dosing.  Hemovac was removed at some point I believe by the patient per the notes although not explicitly addressed. Verified with nurse that she no longer has the hemovac and I discontinued the vancomycin per the instructions in the initial consult.  Plan: Discontinued vancomycin.  Height: '5\' 11"'$  (180.3 cm) Weight: (!) 136.2 kg (300 lb 4.3 oz) IBW/kg (Calculated) : 70.8  Temp (24hrs), Avg:98.5 F (36.9 C), Min:97.7 F (36.5 C), Max:99.5 F (37.5 C)  Recent Labs  Lab 06/19/22 0931  WBC 8.3  CREATININE 0.67     Estimated Creatinine Clearance: 104.5 mL/min (by C-G formula based on SCr of 0.67 mg/dL).    Allergies  Allergen Reactions   Cephalexin Hives   Ibuprofen Nausea And Vomiting    Flu like symptoms   Potassium Chloride Itching   Shellfish Allergy Nausea And Vomiting   Aspirin Nausea And Vomiting    Thank you for allowing pharmacy to be a part of this patient's care.  Varney Daily, PharmD PGY2 Pharmacy Resident  Please check AMION for all [facility] pharmacy phone numbers After 10:00 PM call main pharmacy [phone]

## 2022-06-24 ENCOUNTER — Encounter (INDEPENDENT_AMBULATORY_CARE_PROVIDER_SITE_OTHER): Payer: Self-pay

## 2022-06-24 DIAGNOSIS — M7981 Nontraumatic hematoma of soft tissue: Secondary | ICD-10-CM | POA: Diagnosis not present

## 2022-06-24 DIAGNOSIS — I10 Essential (primary) hypertension: Secondary | ICD-10-CM | POA: Diagnosis not present

## 2022-06-24 DIAGNOSIS — M4320 Fusion of spine, site unspecified: Secondary | ICD-10-CM | POA: Diagnosis not present

## 2022-06-24 DIAGNOSIS — E441 Mild protein-calorie malnutrition: Secondary | ICD-10-CM | POA: Diagnosis not present

## 2022-06-24 DIAGNOSIS — M6281 Muscle weakness (generalized): Secondary | ICD-10-CM | POA: Diagnosis not present

## 2022-06-24 DIAGNOSIS — R531 Weakness: Secondary | ICD-10-CM | POA: Diagnosis not present

## 2022-06-24 DIAGNOSIS — R52 Pain, unspecified: Secondary | ICD-10-CM | POA: Diagnosis not present

## 2022-06-24 DIAGNOSIS — S064X0D Epidural hemorrhage without loss of consciousness, subsequent encounter: Secondary | ICD-10-CM | POA: Diagnosis not present

## 2022-06-24 DIAGNOSIS — Z7401 Bed confinement status: Secondary | ICD-10-CM | POA: Diagnosis not present

## 2022-06-24 DIAGNOSIS — E785 Hyperlipidemia, unspecified: Secondary | ICD-10-CM | POA: Diagnosis not present

## 2022-06-24 DIAGNOSIS — S32030D Wedge compression fracture of third lumbar vertebra, subsequent encounter for fracture with routine healing: Secondary | ICD-10-CM | POA: Diagnosis not present

## 2022-06-24 LAB — BASIC METABOLIC PANEL
Anion gap: 7 (ref 5–15)
BUN: <5 mg/dL — ABNORMAL LOW (ref 8–23)
CO2: 30 mmol/L (ref 22–32)
Calcium: 9.3 mg/dL (ref 8.9–10.3)
Chloride: 99 mmol/L (ref 98–111)
Creatinine, Ser: 0.62 mg/dL (ref 0.44–1.00)
GFR, Estimated: >60 mL/min (ref >60)
Glucose, Bld: 142 mg/dL — ABNORMAL HIGH (ref 70–99)
Potassium: 2.9 mmol/L — ABNORMAL LOW (ref 3.5–5.1)
Sodium: 136 mmol/L (ref 135–145)

## 2022-06-24 MED ORDER — OXYCODONE-ACETAMINOPHEN 5-325 MG PO TABS: 1.0000 | ORAL_TABLET | ORAL | 0 refills | Status: DC | PRN

## 2022-06-24 MED ORDER — METHOCARBAMOL 1000 MG PO TABS: 1000.0000 mg | ORAL_TABLET | Freq: Three times a day (TID) | ORAL | Status: AC

## 2022-06-24 MED ORDER — METHOCARBAMOL 500 MG PO TABS
1000.0000 mg | ORAL_TABLET | Freq: Three times a day (TID) | ORAL | Status: DC
  Administered 2022-06-24: 1000 mg via ORAL
  Filled 2022-06-24 (×2): qty 2

## 2022-06-24 NOTE — Progress Notes (Signed)
   Providing Compassionate, Quality Care - Together   Subjective: Patient reports persistent pain in her right lower back. She reports she takes methocarbamol three times a day at home. This is a PRN medication presently, but it doesn't appear Ms. Tolles has received a recent dose.  Objective: Vital signs in last 24 hours: Temp:  [97.7 F (36.5 C)-98.2 F (36.8 C)] 98 F (36.7 C) (10/30 0745) Pulse Rate:  [66-88] 79 (10/30 1005) Resp:  [17-20] 18 (10/30 0745) BP: (112-149)/(59-73) 133/70 (10/30 1005) SpO2:  [98 %-100 %] 99 % (10/30 0745)  Intake/Output from previous day: 10/29 0701 - 10/30 0700 In: 1200 [P.O.:1200] Out: -  Intake/Output this shift: No intake/output data recorded.  Alert and oriented x 4 PERRLA CN II-XII grossly intact MAE, Strength and sensation intact Incision is covered with Honeycomb dressing and Steri Strips; Dressing is clean, dry, and intact   Lab Results: No results for input(s): "WBC", "HGB", "HCT", "PLT" in the last 72 hours. BMET Recent Labs    06/24/22 0403  NA 136  K 2.9*  CL 99  CO2 30  GLUCOSE 142*  BUN <5*  CREATININE 0.62  CALCIUM 9.3    Studies/Results: No results found.  Assessment/Plan: Patient underwent evacuation of a postoperative epidural hematoma by Dr. Arnoldo Morale on 06/19/2022. She is gradually improving and will likely discharge to a SNF for continued rehabilitation.   LOS: 5 days   -Changed methocarbamol to scheduled for now to see if we can better control patient's right lower back pain. -Continue to mobilize with therapies. Awaiting insurance approval for SNF placement.   Viona Gilmore, DNP, AGNP-C Nurse Practitioner  [facility] [address], [address], [place], [place] [address] P: [phone]    F: [phone]  06/24/2022, 11:04 AM

## 2022-06-24 NOTE — Plan of Care (Signed)
  Problem: Education: Goal: Ability to verbalize activity precautions or restrictions will improve Outcome: Completed/Met Goal: Knowledge of the prescribed therapeutic regimen will improve Outcome: Completed/Met Goal: Understanding of discharge needs will improve Outcome: Completed/Met   Problem: Activity: Goal: Ability to avoid complications of mobility impairment will improve Outcome: Completed/Met Goal: Ability to tolerate increased activity will improve Outcome: Completed/Met Goal: Will remain free from falls Outcome: Completed/Met

## 2022-06-24 NOTE — Discharge Instructions (Signed)
Wound Care Keep incision covered and dry for two days.    Do not put any creams, lotions, or ointments on incision. You are fine to shower. Let water run over incision and pat dry.  Activity Walk each and every day, increasing distance each day. No lifting greater than 5 lbs.  Avoid excessive back motion. No driving for 2 weeks; may ride as a passenger locally.  Diet Resume your normal diet.   Return to Work Will be discussed at your follow up appointment.  Call Your Doctor If Any of These Occur Redness, drainage, or swelling at the wound.  Temperature greater than 101 degrees. Severe pain not relieved by pain medication. Incision starts to come apart.  Follow Up Appt Call [phone] today for appointment in 2-3 weeks if you don't already have one or for any problems.  If you have any hardware placed in your spine, you will need an x-ray before your appointment.

## 2022-06-24 NOTE — Progress Notes (Signed)
Physical Therapy Treatment Patient Details Name: Christine Higgins MRN: [id number] DOB: May 31, 1956 Today's Date: 06/24/2022   History of Present Illness Christine Higgins is a 66 yo female who underwent L3-4 decompression instrumentation fusion with myotomy's at L2-3 and L4-5 06/17/22 and was discharged to ALF then had 3 subsequent falls and bladder incontinence so returned and found to have hematoma and now s/p exploration of wound with hematoma evacuation on 06/19/22. PMHx: anxiety, COPD, DM, fibromyalgia, HLD, HTN, hyperthyroidism    PT Comments    Pt received OOB on arrival and agreeable to session with continued progress toward acute goals. Pt noted to not be wearing brace on arrival, reinforced education on brace importance and wear when up OOB with pt verbalizing understanding and able to don with min assist. Pt able to demonstrate ambulation with rollator with min guard down to supervision with good self pacing and adherence to all back precautions with distance limited secondary to pain. Pt continues to benefit from skilled PT services to progress toward functional mobility goals.    Recommendations for follow up therapy are one component of a multi-disciplinary discharge planning process, led by the attending physician.  Recommendations may be updated based on patient status, additional functional criteria and insurance authorization.  Follow Up Recommendations  Skilled nursing-short term rehab (<3 hours/day) Can patient physically be transported by private vehicle: No   Assistance Recommended at Discharge Frequent or constant Supervision/Assistance  Patient can return home with the following Two people to help with walking and/or transfers;Two people to help with bathing/dressing/bathroom;Help with stairs or ramp for entrance;Assist for transportation;Assistance with cooking/housework   Equipment Recommendations  None recommended by PT    Recommendations for Other Services        Precautions / Restrictions Precautions Precautions: Fall;Back Precaution Booklet Issued: No Precaution Comments: patient able to state all back precautions, daughter brought brace from ALF Required Braces or Orthoses: Spinal Brace Spinal Brace: Lumbar corset;Applied in standing position Restrictions Weight Bearing Restrictions: No     Mobility  Bed Mobility Overal bed mobility: Needs Assistance             General bed mobility comments: pt OOB pre and post session    Transfers Overall transfer level: Needs assistance Equipment used: Rollator (4 wheels) Transfers: Sit to/from Stand, Bed to chair/wheelchair/BSC Sit to Stand: Min guard           General transfer comment: min guard to rise from rollator seat    Ambulation/Gait Ambulation/Gait assistance: Min guard, Supervision Gait Distance (Feet): 40 Feet (x2 with standing recovery leaning against wall) Assistive device: Rollator (4 wheels) Gait Pattern/deviations: Step-through pattern, Decreased stride length, Trunk flexed Gait velocity: Decreased     General Gait Details: slow guarded gait with rollator, pt with good self pacing, standing rest x1, distance limtied 2/2 pain   Stairs             Wheelchair Mobility    Modified Rankin (Stroke Patients Only)       Balance Overall balance assessment: Needs assistance Sitting-balance support: Feet supported, No upper extremity supported Sitting balance-Leahy Scale: Fair     Standing balance support: Bilateral upper extremity supported, Reliant on assistive device for balance Standing balance-Leahy Scale: Poor Standing balance comment: reliance on outside asssit                            Cognition Arousal/Alertness: Awake/alert Behavior During Therapy: [facility] for tasks assessed/performed Overall Cognitive  Status: Within Functional Limits for tasks assessed                                 General Comments: 3/3 recall of  precautions.        Exercises      General Comments General comments (skin integrity, edema, etc.): Pt seated on rollator at sink on arrival with back brace not on, reinforced importance of wear when up OOB, pt able to don with asssit      Pertinent Vitals/Pain Pain Assessment Pain Assessment: Faces Faces Pain Scale: Hurts even more Pain Location: back Pain Descriptors / Indicators: Discomfort, Guarding, Operative site guarding, Moaning, Grimacing Pain Intervention(s): Monitored during session, Limited activity within patient's tolerance    Home Living                          Prior Function            PT Goals (current goals can now be found in the care plan section) Acute Rehab PT Goals PT Goal Formulation: With patient/family Time For Goal Achievement: 07/04/22    Frequency    Min 3X/week      PT Plan      Co-evaluation              AM-PAC PT "6 Clicks" Mobility   Outcome Measure  Help needed turning from your back to your side while in a flat bed without using bedrails?: A Lot Help needed moving from lying on your back to sitting on the side of a flat bed without using bedrails?: A Lot Help needed moving to and from a bed to a chair (including a wheelchair)?: A Lot Help needed standing up from a chair using your arms (e.g., wheelchair or bedside chair)?: A Little Help needed to walk in hospital room?: A Little Help needed climbing 3-5 steps with a railing? : A Lot 6 Click Score: 14    End of Session Equipment Utilized During Treatment: Gait belt;Back brace Activity Tolerance: Patient tolerated treatment well Patient left: in chair;with call bell/phone within reach Nurse Communication: Mobility status PT Visit Diagnosis: Other abnormalities of gait and mobility (R26.89);Difficulty in walking, not elsewhere classified (R26.2);Pain Pain - Right/Left:  (both) Pain - part of body: Leg (back)     Time: 1540-0867 PT Time Calculation (min)  (ACUTE ONLY): 16 min  Charges:  $Therapeutic Exercise: 8-22 mins                    Cortlin Marano R. PTA Acute Rehabilitation Services Office: [place] 06/24/2022, 11:46 AM

## 2022-06-24 NOTE — TOC Transition Note (Signed)
Transition of Care [facility]) - CM/SW Discharge Note   Patient Details  Name: Christine Higgins MRN: [id number] Date of Birth: 12-19-1955  [facility] of [facility]) CM/SW Contact:  Coralee Pesa, [place] Phone Number: 06/24/2022, 11:56 AM   Clinical Narrative:    Pt to be transported to [facility] via [facility]. Nurse to call report to [phone]. Auth approved 10/30- 11/1 Navi ID [id number]   Final next level of care: [facility] Barriers to Discharge: Barriers Resolved   Patient Goals and CMS Choice Patient states their goals for this hospitalization and ongoing recovery are:: Pt stating she wants to gain some independence. CMS Medicare.gov Compare Post Acute Care list provided to:: Patient Choice offered to / list presented to : Patient, Adult Children  Discharge Placement              Patient chooses bed at: [facility]  Patient to be transferred to facility by: [place] Name of family member notified: Oneeka Patient and family notified of of transfer: 06/24/22  Discharge Plan and Services     Post Acute Care Choice: [place]                               Social Determinants of Health (SDOH) Interventions     Readmission Risk Interventions     No data to display

## 2022-06-24 NOTE — Discharge Summary (Signed)
Physician Discharge Summary     Providing Compassionate, Quality Care - Together   Patient ID: Christine Higgins MRN: [id number] DOB/AGE: 1955-09-19 66 y.o.  Admit date: 06/19/2022 Discharge date: 06/24/2022  Admission Diagnoses: Epidural hematoma  Discharge Diagnoses:  Principal Problem:   Epidural hematoma [facility])   Discharged Condition: good  Hospital Course: Patient underwent evacuation of a postoperative epidural hematoma by Dr. Arnoldo Morale on 06/19/2022. She underwent decompression and fusion of L3-4. She was admitted to [facility] following recovery from anesthesia in the PACU. Her postoperative course has been uncomplicated. She has worked with both physical and occupational therapies who feel the patient is ready for discharge to a skilled nursing facility for further rehabilitation. She is ambulating with assistance and the aid of a walker. She is tolerating a normal diet. She is not having any bowel or bladder dysfunction. Her pain is reasonably controlled with oral pain medication. She is ready for discharge to [facility].   Consults: PT, OT Care Management  Significant Diagnostic Studies: DG Lumbar Spine 2-3 Views  Result Date: 06/17/2022 CLINICAL DATA:  Surgical posterior fusion of L3-4. EXAM: LUMBAR SPINE - 2-3 VIEW; DG C-ARM 1-60 MIN-NO REPORT Radiation exposure index: 18.64 mGy. COMPARISON:  CT scan of May 15, 2022. FINDINGS: Two intraoperative fluoroscopic images were obtained of the lumbar spine. These demonstrate the patient to be status post surgical posterior fusion of L3-4 with bilateral intrapedicular screw placement and interbody fusion. IMPRESSION: Fluoroscopic guidance provided during lower lumbar surgery. Electronically Signed   By: Marijo Conception M.D.   On: 06/17/2022 12:08   DG C-Arm 1-60 Min-No Report  Result Date: 06/17/2022 Fluoroscopy was utilized by the requesting physician.  No radiographic interpretation.   DG Lumbar Spine 1  View  Result Date: 06/17/2022 CLINICAL DATA:  Intraoperative imaging for localization EXAM: LUMBAR SPINE - 1 VIEW COMPARISON:  CT lumbar spine dated 05/15/2022 FINDINGS: Surgical instruments overlie the level of L3. There is no evidence of lumbar spine fracture. Alignment is normal. Intervertebral disc spaces are maintained. IMPRESSION: Surgical instruments overlie the level of L3. Electronically Signed   By: Darrin Nipper M.D.   On: 06/17/2022 11:44     Treatments: surgery: Right L2-3, bilateral redo L3-4 and L4-5 laminotomy/foraminotomies/medial facetectomy to decompress the right L3 and bilateral L4 and L5 nerve roots(the work required to do this was in addition to the work required to do the posterior lumbar interbody fusion because of the patient's spinal stenosis, facet arthropathy. Etc. requiring a wide decompression of the nerve roots.);  Left L3-4 transforaminal lumbar interbody fusion with local morselized autograft bone and Zimmer DBM; insertion of interbody prosthesis at L3-4 (globus peek expandable interbody prosthesis); posterior nonsegmental instrumentation from L3 to L4 with globus titanium pedicle screws and rods; posterior lateral arthrodesis at L3-4 with local morselized autograft bone and Zimmer DBM.   Discharge Exam: Blood pressure 114/81, pulse 78, temperature 98.1 F (36.7 C), temperature source Oral, resp. rate 18, height '5\' 11"'$  (1.803 m), weight (!) 136.2 kg, SpO2 97 %.  Alert and oriented x 4 PERRLA CN II-XII grossly intact MAE, Strength and sensation intact Incision is covered with Honeycomb dressing and Steri Strips; Dressing is clean, dry, and intact  Disposition: Discharge disposition: 01-Home or Self Care       Discharge Instructions     Call MD for:  difficulty breathing, headache or visual disturbances   Complete by: As directed    Call MD for:  persistant nausea and vomiting  Complete by: As directed    Call MD for:  redness, tenderness, or signs of  infection (pain, swelling, redness, odor or green/yellow discharge around incision site)   Complete by: As directed    Call MD for:  severe uncontrolled pain   Complete by: As directed    Call MD for:  temperature >100.4   Complete by: As directed    Diet - low sodium heart healthy   Complete by: As directed    Increase activity slowly   Complete by: As directed    Remove dressing in 24 hours   Complete by: As directed       Allergies as of 06/24/2022       Reactions   Cephalexin Hives   Ibuprofen Nausea And Vomiting   Flu like symptoms   Potassium Chloride Itching   Shellfish Allergy Nausea And Vomiting   Aspirin Nausea And Vomiting        Medication List     STOP taking these medications    ketoconazole 2 % shampoo Commonly known as: NIZORAL   tamsulosin 0.4 MG Caps capsule Commonly known as: FLOMAX   traMADol 50 MG tablet Commonly known as: ULTRAM   triamcinolone cream 0.1 % Commonly known as: KENALOG       TAKE these medications    acetaminophen 500 MG tablet Commonly known as: TYLENOL Take 1,000 mg by mouth every 6 (six) hours as needed for mild pain or moderate pain. What changed: Another medication with the same name was removed. Continue taking this medication, and follow the directions you see here.   albuterol 108 (90 Base) MCG/ACT inhaler Commonly known as: VENTOLIN HFA Inhale 2 puffs into the lungs every 4 (four) hours as needed for shortness of breath or wheezing.   Antacid Liquid 818-299-37 MG/5ML suspension Generic drug: alum & mag hydroxide-simeth Take 30 mLs by mouth 4 (four) times daily as needed for indigestion or heartburn. Notify provider if symptoms persist for more than 3 days   atorvastatin 10 MG tablet Commonly known as: LIPITOR Take 10 mg by mouth daily after supper.   B-12 2500 MCG Tabs Take 5,000 mcg by mouth every other day.   BAZA PROTECT EX Apply 1 application  topically as needed (skin breakdown).   Benadryl Itch  Stopping cream Generic drug: diphenhydrAMINE-zinc acetate Apply 1 application  topically 3 (three) times daily as needed for itching.   budesonide-formoterol 160-4.5 MCG/ACT inhaler Commonly known as: SYMBICORT Inhale 2 puffs into the lungs 2 (two) times daily.   calcium carbonate 600 MG Tabs tablet Commonly known as: OS-CAL Take 600 mg by mouth every other day.   celecoxib 200 MG capsule Commonly known as: CELEBREX Take 200 mg by mouth 2 (two) times daily.   clonazePAM 0.5 MG tablet Commonly known as: KLONOPIN Take 0.5 mg by mouth at bedtime as needed for anxiety.   diphenhydrAMINE 25 mg capsule Commonly known as: BENADRYL Take 25 mg by mouth 4 (four) times daily as needed for itching or allergies. What changed: Another medication with the same name was removed. Continue taking this medication, and follow the directions you see here.   docusate sodium 100 MG capsule Commonly known as: COLACE Take 1 capsule (100 mg total) by mouth 2 (two) times daily.   doxycycline 100 MG tablet Commonly known as: VIBRA-TABS Take 1 tablet (100 mg total) by mouth every 12 (twelve) hours.   DULoxetine 60 MG capsule Commonly known as: CYMBALTA Take 60 mg by mouth daily.  DULoxetine 30 MG capsule Commonly known as: CYMBALTA Take 30 mg by mouth at bedtime.   esomeprazole 40 MG capsule Commonly known as: NEXIUM Take 40 mg by mouth 2 (two) times daily.   fluticasone 50 MCG/ACT nasal spray Commonly known as: FLONASE Place 2 sprays into both nostrils daily.   gabapentin 600 MG tablet Commonly known as: NEURONTIN Take 1,200 mg by mouth in the morning, at noon, and at bedtime.   Gold Bond Healing Lotn Apply 1 application  topically daily. May keep at bedside.   guaiFENesin 100 MG/5ML liquid Commonly known as: ROBITUSSIN Take 15 mLs by mouth every 6 (six) hours as needed for cough.   hydrOXYzine 10 MG tablet Commonly known as: ATARAX Take 10-30 mg by mouth at bedtime as needed for  itching.   ibandronate 150 MG tablet Commonly known as: BONIVA Take 150 mg by mouth every 30 (thirty) days.   levocetirizine 5 MG tablet Commonly known as: XYZAL Take 5 mg by mouth every evening.   linaclotide 290 MCG Caps capsule Commonly known as: LINZESS Take 290 mcg by mouth daily.   loperamide 2 MG tablet Commonly known as: IMODIUM A-D Take 4 mg by mouth 4 (four) times daily as needed for diarrhea or loose stools.   losartan 100 MG tablet Commonly known as: COZAAR Take 100 mg by mouth daily.   magnesium hydroxide 400 MG/5ML suspension Commonly known as: MILK OF MAGNESIA Take 30 mLs by mouth daily as needed (constipation).   menthol-zinc oxide powder Apply 1 application  topically daily as needed (irritation).   methimazole 5 MG tablet Commonly known as: TAPAZOLE Take 5 mg by mouth daily.   Methocarbamol 1000 MG Tabs Take 1,000 mg by mouth 3 (three) times daily. What changed:  medication strength how much to take when to take this reasons to take this   multivitamin with minerals Tabs tablet Take 1 tablet by mouth daily.   NEUTROGENA T/GEL EX Apply 1 application  topically daily as needed (dandruff).   OVER THE COUNTER MEDICATION Apply 1 application  topically See admin instructions. Barrier Cream. Apply topically after toileting as needed to prevent skin breakdown. May be kept in residents room.   oxyCODONE-acetaminophen 5-325 MG tablet Commonly known as: PERCOCET/ROXICET Take 1-2 tablets by mouth every 4 (four) hours as needed for moderate pain.   potassium chloride 10 MEQ tablet Commonly known as: KLOR-CON Take 10 mEq by mouth daily.   propranolol 80 MG tablet Commonly known as: INDERAL Take 80 mg by mouth 2 (two) times daily.   pyrithione zinc 1 % shampoo Commonly known as: HEAD AND SHOULDERS Apply 1 application  topically daily as needed for itching (dandruff).   Restasis 0.05 % ophthalmic emulsion Generic drug: cycloSPORINE Place 1 drop  into both eyes 2 (two) times daily.   sucralfate 1 g tablet Commonly known as: CARAFATE Take 1 g by mouth 4 (four) times daily -  before meals and at bedtime.   torsemide 10 MG tablet Commonly known as: DEMADEX Take 10 mg by mouth daily.   traZODone 150 MG tablet Commonly known as: DESYREL Take 75 mg by mouth at bedtime as needed for sleep.   Vitamin D3 Super Strength 50 MCG (2000 UT) Tabs Generic drug: Cholecalciferol Take 3,000 Units by mouth daily.   vitamin E 180 MG (400 UNITS) capsule Take 400 Units by mouth daily.        Contact information for follow-up providers     Newman Pies, MD Follow up.  Specialty: Neurosurgery Why: Need sutures removed at 10 days post op. Contact information: [address] [place] [address] [phone]              Contact information for after-discharge care     Destination     [facility] Preferred SNF .   Service: Skilled Nursing Contact information: [place] [phone]                     Signed: Viona Gilmore, DNP, AGNP-C Nurse Practitioner  [facility] [place] [address], [address], [place], [place] [address] P: [phone]    F: [phone]  06/24/2022, 12:25 PM

## 2022-06-25 DIAGNOSIS — E785 Hyperlipidemia, unspecified: Secondary | ICD-10-CM | POA: Diagnosis not present

## 2022-06-25 DIAGNOSIS — R52 Pain, unspecified: Secondary | ICD-10-CM | POA: Diagnosis not present

## 2022-06-25 DIAGNOSIS — S064X0D Epidural hemorrhage without loss of consciousness, subsequent encounter: Secondary | ICD-10-CM | POA: Diagnosis not present

## 2022-06-25 DIAGNOSIS — M4320 Fusion of spine, site unspecified: Secondary | ICD-10-CM | POA: Diagnosis not present

## 2022-06-28 DIAGNOSIS — M4320 Fusion of spine, site unspecified: Secondary | ICD-10-CM | POA: Diagnosis not present

## 2022-06-28 DIAGNOSIS — I10 Essential (primary) hypertension: Secondary | ICD-10-CM | POA: Diagnosis not present

## 2022-07-01 DIAGNOSIS — E441 Mild protein-calorie malnutrition: Secondary | ICD-10-CM | POA: Diagnosis not present

## 2022-07-01 DIAGNOSIS — R52 Pain, unspecified: Secondary | ICD-10-CM | POA: Diagnosis not present

## 2022-07-01 DIAGNOSIS — M4320 Fusion of spine, site unspecified: Secondary | ICD-10-CM | POA: Diagnosis not present

## 2022-07-04 ENCOUNTER — Ambulatory Visit (INDEPENDENT_AMBULATORY_CARE_PROVIDER_SITE_OTHER): Payer: Medicare HMO | Admitting: Nurse Practitioner

## 2022-07-04 MED FILL — Heparin Sodium (Porcine) Inj 1000 Unit/ML: INTRAMUSCULAR | Qty: 30 | Status: AC

## 2022-07-04 MED FILL — Sodium Chloride IV Soln 0.9%: INTRAVENOUS | Qty: 2000 | Status: AC

## 2022-07-05 DIAGNOSIS — E441 Mild protein-calorie malnutrition: Secondary | ICD-10-CM | POA: Diagnosis not present

## 2022-07-05 DIAGNOSIS — I10 Essential (primary) hypertension: Secondary | ICD-10-CM | POA: Diagnosis not present

## 2022-07-05 DIAGNOSIS — R52 Pain, unspecified: Secondary | ICD-10-CM | POA: Diagnosis not present

## 2022-07-05 DIAGNOSIS — M4320 Fusion of spine, site unspecified: Secondary | ICD-10-CM | POA: Diagnosis not present

## 2022-07-09 ENCOUNTER — Ambulatory Visit: Payer: Medicare HMO | Admitting: Physician Assistant

## 2022-07-15 DIAGNOSIS — M96840 Postprocedural hematoma of a musculoskeletal structure following a musculoskeletal system procedure: Secondary | ICD-10-CM | POA: Diagnosis not present

## 2022-07-15 DIAGNOSIS — I10 Essential (primary) hypertension: Secondary | ICD-10-CM | POA: Diagnosis not present

## 2022-07-15 DIAGNOSIS — K219 Gastro-esophageal reflux disease without esophagitis: Secondary | ICD-10-CM | POA: Diagnosis not present

## 2022-07-15 DIAGNOSIS — M4310 Spondylolisthesis, site unspecified: Secondary | ICD-10-CM | POA: Diagnosis not present

## 2022-07-15 DIAGNOSIS — G629 Polyneuropathy, unspecified: Secondary | ICD-10-CM | POA: Diagnosis not present

## 2022-07-15 DIAGNOSIS — E785 Hyperlipidemia, unspecified: Secondary | ICD-10-CM | POA: Diagnosis not present

## 2022-07-15 DIAGNOSIS — G4733 Obstructive sleep apnea (adult) (pediatric): Secondary | ICD-10-CM | POA: Diagnosis not present

## 2022-07-16 DIAGNOSIS — M4316 Spondylolisthesis, lumbar region: Secondary | ICD-10-CM | POA: Diagnosis not present

## 2022-07-22 ENCOUNTER — Other Ambulatory Visit
Admission: RE | Admit: 2022-07-22 | Discharge: 2022-07-22 | Disposition: A | Discharge status: Home / Self Care | Payer: Medicare HMO | Source: Ambulatory Visit | Attending: Internal Medicine | Admitting: Internal Medicine

## 2022-07-22 DIAGNOSIS — R600 Localized edema: Secondary | ICD-10-CM | POA: Diagnosis not present

## 2022-07-22 DIAGNOSIS — G894 Chronic pain syndrome: Secondary | ICD-10-CM | POA: Diagnosis not present

## 2022-07-22 DIAGNOSIS — E118 Type 2 diabetes mellitus with unspecified complications: Secondary | ICD-10-CM | POA: Diagnosis not present

## 2022-07-22 DIAGNOSIS — I10 Essential (primary) hypertension: Secondary | ICD-10-CM | POA: Diagnosis not present

## 2022-07-22 DIAGNOSIS — F325 Major depressive disorder, single episode, in full remission: Secondary | ICD-10-CM | POA: Diagnosis not present

## 2022-07-22 DIAGNOSIS — M48061 Spinal stenosis, lumbar region without neurogenic claudication: Secondary | ICD-10-CM | POA: Diagnosis not present

## 2022-07-22 LAB — D-DIMER, QUANTITATIVE: D-Dimer, Quant: 1.65 ug/mL-FEU — ABNORMAL HIGH (ref 0.00–0.50)

## 2022-07-23 ENCOUNTER — Other Ambulatory Visit: Payer: Self-pay | Admitting: Internal Medicine

## 2022-07-23 ENCOUNTER — Ambulatory Visit
Admission: RE | Admit: 2022-07-23 | Discharge: 2022-07-23 | Disposition: A | Discharge status: Home / Self Care | Payer: Medicare HMO | Source: Ambulatory Visit | Attending: Internal Medicine | Admitting: Internal Medicine

## 2022-07-23 DIAGNOSIS — R609 Edema, unspecified: Secondary | ICD-10-CM | POA: Diagnosis not present

## 2022-07-23 DIAGNOSIS — R7989 Other specified abnormal findings of blood chemistry: Secondary | ICD-10-CM | POA: Diagnosis not present

## 2022-07-23 DIAGNOSIS — R600 Localized edema: Secondary | ICD-10-CM | POA: Diagnosis not present

## 2022-07-25 ENCOUNTER — Ambulatory Visit (INDEPENDENT_AMBULATORY_CARE_PROVIDER_SITE_OTHER): Payer: Medicare HMO

## 2022-07-25 ENCOUNTER — Encounter (INDEPENDENT_AMBULATORY_CARE_PROVIDER_SITE_OTHER): Payer: Self-pay | Admitting: Nurse Practitioner

## 2022-07-25 ENCOUNTER — Ambulatory Visit (INDEPENDENT_AMBULATORY_CARE_PROVIDER_SITE_OTHER): Payer: Medicare HMO | Admitting: Nurse Practitioner

## 2022-07-25 VITALS — BP 170/82 | HR 79 | Resp 17 | Ht 71.0 in | Wt 276.0 lb

## 2022-07-25 DIAGNOSIS — M79605 Pain in left leg: Secondary | ICD-10-CM | POA: Diagnosis not present

## 2022-07-25 DIAGNOSIS — M79604 Pain in right leg: Secondary | ICD-10-CM

## 2022-07-25 DIAGNOSIS — M47818 Spondylosis without myelopathy or radiculopathy, sacral and sacrococcygeal region: Secondary | ICD-10-CM

## 2022-07-25 DIAGNOSIS — M7989 Other specified soft tissue disorders: Secondary | ICD-10-CM

## 2022-07-25 DIAGNOSIS — E118 Type 2 diabetes mellitus with unspecified complications: Secondary | ICD-10-CM | POA: Insufficient documentation

## 2022-07-25 DIAGNOSIS — I10 Essential (primary) hypertension: Secondary | ICD-10-CM

## 2022-08-01 ENCOUNTER — Ambulatory Visit: Payer: Medicare HMO | Admitting: Dermatology

## 2022-08-03 ENCOUNTER — Encounter (INDEPENDENT_AMBULATORY_CARE_PROVIDER_SITE_OTHER): Payer: Self-pay | Admitting: Nurse Practitioner

## 2022-08-03 NOTE — Progress Notes (Signed)
Subjective:    Patient ID: Christine Higgins, female    DOB: Mar 04, 1956, 66 y.o.   MRN: [id number] Chief Complaint  Patient presents with   Follow-up    ultrasound    The patient returns today for follow-up regarding her lower extremity pain and edema.  Since her last office visit the patient has had surgery on her back.  She notes that the pain in her legs is largely resolved.  She also notes that the swelling is improved as well.  She does continue to have some lower extremity edema.  The patient has also had some dizziness and concern for possible carotid disease.  The patient is a former smoker.  Today noninvasive studies show an ABI of 1.20 on the right and 1.29 on the left.  These are consistent with previous studies that were done in 2019.  Previous ABI was 1.18 on the right and 1.14 on the left.  The patient does have evidence of deep venous insufficiency bilaterally.  Is also reflux in the bilateral saphenofemoral junctions.  No evidence of DVT or superficial thrombophlebitis bilaterally.    Review of Systems  Cardiovascular:  Positive for leg swelling.  Musculoskeletal:  Positive for back pain.  All other systems reviewed and are negative.      Objective:   Physical Exam  BP (!) 170/82 (BP Location: Left Arm)   Pulse 79   Resp 17   Ht '5\' 11"'$  (1.803 m)   Wt 276 lb (125.2 kg)   BMI 38.49 kg/m   Past Medical History:  Diagnosis Date   Anxiety    Asthma    Breast mass    LEFT x 3 months per pt and palpated by physician   Cervical disc disease    Chronic pain syndrome    COPD (chronic obstructive pulmonary disease) (HCC)    Degenerative disc disease, lumbar    osteoarthritis   Depression    Diabetes mellitus without complication (HCC)    Fibromyalgia    Fibromyalgia    GERD (gastroesophageal reflux disease)    Glaucoma    Graves disease    Headache    migraines   Hemorrhoids    History of hiatal hernia    Hyperlipidemia    Hypertension    Hyperthyroidism     Lumbar disc disease    Obesity    Pre-diabetes    Psoriatic arthritis (HCC)    Thyroid disease     Social History   Socioeconomic History   Marital status: Single    Spouse name: Not on file   Number of children: Not on file   Years of education: Not on file   Highest education level: Not on file  Occupational History   Not on file  Tobacco Use   Smoking status: Former    Types: Cigarettes    Quit date: 60    Years since quitting: 29.9   Smokeless tobacco: Never  Vaping Use   Vaping Use: Never used  Substance and Sexual Activity   Alcohol use: No    Alcohol/week: 0.0 standard drinks of alcohol   Drug use: Not Currently    Comment: 1991   Sexual activity: Not Currently    Birth control/protection: None  Other Topics Concern   Not on file  Social History Narrative   Live in assisted living   Social Determinants of Health   Financial Resource Strain: Not on file  Food Insecurity: Not on file  Transportation Needs: Not on file  Physical Activity: Not on file  Stress: Not on file  Social Connections: Not on file  Intimate Partner Violence: Not on file    Past Surgical History:  Procedure Laterality Date   BACK SURGERY     sumbar   CARDIAC CATHETERIZATION  2006   carpel tunn Right    carpel tunnel Left    CATARACT EXTRACTION Bilateral 2022   CESAREAN SECTION     COLONOSCOPY     COLONOSCOPY WITH PROPOFOL N/A 10/20/2020   Procedure: COLONOSCOPY WITH PROPOFOL;  Surgeon: Lesly Rubenstein, MD;  Location: [facility];  Service: Endoscopy;  Laterality: N/A;   ESOPHAGOGASTRODUODENOSCOPY (EGD) WITH PROPOFOL N/A 06/26/2017   Procedure: ESOPHAGOGASTRODUODENOSCOPY (EGD) WITH PROPOFOL;  Surgeon: Lollie Sails, MD;  Location: [facility];  Service: Endoscopy;  Laterality: N/A;   EXCISION MORTON'S NEUROMA Left 02/05/2017   Procedure: EXCISION MORTON'S NEUROMA;  Surgeon: Samara Deist, DPM;  Location: [facility];  Service: Podiatry;  Laterality:  Left;  iva with local   FRACTURE SURGERY  1969   Left Elbow   HAND SURGERY Right    scar tissue removal   JOINT REPLACEMENT     right hip arthroplasty 08/25/15   KNEE ARTHROPLASTY Left 08/11/2017   Procedure: COMPUTER ASSISTED TOTAL KNEE ARTHROPLASTY;  Surgeon: Dereck Leep, MD;  Location: [facility];  Service: Orthopedics;  Laterality: Left;   KNEE ARTHROPLASTY Right 02/05/2021   Procedure: COMPUTER ASSISTED TOTAL KNEE ARTHROPLASTY;  Surgeon: Dereck Leep, MD;  Location: [facility];  Service: Orthopedics;  Laterality: Right;   KNEE ARTHROSCOPY Right 07/02/2018   Procedure: ARTHROSCOPY KNEE, Medial and Lateral  Chondroplasty;  Surgeon: Dereck Leep, MD;  Location: [facility];  Service: Orthopedics;  Laterality: Right;   LUMBAR LAMINECTOMY/DECOMPRESSION MICRODISCECTOMY N/A 06/19/2022   Procedure: EXPLORE LUMBAR WOUND FOR EVACUATION OF HEMATOMA;  Surgeon: Newman Pies, MD;  Location: [facility];  Service: Neurosurgery;  Laterality: N/A;   NECK SURGERY     "disk implant"   REVERSE SHOULDER ARTHROPLASTY Left 06/24/2019   Procedure: REVERSE SHOULDER ARTHROPLASTY;  Surgeon: Corky Mull, MD;  Location: [facility];  Service: Orthopedics;  Laterality: Left;   SHOULDER SURGERY Right    spur removal   SPINAL CORD STIMULATOR REMOVAL N/A 04/09/2021   Procedure: REMOVAL SPINAL CORD STIMULATOR & PULSE GENERATOR;  Surgeon: [name], MD;  Location: [facility];  Service: Neurosurgery;  Laterality: N/A;  2nd case   THORACIC LAMINECTOMY FOR SPINAL CORD STIMULATOR N/A 08/07/2020   Procedure: THORACIC SPINAL CORD STIMULATOR VIA LAMINECTOMY, PULSE GENERATOR;  Surgeon: [name], MD;  Location: [facility];  Service: Neurosurgery;  Laterality: N/A;   TOTAL HIP ARTHROPLASTY Right    TUBAL LIGATION      Family History  Problem Relation Age of Onset   Cancer Mother    Heart disease Father    Breast cancer Neg Hx     Allergies  Allergen Reactions   Cephalexin Hives   Ibuprofen Nausea And Vomiting     Flu like symptoms   Potassium Chloride Itching   Shellfish Allergy Nausea And Vomiting   Aspirin Nausea And Vomiting       Latest Ref Rng & Units 06/19/2022    9:31 AM 06/10/2022    2:00 PM 04/04/2021    2:22 PM  CBC  WBC 4.0 - 10.5 K/uL 8.3  2.6  3.8   Hemoglobin 12.0 - 15.0 g/dL 10.1  12.3  10.5   Hematocrit 36.0 - 46.0 % 31.4  37.7  33.4  Platelets 150 - 400 K/uL 129  184  219       CMP     Component Value Date/Time   NA 136 06/24/2022 0403   NA 137 04/27/2020 1338   NA 132 (L) 08/26/2014 0458   K 2.9 (L) 06/24/2022 0403   K 3.8 08/28/2014 0359   CL 99 06/24/2022 0403   CL 95 (L) 08/26/2014 0458   CO2 30 06/24/2022 0403   CO2 31 08/26/2014 0458   GLUCOSE 142 (H) 06/24/2022 0403   GLUCOSE 155 (H) 08/26/2014 0458   BUN <5 (L) 06/24/2022 0403   BUN 9 04/27/2020 1338   BUN 5 (L) 08/26/2014 0458   CREATININE 0.62 06/24/2022 0403   CREATININE 0.74 08/26/2014 0458   CALCIUM 9.3 06/24/2022 0403   CALCIUM 8.6 08/26/2014 0458   PROT 6.9 01/25/2021 1150   PROT 6.9 04/27/2020 1338   ALBUMIN 4.3 01/25/2021 1150   ALBUMIN 4.7 04/27/2020 1338   AST 21 01/25/2021 1150   ALT 20 01/25/2021 1150   ALKPHOS 81 01/25/2021 1150   BILITOT 0.7 01/25/2021 1150   BILITOT 0.8 04/27/2020 1338   GFRNONAA >60 06/24/2022 0403   GFRNONAA >60 08/26/2014 0458   GFRAA 83 04/27/2020 1338   GFRAA >60 08/26/2014 0458     No results found.     Assessment & Plan:   1. Pain in both lower extremities Recommend:  The patient has atypical pain symptoms for vascular disease and on exam I do not find evidence of vascular pathology that would explain the patient's symptoms.  Noninvasive studies do not identify significant vascular problems  Given that her pain has improved post surgery, I suspect that it largely had to do with her lower back.  The patient should continue walking and begin a more formal exercise program. The patient should continue his antiplatelet therapy and aggressive  treatment of the lipid abnormalities.  Patient will follow-up with me on a PRN basis.  2. Arthritis of sacroiliac joint of both sides Continue NSAID medications as already ordered, these medications have been reviewed and there are no changes at this time.  Continued activity and therapy was stressed.  3. Leg swelling Recommend:  I have had a long discussion with the patient regarding swelling and why it  causes symptoms.  Patient will begin wearing graduated compression on a daily basis a prescription was given. The patient will  wear the stockings first thing in the morning and removing them in the evening. The patient is instructed specifically not to sleep in the stockings.   In addition, behavioral modification will be initiated.  This will include frequent elevation, use of over the counter pain medications and exercise such as walking.  Consideration for a lymph pump will also be made based upon the effectiveness of conservative therapy.  This would help to improve the edema control and prevent sequela such as ulcers and infections     4. HTN, goal below 140/90 Continue antihypertensive medications as already ordered, these medications have been reviewed and there are no changes at this time.   Current Outpatient Medications on File Prior to Visit  Medication Sig Dispense Refill   acetaminophen (TYLENOL) 500 MG tablet Take 1,000 mg by mouth every 6 (six) hours as needed for mild pain or moderate pain.     albuterol (VENTOLIN HFA) 108 (90 Base) MCG/ACT inhaler Inhale 2 puffs into the lungs every 4 (four) hours as needed for shortness of breath or wheezing.     atorvastatin (  LIPITOR) 10 MG tablet Take 10 mg by mouth daily after supper.      budesonide-formoterol (SYMBICORT) 160-4.5 MCG/ACT inhaler Inhale 2 puffs into the lungs 2 (two) times daily.     calcium carbonate (OS-CAL) 600 MG TABS tablet Take 600 mg by mouth every other day.     celecoxib (CELEBREX) 200 MG capsule Take  200 mg by mouth 2 (two) times daily.     Cholecalciferol (VITAMIN D3 SUPER STRENGTH) 50 MCG (2000 UT) TABS Take 3,000 Units by mouth daily.     clonazePAM (KLONOPIN) 0.5 MG tablet Take 0.5 mg by mouth at bedtime as needed for anxiety.     Coal Tar Extract (NEUTROGENA T/GEL EX) Apply 1 application  topically daily as needed (dandruff).     Cyanocobalamin (B-12) 2500 MCG TABS Take 5,000 mcg by mouth every other day.     diphenhydrAMINE (BENADRYL) 25 mg capsule Take 25 mg by mouth 4 (four) times daily as needed for itching or allergies.     diphenhydrAMINE-zinc acetate (BENADRYL ITCH STOPPING) cream Apply 1 application  topically 3 (three) times daily as needed for itching.     docusate sodium (COLACE) 100 MG capsule Take 1 capsule (100 mg total) by mouth 2 (two) times daily. 30 capsule 0   doxycycline (VIBRA-TABS) 100 MG tablet Take 1 tablet (100 mg total) by mouth every 12 (twelve) hours. 20 tablet 0   DULoxetine (CYMBALTA) 30 MG capsule Take 30 mg by mouth at bedtime.     DULoxetine (CYMBALTA) 60 MG capsule Take 60 mg by mouth daily.     esomeprazole (NEXIUM) 40 MG capsule Take 40 mg by mouth 2 (two) times daily.      fluticasone (FLONASE) 50 MCG/ACT nasal spray Place 2 sprays into both nostrils daily.     gabapentin (NEURONTIN) 600 MG tablet Take 1,200 mg by mouth in the morning, at noon, and at bedtime.     hydrOXYzine (ATARAX) 10 MG tablet Take 10-30 mg by mouth at bedtime as needed for itching.     ibandronate (BONIVA) 150 MG tablet Take 150 mg by mouth every 30 (thirty) days.     linaclotide (LINZESS) 290 MCG CAPS capsule Take 290 mcg by mouth daily.     losartan (COZAAR) 100 MG tablet Take 100 mg by mouth daily.     methimazole (TAPAZOLE) 5 MG tablet Take 5 mg by mouth daily.     methocarbamol 1000 MG TABS Take 1,000 mg by mouth 3 (three) times daily.     Multiple Vitamin (MULTIVITAMIN WITH MINERALS) TABS tablet Take 1 tablet by mouth daily.     oxyCODONE-acetaminophen (PERCOCET/ROXICET)  5-325 MG tablet Take 1-2 tablets by mouth every 4 (four) hours as needed for moderate pain. 48 tablet 0   potassium chloride (KLOR-CON) 10 MEQ tablet Take 10 mEq by mouth daily.     propranolol (INDERAL) 80 MG tablet Take 80 mg by mouth 2 (two) times daily.     RESTASIS 0.05 % ophthalmic emulsion Place 1 drop into both eyes 2 (two) times daily.     sucralfate (CARAFATE) 1 g tablet Take 1 g by mouth 4 (four) times daily -  before meals and at bedtime.     torsemide (DEMADEX) 10 MG tablet Take 10 mg by mouth daily.     traZODone (DESYREL) 150 MG tablet Take 75 mg by mouth at bedtime as needed for sleep.      vitamin E 180 MG (400 UNITS) capsule Take 400 Units by mouth  daily.     alum & mag hydroxide-simeth (ANTACID LIQUID) 200-200-20 MG/5ML suspension Take 30 mLs by mouth 4 (four) times daily as needed for indigestion or heartburn. Notify provider if symptoms persist for more than 3 days (Patient not taking: Reported on 07/25/2022)     Emollient (GOLD BOND HEALING) LOTN Apply 1 application  topically daily. May keep at bedside. (Patient not taking: Reported on 07/25/2022)     guaiFENesin (ROBITUSSIN) 100 MG/5ML liquid Take 15 mLs by mouth every 6 (six) hours as needed for cough. (Patient not taking: Reported on 07/25/2022)     levocetirizine (XYZAL) 5 MG tablet Take 5 mg by mouth every evening. (Patient not taking: Reported on 07/25/2022)     loperamide (IMODIUM A-D) 2 MG tablet Take 4 mg by mouth 4 (four) times daily as needed for diarrhea or loose stools. (Patient not taking: Reported on 07/25/2022)     magnesium hydroxide (MILK OF MAGNESIA) 400 MG/5ML suspension Take 30 mLs by mouth daily as needed (constipation). (Patient not taking: Reported on 07/25/2022)     menthol-zinc oxide (GOLD BOND) powder Apply 1 application  topically daily as needed (irritation). (Patient not taking: Reported on 07/25/2022)     OVER THE COUNTER MEDICATION Apply 1 application  topically See admin instructions. Barrier  Cream. Apply topically after toileting as needed to prevent skin breakdown. May be kept in residents room. (Patient not taking: Reported on 07/25/2022)     pyrithione zinc (HEAD AND SHOULDERS) 1 % shampoo Apply 1 application  topically daily as needed for itching (dandruff). (Patient not taking: Reported on 07/25/2022)     Skin Protectants, Misc. (BAZA PROTECT EX) Apply 1 application  topically as needed (skin breakdown). (Patient not taking: Reported on 07/25/2022)     No current facility-administered medications on file prior to visit.    There are no Patient Instructions on file for this visit. No follow-ups on file.   Kris Hartmann, NP

## 2022-08-06 ENCOUNTER — Encounter: Payer: Self-pay | Admitting: Emergency Medicine

## 2022-08-06 ENCOUNTER — Emergency Department
Admission: EM | Admit: 2022-08-06 | Discharge: 2022-08-06 | Disposition: A | Discharge status: Home / Self Care | Payer: Medicare HMO | Attending: Emergency Medicine | Admitting: Emergency Medicine

## 2022-08-06 ENCOUNTER — Other Ambulatory Visit: Payer: Self-pay

## 2022-08-06 ENCOUNTER — Emergency Department: Payer: Medicare HMO

## 2022-08-06 DIAGNOSIS — M19011 Primary osteoarthritis, right shoulder: Secondary | ICD-10-CM | POA: Diagnosis not present

## 2022-08-06 DIAGNOSIS — R058 Other specified cough: Secondary | ICD-10-CM | POA: Diagnosis not present

## 2022-08-06 DIAGNOSIS — B349 Viral infection, unspecified: Secondary | ICD-10-CM | POA: Insufficient documentation

## 2022-08-06 DIAGNOSIS — I10 Essential (primary) hypertension: Secondary | ICD-10-CM | POA: Diagnosis not present

## 2022-08-06 DIAGNOSIS — R0602 Shortness of breath: Secondary | ICD-10-CM | POA: Diagnosis not present

## 2022-08-06 DIAGNOSIS — L4050 Arthropathic psoriasis, unspecified: Secondary | ICD-10-CM | POA: Diagnosis not present

## 2022-08-06 DIAGNOSIS — R059 Cough, unspecified: Secondary | ICD-10-CM | POA: Diagnosis not present

## 2022-08-06 DIAGNOSIS — M797 Fibromyalgia: Secondary | ICD-10-CM | POA: Diagnosis not present

## 2022-08-06 DIAGNOSIS — Z1152 Encounter for screening for COVID-19: Secondary | ICD-10-CM | POA: Insufficient documentation

## 2022-08-06 DIAGNOSIS — B338 Other specified viral diseases: Secondary | ICD-10-CM

## 2022-08-06 DIAGNOSIS — I700 Atherosclerosis of aorta: Secondary | ICD-10-CM | POA: Diagnosis not present

## 2022-08-06 DIAGNOSIS — G608 Other hereditary and idiopathic neuropathies: Secondary | ICD-10-CM | POA: Diagnosis not present

## 2022-08-06 DIAGNOSIS — B974 Respiratory syncytial virus as the cause of diseases classified elsewhere: Secondary | ICD-10-CM | POA: Insufficient documentation

## 2022-08-06 DIAGNOSIS — R52 Pain, unspecified: Secondary | ICD-10-CM | POA: Diagnosis not present

## 2022-08-06 DIAGNOSIS — J45909 Unspecified asthma, uncomplicated: Secondary | ICD-10-CM | POA: Diagnosis not present

## 2022-08-06 DIAGNOSIS — G5603 Carpal tunnel syndrome, bilateral upper limbs: Secondary | ICD-10-CM | POA: Diagnosis not present

## 2022-08-06 LAB — RESP PANEL BY RT-PCR (RSV, FLU A&B, COVID)  RVPGX2
Influenza A by PCR: NEGATIVE
Influenza B by PCR: NEGATIVE
Resp Syncytial Virus by PCR: POSITIVE — AB
SARS Coronavirus 2 by RT PCR: NEGATIVE

## 2022-08-06 MED ORDER — PREDNISONE 50 MG PO TABS: 50.0000 mg | ORAL_TABLET | Freq: Every day | ORAL | 0 refills | Status: DC

## 2022-08-06 NOTE — ED Triage Notes (Signed)
Pt via POV from home. Pt c/o cough and nasal congestion for the past couple of days. Pt has hx of asthma, denies any other chronic medical hx. Pt is A&OX4 and NAD

## 2022-08-06 NOTE — ED Provider Notes (Signed)
   [facility] Provider Note    Event Date/Time   First MD Initiated Contact with Patient 08/06/22 1633     (approximate)   History   Cough   HPI  Christine Higgins is a 66 y.o. female with history of asthma presents with complaints of cough, mild wheezing.  Reports she is has congestion as well.     Physical Exam   Triage Vital Signs: ED Triage Vitals  Enc Vitals Group     BP 08/06/22 1522 (!) 141/95     Pulse Rate 08/06/22 1522 79     Resp 08/06/22 1522 20     Temp 08/06/22 1522 98.1 F (36.7 C)     Temp src --      SpO2 08/06/22 1522 98 %     Weight 08/06/22 1517 97.5 kg (215 lb)     Height 08/06/22 1517 1.803 m ('5\' 11"'$ )     Head Circumference --      Peak Flow --      Pain Score 08/06/22 1517 7     Pain Loc --      Pain Edu? --      Excl. in [place]? --     Most recent vital signs: Vitals:   08/06/22 1522  BP: (!) 141/95  Pulse: 79  Resp: 20  Temp: 98.1 F (36.7 C)  SpO2: 98%     General: Awake, no distress.  CV:  Good peripheral perfusion.  Resp:  Normal effort.  Scattered mild wheezing Abd:  No distention.  Other:     ED Results / Procedures / Treatments   Labs (all labs ordered are listed, but only abnormal results are displayed) Labs Reviewed  RESP PANEL BY RT-PCR (RSV, FLU A&B, COVID)  [id number] - Abnormal; Notable for the following components:      Result Value   Resp Syncytial Virus by PCR POSITIVE (*)    All other components within normal limits     EKG     RADIOLOGY Chest x-ray viewed interpreted by me, no acute abnormality    PROCEDURES:  Critical Care performed:   Procedures   MEDICATIONS ORDERED IN ED: Medications - No data to display   IMPRESSION / MDM / [place] / ED COURSE  I reviewed the triage vital signs and the nursing notes. Patient's presentation is most consistent with acute illness / injury with system symptoms.  Patient presents with multiple symptoms as above,  suspicious for viral illness.  Differential includes influenza, COVID, RSV, other viral illness, pneumonia  Chest x-ray reassuring,  PCR positive RSV, will treat with steroids, recommend outpatient follow-up, return precautions discussed        FINAL CLINICAL IMPRESSION(S) / ED DIAGNOSES   Final diagnoses:  RSV infection     Rx / DC Orders   ED Discharge Orders          Ordered    predniSONE (DELTASONE) 50 MG tablet  Daily with breakfast        08/06/22 1638             Note:  This document was prepared using Dragon voice recognition software and may include unintentional dictation errors.   Lavonia Drafts, MD 08/06/22 [phone]

## 2022-08-06 NOTE — ED Provider Triage Note (Signed)
  Emergency Medicine Provider Triage Evaluation Note  Christine Higgins , a 66 y.o.female,  was evaluated in triage.  Pt complains of cough, congestion, and shortness of breath.  She states that she is coughing up green mucus.  Reports a history of asthma.   Review of Systems  Positive: Cough, congestion Negative: Denies fever, chest pain, vomiting  Physical Exam  There were no vitals filed for this visit. Gen:   Awake, no distress   Resp:  Normal effort  MSK:   Moves extremities without difficulty  Other:    Medical Decision Making  Given the patient's initial medical screening exam, the following diagnostic evaluation has been ordered. The patient will be placed in the appropriate treatment space, once one is available, to complete the evaluation and treatment. I have discussed the plan of care with the patient and I have advised the patient that an ED physician or mid-level practitioner will reevaluate their condition after the test results have been received, as the results may give them additional insight into the type of treatment they may need.    Diagnostics: Respiratory panel, CXR.  Treatments: none immediately   Teodoro Spray, [place] 08/06/22 1517

## 2022-08-08 ENCOUNTER — Ambulatory Visit: Payer: Medicare HMO | Admitting: Physician Assistant

## 2022-08-22 ENCOUNTER — Other Ambulatory Visit (INDEPENDENT_AMBULATORY_CARE_PROVIDER_SITE_OTHER): Payer: Self-pay | Admitting: Nurse Practitioner

## 2022-08-22 ENCOUNTER — Encounter (INDEPENDENT_AMBULATORY_CARE_PROVIDER_SITE_OTHER): Payer: Self-pay | Admitting: Nurse Practitioner

## 2022-08-22 ENCOUNTER — Encounter: Payer: Self-pay | Admitting: Physician Assistant

## 2022-08-22 ENCOUNTER — Ambulatory Visit (INDEPENDENT_AMBULATORY_CARE_PROVIDER_SITE_OTHER): Payer: Medicare HMO

## 2022-08-22 ENCOUNTER — Ambulatory Visit (INDEPENDENT_AMBULATORY_CARE_PROVIDER_SITE_OTHER): Payer: Medicare HMO | Admitting: Nurse Practitioner

## 2022-08-22 ENCOUNTER — Ambulatory Visit: Payer: Medicare HMO | Admitting: Physician Assistant

## 2022-08-22 VITALS — BP 160/89 | HR 65 | Resp 16

## 2022-08-22 DIAGNOSIS — R42 Dizziness and giddiness: Secondary | ICD-10-CM | POA: Diagnosis not present

## 2022-08-22 DIAGNOSIS — M7989 Other specified soft tissue disorders: Secondary | ICD-10-CM

## 2022-08-22 DIAGNOSIS — E042 Nontoxic multinodular goiter: Secondary | ICD-10-CM

## 2022-08-22 DIAGNOSIS — I10 Essential (primary) hypertension: Secondary | ICD-10-CM | POA: Diagnosis not present

## 2022-08-22 NOTE — Progress Notes (Signed)
Subjective:    Patient ID: Christine Higgins, female    DOB: Jan 22, 1956, 66 y.o.   MRN: [id number] Chief Complaint  Patient presents with   Follow-up    Ultrasound follow up    The patient returns today for evaluation of her carotid arteries due to some dizziness as well as some pulsatility that she noticed which was concerning for her.  She denies any pain of the area.  She also notes that her swelling is more improved and she is wearing her compression stockings on a regular basis.  Today noninvasive study showed no evidence of any significant stenosis in the bilateral internal carotid arteries.  Great flow was noted in the bilateral vertebral arteries.  Normal flow hemodynamics in the bilateral subclavian arteries.  It is also noted that she has enlarged thyroid lobes bilaterally but she does have a known history of being treated for overactive thyroid.     Review of Systems  Cardiovascular:  Positive for leg swelling.  All other systems reviewed and are negative.      Objective:   Physical Exam Vitals reviewed.  HENT:     Head: Normocephalic.  Neck:     Vascular: No carotid bruit.  Cardiovascular:     Rate and Rhythm: Normal rate.  Pulmonary:     Effort: Pulmonary effort is normal.  Musculoskeletal:     Cervical back: Neck supple.  Skin:    General: Skin is warm and dry.  Neurological:     Mental Status: She is alert and oriented to person, place, and time.  Psychiatric:        Mood and Affect: Mood normal.        Behavior: Behavior normal.        Thought Content: Thought content normal.        Judgment: Judgment normal.     BP (!) 160/89 (BP Location: Left Arm)   Pulse 65   Resp 16   Past Medical History:  Diagnosis Date   Anxiety    Asthma    Breast mass    LEFT x 3 months per pt and palpated by physician   Cervical disc disease    Chronic pain syndrome    COPD (chronic obstructive pulmonary disease) (HCC)    Degenerative disc disease, lumbar     osteoarthritis   Depression    Diabetes mellitus without complication (HCC)    Fibromyalgia    Fibromyalgia    GERD (gastroesophageal reflux disease)    Glaucoma    Graves disease    Headache    migraines   Hemorrhoids    History of hiatal hernia    Hyperlipidemia    Hypertension    Hyperthyroidism    Lumbar disc disease    Obesity    Pre-diabetes    Psoriatic arthritis (HCC)    Thyroid disease     Social History   Socioeconomic History   Marital status: Single    Spouse name: Not on file   Number of children: Not on file   Years of education: Not on file   Highest education level: Not on file  Occupational History   Not on file  Tobacco Use   Smoking status: Former    Types: Cigarettes    Quit date: 38    Years since quitting: 30.0   Smokeless tobacco: Never  Vaping Use   Vaping Use: Never used  Substance and Sexual Activity   Alcohol use: No    Alcohol/week: 0.0  standard drinks of alcohol   Drug use: Not Currently    Comment: 1991   Sexual activity: Not Currently    Birth control/protection: None  Other Topics Concern   Not on file  Social History Narrative   Live in assisted living   Social Determinants of Health   Financial Resource Strain: Not on file  Food Insecurity: Not on file  Transportation Needs: Not on file  Physical Activity: Not on file  Stress: Not on file  Social Connections: Not on file  Intimate Partner Violence: Not on file    Past Surgical History:  Procedure Laterality Date   BACK SURGERY     sumbar   CARDIAC CATHETERIZATION  2006   carpel tunn Right    carpel tunnel Left    CATARACT EXTRACTION Bilateral 2022   CESAREAN SECTION     COLONOSCOPY     COLONOSCOPY WITH PROPOFOL N/A 10/20/2020   Procedure: COLONOSCOPY WITH PROPOFOL;  Surgeon: Lesly Rubenstein, MD;  Location: [facility];  Service: Endoscopy;  Laterality: N/A;   ESOPHAGOGASTRODUODENOSCOPY (EGD) WITH PROPOFOL N/A 06/26/2017   Procedure:  ESOPHAGOGASTRODUODENOSCOPY (EGD) WITH PROPOFOL;  Surgeon: Lollie Sails, MD;  Location: [facility];  Service: Endoscopy;  Laterality: N/A;   EXCISION MORTON'S NEUROMA Left 02/05/2017   Procedure: EXCISION MORTON'S NEUROMA;  Surgeon: Samara Deist, DPM;  Location: [place];  Service: Podiatry;  Laterality: Left;  iva with local   FRACTURE SURGERY  1969   Left Elbow   HAND SURGERY Right    scar tissue removal   JOINT REPLACEMENT     right hip arthroplasty 08/25/15   KNEE ARTHROPLASTY Left 08/11/2017   Procedure: COMPUTER ASSISTED TOTAL KNEE ARTHROPLASTY;  Surgeon: Dereck Leep, MD;  Location: [facility];  Service: Orthopedics;  Laterality: Left;   KNEE ARTHROPLASTY Right 02/05/2021   Procedure: COMPUTER ASSISTED TOTAL KNEE ARTHROPLASTY;  Surgeon: Dereck Leep, MD;  Location: [facility];  Service: Orthopedics;  Laterality: Right;   KNEE ARTHROSCOPY Right 07/02/2018   Procedure: ARTHROSCOPY KNEE, Medial and Lateral  Chondroplasty;  Surgeon: Dereck Leep, MD;  Location: [facility];  Service: Orthopedics;  Laterality: Right;   LUMBAR LAMINECTOMY/DECOMPRESSION MICRODISCECTOMY N/A 06/19/2022   Procedure: EXPLORE LUMBAR WOUND FOR EVACUATION OF HEMATOMA;  Surgeon: Newman Pies, MD;  Location: [place];  Service: Neurosurgery;  Laterality: N/A;   NECK SURGERY     "disk implant"   REVERSE SHOULDER ARTHROPLASTY Left 06/24/2019   Procedure: REVERSE SHOULDER ARTHROPLASTY;  Surgeon: Corky Mull, MD;  Location: [facility];  Service: Orthopedics;  Laterality: Left;   SHOULDER SURGERY Right    spur removal   SPINAL CORD STIMULATOR REMOVAL N/A 04/09/2021   Procedure: REMOVAL SPINAL CORD STIMULATOR & PULSE GENERATOR;  Surgeon: [name], MD;  Location: [facility];  Service: Neurosurgery;  Laterality: N/A;  2nd case   THORACIC LAMINECTOMY FOR SPINAL CORD STIMULATOR N/A 08/07/2020   Procedure: THORACIC SPINAL CORD STIMULATOR VIA LAMINECTOMY, PULSE GENERATOR;  Surgeon: [name], MD;   Location: [facility];  Service: Neurosurgery;  Laterality: N/A;   TOTAL HIP ARTHROPLASTY Right    TUBAL LIGATION      Family History  Problem Relation Age of Onset   Cancer Mother    Heart disease Father    Breast cancer Neg Hx     Allergies  Allergen Reactions   Cephalexin Hives   Ibuprofen Nausea And Vomiting    Flu like symptoms   Potassium Chloride Itching   Shellfish Allergy Nausea And Vomiting  Aspirin Nausea And Vomiting       Latest Ref Rng & Units 06/19/2022    9:31 AM 06/10/2022    2:00 PM 04/04/2021    2:22 PM  CBC  WBC 4.0 - 10.5 K/uL 8.3  2.6  3.8   Hemoglobin 12.0 - 15.0 g/dL 10.1  12.3  10.5   Hematocrit 36.0 - 46.0 % 31.4  37.7  33.4   Platelets 150 - 400 K/uL 129  184  219       CMP     Component Value Date/Time   NA 136 06/24/2022 0403   NA 137 04/27/2020 1338   NA 132 (L) 08/26/2014 0458   K 2.9 (L) 06/24/2022 0403   K 3.8 08/28/2014 0359   CL 99 06/24/2022 0403   CL 95 (L) 08/26/2014 0458   CO2 30 06/24/2022 0403   CO2 31 08/26/2014 0458   GLUCOSE 142 (H) 06/24/2022 0403   GLUCOSE 155 (H) 08/26/2014 0458   BUN <5 (L) 06/24/2022 0403   BUN 9 04/27/2020 1338   BUN 5 (L) 08/26/2014 0458   CREATININE 0.62 06/24/2022 0403   CREATININE 0.74 08/26/2014 0458   CALCIUM 9.3 06/24/2022 0403   CALCIUM 8.6 08/26/2014 0458   PROT 6.9 01/25/2021 1150   PROT 6.9 04/27/2020 1338   ALBUMIN 4.3 01/25/2021 1150   ALBUMIN 4.7 04/27/2020 1338   AST 21 01/25/2021 1150   ALT 20 01/25/2021 1150   ALKPHOS 81 01/25/2021 1150   BILITOT 0.7 01/25/2021 1150   BILITOT 0.8 04/27/2020 1338   GFRNONAA >60 06/24/2022 0403   GFRNONAA >60 08/26/2014 0458   GFRAA 83 04/27/2020 1338   GFRAA >60 08/26/2014 0458     VAS [place] ABI WITH/WO TBI  Result Date: 08/12/2022  LOWER EXTREMITY DOPPLER STUDY Patient Name:  YURIDIANA FORMANEK  Date of Exam:   07/25/2022 Medical Rec #: [id number]       Accession #:    [id number] Date of Birth: [birth year]       Patient Gender: F Patient  Age:   9 years Exam Location:  [facility] Procedure:      VAS [place] ABI WITH/WO TBI Referring Phys: Arna Medici Mayling Aber --------------------------------------------------------------------------------  High Risk Factors: Hypertension, hyperlipidemia, past history of smoking.  Performing Technologist: Delorise Shiner RVT  Examination Guidelines: A complete evaluation includes at minimum, Doppler waveform signals and systolic blood pressure reading at the level of bilateral brachial, anterior tibial, and posterior tibial arteries, when vessel segments are accessible. Bilateral testing is considered an integral part of a complete examination. Photoelectric Plethysmograph (PPG) waveforms and toe systolic pressure readings are included as required and additional duplex testing as needed. Limited examinations for reoccurring indications may be performed as noted.  ABI Findings: +---------+------------------+-----+---------+--------+ Right    Rt Pressure (mmHg)IndexWaveform Comment  +---------+------------------+-----+---------+--------+ Brachial 189                                      +---------+------------------+-----+---------+--------+ PTA      201               1.06 triphasic         +---------+------------------+-----+---------+--------+ DP       226               1.20 triphasic         +---------+------------------+-----+---------+--------+ Illene Bolus  1.06                   +---------+------------------+-----+---------+--------+ +---------+------------------+-----+---------+-------+ Left     Lt Pressure (mmHg)IndexWaveform Comment +---------+------------------+-----+---------+-------+ Brachial 182                                     +---------+------------------+-----+---------+-------+ PTA      224               1.19 triphasic        +---------+------------------+-----+---------+-------+ DP       243               1.29 triphasic         +---------+------------------+-----+---------+-------+ Great Toe211               1.12                  +---------+------------------+-----+---------+-------+ +-------+-----------+-----------+------------+------------+ ABI/TBIToday's ABIToday's TBIPrevious ABIPrevious TBI +-------+-----------+-----------+------------+------------+ Right  1.20       1.06       1.18                     +-------+-----------+-----------+------------+------------+ Left   1.29       1.12       1.14                     +-------+-----------+-----------+------------+------------+ Bilateral ABIs appear essentially unchanged compared to prior study on 10/14/2016.  Summary: Right: Resting right ankle-brachial index is within normal range. The right toe-brachial index is normal. Left: Resting left ankle-brachial index is within normal range. The left toe-brachial index is normal. *See table(s) above for measurements and observations.  Electronically signed by Hortencia Pilar MD on 08/12/2022 at 4:31:59 PM.    Final        Assessment & Plan:   1. Leg swelling Recommend:   I have had a long discussion with the patient regarding swelling and why it  causes symptoms.  Patient will begin wearing graduated compression on a daily basis a prescription was given. The patient will  wear the stockings first thing in the morning and removing them in the evening. The patient is instructed specifically not to sleep in the stockings.    In addition, behavioral modification will be initiated.  This will include frequent elevation, use of over the counter pain medications and exercise such as walking.   Consideration for a lymph pump will also be made based upon the effectiveness of conservative therapy.  This would help to improve the edema control and prevent sequela such as ulcers and infections   Patient will follow-up in 6 months  2. HTN, goal below 140/90 Continue antihypertensive medications as already ordered,  these medications have been reviewed and there are no changes at this time.  3. Multiple thyroid nodules She does have known nodules.  Her thyroid was noted to be enlarged.  Patient will follow-up with endocrinologist to determine if this should require further workup.  4. Dizziness Today the patient had no evidence of significant carotid disease.  There is no evidence of subclavian steal.  Dizziness is not attributed to carotid disease.  She was also concerned with the pulsatility of the vessel in her neck.  I suspect this is just her jugular possibly displaced by her enlarged thyroid tissue.   Current Outpatient Medications on File Prior to Visit  Medication Sig Dispense Refill  acetaminophen (TYLENOL) 500 MG tablet Take 1,000 mg by mouth every 6 (six) hours as needed for mild pain or moderate pain.     albuterol (VENTOLIN HFA) 108 (90 Base) MCG/ACT inhaler Inhale 2 puffs into the lungs every 4 (four) hours as needed for shortness of breath or wheezing.     atorvastatin (LIPITOR) 10 MG tablet Take 10 mg by mouth daily after supper.      budesonide-formoterol (SYMBICORT) 160-4.5 MCG/ACT inhaler Inhale 2 puffs into the lungs 2 (two) times daily.     calcium carbonate (OS-CAL) 600 MG TABS tablet Take 600 mg by mouth every other day.     celecoxib (CELEBREX) 200 MG capsule Take 200 mg by mouth 2 (two) times daily.     Cholecalciferol (VITAMIN D3 SUPER STRENGTH) 50 MCG (2000 UT) TABS Take 3,000 Units by mouth daily.     clonazePAM (KLONOPIN) 0.5 MG tablet Take 0.5 mg by mouth at bedtime as needed for anxiety.     Coal Tar Extract (NEUTROGENA T/GEL EX) Apply 1 application  topically daily as needed (dandruff).     Cyanocobalamin (B-12) 2500 MCG TABS Take 5,000 mcg by mouth every other day.     diphenhydrAMINE (BENADRYL) 25 mg capsule Take 25 mg by mouth 4 (four) times daily as needed for itching or allergies.     diphenhydrAMINE-zinc acetate (BENADRYL ITCH STOPPING) cream Apply 1 application   topically 3 (three) times daily as needed for itching.     docusate sodium (COLACE) 100 MG capsule Take 1 capsule (100 mg total) by mouth 2 (two) times daily. 30 capsule 0   doxycycline (VIBRA-TABS) 100 MG tablet Take 1 tablet (100 mg total) by mouth every 12 (twelve) hours. 20 tablet 0   DULoxetine (CYMBALTA) 30 MG capsule Take 30 mg by mouth at bedtime.     DULoxetine (CYMBALTA) 60 MG capsule Take 60 mg by mouth daily.     esomeprazole (NEXIUM) 40 MG capsule Take 40 mg by mouth 2 (two) times daily.      fluticasone (FLONASE) 50 MCG/ACT nasal spray Place 2 sprays into both nostrils daily.     gabapentin (NEURONTIN) 600 MG tablet Take 1,200 mg by mouth in the morning, at noon, and at bedtime.     hydrOXYzine (ATARAX) 10 MG tablet Take 10-30 mg by mouth at bedtime as needed for itching.     ibandronate (BONIVA) 150 MG tablet Take 150 mg by mouth every 30 (thirty) days.     linaclotide (LINZESS) 290 MCG CAPS capsule Take 290 mcg by mouth daily.     losartan (COZAAR) 100 MG tablet Take 100 mg by mouth daily.     methimazole (TAPAZOLE) 5 MG tablet Take 5 mg by mouth daily.     methocarbamol 1000 MG TABS Take 1,000 mg by mouth 3 (three) times daily.     Multiple Vitamin (MULTIVITAMIN WITH MINERALS) TABS tablet Take 1 tablet by mouth daily.     oxyCODONE-acetaminophen (PERCOCET/ROXICET) 5-325 MG tablet Take 1-2 tablets by mouth every 4 (four) hours as needed for moderate pain. 48 tablet 0   potassium chloride (KLOR-CON) 10 MEQ tablet Take 10 mEq by mouth daily.     predniSONE (DELTASONE) 50 MG tablet Take 1 tablet (50 mg total) by mouth daily with breakfast. 4 tablet 0   propranolol (INDERAL) 80 MG tablet Take 80 mg by mouth 2 (two) times daily.     RESTASIS 0.05 % ophthalmic emulsion Place 1 drop into both eyes 2 (two) times daily.  sucralfate (CARAFATE) 1 g tablet Take 1 g by mouth 4 (four) times daily -  before meals and at bedtime.     torsemide (DEMADEX) 10 MG tablet Take 10 mg by mouth daily.      traZODone (DESYREL) 150 MG tablet Take 75 mg by mouth at bedtime as needed for sleep.      vitamin E 180 MG (400 UNITS) capsule Take 400 Units by mouth daily.     alum & mag hydroxide-simeth (ANTACID LIQUID) 200-200-20 MG/5ML suspension Take 30 mLs by mouth 4 (four) times daily as needed for indigestion or heartburn. Notify provider if symptoms persist for more than 3 days (Patient not taking: Reported on 07/25/2022)     Emollient (GOLD BOND HEALING) LOTN Apply 1 application  topically daily. May keep at bedside. (Patient not taking: Reported on 07/25/2022)     guaiFENesin (ROBITUSSIN) 100 MG/5ML liquid Take 15 mLs by mouth every 6 (six) hours as needed for cough. (Patient not taking: Reported on 07/25/2022)     levocetirizine (XYZAL) 5 MG tablet Take 5 mg by mouth every evening. (Patient not taking: Reported on 07/25/2022)     loperamide (IMODIUM A-D) 2 MG tablet Take 4 mg by mouth 4 (four) times daily as needed for diarrhea or loose stools. (Patient not taking: Reported on 07/25/2022)     magnesium hydroxide (MILK OF MAGNESIA) 400 MG/5ML suspension Take 30 mLs by mouth daily as needed (constipation). (Patient not taking: Reported on 07/25/2022)     menthol-zinc oxide (GOLD BOND) powder Apply 1 application  topically daily as needed (irritation). (Patient not taking: Reported on 07/25/2022)     OVER THE COUNTER MEDICATION Apply 1 application  topically See admin instructions. Barrier Cream. Apply topically after toileting as needed to prevent skin breakdown. May be kept in residents room. (Patient not taking: Reported on 07/25/2022)     pyrithione zinc (HEAD AND SHOULDERS) 1 % shampoo Apply 1 application  topically daily as needed for itching (dandruff). (Patient not taking: Reported on 07/25/2022)     Skin Protectants, Misc. (BAZA PROTECT EX) Apply 1 application  topically as needed (skin breakdown). (Patient not taking: Reported on 07/25/2022)     No current facility-administered medications on  file prior to visit.    There are no Patient Instructions on file for this visit. No follow-ups on file.   Kris Hartmann, NP

## 2022-09-02 DIAGNOSIS — M7581 Other shoulder lesions, right shoulder: Secondary | ICD-10-CM | POA: Diagnosis not present

## 2022-09-02 DIAGNOSIS — R9389 Abnormal findings on diagnostic imaging of other specified body structures: Secondary | ICD-10-CM | POA: Diagnosis not present

## 2022-09-02 DIAGNOSIS — Z01419 Encounter for gynecological examination (general) (routine) without abnormal findings: Secondary | ICD-10-CM | POA: Diagnosis not present

## 2022-09-02 DIAGNOSIS — Z1331 Encounter for screening for depression: Secondary | ICD-10-CM | POA: Diagnosis not present

## 2022-09-02 DIAGNOSIS — B354 Tinea corporis: Secondary | ICD-10-CM | POA: Diagnosis not present

## 2022-09-02 DIAGNOSIS — N950 Postmenopausal bleeding: Secondary | ICD-10-CM | POA: Diagnosis not present

## 2022-09-02 DIAGNOSIS — D229 Melanocytic nevi, unspecified: Secondary | ICD-10-CM | POA: Diagnosis not present

## 2022-09-02 DIAGNOSIS — Z124 Encounter for screening for malignant neoplasm of cervix: Secondary | ICD-10-CM | POA: Diagnosis not present

## 2022-09-02 DIAGNOSIS — I10 Essential (primary) hypertension: Secondary | ICD-10-CM | POA: Diagnosis not present

## 2022-09-10 ENCOUNTER — Ambulatory Visit: Payer: Medicare HMO | Admitting: Dermatology

## 2022-09-12 DIAGNOSIS — E0500 Thyrotoxicosis with diffuse goiter without thyrotoxic crisis or storm: Secondary | ICD-10-CM | POA: Diagnosis not present

## 2022-09-12 DIAGNOSIS — R7303 Prediabetes: Secondary | ICD-10-CM | POA: Diagnosis not present

## 2022-09-12 DIAGNOSIS — E118 Type 2 diabetes mellitus with unspecified complications: Secondary | ICD-10-CM | POA: Diagnosis not present

## 2022-09-12 DIAGNOSIS — M65342 Trigger finger, left ring finger: Secondary | ICD-10-CM | POA: Diagnosis not present

## 2022-09-12 DIAGNOSIS — Z6835 Body mass index (BMI) 35.0-35.9, adult: Secondary | ICD-10-CM | POA: Diagnosis not present

## 2022-09-12 DIAGNOSIS — I700 Atherosclerosis of aorta: Secondary | ICD-10-CM | POA: Diagnosis not present

## 2022-09-12 DIAGNOSIS — I10 Essential (primary) hypertension: Secondary | ICD-10-CM | POA: Diagnosis not present

## 2022-09-19 ENCOUNTER — Emergency Department: Payer: Medicare HMO

## 2022-09-19 ENCOUNTER — Emergency Department
Admission: EM | Admit: 2022-09-19 | Discharge: 2022-09-19 | Disposition: A | Discharge status: Home / Self Care | Payer: Medicare HMO | Attending: Emergency Medicine | Admitting: Emergency Medicine

## 2022-09-19 DIAGNOSIS — J449 Chronic obstructive pulmonary disease, unspecified: Secondary | ICD-10-CM | POA: Insufficient documentation

## 2022-09-19 DIAGNOSIS — R519 Headache, unspecified: Secondary | ICD-10-CM | POA: Diagnosis not present

## 2022-09-19 DIAGNOSIS — E119 Type 2 diabetes mellitus without complications: Secondary | ICD-10-CM | POA: Insufficient documentation

## 2022-09-19 DIAGNOSIS — M2578 Osteophyte, vertebrae: Secondary | ICD-10-CM | POA: Diagnosis not present

## 2022-09-19 DIAGNOSIS — W07XXXA Fall from chair, initial encounter: Secondary | ICD-10-CM | POA: Diagnosis not present

## 2022-09-19 DIAGNOSIS — M545 Low back pain, unspecified: Secondary | ICD-10-CM | POA: Diagnosis not present

## 2022-09-19 DIAGNOSIS — Z9889 Other specified postprocedural states: Secondary | ICD-10-CM | POA: Diagnosis not present

## 2022-09-19 DIAGNOSIS — I10 Essential (primary) hypertension: Secondary | ICD-10-CM | POA: Diagnosis not present

## 2022-09-19 DIAGNOSIS — M47812 Spondylosis without myelopathy or radiculopathy, cervical region: Secondary | ICD-10-CM | POA: Diagnosis not present

## 2022-09-19 DIAGNOSIS — S199XXA Unspecified injury of neck, initial encounter: Secondary | ICD-10-CM | POA: Diagnosis not present

## 2022-09-19 LAB — COMPREHENSIVE METABOLIC PANEL
ALT: 20 U/L (ref 0–44)
AST: 21 U/L (ref 15–41)
Albumin: 4.7 g/dL (ref 3.5–5.0)
Alkaline Phosphatase: 97 U/L (ref 38–126)
Anion gap: 9 (ref 5–15)
BUN: 9 mg/dL (ref 8–23)
CO2: 24 mmol/L (ref 22–32)
Calcium: 9.6 mg/dL (ref 8.9–10.3)
Chloride: 100 mmol/L (ref 98–111)
Creatinine, Ser: 0.71 mg/dL (ref 0.44–1.00)
GFR, Estimated: >60 mL/min (ref >60)
Glucose, Bld: 140 mg/dL — ABNORMAL HIGH (ref 70–99)
Potassium: 3.8 mmol/L (ref 3.5–5.1)
Sodium: 133 mmol/L — ABNORMAL LOW (ref 135–145)
Total Bilirubin: 0.7 mg/dL (ref 0.3–1.2)
Total Protein: 7.8 g/dL (ref 6.5–8.1)

## 2022-09-19 LAB — CBC WITH DIFFERENTIAL/PLATELET
Abs Immature Granulocytes: 0.02 10*3/uL (ref 0.00–0.07)
Basophils Absolute: 0 10*3/uL (ref 0.0–0.1)
Basophils Relative: 0 %
Eosinophils Absolute: 0 10*3/uL (ref 0.0–0.5)
Eosinophils Relative: 0 %
HCT: 38 % (ref 36.0–46.0)
Hemoglobin: 11.9 g/dL — ABNORMAL LOW (ref 12.0–15.0)
Immature Granulocytes: 0 %
Lymphocytes Relative: 10 %
Lymphs Abs: 0.5 10*3/uL — ABNORMAL LOW (ref 0.7–4.0)
MCH: 24.2 pg — ABNORMAL LOW (ref 26.0–34.0)
MCHC: 31.3 g/dL (ref 30.0–36.0)
MCV: 77.4 fL — ABNORMAL LOW (ref 80.0–100.0)
Monocytes Absolute: 0.1 10*3/uL (ref 0.1–1.0)
Monocytes Relative: 1 %
Neutro Abs: 4.7 10*3/uL (ref 1.7–7.7)
Neutrophils Relative %: 89 %
Platelets: 231 10*3/uL (ref 150–400)
RBC: 4.91 MIL/uL (ref 3.87–5.11)
RDW: 14.6 % (ref 11.5–15.5)
WBC: 5.3 10*3/uL (ref 4.0–10.5)
nRBC: 0 % (ref 0.0–0.2)

## 2022-09-19 MED ORDER — DIPHENHYDRAMINE HCL 50 MG/ML IJ SOLN
25.0000 mg | Freq: Once | INTRAMUSCULAR | Status: AC
  Administered 2022-09-19: 25 mg via INTRAVENOUS
  Filled 2022-09-19: qty 1

## 2022-09-19 MED ORDER — ACETAMINOPHEN 325 MG PO TABS
650.0000 mg | ORAL_TABLET | Freq: Once | ORAL | Status: AC
  Administered 2022-09-19: 650 mg via ORAL
  Filled 2022-09-19: qty 2

## 2022-09-19 MED ORDER — IOHEXOL 350 MG/ML SOLN
75.0000 mL | Freq: Once | INTRAVENOUS | Status: AC | PRN
  Administered 2022-09-19: 75 mL via INTRAVENOUS

## 2022-09-19 MED ORDER — METOCLOPRAMIDE HCL 5 MG/ML IJ SOLN
10.0000 mg | Freq: Once | INTRAMUSCULAR | Status: AC
  Administered 2022-09-19: 10 mg via INTRAVENOUS
  Filled 2022-09-19: qty 2

## 2022-09-19 NOTE — ED Provider Notes (Signed)
[facility] Provider Note  Patient Contact: 5:06 PM (approximate)   History   Fall   HPI  Christine Higgins is a 67 y.o. female with a history of hypertension, Graves' disease, fibromyalgia, COPD and diabetes, presents to the emergency department with acutely worsening headache that started today.  Patient states that 2 weeks ago, she had a mechanical fall from her chair and hit the back of her head.  She states that she only had a minor headache after fall but was concerned that when severe headache developed today, and might have developed from the fall.  She denies weakness of the upper and lower extremities.  No dizziness or unsteadiness on her feet.  She denies chest pain, chest tightness or shortness of breath.  She reports that she is never had a headache like this in the past.  She does report that her blood pressure has been mildly elevated at home.      Physical Exam   Triage Vital Signs: ED Triage Vitals  Enc Vitals Group     BP 09/19/22 1454 (!) 166/92     Pulse Rate 09/19/22 1454 80     Resp 09/19/22 1454 18     Temp 09/19/22 1454 98.9 F (37.2 C)     Temp Source 09/19/22 1454 Oral     SpO2 09/19/22 1454 100 %     Weight 09/19/22 1455 270 lb (122.5 kg)     Height --      Head Circumference --      Peak Flow --      Pain Score 09/19/22 1455 9     Pain Loc --      Pain Edu? --      Excl. in [place]? --     Most recent vital signs: Vitals:   09/19/22 1902 09/19/22 1942  BP: (!) 160/90 (!) 158/93  Pulse: 82 80  Resp: 17 16  Temp:  98.8 F (37.1 C)  SpO2: 99% 99%     General: Alert and in no acute distress. Eyes:  PERRL. EOMI. Head: No acute traumatic findings ENT:      Nose: No congestion/rhinnorhea.      Mouth/Throat: Mucous membranes are moist. Neck: No stridor. No cervical spine tenderness to palpation. Hematological/Lymphatic/Immunilogical: No cervical lymphadenopathy. Cardiovascular:  Good peripheral perfusion Respiratory:  Normal respiratory effort without tachypnea or retractions. Lungs CTAB. Good air entry to the bases with no decreased or absent breath sounds. Gastrointestinal: Bowel sounds 4 quadrants. Soft and nontender to palpation. No guarding or rigidity. No palpable masses. No distention. No CVA tenderness. Musculoskeletal: Full range of motion to all extremities.  Neurologic:  No gross focal neurologic deficits are appreciated.  Skin:   No rash noted    ED Results / Procedures / Treatments   Labs (all labs ordered are listed, but only abnormal results are displayed) Labs Reviewed  CBC WITH DIFFERENTIAL/PLATELET - Abnormal; Notable for the following components:      Result Value   Hemoglobin 11.9 (*)    MCV 77.4 (*)    MCH 24.2 (*)    Lymphs Abs 0.5 (*)    All other components within normal limits  COMPREHENSIVE METABOLIC PANEL - Abnormal; Notable for the following components:   Sodium 133 (*)    Glucose, Bld 140 (*)    All other components within normal limits        RADIOLOGY  I personally viewed and evaluated these images as part of my medical  decision making, as well as reviewing the written report by the radiologist.  ED Provider Interpretation: No  CTA head and neck shows no acute abnormality.  PROCEDURES:  Critical Care performed: No  Procedures   MEDICATIONS ORDERED IN ED: Medications  metoCLOPramide (REGLAN) injection 10 mg (10 mg Intravenous Given 09/19/22 1751)  diphenhydrAMINE (BENADRYL) injection 25 mg (25 mg Intravenous Given 09/19/22 1750)  acetaminophen (TYLENOL) tablet 650 mg (650 mg Oral Given 09/19/22 1750)  iohexol (OMNIPAQUE) 350 MG/ML injection 75 mL (75 mLs Intravenous Contrast Given 09/19/22 1840)     IMPRESSION / MDM / ASSESSMENT AND PLAN / ED COURSE  I reviewed the triage vital signs and the nursing notes.                              Assessment and plan:   Headache 67 year old female presents to the emergency department with headache that  worsened in intensity acutely today.  Patient was hypertensive at triage but vital signs otherwise reassuring.  CMP indicates mild hyponatremia but otherwise reassuring.  CBC largely within range.  CTA head and neck shows no acute abnormality.  Patient reported that her headache improved after Reglan and Benadryl.  I did discuss the importance of following up with patient's PCP to reassess blood pressure medications.  She voiced understanding.  Return precautions were given to return with new or worsening symptoms.   FINAL CLINICAL IMPRESSION(S) / ED DIAGNOSES   Final diagnoses:  Acute nonintractable headache, unspecified headache type     Rx / DC Orders   ED Discharge Orders     None        Note:  This document was prepared using Dragon voice recognition software and may include unintentional dictation errors.   Christine Higgins, [place] 09/19/22 1946    Nathaniel Man, MD 09/19/22 2005

## 2022-09-19 NOTE — ED Provider Triage Note (Signed)
Emergency Medicine Provider Triage Evaluation Note  Christine Higgins , a 67 y.o. female  was evaluated in triage.  Pt complains of headache and back pain since January 12. The chair she was sitting in broke and she fell onto the floor. Pain since then. No loss of consciousness.  Physical Exam  BP (!) 166/92 (BP Location: Right Arm)   Pulse 80   Temp 98.9 F (37.2 C) (Oral)   Resp 18   Wt 122.5 kg   SpO2 100%   BMI 37.66 kg/m  Gen:   Awake, no distress   Resp:  Normal effort  MSK:   Moves extremities without difficulty  Other:    Medical Decision Making  Medically screening exam initiated at 2:56 PM.  Appropriate orders placed.  Christine Higgins was informed that the remainder of the evaluation will be completed by another provider, this initial triage assessment does not replace that evaluation, and the importance of remaining in the ED until their evaluation is complete.    Christine Dike, FNP 09/19/22 1500

## 2022-09-19 NOTE — ED Triage Notes (Signed)
Pt sts that she was sitting in a chair on 01/12 and the chair broke and pt hit her head and lower back. Pt sts that she is having head and lower back pain as well as a jerking movement in her left arm.

## 2022-09-24 ENCOUNTER — Ambulatory Visit
Admission: RE | Admit: 2022-09-24 | Discharge: 2022-09-24 | Disposition: A | Discharge status: Home / Self Care | Payer: Medicare HMO | Source: Ambulatory Visit | Attending: Obstetrics and Gynecology | Admitting: Obstetrics and Gynecology

## 2022-09-24 DIAGNOSIS — Z1231 Encounter for screening mammogram for malignant neoplasm of breast: Secondary | ICD-10-CM | POA: Insufficient documentation

## 2022-09-24 DIAGNOSIS — W01198A Fall on same level from slipping, tripping and stumbling with subsequent striking against other object, initial encounter: Secondary | ICD-10-CM | POA: Diagnosis not present

## 2022-09-24 DIAGNOSIS — Y92009 Unspecified place in unspecified non-institutional (private) residence as the place of occurrence of the external cause: Secondary | ICD-10-CM | POA: Diagnosis not present

## 2022-09-24 DIAGNOSIS — I10 Essential (primary) hypertension: Secondary | ICD-10-CM | POA: Diagnosis not present

## 2022-09-24 DIAGNOSIS — E042 Nontoxic multinodular goiter: Secondary | ICD-10-CM | POA: Diagnosis not present

## 2022-09-24 DIAGNOSIS — E0500 Thyrotoxicosis with diffuse goiter without thyrotoxic crisis or storm: Secondary | ICD-10-CM | POA: Diagnosis not present

## 2022-09-24 DIAGNOSIS — R7303 Prediabetes: Secondary | ICD-10-CM | POA: Diagnosis not present

## 2022-09-24 DIAGNOSIS — E119 Type 2 diabetes mellitus without complications: Secondary | ICD-10-CM | POA: Diagnosis not present

## 2022-09-27 DIAGNOSIS — G5603 Carpal tunnel syndrome, bilateral upper limbs: Secondary | ICD-10-CM | POA: Diagnosis not present

## 2022-10-01 ENCOUNTER — Other Ambulatory Visit: Payer: Medicare HMO

## 2022-10-01 DIAGNOSIS — L4050 Arthropathic psoriasis, unspecified: Secondary | ICD-10-CM | POA: Diagnosis not present

## 2022-10-07 NOTE — H&P (Signed)
[name] is a 67 y.o. female here for FX D+C and hysteroscopy , possible myosure resection of polyp if found  .   Pr here for gyn exam and follow up for PMB and thickened stripe in ability to sample lining of endometrium    She has had more spotting .  Also her for preventative exam and concerned about an growing mole of left breast . Also with itching of superior area of left breast for 3 weeks . She has tried OTC med swith out success    Past Medical History:  has a past medical history of Atherosclerosis of abdominal aorta (CMS-HCC) (07/29/2014), Carpal tunnel syndrome, Cervical disc disease, COPD (chronic obstructive pulmonary disease) (CMS-HCC), Degeneration of lumbar intervertebral disc (03/22/2016), Depression, major, in remission (CMS-HCC) (12/21/2015), Failed back surgical syndrome (06/23/2017), Fibromyalgia, Generalized anxiety disorder (06/23/2017), GERD (gastroesophageal reflux disease), Graves disease, Graves' orbitopathy (02/17/2017), Hemorrhoids (03/14/2014), History of colon polyps (07/09/2019), History of lumbar laminectomy for spinal cord decompression (02/25/2019), Hyperlipidemia, Hypertension, Impingement syndrome of shoulder, left (10/20/2014), Lumbar disc disease, Migraines (02/03/2014), Mild intermittent asthma with acute exacerbation (08/13/2018), Multiple thyroid nodules, Osteoporosis, Primary osteoarthritis involving multiple joints (02/16/2019), Psoriasis (12/22/2019), Psoriatic arthritis (CMS-HCC), Right carpal tunnel syndrome (03/27/2017), Rotator cuff tendinitis, left (09/14/2018), Seasonal allergies, Severe obesity (BMI 35.0-35.9 with comorbidity) (08/09/2014), Status post reverse total shoulder replacement, left (06/24/2019), Status post total left knee replacement (09/28/2017), Status post total replacement of right hip (08/21/2015), and Type 2 diabetes mellitus (CMS-HCC).  Past Surgical History:  has a past surgical history that includes Right Shoulder Surgery (Right,  2005); Bilateral Carpal Tunnel Release; Scar tissue excision (Right); Back surgery (10/30/2010); Back surgery (2009); Hemorrhoidectomy By Simple Ligation (2015); colonoscopy (10/05/2001); colonoscopy (09/17/2013); egd (09/17/2013); Colonoscopy; Upper gastrointestinal endoscopy; Neck surgery; Right total hip arthroplasty (Right, 08/24/2014); egd (06/26/2017); Left total knee arthroplasty using computer-assisted navigation (08/11/2017); Right knee arthrosopy, partial medial and lateral meniscectomies, and chondroplasty (07/02/2018); Carpal tunnel release; Spine surgery; Reverse left total shoulder arthroplasty (Left, 06/24/2019); THORACIC SPINAL CORD STIMULATOR VIA LAMINECTOMY, PULSE GENERATOR (08/07/2020); Colonoscopy (10/20/2020); Right total knee arthroplasty using computer-assisted navigation (02/05/2021); and removal of spinal cord stimulator & pulse generator (04/09/2021). Family History: family history includes Allergies in her daughter and sister; Arthritis in her sister; Breast cancer in her mother; Heart disease in her father and sister; Heart failure in her father; High blood pressure (Hypertension) in her father, mother, and sister; Liver disease in her brother and maternal uncle; Myocardial Infarction (Heart attack) in her father; No Known Problems in her brother, brother, brother, maternal grandfather, maternal grandmother, paternal grandfather, and paternal grandmother; Rheum arthritis in her mother; Stroke in her paternal aunt and paternal aunt; Thyroid disease in her daughter. Social History:  reports that she quit smoking about 30 years ago. Her smoking use included cigarettes. She has a 22 pack-year smoking history. She has never used smokeless tobacco. She reports that she does not drink alcohol and does not use drugs. OB/GYN History:  OB History       Gravida  2   Para  1   Term  1   Preterm      AB  1   Living  1        SAB      IAB  1   Ectopic      Molar       Multiple      Live Births  1             Allergies: is allergic  to keflex [cephalexin], aspirin, ibuprofen, klor-con [potassium chloride], and shellfish containing products. Medications:   Current Outpatient Medications:    ACCU-CHEK SOFTCLIX LANCETS lancets, , Disp: , Rfl:    acetaminophen (TYLENOL) 650 MG ER tablet, Take 2 tablets (1,300 mg total) by mouth 2 (two) times daily, Disp: 120 tablet, Rfl: 11   albuterol 90 mcg/actuation inhaler, Inhale 2 inhalations into the lungs every 4 (four) hours as needed, Disp: 18 g, Rfl: 6   ALCOHOL PREP PADS PadM, , Disp: , Rfl:    ascorbic acid, vitamin C, (VITAMIN C) 1000 MG tablet, Take 1,000 mg by mouth once daily, Disp: , Rfl:    atorvastatin (LIPITOR) 10 MG tablet, Take 10 mg by mouth once daily 1 tablet 10 mg by mouth every evening after dinner., Disp: , Rfl:    B-complex with vitamin C (VITAMIN B COMPLEX-C ORAL), Take 1 tablet by mouth every other day, Disp: , Rfl:    budesonide-formoteroL (SYMBICORT) 160-4.5 mcg/actuation inhaler, Inhale 2 inhalations into the lungs 2 (two) times daily, Disp: 10.2 g, Rfl: 11   calcium carbonate 600 mg calcium (1,500 mg) Tab tablet, Take 600 mg by mouth once daily, Disp: , Rfl:    cholecalciferol (VITAMIN D3) 1000 unit tablet, Take 3,000 Units by mouth, Disp: , Rfl:    clobetasoL (TEMOVATE) 0.05 % ointment, CONTINUE TO MORE SEVERE AREAS OF RASH 1-2 TIMES DAILY FOR UP TO 2 WEEKS AT A TIME AS NEEDED FOR RASH. AVOID APPLYING TO FACE, GROIN, AND AXILLA. USE AS DIRECTED. RISK OF SKIN ATROPHY WITH LONG-TERM USE REVIEWED., Disp: , Rfl:    clonazePAM (KLONOPIN) 0.5 MG tablet, Take 0.5 mg by mouth at bedtime as needed, Disp: , Rfl:    docusate (COLACE) 100 MG capsule, Take 100 mg by mouth 2 (two) times daily, Disp: , Rfl:    doxycycline (VIBRAMYCIN) 100 MG capsule, Take 100 mg by mouth 2 (two) times daily, Disp: , Rfl:    DULoxetine (CYMBALTA) 30 MG DR capsule, Take 1 capsule (30 mg total) by mouth every morning, Disp:  90 capsule, Rfl: 3   DULoxetine (CYMBALTA) 60 MG DR capsule, Take 1 capsule (60 mg total) by mouth nightly, Disp: 90 capsule, Rfl: 3   esomeprazole (NEXIUM) 40 MG DR capsule, Take 1 capsule (40 mg total) by mouth 2 (two) times daily, Disp: 180 capsule, Rfl: 3   estradioL (ESTRACE) 0.01 % (0.1 mg/gram) vaginal cream, Place vaginally, Disp: , Rfl:    fluconazole (DIFLUCAN) 150 MG tablet, Take 150 mg by mouth once, Disp: , Rfl:    fluticasone propionate (FLONASE) 50 mcg/actuation nasal spray, Place 2 sprays into both nostrils once daily, Disp: 48 g, Rfl: 3   gabapentin (NEURONTIN) 600 MG tablet, Take 2 tablets (1,200 mg total) by mouth 3 (three) times a day, Disp: 540 tablet, Rfl: 1   hydrOXYzine HCL (ATARAX) 10 MG tablet, Take 10 mg by mouth 3 (three) times daily as needed, Disp: , Rfl:    ibandronate (BONIVA) 150 mg tablet, Take 1 tablet (150 mg total) by mouth every 30 (thirty) days Take with a full glass of water. Do not lie down for the next 60 min., Disp: 3 tablet, Rfl: 4   ketoconazole (NIZORAL) 2 % shampoo, Massage into scalp and let sit 10-15 mins before rinsing. Use 2x/week., Disp: , Rfl:    levocetirizine (XYZAL) 5 MG tablet, Take 5 mg by mouth every evening, Disp: , Rfl:    LINZESS 290 mcg capsule, Take 1 capsule (290 mcg total)  by mouth once daily, Disp: 90 capsule, Rfl: 3   losartan (COZAAR) 100 MG tablet, Take 1 tablet (100 mg total) by mouth once daily, Disp: 90 tablet, Rfl: 11   methIMAzole (TAPAZOLE) 5 MG tablet, Take 1 tablet (5 mg total) by mouth once daily, Disp: 90 tablet, Rfl: 1   methocarbamoL (ROBAXIN) 500 MG tablet, Take 500 mg by mouth 2 (two) times daily, Disp: , Rfl:    miconazole 2 % cream, Apply topically, Disp: , Rfl:    MULTIVITAMIN tablet, Take 1 tablet by mouth once daily, Disp: , Rfl:    oxyCODONE-acetaminophen (PERCOCET) 5-325 mg tablet, Take 1-2 tablets by mouth every 4 (four) hours as needed (moderate pain), Disp: , Rfl:    potassium chloride (KLOR-CON) 10 MEQ ER  tablet, Take 1 tablet (10 mEq total) by mouth once daily, Disp: 90 tablet, Rfl: 11   predniSONE (DELTASONE) 20 MG tablet, Take 2 tablets (40 mg total) by mouth once daily, Disp: 10 tablet, Rfl: 0   propranoloL (INDERAL) 80 MG tablet, Take 1 tablet (80 mg total) by mouth 2 (two) times daily, Disp: 60 tablet, Rfl: 11   RESTASIS 0.05 % ophthalmic emulsion, Place 1 drop into both eyes 2 (two) times daily, Disp: , Rfl:    sucralfate (CARAFATE) 1 gram tablet, Take 1 tablet by mouth 4 (four) times daily, Disp: , Rfl:    tamsulosin (FLOMAX) 0.4 mg capsule, Take by mouth, Disp: , Rfl:    TORsemide (DEMADEX) 10 MG tablet, Take 1 tablet (10 mg total) by mouth once daily, Disp: 90 tablet, Rfl: 11   traZODone (DESYREL) 150 MG tablet, Take 0.5 tablets (75 mg total) by mouth nightly as needed for Sleep Take 1/2 tablet (75  mg) by mouth PRN for sleep., Disp: , Rfl:    triamcinolone 0.1 % cream, Apply topically 2 (two) times daily, Disp: , Rfl:    VITAMIN A ORAL, Take 400 mg by mouth every other day, Disp: , Rfl:    vitamin E 400 UNIT capsule, Take 400 Units by mouth once daily., Disp: , Rfl:    Review of Systems: General:                      No fatigue or weight loss Eyes:                           No vision changes Ears:                            No hearing difficulty Respiratory:                No cough or shortness of breath Pulmonary:                  No asthma or shortness of breath Cardiovascular:           No chest pain, palpitations, dyspnea on exertion Gastrointestinal:          No abdominal bloating, chronic diarrhea, constipations, masses, pain or hematochezia Genitourinary:             No hematuria, dysuria, abnormal vaginal discharge, pelvic pain, Menometrorrhagia Lymphatic:                   No swollen lymph nodes Musculoskeletal:         No muscle weakness Neurologic:  No extremity weakness, syncope, seizure disorder Psychiatric:                  No history of depression,  delusions or suicidal/homicidal ideation      Exam:       Vitals:    10/08/22   BP: (!) 150/81  Pulse: 71      Body mass index is 38.08 kg/m.   WDWN white/  female in NAD   Lungs: CTA  CV : RRR without murmur   Breast: exam done in sitting and lying position : No dimpling or retraction, no dominant mass, no spontaneous discharge, no axillary adenopathy LEft 12 breast slight hyperpigmentation . ? Slight rash , no peau d' orange  2 oclock nevus 1x1 cm with central hyperpigmentation  Neck:  no thyromegaly Abdomen: soft , no mass, normal active bowel sounds,  non-tender, no rebound tenderness Pelvic: tanner stage 5 ,  External genitalia: vulva /labia no lesions Urethra: no prolapse Vagina: normal physiologic d/c Cervix: no lesions, no cervical motion tenderness   Uterus: normal size shape and contour, non-tender Adnexa: no mass,  non-tender   Rectovaginal:  U/s :  Uterus anteverted   Fluid filled endometrium=7.72m;  walls:0.23 + 0.25cm=0.48cm(4.851m   Rt ovary appears wnl Left ovary appears wnl No free fluid seen  PHQ-9 from today's flowsheet   Patient Health Questionnaire (PHQ-9) Little interest or pleasure in doing things: More Than Half the Days Feeling down, depressed, or hopeless (or irritable for Teens only)?: More Than Half the Days Total Prescreening Score: 4 Trouble falling or staying asleep, or sleeping too much?: Nearly every day Feeling tired or having little energy?: Several days Poor appetite or overeating?: More than half the days Feeling bad about yourself - or that you are a failure or have let yourself or your family down?: More than half the days Trouble concentrating on things, such as reading the newspaper or watching television?: Several days Moving or speaking so slowly that other people could have noticed?  Or the opposite - being so fidgety or restless that you have been moving around a lot more than usual?: Not at all Thoughts that you would be  better off dead, or of hurting yourself in some way?: Not at all How difficult have these problems made it for you do your work, take care of things at home, or get along with people?: Somewhat difficult Total Score =: 13   Depression Severity and Treatment Recommendations:  0-4= None  5-9= Mild / Treatment: Support, educate to call if worse; return in one month  10-14= Moderate / Treatment: Support, watchful waiting; Antidepressant or Psychotherapy  15-19= Moderately severe / Treatment: Antidepressant OR Psychotherapy  >= 20 = Major depression, severe / Antidepressant AND Psychotherapy     Impression:    The primary encounter diagnosis was Encounter for screening fecal occult blood testing. Diagnoses of Encounter for gynecological examination with abnormal finding, Atypical nevus, Tinea corporis, Routine cervical smear, Hypertension, uncontrolled, and Essential hypertension were also pertinent to this visit.       Plan:    Given thickened stripe and unable to sample I recommend a Fx D+C and h/s in OR , possible myosure  Risks discussed - see [place]     No follow-ups on file.   THCaroline SaugerMD

## 2022-10-08 ENCOUNTER — Encounter: Payer: Self-pay | Admitting: Student in an Organized Health Care Education/Training Program

## 2022-10-08 ENCOUNTER — Ambulatory Visit
Payer: Medicare HMO | Attending: Student in an Organized Health Care Education/Training Program | Admitting: Student in an Organized Health Care Education/Training Program

## 2022-10-08 VITALS — BP 140/90 | HR 80 | Temp 97.8°F | Resp 18 | Ht 71.0 in | Wt 269.0 lb

## 2022-10-08 DIAGNOSIS — M5412 Radiculopathy, cervical region: Secondary | ICD-10-CM | POA: Insufficient documentation

## 2022-10-08 DIAGNOSIS — M4802 Spinal stenosis, cervical region: Secondary | ICD-10-CM | POA: Insufficient documentation

## 2022-10-08 DIAGNOSIS — E118 Type 2 diabetes mellitus with unspecified complications: Secondary | ICD-10-CM | POA: Diagnosis not present

## 2022-10-08 DIAGNOSIS — Z981 Arthrodesis status: Secondary | ICD-10-CM | POA: Insufficient documentation

## 2022-10-08 DIAGNOSIS — I10 Essential (primary) hypertension: Secondary | ICD-10-CM | POA: Diagnosis not present

## 2022-10-08 NOTE — Patient Instructions (Signed)
Moderate Conscious Sedation, Adult Sedation is the use of medicines to promote relaxation and to relieve discomfort and anxiety. Moderate conscious sedation is a type of sedation. Under moderate conscious sedation, you are less alert than normal, but you are still able to respond to instructions, touch, or both. Moderate conscious sedation is used during short medical and dental procedures. It is milder than deep sedation, which is a type of sedation under which you cannot be easily woken up. It is also milder than general anesthesia, which is the use of medicines to make you unconscious. Moderate conscious sedation allows you to return to your regular activities sooner. Tell a health care provider about: Any allergies you have. All medicines you are taking, including vitamins, herbs, eye drops, creams, and over-the-counter medicines. Any use of steroids. This includes steroids taken by mouth or as a cream. Any problems you or family members have had with sedatives and anesthetic medicines. Any blood disorders you have. Any surgeries you have had. Any medical conditions you have, such as sleep apnea. Whether you are pregnant or may be pregnant. Any use of cigarettes, alcohol, marijuana, or drugs. What are the risks? Generally, this is a safe procedure. However, problems may occur, including: Getting too much medicine (oversedation). Nausea. Allergic reaction to medicines. Trouble breathing. If this happens, a breathing tube may be used. It will be removed when you are awake and breathing on your own. Heart trouble. Lung trouble. Confusion that gets better with time (emergence delirium). What happens before the procedure? Staying hydrated Follow instructions from your health care provider about hydration, which may include: Up to 2 hours before the procedure - you may continue to drink clear liquids, such as water, clear fruit juice, black coffee, and plain tea. Eating and drinking  restrictions Follow instructions from your health care provider about eating and drinking, which may include: 8 hours before the procedure - stop eating heavy meals or foods, such as meat, fried foods, or fatty foods. 6 hours before the procedure - stop eating light meals or foods, such as toast or cereal. 6 hours before the procedure - stop drinking milk or drinks that contain milk. 2 hours before the procedure - stop drinking clear liquids. Medicines Ask your health care provider about: Changing or stopping your regular medicines. This is especially important if you are taking diabetes medicines or blood thinners. Taking medicines such as aspirin and ibuprofen. These medicines can thin your blood. Do not take these medicines unless your health care provider tells you to take them. Taking over-the-counter medicines, vitamins, herbs, and supplements. Tests and exams You will have a physical exam. You may have blood tests done to show how well: Your kidneys and liver work. Your blood clots. General instructions Plan to have a responsible adult take you home from the hospital or clinic. If you will be going home right after the procedure, plan to have a responsible adult care for you for the time you are told. This is important. What happens during the procedure?  You will be given the sedative. The sedative may be given: As a pill that you will swallow. It can also be inserted into the rectum. As a spray through the nose. As an injection into the muscle. As an injection into the vein through an IV. You may be given oxygen as needed. Your breathing, heart rate, and blood pressure will be monitored during the procedure. The medical or dental procedure will be done. The procedure may vary among health  care providers and hospitals. What happens after the procedure? Your blood pressure, heart rate, breathing rate, and blood oxygen level will be monitored until you leave the hospital or  clinic. You will get fluids through your IV if needed. Do not drive or operate machinery until your health care provider says that it is safe. Summary Sedation is the use of medicines to promote relaxation and to relieve discomfort and anxiety. Moderate conscious sedation is a type of sedation that is used during short medical and dental procedures. Tell the health care provider about any medical conditions that you have and about all the medicines that you are taking. You will be given the sedative as a pill, a spray through the nose, an injection into the muscle, or an injection into the vein through an IV. Vital signs are monitored during the sedation. Moderate conscious sedation allows you to return to your regular activities sooner. This information is not intended to replace advice given to you by your health care provider. Make sure you discuss any questions you have with your health care provider. Document Revised: 12/10/2019 Document Reviewed: 07/08/2019 Elsevier Patient Education  [place]  What are the risk, side effects and possible complications? Generally speaking, most procedures are safe.  However, with any procedure there are risks, side effects, and the possibility of complications.  The risks and complications are dependent upon the sites that are lesioned, or the type of nerve block to be performed.  The closer the procedure is to the spine, the more serious the risks are.  Great care is taken when placing the radio frequency needles, block needles or lesioning probes, but sometimes complications can occur. Infection: Any time there is an injection through the skin, there is a risk of infection.  This is why sterile conditions are used for these blocks.  There are four possible types of infection. Localized skin infection. Central Nervous System Infection-This can be in the form of Meningitis, which can be deadly. Epidural  Infections-This can be in the form of an epidural abscess, which can cause pressure inside of the spine, causing compression of the spinal cord with subsequent paralysis. This would require an emergency surgery to decompress, and there are no guarantees that the patient would recover from the paralysis. Discitis-This is an infection of the intervertebral discs.  It occurs in about 1% of discography procedures.  It is difficult to treat and it may lead to surgery.        2. Pain: the needles have to go through skin and soft tissues, will cause soreness.       3. Damage to internal structures:  The nerves to be lesioned may be near blood vessels or    other nerves which can be potentially damaged.       4. Bleeding: Bleeding is more common if the patient is taking blood thinners such as  aspirin, Coumadin, Ticiid, Plavix, etc., or if he/she have some genetic predisposition  such as hemophilia. Bleeding into the spinal canal can cause compression of the spinal  cord with subsequent paralysis.  This would require an emergency surgery to  decompress and there are no guarantees that the patient would recover from the  paralysis.       5. Pneumothorax:  Puncturing of a lung is a possibility, every time a needle is introduced in  the area of the chest or upper back.  Pneumothorax refers to free air around the  collapsed lung(s),  inside of the thoracic cavity (chest cavity).  Another two possible  complications related to a similar event would include: Hemothorax and Chylothorax.   These are variations of the Pneumothorax, where instead of air around the collapsed  lung(s), you may have blood or chyle, respectively.       6. Spinal headaches: They may occur with any procedures in the area of the spine.       7. Persistent CSF (Cerebro-Spinal Fluid) leakage: This is a rare problem, but may occur  with prolonged intrathecal or epidural catheters either due to the formation of a fistulous  track or a dural tear.        8. Nerve damage: By working so close to the spinal cord, there is always a possibility of  nerve damage, which could be as serious as a permanent spinal cord injury with  paralysis.       9. Death:  Although rare, severe deadly allergic reactions known as "Anaphylactic  reaction" can occur to any of the medications used.      10. Worsening of the symptoms:  We can always make thing worse.  What are the chances of something like this happening? Chances of any of this occuring are extremely low.  By statistics, you have more of a chance of getting killed in a motor vehicle accident: while driving to the hospital than any of the above occurring .  Nevertheless, you should be aware that they are possibilities.  In general, it is similar to taking a shower.  Everybody knows that you can slip, hit your head and get killed.  Does that mean that you should not shower again?  Nevertheless always keep in mind that statistics do not mean anything if you happen to be on the wrong side of them.  Even if a procedure has a 1 (one) in a 1,000,000 (million) chance of going wrong, it you happen to be that one..Also, keep in mind that by statistics, you have more of a chance of having something go wrong when taking medications.  Who should not have this procedure? If you are on a blood thinning medication (e.g. Coumadin, Plavix, see list of "Blood Thinners"), or if you have an active infection going on, you should not have the procedure.  If you are taking any blood thinners, please inform your physician.  How should I prepare for this procedure? Do not eat or drink anything at least six hours prior to the procedure. Bring a driver with you .  It cannot be a taxi. Come accompanied by an adult that can drive you back, and that is strong enough to help you if your legs get weak or numb from the local anesthetic. Take all of your medicines the morning of the procedure with just enough water to swallow them. If you have  diabetes, make sure that you are scheduled to have your procedure done first thing in the morning, whenever possible. If you have diabetes, take only half of your insulin dose and notify our nurse that you have done so as soon as you arrive at the clinic. If you are diabetic, but only take blood sugar pills (oral hypoglycemic), then do not take them on the morning of your procedure.  You may take them after you have had the procedure. Do not take aspirin or any aspirin-containing medications, at least eleven (11) days prior to the procedure.  They may prolong bleeding. Wear loose fitting clothing that may be easy to take  off and that you would not mind if it got stained with Betadine or blood. Do not wear any jewelry or perfume Remove any nail coloring.  It will interfere with some of our monitoring equipment.  NOTE: Remember that this is not meant to be interpreted as a complete list of all possible complications.  Unforeseen problems may occur.  BLOOD THINNERS The following drugs contain aspirin or other products, which can cause increased bleeding during surgery and should not be taken for 2 weeks prior to and 1 week after surgery.  If you should need take something for relief of minor pain, you may take acetaminophen which is found in Tylenol,m Datril, Anacin-3 and Panadol. It is not blood thinner. The products listed below are.  Do not take any of the products listed below in addition to any listed on your instruction sheet.  A.P.C or A.P.C with Codeine Codeine Phosphate Capsules #3 Ibuprofen Ridaura  ABC compound Congesprin Imuran rimadil  Advil Cope Indocin Robaxisal  Alka-Seltzer Effervescent Pain Reliever and Antacid Coricidin or Coricidin-D  Indomethacin Rufen  Alka-Seltzer plus Cold Medicine Cosprin Ketoprofen S-A-C Tablets  Anacin Analgesic Tablets or Capsules Coumadin Korlgesic Salflex  Anacin Extra Strength Analgesic tablets or capsules CP-2 Tablets Lanoril Salicylate  Anaprox  Cuprimine Capsules Levenox Salocol  Anexsia-D Dalteparin Magan Salsalate  Anodynos Darvon compound Magnesium Salicylate Sine-off  Ansaid Dasin Capsules Magsal Sodium Salicylate  Anturane Depen Capsules Marnal Soma  APF Arthritis pain formula Dewitt's Pills Measurin Stanback  Argesic Dia-Gesic Meclofenamic Sulfinpyrazone  Arthritis Bayer Timed Release Aspirin Diclofenac Meclomen Sulindac  Arthritis pain formula Anacin Dicumarol Medipren Supac  Analgesic (Safety coated) Arthralgen Diffunasal Mefanamic Suprofen  Arthritis Strength Bufferin Dihydrocodeine Mepro Compound Suprol  Arthropan liquid Dopirydamole Methcarbomol with Aspirin Synalgos  ASA tablets/Enseals Disalcid Micrainin Tagament  Ascriptin Doan's Midol Talwin  Ascriptin A/D Dolene Mobidin Tanderil  Ascriptin Extra Strength Dolobid Moblgesic Ticlid  Ascriptin with Codeine Doloprin or Doloprin with Codeine Momentum Tolectin  Asperbuf Duoprin Mono-gesic Trendar  Aspergum Duradyne Motrin or Motrin IB Triminicin  Aspirin plain, buffered or enteric coated Durasal Myochrisine Trigesic  Aspirin Suppositories Easprin Nalfon Trillsate  Aspirin with Codeine Ecotrin Regular or Extra Strength Naprosyn Uracel  Atromid-S Efficin Naproxen Ursinus  Auranofin Capsules Elmiron Neocylate Vanquish  Axotal Emagrin Norgesic Verin  Azathioprine Empirin or Empirin with Codeine Normiflo Vitamin E  Azolid Emprazil Nuprin Voltaren  Bayer Aspirin plain, buffered or children's or timed BC Tablets or powders Encaprin Orgaran Warfarin Sodium  Buff-a-Comp Enoxaparin Orudis Zorpin  Buff-a-Comp with Codeine Equegesic Os-Cal-Gesic   Buffaprin Excedrin plain, buffered or Extra Strength Oxalid   Bufferin Arthritis Strength Feldene Oxphenbutazone   Bufferin plain or Extra Strength Feldene Capsules Oxycodone with Aspirin   Bufferin with Codeine Fenoprofen Fenoprofen Pabalate or Pabalate-SF   Buffets II Flogesic Panagesic   Buffinol plain or Extra Strength Florinal  or Florinal with Codeine Panwarfarin   Buf-Tabs Flurbiprofen Penicillamine   Butalbital Compound Four-way cold tablets Penicillin   Butazolidin Fragmin Pepto-Bismol   Carbenicillin Geminisyn Percodan   Carna Arthritis Reliever Geopen Persantine   Carprofen Gold's salt Persistin   Chloramphenicol Goody's Phenylbutazone   Chloromycetin Haltrain Piroxlcam   Clmetidine heparin Plaquenil   Cllnoril Hyco-pap Ponstel   Clofibrate Hydroxy chloroquine Propoxyphen         Before stopping any of these medications, be sure to consult the physician who ordered them.  Some, such as Coumadin (Warfarin) are ordered to prevent or treat serious conditions such as "deep thrombosis", "pumonary embolisms", and other  heart problems.  The amount of time that you may need off of the medication may also vary with the medication and the reason for which you were taking it.  If you are taking any of these medications, please make sure you notify your pain physician before you undergo any procedures.         Epidural Steroid Injection Patient Information  Description: The epidural space surrounds the nerves as they exit the spinal cord.  In some patients, the nerves can be compressed and inflamed by a bulging disc or a tight spinal canal (spinal stenosis).  By injecting steroids into the epidural space, we can bring irritated nerves into direct contact with a potentially helpful medication.  These steroids act directly on the irritated nerves and can reduce swelling and inflammation which often leads to decreased pain.  Epidural steroids may be injected anywhere along the spine and from the neck to the low back depending upon the location of your pain.   After numbing the skin with local anesthetic (like Novocaine), a small needle is passed into the epidural space slowly.  You may experience a sensation of pressure while this is being done.  The entire block usually last less than 10 minutes.  Conditions which may  be treated by epidural steroids:  Low back and leg pain Neck and arm pain Spinal stenosis Post-laminectomy syndrome Herpes zoster (shingles) pain Pain from compression fractures  Preparation for the injection:  Do not eat any solid food or dairy products within 8 hours of your appointment.  You may drink clear liquids up to 3 hours before appointment.  Clear liquids include water, black coffee, juice or soda.  No milk or cream please. You may take your regular medication, including pain medications, with a sip of water before your appointment  Diabetics should hold regular insulin (if taken separately) and take 1/2 normal NPH dos the morning of the procedure.  Carry some sugar containing items with you to your appointment. A driver must accompany you and be prepared to drive you home after your procedure.  Bring all your current medications with your. An IV may be inserted and sedation may be given at the discretion of the physician.   A blood pressure cuff, EKG and other monitors will often be applied during the procedure.  Some patients may need to have extra oxygen administered for a short period. You will be asked to provide medical information, including your allergies, prior to the procedure.  We must know immediately if you are taking blood thinners (like Coumadin/Warfarin)  Or if you are allergic to IV iodine contrast (dye). We must know if you could possible be pregnant.  Possible side-effects: Bleeding from needle site Infection (rare, may require surgery) Nerve injury (rare) Numbness & tingling (temporary) Difficulty urinating (rare, temporary) Spinal headache ( a headache worse with upright posture) Light -headedness (temporary) Pain at injection site (several days) Decreased blood pressure (temporary) Weakness in arm/leg (temporary) Pressure sensation in back/neck (temporary)  Call if you experience: Fever/chills associated with headache or increased back/neck  pain. Headache worsened by an upright position. New onset weakness or numbness of an extremity below the injection site Hives or difficulty breathing (go to the emergency room) Inflammation or drainage at the infection site Severe back/neck pain Any new symptoms which are concerning to you  Please note:  Although the local anesthetic injected can often make your back or neck feel good for several hours after the injection, the pain will likely  return.  It takes 3-7 days for steroids to work in the epidural space.  You may not notice any pain relief for at least that one week.  If effective, we will often do a series of three injections spaced 3-6 weeks apart to maximally decrease your pain.  After the initial series, we generally will wait several months before considering a repeat injection of the same type.  If you have any questions, please call [phone] [facility]

## 2022-10-08 NOTE — Progress Notes (Signed)
Patient: Christine Higgins  Service Category: E/M  Provider: [name], MD  DOB: 09/11/1955  DOS: 10/08/2022  Referring Provider: Sharlet Salina, MD  MRN: [id number]  Setting: Ambulatory outpatient  PCP: Kirk Ruths, MD  Type: New Patient  Specialty: Interventional Pain Management    Location: Office  Delivery: Face-to-face     Primary Reason(s) for Visit: Encounter for initial evaluation of one or more chronic problems (new to examiner) potentially causing chronic pain, and posing a threat to normal musculoskeletal function. (Level of risk: High) CC: Neck Pain and Back Pain (upper)  HPI  [name] is a 67 y.o. year old, female patient, who comes for the first time to our practice referred by Sharlet Salina, MD for our initial evaluation of her chronic pain. She has Leg pain; Failed back surgical syndrome; DDD (degenerative disc disease), lumbosacral; Status post total replacement of right hip; Severe obesity (BMI 35.0-35.9 with comorbidity) ([place]); Right carpal tunnel syndrome; Primary osteoarthritis of right knee; Pelvic pain in female; Osteoporosis; Migraines; Impingement syndrome of shoulder, left; Hyperlipidemia, unspecified; HTN, goal below 140/90; Hemorrhoids; Health care maintenance; Graves' orbitopathy; GERD (gastroesophageal reflux disease); DM II (diabetes mellitus, type II), controlled ([place]); Depression, major, in remission ([place]); Bilateral arm weakness; Atherosclerosis of abdominal aorta ([place]); Dysuria; Chronic pain syndrome; Fibromyalgia; Status post total left knee replacement; Tachycardia; History of lumbar laminectomy for spinal cord decompression (L3-L4, 2015); Lumbar radiculopathy; Status post reverse arthroplasty of shoulder, left; Chronic midline thoracic back pain; Chronic pain; Piriformis syndrome of both sides; Back pain at L4-L5 level; Generalized anxiety disorder; History of colon polyps; Multiple thyroid nodules; Neutropenia ([place]); Polyarthralgia; Primary insomnia;  Primary osteoarthritis involving multiple joints; Psoriasis; Psoriatic arthritis ([place]); Urticaria, chronic; Cervical spondylosis; Lumbar spondylosis; Spinal stenosis of lumbar region; Foraminal stenosis of cervical region; Total knee replacement status; Arthritis of sacroiliac joint of both sides; Failed spinal cord stimulator ([place]); Primary osteoarthritis of left hip; Spondylolisthesis of lumbar region; Epidural hematoma ([place]); Mild intermittent asthma with acute exacerbation; Controlled diabetes mellitus type 2 with complications ([place]); Bilateral carpal tunnel syndrome; S/P total knee arthroplasty, right; Cervical radicular pain (R>L); and S/P cervical spinal fusion on their problem list. Today she comes in for evaluation of her Neck Pain and Back Pain (upper)  Pain Assessment: Location:   Neck Radiating: through shoulders and upper back down arms bilat to thumbs. Onset: More than a month ago Duration: Chronic pain Quality: Stabbing, Aching, Throbbing Severity: 9 /10 (subjective, self-reported pain score)  Effect on ADL: limits daily activities Timing: Constant Modifying factors: ice and heat BP: (!) 140/90  HR: 80  Onset and Duration: Gradual and Present longer than 3 months Cause of pain: Unknown Severity: Getting worse, NAS-11 at its worse: 9/10, NAS-11 at its best: 7/10, NAS-11 now: 9/10, and NAS-11 on the average: 9/10 Timing: Not influenced by the time of the day, During activity or exercise, and After activity or exercise Aggravating Factors: Bending, Climbing, Eating, Kneeling, Lifiting, Motion, Prolonged sitting, Twisting, Walking, Walking uphill, Walking downhill, and Working Alleviating Factors: Stretching, Cold packs, Hot packs, Lying down, Medications, Resting, Sitting, Sleeping, and Warm showers or baths Associated Problems: Depression, Fatigue, Inability to concentrate, Inability to control bladder (urine), Numbness, Sadness, Spasms, Swelling, Temperature changes, Tingling,  Weakness, Pain that wakes patient up, and Pain that does not allow patient to sleep Quality of Pain: Aching, Annoying, Deep, Disabling, Distressing, Dreadful, Dull, Exhausting, Feeling of constriction, Feeling of weight, Getting longer, Heavy, Horrible, Nagging, Pressure-like, Pulsating, Punishing, Sharp, Shooting, Splitting, Stabbing, Tender, Throbbing,  Tiring, Toothache-like, and Uncomfortable Previous Examinations or Tests: MRI scan Previous Treatments: Stretching exercises  [name] is being evaluated for possible interventional pain management therapies for the treatment of her chronic pain.   [name] presents today with cervical spine pain with radiation into bilateral arms and hands in a dermatomal fashion.  This started after she fell out of a chair at her daughter's house and injured her neck.  This happened towards the end of January.  She has a history of a C5-C6 ACDF.  She had a CT of her cervical spine done which shows C6-C7 foraminal narrowing due to osteophyte complex.  She has tried home directed physical therapy exercises with limited response.  She has limited cervical range of motion particularly to the right.  She states this results in increased right radiating arm pain.  She is currently on Celebrex, acetaminophen, Cymbalta for pain management.  She is status post lumbar spine surgery with Dr. Arnoldo Morale at [facility] neurosurgery.  Meds   Current Outpatient Medications:    albuterol (VENTOLIN HFA) 108 (90 Base) MCG/ACT inhaler, Inhale 2 puffs into the lungs every 4 (four) hours as needed for shortness of breath or wheezing., Disp: , Rfl:    alum & mag hydroxide-simeth (ANTACID LIQUID) 200-200-20 MG/5ML suspension, Take 30 mLs by mouth 4 (four) times daily as needed for indigestion or heartburn. Notify provider if symptoms persist for more than 3 days, Disp: , Rfl:    atorvastatin (LIPITOR) 10 MG tablet, Take 10 mg by mouth daily after supper. , Disp: , Rfl:     budesonide-formoterol (SYMBICORT) 160-4.5 MCG/ACT inhaler, Inhale 2 puffs into the lungs 2 (two) times daily., Disp: , Rfl:    calcium carbonate (OS-CAL) 600 MG TABS tablet, Take 600 mg by mouth every other day., Disp: , Rfl:    celecoxib (CELEBREX) 200 MG capsule, Take 200 mg by mouth 2 (two) times daily., Disp: , Rfl:    Cholecalciferol (VITAMIN D3 SUPER STRENGTH) 50 MCG (2000 UT) TABS, Take 3,000 Units by mouth daily., Disp: , Rfl:    clonazePAM (KLONOPIN) 0.5 MG tablet, Take 0.5 mg by mouth at bedtime as needed for anxiety., Disp: , Rfl:    Coal Tar Extract (NEUTROGENA T/GEL EX), Apply 1 application  topically daily as needed (dandruff)., Disp: , Rfl:    Cyanocobalamin (B-12) 2500 MCG TABS, Take 5,000 mcg by mouth every other day., Disp: , Rfl:    diphenhydrAMINE (BENADRYL) 25 mg capsule, Take 25 mg by mouth 4 (four) times daily as needed for itching or allergies., Disp: , Rfl:    diphenhydrAMINE-zinc acetate (BENADRYL ITCH STOPPING) cream, Apply 1 application  topically 3 (three) times daily as needed for itching., Disp: , Rfl:    docusate sodium (COLACE) 100 MG capsule, Take 1 capsule (100 mg total) by mouth 2 (two) times daily., Disp: 30 capsule, Rfl: 0   doxycycline (VIBRA-TABS) 100 MG tablet, Take 1 tablet (100 mg total) by mouth every 12 (twelve) hours., Disp: 20 tablet, Rfl: 0   DULoxetine (CYMBALTA) 30 MG capsule, Take 30 mg by mouth at bedtime., Disp: , Rfl:    DULoxetine (CYMBALTA) 60 MG capsule, Take 60 mg by mouth daily., Disp: , Rfl:    Emollient (GOLD BOND HEALING) LOTN, Apply 1 application  topically daily. May keep at bedside., Disp: , Rfl:    esomeprazole (NEXIUM) 40 MG capsule, Take 40 mg by mouth 2 (two) times daily. , Disp: , Rfl:    fluticasone (FLONASE) 50 MCG/ACT nasal spray,  Place 2 sprays into both nostrils daily., Disp: , Rfl:    gabapentin (NEURONTIN) 600 MG tablet, Take 1,200 mg by mouth in the morning, at noon, and at bedtime., Disp: , Rfl:    guaiFENesin  (ROBITUSSIN) 100 MG/5ML liquid, Take 15 mLs by mouth every 6 (six) hours as needed for cough., Disp: , Rfl:    HYDROcodone-acetaminophen (NORCO/VICODIN) 5-325 MG tablet, Take 1 tablet by mouth every 6 (six) hours as needed for moderate pain., Disp: , Rfl:    hydrOXYzine (ATARAX) 10 MG tablet, Take 10-30 mg by mouth at bedtime as needed for itching., Disp: , Rfl:    ibandronate (BONIVA) 150 MG tablet, Take 150 mg by mouth every 30 (thirty) days., Disp: , Rfl:    levocetirizine (XYZAL) 5 MG tablet, Take 5 mg by mouth every evening., Disp: , Rfl:    linaclotide (LINZESS) 290 MCG CAPS capsule, Take 290 mcg by mouth daily., Disp: , Rfl:    loperamide (IMODIUM A-D) 2 MG tablet, Take 4 mg by mouth 4 (four) times daily as needed for diarrhea or loose stools., Disp: , Rfl:    losartan (COZAAR) 100 MG tablet, Take 100 mg by mouth daily., Disp: , Rfl:    magnesium hydroxide (MILK OF MAGNESIA) 400 MG/5ML suspension, Take 30 mLs by mouth daily as needed (constipation)., Disp: , Rfl:    menthol-zinc oxide (GOLD BOND) powder, Apply 1 application  topically daily as needed (irritation)., Disp: , Rfl:    methimazole (TAPAZOLE) 5 MG tablet, Take 5 mg by mouth daily., Disp: , Rfl:    methocarbamol (ROBAXIN) 500 MG tablet, Take 1,000 mg by mouth every 12 (twelve) hours as needed for muscle spasms., Disp: , Rfl:    methocarbamol 1000 MG TABS, Take 1,000 mg by mouth 3 (three) times daily., Disp: , Rfl:    Multiple Vitamin (MULTIVITAMIN WITH MINERALS) TABS tablet, Take 1 tablet by mouth daily., Disp: , Rfl:    Naloxone HCl (NARCAN NA), Place into the nose daily as needed., Disp: , Rfl:    OVER THE COUNTER MEDICATION, Apply 1 application  topically See admin instructions. Barrier Cream. Apply topically after toileting as needed to prevent skin breakdown. May be kept in residents room., Disp: , Rfl:    oxyCODONE-acetaminophen (PERCOCET/ROXICET) 5-325 MG tablet, Take 1-2 tablets by mouth every 4 (four) hours as needed for  moderate pain., Disp: 48 tablet, Rfl: 0   potassium chloride (KLOR-CON) 10 MEQ tablet, Take 10 mEq by mouth daily., Disp: , Rfl:    propranolol (INDERAL) 80 MG tablet, Take 80 mg by mouth 2 (two) times daily., Disp: , Rfl:    pyrithione zinc (HEAD AND SHOULDERS) 1 % shampoo, Apply 1 application  topically daily as needed for itching (dandruff)., Disp: , Rfl:    RESTASIS 0.05 % ophthalmic emulsion, Place 1 drop into both eyes 2 (two) times daily., Disp: , Rfl:    Skin Protectants, Misc. (BAZA PROTECT EX), Apply 1 application  topically as needed (skin breakdown)., Disp: , Rfl:    sucralfate (CARAFATE) 1 g tablet, Take 1 g by mouth 4 (four) times daily -  before meals and at bedtime., Disp: , Rfl:    torsemide (DEMADEX) 10 MG tablet, Take 10 mg by mouth daily., Disp: , Rfl:    traZODone (DESYREL) 150 MG tablet, Take 75 mg by mouth at bedtime as needed for sleep. , Disp: , Rfl:    vitamin E 180 MG (400 UNITS) capsule, Take 400 Units by mouth daily., Disp: ,  Rfl:    acetaminophen (TYLENOL) 500 MG tablet, Take 1,000 mg by mouth every 6 (six) hours as needed for mild pain or moderate pain. (Patient not taking: Reported on 10/08/2022), Disp: , Rfl:    predniSONE (DELTASONE) 50 MG tablet, Take 1 tablet (50 mg total) by mouth daily with breakfast. (Patient not taking: Reported on 10/08/2022), Disp: 4 tablet, Rfl: 0  Imaging Review  Cervical Imaging: Cervical MR wo contrast: Results for orders placed during the hospital encounter of 06/09/16  MR Cervical Spine Wo Contrast  Narrative CLINICAL DATA:  Frequent falls with arm numbness. History of neck surgery.  EXAM: MRI CERVICAL SPINE WITHOUT CONTRAST  TECHNIQUE: Multiplanar, multisequence MR imaging of the cervical spine was performed. No intravenous contrast was administered.  COMPARISON:  Cervical spine MRI 08/25/2012  FINDINGS: Alignment: Straightening without subluxation.  Vertebrae: No fracture, evidence of discitis, or bone  lesion.  Cord: Normal signal and morphology.  Posterior Fossa, vertebral arteries, paraspinal tissues: Negative.  Disc levels. Degenerative changes are superimposed on congenital canal narrowing:  C2-3: Facet arthropathy with spurring on the left has progressed from prior. Mild left foraminal stenosis is newly seen. Congenital narrowing of the spinal canal without superimposed degenerative stenosis or cord compression.  C3-4: Left more than right uncovertebral ridging. Left facet spurring. Mild left foraminal narrowing. Congenital narrowing of the spinal canal without cord compression.  C4-5: Left more than right uncovertebral ridging. Facet arthropathy, severe on the left with spurring and sclerosis. Facet arthropathy and left foraminal stenosis is progressed.  C5-6: ACDF. Degree of residual left foraminal stenosis that appears unchanged. Patent canal.  C6-7: Right eccentric disc bulging.  No impingement  C7-T1:Unremarkable.  IMPRESSION: 1. No evidence of myelopathy and no cord compression to explain bilateral arms symptoms. 2. Asymmetric left facet arthropathy that is progressed from 2013. Advanced left foraminal stenosis at C4-5. Mild left foraminal stenosis at C2-3 and C3-4. 3. C5-6 ACDF with residual left foraminal stenosis, stable.   Electronically Signed By: Monte Fantasia M.D. On: 06/09/2016 18:04  Narrative CLINICAL DATA:  Neck trauma (Age >= 65y)  EXAM: CT CERVICAL SPINE WITHOUT CONTRAST  TECHNIQUE: Multidetector CT imaging of the cervical spine was performed without intravenous contrast. Multiplanar CT image reconstructions were also generated.  RADIATION DOSE REDUCTION: This exam was performed according to the departmental dose-optimization program which includes automated exposure control, adjustment of the mA and/or kV according to patient size and/or use of iterative reconstruction technique.  COMPARISON:  12/19/2005  FINDINGS: Alignment:  Normal.  Skull base and vertebrae: No acute fracture. No primary bone lesion or focal pathologic process.  Soft tissues and spinal canal: No prevertebral fluid or swelling. No visible canal hematoma.  Disc levels: Status post ACDF at C5-C6. Artifact from hardware causes degradation of the images. There is narrowing of osteophyte formation noted at the C6-7 level as well as at C4-5.  Upper chest: Negative.  IMPRESSION: Degenerative and postsurgical changes. No acute osseous abnormalities.   Electronically Signed By: Sammie Bench M.D. On: 09/19/2022 15:31   Narrative Clinical Data:  Neck pain, back pain.  Left leg pain and numbness. Right arm pain and weakness.  MYELOGRAM CERVICAL AND LUMBAR  Technique: Lumbar puncture was performed by the attending neurosurgeon at L3-4.Following injection of intrathecal Omnipaque 300, 10 mL contrast, spine imaging in multiple projections was performed using fluoroscopy.  Comparison: MRI lumbar spine 03/2008.  Findings: Moderate multifactorial spinal stenosis at L3-4 secondary to central disc protrusion and posterior element hypertrophy as well as congenital  narrowing of the spinal canal.  There is left L4 nerve root encroachment in the lateral recess.  Contrast was maneuvered into the cervical region by tilting the patient had down 10 degrees for 30 seconds.  AP lateral and oblique views demonstrate moderate stenosis at C5-6 secondary to a congenitally narrow canal and central disc protrusion.  Bilateral C6 nerve root encroachment is evident. Ventral defect is also present at C6-7 with mild effacement both C7 nerve roots  Fluoroscopy Time: 3.05   minutes  IMPRESSION: As above  CT MYELOGRAPHY CERVICAL SPINE  Technique:  CT imaging of the cervical spine was performed after intrathecal contrast administration. Multiplanar CT image reconstructions were also generated.  Comparison: None.  Findings:  C2-3: Mild congenital  stenosis.  No soft disc protrusion  C3-4: Mild congenital stenosis.  No soft disc protrusion or foraminal narrowing  C4-5: Mild congenital stenosis.  No soft disc protrusion or foraminal narrowing  C5-6: Shallow central protrusion.  No definite C6 nerve root encroachment.  Mild congenital stenosis with slight cord flattening.  C6-7: Mild central bulge.  No cord compression or nerve root encroachment  C7-T1: Negative.  IMPRESSION: Two level cervical spondylosis at C5-6 and  C6-7.  Despite the myelographic findings, there is no significant clear-cut lateralizing disc pathology on CT.  It is conceivable that the myelographic defects are related to hyperextension of the neck.  CT MYELOGRAPHY LUMBAR SPINE  Technique: CT imaging of the lumbar spine was performed after intrathecal contrast administration.  Multiplanar CT image reconstructions were also generated.  Comparison: MRI lumbar spine 03/2008  Findings:  No prevertebral or paraspinous masses. Premature atherosclerotic calcification of the aortoiliac system.  L1-2: Normal.  L2-3: Mild bulge.  No stenosis or disc protrusion.  L3-4: Congenital and acquired stenosis with posterior element hypertrophy, central disc protrusion, short pedicles.  Bilateral L4 nerve root encroachment is present, left greater than right.  L4-5: Mild bulge.  Mild facet arthropathy.  No stenosis or disc protrusion.  L5-S1: Mild facet arthropathy.  Mild bulge.  No stenosis or disc protrusion.  Good general agreement with prior MR.  IMPRESSION: Congenital and acquired stenosis most notable at L3-4 where central disc protrusion along the posterior element hypertrophy and short pedicles combine to result in narrowing of the thecal sac and compression of both L4 nerve roots, left greater than right.  Provider: Perfecto Kingdom   Narrative CLINICAL DATA:  Neck pain after fall.  EXAM: CERVICAL SPINE - 2-3 VIEW  COMPARISON:   06/09/2016  FINDINGS: Previous anterior cervical disc fusion at C5-6. The alignment of the cervical spine is normal. The vertebral body heights are well preserved. There is no fracture or dislocation identified. Ventral spurring is noted at C4.  IMPRESSION: 1. No acute findings. 2. Status post ACDF at C5-6.   Electronically Signed By: Kerby Moors M.D. On: 02/04/2018 20:29   Narrative CLINICAL DATA:  Chronic anterior shoulder pain and limited range of motion.  EXAM: MRI OF THE LEFT SHOULDER WITHOUT CONTRAST  TECHNIQUE: Multiplanar, multisequence MR imaging of the shoulder was performed. No intravenous contrast was administered.  COMPARISON:  Left shoulder MRI dated September 05, 2015.  FINDINGS: Rotator cuff: Full-thickness, full width tear of the supraspinatus and infraspinatus tendons. The supraspinatus tendon is retracted approximately 1.6 cm. The infraspinatus tendon is retracted approximately 3 cm. Progressive high-grade partial-thickness, articular surface tear of the subscapularis tendon. The teres minor tendon is intact.  Muscles: New moderate supraspinatus, infraspinatus, and subscapularis muscle atrophy. Mild infraspinatus muscle edema.  Biceps  long head:  Completely torn.  Acromioclavicular Joint: Mild arthropathy of the acromioclavicular joint. Type II acromion. Large subacromial/subdeltoid bursal fluid.  Glenohumeral Joint: Small joint effusion with mild synovitis. Unchanged moderate diffuse cartilage thinning with subchondral cystic change in the glenoid.  Labrum:  Diffusely degenerated.  Bones:  No acute fracture or dislocation.  No focal bone lesion.  Other: None.  IMPRESSION: 1. New full-thickness, full width tears of the supraspinatus and infraspinatus tendons. 2. Progressive high-grade partial-thickness articular surface tear of the subscapularis tendon. 3. New moderate supraspinatus, infraspinatus, and subscapularis muscle  atrophy. 4. New complete tear of the intra-articular biceps tendon. 5. Unchanged moderate glenohumeral mild acromioclavicular osteoarthritis.   Electronically Signed By: Titus Dubin M.D. On: 09/24/2018 15:34   Narrative CLINICAL DATA:  Status post fall.  Shoulder pain.  EXAM: LEFT SHOULDER - 2+ VIEW  COMPARISON:  MR 09/05/2015  FINDINGS: There is no evidence of fracture or dislocation. Mild AC joint and moderate glenohumeral joint osteoarthritis identified. Soft tissues are unremarkable.  IMPRESSION: 1. No acute findings. 2. Osteoarthritis.   Electronically Signed By: Kerby Moors M.D. On: 02/04/2018 18:57    MR THORACIC SPINE WO CONTRAST  Narrative CLINICAL DATA:  Chronic back pain  EXAM: MRI THORACIC AND LUMBAR SPINE WITHOUT CONTRAST  TECHNIQUE: Multiplanar and multiecho pulse sequences of the thoracic and lumbar spine were obtained without intravenous contrast.  COMPARISON:  2019  FINDINGS: MRI THORACIC SPINE  Alignment: Unchanged trace anterolisthesis at T11-T12. Anteroposterior alignment is otherwise maintained.  Vertebrae: No evidence of recent compression deformity. No suspicious osseous lesion. Minor degenerative endplate marrow changes.  Cord:  No abnormal signal.  Paraspinal and other soft tissues: Unremarkable.  Disc levels: Mild multilevel degenerative disc disease and facet arthropathy with ligamentum flavum thickening. Small disc bulges are present throughout. There is moderate canal stenosis at T11-T12 and mild canal stenosis at T10-T11. Canal narrowing is otherwise minor. Neural foraminal narrowing is present at mid lower thoracic levels, greatest on the right at T11-T12 (moderate to marked) and T12-L1 (moderate) similar to prior MRI lumbar spine.  MRI LUMBAR SPINE  Segmentation:  Standard.  Alignment:  No significant anteroposterior listhesis.  Vertebrae: Stable vertebral body heights. No significant marrow edema. No  suspicious osseous lesion.  Conus medullaris and cauda equina: Conus extends to the L1 level. Conus and cauda equina appear normal.  Paraspinal and other soft tissues: Chronic postoperative changes.  Disc levels:  L1-L2:  Facet arthropathy.  No canal or foraminal stenosis.  L2-L3: Disc bulge. Facet arthropathy with ligamentum flavum infolding. Mild foraminal stenosis.  L3-L4: Prior posterior decompression. Disc bulge with left foraminal annular fissure and protrusion. Facet arthropathy. No canal stenosis. Mild right and marked left foraminal stenosis.  L4-L5: Disc bulge. Facet arthropathy with ligamentum flavum infolding. Moderate canal stenosis with narrowing of the lateral recesses. Mild to moderate foraminal stenosis.  L5-S1: Disc bulge. Facet arthropathy with ligamentum flavum infolding. Mild canal stenosis. Mild to moderate foraminal stenosis  IMPRESSION: Multilevel degenerative changes of the thoracic spine. Canal and neural foraminal narrowing are greatest at T11-T12 similar to prior lumbar spine study.  Multilevel degenerative changes of the lumbar spine without significant progression since 2019. Canal stenosis remains greatest at L4-L5. Foraminal stenosis is greatest on the left at L3-L4.   Electronically Signed By: Macy Mis M.D. On: 04/17/2020 12:33   Narrative CLINICAL DATA:  Upper back pain  EXAM: THORACIC SPINE 2 VIEWS  COMPARISON:  MRI 04/17/2020  FINDINGS: Spinal stimulator in place in the midthoracic region.  There is disc space narrowing with anterior spurring noted in the mid and lower thoracic spine. Normal alignment. No fracture. Early endplate sclerosis noted at T4-5.  IMPRESSION: Mild degenerative disc disease in the mid and lower thoracic spine. No acute bony abnormality.   Electronically Signed By: Rolm Baptise M.D. On: 10/25/2020 09:46   MR LUMBAR SPINE WO CONTRAST  Narrative CLINICAL DATA:  Chronic low back pain with  right leg pain, numbness, burning; prior surgery 2014  EXAM: MRI LUMBAR SPINE WITHOUT CONTRAST  TECHNIQUE: Multiplanar, multisequence MR imaging of the lumbar spine was performed. No intravenous contrast was administered.  COMPARISON:  04/17/2020  FINDINGS: Segmentation: Standard. Suspect pseudoarticulation of an elongated right transverse process of L5 with the sacral ala.  Alignment: Mild levocurvature. Redemonstrated trace anterolisthesis T11 on T12. No new listhesis.  Vertebrae:  No acute fracture or suspicious osseous lesion.  Conus medullaris and cauda equina: Conus extends to the L1 level. Conus and cauda equina appear normal.  Paraspinal and other soft tissues: Postsurgical changes. Otherwise negative.  Disc levels:  T12-L1: Seen only on the sagittal images. Minimal disc bulge. No spinal canal stenosis or neural foraminal narrowing.  L1-L2: No significant disc bulge. No spinal canal stenosis or neural foraminal narrowing.  L2-L3: Mild disc bulge. Mild facet arthropathy. No spinal canal stenosis. Mild right neural foraminal narrowing, unchanged.  L3-L4: Prior posterior decompression. Moderate left eccentric disc bulge, with left foraminal protrusion. Facet arthropathy. No spinal canal stenosis. Severe left and moderate right neural foraminal narrowing, unchanged.  L4-L5: Moderate disc bulge. Mild-to-moderate facet arthropathy with ligamentum flavum hypertrophy. Moderate spinal canal stenosis, unchanged. Mild-to-moderate bilateral neural foraminal narrowing, unchanged.  L5-S1: Mild disc bulge. Mild facet arthropathy. Ligamentum flavum hypertrophy. Mild spinal canal stenosis, unchanged. Mild-to-moderate bilateral neural foraminal narrowing, unchanged  IMPRESSION: 1. L4-L5 moderate spinal canal stenosis and mild-to-moderate bilateral neural foraminal narrowing. 2. L5-S1 mild spinal canal stenosis and mild-to-moderate bilateral neural foraminal  narrowing. 3. L3-L4 severe left and moderate right neural foraminal narrowing. 4. L2-L3 mild right neural foraminal narrowing.   Electronically Signed By: Merilyn Baba M.D. On: 10/20/2021 02:46   Narrative CLINICAL DATA:  Chronic left-sided low back pain.  EXAM: MRI LUMBAR SPINE WITHOUT AND WITH CONTRAST  TECHNIQUE: Multiplanar and multiecho pulse sequences of the lumbar spine were obtained without and with intravenous contrast.  CONTRAST:  10 mL Gadavist intravenous contrast.  COMPARISON:  MRI lumbar spine dated June 09, 2016.  FINDINGS: Segmentation:  Standard.  Alignment: Unchanged 2 mm anterolisthesis at T11-T12 and L3-L4. Unchanged trace retrolisthesis at L5-S1.  Vertebrae:  No fracture, evidence of discitis, or bone lesion.  Conus medullaris and cauda equina: Conus extends to the L1 level. Conus and cauda equina appear normal. No intradural enhancement.  Paraspinal and other soft tissues: Negative.  Disc levels:  T11-T12: Unchanged small diffuse disc bulge eccentric to the right. Unchanged mild bilateral facet arthropathy. Unchanged mild spinal canal stenosis and moderate to severe right neuroforaminal stenosis.  T12-L1: Unchanged small diffuse disc bulge and mild bilateral facet arthropathy. Unchanged moderate right and mild left neuroforaminal stenosis. No spinal canal stenosis.  L1-L2:  Negative.  L2-L3: Unchanged small diffuse disc bulge and mild bilateral neuroforaminal stenosis. No spinal canal stenosis.  L3-L4: Prior posterior decompression. Unchanged diffuse disc bulge with superimposed large left foraminal disc protrusion. Unchanged severe left and mild right neuroforaminal stenosis. No spinal canal stenosis.  L4-L5: Unchanged small diffuse disc bulge with new small central and cranial disc extrusion with annular fissure. Unchanged mild  bilateral facet arthropathy and prominent posterior epidural fat. Unchanged moderate spinal canal and  mild left greater than right neuroforaminal stenosis.  L5-S1: Unchanged small broad-based central disc protrusion mild bilateral facet arthropathy. Unchanged mild bilateral neuroforaminal stenosis. No spinal canal stenosis.  IMPRESSION: 1. Unchanged large left foraminal disc protrusion at L3-L4 resulting in severe left neuroforaminal stenosis and impingement on the exiting left L3 nerve root. New mild right neuroforaminal stenosis level. 2. Unchanged moderate to severe right neuroforaminal stenosis at T11-T12. 3. Unchanged moderate spinal canal stenosis at L4-L5. New small central disc extrusion and annular fissure at this level.   Electronically Signed By: Titus Dubin M.D. On: 08/06/2018 13:33  Narrative CLINICAL DATA:  Progressively worsening chronic low back and bilateral leg pain. Remote history of prior lumbar decompression.  EXAM: LUMBAR MYELOGRAM  CT LUMBAR MYELOGRAM  FLUOROSCOPY: Radiation Exposure Index (as provided by the fluoroscopic device): 18.7 mGy Kerma  PROCEDURE: After thorough discussion of risks and benefits of the procedure including bleeding, infection, injury to nerves, blood vessels, adjacent structures as well as headache and CSF leak, written and oral informed consent was obtained. Consent was obtained by Dr. Fabiola Backer. Time out form was completed.  Patient was positioned prone on the fluoroscopy table. Local anesthesia was provided with 1% lidocaine without epinephrine after prepped and draped in the usual sterile fashion. Puncture was performed at L2-L3 using a 5 inch 22-gauge spinal needle via right interlaminar approach. Using a single pass through the dura, the needle was placed within the thecal sac, with return of clear CSF. 15 mL of Isovue M-200 was injected into the thecal sac, with normal opacification of the nerve roots and cauda equina consistent with free flow within the subarachnoid space.  I personally performed the  lumbar puncture and administered the intrathecal contrast. I also personally supervised acquisition of the myelogram images.  TECHNIQUE: Contiguous axial images were obtained through the lumbar spine after the intrathecal infusion of contrast. Coronal and sagittal reconstructions were obtained of the axial image sets.  COMPARISON:  MRI lumbar spine dated October 19, 2021.  FINDINGS: LUMBAR MYELOGRAM FINDINGS:  Prior posterior decompression at L3. Trace anterolisthesis at L3-L4. No dynamic instability. Small ventral extradural defects at T12-L1 and from L2-L3 through L4-L5. The defect at L2-L3 worsens with standing, flexion, and extension. Right lateral recess stenosis at L2-L3 with underfilling of the right L2 nerve root. Moderate spinal canal stenosis at L4-L5. Moderate spinal canal stenosis at L2-L3 when standing.  CT LUMBAR MYELOGRAM FINDINGS:  Segmentation: Standard.  Alignment: Unchanged trace dextrocurvature. Unchanged trace anterolisthesis at L3-L4.  Vertebrae: No acute fracture or other focal pathologic process.  Conus medullaris and cauda equina: Conus extends to the L1-L2 level. Conus and cauda equina appear normal.  Paraspinal and other soft tissues: Aortoiliac atherosclerotic vascular disease. Mild degenerative changes of the bilateral sacroiliac joints.  Disc levels:  T11-T12: Unchanged mild-to-moderate disc bulging eccentric to the right. Unchanged mild bilateral facet arthropathy. Unchanged mild spinal canal and moderate right neuroforaminal stenosis. No left neuroforaminal stenosis.  T12-L1: Unchanged mild disc bulging with superimposed small right paracentral disc protrusion. Unchanged mild-to-moderate right and mild left neuroforaminal stenosis. No spinal canal stenosis.  L1-L2:  Negative.  L2-L3: New large right subarticular disc extrusion versus extruded disc fragment posterior to the L2 vertebral body. Impingement of the right L2 nerve root.  Unchanged mild disc bulging. Worsened moderate to severe right neuroforaminal stenosis. Unchanged mild left neuroforaminal stenosis. No spinal canal stenosis.  L3-L4: Prior posterior decompression. Unchanged  mild-to-moderate disc bulging with superimposed left foraminal disc protrusion. Unchanged mild bilateral facet arthropathy. Unchanged moderate to severe left and moderate right neuroforaminal stenosis. No spinal canal stenosis.  L4-L5: Unchanged moderate disc bulging and mild-to-moderate bilateral facet arthropathy. Unchanged moderate spinal canal stenosis and mild-to-moderate bilateral neuroforaminal stenosis.  L5-S1: Unchanged mild disc bulging. Unchanged mild to moderate bilateral neuroforaminal stenosis. No spinal canal stenosis.  IMPRESSION: 1. New large right subarticular disc extrusion versus extruded disc fragment posterior to the L2 vertebral body with impingement of the right L2 nerve root. Worsened moderate to severe right neuroforaminal stenosis. 2. Dynamic spinal canal stenosis at L2-L3, moderate when standing. 3. Unchanged left foraminal disc protrusion at L3-L4 with moderate to severe neuroforaminal stenosis likely affecting the exiting left L3 nerve root. 4. Unchanged moderate spinal canal stenosis at L4-L5. 5. Aortic Atherosclerosis (ICD10-I70.0).   Electronically Signed By: Titus Dubin M.D. On: 05/15/2022 11:24   Narrative CLINICAL DATA:  Intraoperative imaging for localization  EXAM: LUMBAR SPINE - 1 VIEW  COMPARISON:  CT lumbar spine dated 05/15/2022  FINDINGS: Surgical instruments overlie the level of L3. There is no evidence of lumbar spine fracture. Alignment is normal. Intervertebral disc spaces are maintained.  IMPRESSION: Surgical instruments overlie the level of L3.   Electronically Signed By: Darrin Nipper M.D. On: 06/17/2022 11:44    Narrative CLINICAL DATA:  Pain post fall  EXAM: LUMBAR SPINE - 2-3 VIEW  COMPARISON:   None Available.  FINDINGS: Posterior lumbar fusion at L3-L4 with inter body spacer. No subluxation. No loss of vertebral body height or disc height. Subluxation.  IMPRESSION: 1. No acute findings. 2. Posterior lumbar fusion at L3-L4.   Electronically Signed By: Suzy Bouchard M.D. On: 09/19/2022 15:40    Narrative CLINICAL DATA:  Low back pain  EXAM: LUMBAR SPINE - COMPLETE 4+ VIEW  COMPARISON:  04/14/2016.  MRI 04/17/2020  FINDINGS: Normal alignment. Anterior spurring noted in the lower thoracic spine and throughout the lumbar spine. Early endplate sclerosis noted at L3-4. Diffuse degenerative facet disease, most pronounced at L4-5 and L5-S1 with mild facet hypertrophy. Normal alignment. No fracture. SI joints symmetric and unremarkable.  IMPRESSION: Mild degenerative disc disease and facet disease as above. No acute throughout the lumbar spine bony abnormality.   Electronically Signed By: Rolm Baptise M.D. On: 10/25/2020 09:47   Narrative CLINICAL DATA:  Chronic low back and SI jt pain; needs imaging for pain clinic  EXAM: LUMBAR SPINE - COMPLETE WITH BENDING VIEWS  COMPARISON:  X-ray lumbar spine 10/24/2020  FINDINGS: Markedly limited evaluation due to overlapping osseous structures and overlying soft tissues.  Five non-rib-bearing lumbar vertebral bodies. Multilevel degenerative changes spine. Mild endplate sclerosis at the L3-L4 level again noted. There is no evidence of lumbar spine fracture. Alignment is normal.  Partially visualized total right hip arthroplasty.  IMPRESSION: No acute displaced fracture or traumatic listhesis of the lumbar spine.   Electronically Signed By: Iven Finn M.D. On: 07/17/2021 23:35  Narrative CLINICAL DATA:  Progressively worsening chronic low back and bilateral leg pain. Remote history of prior lumbar decompression.  EXAM: LUMBAR MYELOGRAM  CT LUMBAR MYELOGRAM  FLUOROSCOPY: Radiation Exposure  Index (as provided by the fluoroscopic device): 18.7 mGy Kerma  PROCEDURE: After thorough discussion of risks and benefits of the procedure including bleeding, infection, injury to nerves, blood vessels, adjacent structures as well as headache and CSF leak, written and oral informed consent was obtained. Consent was obtained by Dr. Fabiola Backer. Time out form was completed.  Patient was positioned prone on the fluoroscopy table. Local anesthesia was provided with 1% lidocaine without epinephrine after prepped and draped in the usual sterile fashion. Puncture was performed at L2-L3 using a 5 inch 22-gauge spinal needle via right interlaminar approach. Using a single pass through the dura, the needle was placed within the thecal sac, with return of clear CSF. 15 mL of Isovue M-200 was injected into the thecal sac, with normal opacification of the nerve roots and cauda equina consistent with free flow within the subarachnoid space.  I personally performed the lumbar puncture and administered the intrathecal contrast. I also personally supervised acquisition of the myelogram images.  TECHNIQUE: Contiguous axial images were obtained through the lumbar spine after the intrathecal infusion of contrast. Coronal and sagittal reconstructions were obtained of the axial image sets.  COMPARISON:  MRI lumbar spine dated October 19, 2021.  FINDINGS: LUMBAR MYELOGRAM FINDINGS:  Prior posterior decompression at L3. Trace anterolisthesis at L3-L4. No dynamic instability. Small ventral extradural defects at T12-L1 and from L2-L3 through L4-L5. The defect at L2-L3 worsens with standing, flexion, and extension. Right lateral recess stenosis at L2-L3 with underfilling of the right L2 nerve root. Moderate spinal canal stenosis at L4-L5. Moderate spinal canal stenosis at L2-L3 when standing.  CT LUMBAR MYELOGRAM FINDINGS:  Segmentation: Standard.  Alignment: Unchanged trace dextrocurvature.  Unchanged trace anterolisthesis at L3-L4.  Vertebrae: No acute fracture or other focal pathologic process.  Conus medullaris and cauda equina: Conus extends to the L1-L2 level. Conus and cauda equina appear normal.  Paraspinal and other soft tissues: Aortoiliac atherosclerotic vascular disease. Mild degenerative changes of the bilateral sacroiliac joints.  Disc levels:  T11-T12: Unchanged mild-to-moderate disc bulging eccentric to the right. Unchanged mild bilateral facet arthropathy. Unchanged mild spinal canal and moderate right neuroforaminal stenosis. No left neuroforaminal stenosis.  T12-L1: Unchanged mild disc bulging with superimposed small right paracentral disc protrusion. Unchanged mild-to-moderate right and mild left neuroforaminal stenosis. No spinal canal stenosis.  L1-L2:  Negative.  L2-L3: New large right subarticular disc extrusion versus extruded disc fragment posterior to the L2 vertebral body. Impingement of the right L2 nerve root. Unchanged mild disc bulging. Worsened moderate to severe right neuroforaminal stenosis. Unchanged mild left neuroforaminal stenosis. No spinal canal stenosis.  L3-L4: Prior posterior decompression. Unchanged mild-to-moderate disc bulging with superimposed left foraminal disc protrusion. Unchanged mild bilateral facet arthropathy. Unchanged moderate to severe left and moderate right neuroforaminal stenosis. No spinal canal stenosis.  L4-L5: Unchanged moderate disc bulging and mild-to-moderate bilateral facet arthropathy. Unchanged moderate spinal canal stenosis and mild-to-moderate bilateral neuroforaminal stenosis.  L5-S1: Unchanged mild disc bulging. Unchanged mild to moderate bilateral neuroforaminal stenosis. No spinal canal stenosis.  IMPRESSION: 1. New large right subarticular disc extrusion versus extruded disc fragment posterior to the L2 vertebral body with impingement of the right L2 nerve root. Worsened moderate  to severe right neuroforaminal stenosis. 2. Dynamic spinal canal stenosis at L2-L3, moderate when standing. 3. Unchanged left foraminal disc protrusion at L3-L4 with moderate to severe neuroforaminal stenosis likely affecting the exiting left L3 nerve root. 4. Unchanged moderate spinal canal stenosis at L4-L5. 5. Aortic Atherosclerosis (ICD10-I70.0).   Electronically Signed By: Titus Dubin M.D. On: 05/15/2022 11:24   Narrative CLINICAL DATA:  CT abdomen pelvis 07/31/2020  EXAM: BILATERAL SACROILIAC JOINTS - 3+ VIEW  COMPARISON:  None.  FINDINGS: The sacroiliac joint spaces are maintained and there is no evidence of severe arthropathy. No other bone abnormalities are seen.  Partially visualized total right hip  arthroplasty.  IMPRESSION: Negative.   Electronically Signed By: Iven Finn M.D. On: 07/17/2021 23:37 MR KNEE RIGHT WO CONTRAST  Narrative CLINICAL DATA:  Acute right knee pain with painful range of motion.  EXAM: MRI OF THE RIGHT KNEE WITHOUT CONTRAST  TECHNIQUE: Multiplanar, multisequence MR imaging of the knee was performed. No intravenous contrast was administered.  COMPARISON:  None.  FINDINGS: MENISCI  Medial meniscus: There is a complex tear of the posterior horn including a longitudinal peripheral tear best seen on series 16. There is blunting of the adjacent free edge.  Lateral meniscus: There is a complex tear of the anterior horn including a horizontal component extending into the midbody. There is hypertrophy and degeneration of the posterior horn.  LIGAMENTS  Cruciates:  Degenerated but intact.  Collaterals:  Normal.  CARTILAGE  Patellofemoral: Complete denuding of the articular cartilage of the patella and trochlear groove. Marked irregularity of the articular surface of the patella. Very shallow trochlear groove.  Medial: Focal thinning of the articular cartilage of the posterior aspect of the tibial plateau and of  the posterior aspect of the femoral condyle.  Lateral: Full-thickness cartilage loss of the posterior central aspect of the tibial plateau and of the posterior central aspect of the femoral condyle.  Joint: Large joint effusion with extensive debris in the joint, particularly in the suprapatellar recess.  Popliteal Fossa: No Baker's cyst. However, there are extensive complex cysts posterior to the distal femur in the midline and posterior to the posterior cruciate ligament. Intact popliteus tendon.  Extensor Mechanism:  Intact.  Bones: Prominent tricompartmental marginal osteophytes. Complex 15 mm subcortical cyst in the posterior lateral aspect of the medial tibial plateau with a smaller adjacent cyst in the posteromedial aspect of the lateral tibial plateau.  Other: None  IMPRESSION: 1. Severe osteoarthritis of the patellofemoral and lateral compartments. 2. Complex tears of the anterior horn and midbody of the lateral meniscus and of the posterior horn of the medial meniscus. 3. Large joint effusion with extensive debris in the joint. 4. Multiple complex ganglion cysts posterior to the knee joint in the midline.   Electronically Signed By: Lorriane Shire M.D. On: 05/18/2018 08:09    Narrative CLINICAL DATA:  Worsening leg swelling up to recent surgery.  EXAM: RIGHT KNEE - 1-2 VIEW  COMPARISON:  x-ray right knee 02/05/2021  FINDINGS: No cortical erosion or destruction. No acute displaced fracture or dislocation of the knee. Prior external fixation osseous changes noted.  At least small knee joint effusion noted.  Interval increase in subcutaneus soft tissue edema overlying a right knee total arthroplasty. No surgical hardware fracture or surrounding lucency. Skin staples are again noted.  IMPRESSION: Interval increase in subcutaneus soft tissue edema and likely small joint effusion in a patient status post right knee arthroplasty. Findings concerning  for infection.   Electronically Signed By: Iven Finn M.D. On: 02/15/2021 05:18   Complexity Note: Imaging results reviewed.                         ROS  Cardiovascular: Abnormal heart rhythm Pulmonary or Respiratory: No reported pulmonary signs or symptoms such as wheezing and difficulty taking a deep full breath (Asthma), difficulty blowing air out (Emphysema), coughing up mucus (Bronchitis), persistent dry cough, or temporary stoppage of breathing during sleep Neurological: No reported neurological signs or symptoms such as seizures, abnormal skin sensations, urinary and/or fecal incontinence, being born with an abnormal open spine and/or a tethered  spinal cord Psychological-Psychiatric: Anxiousness and Depressed Gastrointestinal: Reflux or heatburn Genitourinary: No reported renal or genitourinary signs or symptoms such as difficulty voiding or producing urine, peeing blood, non-functioning kidney, kidney stones, difficulty emptying the bladder, difficulty controlling the flow of urine, or chronic kidney disease Hematological: No reported hematological signs or symptoms such as prolonged bleeding, low or poor functioning platelets, bruising or bleeding easily, hereditary bleeding problems, low energy levels due to low hemoglobin or being anemic Endocrine: High blood sugar controlled without the use of insulin (NIDDM) Rheumatologic: Joint aches and or swelling due to excess weight (Osteoarthritis) and Generalized muscle aches (Fibromyalgia) Musculoskeletal: Negative for myasthenia gravis, muscular dystrophy, multiple sclerosis or malignant hyperthermia Work History: Disabled  Allergies  [name] is allergic to cephalexin, ibuprofen, potassium chloride, shellfish allergy, and aspirin.  Laboratory Chemistry Profile   Renal Lab Results  Component Value Date   BUN 9 09/19/2022   CREATININE 0.71 09/19/2022   BCR 11 (L) 04/27/2020   GFRAA 83 04/27/2020   GFRNONAA >60 09/19/2022    SPECGRAV <1.005 (L) 05/08/2022   PHUR 6.5 05/08/2022   PROTEINUR NEGATIVE 06/19/2022     Electrolytes Lab Results  Component Value Date   NA 133 (L) 09/19/2022   K 3.8 09/19/2022   CL 100 09/19/2022   CALCIUM 9.6 09/19/2022   MG 1.9 08/27/2014     Hepatic Lab Results  Component Value Date   AST 21 09/19/2022   ALT 20 09/19/2022   ALBUMIN 4.7 09/19/2022   ALKPHOS 97 09/19/2022   LIPASE 27 12/14/2016     ID Lab Results  Component Value Date   SARSCOV2NAA NEGATIVE 08/06/2022   STAPHAUREUS NEGATIVE 06/10/2022   MRSAPCR NEGATIVE 06/10/2022     Bone No results found for: "VD25OH", "VD125OH2TOT", "[id number]", "[id number]", "25OHVITD1", "25OHVITD2", "25OHVITD3", "TESTOFREE", "TESTOSTERONE"   Endocrine Lab Results  Component Value Date   GLUCOSE 140 (H) 09/19/2022   GLUCOSEU NEGATIVE 06/19/2022   HGBA1C 6.2 (H) 01/25/2021   TSH 5.274 (H) 08/18/2017   FREET4 0.83 08/18/2017     Neuropathy Lab Results  Component Value Date   HGBA1C 6.2 (H) 01/25/2021     CNS No results found for: "COLORCSF", "APPEARCSF", "RBCCOUNTCSF", "WBCCSF", "POLYSCSF", "LYMPHSCSF", "EOSCSF", "PROTEINCSF", "GLUCCSF", "JCVIRUS", "CSFOLI", "IGGCSF", "LABACHR", "ACETBL"   Inflammation (CRP: Acute  ESR: Chronic) Lab Results  Component Value Date   CRP 0.5 01/25/2021   ESRSEDRATE 6 01/25/2021     Rheumatology No results found for: "RF", "ANA", "LABURIC", "URICUR", "LYMEIGGIGMAB", "LYMEABIGMQN", "HLAB27"   Coagulation Lab Results  Component Value Date   INR 0.9 01/25/2021   LABPROT 12.2 01/25/2021   APTT 31 01/25/2021   PLT 231 09/19/2022   DDIMER 1.65 (H) 07/22/2022     Cardiovascular Lab Results  Component Value Date   TROPONINI <0.03 06/09/2016   HGB 11.9 (L) 09/19/2022   HCT 38.0 09/19/2022     Screening Lab Results  Component Value Date   SARSCOV2NAA NEGATIVE 08/06/2022   STAPHAUREUS NEGATIVE 06/10/2022   MRSAPCR NEGATIVE 06/10/2022     Cancer No results found for:  "CEA", "CA125", "LABCA2"   Allergens No results found for: "ALMOND", "APPLE", "ASPARAGUS", "AVOCADO", "BANANA", "BARLEY", "BASIL", "BAYLEAF", "GREENBEAN", "LIMABEAN", "WHITEBEAN", "BEEFIGE", "REDBEET", "BLUEBERRY", "BROCCOLI", "CABBAGE", "MELON", "CARROT", "CASEIN", "CASHEWNUT", "CAULIFLOWER", "CELERY"     Note: Lab results reviewed.  [place]  Drug: [name]  reports that she does not currently use drugs. Alcohol:  reports no history of alcohol use. Tobacco:  reports that she quit smoking about 30 years ago. Her  smoking use included cigarettes. She has never used smokeless tobacco. Medical:  has a past medical history of Anxiety, Asthma, Breast mass, Cervical disc disease, Chronic pain syndrome, COPD (chronic obstructive pulmonary disease) ([place]), Degenerative disc disease, lumbar, Depression, Diabetes mellitus without complication ([place]), Fibromyalgia, Fibromyalgia, GERD (gastroesophageal reflux disease), Glaucoma, Graves disease, Headache, Hemorrhoids, History of hiatal hernia, Hyperlipidemia, Hypertension, Hyperthyroidism, Lumbar disc disease, Obesity, Pre-diabetes, Psoriatic arthritis ([place]), and Thyroid disease. Family: family history includes Cancer in her mother; Heart disease in her father.  Past Surgical History:  Procedure Laterality Date   BACK SURGERY     sumbar   CARDIAC CATHETERIZATION  2006   carpel tunn Right    carpel tunnel Left    CATARACT EXTRACTION Bilateral 2022   CESAREAN SECTION     COLONOSCOPY     COLONOSCOPY WITH PROPOFOL N/A 10/20/2020   Procedure: COLONOSCOPY WITH PROPOFOL;  Surgeon: Lesly Rubenstein, MD;  Location: [facility] ENDOSCOPY;  Service: Endoscopy;  Laterality: N/A;   ESOPHAGOGASTRODUODENOSCOPY (EGD) WITH PROPOFOL N/A 06/26/2017   Procedure: ESOPHAGOGASTRODUODENOSCOPY (EGD) WITH PROPOFOL;  Surgeon: Lollie Sails, MD;  Location: [facility] ENDOSCOPY;  Service: Endoscopy;  Laterality: N/A;   EXCISION MORTON'S NEUROMA Left 02/05/2017   Procedure: EXCISION  MORTON'S NEUROMA;  Surgeon: Samara Deist, DPM;  Location: [facility];  Service: Podiatry;  Laterality: Left;  iva with local   FRACTURE SURGERY  1969   Left Elbow   HAND SURGERY Right    scar tissue removal   JOINT REPLACEMENT     right hip arthroplasty 08/25/15   KNEE ARTHROPLASTY Left 08/11/2017   Procedure: COMPUTER ASSISTED TOTAL KNEE ARTHROPLASTY;  Surgeon: Dereck Leep, MD;  Location: [facility] ORS;  Service: Orthopedics;  Laterality: Left;   KNEE ARTHROPLASTY Right 02/05/2021   Procedure: COMPUTER ASSISTED TOTAL KNEE ARTHROPLASTY;  Surgeon: Dereck Leep, MD;  Location: [facility];  Service: Orthopedics;  Laterality: Right;   KNEE ARTHROSCOPY Right 07/02/2018   Procedure: ARTHROSCOPY KNEE, Medial and Lateral  Chondroplasty;  Surgeon: Dereck Leep, MD;  Location: [facility];  Service: Orthopedics;  Laterality: Right;   LUMBAR LAMINECTOMY/DECOMPRESSION MICRODISCECTOMY N/A 06/19/2022   Procedure: EXPLORE LUMBAR WOUND FOR EVACUATION OF HEMATOMA;  Surgeon: Newman Pies, MD;  Location: [facility];  Service: Neurosurgery;  Laterality: N/A;   NECK SURGERY     "disk implant"   REVERSE SHOULDER ARTHROPLASTY Left 06/24/2019   Procedure: REVERSE SHOULDER ARTHROPLASTY;  Surgeon: Corky Mull, MD;  Location: [facility];  Service: Orthopedics;  Laterality: Left;   SHOULDER SURGERY Right    spur removal   SPINAL CORD STIMULATOR REMOVAL N/A 04/09/2021   Procedure: REMOVAL SPINAL CORD STIMULATOR & PULSE GENERATOR;  Surgeon: [name], MD;  Location: [facility];  Service: Neurosurgery;  Laterality: N/A;  2nd case   THORACIC LAMINECTOMY FOR SPINAL CORD STIMULATOR N/A 08/07/2020   Procedure: THORACIC SPINAL CORD STIMULATOR VIA LAMINECTOMY, PULSE GENERATOR;  Surgeon: [name], MD;  Location: [facility];  Service: Neurosurgery;  Laterality: N/A;   TOTAL HIP ARTHROPLASTY Right    TUBAL LIGATION     Active Ambulatory Problems    Diagnosis Date Noted   Leg pain 07/28/2015   Failed back  surgical syndrome 10/30/2015   DDD (degenerative disc disease), lumbosacral 10/30/2015   Status post total replacement of right hip 08/21/2015   Severe obesity (BMI 35.0-35.9 with comorbidity) ([place]) 08/09/2014   Right carpal tunnel syndrome 03/27/2017   Primary osteoarthritis of right knee 10/18/2016   Pelvic pain in female 06/24/2017   Osteoporosis  02/03/2014   Migraines 02/03/2014   Impingement syndrome of shoulder, left 10/20/2014   Hyperlipidemia, unspecified 02/03/2014   HTN, goal below 140/90 03/05/2017   Hemorrhoids 03/14/2014   Health care maintenance 06/21/2015   Graves' orbitopathy 02/17/2017   GERD (gastroesophageal reflux disease) 02/03/2014   DM II (diabetes mellitus, type II), controlled ([place]) 04/19/2014   Depression, major, in remission ([place]) 12/21/2015   Bilateral arm weakness 07/16/2016   Atherosclerosis of abdominal aorta ([place]) 07/29/2014   Dysuria 06/24/2017   Chronic pain syndrome 07/30/2017   Fibromyalgia 07/30/2017   Status post total left knee replacement 08/11/2017   Tachycardia 08/18/2017   History of lumbar laminectomy for spinal cord decompression (L3-L4, 2015) 02/25/2019   Lumbar radiculopathy 02/25/2019   Status post reverse arthroplasty of shoulder, left 06/24/2019   Chronic midline thoracic back pain 03/27/2020   Chronic pain 08/07/2020   Piriformis syndrome of both sides 01/02/2021   Back pain at L4-L5 level 02/13/2016   Generalized anxiety disorder 06/23/2017   History of colon polyps 07/09/2019   Multiple thyroid nodules 10/01/2019   Neutropenia ([place]) 02/16/2019   Polyarthralgia 02/02/2019   Primary insomnia 06/23/2017   Primary osteoarthritis involving multiple joints 02/16/2019   Psoriasis 12/22/2019   Psoriatic arthritis ([place]) 12/22/2019   Urticaria, chronic 07/14/2019   Cervical spondylosis 09/14/2018   Lumbar spondylosis 06/23/2017   Spinal stenosis of lumbar region 03/22/2016   Foraminal stenosis of cervical region 06/23/2017    Total knee replacement status 02/05/2021   Arthritis of sacroiliac joint of both sides 04/24/2021   Failed spinal cord stimulator ([place]) 04/24/2021   Primary osteoarthritis of left hip 09/12/2021   Spondylolisthesis of lumbar region 06/17/2022   Epidural hematoma ([place]) 06/19/2022   Mild intermittent asthma with acute exacerbation 08/13/2018   Controlled diabetes mellitus type 2 with complications ([place]) 123456   Bilateral carpal tunnel syndrome 06/20/2021   S/P total knee arthroplasty, right 03/25/2021   Cervical radicular pain (R>L) 10/08/2022   S/P cervical spinal fusion 10/08/2022   Resolved Ambulatory Problems    Diagnosis Date Noted   No Resolved Ambulatory Problems   Past Medical History:  Diagnosis Date   Anxiety    Asthma    Breast mass    Cervical disc disease    COPD (chronic obstructive pulmonary disease) (HCC)    Degenerative disc disease, lumbar    Depression    Diabetes mellitus without complication ([place])    Glaucoma    Graves disease    Headache    History of hiatal hernia    Hyperlipidemia    Hypertension    Hyperthyroidism    Lumbar disc disease    Obesity    Pre-diabetes    Thyroid disease    Constitutional Exam  General appearance: Well nourished, well developed, and well hydrated. In no apparent acute distress Vitals:   10/08/22 1101 10/08/22 1117  BP: (!) 150/87 (!) 140/90  Pulse: 80   Resp: 18   Temp: 97.8 F (36.6 C)   TempSrc: Temporal   SpO2: 99%   Weight: 269 lb (122 kg)   Height: 5' 11"$  (1.803 m)    BMI Assessment: Estimated body mass index is 37.52 kg/m as calculated from the following:   Height as of this encounter: 5' 11"$  (1.803 m).   Weight as of this encounter: 269 lb (122 kg).  BMI interpretation table: BMI level Category Range association with higher incidence of chronic pain  <18 kg/m2 Underweight   18.5-24.9 kg/m2 Ideal body weight  25-29.9 kg/m2 Overweight Increased incidence by 20%  30-34.9 kg/m2 Obese (Class I)  Increased incidence by 68%  35-39.9 kg/m2 Severe obesity (Class II) Increased incidence by 136%  >40 kg/m2 Extreme obesity (Class III) Increased incidence by 254%   Patient's current BMI Ideal Body weight  Body mass index is 37.52 kg/m. Ideal body weight: 70.8 kg (156 lb 1.4 oz) Adjusted ideal body weight: 91.3 kg (201 lb 4 oz)   BMI Readings from Last 4 Encounters:  10/08/22 37.52 kg/m  09/19/22 37.66 kg/m  08/06/22 29.99 kg/m  07/25/22 38.49 kg/m   Wt Readings from Last 4 Encounters:  10/08/22 269 lb (122 kg)  09/19/22 270 lb (122.5 kg)  08/06/22 215 lb (97.5 kg)  07/25/22 276 lb (125.2 kg)    Psych/Mental status: Alert, oriented x 3 (person, place, & time)       Eyes: PERLA Respiratory: No evidence of acute respiratory distress  Cervical Spine Area Exam  Skin & Axial Inspection: No masses, redness, edema, swelling, or associated skin lesions Alignment: Symmetrical Functional ROM: Pain restricted ROM, bilaterally Stability: No instability detected Muscle Tone/Strength: Functionally intact. No obvious neuro-muscular anomalies detected. Sensory (Neurological): Dermatomal pain pattern Palpation: No palpable anomalies              Upper Extremity (UE) Exam    Side: Right upper extremity  Side: Left upper extremity  Skin & Extremity Inspection: Skin color, temperature, and hair growth are WNL. No peripheral edema or cyanosis. No masses, redness, swelling, asymmetry, or associated skin lesions. No contractures.  Skin & Extremity Inspection: Skin color, temperature, and hair growth are WNL. No peripheral edema or cyanosis. No masses, redness, swelling, asymmetry, or associated skin lesions. No contractures.  Functional ROM: Unrestricted ROM          Functional ROM: Unrestricted ROM          Muscle Tone/Strength: Functionally intact. No obvious neuro-muscular anomalies detected.  Muscle Tone/Strength: Functionally intact. No obvious neuro-muscular anomalies detected.  Sensory  (Neurological): Dermatomal pain pattern          Sensory (Neurological): Dermatomal pain pattern          Palpation: No palpable anomalies              Palpation: No palpable anomalies              Provocative Test(s):  Phalen's test: deferred Tinel's test: deferred Apley's scratch test (touch opposite shoulder):  Action 1 (Across chest): deferred Action 2 (Overhead): deferred Action 3 (LB reach): deferred   Provocative Test(s):  Phalen's test: deferred Tinel's test: deferred Apley's scratch test (touch opposite shoulder):  Action 1 (Across chest): deferred Action 2 (Overhead): deferred Action 3 (LB reach): deferred    5 out of 5 strength bilateral lower extremity: Plantar flexion, dorsiflexion, knee flexion, knee extension.   Assessment  Primary Diagnosis & Pertinent Problem List: The primary encounter diagnosis was Cervical radicular pain (R>L). Diagnoses of S/P cervical spinal fusion and Foraminal stenosis of cervical region were also pertinent to this visit.  Visit Diagnosis (New problems to examiner): 1. Cervical radicular pain (R>L)   2. S/P cervical spinal fusion   3. Foraminal stenosis of cervical region    Plan of Care (Initial workup plan)  Reviewed CT scan with the patient of her cervical spine.  She does have foraminal stenosis at C6-C7 below her fusion hardware.  Discussed cervical epidural steroid injection.  Risks and benefits reviewed and patient would like to proceed. Continue multimodal analgesics  as prescribed. Continue follow-up care with neurosurgery status post lumbar fusion.    Procedure Orders         Cervical Epidural Injection     PProvider-requested follow-up: Return in about 2 weeks (around 10/22/2022) for C-ESI , in clinic (PO Valium).  Future Appointments  Date Time Provider [place]  10/15/2022 10:00 AM [facility] None  02/18/2023  1:30 PM Dew, Erskine Squibb, MD [facility] None    Duration of encounter: 73mnutes.  Total  time on encounter, as per AMA guidelines included both the face-to-face and non-face-to-face time personally spent by the physician and/or other qualified health care professional(s) on the day of the encounter (includes time in activities that require the physician or other qualified health care professional and does not include time in activities normally performed by clinical staff). Physician's time may include the following activities when performed: Preparing to see the patient (e.g., pre-charting review of records, searching for previously ordered imaging, lab work, and nerve conduction tests) Review of prior analgesic pharmacotherapies. Reviewing PMP Interpreting ordered tests (e.g., lab work, imaging, nerve conduction tests) Performing post-procedure evaluations, including interpretation of diagnostic procedures Obtaining and/or reviewing separately obtained history Performing a medically appropriate examination and/or evaluation Counseling and educating the patient/family/caregiver Ordering medications, tests, or procedures Referring and communicating with other health care professionals (when not separately reported) Documenting clinical information in the electronic or other health record Independently interpreting results (not separately reported) and communicating results to the patient/ family/caregiver Care coordination (not separately reported)  Note by: BGillis Santa MD Date: 10/08/2022; Time: 1:38 PM

## 2022-10-08 NOTE — Progress Notes (Signed)
Safety precautions to be maintained throughout the outpatient stay will include: orient to surroundings, keep bed in low position, maintain call bell within reach at all times, provide assistance with transfer out of bed and ambulation.   Pt advised to notify PCP of elevated BP.

## 2022-10-15 ENCOUNTER — Other Ambulatory Visit: Payer: Self-pay

## 2022-10-15 ENCOUNTER — Encounter
Admission: RE | Admit: 2022-10-15 | Discharge: 2022-10-15 | Disposition: A | Discharge status: Home / Self Care | Payer: Medicare HMO | Source: Ambulatory Visit | Attending: Obstetrics and Gynecology | Admitting: Obstetrics and Gynecology

## 2022-10-15 DIAGNOSIS — Z79899 Other long term (current) drug therapy: Secondary | ICD-10-CM | POA: Diagnosis not present

## 2022-10-15 DIAGNOSIS — M159 Polyosteoarthritis, unspecified: Secondary | ICD-10-CM | POA: Diagnosis not present

## 2022-10-15 DIAGNOSIS — L409 Psoriasis, unspecified: Secondary | ICD-10-CM | POA: Diagnosis not present

## 2022-10-15 DIAGNOSIS — M797 Fibromyalgia: Secondary | ICD-10-CM | POA: Diagnosis not present

## 2022-10-15 DIAGNOSIS — L4050 Arthropathic psoriasis, unspecified: Secondary | ICD-10-CM | POA: Diagnosis not present

## 2022-10-15 NOTE — Patient Instructions (Signed)
Your procedure is scheduled on: Thursday 10/24/22 Report to the Registration Desk on the 1st floor of the [facility]. To find out your arrival time, please call [phone] between 1PM - 3PM on: Wednesday 10/23/22 If your arrival time is 6:00 am, do not arrive before that time as the [facility] entrance doors do not open until 6:00 am.  REMEMBER: Instructions that are not followed completely may result in serious medical risk, up to and including death; or upon the discretion of your surgeon and anesthesiologist your surgery may need to be rescheduled.  Do not eat food after midnight the night before surgery.  No gum chewing or hard candies.  You may however, drink CLEAR liquids up to 2 hours before you are scheduled to arrive for your surgery. Do not drink anything within 2 hours of your scheduled arrival time.  Clear liquids include: - water  - apple juice without pulp - gatorade (not RED colors) - black coffee or tea (Do NOT add milk or creamers to the coffee or tea) Do NOT drink anything that is not on this list. .  In addition, your doctor has ordered for you to drink the provided:  Ensure Pre-Surgery Clear Carbohydrate Drink you can pick this up when your come for your lab work on Thursday 10/17/22  Drinking this carbohydrate drink up to two hours before surgery helps to reduce insulin resistance and improve patient outcomes. Please complete drinking 2 hours before scheduled arrival time.  One week prior to surgery: Stop Anti-inflammatories (NSAIDS) such as Advil, Aleve, Ibuprofen, Motrin, Naproxen, Naprosyn and Aspirin based products such as Excedrin, Goody's Powder, BC Powder. Stop ANY OVER THE COUNTER supplements until after surgery. You may however, continue to take Tylenol if needed for pain up until the day of surgery.  Continue taking all prescribed medications with the exception of the following: n/a .  TAKE ONLY THESE MEDICATIONS THE MORNING OF SURGERY WITH A SIP  OF WATER:  DULoxetine (CYMBALTA) 60 MG capsule  esomeprazole (NEXIUM) 40 MG capsule  Antacid (take one the night before and one on the morning of surgery - helps to prevent nausea after surgery.) 3.   gabapentin (NEURONTIN) 600 MG tablet  4.   methimazole (TAPAZOLE) 5 MG tablet  5.   propranolol (INDERAL) 80 MG tablet  Christine Higgins may use her Flonase and Restasis   Use inhalers on the day of surgery and bring to the hospital.  No Alcohol for 24 hours before or after surgery.  No Smoking including e-cigarettes for 24 hours before surgery.  No chewable tobacco products for at least 6 hours before surgery.  No nicotine patches on the day of surgery.  Do not use any "recreational" drugs for at least a week (preferably 2 weeks) before your surgery.  Please be advised that the combination of cocaine and anesthesia may have negative outcomes, up to and including death. If you test positive for cocaine, your surgery will be cancelled.  On the morning of surgery brush your teeth with toothpaste and water, you may rinse your mouth with mouthwash if you wish. Do not swallow any toothpaste or mouthwash.  Use CHG Soap or wipes as directed on instruction sheet.  Do not wear jewelry, make-up, hairpins, clips or nail polish.  Do not wear lotions, powders, or perfumes.   Do not shave body hair from the neck down 48 hours before surgery.  Contact lenses, hearing aids and dentures may not be worn into surgery.  Do not  bring valuables to the hospital. [facility] is not responsible for any missing/lost belongings or valuables.    Notify your doctor if there is any change in your medical condition (cold, fever, infection).  Wear comfortable clothing (specific to your surgery type) to the hospital.  After surgery, you can help prevent lung complications by doing breathing exercises.  Take deep breaths and cough every 1-2 hours. Your doctor may order a device called an Incentive Spirometer to help  you take deep breaths. When coughing or sneezing, hold a pillow firmly against your incision with both hands. This is called "splinting." Doing this helps protect your incision. It also decreases belly discomfort.  If you are being admitted to the hospital overnight, leave your suitcase in the car. After surgery it may be brought to your room.  In case of increased patient census, it may be necessary for you, the patient, to continue your postoperative care in the Same Day Surgery department.  If you are being discharged the day of surgery, you will not be allowed to drive home. You will need a responsible individual to drive you home and stay with you for 24 hours after surgery.   If you are taking public transportation, you will need to have a responsible individual with you.  Please call the [facility]. at [phone] if you have any questions about these instructions.  Surgery Visitation Policy:  Patients undergoing a surgery or procedure may have two family members or support persons with them as long as the person is not COVID-19 positive or experiencing its symptoms.   Inpatient Visitation:    Visiting hours are 7 a.m. to 8 p.m. Up to four visitors are allowed at one time in a patient room. The visitors may rotate out with other people during the day. One designated support person (adult) may remain overnight.  Due to an increase in RSV and influenza rates and associated hospitalizations, children ages 41 and under will not be able to visit patients in [facility]. Masks continue to be strongly recommended.

## 2022-10-17 ENCOUNTER — Inpatient Hospital Stay: Admission: RE | Admit: 2022-10-17 | Payer: Medicare HMO | Source: Ambulatory Visit

## 2022-10-17 DIAGNOSIS — E041 Nontoxic single thyroid nodule: Secondary | ICD-10-CM | POA: Diagnosis not present

## 2022-10-17 DIAGNOSIS — I10 Essential (primary) hypertension: Secondary | ICD-10-CM | POA: Diagnosis not present

## 2022-10-17 DIAGNOSIS — F325 Major depressive disorder, single episode, in full remission: Secondary | ICD-10-CM | POA: Diagnosis not present

## 2022-10-17 DIAGNOSIS — Z6839 Body mass index (BMI) 39.0-39.9, adult: Secondary | ICD-10-CM | POA: Diagnosis not present

## 2022-10-17 DIAGNOSIS — I700 Atherosclerosis of aorta: Secondary | ICD-10-CM | POA: Diagnosis not present

## 2022-10-17 DIAGNOSIS — L4050 Arthropathic psoriasis, unspecified: Secondary | ICD-10-CM | POA: Diagnosis not present

## 2022-10-17 DIAGNOSIS — E042 Nontoxic multinodular goiter: Secondary | ICD-10-CM | POA: Diagnosis not present

## 2022-10-17 DIAGNOSIS — E118 Type 2 diabetes mellitus with unspecified complications: Secondary | ICD-10-CM | POA: Diagnosis not present

## 2022-10-20 DIAGNOSIS — Z1211 Encounter for screening for malignant neoplasm of colon: Secondary | ICD-10-CM | POA: Diagnosis not present

## 2022-10-22 ENCOUNTER — Encounter
Admission: RE | Admit: 2022-10-22 | Discharge: 2022-10-22 | Disposition: A | Discharge status: Home / Self Care | Payer: Medicare HMO | Source: Ambulatory Visit | Attending: Obstetrics and Gynecology | Admitting: Obstetrics and Gynecology

## 2022-10-22 ENCOUNTER — Other Ambulatory Visit: Payer: Self-pay | Admitting: Student

## 2022-10-22 DIAGNOSIS — M4316 Spondylolisthesis, lumbar region: Secondary | ICD-10-CM | POA: Diagnosis not present

## 2022-10-22 DIAGNOSIS — M5416 Radiculopathy, lumbar region: Secondary | ICD-10-CM

## 2022-10-22 DIAGNOSIS — Z01818 Encounter for other preprocedural examination: Secondary | ICD-10-CM

## 2022-10-22 DIAGNOSIS — Z96651 Presence of right artificial knee joint: Secondary | ICD-10-CM | POA: Diagnosis not present

## 2022-10-22 DIAGNOSIS — Z01812 Encounter for preprocedural laboratory examination: Secondary | ICD-10-CM | POA: Insufficient documentation

## 2022-10-22 LAB — CBC
HCT: 34 % — ABNORMAL LOW (ref 36.0–46.0)
Hemoglobin: 10.8 g/dL — ABNORMAL LOW (ref 12.0–15.0)
MCH: 24.1 pg — ABNORMAL LOW (ref 26.0–34.0)
MCHC: 31.8 g/dL (ref 30.0–36.0)
MCV: 75.9 fL — ABNORMAL LOW (ref 80.0–100.0)
Platelets: 151 10*3/uL (ref 150–400)
RBC: 4.48 MIL/uL (ref 3.87–5.11)
RDW: 17.9 % — ABNORMAL HIGH (ref 11.5–15.5)
WBC: 4.9 10*3/uL (ref 4.0–10.5)
nRBC: 0 % (ref 0.0–0.2)

## 2022-10-22 LAB — TYPE AND SCREEN
ABO/RH(D): B POS
Antibody Screen: NEGATIVE

## 2022-10-22 LAB — BASIC METABOLIC PANEL
Anion gap: 12 (ref 5–15)
BUN: 10 mg/dL (ref 8–23)
CO2: 24 mmol/L (ref 22–32)
Calcium: 9.1 mg/dL (ref 8.9–10.3)
Chloride: 95 mmol/L — ABNORMAL LOW (ref 98–111)
Creatinine, Ser: 0.78 mg/dL (ref 0.44–1.00)
GFR, Estimated: >60 mL/min (ref >60)
Glucose, Bld: 155 mg/dL — ABNORMAL HIGH (ref 70–99)
Potassium: 3.3 mmol/L — ABNORMAL LOW (ref 3.5–5.1)
Sodium: 131 mmol/L — ABNORMAL LOW (ref 135–145)

## 2022-10-23 DIAGNOSIS — E119 Type 2 diabetes mellitus without complications: Secondary | ICD-10-CM | POA: Diagnosis not present

## 2022-10-23 MED ORDER — LACTATED RINGERS IV SOLN: INTRAVENOUS | Status: DC

## 2022-10-23 MED ORDER — POVIDONE-IODINE 10 % EX SWAB: 2.0000 | Freq: Once | CUTANEOUS | Status: DC

## 2022-10-23 MED ORDER — CHLORHEXIDINE GLUCONATE 0.12 % MT SOLN: 15.0000 mL | Freq: Once | OROMUCOSAL | Status: AC

## 2022-10-23 MED ORDER — GABAPENTIN 300 MG PO CAPS: 300.0000 mg | ORAL_CAPSULE | ORAL | Status: AC

## 2022-10-23 MED ORDER — DOXYCYCLINE HYCLATE 100 MG IV SOLR
200.0000 mg | INTRAVENOUS | Status: AC
  Administered 2022-10-24: 200 mg via INTRAVENOUS
  Filled 2022-10-23: qty 200

## 2022-10-23 MED ORDER — ORAL CARE MOUTH RINSE: 15.0000 mL | Freq: Once | OROMUCOSAL | Status: AC

## 2022-10-23 MED ORDER — ACETAMINOPHEN 500 MG PO TABS: 1000.0000 mg | ORAL_TABLET | ORAL | Status: AC

## 2022-10-24 ENCOUNTER — Other Ambulatory Visit: Payer: Self-pay

## 2022-10-24 ENCOUNTER — Encounter
Admission: RE | Disposition: A | Discharge status: Home / Self Care | Payer: Self-pay | Source: Home / Self Care | Attending: Obstetrics and Gynecology

## 2022-10-24 ENCOUNTER — Ambulatory Visit: Payer: Medicare HMO | Admitting: Urgent Care

## 2022-10-24 ENCOUNTER — Encounter: Payer: Self-pay | Admitting: Obstetrics and Gynecology

## 2022-10-24 ENCOUNTER — Ambulatory Visit
Admission: RE | Admit: 2022-10-24 | Discharge: 2022-10-24 | Disposition: A | Discharge status: Home / Self Care | Payer: Medicare HMO | Attending: Obstetrics and Gynecology | Admitting: Obstetrics and Gynecology

## 2022-10-24 DIAGNOSIS — I509 Heart failure, unspecified: Secondary | ICD-10-CM | POA: Insufficient documentation

## 2022-10-24 DIAGNOSIS — E0500 Thyrotoxicosis with diffuse goiter without thyrotoxic crisis or storm: Secondary | ICD-10-CM | POA: Diagnosis not present

## 2022-10-24 DIAGNOSIS — L4050 Arthropathic psoriasis, unspecified: Secondary | ICD-10-CM | POA: Diagnosis not present

## 2022-10-24 DIAGNOSIS — F32A Depression, unspecified: Secondary | ICD-10-CM | POA: Diagnosis not present

## 2022-10-24 DIAGNOSIS — Z87891 Personal history of nicotine dependence: Secondary | ICD-10-CM | POA: Insufficient documentation

## 2022-10-24 DIAGNOSIS — F419 Anxiety disorder, unspecified: Secondary | ICD-10-CM | POA: Diagnosis not present

## 2022-10-24 DIAGNOSIS — J449 Chronic obstructive pulmonary disease, unspecified: Secondary | ICD-10-CM | POA: Insufficient documentation

## 2022-10-24 DIAGNOSIS — Z79899 Other long term (current) drug therapy: Secondary | ICD-10-CM | POA: Diagnosis not present

## 2022-10-24 DIAGNOSIS — N950 Postmenopausal bleeding: Secondary | ICD-10-CM | POA: Diagnosis not present

## 2022-10-24 DIAGNOSIS — N858 Other specified noninflammatory disorders of uterus: Secondary | ICD-10-CM | POA: Insufficient documentation

## 2022-10-24 DIAGNOSIS — Z6838 Body mass index (BMI) 38.0-38.9, adult: Secondary | ICD-10-CM | POA: Diagnosis not present

## 2022-10-24 DIAGNOSIS — Z96651 Presence of right artificial knee joint: Secondary | ICD-10-CM

## 2022-10-24 DIAGNOSIS — M797 Fibromyalgia: Secondary | ICD-10-CM | POA: Diagnosis not present

## 2022-10-24 DIAGNOSIS — K219 Gastro-esophageal reflux disease without esophagitis: Secondary | ICD-10-CM | POA: Diagnosis not present

## 2022-10-24 DIAGNOSIS — E785 Hyperlipidemia, unspecified: Secondary | ICD-10-CM | POA: Diagnosis not present

## 2022-10-24 DIAGNOSIS — I110 Hypertensive heart disease with heart failure: Secondary | ICD-10-CM | POA: Insufficient documentation

## 2022-10-24 DIAGNOSIS — Z7951 Long term (current) use of inhaled steroids: Secondary | ICD-10-CM | POA: Diagnosis not present

## 2022-10-24 DIAGNOSIS — N888 Other specified noninflammatory disorders of cervix uteri: Secondary | ICD-10-CM | POA: Diagnosis not present

## 2022-10-24 DIAGNOSIS — Z01818 Encounter for other preprocedural examination: Secondary | ICD-10-CM

## 2022-10-24 DIAGNOSIS — E119 Type 2 diabetes mellitus without complications: Secondary | ICD-10-CM | POA: Diagnosis not present

## 2022-10-24 HISTORY — PX: DILATATION & CURETTAGE/HYSTEROSCOPY WITH MYOSURE: SHX6511

## 2022-10-24 LAB — GLUCOSE, CAPILLARY
Glucose-Capillary: 215 mg/dL — ABNORMAL HIGH (ref 70–99)
Glucose-Capillary: 251 mg/dL — ABNORMAL HIGH (ref 70–99)
Glucose-Capillary: 131 mg/dL — ABNORMAL HIGH (ref 70–99)

## 2022-10-24 SURGERY — DILATATION & CURETTAGE/HYSTEROSCOPY WITH MYOSURE
Anesthesia: General

## 2022-10-24 MED ORDER — FENTANYL CITRATE (PF) 100 MCG/2ML IJ SOLN: 25.0000 ug | INTRAMUSCULAR | Status: DC | PRN

## 2022-10-24 MED ORDER — GABAPENTIN 300 MG PO CAPS
ORAL_CAPSULE | ORAL | Status: AC
  Administered 2022-10-24: 300 mg via ORAL
  Filled 2022-10-24: qty 1

## 2022-10-24 MED ORDER — SILVER NITRATE-POT NITRATE 75-25 % EX MISC
CUTANEOUS | Status: DC | PRN
  Administered 2022-10-24: 2 via TOPICAL

## 2022-10-24 MED ORDER — ONDANSETRON HCL 4 MG/2ML IJ SOLN
INTRAMUSCULAR | Status: DC | PRN
  Administered 2022-10-24: 4 mg via INTRAVENOUS

## 2022-10-24 MED ORDER — LIDOCAINE HCL (PF) 2 % IJ SOLN
INTRAMUSCULAR | Status: AC
  Filled 2022-10-24: qty 5

## 2022-10-24 MED ORDER — PHENYLEPHRINE 80 MCG/ML (10ML) SYRINGE FOR IV PUSH (FOR BLOOD PRESSURE SUPPORT)
PREFILLED_SYRINGE | INTRAVENOUS | Status: AC
  Filled 2022-10-24: qty 10

## 2022-10-24 MED ORDER — PROPOFOL 10 MG/ML IV BOLUS
INTRAVENOUS | Status: DC | PRN
  Administered 2022-10-24: 180 mg via INTRAVENOUS

## 2022-10-24 MED ORDER — MIDAZOLAM HCL 2 MG/2ML IJ SOLN
INTRAMUSCULAR | Status: AC
  Filled 2022-10-24: qty 2

## 2022-10-24 MED ORDER — LIDOCAINE HCL (CARDIAC) PF 100 MG/5ML IV SOSY
PREFILLED_SYRINGE | INTRAVENOUS | Status: DC | PRN
  Administered 2022-10-24: 100 mg via INTRAVENOUS

## 2022-10-24 MED ORDER — OXYCODONE HCL 5 MG PO TABS
ORAL_TABLET | ORAL | Status: AC
  Filled 2022-10-24: qty 1

## 2022-10-24 MED ORDER — ACETAMINOPHEN 500 MG PO TABS
ORAL_TABLET | ORAL | Status: AC
  Administered 2022-10-24: 1000 mg via ORAL
  Filled 2022-10-24: qty 2

## 2022-10-24 MED ORDER — SUCCINYLCHOLINE CHLORIDE 200 MG/10ML IV SOSY
PREFILLED_SYRINGE | INTRAVENOUS | Status: AC
  Filled 2022-10-24: qty 10

## 2022-10-24 MED ORDER — DEXMEDETOMIDINE HCL IN NACL 80 MCG/20ML IV SOLN
INTRAVENOUS | Status: AC
  Filled 2022-10-24: qty 20

## 2022-10-24 MED ORDER — SUCCINYLCHOLINE CHLORIDE 200 MG/10ML IV SOSY
PREFILLED_SYRINGE | INTRAVENOUS | Status: DC | PRN
  Administered 2022-10-24: 140 mg via INTRAVENOUS

## 2022-10-24 MED ORDER — DEXAMETHASONE SODIUM PHOSPHATE 10 MG/ML IJ SOLN
INTRAMUSCULAR | Status: DC | PRN
  Administered 2022-10-24: 5 mg via INTRAVENOUS

## 2022-10-24 MED ORDER — FENTANYL CITRATE (PF) 100 MCG/2ML IJ SOLN
INTRAMUSCULAR | Status: DC | PRN
  Administered 2022-10-24 (×2): 50 ug via INTRAVENOUS

## 2022-10-24 MED ORDER — MIDAZOLAM HCL 2 MG/2ML IJ SOLN
INTRAMUSCULAR | Status: DC | PRN
  Administered 2022-10-24: 2 mg via INTRAVENOUS

## 2022-10-24 MED ORDER — DEXMEDETOMIDINE HCL IN NACL 80 MCG/20ML IV SOLN
INTRAVENOUS | Status: DC | PRN
  Administered 2022-10-24: 8 ug via BUCCAL

## 2022-10-24 MED ORDER — OXYCODONE HCL 5 MG/5ML PO SOLN: 5.0000 mg | Freq: Once | ORAL | Status: AC | PRN

## 2022-10-24 MED ORDER — DEXAMETHASONE SODIUM PHOSPHATE 10 MG/ML IJ SOLN
INTRAMUSCULAR | Status: AC
  Filled 2022-10-24: qty 1

## 2022-10-24 MED ORDER — OXYCODONE HCL 5 MG PO TABS
5.0000 mg | ORAL_TABLET | Freq: Once | ORAL | Status: AC | PRN
  Administered 2022-10-24: 5 mg via ORAL

## 2022-10-24 MED ORDER — CHLORHEXIDINE GLUCONATE 0.12 % MT SOLN
OROMUCOSAL | Status: AC
  Administered 2022-10-24: 15 mL via OROMUCOSAL
  Filled 2022-10-24: qty 15

## 2022-10-24 MED ORDER — ACETAMINOPHEN 500 MG PO TABS
ORAL_TABLET | ORAL | Status: AC
  Filled 2022-10-24: qty 2

## 2022-10-24 MED ORDER — PHENYLEPHRINE HCL (PRESSORS) 10 MG/ML IV SOLN
INTRAVENOUS | Status: DC | PRN
  Administered 2022-10-24 (×3): 80 ug via INTRAVENOUS

## 2022-10-24 MED ORDER — PROPOFOL 10 MG/ML IV BOLUS
INTRAVENOUS | Status: AC
  Filled 2022-10-24: qty 20

## 2022-10-24 MED ORDER — ONDANSETRON HCL 4 MG/2ML IJ SOLN
INTRAMUSCULAR | Status: AC
  Filled 2022-10-24: qty 2

## 2022-10-24 MED ORDER — FENTANYL CITRATE (PF) 100 MCG/2ML IJ SOLN
INTRAMUSCULAR | Status: AC
  Filled 2022-10-24: qty 2

## 2022-10-24 SURGICAL SUPPLY — 26 items
BAG PRESSURE INF REUSE 1000 (BAG) ×2 IMPLANT
DEVICE MYOSURE LITE (MISCELLANEOUS) IMPLANT
DEVICE MYOSURE REACH (MISCELLANEOUS) IMPLANT
DRSG TELFA 3X8 NADH STRL (GAUZE/BANDAGES/DRESSINGS) ×2 IMPLANT
GLOVE SURG SYN 8.0 (GLOVE) ×1 IMPLANT
GLOVE SURG SYN 8.0 PF PI (GLOVE) ×2 IMPLANT
GOWN STRL REUS W/ TWL LRG LVL3 (GOWN DISPOSABLE) ×2 IMPLANT
GOWN STRL REUS W/ TWL XL LVL3 (GOWN DISPOSABLE) ×2 IMPLANT
GOWN STRL REUS W/TWL LRG LVL3 (GOWN DISPOSABLE) ×1
GOWN STRL REUS W/TWL XL LVL3 (GOWN DISPOSABLE) ×1
IV NS 1000ML (IV SOLUTION) ×1
IV NS 1000ML BAXH (IV SOLUTION) ×2 IMPLANT
IV NS IRRIG 3000ML ARTHROMATIC (IV SOLUTION) ×2 IMPLANT
KIT PROCEDURE FLUENT (KITS) IMPLANT
KIT TURNOVER CYSTO (KITS) ×2 IMPLANT
MANIFOLD NEPTUNE II (INSTRUMENTS) ×2 IMPLANT
PACK DNC HYST (MISCELLANEOUS) IMPLANT
PAD OB MATERNITY 4.3X12.25 (PERSONAL CARE ITEMS) ×2 IMPLANT
PAD PREP 24X41 OB/GYN DISP (PERSONAL CARE ITEMS) ×2 IMPLANT
SCRUB CHG 4% DYNA-HEX 4OZ (MISCELLANEOUS) ×2 IMPLANT
SEAL ROD LENS SCOPE MYOSURE (ABLATOR) ×2 IMPLANT
SET CYSTO W/LG BORE CLAMP LF (SET/KITS/TRAYS/PACK) IMPLANT
TOWEL OR 17X26 4PK STRL BLUE (TOWEL DISPOSABLE) ×2 IMPLANT
TRAP FLUID SMOKE EVACUATOR (MISCELLANEOUS) ×2 IMPLANT
TUBING CONNECTING 10 (TUBING) IMPLANT
WATER STERILE IRR 500ML POUR (IV SOLUTION) ×2 IMPLANT

## 2022-10-24 NOTE — Brief Op Note (Signed)
10/24/2022  12:51 PM  PATIENT:  Christine Higgins  67 y.o. female  PRE-OPERATIVE DIAGNOSIS:  postmenopausal bleeding  POST-OPERATIVE DIAGNOSIS:  postmenopausal bleeding Atrophic endometrium  PROCEDURE:  Procedure(s): DILATATION & CURETTAGE/HYSTEROSCOPY (N/A)  SURGEON:  Surgeon(s) and Role:    * Sara Keys, Gwen Her, MD - Primary  PHYSICIAN ASSISTANT: PA student  haduck   ASSISTANTS: none   ANESTHESIA:   general  EBL:  5 mL  IOF 600 cc UO 100 cc  BLOOD ADMINISTERED:none  DRAINS: none   LOCAL MEDICATIONS USED:  NONE  SPECIMEN:  Source of Specimen:  ECC , endometrail curretings   DISPOSITION OF SPECIMEN:  PATHOLOGY  COUNTS:  YES  TOURNIQUET:  * No tourniquets in log *  DICTATION: .Other Dictation: Dictation Number verbal  PLAN OF CARE: Discharge to home after PACU  PATIENT DISPOSITION:  PACU - hemodynamically stable.   Delay start of Pharmacological VTE agent (>24hrs) due to surgical blood loss or risk of bleeding: not applicable

## 2022-10-24 NOTE — Transfer of Care (Signed)
Immediate Anesthesia Transfer of Care Note  Patient: Christine Higgins  Procedure(s) Performed: DILATATION & CURETTAGE/HYSTEROSCOPY  Patient Location: PACU  Anesthesia Type:General  Level of Consciousness: drowsy  Airway & Oxygen Therapy: Patient Spontanous Breathing and Patient connected to face mask oxygen  Post-op Assessment: Report given to RN and Post -op Vital signs reviewed and stable  Post vital signs: Reviewed and stable  Last Vitals:  Vitals Value Taken Time  BP 141/99 10/24/22 1309  Temp 36.1 C 10/24/22 1309  Pulse 73 10/24/22 1313  Resp 13 10/24/22 1314  SpO2 100 % 10/24/22 1313  Vitals shown include unvalidated device data.  Last Pain:  Vitals:   10/24/22 1309  TempSrc:   PainSc: Asleep         Complications: No notable events documented.

## 2022-10-24 NOTE — Progress Notes (Signed)
Pt here for Fx D+c and H/S and possible myosure  Labs reviewed . All questions answered . Proceed

## 2022-10-24 NOTE — Anesthesia Procedure Notes (Signed)
Procedure Name: Intubation Date/Time: 10/24/2022 12:20 PM  Performed by: Cammie Sickle, CRNAPre-anesthesia Checklist: Patient identified, Patient being monitored, Timeout performed, Emergency Drugs available and Suction available Patient Re-evaluated:Patient Re-evaluated prior to induction Oxygen Delivery Method: Circle system utilized Preoxygenation: Pre-oxygenation with 100% oxygen Induction Type: IV induction Ventilation: Mask ventilation without difficulty Laryngoscope Size: 3 and McGraph Grade View: Grade I Tube type: Oral Tube size: 7.0 mm Number of attempts: 1 Airway Equipment and Method: Stylet Placement Confirmation: ETT inserted through vocal cords under direct vision, positive ETCO2 and breath sounds checked- equal and bilateral Secured at: 22 cm Tube secured with: Tape Dental Injury: Teeth and Oropharynx as per pre-operative assessment  Comments: Smooth atraumatic intubation with grade 1 view using Mcgrath videoscope, no complications noted.

## 2022-10-24 NOTE — Discharge Instructions (Signed)

## 2022-10-24 NOTE — Op Note (Signed)
NAME: Beswick, Merced A. MEDICAL RECORD NO: [id number] ACCOUNT NO: [id number] DATE OF BIRTH: August 28, 1955 FACILITY: [facility] LOCATION: [facility] PHYSICIAN: Boykin Nearing, MD  Operative Report   DATE OF PROCEDURE: 10/24/2022  PREOPERATIVE DIAGNOSIS:  Postmenopausal bleeding, inability to sample endometrial lining in the office.  POSTOPERATIVE DIAGNOSIS:  Postmenopausal bleeding, inability to sample endometrial lining in the office.  PROCEDURE: 1.  Fractional dilation and curettage. 2.  Hysteroscopy.  SURGEON:  Boykin Nearing, MD  FIRST ASSISTANT:  PA student, Haduck  ANESTHESIA:  General endotracheal anesthesia.  INDICATIONS:  67 year old female with postmenopausal bleeding and inability to sample the endometrial lining in the office.  DESCRIPTION OF PROCEDURE:  After adequate general endotracheal anesthesia, the patient was placed in dorsal supine position with the legs in the candy cane stirrups.  The patient's mons, perineum and vagina were prepped and draped in normal sterile  fashion.  A timeout was performed.  The patient did receive 200 mg doxycycline prior to commencement of the case for surgical prophylaxis.  Straight catheterization of the bladder yielded 100 mL clear urine.  A weighted speculum was placed into the  posterior vaginal vault and the anterior cervix was grasped with a single tooth tenaculum.  An endocervical curettage was performed.  The cervix was dilated with some difficulty and therefore, the tenaculum was placed on the posterior cervical lip and  cervix was dilated to #16 Hanks dilator.  Uterine sounds to 7.5 cm.  The hysteroscope was advanced into the endometrial cavity, which revealed atrophy.  No polyps.  Normal ostia were seen bilaterally for fallopian tubes.  Hysteroscopy was completed and  the cervix was then dilated to #20 Hanks dilator and endometrial curetting was performed with scant tissue removed.  Good hemostasis noted.  Silver nitrate  was applied to the posterior tenaculum sites.  The patient tolerated the procedure well.  ESTIMATED BLOOD LOSS:  Minimal.  INTRAOPERATIVE FLUIDS:  600 mL  URINE OUTPUT:  100 mL  DISPOSITION:  The patient was taken to recovery room in good condition.   PUS D: 10/24/2022 1:15:10 pm T: 10/24/2022 2:04:00 pm  JOB: [id number]

## 2022-10-24 NOTE — Anesthesia Preprocedure Evaluation (Addendum)
Anesthesia Evaluation  Patient identified by MRN, date of birth, ID band Patient awake    Reviewed: Allergy & Precautions, H&P , NPO status , Patient's Chart, lab work & pertinent test results  Airway Mallampati: III  TM Distance: >3 FB Neck ROM: Limited    Dental no notable dental hx. (+) Edentulous Upper, Edentulous Lower, Dental Advisory Given   Pulmonary asthma , COPD,  COPD inhaler, former smoker   Pulmonary exam normal breath sounds clear to auscultation       Cardiovascular Exercise Tolerance: Good hypertension, Pt. on medications and Pt. on home beta blockers +CHF  (-) Past MI and (-) CABG Normal cardiovascular exam Rhythm:Regular Rate:Normal     Neuro/Psych  Headaches  Anxiety Depression    negative neurological ROS  negative psych ROS   GI/Hepatic negative GI ROS, Neg liver ROS, hiatal hernia,GERD  Medicated,,  Endo/Other  diabetes Hyperthyroidism Morbid obesity  Renal/GU negative Renal ROS  negative genitourinary   Musculoskeletal  (+) Arthritis ,  Fibromyalgia -S/p lumbar surgery 10/23 now with hematoma for exploration   Abdominal Normal abdominal exam  (+)   Peds  Hematology negative hematology ROS (+)   Anesthesia Other Findings Past Medical History: No date: Anxiety No date: Asthma No date: Breast mass     Comment:  LEFT x 3 months per pt and palpated by physician No date: Cervical disc disease No date: Chronic pain syndrome No date: COPD (chronic obstructive pulmonary disease) (HCC) No date: Degenerative disc disease, lumbar     Comment:  osteoarthritis No date: Depression No date: Diabetes mellitus without complication (HCC) No date: Fibromyalgia No date: Fibromyalgia No date: GERD (gastroesophageal reflux disease) No date: Glaucoma No date: Graves disease No date: Headache     Comment:  migraines No date: Hemorrhoids No date: History of hiatal hernia No date: Hyperlipidemia No date:  Hypertension No date: Hyperthyroidism No date: Lumbar disc disease No date: Obesity No date: Pre-diabetes No date: Psoriatic arthritis ([place]) No date: Thyroid disease  Past Surgical History: No date: BACK SURGERY     Comment:  sumbar 2006: CARDIAC CATHETERIZATION No date: carpel tunn; Right No date: carpel tunnel; Left 2022: CATARACT EXTRACTION; Bilateral No date: CESAREAN SECTION No date: COLONOSCOPY 10/20/2020: COLONOSCOPY WITH PROPOFOL; N/A     Comment:  Procedure: COLONOSCOPY WITH PROPOFOL;  Surgeon:               Lesly Rubenstein, MD;  Location: [facility] ENDOSCOPY;                Service: Endoscopy;  Laterality: N/A; 06/26/2017: ESOPHAGOGASTRODUODENOSCOPY (EGD) WITH PROPOFOL; N/A     Comment:  Procedure: ESOPHAGOGASTRODUODENOSCOPY (EGD) WITH               PROPOFOL;  Surgeon: Lollie Sails, MD;  Location:               [facility] ENDOSCOPY;  Service: Endoscopy;  Laterality: N/A; 02/05/2017: EXCISION [facility]'S NEUROMA; Left     Comment:  Procedure: EXCISION [facility]'S NEUROMA;  Surgeon: Samara Deist, DPM;  Location: [facility];  Service:               Podiatry;  Laterality: Left;  iva with local 1969: FRACTURE SURGERY     Comment:  Left Elbow No date: HAND SURGERY; Right     Comment:  scar tissue removal No date: JOINT REPLACEMENT     Comment:  right  hip arthroplasty 08/25/15 08/11/2017: KNEE ARTHROPLASTY; Left     Comment:  Procedure: COMPUTER ASSISTED TOTAL KNEE ARTHROPLASTY;                Surgeon: Dereck Leep, MD;  Location: [facility] ORS;                Service: Orthopedics;  Laterality: Left; 02/05/2021: KNEE ARTHROPLASTY; Right     Comment:  Procedure: COMPUTER ASSISTED TOTAL KNEE ARTHROPLASTY;                Surgeon: Dereck Leep, MD;  Location: [facility] ORS;                Service: Orthopedics;  Laterality: Right; 07/02/2018: KNEE ARTHROSCOPY; Right     Comment:  Procedure: ARTHROSCOPY KNEE, Medial and Lateral                Chondroplasty;   Surgeon: Dereck Leep, MD;  Location:               [facility];  Service: Orthopedics;  Laterality: Right; 06/19/2022: LUMBAR LAMINECTOMY/DECOMPRESSION MICRODISCECTOMY; N/A     Comment:  Procedure: EXPLORE LUMBAR WOUND FOR EVACUATION OF               HEMATOMA;  Surgeon: Newman Pies, MD;  Location: [place];  Service: Neurosurgery;  Laterality: N/A; No date: NECK SURGERY     Comment:  "disk implant" 06/24/2019: REVERSE SHOULDER ARTHROPLASTY; Left     Comment:  Procedure: REVERSE SHOULDER ARTHROPLASTY;  Surgeon:               Corky Mull, MD;  Location: [facility] OR[facility];  Service:               Orthopedics;  Laterality: Left; No date: SHOULDER SURGERY; Right     Comment:  spur removal 04/09/2021: SPINAL CORD STIMULATOR REMOVAL; N/A     Comment:  Procedure: REMOVAL SPINAL CORD STIMULATOR & PULSE               GENERATOR;  Surgeon: [name], MD;  Location: [facility]               ORS;  Service: Neurosurgery;  Laterality: N/A;  2nd case 08/07/2020: THORACIC LAMINECTOMY FOR SPINAL CORD STIMULATOR; N/A     Comment:  Procedure: THORACIC SPINAL CORD STIMULATOR VIA               LAMINECTOMY, PULSE GENERATOR;  Surgeon: [name], MD;              Location: [facility] ORS;  Service: Neurosurgery;  Laterality:               N/A; No date: TOTAL HIP ARTHROPLASTY; Right No date: TUBAL LIGATION  BMI    Body Mass Index: 37.66 kg/m      Reproductive/Obstetrics negative OB ROS                              Anesthesia Physical Anesthesia Plan  ASA: 3  Anesthesia Plan: General ETT   Post-op Pain Management:    Induction: Intravenous  PONV Risk Score and Plan: 4 or greater and Ondansetron, Dexamethasone and Midazolam  Airway Management Planned: Oral ETT  Additional Equipment: None  Intra-op Plan:   Post-operative Plan: Extubation in OR  Informed Consent: I have reviewed the patients History and  Physical, chart, labs and discussed the procedure including  the risks, benefits and alternatives for the proposed anesthesia with the patient or authorized representative who has indicated his/her understanding and acceptance.     Dental Advisory Given  Plan Discussed with: Anesthesiologist, CRNA and Surgeon  Anesthesia Plan Comments: (Patient consented for risks of anesthesia including but not limited to:  - adverse reactions to medications - damage to eyes, teeth, lips or other oral mucosa - nerve damage due to positioning  - sore throat or hoarseness - Damage to heart, brain, nerves, lungs, other parts of body or loss of life  Patient voiced understanding.)         Anesthesia Quick Evaluation

## 2022-10-25 NOTE — Anesthesia Postprocedure Evaluation (Addendum)
Anesthesia Post Note  Patient: Christine Higgins  Procedure(s) Performed: DILATATION & CURETTAGE/HYSTEROSCOPY  Patient location during evaluation: PACU Anesthesia Type: General Level of consciousness: awake and alert Pain management: pain level controlled Vital Signs Assessment: post-procedure vital signs reviewed and stable Respiratory status: spontaneous breathing, nonlabored ventilation, respiratory function stable and patient connected to nasal cannula oxygen Cardiovascular status: blood pressure returned to baseline and stable Postop Assessment: no apparent nausea or vomiting Anesthetic complications: no  No notable events documented.   Last Vitals:  Vitals:   10/24/22 1400 10/24/22 1418  BP: (!) 151/78 (!) 154/74  Pulse: 62 67  Resp: 12 15  Temp: (!) 36.1 C 36.6 C  SpO2: 98% 100%    Last Pain:  Vitals:   10/24/22 1418  TempSrc: Temporal  PainSc:                  Dimas Millin

## 2022-10-28 LAB — SURGICAL PATHOLOGY

## 2022-10-30 ENCOUNTER — Ambulatory Visit: Payer: Medicare HMO | Admitting: Student in an Organized Health Care Education/Training Program

## 2022-11-05 ENCOUNTER — Inpatient Hospital Stay: Admission: RE | Admit: 2022-11-05 | Payer: Medicare HMO | Source: Ambulatory Visit

## 2022-11-12 ENCOUNTER — Telehealth (INDEPENDENT_AMBULATORY_CARE_PROVIDER_SITE_OTHER): Payer: Self-pay | Admitting: Nurse Practitioner

## 2022-11-12 DIAGNOSIS — M533 Sacrococcygeal disorders, not elsewhere classified: Secondary | ICD-10-CM | POA: Diagnosis not present

## 2022-11-12 DIAGNOSIS — M545 Low back pain, unspecified: Secondary | ICD-10-CM | POA: Diagnosis not present

## 2022-11-12 NOTE — Telephone Encounter (Signed)
Patient called and stated about 3 weeks ago, she went to the hospital and the nurse stuck her 3 time and could not get blood and then she stuck her on the right top on her hand and still could not get blood.  Now she have a big bubble and it is continuing to grow and it is turning black with a lot of itching and starting to give her pain.  Please advise.

## 2022-11-12 NOTE — Telephone Encounter (Signed)
The patient should see her primary care for evaluation of the area.

## 2022-11-13 ENCOUNTER — Telehealth (INDEPENDENT_AMBULATORY_CARE_PROVIDER_SITE_OTHER): Payer: Self-pay

## 2022-11-13 NOTE — Telephone Encounter (Signed)
Phone number no longer working.

## 2022-11-14 NOTE — Telephone Encounter (Signed)
Tried to reach out to patient again, but number is still not available

## 2022-11-21 ENCOUNTER — Ambulatory Visit
Admission: RE | Admit: 2022-11-21 | Discharge: 2022-11-21 | Disposition: A | Discharge status: Home / Self Care | Payer: Medicare HMO | Source: Ambulatory Visit | Attending: Student | Admitting: Student

## 2022-11-21 DIAGNOSIS — M5416 Radiculopathy, lumbar region: Secondary | ICD-10-CM

## 2022-11-21 DIAGNOSIS — R140 Abdominal distension (gaseous): Secondary | ICD-10-CM | POA: Diagnosis not present

## 2022-11-21 DIAGNOSIS — K581 Irritable bowel syndrome with constipation: Secondary | ICD-10-CM | POA: Diagnosis not present

## 2022-11-21 DIAGNOSIS — E119 Type 2 diabetes mellitus without complications: Secondary | ICD-10-CM | POA: Diagnosis not present

## 2022-11-21 MED ORDER — GADOPICLENOL 0.5 MMOL/ML IV SOLN
10.0000 mL | Freq: Once | INTRAVENOUS | Status: AC | PRN
  Administered 2022-11-21: 10 mL via INTRAVENOUS

## 2022-11-21 MED ORDER — GADOPICLENOL 0.5 MMOL/ML IV SOLN: 10.0000 mL | Freq: Once | INTRAVENOUS | Status: DC | PRN

## 2022-11-26 DIAGNOSIS — R490 Dysphonia: Secondary | ICD-10-CM | POA: Diagnosis not present

## 2022-11-26 DIAGNOSIS — R1310 Dysphagia, unspecified: Secondary | ICD-10-CM | POA: Diagnosis not present

## 2022-11-26 DIAGNOSIS — R070 Pain in throat: Secondary | ICD-10-CM | POA: Diagnosis not present

## 2022-12-03 DIAGNOSIS — L4050 Arthropathic psoriasis, unspecified: Secondary | ICD-10-CM | POA: Diagnosis not present

## 2022-12-11 ENCOUNTER — Ambulatory Visit: Payer: Medicare HMO | Admitting: Student in an Organized Health Care Education/Training Program

## 2022-12-17 DIAGNOSIS — M5136 Other intervertebral disc degeneration, lumbar region: Secondary | ICD-10-CM | POA: Diagnosis not present

## 2022-12-17 DIAGNOSIS — M461 Sacroiliitis, not elsewhere classified: Secondary | ICD-10-CM | POA: Diagnosis not present

## 2022-12-18 DIAGNOSIS — E119 Type 2 diabetes mellitus without complications: Secondary | ICD-10-CM | POA: Diagnosis not present

## 2022-12-19 DIAGNOSIS — M5136 Other intervertebral disc degeneration, lumbar region: Secondary | ICD-10-CM | POA: Diagnosis not present

## 2022-12-19 DIAGNOSIS — M4316 Spondylolisthesis, lumbar region: Secondary | ICD-10-CM | POA: Diagnosis not present

## 2022-12-19 DIAGNOSIS — M47816 Spondylosis without myelopathy or radiculopathy, lumbar region: Secondary | ICD-10-CM | POA: Diagnosis not present

## 2022-12-19 DIAGNOSIS — M9983 Other biomechanical lesions of lumbar region: Secondary | ICD-10-CM | POA: Diagnosis not present

## 2022-12-25 DIAGNOSIS — M461 Sacroiliitis, not elsewhere classified: Secondary | ICD-10-CM | POA: Diagnosis not present

## 2022-12-30 ENCOUNTER — Ambulatory Visit: Payer: Medicare HMO | Admitting: Student in an Organized Health Care Education/Training Program

## 2022-12-31 ENCOUNTER — Telehealth: Payer: Self-pay | Admitting: Student in an Organized Health Care Education/Training Program

## 2022-12-31 NOTE — Telephone Encounter (Signed)
Patient called today about missed appt on 12-30-22. Her authorization runs out on 01-08-23. I am unable to find her an appt before that. Can you get an extension on her authorization?

## 2023-01-01 DIAGNOSIS — E0500 Thyrotoxicosis with diffuse goiter without thyrotoxic crisis or storm: Secondary | ICD-10-CM | POA: Diagnosis not present

## 2023-01-14 DIAGNOSIS — I10 Essential (primary) hypertension: Secondary | ICD-10-CM | POA: Diagnosis not present

## 2023-01-14 DIAGNOSIS — L409 Psoriasis, unspecified: Secondary | ICD-10-CM | POA: Diagnosis not present

## 2023-01-14 DIAGNOSIS — L4050 Arthropathic psoriasis, unspecified: Secondary | ICD-10-CM | POA: Diagnosis not present

## 2023-01-14 DIAGNOSIS — E1142 Type 2 diabetes mellitus with diabetic polyneuropathy: Secondary | ICD-10-CM | POA: Diagnosis not present

## 2023-01-14 DIAGNOSIS — N950 Postmenopausal bleeding: Secondary | ICD-10-CM | POA: Diagnosis not present

## 2023-01-14 DIAGNOSIS — M159 Polyosteoarthritis, unspecified: Secondary | ICD-10-CM | POA: Diagnosis not present

## 2023-01-14 DIAGNOSIS — M797 Fibromyalgia: Secondary | ICD-10-CM | POA: Diagnosis not present

## 2023-01-14 DIAGNOSIS — Z79899 Other long term (current) drug therapy: Secondary | ICD-10-CM | POA: Diagnosis not present

## 2023-01-14 DIAGNOSIS — N898 Other specified noninflammatory disorders of vagina: Secondary | ICD-10-CM | POA: Diagnosis not present

## 2023-01-21 DIAGNOSIS — Z6838 Body mass index (BMI) 38.0-38.9, adult: Secondary | ICD-10-CM | POA: Diagnosis not present

## 2023-01-21 DIAGNOSIS — M5136 Other intervertebral disc degeneration, lumbar region: Secondary | ICD-10-CM | POA: Diagnosis not present

## 2023-01-21 DIAGNOSIS — E119 Type 2 diabetes mellitus without complications: Secondary | ICD-10-CM | POA: Diagnosis not present

## 2023-01-27 ENCOUNTER — Other Ambulatory Visit: Payer: Self-pay | Admitting: Student

## 2023-01-27 DIAGNOSIS — M4316 Spondylolisthesis, lumbar region: Secondary | ICD-10-CM

## 2023-02-04 ENCOUNTER — Ambulatory Visit
Admission: RE | Admit: 2023-02-04 | Discharge: 2023-02-04 | Disposition: A | Discharge status: Home / Self Care | Payer: Medicare HMO | Source: Ambulatory Visit | Attending: Student | Admitting: Student

## 2023-02-04 DIAGNOSIS — M4316 Spondylolisthesis, lumbar region: Secondary | ICD-10-CM

## 2023-02-04 DIAGNOSIS — M5136 Other intervertebral disc degeneration, lumbar region: Secondary | ICD-10-CM | POA: Diagnosis not present

## 2023-02-04 DIAGNOSIS — M79606 Pain in leg, unspecified: Secondary | ICD-10-CM | POA: Diagnosis not present

## 2023-02-04 DIAGNOSIS — M545 Low back pain, unspecified: Secondary | ICD-10-CM | POA: Diagnosis not present

## 2023-02-04 MED ORDER — ONDANSETRON HCL 4 MG/2ML IJ SOLN
4.0000 mg | Freq: Once | INTRAMUSCULAR | Status: AC | PRN
  Administered 2023-02-04: 4 mg via INTRAMUSCULAR

## 2023-02-04 MED ORDER — IOPAMIDOL (ISOVUE-M 200) INJECTION 41%
20.0000 mL | Freq: Once | INTRAMUSCULAR | Status: AC
  Administered 2023-02-04: 20 mL via INTRATHECAL

## 2023-02-04 MED ORDER — DIAZEPAM 5 MG PO TABS: 5.0000 mg | ORAL_TABLET | Freq: Once | ORAL | Status: DC

## 2023-02-04 MED ORDER — MEPERIDINE HCL 50 MG/ML IJ SOLN
50.0000 mg | Freq: Once | INTRAMUSCULAR | Status: AC | PRN
  Administered 2023-02-04: 50 mg via INTRAMUSCULAR

## 2023-02-04 NOTE — Discharge Instructions (Signed)

## 2023-02-04 NOTE — Discharge Instr - Other Info (Signed)
1320: Pt reported pain 10/10 post myelogram procedure, see MAR.

## 2023-02-05 ENCOUNTER — Inpatient Hospital Stay: Payer: Medicare HMO

## 2023-02-05 ENCOUNTER — Encounter: Payer: Self-pay | Admitting: Student in an Organized Health Care Education/Training Program

## 2023-02-05 ENCOUNTER — Inpatient Hospital Stay: Payer: Medicare HMO | Attending: Internal Medicine | Admitting: Internal Medicine

## 2023-02-05 ENCOUNTER — Encounter: Payer: Self-pay | Admitting: Internal Medicine

## 2023-02-05 ENCOUNTER — Ambulatory Visit
Payer: Medicare HMO | Attending: Student in an Organized Health Care Education/Training Program | Admitting: Student in an Organized Health Care Education/Training Program

## 2023-02-05 ENCOUNTER — Ambulatory Visit
Admission: RE | Admit: 2023-02-05 | Discharge: 2023-02-05 | Disposition: A | Discharge status: Home / Self Care | Payer: Medicare HMO | Source: Ambulatory Visit | Attending: Student in an Organized Health Care Education/Training Program | Admitting: Student in an Organized Health Care Education/Training Program

## 2023-02-05 VITALS — BP 142/86 | HR 74 | Temp 97.6°F | Resp 16 | Ht 71.0 in | Wt 270.0 lb

## 2023-02-05 VITALS — BP 152/87 | HR 71 | Temp 97.8°F | Ht 71.0 in | Wt 274.0 lb

## 2023-02-05 DIAGNOSIS — Z809 Family history of malignant neoplasm, unspecified: Secondary | ICD-10-CM | POA: Insufficient documentation

## 2023-02-05 DIAGNOSIS — R161 Splenomegaly, not elsewhere classified: Secondary | ICD-10-CM

## 2023-02-05 DIAGNOSIS — Z79899 Other long term (current) drug therapy: Secondary | ICD-10-CM | POA: Insufficient documentation

## 2023-02-05 DIAGNOSIS — M2550 Pain in unspecified joint: Secondary | ICD-10-CM | POA: Diagnosis not present

## 2023-02-05 DIAGNOSIS — M4802 Spinal stenosis, cervical region: Secondary | ICD-10-CM | POA: Insufficient documentation

## 2023-02-05 DIAGNOSIS — Z981 Arthrodesis status: Secondary | ICD-10-CM | POA: Insufficient documentation

## 2023-02-05 DIAGNOSIS — Z87891 Personal history of nicotine dependence: Secondary | ICD-10-CM | POA: Diagnosis not present

## 2023-02-05 DIAGNOSIS — M5412 Radiculopathy, cervical region: Secondary | ICD-10-CM | POA: Insufficient documentation

## 2023-02-05 DIAGNOSIS — D708 Other neutropenia: Secondary | ICD-10-CM | POA: Diagnosis not present

## 2023-02-05 DIAGNOSIS — L4050 Arthropathic psoriasis, unspecified: Secondary | ICD-10-CM | POA: Insufficient documentation

## 2023-02-05 LAB — CBC WITH DIFFERENTIAL/PLATELET
Abs Immature Granulocytes: 0.01 10*3/uL (ref 0.00–0.07)
Basophils Absolute: 0 10*3/uL (ref 0.0–0.1)
Basophils Relative: 0 %
Eosinophils Absolute: 0 10*3/uL (ref 0.0–0.5)
Eosinophils Relative: 1 %
HCT: 42.1 % (ref 36.0–46.0)
Hemoglobin: 13.7 g/dL (ref 12.0–15.0)
Immature Granulocytes: 0 %
Lymphocytes Relative: 20 %
Lymphs Abs: 0.5 10*3/uL — ABNORMAL LOW (ref 0.7–4.0)
MCH: 27.1 pg (ref 26.0–34.0)
MCHC: 32.5 g/dL (ref 30.0–36.0)
MCV: 83.4 fL (ref 80.0–100.0)
Monocytes Absolute: 0.1 10*3/uL (ref 0.1–1.0)
Monocytes Relative: 4 %
Neutro Abs: 2 10*3/uL (ref 1.7–7.7)
Neutrophils Relative %: 75 %
Platelets: 211 10*3/uL (ref 150–400)
RBC: 5.05 MIL/uL (ref 3.87–5.11)
RDW: 14.6 % (ref 11.5–15.5)
WBC: 2.7 10*3/uL — ABNORMAL LOW (ref 4.0–10.5)
nRBC: 0 % (ref 0.0–0.2)

## 2023-02-05 LAB — FOLATE: Folate: 26 ng/mL (ref >5.9)

## 2023-02-05 LAB — TECHNOLOGIST SMEAR REVIEW: Plt Morphology: NORMAL

## 2023-02-05 LAB — COMPREHENSIVE METABOLIC PANEL
ALT: 20 U/L (ref 0–44)
AST: 23 U/L (ref 15–41)
Albumin: 5.1 g/dL — ABNORMAL HIGH (ref 3.5–5.0)
Alkaline Phosphatase: 89 U/L (ref 38–126)
Anion gap: 9 (ref 5–15)
BUN: 7 mg/dL — ABNORMAL LOW (ref 8–23)
CO2: 26 mmol/L (ref 22–32)
Calcium: 10.2 mg/dL (ref 8.9–10.3)
Chloride: 100 mmol/L (ref 98–111)
Creatinine, Ser: 0.67 mg/dL (ref 0.44–1.00)
GFR, Estimated: >60 mL/min (ref >60)
Glucose, Bld: 117 mg/dL — ABNORMAL HIGH (ref 70–99)
Potassium: 4 mmol/L (ref 3.5–5.1)
Sodium: 135 mmol/L (ref 135–145)
Total Bilirubin: 0.5 mg/dL (ref 0.3–1.2)
Total Protein: 8.3 g/dL — ABNORMAL HIGH (ref 6.5–8.1)

## 2023-02-05 LAB — VITAMIN B12: Vitamin B-12: 534 pg/mL (ref 180–914)

## 2023-02-05 LAB — IRON AND TIBC
Iron: 77 ug/dL (ref 28–170)
Saturation Ratios: 17 % (ref 10.4–31.8)
TIBC: 449 ug/dL (ref 250–450)
UIBC: 372 ug/dL

## 2023-02-05 LAB — HEPATITIS B SURFACE ANTIGEN: Hepatitis B Surface Ag: NONREACTIVE

## 2023-02-05 LAB — FERRITIN: Ferritin: 48 ng/mL (ref 11–307)

## 2023-02-05 LAB — RETICULOCYTES
Immature Retic Fract: 7.1 % (ref 2.3–15.9)
RBC.: 5.03 MIL/uL (ref 3.87–5.11)
Retic Count, Absolute: 60.9 10*3/uL (ref 19.0–186.0)
Retic Ct Pct: 1.2 % (ref 0.4–3.1)

## 2023-02-05 LAB — C-REACTIVE PROTEIN: CRP: <0.5 mg/dL (ref <1.0)

## 2023-02-05 LAB — HEPATITIS C ANTIBODY: HCV Ab: NONREACTIVE

## 2023-02-05 LAB — HEPATITIS B CORE ANTIBODY, IGM: Hep B C IgM: NONREACTIVE

## 2023-02-05 LAB — HIV ANTIBODY (ROUTINE TESTING W REFLEX): HIV Screen 4th Generation wRfx: NONREACTIVE

## 2023-02-05 LAB — LACTATE DEHYDROGENASE: LDH: 174 U/L (ref 98–192)

## 2023-02-05 MED ORDER — SODIUM CHLORIDE (PF) 0.9 % IJ SOLN
INTRAMUSCULAR | Status: AC
  Filled 2023-02-05: qty 10

## 2023-02-05 MED ORDER — DEXAMETHASONE SODIUM PHOSPHATE 10 MG/ML IJ SOLN
10.0000 mg | Freq: Once | INTRAMUSCULAR | Status: AC
  Administered 2023-02-05: 10 mg

## 2023-02-05 MED ORDER — LIDOCAINE HCL 2 % IJ SOLN
INTRAMUSCULAR | Status: AC
  Filled 2023-02-05: qty 20

## 2023-02-05 MED ORDER — IOHEXOL 180 MG/ML  SOLN
INTRAMUSCULAR | Status: AC
  Filled 2023-02-05: qty 20

## 2023-02-05 MED ORDER — ROPIVACAINE HCL 2 MG/ML IJ SOLN
INTRAMUSCULAR | Status: AC
  Filled 2023-02-05: qty 20

## 2023-02-05 MED ORDER — SODIUM CHLORIDE 0.9% FLUSH
1.0000 mL | Freq: Once | INTRAVENOUS | Status: AC
  Administered 2023-02-05: 1 mL

## 2023-02-05 MED ORDER — LIDOCAINE HCL 2 % IJ SOLN
20.0000 mL | Freq: Once | INTRAMUSCULAR | Status: AC
  Administered 2023-02-05: 400 mg

## 2023-02-05 MED ORDER — IOHEXOL 180 MG/ML  SOLN
10.0000 mL | Freq: Once | INTRAMUSCULAR | Status: AC
  Administered 2023-02-05: 10 mL via EPIDURAL

## 2023-02-05 MED ORDER — ROPIVACAINE HCL 2 MG/ML IJ SOLN
1.0000 mL | Freq: Once | INTRAMUSCULAR | Status: AC
  Administered 2023-02-05: 1 mL via EPIDURAL

## 2023-02-05 MED ORDER — DEXAMETHASONE SODIUM PHOSPHATE 10 MG/ML IJ SOLN
INTRAMUSCULAR | Status: AC
  Filled 2023-02-05: qty 1

## 2023-02-05 NOTE — Patient Instructions (Signed)

## 2023-02-05 NOTE — Progress Notes (Signed)
PROVIDER NOTE: Interpretation of information contained herein should be left to medically-trained personnel. Specific patient instructions are provided elsewhere under "Patient Instructions" section of medical record. This document was created in part using STT-dictation technology, any transcriptional errors that may result from this process are unintentional.  Patient: Christine Higgins Type: Established DOB: February 17, 1956 MRN: [id number] PCP: Lauro Regulus, MD  Service: Procedure DOS: 02/05/2023 Setting: Ambulatory Location: Ambulatory outpatient facility Delivery: Face-to-face Provider: Edward Jolly, MD Specialty: Interventional Pain Management Specialty designation: 09 Location: Outpatient facility Ref. Prov.: Edward Jolly, MD       Interventional Therapy   Procedure: Cervical Epidural Steroid injection (CESI) (Interlaminar) #1  Laterality: Left  Level: C7-T1 Imaging: Fluoroscopy-assisted DOS: 02/05/2023  Performed by: Edward Jolly, MD Anesthesia: Local anesthesia (1-2% Lidocaine)   Purpose: Diagnostic/Therapeutic Indications: Cervicalgia, cervical radicular pain, degenerative disc disease, severe enough to impact quality of life or function. 1. Cervical radicular pain (R>L)   2. S/P cervical spinal fusion   3. Foraminal stenosis of cervical region    NAS-11 score:   Pre-procedure: 8 /10   Post-procedure: 4 /10      Position  Prep  Materials:  Location setting: Procedure suite Position: Prone, on modified reverse trendelenburg to facilitate breathing, with head in head-cradle. Pillows positioned under chest (below chin-level) with cervical spine flexed. Safety Precautions: Patient was assessed for positional comfort and pressure points before starting the procedure. Prepping solution: DuraPrep (Iodine Povacrylex [0.7% available iodine] and Isopropyl Alcohol, 74% w/w) Prep Area: Entire  cervicothoracic region Approach: percutaneous, paramedial Intended target:  Posterior cervical epidural space Materials Procedure:  Tray: Epidural Needle(s): Epidural (Tuohy) Qty: 1 Length: (90mm) 3.5-inch Gauge: 22G   Pre-op H&P Assessment:  Christine Higgins is a 67 y.o. (year old), female patient, seen today for interventional treatment. She  has a past surgical history that includes carpel tunn (Right); Hand surgery (Right); carpel tunnel (Left); Cesarean section; Shoulder surgery (Right); Back surgery; Neck surgery; Total hip arthroplasty (Right); Excision Morton's neuroma (Left, 02/05/2017); Colonoscopy; Joint replacement; Esophagogastroduodenoscopy (egd) with propofol (N/A, 06/26/2017); Knee Arthroplasty (Left, 08/11/2017); Knee arthroscopy (Right, 07/02/2018); Reverse shoulder arthroplasty (Left, 06/24/2019); Thoracic laminectomy for spinal cord stimulator (N/A, 08/07/2020); Colonoscopy with propofol (N/A, 10/20/2020); Cardiac catheterization (2006); Tubal ligation; Knee Arthroplasty (Right, 02/05/2021); Spinal cord stimulator removal (N/A, 04/09/2021); Cataract extraction (Bilateral, 2022); Fracture surgery (1969); Lumbar laminectomy/decompression microdiscectomy (N/A, 06/19/2022); and Dilatation & curettage/hysteroscopy with myosure (N/A, 10/24/2022). Christine Higgins has a current medication list which includes the following prescription(s): acetaminophen, albuterol, atorvastatin, budesonide-formoterol, celecoxib, clonazepam, duloxetine, duloxetine, esomeprazole, fluticasone, gabapentin, ibandronate, levocetirizine, linaclotide, losartan, methimazole, methocarbamol, multivitamin with minerals, potassium chloride, propranolol, restasis, sucralfate, torsemide, trazodone, alum & mag hydroxide-simeth, calcium carbonate, vitamin d3 super strength, coal tar extract, b-12, diphenhydramine, benadryl itch stopping, docusate sodium, doxycycline, eucerin, gold bond healing, guaifenesin, hydrocodone-acetaminophen, hydrocortisone cream, hydroxyzine, loperamide, magnesium hydroxide, menthol-zinc  oxide, miconazole, naloxone hcl, OVER THE COUNTER MEDICATION, oxycodone-acetaminophen, prednisone, pyrithione zinc, and vitamin e. Her primarily concern today is the Generalized Body Aches  Initial Vital Signs:  Pulse/HCG Rate: 79  Temp: 97.6 F (36.4 C) Resp: 16 BP: (!) 141/78 SpO2: 96 %  BMI: Estimated body mass index is 37.66 kg/m as calculated from the following:   Height as of this encounter: 5\' 11"  (1.803 m).   Weight as of this encounter: 270 lb (122.5 kg).  Risk Assessment: Allergies: Reviewed. She is allergic to cephalexin, ibuprofen, potassium chloride, shellfish allergy, and aspirin.  Allergy Precautions: None required Coagulopathies: Reviewed. None identified.  Blood-thinner therapy: None at  this time Active Infection(s): Reviewed. None identified. Christine Higgins is afebrile  Site Confirmation: Christine Higgins was asked to confirm the procedure and laterality before marking the site Procedure checklist: Completed Consent: Before the procedure and under the influence of no sedative(s), amnesic(s), or anxiolytics, the patient was informed of the treatment options, risks and possible complications. To fulfill our ethical and legal obligations, as recommended by the American Medical Association's Code of Ethics, I have informed the patient of my clinical impression; the nature and purpose of the treatment or procedure; the risks, benefits, and possible complications of the intervention; the alternatives, including doing nothing; the risk(s) and benefit(s) of the alternative treatment(s) or procedure(s); and the risk(s) and benefit(s) of doing nothing. The patient was provided information about the general risks and possible complications associated with the procedure. These may include, but are not limited to: failure to achieve desired goals, infection, bleeding, organ or nerve damage, allergic reactions, paralysis, and death. In addition, the patient was informed of those risks and  complications associated to Spine-related procedures, such as failure to decrease pain; infection (i.e.: Meningitis, epidural or intraspinal abscess); bleeding (i.e.: epidural hematoma, subarachnoid hemorrhage, or any other type of intraspinal or peri-dural bleeding); organ or nerve damage (i.e.: Any type of peripheral nerve, nerve root, or spinal cord injury) with subsequent damage to sensory, motor, and/or autonomic systems, resulting in permanent pain, numbness, and/or weakness of one or several areas of the body; allergic reactions; (i.e.: anaphylactic reaction); and/or death. Furthermore, the patient was informed of those risks and complications associated with the medications. These include, but are not limited to: allergic reactions (i.e.: anaphylactic or anaphylactoid reaction(s)); adrenal axis suppression; blood sugar elevation that in diabetics may result in ketoacidosis or comma; water retention that in patients with history of congestive heart failure may result in shortness of breath, pulmonary edema, and decompensation with resultant heart failure; weight gain; swelling or edema; medication-induced neural toxicity; particulate matter embolism and blood vessel occlusion with resultant organ, and/or nervous system infarction; and/or aseptic necrosis of one or more joints. Finally, the patient was informed that Medicine is not an exact science; therefore, there is also the possibility of unforeseen or unpredictable risks and/or possible complications that may result in a catastrophic outcome. The patient indicated having understood very clearly. We have given the patient no guarantees and we have made no promises. Enough time was given to the patient to ask questions, all of which were answered to the patient's satisfaction. Ms. Tignor has indicated that she wanted to continue with the procedure. Attestation: I, the ordering provider, attest that I have discussed with the patient the benefits, risks,  side-effects, alternatives, likelihood of achieving goals, and potential problems during recovery for the procedure that I have provided informed consent. Date  Time: 02/05/2023 10:51 AM   Pre-Procedure Preparation:  Monitoring: As per clinic protocol. Respiration, ETCO2, SpO2, BP, heart rate and rhythm monitor placed and checked for adequate function Safety Precautions: Patient was assessed for positional comfort and pressure points before starting the procedure. Time-out: I initiated and conducted the "Time-out" before starting the procedure, as per protocol. The patient was asked to participate by confirming the accuracy of the "Time Out" information. Verification of the correct person, site, and procedure were performed and confirmed by me, the nursing staff, and the patient. "Time-out" conducted as per Joint Commission's Universal Protocol (UP.01.01.01). Time: 1134 Start Time: 1134 hrs.  Description  Narrative of Procedure:          Rationale (  medical necessity): procedure needed and proper for the diagnosis and/or treatment of the patient's medical symptoms and needs. Start Time: 1134 hrs. Safety Precautions: Aspiration looking for blood return was conducted prior to all injections. At no point did we inject any substances, as a needle was being advanced. No attempts were made at seeking any paresthesias. Safe injection practices and needle disposal techniques used. Medications properly checked for expiration dates. SDV (single dose vial) medications used. Description of procedure: Protocol guidelines were followed. The patient was assisted into a comfortable position. The target area was identified and the area prepped in the usual manner. Skin & deeper tissues infiltrated with local anesthetic. Appropriate amount of time allowed to pass for local anesthetics to take effect. Using fluoroscopic guidance, the epidural needle was introduced through the skin, ipsilateral to the reported pain, and  advanced to the target area. Posterior laminar os was contacted and the needle walked caudad, until the lamina was cleared. The ligamentum flavum was engaged and the epidural space identified using "loss-of-resistance technique" with 2-3 ml of PF-NaCl (0.9% NSS), in a 5cc dedicated LOR syringe. (See "Imaging guidance" below for use of contrast details.) Once proper needle placement was secured, and negative aspiration confirmed, the solution was injected in intermittent fashion, asking for systemic symptoms every 0.5cc. The needles were then removed and the area cleansed, making sure to leave some of the prepping solution back to take advantage of its long term bactericidal properties.  Vitals:   02/05/23 1132 02/05/23 1137 02/05/23 1143 02/05/23 1147  BP: (!) 147/77 130/88 (!) 146/92 (!) 142/86  Pulse: 76 74 72 74  Resp: 16 16 15 16   Temp:      SpO2: 99% 98% 99% 98%  Weight:      Height:         End Time: 1138 hrs.  Imaging Guidance (Spinal):          Type of Imaging Technique: Fluoroscopy Guidance (Spinal) Indication(s): Assistance in needle guidance and placement for procedures requiring needle placement in or near specific anatomical locations not easily accessible without such assistance. Exposure Time: Please see nurses notes. Contrast: Before injecting any contrast, we confirmed that the patient did not have an allergy to iodine, shellfish, or radiological contrast. Once satisfactory needle placement was completed at the desired level, radiological contrast was injected. Contrast injected under live fluoroscopy. No contrast complications. See chart for type and volume of contrast used. Fluoroscopic Guidance: I was personally present during the use of fluoroscopy. "Tunnel Vision Technique" used to obtain the best possible view of the target area. Parallax error corrected before commencing the procedure. "Direction-depth-direction" technique used to introduce the needle under continuous  pulsed fluoroscopy. Once target was reached, antero-posterior, oblique, and lateral fluoroscopic projection used confirm needle placement in all planes. Images permanently stored in EMR. Interpretation: I personally interpreted the imaging intraoperatively. Adequate needle placement confirmed in multiple planes. Appropriate spread of contrast into desired area was observed. No evidence of afferent or efferent intravascular uptake. No intrathecal or subarachnoid spread observed. Permanent images saved into the patient's record.  Post-operative Assessment:  Post-procedure Vital Signs:  Pulse/HCG Rate: 74  Temp: 97.6 F (36.4 C) Resp: 16 BP: (!) 142/86 SpO2: 98 %  EBL: None  Complications: No immediate post-treatment complications observed by team, or reported by patient.  Note: The patient tolerated the entire procedure well. A repeat set of vitals were taken after the procedure and the patient was kept under observation following institutional policy, for this type  of procedure. Post-procedural neurological assessment was performed, showing return to baseline, prior to discharge. The patient was provided with post-procedure discharge instructions, including a section on how to identify potential problems. Should any problems arise concerning this procedure, the patient was given instructions to immediately contact us, at any time, without hesitation. In any case, we plan to contact the patient by telephone for a follow-up status report regarding this interventional procedure.  Comments:  No additional relevant information.  Plan of Care (POC)  Orders:  Orders Placed This Encounter  Procedures   DG PAIN CLIN[facility] C-ARM 1-60 MIN NO REPORT    Intraoperative interpretation by procedural physician at [facility].    Standing Status:   Standing    Number of Occurrences:   1    Order Specific Question:   Reason for exam:    Answer:   Assistance in needle guidance and placement for  procedures requiring needle placement in or near specific anatomical locations not easily accessible without such assistance.     Medications ordered for procedure: Meds ordered this encounter  Medications   iohexol (OMNIPAQUE) 180 MG/ML injection 10 mL    Must be Myelogram-compatible. If not available, you may substitute with a water-soluble, non-ionic, hypoallergenic, myelogram-compatible radiological contrast medium.   lidocaine (XYLOCAINE) 2 % (with pres) injection 400 mg   sodium chloride flush (NS) 0.9 % injection 1 mL   ropivacaine (PF) 2 mg/mL (0.2%) (NAROPIN) injection 1 mL   dexamethasone (DECADRON) injection 10 mg   Medications administered: We administered iohexol, lidocaine, sodium chloride flush, ropivacaine (PF) 2 mg/mL (0.2%), and dexamethasone.  See the medical record for exact dosing, route, and time of administration.  Follow-up plan:   Return in about 5 weeks (around 03/12/2023) for Post Procedure Evaluation, virtual.       Recent Visits No visits were found meeting these conditions. Showing recent visits within past 90 days and meeting all other requirements Today's Visits Date Type Provider Dept  02/05/23 Procedure visit Edward Jolly, MD [facility]  Showing today's visits and meeting all other requirements Future Appointments Date Type Provider Dept  03/18/23 Appointment Edward Jolly, MD [facility]  Showing future appointments within next 90 days and meeting all other requirements  Disposition: Discharge home  Discharge (Date  Time): 02/05/2023; 1201 hrs.   Primary Care Physician: Lauro Regulus, MD Location: [facility] Note by: Edward Jolly, MD (TTS technology used. I apologize for any typographical errors that were not detected and corrected.) Date: 02/05/2023; Time: 1:21 PM  Disclaimer:  Medicine is not an Visual merchandiser. The only guarantee in medicine is that nothing is guaranteed. It is  important to note that the decision to proceed with this intervention was based on the information collected from the patient. The Data and conclusions were drawn from the patient's questionnaire, the interview, and the physical examination. Because the information was provided in large part by the patient, it cannot be guaranteed that it has not been purposely or unconsciously manipulated. Every effort has been made to obtain as much relevant data as possible for this evaluation. It is important to note that the conclusions that lead to this procedure are derived in large part from the available data. Always take into account that the treatment will also be dependent on availability of resources and existing treatment guidelines, considered by other Pain Management Practitioners as being common knowledge and practice, at the time of the intervention. For Medico-Legal purposes, it is also  important to point out that variation in procedural techniques and pharmacological choices are the acceptable norm. The indications, contraindications, technique, and results of the above procedure should only be interpreted and judged by a Board-Certified Interventional Pain Specialist with extensive familiarity and expertise in the same exact procedure and technique.

## 2023-02-05 NOTE — Progress Notes (Signed)
Can the cimzia cause her dx?

## 2023-02-05 NOTE — Assessment & Plan Note (Addendum)
#  Leukopenia- neutropenia/mild intermittent anemia [MAY 2024- [facility]- 0.94] normal /platelets. Patient is asymptomatic-no increased risk of infections. Suspect benign causes rather than any malignant causes.   # Discussed possibility autoimmune etiology; less likely  medication induced; [cimzia] intrinsic bone marrow disorders; benign ethnic neutropenia [most likely]. No clinical evidence of hepatosplenomegaly or liver disease.However,    # Recommend checking CBC CMP LDH;B12; folic acid; HIV, Hep C; Hep B;  We will review peripheral smear. I discussed the possibility/need for a bone marrow biopsy if significant neutropenia is continued. However we will plan to hold bone marrow biopsy for now.   # Psoriatic arthrtitis/ fibromyalgia- [on Cimzia q 51m]-   Thank you, Dr.Patel for allowing me to participate in the care of your pleasant patient. Please do not hesitate to contact me with questions or concerns in the interim.  # DISPOSITION: # labs- ordered- CBC CMP LDH;B12; folic acid; HIV, Hep C; Hep B; US abdomen.  # follow up in 1-2 weeks- MD; no labs- -Dr.B

## 2023-02-05 NOTE — Progress Notes (Signed)
[facility] CONSULT NOTE  Patient Care Team: Lauro Regulus, MD as PCP - General (Internal Medicine)  # CHIEF COMPLAINTS/PURPOSE OF CONSULTATION: Leucopenia  # LEUCOPENIA/NEUTROPENIA- ANC; Hb; platelets; CT Ab/US; Hepatitis/HIV; Alcohol-none.   #  Oncology History   No history exists.     HISTORY OF PRESENTING ILLNESS:   Christine Higgins 67 y.o.  female with history of psoriatic arthritis fibromyalgia chronic neuropathy chronic pain is referred to [place] for further evaluation leucopenia.   Patient noted to have low white count incidentally on blood work ordered for her rheumatic disorder.  Patient is on cimizia injections on a monthly basis for the last 1 year.  Frequent infections [pneumonias/sinus infection/UTI]- none  Early satiety: None Lymph node enlargement: None  Weight loss: none  Skin rash: none Arthritis: mild Autoimmune: Psoriasis arthritis.    Medications: NSAIDs/antiepileptics-none. Cimzia - on 12 months.   Review of Systems  Constitutional:  Positive for malaise/fatigue. Negative for chills, diaphoresis, fever and weight loss.  HENT:  Negative for nosebleeds and sore throat.   Eyes:  Negative for double vision.  Respiratory:  Negative for cough, hemoptysis, sputum production, shortness of breath and wheezing.   Cardiovascular:  Negative for chest pain, palpitations, orthopnea and leg swelling.  Gastrointestinal:  Negative for abdominal pain, blood in stool, constipation, diarrhea, heartburn, melena, nausea and vomiting.  Genitourinary:  Negative for dysuria, frequency and urgency.  Musculoskeletal:  Positive for back pain and joint pain.  Skin: Negative.  Negative for itching and rash.  Neurological:  Negative for dizziness, tingling, focal weakness, weakness and headaches.  Endo/Heme/Allergies:  Does not bruise/bleed easily.  Psychiatric/Behavioral:  Negative for depression. The patient is not nervous/anxious and does not have insomnia.       MEDICAL HISTORY:  Past Medical History:  Diagnosis Date   Anxiety    Asthma    Breast mass    LEFT x 3 months per pt and palpated by physician   Cervical disc disease    Chronic pain syndrome    COPD (chronic obstructive pulmonary disease) (HCC)    Degenerative disc disease, lumbar    osteoarthritis   Depression    Diabetes mellitus without complication (HCC)    Fibromyalgia    Fibromyalgia    GERD (gastroesophageal reflux disease)    Glaucoma    Graves disease    Headache    migraines   Hemorrhoids    History of hiatal hernia    Hyperlipidemia    Hypertension    Hyperthyroidism    Lumbar disc disease    Obesity    Pre-diabetes    Psoriatic arthritis (HCC)    Thyroid disease     SURGICAL HISTORY: Past Surgical History:  Procedure Laterality Date   BACK SURGERY     sumbar   CARDIAC CATHETERIZATION  2006   carpel tunn Right    carpel tunnel Left    CATARACT EXTRACTION Bilateral 2022   CESAREAN SECTION     COLONOSCOPY     COLONOSCOPY WITH PROPOFOL N/A 10/20/2020   Procedure: COLONOSCOPY WITH PROPOFOL;  Surgeon: Regis Bill, MD;  Location: [facility];  Service: Endoscopy;  Laterality: N/A;   DILATATION & CURETTAGE/HYSTEROSCOPY WITH MYOSURE N/A 10/24/2022   Procedure: DILATATION & CURETTAGE/HYSTEROSCOPY;  Surgeon: Schermerhorn, Ihor Austin, MD;  Location: [facility];  Service: Gynecology;  Laterality: N/A;   ESOPHAGOGASTRODUODENOSCOPY (EGD) WITH PROPOFOL N/A 06/26/2017   Procedure: ESOPHAGOGASTRODUODENOSCOPY (EGD) WITH PROPOFOL;  Surgeon: Christena Deem, MD;  Location: [facility];  Service: Endoscopy;  Laterality: N/A;   EXCISION MORTON'S NEUROMA Left 02/05/2017   Procedure: EXCISION MORTON'S NEUROMA;  Surgeon: Gwyneth Revels, DPM;  Location: [facility];  Service: Podiatry;  Laterality: Left;  iva with local   FRACTURE SURGERY  1969   Left Elbow   HAND SURGERY Right    scar tissue removal   JOINT REPLACEMENT     right hip arthroplasty  08/25/15   KNEE ARTHROPLASTY Left 08/11/2017   Procedure: COMPUTER ASSISTED TOTAL KNEE ARTHROPLASTY;  Surgeon: Donato Heinz, MD;  Location: [facility];  Service: Orthopedics;  Laterality: Left;   KNEE ARTHROPLASTY Right 02/05/2021   Procedure: COMPUTER ASSISTED TOTAL KNEE ARTHROPLASTY;  Surgeon: Donato Heinz, MD;  Location: [facility];  Service: Orthopedics;  Laterality: Right;   KNEE ARTHROSCOPY Right 07/02/2018   Procedure: ARTHROSCOPY KNEE, Medial and Lateral  Chondroplasty;  Surgeon: Donato Heinz, MD;  Location: [facility];  Service: Orthopedics;  Laterality: Right;   LUMBAR LAMINECTOMY/DECOMPRESSION MICRODISCECTOMY N/A 06/19/2022   Procedure: EXPLORE LUMBAR WOUND FOR EVACUATION OF HEMATOMA;  Surgeon: Tressie Stalker, MD;  [facility];  Service: Neurosurgery;  Laterality: N/A;   NECK SURGERY     "disk implant"   REVERSE SHOULDER ARTHROPLASTY Left 06/24/2019   Procedure: REVERSE SHOULDER ARTHROPLASTY;  Surgeon: Christena Flake, MD;  Location: [facility];  Service: Orthopedics;  Laterality: Left;   SHOULDER SURGERY Right    spur removal   SPINAL CORD STIMULATOR REMOVAL N/A 04/09/2021   Procedure: REMOVAL SPINAL CORD STIMULATOR & PULSE GENERATOR;  Surgeon: Lucy Chris, MD;  Location: [facility];  Service: Neurosurgery;  Laterality: N/A;  2nd case   THORACIC LAMINECTOMY FOR SPINAL CORD STIMULATOR N/A 08/07/2020   Procedure: THORACIC SPINAL CORD STIMULATOR VIA LAMINECTOMY, PULSE GENERATOR;  Surgeon: Lucy Chris, MD;  Location: [facility];  Service: Neurosurgery;  Laterality: N/A;   TOTAL HIP ARTHROPLASTY Right    TUBAL LIGATION      SOCIAL HISTORY: Social History   Socioeconomic History   Marital status: Single    Spouse name: Not on file   Number of children: Not on file   Years of education: Not on file   Highest education level: Not on file  Occupational History   Not on file  Tobacco Use   Smoking status: Former    Types: Cigarettes    Quit date: 27    Years since  quitting: 30.4   Smokeless tobacco: Never  Vaping Use   Vaping Use: Never used  Substance and Sexual Activity   Alcohol use: No    Alcohol/week: 0.0 standard drinks of alcohol   Drug use: Not Currently    Types: "Crack" cocaine, Marijuana    Comment: 1991   Sexual activity: Not Currently    Birth control/protection: None  Other Topics Concern   Not on file  Social History Narrative   Live in assisted living   Social Determinants of Health   Financial Resource Strain: Not on file  Food Insecurity: Food Insecurity Present (02/05/2023)   Hunger Vital Sign    Worried About Running Out of Food in the Last Year: Sometimes true    Ran Out of Food in the Last Year: Sometimes true  Transportation Needs: No Transportation Needs (02/05/2023)   PRAPARE - Administrator, Civil Service (Medical): No    Lack of Transportation (Non-Medical): No  Physical Activity: Not on file  Stress: Not on file  Social Connections: Not on file  Intimate Partner Violence: Not At  Risk (02/05/2023)   Humiliation, Afraid, Rape, and Kick questionnaire    Fear of Current or Ex-Partner: No    Emotionally Abused: No    Physically Abused: No    Sexually Abused: No    FAMILY HISTORY: Family History  Problem Relation Age of Onset   Cancer Mother    Heart disease Father    Breast cancer Neg Hx     ALLERGIES:  is allergic to cephalexin, ibuprofen, potassium chloride, shellfish allergy, and aspirin.  MEDICATIONS:  Current Outpatient Medications  Medication Sig Dispense Refill   acetaminophen (TYLENOL) 325 MG tablet Take 650 mg by mouth every 4 (four) hours as needed for mild pain, fever or headache.     albuterol (VENTOLIN HFA) 108 (90 Base) MCG/ACT inhaler Inhale 1 puff into the lungs every 4 (four) hours as needed for shortness of breath or wheezing.     alum & mag hydroxide-simeth (MAALOX/MYLANTA) 200-200-20 MG/5ML suspension Take 30 mLs by mouth every 8 (eight) hours as needed for indigestion or  heartburn.     atorvastatin (LIPITOR) 10 MG tablet Take 10 mg by mouth daily after supper.      budesonide-formoterol (SYMBICORT) 160-4.5 MCG/ACT inhaler Inhale 2 puffs into the lungs 2 (two) times daily.     celecoxib (CELEBREX) 200 MG capsule Take 200 mg by mouth 2 (two) times daily.     clonazePAM (KLONOPIN) 0.5 MG tablet Take 0.5 mg by mouth at bedtime as needed for anxiety.     DULoxetine (CYMBALTA) 30 MG capsule Take 30 mg by mouth at bedtime.     DULoxetine (CYMBALTA) 60 MG capsule Take 60 mg by mouth daily.     esomeprazole (NEXIUM) 40 MG capsule Take 40 mg by mouth 2 (two) times daily.      fluticasone (FLONASE) 50 MCG/ACT nasal spray Place 2 sprays into both nostrils daily.     gabapentin (NEURONTIN) 600 MG tablet Take 1,200 mg by mouth in the morning, at noon, and at bedtime.     HYDROcodone-acetaminophen (NORCO/VICODIN) 5-325 MG tablet Take 1 tablet by mouth every 6 (six) hours as needed for moderate pain.     ibandronate (BONIVA) 150 MG tablet Take 150 mg by mouth every 30 (thirty) days.     levocetirizine (XYZAL) 5 MG tablet Take 5 mg by mouth every evening.     linaclotide (LINZESS) 290 MCG CAPS capsule Take 290 mcg by mouth daily.     loperamide (IMODIUM A-D) 2 MG tablet Take 2 mg by mouth 4 (four) times daily as needed for diarrhea or loose stools.     losartan (COZAAR) 100 MG tablet Take 100 mg by mouth daily.     methimazole (TAPAZOLE) 5 MG tablet Take 5 mg by mouth daily.     methocarbamol 1000 MG TABS Take 1,000 mg by mouth 3 (three) times daily. (Patient taking differently: Take 1,000 mg by mouth every 12 (twelve) hours as needed.)     Multiple Vitamin (MULTIVITAMIN WITH MINERALS) TABS tablet Take 1 tablet by mouth daily.     potassium chloride (KLOR-CON) 10 MEQ tablet Take 10 mEq by mouth daily.     propranolol (INDERAL) 80 MG tablet Take 80 mg by mouth 2 (two) times daily.     RESTASIS 0.05 % ophthalmic emulsion Place 1 drop into both eyes 2 (two) times daily.      sucralfate (CARAFATE) 1 g tablet Take 1 g by mouth 4 (four) times daily -  before meals and at bedtime.  torsemide (DEMADEX) 10 MG tablet Take 10 mg by mouth daily.     traZODone (DESYREL) 150 MG tablet Take 75 mg by mouth at bedtime as needed for sleep.      calcium carbonate (OS-CAL) 600 MG TABS tablet Take 600 mg by mouth every other day. (Patient not taking: Reported on 10/24/2022)     Cholecalciferol (VITAMIN D3 SUPER STRENGTH) 50 MCG (2000 UT) TABS Take 3,000 Units by mouth daily. (Patient not taking: Reported on 02/05/2023)     diphenhydrAMINE (BENADRYL) 25 mg capsule Take 25 mg by mouth 4 (four) times daily as needed. (Patient not taking: Reported on 02/05/2023)     Naloxone HCl (NARCAN NA) Place 4 mg into the nose 3 (three) times daily as needed. (Patient not taking: Reported on 10/24/2022)     No current facility-administered medications for this visit.      PHYSICAL EXAMINATION:  Vitals:   02/05/23 1338  BP: (!) 152/87  Pulse: 71  Temp: 97.8 F (36.6 C)  SpO2: 99%   Filed Weights   02/05/23 1338  Weight: 274 lb (124.3 kg)    Physical Exam Vitals and nursing note reviewed.  HENT:     Head: Normocephalic and atraumatic.     Mouth/Throat:     Pharynx: Oropharynx is clear.  Eyes:     Extraocular Movements: Extraocular movements intact.     Pupils: Pupils are equal, round, and reactive to light.  Cardiovascular:     Rate and Rhythm: Normal rate and regular rhythm.  Pulmonary:     Comments: Decreased breath sounds bilaterally.  Abdominal:     Palpations: Abdomen is soft.  Musculoskeletal:        General: Normal range of motion.     Cervical back: Normal range of motion.  Skin:    General: Skin is warm.  Neurological:     General: No focal deficit present.     Mental Status: She is alert and oriented to person, place, and time.  Psychiatric:        Behavior: Behavior normal.        Judgment: Judgment normal.      LABORATORY DATA:  I have reviewed the data  as listed Lab Results  Component Value Date   WBC 2.7 (L) 02/05/2023   HGB 13.7 02/05/2023   HCT 42.1 02/05/2023   MCV 83.4 02/05/2023   PLT 211 02/05/2023   Recent Labs    09/19/22 1728 10/22/22 1310 02/05/23 1447  NA 133* 131* 135  K 3.8 3.3* 4.0  CL 100 95* 100  CO2 24 24 26   GLUCOSE 140* 155* 117*  BUN 9 10 7*  CREATININE 0.71 0.78 0.67  CALCIUM 9.6 9.1 10.2  GFRNONAA >60 >60 >60  PROT 7.8  --  8.3*  ALBUMIN 4.7  --  5.1*  AST 21  --  23  ALT 20  --  20  ALKPHOS 97  --  89  BILITOT 0.7  --  0.5    RADIOGRAPHIC STUDIES: I have personally reviewed the radiological images as listed and agreed with the findings in the report. DG PAIN CLINIC C-ARM 1-60 MIN NO REPORT  Result Date: 02/05/2023 Fluoro was used, but no Radiologist interpretation will be provided. Please refer to "NOTES" tab for provider progress note.  CT LUMBAR SPINE W CONTRAST  Result Date: 02/04/2023 CLINICAL DATA:  Lumbar adjacent segment disease with spondylolisthesis. Prior lumbar fusion. Low back and bilateral leg pain after a fall. EXAM: LUMBAR MYELOGRAM FLUOROSCOPY: Radiation  Exposure Index (as provided by the fluoroscopic device): 21.0 mGy Kerma PROCEDURE: After thorough discussion of risks and benefits of the procedure including bleeding, infection, injury to nerves, blood vessels, adjacent structures as well as headache and CSF leak, written and oral informed consent was obtained. Consent was obtained by Dr. Sebastian Ache. Time out form was completed. Patient was positioned prone on the fluoroscopy table. Local anesthesia was provided with 1% lidocaine without epinephrine after prepped and draped in the usual sterile fashion. Puncture was performed at L4-5 using a 5 inch 22-gauge spinal needle via a midline approach. Using a single pass through the dura, the needle was placed within the thecal sac, with return of clear CSF. 15 mL of Isovue M-200 was injected into the thecal sac, with normal opacification of  the nerve roots and cauda equina consistent with free flow within the subarachnoid space. I personally performed the lumbar puncture and administered the intrathecal contrast. I also personally supervised acquisition of the myelogram images. TECHNIQUE: Contiguous axial images were obtained through the Lumbar spine after the intrathecal infusion of contrast. Coronal and sagittal reconstructions were obtained of the axial image sets. COMPARISON:  Lumbar spine MRI 11/21/2022 FINDINGS: LUMBAR MYELOGRAM FINDINGS: There are 5 non rib-bearing lumbar type vertebrae. Trace anterolisthesis of L4 on L5 minimally increases with flexion and reduces with extension. L3-4 fusion is noted with the spinal canal appearing patent at this level. A moderate-sized ventral extradural defect at L2-3 slightly enlarges with standing and likely results in at least mild spinal stenosis with underfilling of the L3 nerve roots. A ventral extradural defect is also noted at T12-L1 with mild spinal stenosis. CT LUMBAR MYELOGRAM FINDINGS: Vertebral alignment is near anatomic on this supine CT. No acute fracture or suspicious osseous lesion is identified. L3-4 posterior and interbody fusion is again noted with evidence of some bridging bone across the posterior aspect of the disc space. There is no evidence of screw loosening. The conus medullaris terminates at L1-2. There is abdominal aortic atherosclerosis without aneurysm. T9-10: Moderate disc space narrowing with vacuum disc phenomena. Disc bulging and mild facet arthrosis without stenosis. T10-11: Mild disc space narrowing with vacuum disc. Disc bulging and mild facet arthrosis result in borderline to mild spinal stenosis and mild right neural foraminal stenosis, similar to the prior MRI. T11-12: Mild disc space narrowing with vacuum disc. Disc bulging, a right foraminal to extraforaminal disc protrusion, ligamentum flavum hypertrophy, and severe facet arthrosis result in mild-to-moderate spinal  stenosis and severe right neural foraminal stenosis, similar to the prior MRI. T12-L1: Mild disc space narrowing. Disc bulging, a right foraminal to extraforaminal disc protrusion, and mild-to-moderate facet and ligamentum flavum hypertrophy result in borderline to mild spinal stenosis and moderate to severe right neural foraminal stenosis, stable to slightly progressed. L1-2: Normal disc. Moderate facet and ligamentum flavum hypertrophy without stenosis. L2-3: Mild disc space narrowing. Right laminectomy. Disc bulging and moderate facet hypertrophy result in mild bilateral lateral recess stenosis and moderate to severe bilateral neural foraminal stenosis, stable to mildly progressed. L3-4: Wide posterior decompression and fusion. Patent spinal canal and left neural foramen. Likely mild residual right neural foraminal narrowing. L4-5: Posterior decompression. Disc bulging and moderate facet arthrosis result in moderate bilateral neural foraminal stenosis without significant residual spinal stenosis, unchanged. L5-S1: Disc bulging, congenitally short pedicles, and moderate facet and ligamentum flavum hypertrophy result in moderate spinal stenosis, more prominent than what was shown on the prior MRI. Mild-to-moderate bilateral neural foraminal stenosis. IMPRESSION: 1. L3-4 fusion without residual  spinal stenosis. Moderate bilateral neural foraminal stenosis. 2. Moderate spinal stenosis and mild-to-moderate neural foraminal stenosis at L5-S1. 3. Mild spinal stenosis at L2-3 with standing. Moderate to severe bilateral neural foraminal stenosis. 4. Mild-to-moderate spinal stenosis and severe right neural foraminal stenosis at T11-12. 5. Mild spinal stenosis and moderate to severe right neural foraminal stenosis at T12-L1. 6.  Aortic Atherosclerosis (ICD10-I70.0). Electronically Signed   By: Sebastian Ache M.D.   On: 02/04/2023 16:05   DG MYELOGRAPHY LUMBAR INJ LUMBOSACRAL  Result Date: 02/04/2023 CLINICAL DATA:  Lumbar  adjacent segment disease with spondylolisthesis. Prior lumbar fusion. Low back and bilateral leg pain after a fall. EXAM: LUMBAR MYELOGRAM FLUOROSCOPY: Radiation Exposure Index (as provided by the fluoroscopic device): 21.0 mGy Kerma PROCEDURE: After thorough discussion of risks and benefits of the procedure including bleeding, infection, injury to nerves, blood vessels, adjacent structures as well as headache and CSF leak, written and oral informed consent was obtained. Consent was obtained by Dr. Sebastian Ache. Time out form was completed. Patient was positioned prone on the fluoroscopy table. Local anesthesia was provided with 1% lidocaine without epinephrine after prepped and draped in the usual sterile fashion. Puncture was performed at L4-5 using a 5 inch 22-gauge spinal needle via a midline approach. Using a single pass through the dura, the needle was placed within the thecal sac, with return of clear CSF. 15 mL of Isovue M-200 was injected into the thecal sac, with normal opacification of the nerve roots and cauda equina consistent with free flow within the subarachnoid space. I personally performed the lumbar puncture and administered the intrathecal contrast. I also personally supervised acquisition of the myelogram images. TECHNIQUE: Contiguous axial images were obtained through the Lumbar spine after the intrathecal infusion of contrast. Coronal and sagittal reconstructions were obtained of the axial image sets. COMPARISON:  Lumbar spine MRI 11/21/2022 FINDINGS: LUMBAR MYELOGRAM FINDINGS: There are 5 non rib-bearing lumbar type vertebrae. Trace anterolisthesis of L4 on L5 minimally increases with flexion and reduces with extension. L3-4 fusion is noted with the spinal canal appearing patent at this level. A moderate-sized ventral extradural defect at L2-3 slightly enlarges with standing and likely results in at least mild spinal stenosis with underfilling of the L3 nerve roots. A ventral extradural defect  is also noted at T12-L1 with mild spinal stenosis. CT LUMBAR MYELOGRAM FINDINGS: Vertebral alignment is near anatomic on this supine CT. No acute fracture or suspicious osseous lesion is identified. L3-4 posterior and interbody fusion is again noted with evidence of some bridging bone across the posterior aspect of the disc space. There is no evidence of screw loosening. The conus medullaris terminates at L1-2. There is abdominal aortic atherosclerosis without aneurysm. T9-10: Moderate disc space narrowing with vacuum disc phenomena. Disc bulging and mild facet arthrosis without stenosis. T10-11: Mild disc space narrowing with vacuum disc. Disc bulging and mild facet arthrosis result in borderline to mild spinal stenosis and mild right neural foraminal stenosis, similar to the prior MRI. T11-12: Mild disc space narrowing with vacuum disc. Disc bulging, a right foraminal to extraforaminal disc protrusion, ligamentum flavum hypertrophy, and severe facet arthrosis result in mild-to-moderate spinal stenosis and severe right neural foraminal stenosis, similar to the prior MRI. T12-L1: Mild disc space narrowing. Disc bulging, a right foraminal to extraforaminal disc protrusion, and mild-to-moderate facet and ligamentum flavum hypertrophy result in borderline to mild spinal stenosis and moderate to severe right neural foraminal stenosis, stable to slightly progressed. L1-2: Normal disc. Moderate facet and ligamentum flavum hypertrophy  without stenosis. L2-3: Mild disc space narrowing. Right laminectomy. Disc bulging and moderate facet hypertrophy result in mild bilateral lateral recess stenosis and moderate to severe bilateral neural foraminal stenosis, stable to mildly progressed. L3-4: Wide posterior decompression and fusion. Patent spinal canal and left neural foramen. Likely mild residual right neural foraminal narrowing. L4-5: Posterior decompression. Disc bulging and moderate facet arthrosis result in moderate  bilateral neural foraminal stenosis without significant residual spinal stenosis, unchanged. L5-S1: Disc bulging, congenitally short pedicles, and moderate facet and ligamentum flavum hypertrophy result in moderate spinal stenosis, more prominent than what was shown on the prior MRI. Mild-to-moderate bilateral neural foraminal stenosis. IMPRESSION: 1. L3-4 fusion without residual spinal stenosis. Moderate bilateral neural foraminal stenosis. 2. Moderate spinal stenosis and mild-to-moderate neural foraminal stenosis at L5-S1. 3. Mild spinal stenosis at L2-3 with standing. Moderate to severe bilateral neural foraminal stenosis. 4. Mild-to-moderate spinal stenosis and severe right neural foraminal stenosis at T11-12. 5. Mild spinal stenosis and moderate to severe right neural foraminal stenosis at T12-L1. 6.  Aortic Atherosclerosis (ICD10-I70.0). Electronically Signed   By: Sebastian Ache M.D.   On: 02/04/2023 16:05    ASSESSMENT & PLAN:   Other neutropenia (HCC) #Leukopenia- neutropenia/mild intermittent anemia [MAY 2024- [facility]- 0.94] normal /platelets. Patient is asymptomatic-no increased risk of infections. Suspect benign causes rather than any malignant causes.   # Discussed possibility autoimmune etiology; less likely  medication induced; [cimzia] intrinsic bone marrow disorders; benign ethnic neutropenia [most likely]. No clinical evidence of hepatosplenomegaly or liver disease.However,    # Recommend checking CBC CMP LDH;B12; folic acid; HIV, Hep C; Hep B;  We will review peripheral smear. I discussed the possibility/need for a bone marrow biopsy if significant neutropenia is continued. However we will plan to hold bone marrow biopsy for now.   # Psoriatic arthrtitis/ fibromyalgia- [on Cimzia q 54m]-   Thank you, Dr.Patel for allowing me to participate in the care of your pleasant patient. Please do not hesitate to contact me with questions or concerns in the interim.  # DISPOSITION: # labs-  ordered- CBC CMP LDH;B12; folic acid; HIV, Hep C; Hep B; US abdomen.  # follow up in 1-2 weeks- MD; no labs- -Dr.B  All questions were answered. The patient knows to call the clinic with any problems, questions or concerns.    Earna Coder, MD 02/05/2023 4:51 PM

## 2023-02-06 ENCOUNTER — Telehealth: Payer: Self-pay | Admitting: *Deleted

## 2023-02-06 LAB — HAPTOGLOBIN: Haptoglobin: 67 mg/dL (ref 37–355)

## 2023-02-06 NOTE — Telephone Encounter (Signed)
Attempted to call for post procedure follow-up. Message left. 

## 2023-02-10 DIAGNOSIS — Z96651 Presence of right artificial knee joint: Secondary | ICD-10-CM | POA: Diagnosis not present

## 2023-02-10 DIAGNOSIS — I10 Essential (primary) hypertension: Secondary | ICD-10-CM | POA: Diagnosis not present

## 2023-02-10 DIAGNOSIS — F325 Major depressive disorder, single episode, in full remission: Secondary | ICD-10-CM | POA: Diagnosis not present

## 2023-02-10 DIAGNOSIS — E118 Type 2 diabetes mellitus with unspecified complications: Secondary | ICD-10-CM | POA: Diagnosis not present

## 2023-02-13 ENCOUNTER — Ambulatory Visit
Admission: RE | Admit: 2023-02-13 | Discharge: 2023-02-13 | Disposition: A | Discharge status: Home / Self Care | Payer: Medicare HMO | Source: Ambulatory Visit | Attending: Internal Medicine | Admitting: Internal Medicine

## 2023-02-13 ENCOUNTER — Other Ambulatory Visit: Payer: Self-pay | Admitting: Physician Assistant

## 2023-02-13 DIAGNOSIS — R161 Splenomegaly, not elsewhere classified: Secondary | ICD-10-CM | POA: Diagnosis not present

## 2023-02-13 DIAGNOSIS — D708 Other neutropenia: Secondary | ICD-10-CM | POA: Insufficient documentation

## 2023-02-13 DIAGNOSIS — Z96651 Presence of right artificial knee joint: Secondary | ICD-10-CM

## 2023-02-14 ENCOUNTER — Other Ambulatory Visit: Payer: Medicare HMO

## 2023-02-18 ENCOUNTER — Ambulatory Visit (INDEPENDENT_AMBULATORY_CARE_PROVIDER_SITE_OTHER): Payer: Medicare HMO | Admitting: Vascular Surgery

## 2023-02-18 ENCOUNTER — Encounter (INDEPENDENT_AMBULATORY_CARE_PROVIDER_SITE_OTHER): Payer: Self-pay | Admitting: Vascular Surgery

## 2023-02-18 ENCOUNTER — Inpatient Hospital Stay (HOSPITAL_BASED_OUTPATIENT_CLINIC_OR_DEPARTMENT_OTHER): Payer: Medicare HMO | Admitting: Internal Medicine

## 2023-02-18 VITALS — BP 137/81 | HR 78 | Resp 18 | Ht 71.0 in | Wt 267.4 lb

## 2023-02-18 VITALS — BP 138/72 | HR 72 | Temp 97.6°F | Resp 18 | Wt 272.0 lb

## 2023-02-18 DIAGNOSIS — E1142 Type 2 diabetes mellitus with diabetic polyneuropathy: Secondary | ICD-10-CM | POA: Diagnosis not present

## 2023-02-18 DIAGNOSIS — L4050 Arthropathic psoriasis, unspecified: Secondary | ICD-10-CM | POA: Diagnosis not present

## 2023-02-18 DIAGNOSIS — I10 Essential (primary) hypertension: Secondary | ICD-10-CM | POA: Diagnosis not present

## 2023-02-18 DIAGNOSIS — Z79899 Other long term (current) drug therapy: Secondary | ICD-10-CM | POA: Diagnosis not present

## 2023-02-18 DIAGNOSIS — E118 Type 2 diabetes mellitus with unspecified complications: Secondary | ICD-10-CM

## 2023-02-18 DIAGNOSIS — I890 Lymphedema, not elsewhere classified: Secondary | ICD-10-CM | POA: Diagnosis not present

## 2023-02-18 DIAGNOSIS — F325 Major depressive disorder, single episode, in full remission: Secondary | ICD-10-CM | POA: Diagnosis not present

## 2023-02-18 DIAGNOSIS — D708 Other neutropenia: Secondary | ICD-10-CM | POA: Diagnosis not present

## 2023-02-18 DIAGNOSIS — Z809 Family history of malignant neoplasm, unspecified: Secondary | ICD-10-CM | POA: Diagnosis not present

## 2023-02-18 DIAGNOSIS — E785 Hyperlipidemia, unspecified: Secondary | ICD-10-CM | POA: Diagnosis not present

## 2023-02-18 DIAGNOSIS — I700 Atherosclerosis of aorta: Secondary | ICD-10-CM | POA: Diagnosis not present

## 2023-02-18 DIAGNOSIS — E042 Nontoxic multinodular goiter: Secondary | ICD-10-CM | POA: Diagnosis not present

## 2023-02-18 DIAGNOSIS — Z87891 Personal history of nicotine dependence: Secondary | ICD-10-CM | POA: Diagnosis not present

## 2023-02-18 DIAGNOSIS — M2550 Pain in unspecified joint: Secondary | ICD-10-CM | POA: Diagnosis not present

## 2023-02-18 NOTE — Assessment & Plan Note (Signed)
lipid control important in reducing the progression of atherosclerotic disease. Continue statin therapy  

## 2023-02-18 NOTE — Assessment & Plan Note (Addendum)
#  Leukopenia- neutropenia/mild intermittent anemia [MAY 2024- Rheum [facility]- 0.94] normal /platelets.  June 2024 white count 2.4 ANC of 2; lymphopenia 0.5. negative for LDH;B12; folic acid; HIV, Hep C; Hep B.  June 2024- US Abdomen- Increased hepatic parenchymal echogenicity suggestive of steatosis. No splenomegaly.   # Patient is asymptomatic-benign ethnic neutropenia/lympopenia [most likely benign]. No clinical evidence of hepatosplenomegaly or liver disease.  As patient is asymptomatic I think is reasonable to hold off further workup like bone marrow biopsy for now.  Again will reevaluate again in 6 months.  # June 2024- US Abdomen- Increased hepatic parenchymal echogenicity suggestive of steatosis/fattty liver; discussed re: risk of cirrhosis.  on Monjauro/ defer to PCP. Discussed re: weight loss.   # Psoriatic arthrtitis/ fibromyalgia- [on Cimzia q 46m]- Unlikely cause of her leucopenia; monitor for now.   # DISPOSITION: # follow up in 6 month  2024- MD;labs-cbc/cmp -Dr.B

## 2023-02-18 NOTE — Progress Notes (Addendum)
MRN : [id number]  Christine Higgins is a 67 y.o. (Nov 28, 1955) female who presents with chief complaint of  Chief Complaint  Patient presents with   Follow-up    f/u in 6 months with no studies  .  History of Present Illness: Patient returns today in follow up of her leg swelling and lymphedema.  She has been diligently wearing her compression socks, elevating her legs, and trying to exercise daily over the past 6 months.  She is using 20 to 30 mmHg compression socks.  This has resulted in minimal improvement in her swelling in the lower extremities.  Her legs are heavy and tired.  They affect her on a daily basis.  A previously performed venous study showed no significant venous disease requiring treatment.  Current Outpatient Medications  Medication Sig Dispense Refill   acetaminophen (TYLENOL) 325 MG tablet Take 650 mg by mouth every 4 (four) hours as needed for mild pain, fever or headache.     albuterol (VENTOLIN HFA) 108 (90 Base) MCG/ACT inhaler Inhale 1 puff into the lungs every 4 (four) hours as needed for shortness of breath or wheezing.     amLODipine (NORVASC) 5 MG tablet Take 5 mg by mouth daily.     atorvastatin (LIPITOR) 10 MG tablet Take 10 mg by mouth daily after supper.      budesonide-formoterol (SYMBICORT) 160-4.5 MCG/ACT inhaler Inhale 2 puffs into the lungs 2 (two) times daily.     calcium carbonate (OS-CAL) 600 MG TABS tablet Take 600 mg by mouth every other day.     celecoxib (CELEBREX) 200 MG capsule Take 200 mg by mouth 2 (two) times daily.     Cholecalciferol (VITAMIN D3 SUPER STRENGTH) 50 MCG (2000 UT) TABS Take 3,000 Units by mouth daily.     clonazePAM (KLONOPIN) 0.5 MG tablet Take 0.5 mg by mouth at bedtime as needed for anxiety.     DULoxetine (CYMBALTA) 30 MG capsule Take 30 mg by mouth at bedtime.     DULoxetine (CYMBALTA) 60 MG capsule Take 60 mg by mouth daily.     esomeprazole (NEXIUM) 40 MG capsule Take 40 mg by mouth 2 (two) times daily.       fluticasone (FLONASE) 50 MCG/ACT nasal spray Place 2 sprays into both nostrils daily.     gabapentin (NEURONTIN) 600 MG tablet Take 300 mg by mouth in the morning, at noon, and at bedtime.     HYDROcodone-acetaminophen (NORCO/VICODIN) 5-325 MG tablet Take 1 tablet by mouth every 6 (six) hours as needed for moderate pain.     ibandronate (BONIVA) 150 MG tablet Take 150 mg by mouth every 30 (thirty) days.     levocetirizine (XYZAL) 5 MG tablet Take 5 mg by mouth every evening.     linaclotide (LINZESS) 290 MCG CAPS capsule Take 290 mcg by mouth daily.     losartan (COZAAR) 100 MG tablet Take 100 mg by mouth daily.     methimazole (TAPAZOLE) 5 MG tablet Take 5 mg by mouth daily.     methocarbamol 1000 MG TABS Take 1,000 mg by mouth 3 (three) times daily. (Patient taking differently: Take 1,000 mg by mouth in the morning and at bedtime.)     MOUNJARO 5 MG/0.5ML Pen Inject 5 mg into the skin once a week.     Multiple Vitamin (MULTIVITAMIN WITH MINERALS) TABS tablet Take 1 tablet by mouth daily.     Naloxone HCl (NARCAN NA) Place 4 mg into the nose 3 (three)  times daily as needed.     potassium chloride (KLOR-CON) 10 MEQ tablet Take 10 mEq by mouth daily.     pregabalin (LYRICA) 50 MG capsule Take 50 mg by mouth 2 (two) times daily.     propranolol (INDERAL) 80 MG tablet Take 80 mg by mouth 2 (two) times daily.     sucralfate (CARAFATE) 1 g tablet Take 1 g by mouth 4 (four) times daily -  before meals and at bedtime.     torsemide (DEMADEX) 10 MG tablet Take 10 mg by mouth daily.     traZODone (DESYREL) 150 MG tablet Take 75 mg by mouth at bedtime as needed for sleep.      No current facility-administered medications for this visit.    Past Medical History:  Diagnosis Date   Anxiety    Asthma    Breast mass    LEFT x 3 months per pt and palpated by physician   Cervical disc disease    Chronic pain syndrome    COPD (chronic obstructive pulmonary disease) (HCC)    Degenerative disc disease,  lumbar    osteoarthritis   Depression    Diabetes mellitus without complication (HCC)    Fibromyalgia    Fibromyalgia    GERD (gastroesophageal reflux disease)    Glaucoma    Graves disease    Headache    migraines   Hemorrhoids    History of hiatal hernia    Hyperlipidemia    Hypertension    Hyperthyroidism    Lumbar disc disease    Obesity    Pre-diabetes    Psoriatic arthritis (HCC)    Thyroid disease     Past Surgical History:  Procedure Laterality Date   BACK SURGERY     sumbar   CARDIAC CATHETERIZATION  2006   carpel tunn Right    carpel tunnel Left    CATARACT EXTRACTION Bilateral 2022   CESAREAN SECTION     COLONOSCOPY     COLONOSCOPY WITH PROPOFOL N/A 10/20/2020   Procedure: COLONOSCOPY WITH PROPOFOL;  Surgeon: Regis Bill, MD;  Location: [facility];  Service: Endoscopy;  Laterality: N/A;   DILATATION & CURETTAGE/HYSTEROSCOPY WITH MYOSURE N/A 10/24/2022   Procedure: DILATATION & CURETTAGE/HYSTEROSCOPY;  Surgeon: Schermerhorn, Ihor Austin, MD;  Location: [facility];  Service: Gynecology;  Laterality: N/A;   ESOPHAGOGASTRODUODENOSCOPY (EGD) WITH PROPOFOL N/A 06/26/2017   Procedure: ESOPHAGOGASTRODUODENOSCOPY (EGD) WITH PROPOFOL;  Surgeon: Christena Deem, MD;  Location: [facility] END[facility];  Service: Endoscopy;  Laterality: N/A;   EXCISION MORTON'S NEUROMA Left 02/05/2017   Procedure: EXCISION MORTON'S NEUROMA;  Surgeon: Gwyneth Revels, DPM;  Location: [facility];  Service: Podiatry;  Laterality: Left;  iva with local   FRACTURE SURGERY  1969   Left Elbow   HAND SURGERY Right    scar tissue removal   JOINT REPLACEMENT     right hip arthroplasty 08/25/15   KNEE ARTHROPLASTY Left 08/11/2017   Procedure: COMPUTER ASSISTED TOTAL KNEE ARTHROPLASTY;  Surgeon: Donato Heinz, MD;  Location: [facility];  Service: Orthopedics;  Laterality: Left;   KNEE ARTHROPLASTY Right 02/05/2021   Procedure: COMPUTER ASSISTED TOTAL KNEE ARTHROPLASTY;  Surgeon: Donato Heinz, MD;  Location: [facility];  Service: Orthopedics;  Laterality: Right;   KNEE ARTHROSCOPY Right 07/02/2018   Procedure: ARTHROSCOPY KNEE, Medial and Lateral  Chondroplasty;  Surgeon: Donato Heinz, MD;  Location: [facility];  Service: Orthopedics;  Laterality: Right;   LUMBAR LAMINECTOMY/DECOMPRESSION MICRODISCECTOMY N/A 06/19/2022   Procedure: EXPLORE  LUMBAR WOUND FOR EVACUATION OF HEMATOMA;  Surgeon: Tressie Stalker, MD;  Location: [facility];  Service: Neurosurgery;  Laterality: N/A;   NECK SURGERY     "disk implant"   REVERSE SHOULDER ARTHROPLASTY Left 06/24/2019   Procedure: REVERSE SHOULDER ARTHROPLASTY;  Surgeon: Christena Flake, MD;  Location: [facility];  Service: Orthopedics;  Laterality: Left;   SHOULDER SURGERY Right    spur removal   SPINAL CORD STIMULATOR REMOVAL N/A 04/09/2021   Procedure: REMOVAL SPINAL CORD STIMULATOR & PULSE GENERATOR;  Surgeon: Lucy Chris, MD;  Location: [facility];  Service: Neurosurgery;  Laterality: N/A;  2nd case   THORACIC LAMINECTOMY FOR SPINAL CORD STIMULATOR N/A 08/07/2020   Procedure: THORACIC SPINAL CORD STIMULATOR VIA LAMINECTOMY, PULSE GENERATOR;  Surgeon: Lucy Chris, MD;  Location: [facility];  Service: Neurosurgery;  Laterality: N/A;   TOTAL HIP ARTHROPLASTY Right    TUBAL LIGATION       Social History   Tobacco Use   Smoking status: Former    Types: Cigarettes    Quit date: 1994    Years since quitting: 30.5   Smokeless tobacco: Never  Vaping Use   Vaping Use: Never used  Substance Use Topics   Alcohol use: No    Alcohol/week: 0.0 standard drinks of alcohol   Drug use: Not Currently    Types: "Crack" cocaine, Marijuana    Comment: 1991       Family History  Problem Relation Age of Onset   Cancer Mother    Heart disease Father    Breast cancer Neg Hx      Allergies  Allergen Reactions   Cephalexin Hives   Ibuprofen Nausea And Vomiting    Flu like symptoms   Potassium Chloride Itching   Shellfish Allergy Nausea And  Vomiting   Aspirin Nausea And Vomiting     REVIEW OF SYSTEMS (Negative unless checked)  Constitutional: [] Weight loss  [] Fever  [] Chills Cardiac: [] Chest pain   [] Chest pressure   [] Palpitations   [] Shortness of breath when laying flat   [] Shortness of breath at rest   [] Shortness of breath with exertion. Vascular:  [x] Pain in legs with walking   [] Pain in legs at rest   [] Pain in legs when laying flat   [] Claudication   [] Pain in feet when walking  [] Pain in feet at rest  [] Pain in feet when laying flat   [] History of DVT   [] Phlebitis   [x] Swelling in legs   [] Varicose veins   [] Non-healing ulcers Pulmonary:   [] Uses home oxygen   [] Productive cough   [] Hemoptysis   [] Wheeze  [] COPD   [] Asthma Neurologic:  [] Dizziness  [] Blackouts   [] Seizures   [] History of stroke   [] History of TIA  [] Aphasia   [] Temporary blindness   [] Dysphagia   [] Weakness or numbness in arms   [] Weakness or numbness in legs Musculoskeletal:  [] Arthritis   [] Joint swelling   [] Joint pain   [] Low back pain Hematologic:  [] Easy bruising  [] Easy bleeding   [] Hypercoagulable state   [] Anemic   Gastrointestinal:  [] Blood in stool   [] Vomiting blood  [] Gastroesophageal reflux/heartburn   [] Abdominal pain Genitourinary:  [] Chronic kidney disease   [] Difficult urination  [] Frequent urination  [] Burning with urination   [] Hematuria Skin:  [] Rashes   [] Ulcers   [] Wounds Psychological:  [] History of anxiety   []  History of major depression.  Physical Examination  BP 137/81 (BP Location: Left Arm)   Pulse 78   Resp 18   Ht  5\' 11"  (1.803 m)   Wt 267 lb 6.4 oz (121.3 kg)   BMI 37.29 kg/m  Gen:  WD/WN, NAD Head: [place]/AT, No temporalis wasting. Ear/Nose/Throat: Hearing grossly intact, nares w/o erythema or drainage Eyes: Conjunctiva clear. Sclera non-icteric Neck: Supple.  Trachea midline Pulmonary:  Good air movement, no use of accessory muscles.  Cardiac: RRR, no JVD Vascular:  Vessel Right Left  Radial Palpable Palpable                           PT Palpable Palpable  DP Palpable Palpable   Gastrointestinal: soft, non-tender/non-distended. No guarding/reflex.  Musculoskeletal: M/S 5/5 throughout.  No deformity or atrophy. 1+ BLE edema, a little worse on the right than the left. Hyperpigmentation present bilaterally.  Neurologic: Sensation grossly intact in extremities.  Symmetrical.  Speech is fluent.  Psychiatric: Judgment intact, Mood & affect appropriate for pt's clinical situation. Dermatologic: No rashes or ulcers noted.  No cellulitis or open wounds.      Labs Recent Results (from the past 2160 hour(s))  Technologist smear review     Status: None   Collection Time: 02/05/23  2:47 PM  Result Value Ref Range   WBC MORPHOLOGY MORPHOLOGY UNREMARKABLE    RBC MORPHOLOGY MORPHOLOGY UNREMARKABLE    Plt Morphology Normal platelet morphology     Comment: PLATELETS APPEAR ADEQUATE   Clinical Information Anemia/Leucopenia     Comment: Performed at [facility], [address] [place], [place] [address]  C-reactive protein     Status: None   Collection Time: 02/05/23  2:47 PM  Result Value Ref Range   CRP <0.5 <1.0 mg/dL    Comment: Performed at [facility], [address] [place], [place] [address]  Comprehensive metabolic panel     Status: Abnormal   Collection Time: 02/05/23  2:47 PM  Result Value Ref Range   Sodium 135 135 - 145 mmol/L   Potassium 4.0 3.5 - 5.1 mmol/L   Chloride 100 98 - 111 mmol/L   CO2 26 22 - 32 mmol/L   Glucose, Bld 117 (H) 70 - 99 mg/dL    Comment: Glucose reference range applies only to samples taken after fasting for at least 8 hours.   BUN 7 (L) 8 - 23 mg/dL   Creatinine, Ser 8.65 0.44 - 1.00 mg/dL   Calcium 78.4 8.9 - 69.6 mg/dL   Total Protein 8.3 (H) 6.5 - 8.1 g/dL   Albumin 5.1 (H) 3.5 - 5.0 g/dL   AST 23 15 - 41 U/L   ALT 20 0 - 44 U/L   Alkaline Phosphatase 89 38 - 126 U/L   Total Bilirubin 0.5 0.3 - 1.2 mg/dL   GFR, Estimated >29 >52  mL/min    Comment: (NOTE) Calculated using the CKD-EPI Creatinine Equation (2021)    Anion gap 9 5 - 15    Comment: Performed at [facility], [address] [place], [place] [address]  Hepatitis B core antibody, IgM     Status: None   Collection Time: 02/05/23  2:47 PM  Result Value Ref Range   Hep B C IgM NON REACTIVE NON REACTIVE    Comment: Performed at [facility], [address] [place], [place] [address]  Hepatitis C antibody     Status: None   Collection Time: 02/05/23  2:47 PM  Result Value Ref Range   HCV Ab NON REACTIVE NON REACTIVE    Comment: (  NOTE) Nonreactive HCV antibody screen is consistent with no HCV infections,  unless recent infection is suspected or other evidence exists to indicate HCV infection.  Performed at [facility], [address], [place], [place] [address]   HIV Antibody (routine testing w rflx)     Status: None   Collection Time: 02/05/23  2:47 PM  Result Value Ref Range   HIV Screen 4th Generation wRfx Non Reactive Non Reactive    Comment: Performed at [facility], [address], [place], [place] [address]  Iron and TIBC     Status: None   Collection Time: 02/05/23  2:47 PM  Result Value Ref Range   Iron 77 28 - 170 ug/dL   TIBC 098 119 - 147 ug/dL   Saturation Ratios 17 10.4 - 31.8 %   UIBC 372 ug/dL    Comment: Performed at [facility], [address] [place], [place] [address]  Vitamin B12     Status: None   Collection Time: 02/05/23  2:47 PM  Result Value Ref Range   Vitamin B-12 534 180 - 914 pg/mL    Comment: (NOTE) This assay is not validated for testing neonatal or myeloproliferative syndrome specimens for Vitamin B12 levels. Performed at [facility], [address], [place], [place] [address]   Hepatitis B surface antigen     Status: None   Collection Time: 02/05/23  2:47 PM  Result Value Ref Range   Hepatitis B Surface Ag NON REACTIVE NON REACTIVE    Comment: Performed at  [facility], [address], [place], [place] [address]  Reticulocytes     Status: None   Collection Time: 02/05/23  2:47 PM  Result Value Ref Range   Retic Ct Pct 1.2 0.4 - 3.1 %   RBC. 5.03 3.87 - 5.11 MIL/uL   Retic Count, Absolute 60.9 19.0 - 186.0 K/uL   Immature Retic Fract 7.1 2.3 - 15.9 %    Comment: Performed at [facility], [address] [place], [place] [address]  Haptoglobin     Status: None   Collection Time: 02/05/23  2:47 PM  Result Value Ref Range   Haptoglobin 67 37 - 355 mg/dL    Comment: (NOTE) Performed At: [facility] [address] [place], [place] [id number] Jolene Schimke MD WN:[id number]   Folate     Status: None   Collection Time: 02/05/23  2:47 PM  Result Value Ref Range   Folate 26.0 >5.9 ng/mL    Comment: Performed at [facility], [address] [place], [place] [address]  Lactate dehydrogenase     Status: None   Collection Time: 02/05/23  2:47 PM  Result Value Ref Range   LDH 174 98 - 192 U/L    Comment: Performed at [facility], [address] [place], [place] [address]  Ferritin     Status: None   Collection Time: 02/05/23  2:47 PM  Result Value Ref Range   Ferritin 48 11 - 307 ng/mL    Comment: Performed at [facility], [address], [place], [place] [address]  CBC with Differential/Platelet     Status: Abnormal   Collection Time: 02/05/23  2:47 PM  Result Value Ref Range   WBC 2.7 (L) 4.0 - 10.5 K/uL   RBC 5.05 3.87 - 5.11 MIL/uL   Hemoglobin 13.7 12.0 - 15.0 g/dL   HCT 43.3 29.5 - 18.8 %   MCV 83.4  80.0 - 100.0 fL   MCH 27.1 26.0 - 34.0 pg   MCHC 32.5 30.0 - 36.0 g/dL   RDW 09.8 11.9 - 14.7 %   Platelets 211 150 - 400 K/uL   nRBC 0.0 0.0 - 0.2 %   Neutrophils Relative % 75 %   Neutro Abs 2.0 1.7 - 7.7 K/uL   Lymphocytes Relative 20 %   Lymphs Abs 0.5 (L) 0.7 - 4.0 K/uL   Monocytes Relative 4 %   Monocytes Absolute 0.1 0.1 - 1.0 K/uL   Eosinophils Relative 1 %    Eosinophils Absolute 0.0 0.0 - 0.5 K/uL   Basophils Relative 0 %   Basophils Absolute 0.0 0.0 - 0.1 K/uL   Immature Granulocytes 0 %   Abs Immature Granulocytes 0.01 0.00 - 0.07 K/uL    Comment: Performed at [facility], [address] [place], [place] [address]    Radiology US Abdomen Complete  Result Date: 02/13/2023 CLINICAL DATA:  Cirrhosis.  Splenomegaly. EXAM: ABDOMEN ULTRASOUND COMPLETE COMPARISON:  CT lumbar spine 02/04/2023 FINDINGS: Gallbladder: No gallstones or wall thickening visualized. No sonographic Murphy sign noted by sonographer. Common bile duct: Diameter: 6 mm, upper limits of normal. Liver: Increased echogenicity. No focal lesion. Portal vein is patent on color Doppler imaging with normal direction of blood flow towards the liver. IVC: No abnormality visualized. Pancreas: Visualized portion unremarkable. Spleen: Size and appearance within normal limits. Right Kidney: Length: 12.2 cm. Echogenicity within normal limits. No mass or hydronephrosis visualized. Left Kidney: Length: 10.1 cm. Echogenicity within normal limits. No mass or hydronephrosis visualized. Abdominal aorta: No aneurysm visualized. Other findings: None. IMPRESSION: 1. Increased hepatic parenchymal echogenicity suggestive of steatosis. 2. No cholelithiasis or sonographic evidence for acute cholecystitis. Electronically Signed   By: Annia Belt M.D.   On: 02/13/2023 13:13   DG PAIN CLINIC C-ARM 1-60 MIN NO REPORT  Result Date: 02/05/2023 Fluoro was used, but no Radiologist interpretation will be provided. Please refer to "NOTES" tab for provider progress note.  CT LUMBAR SPINE W CONTRAST  Result Date: 02/04/2023 CLINICAL DATA:  Lumbar adjacent segment disease with spondylolisthesis. Prior lumbar fusion. Low back and bilateral leg pain after a fall. EXAM: LUMBAR MYELOGRAM FLUOROSCOPY: Radiation Exposure Index (as provided by the fluoroscopic device): 21.0 mGy Kerma PROCEDURE: After thorough discussion of  risks and benefits of the procedure including bleeding, infection, injury to nerves, blood vessels, adjacent structures as well as headache and CSF leak, written and oral informed consent was obtained. Consent was obtained by Dr. Sebastian Ache. Time out form was completed. Patient was positioned prone on the fluoroscopy table. Local anesthesia was provided with 1% lidocaine without epinephrine after prepped and draped in the usual sterile fashion. Puncture was performed at L4-5 using a 5 inch 22-gauge spinal needle via a midline approach. Using a single pass through the dura, the needle was placed within the thecal sac, with return of clear CSF. 15 mL of Isovue M-200 was injected into the thecal sac, with normal opacification of the nerve roots and cauda equina consistent with free flow within the subarachnoid space. I personally performed the lumbar puncture and administered the intrathecal contrast. I also personally supervised acquisition of the myelogram images. TECHNIQUE: Contiguous axial images were obtained through the Lumbar spine after the intrathecal infusion of contrast. Coronal and sagittal reconstructions were obtained of the axial image sets. COMPARISON:  Lumbar spine MRI 11/21/2022 FINDINGS: LUMBAR MYELOGRAM FINDINGS: There are 5 non rib-bearing lumbar type vertebrae. Trace anterolisthesis of L4  on L5 minimally increases with flexion and reduces with extension. L3-4 fusion is noted with the spinal canal appearing patent at this level. A moderate-sized ventral extradural defect at L2-3 slightly enlarges with standing and likely results in at least mild spinal stenosis with underfilling of the L3 nerve roots. A ventral extradural defect is also noted at T12-L1 with mild spinal stenosis. CT LUMBAR MYELOGRAM FINDINGS: Vertebral alignment is near anatomic on this supine CT. No acute fracture or suspicious osseous lesion is identified. L3-4 posterior and interbody fusion is again noted with evidence of some  bridging bone across the posterior aspect of the disc space. There is no evidence of screw loosening. The conus medullaris terminates at L1-2. There is abdominal aortic atherosclerosis without aneurysm. T9-10: Moderate disc space narrowing with vacuum disc phenomena. Disc bulging and mild facet arthrosis without stenosis. T10-11: Mild disc space narrowing with vacuum disc. Disc bulging and mild facet arthrosis result in borderline to mild spinal stenosis and mild right neural foraminal stenosis, similar to the prior MRI. T11-12: Mild disc space narrowing with vacuum disc. Disc bulging, a right foraminal to extraforaminal disc protrusion, ligamentum flavum hypertrophy, and severe facet arthrosis result in mild-to-moderate spinal stenosis and severe right neural foraminal stenosis, similar to the prior MRI. T12-L1: Mild disc space narrowing. Disc bulging, a right foraminal to extraforaminal disc protrusion, and mild-to-moderate facet and ligamentum flavum hypertrophy result in borderline to mild spinal stenosis and moderate to severe right neural foraminal stenosis, stable to slightly progressed. L1-2: Normal disc. Moderate facet and ligamentum flavum hypertrophy without stenosis. L2-3: Mild disc space narrowing. Right laminectomy. Disc bulging and moderate facet hypertrophy result in mild bilateral lateral recess stenosis and moderate to severe bilateral neural foraminal stenosis, stable to mildly progressed. L3-4: Wide posterior decompression and fusion. Patent spinal canal and left neural foramen. Likely mild residual right neural foraminal narrowing. L4-5: Posterior decompression. Disc bulging and moderate facet arthrosis result in moderate bilateral neural foraminal stenosis without significant residual spinal stenosis, unchanged. L5-S1: Disc bulging, congenitally short pedicles, and moderate facet and ligamentum flavum hypertrophy result in moderate spinal stenosis, more prominent than what was shown on the  prior MRI. Mild-to-moderate bilateral neural foraminal stenosis. IMPRESSION: 1. L3-4 fusion without residual spinal stenosis. Moderate bilateral neural foraminal stenosis. 2. Moderate spinal stenosis and mild-to-moderate neural foraminal stenosis at L5-S1. 3. Mild spinal stenosis at L2-3 with standing. Moderate to severe bilateral neural foraminal stenosis. 4. Mild-to-moderate spinal stenosis and severe right neural foraminal stenosis at T11-12. 5. Mild spinal stenosis and moderate to severe right neural foraminal stenosis at T12-L1. 6.  Aortic Atherosclerosis (ICD10-I70.0). Electronically Signed   By: Sebastian Ache M.D.   On: 02/04/2023 16:05   DG MYELOGRAPHY LUMBAR INJ LUMBOSACRAL  Result Date: 02/04/2023 CLINICAL DATA:  Lumbar adjacent segment disease with spondylolisthesis. Prior lumbar fusion. Low back and bilateral leg pain after a fall. EXAM: LUMBAR MYELOGRAM FLUOROSCOPY: Radiation Exposure Index (as provided by the fluoroscopic device): 21.0 mGy Kerma PROCEDURE: After thorough discussion of risks and benefits of the procedure including bleeding, infection, injury to nerves, blood vessels, adjacent structures as well as headache and CSF leak, written and oral informed consent was obtained. Consent was obtained by Dr. Sebastian Ache. Time out form was completed. Patient was positioned prone on the fluoroscopy table. Local anesthesia was provided with 1% lidocaine without epinephrine after prepped and draped in the usual sterile fashion. Puncture was performed at L4-5 using a 5 inch 22-gauge spinal needle via a midline approach. Using a single  pass through the dura, the needle was placed within the thecal sac, with return of clear CSF. 15 mL of Isovue M-200 was injected into the thecal sac, with normal opacification of the nerve roots and cauda equina consistent with free flow within the subarachnoid space. I personally performed the lumbar puncture and administered the intrathecal contrast. I also personally  supervised acquisition of the myelogram images. TECHNIQUE: Contiguous axial images were obtained through the Lumbar spine after the intrathecal infusion of contrast. Coronal and sagittal reconstructions were obtained of the axial image sets. COMPARISON:  Lumbar spine MRI 11/21/2022 FINDINGS: LUMBAR MYELOGRAM FINDINGS: There are 5 non rib-bearing lumbar type vertebrae. Trace anterolisthesis of L4 on L5 minimally increases with flexion and reduces with extension. L3-4 fusion is noted with the spinal canal appearing patent at this level. A moderate-sized ventral extradural defect at L2-3 slightly enlarges with standing and likely results in at least mild spinal stenosis with underfilling of the L3 nerve roots. A ventral extradural defect is also noted at T12-L1 with mild spinal stenosis. CT LUMBAR MYELOGRAM FINDINGS: Vertebral alignment is near anatomic on this supine CT. No acute fracture or suspicious osseous lesion is identified. L3-4 posterior and interbody fusion is again noted with evidence of some bridging bone across the posterior aspect of the disc space. There is no evidence of screw loosening. The conus medullaris terminates at L1-2. There is abdominal aortic atherosclerosis without aneurysm. T9-10: Moderate disc space narrowing with vacuum disc phenomena. Disc bulging and mild facet arthrosis without stenosis. T10-11: Mild disc space narrowing with vacuum disc. Disc bulging and mild facet arthrosis result in borderline to mild spinal stenosis and mild right neural foraminal stenosis, similar to the prior MRI. T11-12: Mild disc space narrowing with vacuum disc. Disc bulging, a right foraminal to extraforaminal disc protrusion, ligamentum flavum hypertrophy, and severe facet arthrosis result in mild-to-moderate spinal stenosis and severe right neural foraminal stenosis, similar to the prior MRI. T12-L1: Mild disc space narrowing. Disc bulging, a right foraminal to extraforaminal disc protrusion, and  mild-to-moderate facet and ligamentum flavum hypertrophy result in borderline to mild spinal stenosis and moderate to severe right neural foraminal stenosis, stable to slightly progressed. L1-2: Normal disc. Moderate facet and ligamentum flavum hypertrophy without stenosis. L2-3: Mild disc space narrowing. Right laminectomy. Disc bulging and moderate facet hypertrophy result in mild bilateral lateral recess stenosis and moderate to severe bilateral neural foraminal stenosis, stable to mildly progressed. L3-4: Wide posterior decompression and fusion. Patent spinal canal and left neural foramen. Likely mild residual right neural foraminal narrowing. L4-5: Posterior decompression. Disc bulging and moderate facet arthrosis result in moderate bilateral neural foraminal stenosis without significant residual spinal stenosis, unchanged. L5-S1: Disc bulging, congenitally short pedicles, and moderate facet and ligamentum flavum hypertrophy result in moderate spinal stenosis, more prominent than what was shown on the prior MRI. Mild-to-moderate bilateral neural foraminal stenosis. IMPRESSION: 1. L3-4 fusion without residual spinal stenosis. Moderate bilateral neural foraminal stenosis. 2. Moderate spinal stenosis and mild-to-moderate neural foraminal stenosis at L5-S1. 3. Mild spinal stenosis at L2-3 with standing. Moderate to severe bilateral neural foraminal stenosis. 4. Mild-to-moderate spinal stenosis and severe right neural foraminal stenosis at T11-12. 5. Mild spinal stenosis and moderate to severe right neural foraminal stenosis at T12-L1. 6.  Aortic Atherosclerosis (ICD10-I70.0). Electronically Signed   By: Sebastian Ache M.D.   On: 02/04/2023 16:05    Assessment/Plan  Lymphedema The patient has stage II lymphedema refractory to 6 months of compression socks, elevation, and elevating her legs.  She has been compliant with her conservative measures. She has hyperpigmentation and skin changes.  She reports minimal  improvement with these measures.  She would clearly benefit from a lymphedema pump as an adjuvant therapy and we will try to obtain that for her in the near future at her convenience. RTC 6 months  Hyperlipidemia, unspecified lipid control important in reducing the progression of atherosclerotic disease. Continue statin therapy   DM II (diabetes mellitus, type II), controlled (HCC) blood glucose control important in reducing the progression of atherosclerotic disease. Also, involved in wound healing. On appropriate medications.   HTN, goal below 140/90 blood pressure control important in reducing the progression of atherosclerotic disease. On appropriate oral medications.    Festus Barren, MD  02/18/2023 3:28 PM    This note was created with Dragon medical transcription system.  Any errors from dictation are purely unintentional

## 2023-02-18 NOTE — Assessment & Plan Note (Addendum)
The patient has stage II lymphedema refractory to 6 months of compression socks, elevation, and elevating her legs.  She has been compliant with her conservative measures. She has hyperpigmentation and skin changes as well as swelling which has not improved.  After 4 weeks of daily exercise, elevation, and compression she sees no benefit but is significant.  She reports minimal improvement with these measures.  I recommend a lymphedema pump.  She would clearly benefit from a lymphedema pump as an adjuvant therapy and we will try to obtain that for her in the near future at her convenience. RTC 6 months

## 2023-02-18 NOTE — Progress Notes (Signed)
[facility] CONSULT NOTE  Patient Care Team: Lauro Regulus, MD as PCP - General (Internal Medicine)  # CHIEF COMPLAINTS/PURPOSE OF CONSULTATION: Leucopenia  # LEUCOPENIA/NEUTROPENIA- ANC; Hb; platelets; CT Ab/US; Hepatitis/HIV; Alcohol-none.   #  Oncology History   No history exists.   HISTORY OF PRESENTING ILLNESS: Patient ambulating-with assistance.  Alone.  Christine Higgins 67 y.o.  female with history of psoriatic arthritis fibromyalgia chronic neuropathy chronic pain is here for follow-up of her neutropenia/ and review labs/ Ultrasound.   Pt here for F/U neutropenia. No fevers. No signs of any infections. Pt ambulatory with walker. Appetite is good. Energy is fair    Review of Systems  Constitutional:  Positive for malaise/fatigue. Negative for chills, diaphoresis, fever and weight loss.  HENT:  Negative for nosebleeds and sore throat.   Eyes:  Negative for double vision.  Respiratory:  Negative for cough, hemoptysis, sputum production, shortness of breath and wheezing.   Cardiovascular:  Negative for chest pain, palpitations, orthopnea and leg swelling.  Gastrointestinal:  Negative for abdominal pain, blood in stool, constipation, diarrhea, heartburn, melena, nausea and vomiting.  Genitourinary:  Negative for dysuria, frequency and urgency.  Musculoskeletal:  Positive for back pain and joint pain.  Skin: Negative.  Negative for itching and rash.  Neurological:  Negative for dizziness, tingling, focal weakness, weakness and headaches.  Endo/Heme/Allergies:  Does not bruise/bleed easily.  Psychiatric/Behavioral:  Negative for depression. The patient is not nervous/anxious and does not have insomnia.      MEDICAL HISTORY:  Past Medical History:  Diagnosis Date   Anxiety    Asthma    Breast mass    LEFT x 3 months per pt and palpated by physician   Cervical disc disease    Chronic pain syndrome    COPD (chronic obstructive pulmonary disease) (HCC)     Degenerative disc disease, lumbar    osteoarthritis   Depression    Diabetes mellitus without complication (HCC)    Fibromyalgia    Fibromyalgia    GERD (gastroesophageal reflux disease)    Glaucoma    Graves disease    Headache    migraines   Hemorrhoids    History of hiatal hernia    Hyperlipidemia    Hypertension    Hyperthyroidism    Lumbar disc disease    Obesity    Pre-diabetes    Psoriatic arthritis (HCC)    Thyroid disease     SURGICAL HISTORY: Past Surgical History:  Procedure Laterality Date   BACK SURGERY     sumbar   CARDIAC CATHETERIZATION  2006   carpel tunn Right    carpel tunnel Left    CATARACT EXTRACTION Bilateral 2022   CESAREAN SECTION     COLONOSCOPY     COLONOSCOPY WITH PROPOFOL N/A 10/20/2020   Procedure: COLONOSCOPY WITH PROPOFOL;  Surgeon: Regis Bill, MD;  Location: [facility] ENDOSCOPY;  Service: Endoscopy;  Laterality: N/A;   DILATATION & CURETTAGE/HYSTEROSCOPY WITH MYOSURE N/A 10/24/2022   Procedure: DILATATION & CURETTAGE/HYSTEROSCOPY;  Surgeon: Schermerhorn, Ihor Austin, MD;  Location: [facility] ORS;  Service: Gynecology;  Laterality: N/A;   ESOPHAGOGASTRODUODENOSCOPY (EGD) WITH PROPOFOL N/A 06/26/2017   Procedure: ESOPHAGOGASTRODUODENOSCOPY (EGD) WITH PROPOFOL;  Surgeon: Christena Deem, MD;  Location: [facility] ENDOSCOPY;  Service: Endoscopy;  Laterality: N/A;   EXCISION MORTON'S NEUROMA Left 02/05/2017   Procedure: EXCISION MORTON'S NEUROMA;  Surgeon: Gwyneth Revels, DPM;  Location: [facility];  Service: Podiatry;  Laterality: Left;  iva with local  FRACTURE SURGERY  1969   Left Elbow   HAND SURGERY Right    scar tissue removal   JOINT REPLACEMENT     right hip arthroplasty 08/25/15   KNEE ARTHROPLASTY Left 08/11/2017   Procedure: COMPUTER ASSISTED TOTAL KNEE ARTHROPLASTY;  Surgeon: Donato Heinz, MD;  Location: [facility] ORS;  Service: Orthopedics;  Laterality: Left;   KNEE ARTHROPLASTY Right 02/05/2021   Procedure: COMPUTER ASSISTED  TOTAL KNEE ARTHROPLASTY;  Surgeon: Donato Heinz, MD;  Location: [facility] ORS;  Service: Orthopedics;  Laterality: Right;   KNEE ARTHROSCOPY Right 07/02/2018   Procedure: ARTHROSCOPY KNEE, Medial and Lateral  Chondroplasty;  Surgeon: Donato Heinz, MD;  Location: [facility] OR[facility];  Service: Orthopedics;  Laterality: Right;   LUMBAR LAMINECTOMY/DECOMPRESSION MICRODISCECTOMY N/A 06/19/2022   Procedure: EXPLORE LUMBAR WOUND FOR EVACUATION OF HEMATOMA;  Surgeon: Tressie Stalker, MD;  Location: [facility] OR;  Service: Neurosurgery;  Laterality: N/A;   NECK SURGERY     "disk implant"   REVERSE SHOULDER ARTHROPLASTY Left 06/24/2019   Procedure: REVERSE SHOULDER ARTHROPLASTY;  Surgeon: Christena Flake, MD;  Location: [facility] ORS;  Service: Orthopedics;  Laterality: Left;   SHOULDER SURGERY Right    spur removal   SPINAL CORD STIMULATOR REMOVAL N/A 04/09/2021   Procedure: REMOVAL SPINAL CORD STIMULATOR & PULSE GENERATOR;  Surgeon: Lucy Chris, MD;  Location: [facility] ORS;  Service: Neurosurgery;  Laterality: N/A;  2nd case   THORACIC LAMINECTOMY FOR SPINAL CORD STIMULATOR N/A 08/07/2020   Procedure: THORACIC SPINAL CORD STIMULATOR VIA LAMINECTOMY, PULSE GENERATOR;  Surgeon: Lucy Chris, MD;  Location: [facility];  Service: Neurosurgery;  Laterality: N/A;   TOTAL HIP ARTHROPLASTY Right    TUBAL LIGATION      SOCIAL HISTORY: Social History   Socioeconomic History   Marital status: Single    Spouse name: Not on file   Number of children: Not on file   Years of education: Not on file   Highest education level: Not on file  Occupational History   Not on file  Tobacco Use   Smoking status: Former    Types: Cigarettes    Quit date: 37    Years since quitting: 30.5   Smokeless tobacco: Never  Vaping Use   Vaping Use: Never used  Substance and Sexual Activity   Alcohol use: No    Alcohol/week: 0.0 standard drinks of alcohol   Drug use: Not Currently    Types: "Crack" cocaine, Marijuana    Comment: 1991   Sexual  activity: Not Currently    Birth control/protection: None  Other Topics Concern   Not on file  Social History Narrative   Live in assisted living   Social Determinants of Health   Financial Resource Strain: Not on file  Food Insecurity: Food Insecurity Present (02/05/2023)   Hunger Vital Sign    Worried About Running Out of Food in the Last Year: Sometimes true    Ran Out of Food in the Last Year: Sometimes true  Transportation Needs: No Transportation Needs (02/05/2023)   PRAPARE - Administrator, Civil Service (Medical): No    Lack of Transportation (Non-Medical): No  Physical Activity: Not on file  Stress: Not on file  Social Connections: Not on file  Intimate Partner Violence: Not At Risk (02/05/2023)   Humiliation, Afraid, Rape, and Kick questionnaire    Fear of Current or Ex-Partner: No    Emotionally Abused: No    Physically Abused: No    Sexually Abused: No  FAMILY HISTORY: Family History  Problem Relation Age of Onset   Cancer Mother    Heart disease Father    Breast cancer Neg Hx     ALLERGIES:  is allergic to cephalexin, ibuprofen, potassium chloride, shellfish allergy, and aspirin.  MEDICATIONS:  Current Outpatient Medications  Medication Sig Dispense Refill   acetaminophen (TYLENOL) 325 MG tablet Take 650 mg by mouth every 4 (four) hours as needed for mild pain, fever or headache.     albuterol (VENTOLIN HFA) 108 (90 Base) MCG/ACT inhaler Inhale 1 puff into the lungs every 4 (four) hours as needed for shortness of breath or wheezing.     amLODipine (NORVASC) 5 MG tablet Take 5 mg by mouth daily.     atorvastatin (LIPITOR) 10 MG tablet Take 10 mg by mouth daily after supper.      budesonide-formoterol (SYMBICORT) 160-4.5 MCG/ACT inhaler Inhale 2 puffs into the lungs 2 (two) times daily.     calcium carbonate (OS-CAL) 600 MG TABS tablet Take 600 mg by mouth every other day.     celecoxib (CELEBREX) 200 MG capsule Take 200 mg by mouth 2 (two) times  daily.     Cholecalciferol (VITAMIN D3 SUPER STRENGTH) 50 MCG (2000 UT) TABS Take 3,000 Units by mouth daily.     clonazePAM (KLONOPIN) 0.5 MG tablet Take 0.5 mg by mouth at bedtime as needed for anxiety.     DULoxetine (CYMBALTA) 30 MG capsule Take 30 mg by mouth at bedtime.     DULoxetine (CYMBALTA) 60 MG capsule Take 60 mg by mouth daily.     esomeprazole (NEXIUM) 40 MG capsule Take 40 mg by mouth 2 (two) times daily.      fluticasone (FLONASE) 50 MCG/ACT nasal spray Place 2 sprays into both nostrils daily.     gabapentin (NEURONTIN) 600 MG tablet Take 300 mg by mouth in the morning, at noon, and at bedtime.     HYDROcodone-acetaminophen (NORCO/VICODIN) 5-325 MG tablet Take 1 tablet by mouth every 6 (six) hours as needed for moderate pain.     ibandronate (BONIVA) 150 MG tablet Take 150 mg by mouth every 30 (thirty) days.     levocetirizine (XYZAL) 5 MG tablet Take 5 mg by mouth every evening.     linaclotide (LINZESS) 290 MCG CAPS capsule Take 290 mcg by mouth daily.     losartan (COZAAR) 100 MG tablet Take 100 mg by mouth daily.     methimazole (TAPAZOLE) 5 MG tablet Take 5 mg by mouth daily.     methocarbamol 1000 MG TABS Take 1,000 mg by mouth 3 (three) times daily. (Patient taking differently: Take 1,000 mg by mouth in the morning and at bedtime.)     MOUNJARO 5 MG/0.5ML Pen Inject 5 mg into the skin once a week.     Multiple Vitamin (MULTIVITAMIN WITH MINERALS) TABS tablet Take 1 tablet by mouth daily.     Naloxone HCl (NARCAN NA) Place 4 mg into the nose 3 (three) times daily as needed.     potassium chloride (KLOR-CON) 10 MEQ tablet Take 10 mEq by mouth daily.     pregabalin (LYRICA) 50 MG capsule Take 50 mg by mouth 2 (two) times daily.     propranolol (INDERAL) 80 MG tablet Take 80 mg by mouth 2 (two) times daily.     sucralfate (CARAFATE) 1 g tablet Take 1 g by mouth 4 (four) times daily -  before meals and at bedtime.     torsemide (DEMADEX) 10  MG tablet Take 10 mg by mouth  daily.     traZODone (DESYREL) 150 MG tablet Take 75 mg by mouth at bedtime as needed for sleep.      No current facility-administered medications for this visit.      PHYSICAL EXAMINATION:  Vitals:   02/18/23 1538  BP: 138/72  Pulse: 72  Resp: 18  Temp: 97.6 F (36.4 C)  SpO2: 100%   Filed Weights   02/18/23 1538  Weight: 272 lb (123.4 kg)    Physical Exam Vitals and nursing note reviewed.  HENT:     Head: Normocephalic and atraumatic.     Mouth/Throat:     Pharynx: Oropharynx is clear.  Eyes:     Extraocular Movements: Extraocular movements intact.     Pupils: Pupils are equal, round, and reactive to light.  Cardiovascular:     Rate and Rhythm: Normal rate and regular rhythm.  Pulmonary:     Comments: Decreased breath sounds bilaterally.  Abdominal:     Palpations: Abdomen is soft.  Musculoskeletal:        General: Normal range of motion.     Cervical back: Normal range of motion.  Skin:    General: Skin is warm.  Neurological:     General: No focal deficit present.     Mental Status: She is alert and oriented to person, place, and time.  Psychiatric:        Behavior: Behavior normal.        Judgment: Judgment normal.      LABORATORY DATA:  I have reviewed the data as listed Lab Results  Component Value Date   WBC 2.7 (L) 02/05/2023   HGB 13.7 02/05/2023   HCT 42.1 02/05/2023   MCV 83.4 02/05/2023   PLT 211 02/05/2023   Recent Labs    09/19/22 1728 10/22/22 1310 02/05/23 1447  NA 133* 131* 135  K 3.8 3.3* 4.0  CL 100 95* 100  CO2 24 24 26   GLUCOSE 140* 155* 117*  BUN 9 10 7*  CREATININE 0.71 0.78 0.67  CALCIUM 9.6 9.1 10.2  GFRNONAA >60 >60 >60  PROT 7.8  --  8.3*  ALBUMIN 4.7  --  5.1*  AST 21  --  23  ALT 20  --  20  ALKPHOS 97  --  89  BILITOT 0.7  --  0.5    RADIOGRAPHIC STUDIES: I have personally reviewed the radiological images as listed and agreed with the findings in the report. US Abdomen Complete  Result Date:  02/13/2023 CLINICAL DATA:  Cirrhosis.  Splenomegaly. EXAM: ABDOMEN ULTRASOUND COMPLETE COMPARISON:  CT lumbar spine 02/04/2023 FINDINGS: Gallbladder: No gallstones or wall thickening visualized. No sonographic Murphy sign noted by sonographer. Common bile duct: Diameter: 6 mm, upper limits of normal. Liver: Increased echogenicity. No focal lesion. Portal vein is patent on color Doppler imaging with normal direction of blood flow towards the liver. IVC: No abnormality visualized. Pancreas: Visualized portion unremarkable. Spleen: Size and appearance within normal limits. Right Kidney: Length: 12.2 cm. Echogenicity within normal limits. No mass or hydronephrosis visualized. Left Kidney: Length: 10.1 cm. Echogenicity within normal limits. No mass or hydronephrosis visualized. Abdominal aorta: No aneurysm visualized. Other findings: None. IMPRESSION: 1. Increased hepatic parenchymal echogenicity suggestive of steatosis. 2. No cholelithiasis or sonographic evidence for acute cholecystitis. Electronically Signed   By: Annia Belt M.D.   On: 02/13/2023 13:13   DG PAIN CLINIC C-ARM 1-60 MIN NO REPORT  Result Date: 02/05/2023 Fluoro was  used, but no Radiologist interpretation will be provided. Please refer to "NOTES" tab for provider progress note.  CT LUMBAR SPINE W CONTRAST  Result Date: 02/04/2023 CLINICAL DATA:  Lumbar adjacent segment disease with spondylolisthesis. Prior lumbar fusion. Low back and bilateral leg pain after a fall. EXAM: LUMBAR MYELOGRAM FLUOROSCOPY: Radiation Exposure Index (as provided by the fluoroscopic device): 21.0 mGy Kerma PROCEDURE: After thorough discussion of risks and benefits of the procedure including bleeding, infection, injury to nerves, blood vessels, adjacent structures as well as headache and CSF leak, written and oral informed consent was obtained. Consent was obtained by Dr. Sebastian Ache. Time out form was completed. Patient was positioned prone on the fluoroscopy table.  Local anesthesia was provided with 1% lidocaine without epinephrine after prepped and draped in the usual sterile fashion. Puncture was performed at L4-5 using a 5 inch 22-gauge spinal needle via a midline approach. Using a single pass through the dura, the needle was placed within the thecal sac, with return of clear CSF. 15 mL of Isovue M-200 was injected into the thecal sac, with normal opacification of the nerve roots and cauda equina consistent with free flow within the subarachnoid space. I personally performed the lumbar puncture and administered the intrathecal contrast. I also personally supervised acquisition of the myelogram images. TECHNIQUE: Contiguous axial images were obtained through the Lumbar spine after the intrathecal infusion of contrast. Coronal and sagittal reconstructions were obtained of the axial image sets. COMPARISON:  Lumbar spine MRI 11/21/2022 FINDINGS: LUMBAR MYELOGRAM FINDINGS: There are 5 non rib-bearing lumbar type vertebrae. Trace anterolisthesis of L4 on L5 minimally increases with flexion and reduces with extension. L3-4 fusion is noted with the spinal canal appearing patent at this level. A moderate-sized ventral extradural defect at L2-3 slightly enlarges with standing and likely results in at least mild spinal stenosis with underfilling of the L3 nerve roots. A ventral extradural defect is also noted at T12-L1 with mild spinal stenosis. CT LUMBAR MYELOGRAM FINDINGS: Vertebral alignment is near anatomic on this supine CT. No acute fracture or suspicious osseous lesion is identified. L3-4 posterior and interbody fusion is again noted with evidence of some bridging bone across the posterior aspect of the disc space. There is no evidence of screw loosening. The conus medullaris terminates at L1-2. There is abdominal aortic atherosclerosis without aneurysm. T9-10: Moderate disc space narrowing with vacuum disc phenomena. Disc bulging and mild facet arthrosis without stenosis.  T10-11: Mild disc space narrowing with vacuum disc. Disc bulging and mild facet arthrosis result in borderline to mild spinal stenosis and mild right neural foraminal stenosis, similar to the prior MRI. T11-12: Mild disc space narrowing with vacuum disc. Disc bulging, a right foraminal to extraforaminal disc protrusion, ligamentum flavum hypertrophy, and severe facet arthrosis result in mild-to-moderate spinal stenosis and severe right neural foraminal stenosis, similar to the prior MRI. T12-L1: Mild disc space narrowing. Disc bulging, a right foraminal to extraforaminal disc protrusion, and mild-to-moderate facet and ligamentum flavum hypertrophy result in borderline to mild spinal stenosis and moderate to severe right neural foraminal stenosis, stable to slightly progressed. L1-2: Normal disc. Moderate facet and ligamentum flavum hypertrophy without stenosis. L2-3: Mild disc space narrowing. Right laminectomy. Disc bulging and moderate facet hypertrophy result in mild bilateral lateral recess stenosis and moderate to severe bilateral neural foraminal stenosis, stable to mildly progressed. L3-4: Wide posterior decompression and fusion. Patent spinal canal and left neural foramen. Likely mild residual right neural foraminal narrowing. L4-5: Posterior decompression. Disc bulging and moderate facet  arthrosis result in moderate bilateral neural foraminal stenosis without significant residual spinal stenosis, unchanged. L5-S1: Disc bulging, congenitally short pedicles, and moderate facet and ligamentum flavum hypertrophy result in moderate spinal stenosis, more prominent than what was shown on the prior MRI. Mild-to-moderate bilateral neural foraminal stenosis. IMPRESSION: 1. L3-4 fusion without residual spinal stenosis. Moderate bilateral neural foraminal stenosis. 2. Moderate spinal stenosis and mild-to-moderate neural foraminal stenosis at L5-S1. 3. Mild spinal stenosis at L2-3 with standing. Moderate to severe  bilateral neural foraminal stenosis. 4. Mild-to-moderate spinal stenosis and severe right neural foraminal stenosis at T11-12. 5. Mild spinal stenosis and moderate to severe right neural foraminal stenosis at T12-L1. 6.  Aortic Atherosclerosis (ICD10-I70.0). Electronically Signed   By: Sebastian Ache M.D.   On: 02/04/2023 16:05   DG MYELOGRAPHY LUMBAR INJ LUMBOSACRAL  Result Date: 02/04/2023 CLINICAL DATA:  Lumbar adjacent segment disease with spondylolisthesis. Prior lumbar fusion. Low back and bilateral leg pain after a fall. EXAM: LUMBAR MYELOGRAM FLUOROSCOPY: Radiation Exposure Index (as provided by the fluoroscopic device): 21.0 mGy Kerma PROCEDURE: After thorough discussion of risks and benefits of the procedure including bleeding, infection, injury to nerves, blood vessels, adjacent structures as well as headache and CSF leak, written and oral informed consent was obtained. Consent was obtained by Dr. Sebastian Ache. Time out form was completed. Patient was positioned prone on the fluoroscopy table. Local anesthesia was provided with 1% lidocaine without epinephrine after prepped and draped in the usual sterile fashion. Puncture was performed at L4-5 using a 5 inch 22-gauge spinal needle via a midline approach. Using a single pass through the dura, the needle was placed within the thecal sac, with return of clear CSF. 15 mL of Isovue M-200 was injected into the thecal sac, with normal opacification of the nerve roots and cauda equina consistent with free flow within the subarachnoid space. I personally performed the lumbar puncture and administered the intrathecal contrast. I also personally supervised acquisition of the myelogram images. TECHNIQUE: Contiguous axial images were obtained through the Lumbar spine after the intrathecal infusion of contrast. Coronal and sagittal reconstructions were obtained of the axial image sets. COMPARISON:  Lumbar spine MRI 11/21/2022 FINDINGS: LUMBAR MYELOGRAM FINDINGS:  There are 5 non rib-bearing lumbar type vertebrae. Trace anterolisthesis of L4 on L5 minimally increases with flexion and reduces with extension. L3-4 fusion is noted with the spinal canal appearing patent at this level. A moderate-sized ventral extradural defect at L2-3 slightly enlarges with standing and likely results in at least mild spinal stenosis with underfilling of the L3 nerve roots. A ventral extradural defect is also noted at T12-L1 with mild spinal stenosis. CT LUMBAR MYELOGRAM FINDINGS: Vertebral alignment is near anatomic on this supine CT. No acute fracture or suspicious osseous lesion is identified. L3-4 posterior and interbody fusion is again noted with evidence of some bridging bone across the posterior aspect of the disc space. There is no evidence of screw loosening. The conus medullaris terminates at L1-2. There is abdominal aortic atherosclerosis without aneurysm. T9-10: Moderate disc space narrowing with vacuum disc phenomena. Disc bulging and mild facet arthrosis without stenosis. T10-11: Mild disc space narrowing with vacuum disc. Disc bulging and mild facet arthrosis result in borderline to mild spinal stenosis and mild right neural foraminal stenosis, similar to the prior MRI. T11-12: Mild disc space narrowing with vacuum disc. Disc bulging, a right foraminal to extraforaminal disc protrusion, ligamentum flavum hypertrophy, and severe facet arthrosis result in mild-to-moderate spinal stenosis and severe right neural foraminal stenosis, similar  to the prior MRI. T12-L1: Mild disc space narrowing. Disc bulging, a right foraminal to extraforaminal disc protrusion, and mild-to-moderate facet and ligamentum flavum hypertrophy result in borderline to mild spinal stenosis and moderate to severe right neural foraminal stenosis, stable to slightly progressed. L1-2: Normal disc. Moderate facet and ligamentum flavum hypertrophy without stenosis. L2-3: Mild disc space narrowing. Right laminectomy.  Disc bulging and moderate facet hypertrophy result in mild bilateral lateral recess stenosis and moderate to severe bilateral neural foraminal stenosis, stable to mildly progressed. L3-4: Wide posterior decompression and fusion. Patent spinal canal and left neural foramen. Likely mild residual right neural foraminal narrowing. L4-5: Posterior decompression. Disc bulging and moderate facet arthrosis result in moderate bilateral neural foraminal stenosis without significant residual spinal stenosis, unchanged. L5-S1: Disc bulging, congenitally short pedicles, and moderate facet and ligamentum flavum hypertrophy result in moderate spinal stenosis, more prominent than what was shown on the prior MRI. Mild-to-moderate bilateral neural foraminal stenosis. IMPRESSION: 1. L3-4 fusion without residual spinal stenosis. Moderate bilateral neural foraminal stenosis. 2. Moderate spinal stenosis and mild-to-moderate neural foraminal stenosis at L5-S1. 3. Mild spinal stenosis at L2-3 with standing. Moderate to severe bilateral neural foraminal stenosis. 4. Mild-to-moderate spinal stenosis and severe right neural foraminal stenosis at T11-12. 5. Mild spinal stenosis and moderate to severe right neural foraminal stenosis at T12-L1. 6.  Aortic Atherosclerosis (ICD10-I70.0). Electronically Signed   By: Sebastian Ache M.D.   On: 02/04/2023 16:05    ASSESSMENT & PLAN:   Other neutropenia (HCC) #Leukopenia- neutropenia/mild intermittent anemia [MAY 2024- [facility]- 0.94] normal /platelets.  June 2024 white count 2.4 ANC of 2; lymphopenia 0.5. negative for LDH;B12; folic acid; HIV, Hep C; Hep B.  June 2024- US Abdomen- Increased hepatic parenchymal echogenicity suggestive of steatosis. No splenomegaly.   # Patient is asymptomatic-benign ethnic neutropenia/lympopenia [most likely benign]. No clinical evidence of hepatosplenomegaly or liver disease.  As patient is asymptomatic I think is reasonable to hold off further workup like bone  marrow biopsy for now.  Again will reevaluate again in 6 months.  # June 2024- US Abdomen- Increased hepatic parenchymal echogenicity suggestive of steatosis/fattty liver; discussed re: risk of cirrhosis.  on Monjauro/ defer to PCP. Discussed re: weight loss.   # Psoriatic arthrtitis/ fibromyalgia- [on Cimzia q 50m]- Unlikely cause of her leucopenia; monitor for now.   # DISPOSITION: # follow up in 6 month  2024- MD;labs-cbc/cmp -Dr.B  All questions were answered. The patient knows to call the clinic with any problems, questions or concerns.    Earna Coder, MD 02/18/2023 4:08 PM

## 2023-02-18 NOTE — Assessment & Plan Note (Signed)
blood pressure control important in reducing the progression of atherosclerotic disease. On appropriate oral medications.  

## 2023-02-18 NOTE — Assessment & Plan Note (Signed)
blood glucose control important in reducing the progression of atherosclerotic disease. Also, involved in wound healing. On appropriate medications.  

## 2023-02-18 NOTE — Progress Notes (Signed)
Pt here for F/U neutropenia. No fevers. No signs of any infections. Pt ambulatory with walker. Appetite is good. Energy is fair.

## 2023-02-20 ENCOUNTER — Encounter
Admission: RE | Admit: 2023-02-20 | Discharge: 2023-02-20 | Disposition: A | Discharge status: Home / Self Care | Payer: Medicare HMO | Source: Ambulatory Visit | Attending: Physician Assistant | Admitting: Physician Assistant

## 2023-02-20 DIAGNOSIS — Z96653 Presence of artificial knee joint, bilateral: Secondary | ICD-10-CM | POA: Diagnosis not present

## 2023-02-20 DIAGNOSIS — E119 Type 2 diabetes mellitus without complications: Secondary | ICD-10-CM | POA: Diagnosis not present

## 2023-02-20 DIAGNOSIS — M7989 Other specified soft tissue disorders: Secondary | ICD-10-CM | POA: Diagnosis not present

## 2023-02-20 DIAGNOSIS — M25561 Pain in right knee: Secondary | ICD-10-CM | POA: Diagnosis not present

## 2023-02-20 DIAGNOSIS — Z96651 Presence of right artificial knee joint: Secondary | ICD-10-CM | POA: Insufficient documentation

## 2023-02-20 MED ORDER — TECHNETIUM TC 99M MEDRONATE IV KIT
20.0000 | PACK | Freq: Once | INTRAVENOUS | Status: AC | PRN
  Administered 2023-02-20: 22.2 via INTRAVENOUS

## 2023-03-11 DIAGNOSIS — M47816 Spondylosis without myelopathy or radiculopathy, lumbar region: Secondary | ICD-10-CM | POA: Diagnosis not present

## 2023-03-12 ENCOUNTER — Emergency Department: Payer: Medicare HMO

## 2023-03-12 ENCOUNTER — Encounter: Payer: Self-pay | Admitting: *Deleted

## 2023-03-12 ENCOUNTER — Other Ambulatory Visit: Payer: Self-pay

## 2023-03-12 ENCOUNTER — Emergency Department
Admission: EM | Admit: 2023-03-12 | Discharge: 2023-03-12 | Disposition: A | Discharge status: Home / Self Care | Payer: Medicare HMO | Attending: Emergency Medicine | Admitting: Emergency Medicine

## 2023-03-12 DIAGNOSIS — R531 Weakness: Secondary | ICD-10-CM | POA: Diagnosis not present

## 2023-03-12 DIAGNOSIS — I10 Essential (primary) hypertension: Secondary | ICD-10-CM | POA: Diagnosis not present

## 2023-03-12 DIAGNOSIS — W19XXXA Unspecified fall, initial encounter: Secondary | ICD-10-CM | POA: Insufficient documentation

## 2023-03-12 DIAGNOSIS — M25561 Pain in right knee: Secondary | ICD-10-CM | POA: Diagnosis not present

## 2023-03-12 DIAGNOSIS — Z96651 Presence of right artificial knee joint: Secondary | ICD-10-CM | POA: Diagnosis not present

## 2023-03-12 DIAGNOSIS — Z743 Need for continuous supervision: Secondary | ICD-10-CM | POA: Diagnosis not present

## 2023-03-12 MED ORDER — ONDANSETRON 4 MG PO TBDP
4.0000 mg | ORAL_TABLET | Freq: Once | ORAL | Status: AC
  Administered 2023-03-12: 4 mg via ORAL
  Filled 2023-03-12 (×2): qty 1

## 2023-03-12 MED ORDER — OXYCODONE-ACETAMINOPHEN 5-325 MG PO TABS
1.0000 | ORAL_TABLET | Freq: Once | ORAL | Status: AC
  Administered 2023-03-12: 1 via ORAL
  Filled 2023-03-12: qty 1

## 2023-03-12 NOTE — ED Notes (Signed)
Attempted to call the [facility] [place] and gave report to dyshonti

## 2023-03-12 NOTE — ED Provider Notes (Signed)
[facility] Provider Note  Patient Contact: 10:29 PM (approximate)   History   Knee Pain and Fall   HPI  Christine Higgins is a 67 y.o. female with a history of right total knee arthroplasty 2 years ago, presents to the emergency department with acute on chronic right knee pain.  Patient reports that she is followed up with her orthopedist multiple times and they have been unable to identify a source to her discomfort.  Patient describes pain as burning.  She has tried a knee brace with little relief.  No numbness or tingling in the right leg.  No other alleviating measures have been attempted.      Physical Exam   Triage Vital Signs: ED Triage Vitals  Encounter Vitals Group     BP 03/12/23 2228 (!) 140/72     Systolic BP Percentile --      Diastolic BP Percentile --      Pulse Rate 03/12/23 2228 78     Resp 03/12/23 2228 20     Temp 03/12/23 2228 99.3 F (37.4 C)     Temp Source 03/12/23 2228 Oral     SpO2 03/12/23 2228 97 %     Weight 03/12/23 2224 271 lb 2.7 oz (123 kg)     Height 03/12/23 2224 5\' 11"  (1.803 m)     Head Circumference --      Peak Flow --      Pain Score 03/12/23 2229 8     Pain Loc --      Pain Education --      Exclude from Growth Chart --     Most recent vital signs: Vitals:   03/12/23 2228  BP: (!) 140/72  Pulse: 78  Resp: 20  Temp: 99.3 F (37.4 C)  SpO2: 97%     General: Alert and in no acute distress. Eyes:  PERRL. EOMI. Head: No acute traumatic findings ENT:      Nose: No congestion/rhinnorhea.      Mouth/Throat: Mucous membranes are moist. Neck: No stridor. No cervical spine tenderness to palpation. Cardiovascular:  Good peripheral perfusion Respiratory: Normal respiratory effort without tachypnea or retractions. Lungs CTAB. Good air entry to the bases with no decreased or absent breath sounds. Gastrointestinal: Bowel sounds 4 quadrants. Soft and nontender to palpation. No guarding or rigidity. No  palpable masses. No distention. No CVA tenderness. Musculoskeletal: Full range of motion to all extremities.  Neurologic:  No gross focal neurologic deficits are appreciated.  Skin:   No rash noted Other:   ED Results / Procedures / Treatments   Labs (all labs ordered are listed, but only abnormal results are displayed) Labs Reviewed - No data to display      RADIOLOGY  I personally viewed and evaluated these images as part of my medical decision making, as well as reviewing the written report by the radiologist.  ED Provider Interpretation: Right knee total arthroplasty with no other acute abnormality.   PROCEDURES:  Critical Care performed: No  Procedures   MEDICATIONS ORDERED IN ED: Medications  oxyCODONE-acetaminophen (PERCOCET/ROXICET) 5-325 MG per tablet 1 tablet (1 tablet Oral Given 03/12/23 2308)  ondansetron (ZOFRAN-ODT) disintegrating tablet 4 mg (4 mg Oral Given 03/12/23 2308)     IMPRESSION / MDM / ASSESSMENT AND PLAN / ED COURSE  I reviewed the triage vital signs and the nursing notes.  Assessment and plan: Right knee pain:  67 year old female presents to the emergency department with acute on chronic right knee pain.  X-ray of the right knee shows no acute abnormality.  Patient was given a Percocet in the emergency department for pain and advised to follow-up with orthopedics.     FINAL CLINICAL IMPRESSION(S) / ED DIAGNOSES   Final diagnoses:  Acute pain of right knee     Rx / DC Orders   ED Discharge Orders     None        Note:  This document was prepared using Dragon voice recognition software and may include unintentional dictation errors.   Pia Mau Laguna Rosabel Sermeno, Cordelia Poche 03/12/23 2309    Minna Antis, MD 03/13/23 [phone]

## 2023-03-12 NOTE — ED Triage Notes (Addendum)
Pt brought in via ems from the [facility].  Pt has right knee pain.  Pt fell yesterday.   Pt alert.  Pt wearing a knee brace on right knee.    Pt had knee replacement 2 years ago.

## 2023-03-12 NOTE — ED Notes (Signed)
called to [facility] for pt transport to facility/[facility] [place]/rep:madelyn.

## 2023-03-18 ENCOUNTER — Telehealth: Payer: Self-pay | Admitting: Student in an Organized Health Care Education/Training Program

## 2023-03-18 ENCOUNTER — Encounter: Payer: Self-pay | Admitting: Student in an Organized Health Care Education/Training Program

## 2023-03-18 ENCOUNTER — Ambulatory Visit
Payer: Medicare HMO | Attending: Student in an Organized Health Care Education/Training Program | Admitting: Student in an Organized Health Care Education/Training Program

## 2023-03-18 ENCOUNTER — Telehealth: Payer: Self-pay

## 2023-03-18 DIAGNOSIS — Z981 Arthrodesis status: Secondary | ICD-10-CM | POA: Diagnosis not present

## 2023-03-18 DIAGNOSIS — G894 Chronic pain syndrome: Secondary | ICD-10-CM | POA: Diagnosis not present

## 2023-03-18 DIAGNOSIS — R7303 Prediabetes: Secondary | ICD-10-CM | POA: Diagnosis not present

## 2023-03-18 DIAGNOSIS — I10 Essential (primary) hypertension: Secondary | ICD-10-CM | POA: Diagnosis not present

## 2023-03-18 DIAGNOSIS — E0500 Thyrotoxicosis with diffuse goiter without thyrotoxic crisis or storm: Secondary | ICD-10-CM | POA: Diagnosis not present

## 2023-03-18 DIAGNOSIS — Z96651 Presence of right artificial knee joint: Secondary | ICD-10-CM | POA: Diagnosis not present

## 2023-03-18 DIAGNOSIS — M5416 Radiculopathy, lumbar region: Secondary | ICD-10-CM | POA: Diagnosis not present

## 2023-03-18 DIAGNOSIS — M5412 Radiculopathy, cervical region: Secondary | ICD-10-CM | POA: Diagnosis not present

## 2023-03-18 NOTE — Telephone Encounter (Signed)
Attempted to call patient .  No answer.  Left messge to call us back.

## 2023-03-18 NOTE — Progress Notes (Signed)
Hey Ms. Leah how you doing well thank patient: Christine Higgins  Service Category: E/M  Provider: Edward Jolly, MD  DOB: 1955-11-08  DOS: 03/18/2023  Location: Office  MRN: [id number]  Setting: Ambulatory outpatient  Referring Provider: Lauro Regulus, MD  Type: Established Patient  Specialty: Interventional Pain Management  PCP: Lauro Regulus, MD  Location: Remote location  Delivery: TeleHealth     Virtual Encounter - Pain Management PROVIDER NOTE: Information contained herein reflects review and annotations entered in association with encounter. Interpretation of such information and data should be left to medically-trained personnel. Information provided to patient can be located elsewhere in the medical record under "Patient Instructions". Document created using STT-dictation technology, any transcriptional errors that may result from process are unintentional.    Contact & Pharmacy Preferred: [phone] Home: [phone] (home) Mobile: [phone] (mobile) E-mail: [email or web address]  [id number] [place], [place] - [address] [place] [address] Phone: [phone] Fax: [phone]  [facility] [address] [place] [address] Phone: [phone] Fax: [phone]   Pre-screening  Ms. Sweatt offered "in-person" vs "virtual" encounter. She indicated preferring virtual for this encounter.   Reason COVID-19*  Social distancing based on CDC and AMA recommendations.   I contacted Christine Higgins on 03/18/2023 via telephone.      I clearly identified myself as Edward Jolly, MD. I verified that I was speaking with the correct person using two identifiers (Name: Christine Higgins, and date of birth: [birth year]).  Consent I sought verbal advanced consent from Christine Higgins for virtual visit interactions. I informed Ms. Lastra of possible security and privacy concerns, risks, and limitations associated with providing "not-in-person"  medical evaluation and management services. I also informed Ms. Sarchet of the availability of "in-person" appointments. Finally, I informed her that there would be a charge for the virtual visit and that she could be  personally, fully or partially, financially responsible for it. Ms. Bloomfield expressed understanding and agreed to proceed.   Historic Elements   [name] is a 67 y.o. year old, female patient evaluated today after our last contact on 03/18/2023. [name]  has a past medical history of Anxiety, Asthma, Breast mass, Cervical disc disease, Chronic pain syndrome, COPD (chronic obstructive pulmonary disease) (HCC), Degenerative disc disease, lumbar, Depression, Diabetes mellitus without complication (HCC), Fibromyalgia, Fibromyalgia, GERD (gastroesophageal reflux disease), Glaucoma, Graves disease, Headache, Hemorrhoids, History of hiatal hernia, Hyperlipidemia, Hypertension, Hyperthyroidism, Lumbar disc disease, Obesity, Pre-diabetes, Psoriatic arthritis (HCC), and Thyroid disease. She also  has a past surgical history that includes carpel tunn (Right); Hand surgery (Right); carpel tunnel (Left); Cesarean section; Shoulder surgery (Right); Back surgery; Neck surgery; Total hip arthroplasty (Right); Excision Morton's neuroma (Left, 02/05/2017); Colonoscopy; Joint replacement; Esophagogastroduodenoscopy (egd) with propofol (N/A, 06/26/2017); Knee Arthroplasty (Left, 08/11/2017); Knee arthroscopy (Right, 07/02/2018); Reverse shoulder arthroplasty (Left, 06/24/2019); Thoracic laminectomy for spinal cord stimulator (N/A, 08/07/2020); Colonoscopy with propofol (N/A, 10/20/2020); Cardiac catheterization (2006); Tubal ligation; Knee Arthroplasty (Right, 02/05/2021); Spinal cord stimulator removal (N/A, 04/09/2021); Cataract extraction (Bilateral, 2022); Fracture surgery (1969); Lumbar laminectomy/decompression microdiscectomy (N/A, 06/19/2022); and Dilatation & curettage/hysteroscopy with myosure (N/A,  10/24/2022). Ms. Gunnerson has a current medication list which includes the following prescription(s): acetaminophen, albuterol, amlodipine, atorvastatin, budesonide-formoterol, calcium carbonate, celecoxib, vitamin d3 super strength, clonazepam, duloxetine, duloxetine, esomeprazole, fluticasone, gabapentin, hydrocodone-acetaminophen, ibandronate, levocetirizine, linaclotide, losartan, methimazole, methocarbamol, mounjaro, multivitamin with minerals, naloxone hcl, potassium chloride, pregabalin, propranolol, sucralfate, torsemide, and trazodone. She  reports that she quit smoking about  30 years ago. Her smoking use included cigarettes. She has never used smokeless tobacco. She reports that she does not currently use drugs after having used the following drugs: "Crack" cocaine and Marijuana. She reports that she does not drink alcohol. Ms. Sonnenfeld is allergic to cephalexin, ibuprofen, potassium chloride, shellfish allergy, and aspirin.  BMI: Estimated body mass index is 37.82 kg/m as calculated from the following:   Height as of 03/12/23: 5\' 11"  (1.803 m).   Weight as of 03/12/23: 271 lb 2.7 oz (123 kg). Last encounter: 10/08/2022. Last procedure: 02/05/2023.  HPI  Today, she is being contacted for a post-procedure assessment.   Post-procedure evaluation   Procedure: Cervical Epidural Steroid injection (CESI) (Interlaminar) #1  Laterality: Left  Level: C7-T1 Imaging: Fluoroscopy-assisted DOS: 02/05/2023  Performed by: Edward Jolly, MD Anesthesia: Local anesthesia (1-2% Lidocaine)   Purpose: Diagnostic/Therapeutic Indications: Cervicalgia, cervical radicular pain, degenerative disc disease, severe enough to impact quality of life or function. 1. Cervical radicular pain (R>L)   2. S/P cervical spinal fusion   3. Foraminal stenosis of cervical region    NAS-11 score:   Pre-procedure: 8 /10   Post-procedure: 4 /10      Effectiveness:  Initial hour after procedure: 100 %  Subsequent 4-6 hours  post-procedure: 70 %  Analgesia past initial 6 hours: 70% for 1 month Ongoing improvement:  Analgesic:  70% for 1 month now pain is coming back Function: Somewhat improved ROM: Somewhat improved   Pharmacotherapy Assessment   Opioid Analgesic: Tramadol 100 mg twice daily, quantity 120/month MME equals 20   Monitoring: [place] PMP: PDMP reviewed during this encounter.       Pharmacotherapy: No side-effects or adverse reactions reported. Compliance: No problems identified. Effectiveness: Clinically acceptable. Plan: Refer to "POC". UDS:  Summary  Date Value Ref Range Status  11/15/2021 Note  Final    Comment:    ==================================================================== [employer] Select 13 (MW) ==================================================================== Test                             Result       Flag       Units  Drug Present and Declared for Prescription Verification   7-aminoclonazepam              42           EXPECTED   ng/mg creat    7-aminoclonazepam is an expected metabolite of clonazepam. Source of    clonazepam is a scheduled prescription medication.    Tramadol                       >6944        EXPECTED   ng/mg creat   O-Desmethyltramadol            5594         EXPECTED   ng/mg creat   N-Desmethyltramadol            >6944        EXPECTED   ng/mg creat    Source of tramadol is a prescription medication. O-desmethyltramadol    and N-desmethyltramadol are expected metabolites of tramadol.  ==================================================================== Test                      Result    Flag   Units      Ref Range   Creatinine  72               mg/dL      >=82 ==================================================================== Declared Medications:  The flagging and interpretation on this report are based on the  following declared medications.  Unexpected results may arise from  inaccuracies in the declared medications.    **Note: The testing scope of this panel includes these medications:   Clonazepam (Klonopin)  Tramadol (Ultram)   **Note: The testing scope of this panel does not include the  following reported medications:   Acetaminophen (Tylenol)  Atorvastatin (Lipitor)  Budesonide (Symbicort)  Calcium  Celecoxib (Celebrex)  Ciclopirox (Loprox)  Clobetasol (Temovate)  Diphenhydramine (Benadryl)  Duloxetine (Cymbalta)  Esomeprazole (Nexium)  Fexofenadine (Allegra)  Fluticasone (Flonase)  Formoterol (Symbicort)  Gabapentin (Neurontin)  Hydralazine (Apresoline)  Hydrochlorothiazide (Hyzaar)  Ketoconazole (Nizoral)  Linaclotide (Linzess)  Losartan (Hyzaar)  Methimazole (Tapazole)  Methocarbamol (Robaxin)  Multivitamin  Probiotic  Propranolol (Inderal)  Sucralfate (Carafate)  Terbinafine (Lamisil)  Trazodone (Desyrel)  Triamcinolone (Kenalog)  Turmeric  Ubiquinone (CoQ10)  Vitamin A  Vitamin B12  Vitamin C  Vitamin D3  Vitamin E ==================================================================== For clinical consultation, please call [phone]. ====================================================================    No results found for: "CBDTHCR", "D8THCCBX", "D9THCCBX"   Laboratory Chemistry Profile   Renal Lab Results  Component Value Date   BUN 7 (L) 02/05/2023   CREATININE 0.67 02/05/2023   BCR 11 (L) 04/27/2020   GFRAA 83 04/27/2020   GFRNONAA >60 02/05/2023    Hepatic Lab Results  Component Value Date   AST 23 02/05/2023   ALT 20 02/05/2023   ALBUMIN 5.1 (H) 02/05/2023   ALKPHOS 89 02/05/2023   HCVAB NON REACTIVE 02/05/2023   LIPASE 27 12/14/2016    Electrolytes Lab Results  Component Value Date   NA 135 02/05/2023   K 4.0 02/05/2023   CL 100 02/05/2023   CALCIUM 10.2 02/05/2023   MG 1.9 08/27/2014    Bone No results found for: "VD25OH", "VD125OH2TOT", "HQ4696EX5", "MW4132GM0", "25OHVITD1", "25OHVITD2", "25OHVITD3", "TESTOFREE", "TESTOSTERONE"   Inflammation (CRP: Acute Phase) (ESR: Chronic Phase) Lab Results  Component Value Date   CRP <0.5 02/05/2023   ESRSEDRATE 6 01/25/2021         Note: Above Lab results reviewed.  Imaging  DG Knee Complete 4 Views Right CLINICAL DATA:  Right knee pain after a fall yesterday.  EXAM: RIGHT KNEE - COMPLETE 4+ VIEW  COMPARISON:  02/15/2021  FINDINGS: Postoperative right total knee arthroplasty with cemented components and patellar femoral component. Components appear well seated. No evidence of acute fracture or dislocation. No focal bone lesion or bone destruction. No significant effusions. Soft tissues are unremarkable.  IMPRESSION: Right total knee arthroplasty. Components appear well seated. No acute bony abnormalities.  Electronically Signed   By: Burman Nieves M.D.   On: 03/12/2023 22:56  Assessment  The primary encounter diagnosis was Cervical radicular pain (R>L). Diagnoses of S/P cervical spinal fusion and Chronic pain syndrome were also pertinent to this visit.  Plan of Care  Ms. Clegg had a great initial response to her cervical epidural steroid injection.  She notes reduction in pain and improvement in her cervical range of motion.  She notes approximately 70% pain reduction for the first month and gradual return of pain thereafter.  We discussed repeating a cervical epidural steroid injection in the next couple of weeks.  Risk and benefits reviewed and patient like to proceed.  Orders:  Orders Placed This Encounter  Procedures   Cervical Epidural Injection  Sedation: Patient's choice. Purpose: Diagnostic/Therapeutic Indication(s): Radiculitis and cervicalgia associater with cervical degenerative disc disease.    Standing Status:   Future    Standing Expiration Date:   06/18/2023    Scheduling Instructions:     Procedure: Cervical Epidural Steroid Injection/Block     Level(s): C7-T1     Laterality: TBD     Timeframe: As soon as schedule allows     Order Specific Question:   Where will this procedure be performed?    Answer:   [facility]    Comments:   [facility]   Follow-up plan:   Return in about 20 days (around 04/07/2023) for C-ESI .      Status post right L5-S1 ESI 03/23/2019, 6 out of 8 cc injected, #2 on 04/21/19, 5 cc more concentrated, #3 on 06/14/2019: 6 cc, #4 09/29/19 left L5/S1 IL , #5 12/08/19, #6 02/23/20. [employer] SCS trial 06/26/20 significant functional and analgesic benefit.  Patient ambulated during clinic which she has never done before.  Plan for implant with Dr Adriana Simas              Recent Visits Date Type Provider Dept  02/05/23 Procedure visit Edward Jolly, MD [facility]  Showing recent visits within past 90 days and meeting all other requirements Today's Visits Date Type Provider Dept  03/18/23 Office Visit Edward Jolly, MD [facility]  Showing today's visits and meeting all other requirements Future Appointments No visits were found meeting these conditions. Showing future appointments within next 90 days and meeting all other requirements  I discussed the assessment and treatment plan with the patient. The patient was provided an opportunity to ask questions and all were answered. The patient agreed with the plan and demonstrated an understanding of the instructions.  Patient advised to call back or seek an in-person evaluation if the symptoms or condition worsens.  Duration of encounter: .  Note by: Edward Jolly, MD Date: 03/18/2023; Time: 3:05 PM

## 2023-03-18 NOTE — Telephone Encounter (Signed)
PT left voicemail stated that she miss the nurse call on yesterday. PT asked of a nurse will give her another call this morning , patient has a vv appt schedule for today. TY

## 2023-03-18 NOTE — Telephone Encounter (Signed)
Attempted to call patient for pre virtual appointment questions.

## 2023-03-20 DIAGNOSIS — E119 Type 2 diabetes mellitus without complications: Secondary | ICD-10-CM | POA: Diagnosis not present

## 2023-03-25 ENCOUNTER — Other Ambulatory Visit: Payer: Self-pay | Admitting: Dermatology

## 2023-03-25 DIAGNOSIS — L219 Seborrheic dermatitis, unspecified: Secondary | ICD-10-CM

## 2023-03-26 ENCOUNTER — Ambulatory Visit (INDEPENDENT_AMBULATORY_CARE_PROVIDER_SITE_OTHER): Payer: Medicare HMO | Admitting: Dermatology

## 2023-03-26 ENCOUNTER — Encounter: Payer: Self-pay | Admitting: Dermatology

## 2023-03-26 VITALS — BP 140/80 | HR 80

## 2023-03-26 DIAGNOSIS — B351 Tinea unguium: Secondary | ICD-10-CM | POA: Diagnosis not present

## 2023-03-26 DIAGNOSIS — L4050 Arthropathic psoriasis, unspecified: Secondary | ICD-10-CM | POA: Diagnosis not present

## 2023-03-26 DIAGNOSIS — G8929 Other chronic pain: Secondary | ICD-10-CM | POA: Diagnosis not present

## 2023-03-26 DIAGNOSIS — L304 Erythema intertrigo: Secondary | ICD-10-CM | POA: Diagnosis not present

## 2023-03-26 DIAGNOSIS — L280 Lichen simplex chronicus: Secondary | ICD-10-CM

## 2023-03-26 DIAGNOSIS — M79644 Pain in right finger(s): Secondary | ICD-10-CM | POA: Diagnosis not present

## 2023-03-26 DIAGNOSIS — M654 Radial styloid tenosynovitis [de Quervain]: Secondary | ICD-10-CM | POA: Diagnosis not present

## 2023-03-26 DIAGNOSIS — L219 Seborrheic dermatitis, unspecified: Secondary | ICD-10-CM | POA: Diagnosis not present

## 2023-03-26 MED ORDER — CLOBETASOL PROPIONATE 0.05 % EX CREA
TOPICAL_CREAM | CUTANEOUS | 1 refills | Status: AC
Start: 2023-03-26 — End: ?

## 2023-03-26 MED ORDER — TERBINAFINE HCL 250 MG PO TABS
250.0000 mg | ORAL_TABLET | Freq: Every day | ORAL | 2 refills | Status: AC
Start: 2023-03-26 — End: ?

## 2023-03-26 MED ORDER — KETOCONAZOLE 2 % EX CREA
TOPICAL_CREAM | CUTANEOUS | 11 refills | Status: AC
Start: 2023-03-26 — End: ?

## 2023-03-26 MED ORDER — FLUOCINOLONE ACETONIDE BODY 0.01 % EX OIL
TOPICAL_OIL | CUTANEOUS | 11 refills | Status: AC
Start: 2023-03-26 — End: ?

## 2023-03-26 MED ORDER — KETOCONAZOLE 2 % EX SHAM
MEDICATED_SHAMPOO | CUTANEOUS | 11 refills | Status: AC
Start: 2023-03-26 — End: ?

## 2023-03-26 NOTE — Patient Instructions (Addendum)
Intertrigo is a chronic recurrent rash that occurs in skin fold areas that may be associated with friction; heat; moisture; yeast; fungus; and bacteria.  It is exacerbated by increased movement / activity; sweating; and higher atmospheric temperature.  Treatment Plan Start ketoconazole cream apply topically 1 to 2  times daily at night to itchy rash under breast and body folds   Recommend OTC Zeasorb AF powder to body folds daily after shower.  It is often found in the athlete's foot section in the pharmacy.  Avoid using powders that contain cornstarch.   Apply in morning Recommend OTC Zeasorb AF powder to body folds daily after shower.  It is often found in the athlete's foot section in the pharmacy.  Avoid using powders that contain cornstarch.  Stop lotrimazole cream to affected areas   For lichen simplex chronicus at top of left breast    Start clobetasol cream - apply twice daily to affected area of rash at left breast 2 - 4 weeks as needed. Avoid applying to face, groin, and axilla. Use as directed. Long-term use can cause thinning of the skin.   Topical steroids (such as triamcinolone, fluocinolone, fluocinonide, mometasone, clobetasol, halobetasol, betamethasone, hydrocortisone) can cause thinning and lightening of the skin if they are used for too long in the same area. Your physician has selected the right strength medicine for your problem and area affected on the body. Please use your medication only as directed by your physician to prevent side effects.    For Feet and toenails   Start terbinafine 250 mg tab - take 1 tab by mouth daily with food.   Start ketoconazole cream - apply topically to feet and toenails nightly     Terbinafine Counseling  Terbinafine is an anti-fungal medicine that can be applied to the skin (over the counter) or taken by mouth (prescription) to treat fungal infections. The pill version is often used to treat fungal infections of the nails or  scalp. While most people do not have any side effects from taking terbinafine pills, some possible side effects of the medicine can include taste changes, headache, loss of smell, vision changes, nausea, vomiting, or diarrhea.   Rare side effects can include irritation of the liver, allergic reaction, or decrease in blood counts (which may show up as not feeling well or developing an infection). If you are concerned about any of these side effects, please stop the medicine and call your doctor, or in the case of an emergency such as feeling very unwell, seek immediate medical care.    For Seborrheic dermatitis at scalp   Continue ketoconazole shampoo massage into scalp and let sit 10 - 15 mins before rinsing. Use 1 x weekly  Continue fluocinolone acetonide body 0.01 % oil Apply topically to scalp twice daily as needed for itchy    Due to recent changes in healthcare laws, you may see results of your pathology and/or laboratory studies on MyChart before the doctors have had a chance to review them. We understand that in some cases there may be results that are confusing or concerning to you. Please understand that not all results are received at the same time and often the doctors may need to interpret multiple results in order to provide you with the best plan of care or course of treatment. Therefore, we ask that you please give [place] 2 business days to thoroughly review all your results before contacting the office for clarification. Should we see a critical lab result, you  will be contacted sooner.   If You Need Anything After Your Visit  If you have any questions or concerns for your doctor, please call our main line at [phone] and press option 4 to reach your doctor's medical assistant. If no one answers, please leave a voicemail as directed and we will return your call as soon as possible. Messages left after 4 pm will be answered the following business day.   You may also send [place] a  message via MyChart. We typically respond to MyChart messages within 1-2 business days.  For prescription refills, please ask your pharmacy to contact our office. Our fax number is [phone].  If you have an urgent issue when the clinic is closed that cannot wait until the next business day, you can page your doctor at the number below.    Please note that while we do our best to be available for urgent issues outside of office hours, we are not available 24/7.   If you have an urgent issue and are unable to reach [place], you may choose to seek medical care at your doctor's office, retail clinic, urgent care center, or emergency room.  If you have a medical emergency, please immediately call 911 or go to the emergency department.  Pager Numbers  - Dr. Gwen Pounds: [phone]  - Dr. Neale Burly: [phone]  - Dr. Roseanne Reno: [phone]  In the event of inclement weather, please call our main line at [phone] for an update on the status of any delays or closures.  Dermatology Medication Tips: Please keep the boxes that topical medications come in in order to help keep track of the instructions about where and how to use these. Pharmacies typically print the medication instructions only on the boxes and not directly on the medication tubes.   If your medication is too expensive, please contact our office at [phone] option 4 or send [place] a message through MyChart.   We are unable to tell what your co-pay for medications will be in advance as this is different depending on your insurance coverage. However, we may be able to find a substitute medication at lower cost or fill out paperwork to get insurance to cover a needed medication.   If a prior authorization is required to get your medication covered by your insurance company, please allow [place] 1-2 business days to complete this process.  Drug prices often vary depending on where the prescription is filled and some pharmacies may offer  cheaper prices.  The website www.goodrx.com contains coupons for medications through different pharmacies. The prices here do not account for what the cost may be with help from insurance (it may be cheaper with your insurance), but the website can give you the price if you did not use any insurance.  - You can print the associated coupon and take it with your prescription to the pharmacy.  - You may also stop by our office during regular business hours and pick up a GoodRx coupon card.  - If you need your prescription sent electronically to a different pharmacy, notify our office through [facility] or by phone at [phone] option 4.     Si Usted Necesita Algo Despus de Su Visita  Tambin puede enviarnos un mensaje a travs de Clinical cytogeneticist. Por lo general respondemos a los mensajes de MyChart en el transcurso de 1 a 2 das hbiles.  Para renovar recetas, por favor pida a su farmacia que se ponga en contacto con nuestra oficina. Nuestro nmero de  fax es el [phone].  Si tiene un asunto urgente cuando la clnica est cerrada y que no puede esperar hasta el siguiente da hbil, puede llamar/localizar a su doctor(a) al nmero que aparece a continuacin.   Por favor, tenga en cuenta que aunque hacemos todo lo posible para estar disponibles para asuntos urgentes fuera del horario de [place], no estamos disponibles las 24 horas del da, los 7 [address]  [address] de la [place].   Si tiene un problema urgente y no puede comunicarse con nosotros, puede optar por buscar atencin mdica  en el consultorio de su doctor(a), en una clnica privada, en un centro de atencin urgente o en una sala de emergencias.  Si tiene Engineer, drilling, por favor llame inmediatamente al 911 o vaya a la sala de emergencias.  Nmeros de bper  - Dr. Gwen Pounds: [phone]  - Dra. Moye: [phone]  - Dra. Roseanne Reno: [phone]  En caso de inclemencias del [place], por favor llame a Lacy Duverney principal al  [phone] para una actualizacin sobre el [place] de cualquier retraso o cierre.  Consejos para la medicacin en dermatologa: Por favor, guarde las cajas en las que vienen los medicamentos de uso tpico para ayudarle a seguir las instrucciones sobre dnde y cmo usarlos. Las farmacias generalmente imprimen las instrucciones del medicamento slo en las cajas y no directamente en los tubos del [place].   Si su medicamento es muy caro, por favor, pngase en contacto con Rolm Gala llamando al [phone] y presione la opcin 4 o envenos un mensaje a travs de Clinical cytogeneticist.   No podemos decirle cul ser su copago por los medicamentos por adelantado ya que esto es diferente dependiendo de la cobertura de su seguro. Sin embargo, es posible que podamos encontrar un medicamento sustituto a Audiological scientist un formulario para que el seguro cubra el medicamento que se considera necesario.   Si se requiere una autorizacin previa para que su compaa de seguros [place] su medicamento, por favor permtanos de 1 a 2 das hbiles para completar [address]  Los precios de los medicamentos varan con frecuencia dependiendo del Environmental consultant de dnde se surte la receta y alguna farmacias pueden ofrecer precios ms baratos.  El sitio web www.goodrx.com tiene cupones para medicamentos de Health and safety inspector. Los precios aqu no tienen en cuenta lo que podra costar con la ayuda del seguro (puede ser ms barato con su seguro), pero el sitio web puede darle el precio si no utiliz Tourist information centre manager.  - Puede imprimir el cupn correspondiente y llevarlo con su receta a la farmacia.  - Tambin puede pasar por nuestra oficina durante el horario de atencin regular y Education officer, museum una tarjeta de cupones de GoodRx.  - Si necesita que su receta se enve electrnicamente a una farmacia diferente, informe a nuestra oficina a travs de MyChart de [place] o por telfono llamando al [phone] y presione la opcin 4.

## 2023-03-26 NOTE — Progress Notes (Signed)
Follow-Up Visit   Subjective  Christine Higgins is a 67 y.o. female who presents for the following: patient here for follow up on scalp, b/l toenails and feet, patient also reports itchy rash on top of left breast and itchy rash under breast and body folds.   The patient has spots, moles and lesions to be evaluated, some may be new or changing and the patient may have concern these could be cancer.   The following portions of the chart were reviewed this encounter and updated as appropriate: medications, allergies, medical history  Review of Systems:  No other skin or systemic complaints except as noted in HPI or Assessment and Plan.  Objective  Well appearing patient in no apparent distress; mood and affect are within normal limits.   A focused examination was performed of the following areas: B/l feet, scalp, breast b/l   Relevant exam findings are noted in the Assessment and Plan.    Assessment & Plan   Psoriatic Arthritis   Treatment Plan: Patient followed by rheumatologist Started on Simponi Ra for PsA - itchy scalp has improved while on medication.   Discussed Taltz or Cosentyx as another recommendation if not able to get back on Simponi   INTERTRIGO Exam Erythematous macerated patches at b/l inframammary   Chronic and persistent condition with duration or expected duration over one year. Condition is bothersome/symptomatic for patient. Currently flared.   Intertrigo is a chronic recurrent rash that occurs in skin fold areas that may be associated with friction; heat; moisture; yeast; fungus; and bacteria.  It is exacerbated by increased movement / activity; sweating; and higher atmospheric temperature.  Treatment Plan Start ketoconazole cream apply topically 1-2 times daily to itchy rash under breast and body folds   Recommend OTC Zeasorb AF powder to body folds daily after shower.  It is often found in the athlete's foot section in the pharmacy.  Avoid using  powders that contain cornstarch.   Lichen Simplex Chronicus Exam: hyperpigmented lichenified patch at left upper breast  Chronic and persistent condition with duration or expected duration over one year. Condition is bothersome/symptomatic for patient. Currently flared.  Treatment Plan: Stop TMC 0.1% cream  Avoid scratching area  Start Clobetasol cream - apply twice daily to affected areas of breast for 2 -4 weeks until itching resolved. Caution skin atrophy with long-term use.  Avoid face/groin/ body folds   Topical steroids (such as triamcinolone, fluocinolone, fluocinonide, mometasone, clobetasol, halobetasol, betamethasone, hydrocortisone) can cause thinning and lightening of the skin if they are used for too long in the same area. Your physician has selected the right strength medicine for your problem and area affected on the body. Please use your medication only as directed by your physician to prevent side effects.   Seborrheic dermatitis vrs mild psoriasis Scalp Exam :  Mild scaling at scalp   Chronic and persistent condition with duration or expected duration over one year. Condition is symptomatic/ bothersome to patient. Not currently at goal since ran out of meds.  Well-controlled when on treatment.    Seborrheic Dermatitis -  is a chronic persistent rash characterized by pinkness and scaling most commonly of the mid face but also can occur on the scalp (dandruff), ears; mid chest, mid back and groin.  It tends to be exacerbated by stress and cooler weather.  People who have neurologic disease may experience new onset or exacerbation of existing seborrheic dermatitis.  The condition is not curable but treatable and can be controlled.  Continue ketoconazole 2% shampoo to scalp 1x/wk, let sit 10 minutes before rinsing. 1 yr rfs sent to [facility]   Continue Clobetasol solution 1-2 drops to AA scalp every day/bid prn itch. Avoid face, groin, axilla.   Continue  fluocinolone acetonide scalp 0.01 % oil Apply topically to scalp twice daily as needed for itchy dry scalp 1 yr rfs sent [facility]  Lichen simplex chronicus  Related Medications clobetasol cream (TEMOVATE) 0.05 % Apply topically bid to aa of rash at left breast for 2 - 4 weeks prn. Avoid applying to face, groin, and axilla. Use as directed.  Onychomycosis  Related Medications terbinafine (LAMISIL) 250 MG tablet Take 1 tablet (250 mg total) by mouth daily. Take with food.  Seborrheic dermatitis  Related Medications clobetasol (TEMOVATE) 0.05 % external solution APPLY 1-2 DROPS TO AFFECTED AREA SCALP AS NEEDED FOR ITCH. AVOID FACE, GROIN, AXILLA.  ketoconazole (NIZORAL) 2 % shampoo Massage into scalp and let sit 10-15 mins before rinsing. Use 1x/week.  Fluocinolone Acetonide Body 0.01 % OIL Apply to scalp twice daily as needed for itch.  Erythema intertrigo  Related Medications ketoconazole (NIZORAL) 2 % cream Apply to affected areas of itchy rash under breast, body folds qd/bid qhs.  Onychomycosis +/- psoriasis nail dystrophy feet, toenails  Exam: thickened keratotic, discolored toenails with subungual debris  Chronic and persistent condition with duration or expected duration over one year. Condition is symptomatic/ bothersome to patient. Not currently at goal.   Pt has had one month of terbinafine and feels like she has had some improvement.  Reviewed LFTs from 02/10/23 wnl   Continue terbinafine 250 mg tab - take 1 PO qd with food dsp for 3 months . Rx sent to [facility]   Stop Ciclopirox Cream   Start ketoconzole cream - apply topically to feet and between toes nightly    Terbinafine Counseling   Terbinafine is an anti-fungal medicine that can be applied to the skin (over the counter) or taken by mouth (prescription) to treat fungal infections. The pill version is often used to treat fungal infections of the nails or scalp. While most people do not have  any side effects from taking terbinafine pills, some possible side effects of the medicine can include taste changes, headache, loss of smell, vision changes, nausea, vomiting, or diarrhea.    Rare side effects can include irritation of the liver, allergic reaction, or decrease in blood counts (which may show up as not feeling well or developing an infection). If you are concerned about any of these side effects, please stop the medicine and call your doctor, or in the case of an emergency such as feeling very unwell, seek immediate medical care.   Return for 3 - 4 month follow up.  I, Asher Muir, CMA, am acting as scribe for Willeen Niece, MD.   Documentation: I have reviewed the above documentation for accuracy and completeness, and I agree with the above.  Willeen Niece, MD

## 2023-04-07 ENCOUNTER — Encounter: Payer: Self-pay | Admitting: Student in an Organized Health Care Education/Training Program

## 2023-04-07 ENCOUNTER — Ambulatory Visit
Admission: RE | Admit: 2023-04-07 | Discharge: 2023-04-07 | Disposition: A | Discharge status: Home / Self Care | Payer: Medicare HMO | Source: Ambulatory Visit | Attending: Student in an Organized Health Care Education/Training Program | Admitting: Student in an Organized Health Care Education/Training Program

## 2023-04-07 ENCOUNTER — Ambulatory Visit
Payer: Medicare HMO | Attending: Student in an Organized Health Care Education/Training Program | Admitting: Student in an Organized Health Care Education/Training Program

## 2023-04-07 DIAGNOSIS — M5412 Radiculopathy, cervical region: Secondary | ICD-10-CM | POA: Diagnosis not present

## 2023-04-07 DIAGNOSIS — G894 Chronic pain syndrome: Secondary | ICD-10-CM | POA: Diagnosis not present

## 2023-04-07 DIAGNOSIS — Z981 Arthrodesis status: Secondary | ICD-10-CM | POA: Diagnosis not present

## 2023-04-07 MED ORDER — DEXAMETHASONE SODIUM PHOSPHATE 10 MG/ML IJ SOLN
10.0000 mg | Freq: Once | INTRAMUSCULAR | Status: AC
  Administered 2023-04-07: 10 mg
  Filled 2023-04-07: qty 1

## 2023-04-07 MED ORDER — IOHEXOL 180 MG/ML  SOLN
10.0000 mL | Freq: Once | INTRAMUSCULAR | Status: AC
  Administered 2023-04-07: 10 mL via EPIDURAL
  Filled 2023-04-07: qty 20

## 2023-04-07 MED ORDER — SODIUM CHLORIDE 0.9% FLUSH
1.0000 mL | Freq: Once | INTRAVENOUS | Status: AC
  Administered 2023-04-07: 10 mL

## 2023-04-07 MED ORDER — DIAZEPAM 5 MG PO TABS
ORAL_TABLET | ORAL | Status: AC
  Filled 2023-04-07: qty 1

## 2023-04-07 MED ORDER — DIAZEPAM 5 MG PO TABS
5.0000 mg | ORAL_TABLET | ORAL | Status: AC
  Administered 2023-04-07: 5 mg via ORAL

## 2023-04-07 MED ORDER — LIDOCAINE HCL 2 % IJ SOLN
20.0000 mL | Freq: Once | INTRAMUSCULAR | Status: AC
  Administered 2023-04-07: 400 mg
  Filled 2023-04-07: qty 20

## 2023-04-07 MED ORDER — ROPIVACAINE HCL 2 MG/ML IJ SOLN
1.0000 mL | Freq: Once | INTRAMUSCULAR | Status: AC
  Administered 2023-04-07: 20 mL via EPIDURAL
  Filled 2023-04-07: qty 20

## 2023-04-07 NOTE — Patient Instructions (Signed)

## 2023-04-07 NOTE — Progress Notes (Signed)
PROVIDER NOTE: Interpretation of information contained herein should be left to medically-trained personnel. Specific patient instructions are provided elsewhere under "Patient Instructions" section of medical record. This document was created in part using STT-dictation technology, any transcriptional errors that may result from this process are unintentional.  Patient: Christine Higgins Type: Established DOB: May 29, 1956 MRN: [id number] PCP: Lauro Regulus, MD  Service: Procedure DOS: 04/07/2023 Setting: Ambulatory Location: Ambulatory outpatient facility Delivery: Face-to-face Provider: Edward Jolly, MD Specialty: Interventional Pain Management Specialty designation: 09 Location: Outpatient facility Ref. Prov.: Edward Jolly, MD       Interventional Therapy   Procedure: Cervical Epidural Steroid injection (CESI) (Interlaminar) #2  Laterality: Left  Level: C7-T1 Imaging: Fluoroscopy-assisted DOS: 04/07/2023  Performed by: Edward Jolly, MD Anesthesia: Local anesthesia (1-2% Lidocaine)   Purpose: Diagnostic/Therapeutic Indications: Cervicalgia, cervical radicular pain, degenerative disc disease, severe enough to impact quality of life or function. 1. Cervical radicular pain (R>L)   2. S/P cervical spinal fusion   3. Chronic pain syndrome    NAS-11 score:   Pre-procedure: 7 /10   Post-procedure: 8 /10      Position  Prep  Materials:  Location setting: Procedure suite Position: Prone, on modified reverse trendelenburg to facilitate breathing, with head in head-cradle. Pillows positioned under chest (below chin-level) with cervical spine flexed. Safety Precautions: Patient was assessed for positional comfort and pressure points before starting the procedure. Prepping solution: DuraPrep (Iodine Povacrylex [0.7% available iodine] and Isopropyl Alcohol, 74% w/w) Prep Area: Entire  cervicothoracic region Approach: percutaneous, paramedial Intended target: Posterior cervical  epidural space Materials Procedure:  Tray: Epidural Needle(s): Epidural (Tuohy) Qty: 1 Length: (90mm) 3.5-inch Gauge: 22G   Pre-op H&P Assessment:  [name] is a 67 y.o. (year old), female patient, seen today for interventional treatment. She  has a past surgical history that includes carpel tunn (Right); Hand surgery (Right); carpel tunnel (Left); Cesarean section; Shoulder surgery (Right); Back surgery; Neck surgery; Total hip arthroplasty (Right); Excision Morton's neuroma (Left, 02/05/2017); Colonoscopy; Joint replacement; Esophagogastroduodenoscopy (egd) with propofol (N/A, 06/26/2017); Knee Arthroplasty (Left, 08/11/2017); Knee arthroscopy (Right, 07/02/2018); Reverse shoulder arthroplasty (Left, 06/24/2019); Thoracic laminectomy for spinal cord stimulator (N/A, 08/07/2020); Colonoscopy with propofol (N/A, 10/20/2020); Cardiac catheterization (2006); Tubal ligation; Knee Arthroplasty (Right, 02/05/2021); Spinal cord stimulator removal (N/A, 04/09/2021); Cataract extraction (Bilateral, 2022); Fracture surgery (1969); Lumbar laminectomy/decompression microdiscectomy (N/A, 06/19/2022); and Dilatation & curettage/hysteroscopy with myosure (N/A, 10/24/2022). [name] has a current medication list which includes the following prescription(s): acetaminophen, albuterol, amlodipine, atorvastatin, budesonide-formoterol, calcium carbonate, celecoxib, vitamin d3 super strength, clobetasol, clobetasol cream, clonazepam, duloxetine, duloxetine, esomeprazole, fluocinolone acetonide body, fluticasone, gabapentin, hydrocodone-acetaminophen, ketoconazole, levocetirizine, linaclotide, losartan, methimazole, methocarbamol, mounjaro, multivitamin with minerals, potassium chloride, propranolol, sucralfate, torsemide, trazodone, ibandronate, ketoconazole, naloxone hcl, pregabalin, and terbinafine. Her primarily concern today is the Neck Pain  Initial Vital Signs:  Pulse/HCG Rate: 82ECG Heart Rate: 79 Temp: (!) 97.3 F  (36.3 C) Resp: 18 BP: (!) 142/78 SpO2: 99 %  BMI: Estimated body mass index is 33.75 kg/m as calculated from the following:   Height as of this encounter: 5\' 11"  (1.803 m).   Weight as of this encounter: 242 lb (109.8 kg).  Risk Assessment: Allergies: Reviewed. She is allergic to cephalexin, ibuprofen, potassium chloride, shellfish allergy, and aspirin.  Allergy Precautions: None required Coagulopathies: Reviewed. None identified.  Blood-thinner therapy: None at this time Active Infection(s): Reviewed. None identified. [name] is afebrile  Site Confirmation: [name] was asked to confirm the procedure and laterality before marking the site  Procedure checklist: Completed Consent: Before the procedure and under the influence of no sedative(s), amnesic(s), or anxiolytics, the patient was informed of the treatment options, risks and possible complications. To fulfill our ethical and legal obligations, as recommended by the American Medical Association's Code of Ethics, I have informed the patient of my clinical impression; the nature and purpose of the treatment or procedure; the risks, benefits, and possible complications of the intervention; the alternatives, including doing nothing; the risk(s) and benefit(s) of the alternative treatment(s) or procedure(s); and the risk(s) and benefit(s) of doing nothing. The patient was provided information about the general risks and possible complications associated with the procedure. These may include, but are not limited to: failure to achieve desired goals, infection, bleeding, organ or nerve damage, allergic reactions, paralysis, and death. In addition, the patient was informed of those risks and complications associated to Spine-related procedures, such as failure to decrease pain; infection (i.e.: Meningitis, epidural or intraspinal abscess); bleeding (i.e.: epidural hematoma, subarachnoid hemorrhage, or any other type of intraspinal or peri-dural  bleeding); organ or nerve damage (i.e.: Any type of peripheral nerve, nerve root, or spinal cord injury) with subsequent damage to sensory, motor, and/or autonomic systems, resulting in permanent pain, numbness, and/or weakness of one or several areas of the body; allergic reactions; (i.e.: anaphylactic reaction); and/or death. Furthermore, the patient was informed of those risks and complications associated with the medications. These include, but are not limited to: allergic reactions (i.e.: anaphylactic or anaphylactoid reaction(s)); adrenal axis suppression; blood sugar elevation that in diabetics may result in ketoacidosis or comma; water retention that in patients with history of congestive heart failure may result in shortness of breath, pulmonary edema, and decompensation with resultant heart failure; weight gain; swelling or edema; medication-induced neural toxicity; particulate matter embolism and blood vessel occlusion with resultant organ, and/or nervous system infarction; and/or aseptic necrosis of one or more joints. Finally, the patient was informed that Medicine is not an exact science; therefore, there is also the possibility of unforeseen or unpredictable risks and/or possible complications that may result in a catastrophic outcome. The patient indicated having understood very clearly. We have given the patient no guarantees and we have made no promises. Enough time was given to the patient to ask questions, all of which were answered to the patient's satisfaction. Ms. Fecteau has indicated that she wanted to continue with the procedure. Attestation: I, the ordering provider, attest that I have discussed with the patient the benefits, risks, side-effects, alternatives, likelihood of achieving goals, and potential problems during recovery for the procedure that I have provided informed consent. Date  Time: 04/07/2023 11:02 AM   Pre-Procedure Preparation:  Monitoring: As per clinic protocol.  Respiration, ETCO2, SpO2, BP, heart rate and rhythm monitor placed and checked for adequate function Safety Precautions: Patient was assessed for positional comfort and pressure points before starting the procedure. Time-out: I initiated and conducted the "Time-out" before starting the procedure, as per protocol. The patient was asked to participate by confirming the accuracy of the "Time Out" information. Verification of the correct person, site, and procedure were performed and confirmed by me, the nursing staff, and the patient. "Time-out" conducted as per Joint Commission's Universal Protocol (UP.01.01.01). Time: 1134 Start Time: 1134 hrs.  Description  Narrative of Procedure:          Rationale (medical necessity): procedure needed and proper for the diagnosis and/or treatment of the patient's medical symptoms and needs. Start Time: 1134 hrs. Safety Precautions: Aspiration looking for blood  return was conducted prior to all injections. At no point did we inject any substances, as a needle was being advanced. No attempts were made at seeking any paresthesias. Safe injection practices and needle disposal techniques used. Medications properly checked for expiration dates. SDV (single dose vial) medications used. Description of procedure: Protocol guidelines were followed. The patient was assisted into a comfortable position. The target area was identified and the area prepped in the usual manner. Skin & deeper tissues infiltrated with local anesthetic. Appropriate amount of time allowed to pass for local anesthetics to take effect. Using fluoroscopic guidance, the epidural needle was introduced through the skin, ipsilateral to the reported pain, and advanced to the target area. Posterior laminar os was contacted and the needle walked caudad, until the lamina was cleared. The ligamentum flavum was engaged and the epidural space identified using "loss-of-resistance technique" with 2-3 ml of PF-NaCl (0.9%  NSS), in a 5cc dedicated LOR syringe. (See "Imaging guidance" below for use of contrast details.) Once proper needle placement was secured, and negative aspiration confirmed, the solution was injected in intermittent fashion, asking for systemic symptoms every 0.5cc. The needles were then removed and the area cleansed, making sure to leave some of the prepping solution back to take advantage of its long term bactericidal properties.  Vitals:   04/07/23 1108 04/07/23 1136 04/07/23 1138  BP: (!) 142/78 128/82 134/85  Pulse: 82    Resp: 18 18 18   Temp: (!) 97.3 F (36.3 C)    TempSrc: Temporal    SpO2: 99% 100% 100%  Weight: 242 lb (109.8 kg)    Height: 5\' 11"  (1.803 m)        End Time: 1137 hrs.  Imaging Guidance (Spinal):          Type of Imaging Technique: Fluoroscopy Guidance (Spinal) Indication(s): Assistance in needle guidance and placement for procedures requiring needle placement in or near specific anatomical locations not easily accessible without such assistance. Exposure Time: Please see nurses notes. Contrast: Before injecting any contrast, we confirmed that the patient did not have an allergy to iodine, shellfish, or radiological contrast. Once satisfactory needle placement was completed at the desired level, radiological contrast was injected. Contrast injected under live fluoroscopy. No contrast complications. See chart for type and volume of contrast used. Fluoroscopic Guidance: I was personally present during the use of fluoroscopy. "Tunnel Vision Technique" used to obtain the best possible view of the target area. Parallax error corrected before commencing the procedure. "Direction-depth-direction" technique used to introduce the needle under continuous pulsed fluoroscopy. Once target was reached, antero-posterior, oblique, and lateral fluoroscopic projection used confirm needle placement in all planes. Images permanently stored in EMR. Interpretation: I personally interpreted  the imaging intraoperatively. Adequate needle placement confirmed in multiple planes. Appropriate spread of contrast into desired area was observed. No evidence of afferent or efferent intravascular uptake. No intrathecal or subarachnoid spread observed. Permanent images saved into the patient's record.  Post-operative Assessment:  Post-procedure Vital Signs:  Pulse/HCG Rate: 8279 Temp: (!) 97.3 F (36.3 C) Resp: 18 BP: 134/85 SpO2: 100 %  EBL: None  Complications: No immediate post-treatment complications observed by team, or reported by patient.  Note: The patient tolerated the entire procedure well. A repeat set of vitals were taken after the procedure and the patient was kept under observation following institutional policy, for this type of procedure. Post-procedural neurological assessment was performed, showing return to baseline, prior to discharge. The patient was provided with post-procedure discharge instructions, including a  section on how to identify potential problems. Should any problems arise concerning this procedure, the patient was given instructions to immediately contact us, at any time, without hesitation. In any case, we plan to contact the patient by telephone for a follow-up status report regarding this interventional procedure.  Comments:  No additional relevant information.  Plan of Care (POC)  Orders:  Orders Placed This Encounter  Procedures   DG PAIN CLINIC C-ARM 1-60 MIN NO REPORT    Intraoperative interpretation by procedural physician at [facility].    Standing Status:   Standing    Number of Occurrences:   1    Order Specific Question:   Reason for exam:    Answer:   Assistance in needle guidance and placement for procedures requiring needle placement in or near specific anatomical locations not easily accessible without such assistance.     Medications ordered for procedure: Meds ordered this encounter  Medications   iohexol  (OMNIPAQUE) 180 MG/ML injection 10 mL    Must be Myelogram-compatible. If not available, you may substitute with a water-soluble, non-ionic, hypoallergenic, myelogram-compatible radiological contrast medium.   lidocaine (XYLOCAINE) 2 % (with pres) injection 400 mg   ropivacaine (PF) 2 mg/mL (0.2%) (NAROPIN) injection 1 mL   sodium chloride flush (NS) 0.9 % injection 1 mL   dexamethasone (DECADRON) injection 10 mg   diazepam (VALIUM) tablet 5 mg    Make sure Flumazenil is available in the pyxis when using this medication. If oversedation occurs, administer 0.2 mg IV over 15 sec. If after 45 sec no response, administer 0.2 mg again over 1 min; may repeat at 1 min intervals; not to exceed 4 doses (1 mg)   Medications administered: We administered iohexol, lidocaine, ropivacaine (PF) 2 mg/mL (0.2%), sodium chloride flush, dexamethasone, and diazepam.  See the medical record for exact dosing, route, and time of administration.  Follow-up plan:   Return in about 6 weeks (around 05/19/2023) for Post Procedure Evaluation, virtual.       Recent Visits Date Type Provider Dept  03/18/23 Office Visit Edward Jolly, MD [facility]  02/05/23 Procedure visit Edward Jolly, MD [facility]  Showing recent visits within past 90 days and meeting all other requirements Today's Visits Date Type Provider Dept  04/07/23 Procedure visit Edward Jolly, MD [facility]  Showing today's visits and meeting all other requirements Future Appointments No visits were found meeting these conditions. Showing future appointments within next 90 days and meeting all other requirements  Disposition: Discharge home  Discharge (Date  Time): 04/07/2023; 1145 hrs.   Primary Care Physician: Lauro Regulus, MD Location: [facility] Note by: Edward Jolly, MD (TTS technology used. I apologize for any typographical errors that were not detected and  corrected.) Date: 04/07/2023; Time: 11:44 AM  Disclaimer:  Medicine is not an Visual merchandiser. The only guarantee in medicine is that nothing is guaranteed. It is important to note that the decision to proceed with this intervention was based on the information collected from the patient. The Data and conclusions were drawn from the patient's questionnaire, the interview, and the physical examination. Because the information was provided in large part by the patient, it cannot be guaranteed that it has not been purposely or unconsciously manipulated. Every effort has been made to obtain as much relevant data as possible for this evaluation. It is important to note that the conclusions that lead to this procedure are derived in large part from  the available data. Always take into account that the treatment will also be dependent on availability of resources and existing treatment guidelines, considered by other Pain Management Practitioners as being common knowledge and practice, at the time of the intervention. For Medico-Legal purposes, it is also important to point out that variation in procedural techniques and pharmacological choices are the acceptable norm. The indications, contraindications, technique, and results of the above procedure should only be interpreted and judged by a Board-Certified Interventional Pain Specialist with extensive familiarity and expertise in the same exact procedure and technique.

## 2023-04-07 NOTE — Progress Notes (Signed)
Safety precautions to be maintained throughout the outpatient stay will include: orient to surroundings, keep bed in low position, maintain call bell within reach at all times, provide assistance with transfer out of bed and ambulation.  

## 2023-04-08 ENCOUNTER — Telehealth: Payer: Self-pay

## 2023-04-08 NOTE — Telephone Encounter (Signed)
Post procedure follow up.  No answer.  

## 2023-04-10 DIAGNOSIS — E1142 Type 2 diabetes mellitus with diabetic polyneuropathy: Secondary | ICD-10-CM | POA: Diagnosis not present

## 2023-04-10 DIAGNOSIS — R600 Localized edema: Secondary | ICD-10-CM | POA: Diagnosis not present

## 2023-04-10 DIAGNOSIS — M79675 Pain in left toe(s): Secondary | ICD-10-CM | POA: Diagnosis not present

## 2023-04-10 DIAGNOSIS — H524 Presbyopia: Secondary | ICD-10-CM | POA: Diagnosis not present

## 2023-04-10 DIAGNOSIS — M79662 Pain in left lower leg: Secondary | ICD-10-CM | POA: Diagnosis not present

## 2023-04-10 DIAGNOSIS — M79674 Pain in right toe(s): Secondary | ICD-10-CM | POA: Diagnosis not present

## 2023-04-10 DIAGNOSIS — B351 Tinea unguium: Secondary | ICD-10-CM | POA: Diagnosis not present

## 2023-04-10 DIAGNOSIS — M79661 Pain in right lower leg: Secondary | ICD-10-CM | POA: Diagnosis not present

## 2023-04-10 DIAGNOSIS — R399 Unspecified symptoms and signs involving the genitourinary system: Secondary | ICD-10-CM | POA: Diagnosis not present

## 2023-04-15 DIAGNOSIS — M47816 Spondylosis without myelopathy or radiculopathy, lumbar region: Secondary | ICD-10-CM | POA: Diagnosis not present

## 2023-04-21 DIAGNOSIS — E119 Type 2 diabetes mellitus without complications: Secondary | ICD-10-CM | POA: Diagnosis not present

## 2023-05-14 DIAGNOSIS — M5416 Radiculopathy, lumbar region: Secondary | ICD-10-CM | POA: Diagnosis not present

## 2023-05-14 DIAGNOSIS — M9983 Other biomechanical lesions of lumbar region: Secondary | ICD-10-CM | POA: Diagnosis not present

## 2023-05-19 ENCOUNTER — Telehealth: Payer: Medicare HMO | Admitting: Student in an Organized Health Care Education/Training Program

## 2023-05-19 DIAGNOSIS — E119 Type 2 diabetes mellitus without complications: Secondary | ICD-10-CM | POA: Diagnosis not present

## 2023-05-21 ENCOUNTER — Telehealth: Payer: Self-pay | Admitting: *Deleted

## 2023-05-21 ENCOUNTER — Ambulatory Visit
Payer: Medicare HMO | Attending: Student in an Organized Health Care Education/Training Program | Admitting: Student in an Organized Health Care Education/Training Program

## 2023-05-21 DIAGNOSIS — M5412 Radiculopathy, cervical region: Secondary | ICD-10-CM

## 2023-05-21 NOTE — Progress Notes (Signed)
I attempted to call the patient however no response. Voicemail left instructing patient to call front desk office at [phone] to reschedule appointment. -Dr Yamileth Hayse  

## 2023-05-21 NOTE — Telephone Encounter (Signed)
Attempted to call for pre appt PPE. Message left.

## 2023-05-26 ENCOUNTER — Encounter: Payer: Self-pay | Admitting: Student in an Organized Health Care Education/Training Program

## 2023-05-26 ENCOUNTER — Telehealth: Payer: Self-pay | Admitting: *Deleted

## 2023-05-26 NOTE — Telephone Encounter (Signed)
Attempted to call for pre appointment review of post procedure eval.  Message left.

## 2023-05-27 ENCOUNTER — Ambulatory Visit
Payer: Medicare HMO | Attending: Student in an Organized Health Care Education/Training Program | Admitting: Student in an Organized Health Care Education/Training Program

## 2023-05-27 ENCOUNTER — Telehealth: Payer: Self-pay | Admitting: Student in an Organized Health Care Education/Training Program

## 2023-05-27 DIAGNOSIS — M4802 Spinal stenosis, cervical region: Secondary | ICD-10-CM | POA: Diagnosis not present

## 2023-05-27 DIAGNOSIS — M5416 Radiculopathy, lumbar region: Secondary | ICD-10-CM | POA: Diagnosis not present

## 2023-05-27 DIAGNOSIS — G894 Chronic pain syndrome: Secondary | ICD-10-CM

## 2023-05-27 DIAGNOSIS — Z9889 Other specified postprocedural states: Secondary | ICD-10-CM

## 2023-05-28 NOTE — Progress Notes (Signed)
Patient: Christine Higgins  Service Category: E/M  Provider: Edward Jolly, MD  DOB: 09-07-1955  DOS: 05/27/2023  Location: Office  MRN: [id number]  Setting: Ambulatory outpatient  Referring Provider: Lauro Regulus, MD  Type: Established Patient  Specialty: Interventional Pain Management  PCP: Lauro Regulus, MD  Location: Remote location  Delivery: TeleHealth     Virtual Encounter - Pain Management PROVIDER NOTE: Information contained herein reflects review and annotations entered in association with encounter. Interpretation of such information and data should be left to medically-trained personnel. Information provided to patient can be located elsewhere in the medical record under "Patient Instructions". Document created using STT-dictation technology, any transcriptional errors that may result from process are unintentional.    Contact & Pharmacy Preferred: [phone] Home: [phone] (home) Mobile: There is no such number on file (mobile). E-mail: [email or web address]  [facility] [place], [place] - [address] [place] [address] Phone: [phone] Fax: [phone]   Pre-screening  Ms. Rappe offered "in-person" vs "virtual" encounter. She indicated preferring virtual for this encounter.   Reason COVID-19*  Social distancing based on CDC and AMA recommendations.   I contacted Christine Higgins on 05/27/2023 via telephone.      I clearly identified myself as Edward Jolly, MD. I verified that I was speaking with the correct person using two identifiers (Name: CHAZMINE MISHOE, and date of birth: 05/19/1956).  Consent I sought verbal advanced consent from Christine Higgins for virtual visit interactions. I informed Ms. Cisson of possible security and privacy concerns, risks, and limitations associated with providing "not-in-person" medical evaluation and management services. I also informed Ms. Wambach of the availability of "in-person"  appointments. Finally, I informed her that there would be a charge for the virtual visit and that she could be  personally, fully or partially, financially responsible for it. Ms. Rutherford expressed understanding and agreed to proceed.   Historic Elements   Ms. ALLIEANNA GEHRING is a 67 y.o. year old, female patient evaluated today after our last contact on 05/27/2023. Ms. Sivils  has a past medical history of Anxiety, Asthma, Breast mass, Cervical disc disease, Chronic pain syndrome, COPD (chronic obstructive pulmonary disease) (HCC), Degenerative disc disease, lumbar, Depression, Diabetes mellitus without complication (HCC), Fibromyalgia, Fibromyalgia, GERD (gastroesophageal reflux disease), Glaucoma, Graves disease, Headache, Hemorrhoids, History of hiatal hernia, Hyperlipidemia, Hypertension, Hyperthyroidism, Lumbar disc disease, Obesity, Pre-diabetes, Psoriatic arthritis (HCC), and Thyroid disease. She also  has a past surgical history that includes carpel tunn (Right); Hand surgery (Right); carpel tunnel (Left); Cesarean section; Shoulder surgery (Right); Back surgery; Neck surgery; Total hip arthroplasty (Right); Excision Morton's neuroma (Left, 02/05/2017); Colonoscopy; Joint replacement; Esophagogastroduodenoscopy (egd) with propofol (N/A, 06/26/2017); Knee Arthroplasty (Left, 08/11/2017); Knee arthroscopy (Right, 07/02/2018); Reverse shoulder arthroplasty (Left, 06/24/2019); Thoracic laminectomy for spinal cord stimulator (N/A, 08/07/2020); Colonoscopy with propofol (N/A, 10/20/2020); Cardiac catheterization (2006); Tubal ligation; Knee Arthroplasty (Right, 02/05/2021); Spinal cord stimulator removal (N/A, 04/09/2021); Cataract extraction (Bilateral, 2022); Fracture surgery (1969); Lumbar laminectomy/decompression microdiscectomy (N/A, 06/19/2022); and Dilatation & curettage/hysteroscopy with myosure (N/A, 10/24/2022). Ms. Coulson has a current medication list which includes the following prescription(s):  acetaminophen, albuterol, amlodipine, atorvastatin, budesonide-formoterol, calcium carbonate, celecoxib, vitamin d3 super strength, clobetasol, clobetasol cream, clonazepam, duloxetine, duloxetine, esomeprazole, fluocinolone acetonide body, fluticasone, gabapentin, hydrocodone-acetaminophen, ibandronate, ketoconazole, ketoconazole, levocetirizine, linaclotide, losartan, methimazole, methocarbamol, mounjaro, multivitamin with minerals, naloxone hcl, potassium chloride, pregabalin, propranolol, sucralfate, terbinafine, torsemide, and trazodone. She  reports that she quit smoking about 30 years  ago. Her smoking use included cigarettes. She has never used smokeless tobacco. She reports that she does not currently use drugs after having used the following drugs: "Crack" cocaine and Marijuana. She reports that she does not drink alcohol. Ms. Oreilly is allergic to cephalexin, ibuprofen, potassium chloride, shellfish allergy, and aspirin.  BMI: Estimated body mass index is 33.75 kg/m as calculated from the following:   Height as of 04/07/23: 5\' 11"  (1.803 m).   Weight as of 04/07/23: 242 lb (109.8 kg). Last encounter: 05/21/2023. Last procedure: 04/07/2023.  HPI  Today, she is being contacted for a post-procedure assessment.    Post-procedure evaluation   Procedure: Cervical Epidural Steroid injection (CESI) (Interlaminar) #2  Laterality: Left  Level: C7-T1 Imaging: Fluoroscopy-assisted DOS: 04/07/2023  Performed by: Edward Jolly, MD Anesthesia: Local anesthesia (1-2% Lidocaine)   Purpose: Diagnostic/Therapeutic Indications: Cervicalgia, cervical radicular pain, degenerative disc disease, severe enough to impact quality of life or function. 1. Cervical radicular pain (R>L)   2. S/P cervical spinal fusion   3. Chronic pain syndrome    NAS-11 score:   Pre-procedure: 7 /10   Post-procedure: 8 /10      Effectiveness:  Initial hour after procedure: 75 %  Subsequent 4-6 hours post-procedure: 75 %   Analgesia past initial 6 hours: 75% pain relief for 10-12 days Ongoing improvement:  Analgesic:  <20% Function: Back to baseline ROM: Back to baseline   Pharmacotherapy Assessment   Opioid Analgesic: Tramadol 100 mg twice daily, quantity 120/month MME equals 20   Monitoring: [place] PMP: PDMP reviewed during this encounter.       Pharmacotherapy: No side-effects or adverse reactions reported. Compliance: No problems identified. Effectiveness: Clinically acceptable. Plan: Refer to "POC". UDS:  Summary  Date Value Ref Range Status  11/15/2021 Note  Final    Comment:    ==================================================================== [employer] Select 13 (MW) ==================================================================== Test                             Result       Flag       Units  Drug Present and Declared for Prescription Verification   7-aminoclonazepam              42           EXPECTED   ng/mg creat    7-aminoclonazepam is an expected metabolite of clonazepam. Source of    clonazepam is a scheduled prescription medication.    Tramadol                       >6944        EXPECTED   ng/mg creat   O-Desmethyltramadol            5594         EXPECTED   ng/mg creat   N-Desmethyltramadol            >6944        EXPECTED   ng/mg creat    Source of tramadol is a prescription medication. O-desmethyltramadol    and N-desmethyltramadol are expected metabolites of tramadol.  ==================================================================== Test                      Result    Flag   Units      Ref Range   Creatinine              72  mg/dL      >=56 ==================================================================== Declared Medications:  The flagging and interpretation on this report are based on the  following declared medications.  Unexpected results may arise from  inaccuracies in the declared medications.   **Note: The testing scope of this panel  includes these medications:   Clonazepam (Klonopin)  Tramadol (Ultram)   **Note: The testing scope of this panel does not include the  following reported medications:   Acetaminophen (Tylenol)  Atorvastatin (Lipitor)  Budesonide (Symbicort)  Calcium  Celecoxib (Celebrex)  Ciclopirox (Loprox)  Clobetasol (Temovate)  Diphenhydramine (Benadryl)  Duloxetine (Cymbalta)  Esomeprazole (Nexium)  Fexofenadine (Allegra)  Fluticasone (Flonase)  Formoterol (Symbicort)  Gabapentin (Neurontin)  Hydralazine (Apresoline)  Hydrochlorothiazide (Hyzaar)  Ketoconazole (Nizoral)  Linaclotide (Linzess)  Losartan (Hyzaar)  Methimazole (Tapazole)  Methocarbamol (Robaxin)  Multivitamin  Probiotic  Propranolol (Inderal)  Sucralfate (Carafate)  Terbinafine (Lamisil)  Trazodone (Desyrel)  Triamcinolone (Kenalog)  Turmeric  Ubiquinone (CoQ10)  Vitamin A  Vitamin B12  Vitamin C  Vitamin D3  Vitamin E ==================================================================== For clinical consultation, please call [phone]. ====================================================================    No results found for: "CBDTHCR", "D8THCCBX", "D9THCCBX"   Laboratory Chemistry Profile   Renal Lab Results  Component Value Date   BUN 7 (L) 02/05/2023   CREATININE 0.67 02/05/2023   BCR 11 (L) 04/27/2020   GFRAA 83 04/27/2020   GFRNONAA >60 02/05/2023    Hepatic Lab Results  Component Value Date   AST 23 02/05/2023   ALT 20 02/05/2023   ALBUMIN 5.1 (H) 02/05/2023   ALKPHOS 89 02/05/2023   HCVAB NON REACTIVE 02/05/2023   LIPASE 27 12/14/2016    Electrolytes Lab Results  Component Value Date   NA 135 02/05/2023   K 4.0 02/05/2023   CL 100 02/05/2023   CALCIUM 10.2 02/05/2023   MG 1.9 08/27/2014    Bone No results found for: "VD25OH", "VD125OH2TOT", "ON6295MW4", "XL2440NU2", "25OHVITD1", "25OHVITD2", "25OHVITD3", "TESTOFREE", "TESTOSTERONE"  Inflammation (CRP: Acute Phase) (ESR:  Chronic Phase) Lab Results  Component Value Date   CRP <0.5 02/05/2023   ESRSEDRATE 6 01/25/2021         Note: Above Lab results reviewed.  Assessment  The primary encounter diagnosis was Foraminal stenosis of cervical region. Diagnoses of Lumbar radiculopathy, History of lumbar laminectomy for spinal cord decompression, and Chronic pain syndrome were also pertinent to this visit.  Plan of Care  Patient is complaining of low back and bilateral leg pain.  She has a history of lumbar spine surgery and chronic lumbar radicular pain.  She has an L3-L4 fusion with moderate bilateral neuroforaminal stenosis along with moderate spinal stenosis and moderate neuroforaminal stenosis at L5-S1 based upon her CT of her lumbar spine this past summer.  She has found benefit with previous caudal epidural steroid injections her last 1 being in June.  She experienced 75% pain relief for approximately 4 months.  Given increased radicular pain, we discussed repeating caudal ESI 3 to 4 weeks.  Patient endorsed understanding.    Follow-up plan:   Return in about 4 weeks (around 06/24/2023) for Caudal ESI.      Status post right L5-S1 ESI 03/23/2019, 6 out of 8 cc injected, #2 on 04/21/19, 5 cc more concentrated, #3 on 06/14/2019: 6 cc, #4 09/29/19 left L5/S1 IL , #5 12/08/19, #6 02/23/20. [employer] SCS trial 06/26/20 significant functional and analgesic benefit.  Patient ambulated during clinic which she has never done before.  Plan for implant with Dr Adriana Simas  Recent Visits Date Type Provider Dept  05/27/23 Office Visit Edward Jolly, MD [facility]  05/21/23 Office Visit Edward Jolly, MD [facility]  04/07/23 Procedure visit Edward Jolly, MD [facility]  03/18/23 Office Visit Edward Jolly, MD [facility]  Showing recent visits within past 90 days and meeting all other requirements Future Appointments No visits were found meeting these  conditions. Showing future appointments within next 90 days and meeting all other requirements  I discussed the assessment and treatment plan with the patient. The patient was provided an opportunity to ask questions and all were answered. The patient agreed with the plan and demonstrated an understanding of the instructions.  Patient advised to call back or seek an in-person evaluation if the symptoms or condition worsens.  Duration of encounter: 15 minutes.  Note by: Edward Jolly, MD Date: 05/27/2023; Time: 9:40 AM

## 2023-05-29 NOTE — Telephone Encounter (Signed)
Note completed 

## 2023-06-02 ENCOUNTER — Telehealth: Payer: Self-pay

## 2023-06-02 NOTE — Telephone Encounter (Signed)
Her insurance requires documentation of recent provacative tests ex straight leg raise, pain radiating to lower extremities, etc. Recent pain score. Documentation of PT. For authorization for lesi. The most recent visits were [facility] and virtual follow up. Do you want me to bring her in for an eval?

## 2023-06-03 DIAGNOSIS — L4050 Arthropathic psoriasis, unspecified: Secondary | ICD-10-CM | POA: Diagnosis not present

## 2023-06-04 DIAGNOSIS — M654 Radial styloid tenosynovitis [de Quervain]: Secondary | ICD-10-CM | POA: Diagnosis not present

## 2023-06-10 DIAGNOSIS — M5416 Radiculopathy, lumbar region: Secondary | ICD-10-CM | POA: Diagnosis not present

## 2023-06-17 DIAGNOSIS — E1142 Type 2 diabetes mellitus with diabetic polyneuropathy: Secondary | ICD-10-CM | POA: Diagnosis not present

## 2023-06-17 DIAGNOSIS — E118 Type 2 diabetes mellitus with unspecified complications: Secondary | ICD-10-CM | POA: Diagnosis not present

## 2023-06-24 DIAGNOSIS — J4521 Mild intermittent asthma with (acute) exacerbation: Secondary | ICD-10-CM | POA: Diagnosis not present

## 2023-06-24 DIAGNOSIS — E119 Type 2 diabetes mellitus without complications: Secondary | ICD-10-CM | POA: Diagnosis not present

## 2023-06-24 DIAGNOSIS — I10 Essential (primary) hypertension: Secondary | ICD-10-CM | POA: Diagnosis not present

## 2023-06-24 DIAGNOSIS — F325 Major depressive disorder, single episode, in full remission: Secondary | ICD-10-CM | POA: Diagnosis not present

## 2023-06-24 DIAGNOSIS — Z1331 Encounter for screening for depression: Secondary | ICD-10-CM | POA: Diagnosis not present

## 2023-06-24 DIAGNOSIS — Z23 Encounter for immunization: Secondary | ICD-10-CM | POA: Diagnosis not present

## 2023-06-24 DIAGNOSIS — L4050 Arthropathic psoriasis, unspecified: Secondary | ICD-10-CM | POA: Diagnosis not present

## 2023-06-24 DIAGNOSIS — I700 Atherosclerosis of aorta: Secondary | ICD-10-CM | POA: Diagnosis not present

## 2023-06-24 DIAGNOSIS — E7800 Pure hypercholesterolemia, unspecified: Secondary | ICD-10-CM | POA: Diagnosis not present

## 2023-06-24 DIAGNOSIS — Z0000 Encounter for general adult medical examination without abnormal findings: Secondary | ICD-10-CM | POA: Diagnosis not present

## 2023-06-26 DIAGNOSIS — Z79899 Other long term (current) drug therapy: Secondary | ICD-10-CM | POA: Diagnosis not present

## 2023-06-26 DIAGNOSIS — M150 Primary generalized (osteo)arthritis: Secondary | ICD-10-CM | POA: Diagnosis not present

## 2023-06-26 DIAGNOSIS — L4050 Arthropathic psoriasis, unspecified: Secondary | ICD-10-CM | POA: Diagnosis not present

## 2023-06-26 DIAGNOSIS — L409 Psoriasis, unspecified: Secondary | ICD-10-CM | POA: Diagnosis not present

## 2023-06-26 DIAGNOSIS — M797 Fibromyalgia: Secondary | ICD-10-CM | POA: Diagnosis not present

## 2023-07-01 DIAGNOSIS — M4306 Spondylolysis, lumbar region: Secondary | ICD-10-CM | POA: Diagnosis not present

## 2023-07-18 DIAGNOSIS — E119 Type 2 diabetes mellitus without complications: Secondary | ICD-10-CM | POA: Diagnosis not present

## 2023-07-22 ENCOUNTER — Ambulatory Visit: Payer: Medicare HMO | Admitting: Dermatology

## 2023-07-27 ENCOUNTER — Emergency Department
Admission: EM | Admit: 2023-07-27 | Discharge: 2023-07-27 | Disposition: A | Discharge status: Home / Self Care | Payer: Medicare HMO | Attending: Emergency Medicine | Admitting: Emergency Medicine

## 2023-07-27 ENCOUNTER — Other Ambulatory Visit: Payer: Self-pay

## 2023-07-27 ENCOUNTER — Emergency Department: Payer: Medicare HMO

## 2023-07-27 DIAGNOSIS — M545 Low back pain, unspecified: Secondary | ICD-10-CM | POA: Diagnosis not present

## 2023-07-27 DIAGNOSIS — G8929 Other chronic pain: Secondary | ICD-10-CM | POA: Diagnosis not present

## 2023-07-27 DIAGNOSIS — M5127 Other intervertebral disc displacement, lumbosacral region: Secondary | ICD-10-CM | POA: Diagnosis not present

## 2023-07-27 DIAGNOSIS — M5125 Other intervertebral disc displacement, thoracolumbar region: Secondary | ICD-10-CM | POA: Diagnosis not present

## 2023-07-27 DIAGNOSIS — I10 Essential (primary) hypertension: Secondary | ICD-10-CM | POA: Diagnosis not present

## 2023-07-27 DIAGNOSIS — E119 Type 2 diabetes mellitus without complications: Secondary | ICD-10-CM | POA: Insufficient documentation

## 2023-07-27 DIAGNOSIS — M549 Dorsalgia, unspecified: Secondary | ICD-10-CM

## 2023-07-27 DIAGNOSIS — M48061 Spinal stenosis, lumbar region without neurogenic claudication: Secondary | ICD-10-CM | POA: Diagnosis not present

## 2023-07-27 DIAGNOSIS — M47816 Spondylosis without myelopathy or radiculopathy, lumbar region: Secondary | ICD-10-CM | POA: Diagnosis not present

## 2023-07-27 DIAGNOSIS — M5459 Other low back pain: Secondary | ICD-10-CM | POA: Diagnosis not present

## 2023-07-27 NOTE — ED Notes (Signed)
 Patient discharged from ED by provider. Discharge instructions reviewed with patient and all questions answered. Patient wheeled from ED in NAD.

## 2023-07-27 NOTE — ED Triage Notes (Signed)
Pt to ED via POV from home. Pt reports back pain that was from an accident and had to have back surgery.  Pt reports for the past month pain has been getting worse and has been having periods of incontinence. Pt also reports when she tilts her head back she gets a shooting pain from her neck down her spine. Pt has been seeing neurosurgery.

## 2023-07-27 NOTE — ED Provider Notes (Signed)
[facility] Provider Note    Event Date/Time   First MD Initiated Contact with Patient 07/27/23 1505     (approximate)  History   Chief Complaint: No chief complaint on file.  HPI  Christine Higgins is a 68 y.o. female with a past medical history of anxiety, chronic back pain, fibromyalgia, diabetes, hypertension, hyperlipidemia, presents to the emergency department for worsening lower back pain.  According to the patient last year she had surgery done on her lumbar spine by Dr. Lovell Sheehan in [place].  She states since that time she has continued to have pain in her lower back and at times in her neck as well.  States she has seen him multiple times with the same but she feels that she is getting "blown off."  Patient states last night the pain was more significant and she had an episode of incontinence.  She states at times she will have numbness in the legs as well.  She also describes for the past year she will occasionally get shooting pains in her neck when she bends her head backwards.  But denies any worsening of this.  Physical Exam   Triage Vital Signs: ED Triage Vitals [07/27/23 1451]  Encounter Vitals Group     BP (!) 149/97     Systolic BP Percentile      Diastolic BP Percentile      Pulse Rate 84     Resp 20     Temp 98 F (36.7 C)     Temp Source Oral     SpO2 98 %     Weight      Height      Head Circumference      Peak Flow      Pain Score 8     Pain Loc      Pain Education      Exclude from Growth Chart     Most recent vital signs: Vitals:   07/27/23 1451  BP: (!) 149/97  Pulse: 84  Resp: 20  Temp: 98 F (36.7 C)  SpO2: 98%    General: Awake, no distress.  CV:  Good peripheral perfusion. Resp:  Normal effort. Abd:  No distention.  Other:  Good strength in bilateral lower extremities.  Patient states slightly decreased sensation in left lower extremity compared to right lower extremity.   ED Results / Procedures /  Treatments   RADIOLOGY  Patient's MRI shows spondylosis of the lumbar spine.  Postoperative changes.  Not significantly changed from CT myelogram 02/04/2023.  Patient's MRI shows moderate findings throughout the lumbar spine although there does not appear to be any significant spinal canal stenosis or any other significant change from prior imaging.   MEDICATIONS ORDERED IN ED: Medications - No data to display   IMPRESSION / MDM / ASSESSMENT AND PLAN / ED COURSE  I reviewed the triage vital signs and the nursing notes.  Patient's presentation is most consistent with acute presentation with potential threat to life or bodily function.  Patient presents the emergency department for worsening lower back pain with shooting pains into the legs and occasionally numbness sensation in the legs.  States an episode of urinary incontinence last night when the back pain flared up.  Given the patient's history this could very likely just be acute on chronic back pain versus fibromyalgia however given her description with the incontinence we will obtain MR imaging of the lower back to rule out cauda equina.  Patient  agreeable to plan of care.  Patient is requesting a referral to a new neurosurgeon we will refer to Dr. Marcell Barlow for further evaluation.  MRI continues to show moderate findings especially foraminal stenosis throughout however there does not appear to be any significant canal stenosis and it states largely unchanged from CT myelogram in June 2024.  Suspect much of the patient's findings are likely chronic in nature.  Will refer to neurosurgery for further workup and treatment.  Patient agreeable to plan.  FINAL CLINICAL IMPRESSION(S) / ED DIAGNOSES   Acute on chronic low back pain   Note:  This document was prepared using Dragon voice recognition software and may include unintentional dictation errors.   Minna Antis, MD 07/27/23 1929

## 2023-07-27 NOTE — Discharge Instructions (Addendum)
Please call the number provided for neurosurgery to arrange a follow-up appointment as soon as possible.  Return to the emergency department for any increased weakness/numbness or for any other symptom concerning to yourself.

## 2023-07-30 DIAGNOSIS — M542 Cervicalgia: Secondary | ICD-10-CM | POA: Diagnosis not present

## 2023-07-30 DIAGNOSIS — L4050 Arthropathic psoriasis, unspecified: Secondary | ICD-10-CM | POA: Diagnosis not present

## 2023-07-30 DIAGNOSIS — R519 Headache, unspecified: Secondary | ICD-10-CM | POA: Diagnosis not present

## 2023-07-30 DIAGNOSIS — M5412 Radiculopathy, cervical region: Secondary | ICD-10-CM | POA: Diagnosis not present

## 2023-07-30 DIAGNOSIS — J029 Acute pharyngitis, unspecified: Secondary | ICD-10-CM | POA: Diagnosis not present

## 2023-07-30 DIAGNOSIS — K1379 Other lesions of oral mucosa: Secondary | ICD-10-CM | POA: Diagnosis not present

## 2023-07-30 DIAGNOSIS — B3731 Acute candidiasis of vulva and vagina: Secondary | ICD-10-CM | POA: Diagnosis not present

## 2023-08-05 DIAGNOSIS — M5412 Radiculopathy, cervical region: Secondary | ICD-10-CM | POA: Diagnosis not present

## 2023-08-18 ENCOUNTER — Inpatient Hospital Stay: Payer: Medicare HMO

## 2023-08-18 ENCOUNTER — Inpatient Hospital Stay: Payer: Medicare HMO | Admitting: Internal Medicine

## 2023-08-25 ENCOUNTER — Telehealth (INDEPENDENT_AMBULATORY_CARE_PROVIDER_SITE_OTHER): Payer: Self-pay | Admitting: Nurse Practitioner

## 2023-08-25 DIAGNOSIS — E119 Type 2 diabetes mellitus without complications: Secondary | ICD-10-CM | POA: Diagnosis not present

## 2023-08-25 NOTE — Telephone Encounter (Signed)
After 4 weeks of daily exercise, elevation, and compression patient lymphedema and hyperpigmentation has not improved. Recommendation of pump. Measurements: Left ankle  32.5cm (7/8);  32.9cm(8/6) Right ankle  36.3cm(7/8)  36.6cm(8/6) Left calf      46.3cm(7/8);  46.5cm(8/6) Right calf         49.0cm(7/8);  49.1cm(8/6)

## 2023-09-02 ENCOUNTER — Encounter (INDEPENDENT_AMBULATORY_CARE_PROVIDER_SITE_OTHER): Payer: Self-pay

## 2023-09-02 ENCOUNTER — Ambulatory Visit (INDEPENDENT_AMBULATORY_CARE_PROVIDER_SITE_OTHER): Payer: Medicare HMO | Admitting: Nurse Practitioner

## 2023-09-09 ENCOUNTER — Inpatient Hospital Stay: Payer: Medicare HMO | Attending: Internal Medicine

## 2023-09-09 ENCOUNTER — Inpatient Hospital Stay (HOSPITAL_BASED_OUTPATIENT_CLINIC_OR_DEPARTMENT_OTHER): Payer: Medicare HMO | Admitting: Internal Medicine

## 2023-09-09 ENCOUNTER — Encounter: Payer: Self-pay | Admitting: Internal Medicine

## 2023-09-09 VITALS — BP 132/80 | HR 78 | Temp 98.6°F | Ht 71.0 in | Wt 254.0 lb

## 2023-09-09 DIAGNOSIS — D708 Other neutropenia: Secondary | ICD-10-CM | POA: Diagnosis not present

## 2023-09-09 DIAGNOSIS — R7303 Prediabetes: Secondary | ICD-10-CM | POA: Diagnosis not present

## 2023-09-09 DIAGNOSIS — Z87891 Personal history of nicotine dependence: Secondary | ICD-10-CM | POA: Diagnosis not present

## 2023-09-09 DIAGNOSIS — I10 Essential (primary) hypertension: Secondary | ICD-10-CM | POA: Diagnosis not present

## 2023-09-09 DIAGNOSIS — E0500 Thyrotoxicosis with diffuse goiter without thyrotoxic crisis or storm: Secondary | ICD-10-CM | POA: Diagnosis not present

## 2023-09-09 DIAGNOSIS — Z79899 Other long term (current) drug therapy: Secondary | ICD-10-CM | POA: Insufficient documentation

## 2023-09-09 LAB — CBC WITH DIFFERENTIAL (CANCER CENTER ONLY)
Abs Immature Granulocytes: 0.01 10*3/uL (ref 0.00–0.07)
Basophils Absolute: 0.1 10*3/uL (ref 0.0–0.1)
Basophils Relative: 2 %
Eosinophils Absolute: 0.1 10*3/uL (ref 0.0–0.5)
Eosinophils Relative: 5 %
HCT: 38.2 % (ref 36.0–46.0)
Hemoglobin: 12.6 g/dL (ref 12.0–15.0)
Immature Granulocytes: 0 %
Lymphocytes Relative: 40 %
Lymphs Abs: 1.2 10*3/uL (ref 0.7–4.0)
MCH: 28.1 pg (ref 26.0–34.0)
MCHC: 33 g/dL (ref 30.0–36.0)
MCV: 85.1 fL (ref 80.0–100.0)
Monocytes Absolute: 0.4 10*3/uL (ref 0.1–1.0)
Monocytes Relative: 15 %
Neutro Abs: 1.2 10*3/uL — ABNORMAL LOW (ref 1.7–7.7)
Neutrophils Relative %: 38 %
Platelet Count: 227 10*3/uL (ref 150–400)
RBC: 4.49 MIL/uL (ref 3.87–5.11)
RDW: 13.2 % (ref 11.5–15.5)
WBC Count: 3 10*3/uL — ABNORMAL LOW (ref 4.0–10.5)
nRBC: 0 % (ref 0.0–0.2)

## 2023-09-09 LAB — CMP (CANCER CENTER ONLY)
ALT: 23 U/L (ref 0–44)
AST: 21 U/L (ref 15–41)
Albumin: 4.5 g/dL (ref 3.5–5.0)
Alkaline Phosphatase: 88 U/L (ref 38–126)
Anion gap: 6 (ref 5–15)
BUN: 8 mg/dL (ref 8–23)
CO2: 26 mmol/L (ref 22–32)
Calcium: 9.7 mg/dL (ref 8.9–10.3)
Chloride: 99 mmol/L (ref 98–111)
Creatinine: 0.67 mg/dL (ref 0.44–1.00)
GFR, Estimated: >60 mL/min (ref >60)
Glucose, Bld: 95 mg/dL (ref 70–99)
Potassium: 4.4 mmol/L (ref 3.5–5.1)
Sodium: 131 mmol/L — ABNORMAL LOW (ref 135–145)
Total Bilirubin: 0.8 mg/dL (ref 0.0–1.2)
Total Protein: 7 g/dL (ref 6.5–8.1)

## 2023-09-09 NOTE — Assessment & Plan Note (Addendum)
#  Leukopenia- neutropenia/mild intermittent anemia [MAY 2024- [facility]- 0.94] normal /platelets.  June 2024 white count 2.4 ANC of 2; lymphopenia 0.5. negative for LDH;B12; folic acid ; HIV, Hep C; Hep B.  June 2024- US  Abdomen- Increased hepatic parenchymal echogenicity suggestive of steatosis. No splenomegaly.   # Patient is asymptomatic-benign ethnic neutropenia/lympopenia [most likely benign] Vs. autoimmune. As patient is asymptomatic I think is reasonable to hold off further workup like bone marrow biopsy for now.  Again will reevaluate again in 6 months.  # June 2024- US  Abdomen- Increased hepatic parenchymal echogenicity suggestive of steatosis/fattty liver; discussed re: risk of cirrhosis.  on Monjauro/ defer to PCP. Discussed re: weight loss.   # Autoimmune: Psoriatic arthrtitis/ fibromyalgia/Hyperthyroidism [on Cimzia  q 13m]- ? Cause of leucopenia; monitor for now.   # DISPOSITION: # follow up in 6 month MD;labs-cbc/cmp -Dr.B

## 2023-09-09 NOTE — Progress Notes (Signed)
 Was seen in the ED 07/27/23, back pain. Was seen at [facility] for neck pain, MRI.

## 2023-09-09 NOTE — Progress Notes (Signed)
 [facility] CONSULT NOTE  Patient Care Team: Lenon Layman ORN, MD as PCP - General (Internal Medicine) Rennie Cindy SAUNDERS, MD as Consulting Physician (Oncology)  # CHIEF COMPLAINTS/PURPOSE OF CONSULTATION: Leucopenia  # LEUCOPENIA/NEUTROPENIA- ANC; Hb; platelets; CT Ab/US ; Hepatitis/HIV; Alcohol -none.   #  Oncology History   No history exists.   HISTORY OF PRESENTING ILLNESS: Patient ambulating-with assistance.  Alone.  Christine Higgins 68 y.o.  female with history of psoriatic arthritis fibromyalgia chronic neuropathy chronic pain is here for follow-up of her neutropenia.  Pt here for F/U neutropenia. No fevers or infections needing antibiotics.   Pt ambulatory with walker. Appetite is good. Energy is fair.    Review of Systems  Constitutional:  Positive for malaise/fatigue. Negative for chills, diaphoresis, fever and weight loss.  HENT:  Negative for nosebleeds and sore throat.   Eyes:  Negative for double vision.  Respiratory:  Negative for cough, hemoptysis, sputum production, shortness of breath and wheezing.   Cardiovascular:  Negative for chest pain, palpitations, orthopnea and leg swelling.  Gastrointestinal:  Negative for abdominal pain, blood in stool, constipation, diarrhea, heartburn, melena, nausea and vomiting.  Genitourinary:  Negative for dysuria, frequency and urgency.  Musculoskeletal:  Positive for back pain and joint pain.  Skin: Negative.  Negative for itching and rash.  Neurological:  Negative for dizziness, tingling, focal weakness, weakness and headaches.  Endo/Heme/Allergies:  Does not bruise/bleed easily.  Psychiatric/Behavioral:  Negative for depression. The patient is not nervous/anxious and does not have insomnia.      MEDICAL HISTORY:  Past Medical History:  Diagnosis Date   Anxiety    Asthma    Breast mass    LEFT x 3 months per pt and palpated by physician   Cervical disc disease    Chronic pain syndrome    COPD  (chronic obstructive pulmonary disease) (HCC)    Degenerative disc disease, lumbar    osteoarthritis   Depression    Diabetes mellitus without complication (HCC)    Fibromyalgia    Fibromyalgia    GERD (gastroesophageal reflux disease)    Glaucoma    Graves disease    Headache    migraines   Hemorrhoids    History of hiatal hernia    Hyperlipidemia    Hypertension    Hyperthyroidism    Lumbar disc disease    Obesity    Pre-diabetes    Psoriatic arthritis (HCC)    Thyroid  disease     SURGICAL HISTORY: Past Surgical History:  Procedure Laterality Date   BACK SURGERY     sumbar   CARDIAC CATHETERIZATION  2006   carpel tunn Right    carpel tunnel Left    CATARACT EXTRACTION Bilateral 2022   CESAREAN SECTION     COLONOSCOPY     COLONOSCOPY WITH PROPOFOL  N/A 10/20/2020   Procedure: COLONOSCOPY WITH PROPOFOL ;  Surgeon: Maryruth Ole DASEN, MD;  Location: [facility];  Service: Endoscopy;  Laterality: N/A;   DILATATION & CURETTAGE/HYSTEROSCOPY WITH MYOSURE N/A 10/24/2022   Procedure: DILATATION & CURETTAGE/HYSTEROSCOPY;  Surgeon: Schermerhorn, Debby PARAS, MD;  Location: [facility];  Service: Gynecology;  Laterality: N/A;   ESOPHAGOGASTRODUODENOSCOPY (EGD) WITH PROPOFOL  N/A 06/26/2017   Procedure: ESOPHAGOGASTRODUODENOSCOPY (EGD) WITH PROPOFOL ;  Surgeon: Gaylyn Gladis PENNER, MD;  Location: [facility] END[facility]COPY;  Service: Endoscopy;  Laterality: N/A;   EXCISION MORTON'S NEUROMA Left 02/05/2017   Procedure: EXCISION MORTON'S NEUROMA;  Surgeon: Ashley Soulier, DPM;  Location: [facility];  Service: Podiatry;  Laterality: Left;  iva  with local   FRACTURE SURGERY  1969   Left Elbow   HAND SURGERY Right    scar tissue removal   JOINT REPLACEMENT     right hip arthroplasty 08/25/15   KNEE ARTHROPLASTY Left 08/11/2017   Procedure: COMPUTER ASSISTED TOTAL KNEE ARTHROPLASTY;  Surgeon: Mardee Lynwood SQUIBB, MD;  Location: [facility];  Service: Orthopedics;  Laterality: Left;   KNEE  ARTHROPLASTY Right 02/05/2021   Procedure: COMPUTER ASSISTED TOTAL KNEE ARTHROPLASTY;  Surgeon: Mardee Lynwood SQUIBB, MD;  Location: [facility];  Service: Orthopedics;  Laterality: Right;   KNEE ARTHROSCOPY Right 07/02/2018   Procedure: ARTHROSCOPY KNEE, Medial and Lateral  Chondroplasty;  Surgeon: Mardee Lynwood SQUIBB, MD;  Location: [facility];  Service: Orthopedics;  Laterality: Right;   LUMBAR LAMINECTOMY/DECOMPRESSION MICRODISCECTOMY N/A 06/19/2022   Procedure: EXPLORE LUMBAR WOUND FOR EVACUATION OF HEMATOMA;  Surgeon: Mavis Purchase, MD;  Location: [facility] OR;  Service: Neurosurgery;  Laterality: N/A;   NECK SURGERY     disk implant   REVERSE SHOULDER ARTHROPLASTY Left 06/24/2019   Procedure: REVERSE SHOULDER ARTHROPLASTY;  Surgeon: Edie Norleen PARAS, MD;  Location: [facility];  Service: Orthopedics;  Laterality: Left;   SHOULDER SURGERY Right    spur removal   SPINAL CORD STIMULATOR REMOVAL N/A 04/09/2021   Procedure: REMOVAL SPINAL CORD STIMULATOR & PULSE GENERATOR;  Surgeon: Bluford Standing, MD;  Location: [facility];  Service: Neurosurgery;  Laterality: N/A;  2nd case   THORACIC LAMINECTOMY FOR SPINAL CORD STIMULATOR N/A 08/07/2020   Procedure: THORACIC SPINAL CORD STIMULATOR VIA LAMINECTOMY, PULSE GENERATOR;  Surgeon: Bluford Standing, MD;  Location: [facility];  Service: Neurosurgery;  Laterality: N/A;   TOTAL HIP ARTHROPLASTY Right    TUBAL LIGATION      SOCIAL HISTORY: Social History   Socioeconomic History   Marital status: Single    Spouse name: Not on file   Number of children: Not on file   Years of education: Not on file   Highest education level: Not on file  Occupational History   Not on file  Tobacco Use   Smoking status: Former    Current packs/day: 0.00    Types: Cigarettes    Quit date: 68    Years since quitting: 31.0   Smokeless tobacco: Never  Vaping Use   Vaping status: Never Used  Substance and Sexual Activity   Alcohol  use: No    Alcohol /week: 0.0 standard drinks of alcohol     Drug use: Not Currently    Types: Crack cocaine, Marijuana    Comment: 1991   Sexual activity: Not Currently    Birth control/protection: None  Other Topics Concern   Not on file  Social History Narrative   Live in assisted living   Social Drivers of Health   Financial Resource Strain: Not on file  Food Insecurity: Food Insecurity Present (02/05/2023)   Hunger Vital Sign    Worried About Running Out of Food in the Last Year: Sometimes true    Ran Out of Food in the Last Year: Sometimes true  Transportation Needs: No Transportation Needs (02/05/2023)   PRAPARE - Administrator, Civil Service (Medical): No    Lack of Transportation (Non-Medical): No  Physical Activity: Not on file  Stress: Not on file  Social Connections: Not on file  Intimate Partner Violence: Not At Risk (02/05/2023)   Humiliation, Afraid, Rape, and Kick questionnaire    Fear of Current or Ex-Partner: No    Emotionally Abused: No    Physically Abused:  No    Sexually Abused: No    FAMILY HISTORY: Family History  Problem Relation Age of Onset   Cancer Mother    Heart disease Father    Breast cancer Neg Hx     ALLERGIES:  is allergic to cephalexin , ibuprofen, potassium chloride , shellfish allergy , and aspirin.  MEDICATIONS:  Current Outpatient Medications  Medication Sig Dispense Refill   acetaminophen  (TYLENOL ) 325 MG tablet Take 650 mg by mouth every 4 (four) hours as needed for mild pain, fever or headache.     albuterol  (VENTOLIN  HFA) 108 (90 Base) MCG/ACT inhaler Inhale 1 puff into the lungs every 4 (four) hours as needed for shortness of breath or wheezing.     amLODipine (NORVASC) 5 MG tablet Take 5 mg by mouth daily.     atorvastatin  (LIPITOR) 10 MG tablet Take 10 mg by mouth daily after supper.      budesonide-formoterol  (SYMBICORT) 160-4.5 MCG/ACT inhaler Inhale 2 puffs into the lungs 2 (two) times daily.     calcium  carbonate (OS-CAL) 600 MG TABS tablet Take 600 mg by mouth every  other day.     celecoxib  (CELEBREX ) 200 MG capsule Take 200 mg by mouth 2 (two) times daily.     Cholecalciferol  (VITAMIN D3 SUPER STRENGTH) 50 MCG (2000 UT) TABS Take 3,000 Units by mouth daily.     clobetasol  (TEMOVATE ) 0.05 % external solution APPLY 1-2 DROPS TO AFFECTED AREA SCALP AS NEEDED FOR ITCH. AVOID FACE, GROIN, AXILLA. 50 mL 1   clobetasol  cream (TEMOVATE ) 0.05 % Apply topically bid to aa of rash at left breast for 2 - 4 weeks prn. Avoid applying to face, groin, and axilla. Use as directed. 30 g 1   clonazePAM  (KLONOPIN ) 0.5 MG tablet Take 0.5 mg by mouth at bedtime as needed for anxiety.     DULoxetine  (CYMBALTA ) 30 MG capsule Take 30 mg by mouth at bedtime.     DULoxetine  (CYMBALTA ) 60 MG capsule Take 60 mg by mouth daily.     esomeprazole (NEXIUM) 40 MG capsule Take 40 mg by mouth 2 (two) times daily.      Fluocinolone  Acetonide Body 0.01 % OIL Apply to scalp twice daily as needed for itch. 118.28 mL 11   fluticasone  (FLONASE ) 50 MCG/ACT nasal spray Place 2 sprays into both nostrils daily.     gabapentin  (NEURONTIN ) 600 MG tablet Take 300 mg by mouth in the morning, at noon, and at bedtime.     HYDROcodone -acetaminophen  (NORCO/VICODIN) 5-325 MG tablet Take 1 tablet by mouth every 6 (six) hours as needed for moderate pain.     ibandronate  (BONIVA ) 150 MG tablet Take 150 mg by mouth every 30 (thirty) days.     ketoconazole  (NIZORAL ) 2 % cream Apply to affected areas of itchy rash under breast, body folds qd/bid qhs. 30 g 11   ketoconazole  (NIZORAL ) 2 % shampoo Massage into scalp and let sit 10-15 mins before rinsing. Use 1x/week. 120 mL 11   levocetirizine (XYZAL) 5 MG tablet Take 5 mg by mouth every evening.     linaclotide  (LINZESS ) 290 MCG CAPS capsule Take 290 mcg by mouth daily.     losartan  (COZAAR ) 100 MG tablet Take 100 mg by mouth daily.     methimazole  (TAPAZOLE ) 5 MG tablet Take 5 mg by mouth daily.     methocarbamol  1000 MG TABS Take 1,000 mg by mouth 3 (three) times  daily. (Patient taking differently: Take 1,000 mg by mouth in the morning and at bedtime.)  MOUNJARO 5 MG/0.5ML Pen Inject 5 mg into the skin once a week.     Multiple Vitamin (MULTIVITAMIN WITH MINERALS) TABS tablet Take 1 tablet by mouth daily.     Naloxone  HCl (NARCAN  NA) Place 4 mg into the nose 3 (three) times daily as needed.     potassium chloride  (KLOR-CON ) 10 MEQ tablet Take 10 mEq by mouth daily.     pregabalin (LYRICA) 50 MG capsule Take 50 mg by mouth 2 (two) times daily.     propranolol  (INDERAL ) 80 MG tablet Take 80 mg by mouth 2 (two) times daily.     sucralfate  (CARAFATE ) 1 g tablet Take 1 g by mouth 4 (four) times daily -  before meals and at bedtime.     terbinafine  (LAMISIL ) 250 MG tablet Take 1 tablet (250 mg total) by mouth daily. Take with food. 30 tablet 2   torsemide  (DEMADEX ) 10 MG tablet Take 10 mg by mouth daily.     traZODone  (DESYREL ) 150 MG tablet Take 75 mg by mouth at bedtime as needed for sleep.      No current facility-administered medications for this visit.      PHYSICAL EXAMINATION:  Vitals:   09/09/23 1511  BP: 132/80  Pulse: 78  Temp: 98.6 F (37 C)  SpO2: 100%    Filed Weights   09/09/23 1511  Weight: 254 lb (115.2 kg)     Physical Exam Vitals and nursing note reviewed.  HENT:     Head: Normocephalic and atraumatic.     Mouth/Throat:     Pharynx: Oropharynx is clear.  Eyes:     Extraocular Movements: Extraocular movements intact.     Pupils: Pupils are equal, round, and reactive to light.  Cardiovascular:     Rate and Rhythm: Normal rate and regular rhythm.  Pulmonary:     Comments: Decreased breath sounds bilaterally.  Abdominal:     Palpations: Abdomen is soft.  Musculoskeletal:        General: Normal range of motion.     Cervical back: Normal range of motion.  Skin:    General: Skin is warm.  Neurological:     General: No focal deficit present.     Mental Status: She is alert and oriented to person, place, and time.   Psychiatric:        Behavior: Behavior normal.        Judgment: Judgment normal.      LABORATORY DATA:  I have reviewed the data as listed Lab Results  Component Value Date   WBC 3.0 (L) 09/09/2023   HGB 12.6 09/09/2023   HCT 38.2 09/09/2023   MCV 85.1 09/09/2023   PLT 227 09/09/2023   Recent Labs    09/19/22 1728 10/22/22 1310 02/05/23 1447 09/09/23 1502  NA 133* 131* 135 131*  K 3.8 3.3* 4.0 4.4  CL 100 95* 100 99  CO2 24 24 26 26   GLUCOSE 140* 155* 117* 95  BUN 9 10 7* 8  CREATININE 0.71 0.78 0.67 0.67  CALCIUM  9.6 9.1 10.2 9.7  GFRNONAA >60 >60 >60 >60  PROT 7.8  --  8.3* 7.0  ALBUMIN 4.7  --  5.1* 4.5  AST 21  --  23 21  ALT 20  --  20 23  ALKPHOS 97  --  89 88  BILITOT 0.7  --  0.5 0.8    RADIOGRAPHIC STUDIES: I have personally reviewed the radiological images as listed and agreed with the findings in the  report. No results found.  ASSESSMENT & PLAN:   Other neutropenia (HCC) #Leukopenia- neutropenia/mild intermittent anemia [MAY 2024- [facility]- 0.94] normal /platelets.  June 2024 white count 2.4 ANC of 2; lymphopenia 0.5. negative for LDH;B12; folic acid ; HIV, Hep C; Hep B.  June 2024- US  Abdomen- Increased hepatic parenchymal echogenicity suggestive of steatosis. No splenomegaly.   # Patient is asymptomatic-benign ethnic neutropenia/lympopenia [most likely benign] Vs. autoimmune. As patient is asymptomatic I think is reasonable to hold off further workup like bone marrow biopsy for now.  Again will reevaluate again in 6 months.  # June 2024- US  Abdomen- Increased hepatic parenchymal echogenicity suggestive of steatosis/fattty liver; discussed re: risk of cirrhosis.  on Monjauro/ defer to PCP. Discussed re: weight loss.   # Autoimmune: Psoriatic arthrtitis/ fibromyalgia/Hyperthyroidism [on Cimzia  q 79m]- ? Cause of leucopenia; monitor for now.   # DISPOSITION: # follow up in 6 month MD;labs-cbc/cmp -Dr.B   All questions were answered. The patient  knows to call the clinic with any problems, questions or concerns.    Cindy JONELLE Joe, MD 09/09/2023 3:44 PM

## 2023-09-18 DIAGNOSIS — E119 Type 2 diabetes mellitus without complications: Secondary | ICD-10-CM | POA: Diagnosis not present

## 2023-09-18 DIAGNOSIS — I10 Essential (primary) hypertension: Secondary | ICD-10-CM | POA: Diagnosis not present

## 2023-09-18 DIAGNOSIS — E042 Nontoxic multinodular goiter: Secondary | ICD-10-CM | POA: Diagnosis not present

## 2023-09-18 DIAGNOSIS — I700 Atherosclerosis of aorta: Secondary | ICD-10-CM | POA: Diagnosis not present

## 2023-09-18 DIAGNOSIS — F325 Major depressive disorder, single episode, in full remission: Secondary | ICD-10-CM | POA: Diagnosis not present

## 2023-09-18 DIAGNOSIS — L4050 Arthropathic psoriasis, unspecified: Secondary | ICD-10-CM | POA: Diagnosis not present

## 2023-09-18 DIAGNOSIS — E0500 Thyrotoxicosis with diffuse goiter without thyrotoxic crisis or storm: Secondary | ICD-10-CM | POA: Diagnosis not present

## 2023-09-18 DIAGNOSIS — Z6836 Body mass index (BMI) 36.0-36.9, adult: Secondary | ICD-10-CM | POA: Diagnosis not present

## 2023-09-18 DIAGNOSIS — R1032 Left lower quadrant pain: Secondary | ICD-10-CM | POA: Diagnosis not present

## 2023-09-22 ENCOUNTER — Other Ambulatory Visit: Payer: Self-pay | Admitting: Internal Medicine

## 2023-09-22 DIAGNOSIS — R1032 Left lower quadrant pain: Secondary | ICD-10-CM

## 2023-10-01 ENCOUNTER — Ambulatory Visit
Admission: RE | Admit: 2023-10-01 | Discharge: 2023-10-01 | Disposition: A | Discharge status: Home / Self Care | Payer: Medicare HMO | Source: Ambulatory Visit | Attending: Internal Medicine | Admitting: Internal Medicine

## 2023-10-01 DIAGNOSIS — R1084 Generalized abdominal pain: Secondary | ICD-10-CM | POA: Diagnosis not present

## 2023-10-01 DIAGNOSIS — L4050 Arthropathic psoriasis, unspecified: Secondary | ICD-10-CM | POA: Diagnosis not present

## 2023-10-01 DIAGNOSIS — R1032 Left lower quadrant pain: Secondary | ICD-10-CM | POA: Insufficient documentation

## 2023-10-01 MED ORDER — IOHEXOL 300 MG/ML  SOLN
100.0000 mL | Freq: Once | INTRAMUSCULAR | Status: AC | PRN
  Administered 2023-10-01: 100 mL via INTRAVENOUS

## 2023-10-09 DIAGNOSIS — E041 Nontoxic single thyroid nodule: Secondary | ICD-10-CM | POA: Diagnosis not present

## 2023-10-22 DIAGNOSIS — G4486 Cervicogenic headache: Secondary | ICD-10-CM | POA: Diagnosis not present

## 2023-10-22 DIAGNOSIS — M797 Fibromyalgia: Secondary | ICD-10-CM | POA: Diagnosis not present

## 2023-10-22 DIAGNOSIS — E1142 Type 2 diabetes mellitus with diabetic polyneuropathy: Secondary | ICD-10-CM | POA: Diagnosis not present

## 2023-10-22 DIAGNOSIS — M48062 Spinal stenosis, lumbar region with neurogenic claudication: Secondary | ICD-10-CM | POA: Diagnosis not present

## 2023-10-22 DIAGNOSIS — G5603 Carpal tunnel syndrome, bilateral upper limbs: Secondary | ICD-10-CM | POA: Diagnosis not present

## 2023-10-23 DIAGNOSIS — M25561 Pain in right knee: Secondary | ICD-10-CM | POA: Diagnosis not present

## 2023-10-23 DIAGNOSIS — E119 Type 2 diabetes mellitus without complications: Secondary | ICD-10-CM | POA: Diagnosis not present

## 2023-10-23 DIAGNOSIS — M25562 Pain in left knee: Secondary | ICD-10-CM | POA: Diagnosis not present

## 2023-10-29 ENCOUNTER — Other Ambulatory Visit: Payer: Self-pay | Admitting: Neurology

## 2023-10-29 ENCOUNTER — Other Ambulatory Visit: Payer: Self-pay | Admitting: Family Medicine

## 2023-10-29 DIAGNOSIS — M797 Fibromyalgia: Secondary | ICD-10-CM | POA: Diagnosis not present

## 2023-10-29 DIAGNOSIS — R519 Headache, unspecified: Secondary | ICD-10-CM

## 2023-10-29 DIAGNOSIS — L409 Psoriasis, unspecified: Secondary | ICD-10-CM | POA: Diagnosis not present

## 2023-10-29 DIAGNOSIS — Z79899 Other long term (current) drug therapy: Secondary | ICD-10-CM | POA: Diagnosis not present

## 2023-10-29 DIAGNOSIS — G8929 Other chronic pain: Secondary | ICD-10-CM

## 2023-10-29 DIAGNOSIS — M150 Primary generalized (osteo)arthritis: Secondary | ICD-10-CM | POA: Diagnosis not present

## 2023-10-29 DIAGNOSIS — L4050 Arthropathic psoriasis, unspecified: Secondary | ICD-10-CM | POA: Diagnosis not present

## 2023-11-04 DIAGNOSIS — Z96651 Presence of right artificial knee joint: Secondary | ICD-10-CM | POA: Diagnosis not present

## 2023-11-04 DIAGNOSIS — M25561 Pain in right knee: Secondary | ICD-10-CM | POA: Diagnosis not present

## 2023-11-04 DIAGNOSIS — M25562 Pain in left knee: Secondary | ICD-10-CM | POA: Diagnosis not present

## 2023-11-04 DIAGNOSIS — Z96652 Presence of left artificial knee joint: Secondary | ICD-10-CM | POA: Diagnosis not present

## 2023-11-06 ENCOUNTER — Ambulatory Visit
Payer: Medicare HMO | Attending: Student in an Organized Health Care Education/Training Program | Admitting: Student in an Organized Health Care Education/Training Program

## 2023-11-06 ENCOUNTER — Encounter: Payer: Self-pay | Admitting: Student in an Organized Health Care Education/Training Program

## 2023-11-06 VITALS — BP 126/74 | HR 83 | Temp 97.5°F | Resp 16 | Ht 70.0 in | Wt 243.0 lb

## 2023-11-06 DIAGNOSIS — M461 Sacroiliitis, not elsewhere classified: Secondary | ICD-10-CM | POA: Diagnosis present

## 2023-11-06 DIAGNOSIS — Z9889 Other specified postprocedural states: Secondary | ICD-10-CM | POA: Insufficient documentation

## 2023-11-06 DIAGNOSIS — M5416 Radiculopathy, lumbar region: Secondary | ICD-10-CM | POA: Insufficient documentation

## 2023-11-06 DIAGNOSIS — G5703 Lesion of sciatic nerve, bilateral lower limbs: Secondary | ICD-10-CM | POA: Insufficient documentation

## 2023-11-06 DIAGNOSIS — M533 Sacrococcygeal disorders, not elsewhere classified: Secondary | ICD-10-CM | POA: Insufficient documentation

## 2023-11-06 DIAGNOSIS — G894 Chronic pain syndrome: Secondary | ICD-10-CM | POA: Insufficient documentation

## 2023-11-06 NOTE — Progress Notes (Signed)
 PROVIDER NOTE: Information contained herein reflects review and annotations entered in association with encounter. Interpretation of such information and data should be left to medically-trained personnel. Information provided to patient can be located elsewhere in the medical record under "Patient Instructions". Document created using STT-dictation technology, any transcriptional errors that may result from process are unintentional.    Patient: Christine Higgins  Service Category: E/M  Provider: Edward Jolly, MD  DOB: Feb 24, 1956  DOS: 11/06/2023  Referring Provider: Lauro Regulus, MD  MRN: [id number]  Specialty: Interventional Pain Management  PCP: Christine Regulus, MD  Type: Established Patient  Setting: Ambulatory outpatient    Location: Office  Delivery: Face-to-face     HPI  Christine Higgins, a 68 y.o. year old female, is here today because of her Lumbar radiculopathy [M54.16]. Christine Higgins primary complain today is Back Pain  Pertinent problems: Christine Higgins has Failed back surgical syndrome; DDD (degenerative disc disease), lumbosacral; Severe obesity (BMI 35.0-35.9 with comorbidity) (HCC); Primary osteoarthritis of right knee; DM II (diabetes mellitus, type II), controlled (HCC); Chronic pain syndrome; Fibromyalgia; and Lumbar radiculopathy on their pertinent problem list. Pain Assessment: Severity of Chronic pain is reported as a 8 /10. Location: Back Lower, Right, Left (right lower back worse than left)/ . Onset: More than a month ago. Quality: Stabbing, Sharp, Throbbing, Burning, Aching. Timing: Constant. Modifying factor(s): rest, laying on stomach. Vitals:  height is 5\' 10"  (1.778 m) and weight is 243 lb (110.2 kg). Her temperature is 97.5 F (36.4 C) (abnormal). Her blood pressure is 126/74 and her pulse is 83. Her respiration is 16 and oxygen saturation is 99%.  BMI: Estimated body mass index is 34.87 kg/m as calculated from the following:   Height as of this encounter: 5\' 10"   (1.778 m).   Weight as of this encounter: 243 lb (110.2 kg). Last encounter: 05/27/2023. Last procedure: 04/07/2023.  Reason for encounter: patient-requested evaluation.  Discussed the use of AI scribe software for clinical note transcription with the patient, who gave verbal consent to proceed.  History of Present Illness   The patient presents with persistent lower back pain and a palpable knot overlying the SI joints.  She experiences persistent lower back pain, particularly on both sides, with a notable 'big knot' overlying the SI joints. The pain worsens with prolonged standing and radiates down her leg, suggestive of sciatica. She is not currently on any blood thinners.  She has a history of back surgery in October of the year before last, which was complicated by a blood clot in her spine, necessitating a return to surgery. She was hospitalized for an additional week and then spent approximately five and a half to six weeks in rehabilitation at [facility].  In December following her surgery, she experienced a fall due to a broken chair, resulting in further back injury and requiring additional therapy.  She previously received a pain management injection from a clinic in [place] but found the experience unsatisfactory due to the staff's demeanor. She attempted to return for care here in October of last year but faced insurance coverage issues, possibly due to her name still being associated with the previous clinic's account.  Her C-spine pain has improved with exercise and therapy, but the lower back pain persists and is described as worse than before. No significant pain radiating down her legs unless standing for a long time.       ROS  Constitutional: Denies any fever or chills Gastrointestinal: No reported hemesis, hematochezia,  vomiting, or acute GI distress Musculoskeletal: B/L SIJ pain Neurological:  bilateral leg pain, numbness/tingling with prolonged  standing/walking  Medication Review  Cholecalciferol, DULoxetine, Fluocinolone Acetonide Body, HYDROcodone-acetaminophen, Methocarbamol, Naloxone HCl, acetaminophen, albuterol, amLODipine, atorvastatin, budesonide-formoterol, calcium carbonate, celecoxib, clobetasol, clobetasol cream, clonazePAM, esomeprazole, fluticasone, gabapentin, ibandronate, ketoconazole, levocetirizine, linaclotide, losartan, methimazole, multivitamin with minerals, potassium chloride, pregabalin, propranolol, sucralfate, terbinafine, tirzepatide, torsemide, and traZODone  History Review  Allergy: Christine Higgins is allergic to cephalexin, ibuprofen, potassium chloride, shellfish allergy, and aspirin. Drug: Christine Higgins  reports that she does not currently use drugs after having used the following drugs: "Crack" cocaine and Marijuana. Alcohol:  reports no history of alcohol use. Tobacco:  reports that she quit smoking about 31 years ago. Her smoking use included cigarettes. She has never used smokeless tobacco. Social: Christine Higgins  reports that she quit smoking about 31 years ago. Her smoking use included cigarettes. She has never used smokeless tobacco. She reports that she does not currently use drugs after having used the following drugs: "Crack" cocaine and Marijuana. She reports that she does not drink alcohol. Medical:  has a past medical history of Anxiety, Asthma, Breast mass, Cervical disc disease, Chronic pain syndrome, COPD (chronic obstructive pulmonary disease) (HCC), Degenerative disc disease, lumbar, Depression, Diabetes mellitus without complication (HCC), Fibromyalgia, Fibromyalgia, GERD (gastroesophageal reflux disease), Glaucoma, Graves disease, Headache, Hemorrhoids, History of hiatal hernia, Hyperlipidemia, Hypertension, Hyperthyroidism, Lumbar disc disease, Obesity, Pre-diabetes, Psoriatic arthritis (HCC), and Thyroid disease. Surgical: Christine Higgins  has a past surgical history that includes carpel tunn (Right); Hand  surgery (Right); carpel tunnel (Left); Cesarean section; Shoulder surgery (Right); Back surgery; Neck surgery; Total hip arthroplasty (Right); Excision Morton's neuroma (Left, 02/05/2017); Colonoscopy; Joint replacement; Esophagogastroduodenoscopy (egd) with propofol (N/A, 06/26/2017); Knee Arthroplasty (Left, 08/11/2017); Knee arthroscopy (Right, 07/02/2018); Reverse shoulder arthroplasty (Left, 06/24/2019); Thoracic laminectomy for spinal cord stimulator (N/A, 08/07/2020); Colonoscopy with propofol (N/A, 10/20/2020); Cardiac catheterization (2006); Tubal ligation; Knee Arthroplasty (Right, 02/05/2021); Spinal cord stimulator removal (N/A, 04/09/2021); Cataract extraction (Bilateral, 2022); Fracture surgery (1969); Lumbar laminectomy/decompression microdiscectomy (N/A, 06/19/2022); and Dilatation & curettage/hysteroscopy with myosure (N/A, 10/24/2022). Family: family history includes Cancer in her mother; Heart disease in her father.  Laboratory Chemistry Profile   Renal Lab Results  Component Value Date   BUN 8 09/09/2023   CREATININE 0.67 09/09/2023   BCR 11 (L) 04/27/2020   GFRAA 83 04/27/2020   GFRNONAA >60 09/09/2023    Hepatic Lab Results  Component Value Date   AST 21 09/09/2023   ALT 23 09/09/2023   ALBUMIN 4.5 09/09/2023   ALKPHOS 88 09/09/2023   HCVAB NON REACTIVE 02/05/2023   LIPASE 27 12/14/2016    Electrolytes Lab Results  Component Value Date   NA 131 (L) 09/09/2023   K 4.4 09/09/2023   CL 99 09/09/2023   CALCIUM 9.7 09/09/2023   MG 1.9 08/27/2014    Bone No results found for: "VD25OH", "VD125OH2TOT", "WU9811BJ4", "NW2956OZ3", "25OHVITD1", "25OHVITD2", "25OHVITD3", "TESTOFREE", "TESTOSTERONE"  Inflammation (CRP: Acute Phase) (ESR: Chronic Phase) Lab Results  Component Value Date   CRP <0.5 02/05/2023   ESRSEDRATE 6 01/25/2021         Note: Above Lab results reviewed.  Recent Imaging Review  CT ABDOMEN PELVIS W CONTRAST CLINICAL DATA:  diffuse abdominal pain  for 2 years. History of C-section. Palpable abnormality on left hip area that appeared 5 months ago and continues to grow. EMR history states left lower quadrant pain.  EXAM: CT ABDOMEN AND PELVIS WITH CONTRAST  TECHNIQUE: Multidetector CT imaging  of the abdomen and pelvis was performed using the standard protocol following bolus administration of intravenous contrast.  RADIATION DOSE REDUCTION: This exam was performed according to the departmental dose-optimization program which includes automated exposure control, adjustment of the mA and/or kV according to patient size and/or use of iterative reconstruction technique.  CONTRAST:  OMNIPAQUE IOHEXOL 300 MG/ML  SOLN  COMPARISON:  02/13/2023 abdominal ultrasound. Most recent CT 07/31/2020  FINDINGS: Lower chest: Clear lung bases. Normal heart size without pericardial or pleural effusion.  Hepatobiliary: Normal liver. Normal gallbladder, without biliary ductal dilatation.  Pancreas: Normal, without mass or ductal dilatation.  Spleen: Normal in size, without focal abnormality.  Adrenals/Urinary Tract: Normal adrenal glands. Beam hardening artifact from lumbar spine fixation degrades evaluation of the posterior abdomen. No gross renal abnormality and no hydronephrosis. Degraded evaluation of the pelvis, secondary to beam hardening artifact from right hip arthroplasty. Grossly normal urinary bladder.  Stomach/Bowel: Normal stomach, without wall thickening. Normal colon, appendix, and terminal ileum. Normal small bowel.  Vascular/Lymphatic: Aortic atherosclerosis. No abdominopelvic adenopathy.  Reproductive: Normal uterus and adnexa.  Other: No significant free fluid. No free intraperitoneal air. No dominant soft tissue mass about the left hip. There is soft tissue density within the left flank subcutaneous fat of 3.7 x 1.3 cm on 33/2 which is new.  Musculoskeletal: Right hip arthroplasty. L3-4 trans pedicle  screw fixation.  IMPRESSION: 1. Multifactorial degradation, including beam hardening artifact from right hip arthroplasty and lumbar spine fixation. 2. Given this limitation, no acute process or explanation for left lower quadrant pain. 3. Soft tissue density in the left flank subcutaneous fat is new and most likely related to prior trauma. No other explanation for "palpable abnormality about the left hip". 4.  Aortic Atherosclerosis (ICD10-I70.0).  Electronically Signed   By: Jeronimo Greaves M.D.   On: 10/13/2023 13:24 CLINICAL DATA:  Provided history: Low back pain, cauda equina syndrome suspected. Incontinence.   EXAM: MRI LUMBAR SPINE WITHOUT CONTRAST   TECHNIQUE: Multiplanar, multisequence MR imaging of the lumbar spine was performed. No intravenous contrast was administered.   COMPARISON:  Lumbar CT myelogram 02/04/2023.   FINDINGS: Segmentation: 5 lumbar vertebrae. The caudal most well-formed intervertebral disc space is designated L5-S1.   Alignment: Dextrocurvature of the lumbar spine. 2 mm T12-L1 grade 1 retrolisthesis.   Vertebrae: Lumbar vertebral body height is maintained. Seventy artifact arising from an interbody device and posterior spinal fusion construct at L3-L4. No significant marrow edema or focal worrisome marrow lesion.   Conus medullaris and cauda equina: Conus extends to the L1 level. No signal abnormality identified within the visualized distal spinal cord.   Paraspinal and other soft tissues: No acute finding within included portions of the abdomen/retroperitoneum. Atrophy of the lumbar paraspinal musculature. Postsurgical changes to the dorsal soft tissues at L3-L4.   Disc levels:   Unless otherwise stated, the level by level findings below have not significantly changed from the prior lumbar CT myelogram of 02/04/2023.   Multilevel disc degeneration at the lumbar and visualized lower thoracic levels, greatest at T11-T12 (moderate),  T12-L1 (mild-to-moderate) and L4-L5 (mild-to-moderate).   T11-T12: This level is imaged in the sagittal plane only. Disc bulge. Superimposed right foraminal/extraforaminal disc protrusion. Facet arthrosis and ligamentum flavum hypertrophy. Moderate spinal canal stenosis (with mild spinal cord flattening). Severe right neural foraminal narrowing.   T12-L1. Disc bulge. Superimposed right foraminal/extraforaminal disc protrusion. Facet arthrosis and ligamentum flavum hypertrophy. Mild spinal canal stenosis. Bilateral neural foraminal narrowing (severe right, moderate left).  L1-L2: Facet arthropathy and ligamentum flavum hypertrophy. No significant disc herniation or stenosis.   L2-L3: Prior right laminectomy. Disc bulge. Facet arthropathy. Ligamentum flavum hypertrophy on the left. Left greater than right subarticular narrowing (with potential to affect the descending left L3 nerve root. Mild narrowing of the central canal. Moderate to severe bilateral neural foraminal narrowing.   L3-L4: Prior post decompression and posterior spinal fusion. No significant spinal canal stenosis. Mild residual bilateral neural foraminal narrowing (greater on the left).   L4-L5: Prior posterior decompression. Disc bulge. Facet arthropathy. Small left facet joint effusion. No significant spinal canal stenosis. Moderate bilateral foraminal narrowing (greater on the right).   L5-S1: Congenitally short pedicles. Disc bulge. Superimposed small central disc protrusion. Facet arthropathy with ligamentum flavum hypertrophy. Mild bilateral subarticular narrowing. The disc protrusion is in close proximity to the descending left S1 nerve root. Mild to moderate central canal stenosis. Moderate bilateral neural foraminal narrowing.   IMPRESSION: 1. Spondylosis at the lumbar and visualized lower thoracic levels, and lumbar spine post-operative changes. Findings as outlined within the body of the report and  not significantly changed from the prior lumbar CT myelogram of 02/04/2023. 2. At T11-T12, there is multifactorial moderate spinal canal stenosis (with mild spinal cord flattening). Severe right neural foraminal narrowing. 3. At L2-L3, there is multifactorial left greater than right subarticular stenosis with potential to affect the descending left L3 nerve root. Mild central canal narrowing and moderate-to-severe bilateral neural foraminal narrowing also present at this level. 4. At L5-S1, a central disc protrusion contributes to mild bilateral subarticular narrowing and multifactorial mild-to-moderate central canal stenosis. The disc protrusion is in close proximity to the descending left S1 nerve root. Moderate bilateral neural foraminal narrowing also present at this level. 5. No more than mild spinal canal narrowing at the remaining levels. 6. Additional sites of foraminal stenosis, greatest bilaterally at T12-L1 (severe right, moderate left) and bilaterally at L4-L5 (moderate). 7. Dextrocurvature of the lumbar spine.      Note: Reviewed        Physical Exam  General appearance: Well nourished, well developed, and well hydrated. In no apparent acute distress Mental status: Alert, oriented x 3 (person, place, & time)       Respiratory: No evidence of acute respiratory distress Eyes: PERLA Vitals: BP 126/74   Pulse 83   Temp (!) 97.5 F (36.4 C)   Resp 16   Ht 5\' 10"  (1.778 m)   Wt 243 lb (110.2 kg)   SpO2 99%   BMI 34.87 kg/m  BMI: Estimated body mass index is 34.87 kg/m as calculated from the following:   Height as of this encounter: 5\' 10"  (1.778 m).   Weight as of this encounter: 243 lb (110.2 kg). Ideal: Ideal body weight: 68.5 kg (151 lb 0.2 oz) Adjusted ideal body weight: 85.2 kg (187 lb 12.9 oz)  Lumbar Spine Area Exam  Skin & Axial Inspection: No masses, redness, or swelling Alignment: Symmetrical Functional ROM: Pain restricted ROM affecting both  sides Stability: No instability detected Muscle Tone/Strength: Functionally intact. No obvious neuro-muscular anomalies detected. Sensory (Neurological): Dermatomal pain pattern and referred from SI joint Palpation: No palpable anomalies       Provocative Tests:  Patrick's Maneuver: (+) for bilateral S-I arthralgia             FABER* test: (+) for bilateral S-I arthralgia             S-I anterior distraction/compression test: (+)   S-I arthralgia/arthropathy S-I  lateral compression test: (+)   S-I arthralgia/arthropathy S-I Thigh-thrust test: (+)   S-I arthralgia/arthropathy S-I Gaenslen's test: (+)   S-I arthralgia/arthropathy *(Flexion, ABduction and External Rotation) Gait & Posture Assessment  Ambulation: Patient came in today in a wheel chair Gait: Limited. Using assistive device to ambulate Posture: Difficulty standing up straight, due to pain  Lower Extremity Exam    Side: Right lower extremity  Side: Left lower extremity  Stability: No instability observed          Stability: No instability observed          Skin & Extremity Inspection: Skin color, temperature, and hair growth are WNL. No peripheral edema or cyanosis. No masses, redness, swelling, asymmetry, or associated skin lesions. No contractures.  Skin & Extremity Inspection: Skin color, temperature, and hair growth are WNL. No peripheral edema or cyanosis. No masses, redness, swelling, asymmetry, or associated skin lesions. No contractures.  Functional ROM: Pain restricted ROM for hip and knee joints          Functional ROM: Pain restricted ROM for hip and knee joints          Muscle Tone/Strength: Functionally intact. No obvious neuro-muscular anomalies detected.  Muscle Tone/Strength: Functionally intact. No obvious neuro-muscular anomalies detected.  Sensory (Neurological): Unimpaired        Sensory (Neurological): Unimpaired        DTR: Patellar: deferred today Achilles: deferred today Plantar: deferred today   DTR: Patellar: deferred today Achilles: deferred today Plantar: deferred today  Palpation: No palpable anomalies  Palpation: No palpable anomalies    Assessment   Diagnosis Status  1. Lumbar radiculopathy   2. History of lumbar laminectomy for spinal cord decompression   3. Sacroiliac joint pain   4. SI joint arthritis (HCC)   5. Chronic pain syndrome   6. Piriformis syndrome of both sides    Controlled Controlled Controlled   Updated Problems: Problem  Sacroiliac Joint Pain  Si Joint Arthritis (Hcc)    Plan of Care  1. Lumbar radiculopathy (Primary) - Caudal Epidural Injection; Future  2. History of lumbar laminectomy for spinal cord decompression - Caudal Epidural Injection; Future  3. Sacroiliac joint pain - SACROILIAC JOINT INJECTION; Future  4. SI joint arthritis (HCC) - SACROILIAC JOINT INJECTION; Future  5. Chronic pain syndrome - Caudal Epidural Injection; Future - SACROILIAC JOINT INJECTION; Future - TRIGGER POINT INJECTION; Future  6. Piriformis syndrome of both sides - TRIGGER POINT INJECTION; Future    Pharmacotherapy (Medications Ordered): No orders of the defined types were placed in this encounter.  Orders:  Orders Placed This Encounter  Procedures   Caudal Epidural Injection    Standing Status:   Future    Expected Date:   12/08/2023    Expiration Date:   02/06/2024    Scheduling Instructions:     Laterality: Right     Level(s): Sacrococcygeal canal (Tailbone area)     Sedation: without     Scheduling Timeframe: As soon as pre-approved    Where will this procedure be performed?:   [facility]   SACROILIAC JOINT INJECTION    Standing Status:   Future    Expected Date:   12/08/2023    Expiration Date:   02/06/2024    Scheduling Instructions:     Side: Bilateral      Sedation: IV Versed     Timeframe: ASAP    Where will this procedure be performed?:   [facility]   TRIGGER POINT  INJECTION    Standing Status:    Future    Expiration Date:   02/06/2024    Scheduling Instructions:     B/L piriformis TPI    Where will this procedure be performed?:   [facility]   Follow-up plan:   Return in about 1 month (around 12/08/2023) for Caudal ESI + B/L SJ + Piriformis TPI, in clinic IV Versed.     Recent Visits No visits were found meeting these conditions. Showing recent visits within past 90 days and meeting all other requirements Today's Visits Date Type Provider Dept  11/06/23 Office Visit Christine Jolly, MD [facility]  Showing today's visits and meeting all other requirements Future Appointments Date Type Provider Dept  12/10/23 Appointment Christine Jolly, MD [facility]  Showing future appointments within next 90 days and meeting all other requirements  I discussed the assessment and treatment plan with the patient. The patient was provided an opportunity to ask questions and all were answered. The patient agreed with the plan and demonstrated an understanding of the instructions.  Patient advised to call back or seek an in-person evaluation if the symptoms or condition worsens.  Duration of encounter: .  Total time on encounter, as per AMA guidelines included both the face-to-face and non-face-to-face time personally spent by the physician and/or other qualified health care professional(s) on the day of the encounter (includes time in activities that require the physician or other qualified health care professional and does not include time in activities normally performed by clinical staff). Physician's time may include the following activities when performed: Preparing to see the patient (e.g., pre-charting review of records, searching for previously ordered imaging, lab work, and nerve conduction tests) Review of prior analgesic pharmacotherapies. Reviewing PMP Interpreting ordered tests (e.g., lab work, imaging, nerve conduction tests) Performing  post-procedure evaluations, including interpretation of diagnostic procedures Obtaining and/or reviewing separately obtained history Performing a medically appropriate examination and/or evaluation Counseling and educating the patient/family/caregiver Ordering medications, tests, or procedures Referring and communicating with other health care professionals (when not separately reported) Documenting clinical information in the electronic or other health record Independently interpreting results (not separately reported) and communicating results to the patient/ family/caregiver Care coordination (not separately reported)  Note by: Christine Jolly, MD Date: 11/06/2023; Time: 1:27 PM

## 2023-11-06 NOTE — Progress Notes (Signed)
 Safety precautions to be maintained throughout the outpatient stay will include: orient to surroundings, keep bed in low position, maintain call bell within reach at all times, provide assistance with transfer out of bed and ambulation.

## 2023-11-06 NOTE — Patient Instructions (Addendum)
 GENERAL RISKS AND COMPLICATIONS  What are the risk, side effects and possible complications? Generally speaking, most procedures are safe.  However, with any procedure there are risks, side effects, and the possibility of complications.  The risks and complications are dependent upon the sites that are lesioned, or the type of nerve block to be performed.  The closer the procedure is to the spine, the more serious the risks are.  Great care is taken when placing the radio frequency needles, block needles or lesioning probes, but sometimes complications can occur. Infection: Any time there is an injection through the skin, there is a risk of infection.  This is why sterile conditions are used for these blocks.  There are four possible types of infection. Localized skin infection. Central Nervous System Infection-This can be in the form of Meningitis, which can be deadly. Epidural Infections-This can be in the form of an epidural abscess, which can cause pressure inside of the spine, causing compression of the spinal cord with subsequent paralysis. This would require an emergency surgery to decompress, and there are no guarantees that the patient would recover from the paralysis. Discitis-This is an infection of the intervertebral discs.  It occurs in about 1% of discography procedures.  It is difficult to treat and it may lead to surgery.        2. Pain: the needles have to go through skin and soft tissues, will cause soreness.       3. Damage to internal structures:  The nerves to be lesioned may be near blood vessels or    other nerves which can be potentially damaged.       4. Bleeding: Bleeding is more common if the patient is taking blood thinners such as  aspirin, Coumadin, Ticiid, Plavix, etc., or if he/she have some genetic predisposition  such as hemophilia. Bleeding into the spinal canal can cause compression of the spinal  cord with subsequent paralysis.  This would require an emergency  surgery to  decompress and there are no guarantees that the patient would recover from the  paralysis.       5. Pneumothorax:  Puncturing of a lung is a possibility, every time a needle is introduced in  the area of the chest or upper back.  Pneumothorax refers to free air around the  collapsed lung(s), inside of the thoracic cavity (chest cavity).  Another two possible  complications related to a similar event would include: Hemothorax and Chylothorax.   These are variations of the Pneumothorax, where instead of air around the collapsed  lung(s), you may have blood or chyle, respectively.       6. Spinal headaches: They may occur with any procedures in the area of the spine.       7. Persistent CSF (Cerebro-Spinal Fluid) leakage: This is a rare problem, but may occur  with prolonged intrathecal or epidural catheters either due to the formation of a fistulous  track or a dural tear.       8. Nerve damage: By working so close to the spinal cord, there is always a possibility of  nerve damage, which could be as serious as a permanent spinal cord injury with  paralysis.       9. Death:  Although rare, severe deadly allergic reactions known as "Anaphylactic  reaction" can occur to any of the medications used.      10. Worsening of the symptoms:  We can always make thing worse.  What are the chances  of something like this happening? Chances of any of this occuring are extremely low.  By statistics, you have more of a chance of getting killed in a motor vehicle accident: while driving to the hospital than any of the above occurring .  Nevertheless, you should be aware that they are possibilities.  In general, it is similar to taking a shower.  Everybody knows that you can slip, hit your head and get killed.  Does that mean that you should not shower again?  Nevertheless always keep in mind that statistics do not mean anything if you happen to be on the wrong side of them.  Even if a procedure has a 1 (one) in a  1,000,000 (million) chance of going wrong, it you happen to be that one..Also, keep in mind that by statistics, you have more of a chance of having something go wrong when taking medications.  Who should not have this procedure? If you are on a blood thinning medication (e.g. Coumadin, Plavix, see list of "Blood Thinners"), or if you have an active infection going on, you should not have the procedure.  If you are taking any blood thinners, please inform your physician.  How should I prepare for this procedure? Do not eat or drink anything at least six hours prior to the procedure. Bring a driver with you .  It cannot be a taxi. Come accompanied by an adult that can drive you back, and that is strong enough to help you if your legs get weak or numb from the local anesthetic. Take all of your medicines the morning of the procedure with just enough water to swallow them. If you have diabetes, make sure that you are scheduled to have your procedure done first thing in the morning, whenever possible. If you have diabetes, take only half of your insulin dose and notify our nurse that you have done so as soon as you arrive at the clinic. If you are diabetic, but only take blood sugar pills (oral hypoglycemic), then do not take them on the morning of your procedure.  You may take them after you have had the procedure. Do not take aspirin or any aspirin-containing medications, at least eleven (11) days prior to the procedure.  They may prolong bleeding. Wear loose fitting clothing that may be easy to take off and that you would not mind if it got stained with Betadine or blood. Do not wear any jewelry or perfume Remove any nail coloring.  It will interfere with some of our monitoring equipment.  NOTE: Remember that this is not meant to be interpreted as a complete list of all possible complications.  Unforeseen problems may occur.  BLOOD THINNERS The following drugs contain aspirin or other products,  which can cause increased bleeding during surgery and should not be taken for 2 weeks prior to and 1 week after surgery.  If you should need take something for relief of minor pain, you may take acetaminophen which is found in Tylenol,m Datril, Anacin-3 and Panadol. It is not blood thinner. The products listed below are.  Do not take any of the products listed below in addition to any listed on your instruction sheet.  A.P.C or A.P.C with Codeine Codeine Phosphate Capsules #3 Ibuprofen Ridaura  ABC compound Congesprin Imuran rimadil  Advil Cope Indocin Robaxisal  Alka-Seltzer Effervescent Pain Reliever and Antacid Coricidin or Coricidin-D  Indomethacin Rufen  Alka-Seltzer plus Cold Medicine Cosprin Ketoprofen S-A-C Tablets  Anacin Analgesic Tablets or Capsules Coumadin  Korlgesic Salflex  Anacin Extra Strength Analgesic tablets or capsules CP-2 Tablets Lanoril Salicylate  Anaprox Cuprimine Capsules Levenox Salocol  Anexsia-D Dalteparin Magan Salsalate  Anodynos Darvon compound Magnesium Salicylate Sine-off  Ansaid Dasin Capsules Magsal Sodium Salicylate  Anturane Depen Capsules Marnal Soma  APF Arthritis pain formula Dewitt's Pills Measurin Stanback  Argesic Dia-Gesic Meclofenamic Sulfinpyrazone  Arthritis Bayer Timed Release Aspirin Diclofenac Meclomen Sulindac  Arthritis pain formula Anacin Dicumarol Medipren Supac  Analgesic (Safety coated) Arthralgen Diffunasal Mefanamic Suprofen  Arthritis Strength Bufferin Dihydrocodeine Mepro Compound Suprol  Arthropan liquid Dopirydamole Methcarbomol with Aspirin Synalgos  ASA tablets/Enseals Disalcid Micrainin Tagament  Ascriptin Doan's Midol Talwin  Ascriptin A/D Dolene Mobidin Tanderil  Ascriptin Extra Strength Dolobid Moblgesic Ticlid  Ascriptin with Codeine Doloprin or Doloprin with Codeine Momentum Tolectin  Asperbuf Duoprin Mono-gesic Trendar  Aspergum Duradyne Motrin or Motrin IB Triminicin  Aspirin plain, buffered or enteric coated  Durasal Myochrisine Trigesic  Aspirin Suppositories Easprin Nalfon Trillsate  Aspirin with Codeine Ecotrin Regular or Extra Strength Naprosyn Uracel  Atromid-S Efficin Naproxen Ursinus  Auranofin Capsules Elmiron Neocylate Vanquish  Axotal Emagrin Norgesic Verin  Azathioprine Empirin or Empirin with Codeine Normiflo Vitamin E  Azolid Emprazil Nuprin Voltaren  Bayer Aspirin plain, buffered or children's or timed BC Tablets or powders Encaprin Orgaran Warfarin Sodium  Buff-a-Comp Enoxaparin Orudis Zorpin  Buff-a-Comp with Codeine Equegesic Os-Cal-Gesic   Buffaprin Excedrin plain, buffered or Extra Strength Oxalid   Bufferin Arthritis Strength Feldene Oxphenbutazone   Bufferin plain or Extra Strength Feldene Capsules Oxycodone with Aspirin   Bufferin with Codeine Fenoprofen Fenoprofen Pabalate or Pabalate-SF   Buffets II Flogesic Panagesic   Buffinol plain or Extra Strength Florinal or Florinal with Codeine Panwarfarin   Buf-Tabs Flurbiprofen Penicillamine   Butalbital Compound Four-way cold tablets Penicillin   Butazolidin Fragmin Pepto-Bismol   Carbenicillin Geminisyn Percodan   Carna Arthritis Reliever Geopen Persantine   Carprofen Gold's salt Persistin   Chloramphenicol Goody's Phenylbutazone   Chloromycetin Haltrain Piroxlcam   Clmetidine heparin Plaquenil   Cllnoril Hyco-pap Ponstel   Clofibrate Hydroxy chloroquine Propoxyphen         Before stopping any of these medications, be sure to consult the physician who ordered them.  Some, such as Coumadin (Warfarin) are ordered to prevent or treat serious conditions such as "deep thrombosis", "pumonary embolisms", and other heart problems.  The amount of time that you may need off of the medication may also vary with the medication and the reason for which you were taking it.  If you are taking any of these medications, please make sure you notify your pain physician before you undergo any procedures.         Moderate Conscious  Sedation, Adult Sedation is the use of medicines to help you relax and not feel pain. Moderate conscious sedation is a type of sedation that makes you less alert than normal. You are still able to respond to instructions, touch, or both. This type of sedation is used during short medical and dental procedures. It is milder than deep sedation, which is a type of sedation you cannot be easily woken up from. It is also milder than general anesthesia, which is the use of medicines to make you fall asleep. Moderate conscious sedation lets you return to your normal activities sooner. Tell a health care provider about: Any allergies you have. All medicines you are taking, including vitamins, herbs, steroids, eye drops, creams, and over-the-counter medicines. Any problems you or family members have had with anesthesia.  Any bleeding problems you have. Any surgeries you have had. Any medical conditions you have. Whether you are pregnant or may be pregnant. Any recent alcohol, tobacco, or drug use. What are the risks? Your health care provider will talk with you about risks. These may include: Oversedation. This is when you get too much medicine. Nausea or vomiting. Allergic reaction to medicines. Trouble breathing. If this happens, a breathing tube may be used. It will be removed when you can breathe better on your own. Heart trouble. Lung trouble. Emergence delirium. This is when you feel confused while the sedation wears off. This gets better with time. What happens before the procedure? When to stop eating and drinking Follow instructions from your health care provider about what you may eat and drink. These may include: 8 hours before your procedure Stop eating most foods. Do not eat meat, fried foods, or fatty foods. Eat only light foods, such as toast or crackers. All liquids are okay except energy drinks and alcohol. 6 hours before your procedure Stop eating. Drink only clear liquids, such  as water, clear fruit juice, black coffee, plain tea, and sports drinks. Do not drink energy drinks or alcohol. 2 hours before your procedure Stop drinking all liquids. You may be allowed to take medicines with small sips of water. If you do not follow your health care provider's instructions, your procedure may be delayed or canceled. Medicines Ask your health care provider about: Changing or stopping your regular medicines. These include any diabetes medicines or blood thinners you take. Taking medicines such as aspirin and ibuprofen. These medicines can thin your blood. Do not take them unless your health care provider tells you to. Taking over-the-counter medicines, vitamins, herbs, and supplements. Tests and exams You may have an exam or testing. You may have a blood or urine sample taken. General instructions Do not use any products that contain nicotine or tobacco for at least 4 weeks before the procedure. These products include cigarettes, chewing tobacco, and vaping devices, such as e-cigarettes. If you need help quitting, ask your health care provider. If you will be going home right after the procedure, plan to have a responsible adult: Take you home from the hospital or clinic. You will not be allowed to drive. Care for you for the time you are told. What happens during the procedure?  You will be given the sedative. It may be given: As a pill you can take by mouth. It can also be put into the rectum. As a spray through the nose. As an injection into muscle. As an injection into a vein through an IV. You may be given oxygen as needed. Your blood pressure, heart rate, breathing rate, and blood oxygen level will be monitored during the procedure. The medical or dental procedure will be done. The procedure may vary among health care providers and hospitals. What happens after the procedure? Your blood pressure, heart rate, breathing rate, and blood oxygen level will be  monitored until you leave the hospital or clinic. You will get fluids through an IV as needed. Do not drive or operate machinery until your health care provider says that it is safe. This information is not intended to replace advice given to you by your health care provider. Make sure you discuss any questions you have with your health care provider. Document Revised: 02/25/2022 Document Reviewed: 02/25/2022 [employer] Patient Education  2024 [employer]Trigger Point Injections Patient Information  Description: Trigger points are areas of muscle sensitive to  touch which cause pain with movement, sometimes felt some distance from the site of palpation.  Usually the muscle containing these trigger points if felt as a tight band or knot.   The area of maximum tenderness or trigger point is identified, and after antiseptic preparation of the skin, a small needle is placed into this site.  Reproduction of the pain often occurs and numbing medicine (local anesthetic) is injected into the site, sometimes along with steroid preparation.  The entire block usually lasts less than 5 minutes.  Conditions which may be treated by trigger points:  Muscular pain and spasm Nerve irritation  Preparation for the injection:  Do not eat any solid food or dairy products within 8 hours of your appointment. You may drink clear liquids up to 3 hours before appointment.  Clear liquids include water, black coffee, juice or soda.  No milk or cream please. You may take your regular medications, including pain medications, with a sip of water before your appointment.  Diabetics should hold regular insulin ( if take separately) and take 1/2 normal NPH dose the morning of the procedure.  Carry some sugar containing items with you to your appointment. A driver must accompany you and be prepared to drive you home after your procedure.  Bring all your current medications with you. An IV may be inserted and sedation may be  given at the discretion of the physician.  A blood pressure cuff, EKG, and other monitors will often be applied during the procedure.  Some patients may need to have extra oxygen administered for a short period. You will be asked to provide medical information, including your allergies and medications, prior to the procedure.  We must know immediately if you are taking blood thinners (like Coumadin/Warfarin) or if you are allergic to IV iodine contrast (dye).  We must know if you could possibly be pregnant.  Possible side-effects:  Bleeding from needle site Infection (rare, may require surgery) Nerve injury (rare) Numbness & tingling (temporary) Punctured lung (if injection around chest) Light-headedness (temporary) Pain at injection site (several days) Decreased blood pressure (rare, temporary) Weakness in arm/leg (temporary)  Call if you experience:  Hive or difficulty breathing (go to the emergency room) Inflammation or drainage at the injection site(s)  Please note:  Although the local anesthetic injected can often make your painful muscle feel good for several hours after the injection, the pain may return.  It takes 3-7 days for steroids to work.  You may not notice any pain relief for at least one week.  If effective, we will often do a series of injections spaced 3-6 weeks apart to maximally decrease your pain.  If you have any questions please call [phone] [facility] Clinic[facility]ro[facility]ac (SI) Joint Injection Patient Information  Description: The sacroiliac joint connects the scrum (very low back and tailbone) to the ilium (a pelvic bone which also forms half of the hip joint).  Normally this joint experiences very little motion.  When this joint becomes inflamed or unstable low back and or hip and pelvis pain may result.  Injection of this joint with local anesthetics (numbing medicines) and steroids can provide diagnostic information and reduce  pain.  This injection is performed with the aid of x-ray guidance into the tailbone area while you are lying on your stomach.   You may experience an electrical sensation down the leg while this is being done.  You may also experience numbness.  We also may ask  if we are reproducing your normal pain during the injection.  Conditions which may be treated SI injection:  Low back, buttock, hip or leg pain  Preparation for the Injection:  Do not eat any solid food or dairy products within 8 hours of your appointment.  You may drink clear liquids up to 3 hours before appointment.  Clear liquids include water, black coffee, juice or soda.  No milk or cream please. You may take your regular medications, including pain medications with a sip of water before your appointment.  Diabetics should hold regular insulin (if take separately) and take 1/2 normal NPH dose the morning of the procedure.  Carry some sugar containing items with you to your appointment. A driver must accompany you and be prepared to drive you home after your procedure. Bring all of your current medications with you. An IV may be inserted and sedation may be given at the discretion of the physician. A blood pressure cuff, EKG and other monitors will often be applied during the procedure.  Some patients may need to have extra oxygen administered for a short period.  You will be asked to provide medical information, including your allergies, prior to the procedure.  We must know immediately if you are taking blood thinners (like Coumadin/Warfarin) or if you are allergic to IV iodine contrast (dye).  We must know if you could possible be pregnant.  Possible side effects:  Bleeding from needle site Infection (rare, may require surgery) Nerve injury (rare) Numbness & tingling (temporary) A brief convulsion or seizure Light-headedness (temporary) Pain at injection site (several days) Decreased blood pressure (temporary) Weakness in  the leg (temporary)   Call if you experience:  New onset weakness or numbness of an extremity below the injection site that last more than 8 hours. Hives or difficulty breathing ( go to the emergency room) Inflammation or drainage at the injection site Any new symptoms which are concerning to you  Please note:  Although the local anesthetic injected can often make your back/ hip/ buttock/ leg feel good for several hours after the injections, the pain will likely return.  It takes 3-7 days for steroids to work in the sacroiliac area.  You may not notice any pain relief for at least that one week.  If effective, we will often do a series of three injections spaced 3-6 weeks apart to maximally decrease your pain.  After the initial series, we generally will wait some months before a repeat injection of the same type.  If you have any questions, please call [phone] [facility]  Epidural Steroid Injection Patient Information  Description: The epidural space surrounds the nerves as they exit the spinal cord.  In some patients, the nerves can be compressed and inflamed by a bulging disc or a tight spinal canal (spinal stenosis).  By injecting steroids into the epidural space, we can bring irritated nerves into direct contact with a potentially helpful medication.  These steroids act directly on the irritated nerves and can reduce swelling and inflammation which often leads to decreased pain.  Epidural steroids may be injected anywhere along the spine and from the neck to the low back depending upon the location of your pain.   After numbing the skin with local anesthetic (like Novocaine), a small needle is passed into the epidural space slowly.  You may experience a sensation of pressure while this is being done.  The entire block usually last less than 10 minutes.  Conditions which may be treated by epidural steroids:  Low back and leg pain Neck and arm  pain Spinal stenosis Post-laminectomy syndrome Herpes zoster (shingles) pain Pain from compression fractures  Preparation for the injection:  Do not eat any solid food or dairy products within 8 hours of your appointment.  You may drink clear liquids up to 3 hours before appointment.  Clear liquids include water, black coffee, juice or soda.  No milk or cream please. You may take your regular medication, including pain medications, with a sip of water before your appointment  Diabetics should hold regular insulin (if taken separately) and take 1/2 normal NPH dos the morning of the procedure.  Carry some sugar containing items with you to your appointment. A driver must accompany you and be prepared to drive you home after your procedure.  Bring all your current medications with your. An IV may be inserted and sedation may be given at the discretion of the physician.   A blood pressure cuff, EKG and other monitors will often be applied during the procedure.  Some patients may need to have extra oxygen administered for a short period. You will be asked to provide medical information, including your allergies, prior to the procedure.  We must know immediately if you are taking blood thinners (like Coumadin/Warfarin)  Or if you are allergic to IV iodine contrast (dye). We must know if you could possible be pregnant.  Possible side-effects: Bleeding from needle site Infection (rare, may require surgery) Nerve injury (rare) Numbness & tingling (temporary) Difficulty urinating (rare, temporary) Spinal headache ( a headache worse with upright posture) Light -headedness (temporary) Pain at injection site (several days) Decreased blood pressure (temporary) Weakness in arm/leg (temporary) Pressure sensation in back/neck (temporary)  Call if you experience: Fever/chills associated with headache or increased back/neck pain. Headache worsened by an upright position. New onset weakness or numbness  of an extremity below the injection site Hives or difficulty breathing (go to the emergency room) Inflammation or drainage at the infection site Severe back/neck pain Any new symptoms which are concerning to you  Please note:  Although the local anesthetic injected can often make your back or neck feel good for several hours after the injection, the pain will likely return.  It takes 3-7 days for steroids to work in the epidural space.  You may not notice any pain relief for at least that one week.  If effective, we will often do a series of three injections spaced 3-6 weeks apart to maximally decrease your pain.  After the initial series, we generally will wait several months before considering a repeat injection of the same type.  If you have any questions, please call [phone] [facility]

## 2023-11-11 ENCOUNTER — Ambulatory Visit
Admission: RE | Admit: 2023-11-11 | Discharge: 2023-11-11 | Disposition: A | Discharge status: Home / Self Care | Source: Ambulatory Visit | Attending: Neurology | Admitting: Neurology

## 2023-11-11 DIAGNOSIS — R519 Headache, unspecified: Secondary | ICD-10-CM | POA: Diagnosis not present

## 2023-11-11 MED ORDER — GADOBUTROL 1 MMOL/ML IV SOLN
10.0000 mL | Freq: Once | INTRAVENOUS | Status: AC | PRN
  Administered 2023-11-11: 10 mL via INTRAVENOUS

## 2023-11-13 ENCOUNTER — Ambulatory Visit
Admission: RE | Admit: 2023-11-13 | Discharge: 2023-11-13 | Disposition: A | Discharge status: Home / Self Care | Source: Ambulatory Visit | Attending: Family Medicine | Admitting: Family Medicine

## 2023-11-13 ENCOUNTER — Ambulatory Visit
Admission: RE | Admit: 2023-11-13 | Discharge: 2023-11-13 | Discharge status: Home / Self Care | Source: Ambulatory Visit | Attending: Family Medicine | Admitting: Family Medicine

## 2023-11-13 DIAGNOSIS — M25561 Pain in right knee: Secondary | ICD-10-CM | POA: Diagnosis not present

## 2023-11-13 DIAGNOSIS — M25569 Pain in unspecified knee: Secondary | ICD-10-CM | POA: Diagnosis not present

## 2023-11-13 DIAGNOSIS — G8929 Other chronic pain: Secondary | ICD-10-CM | POA: Diagnosis not present

## 2023-11-13 DIAGNOSIS — Z96653 Presence of artificial knee joint, bilateral: Secondary | ICD-10-CM | POA: Diagnosis not present

## 2023-11-13 DIAGNOSIS — M25562 Pain in left knee: Secondary | ICD-10-CM | POA: Insufficient documentation

## 2023-11-13 MED ORDER — TECHNETIUM TC 99M MEDRONATE IV KIT
20.0000 | PACK | Freq: Once | INTRAVENOUS | Status: AC | PRN
  Administered 2023-11-13: 21.36 via INTRAVENOUS

## 2023-11-20 DIAGNOSIS — M170 Bilateral primary osteoarthritis of knee: Secondary | ICD-10-CM | POA: Diagnosis not present

## 2023-11-28 DIAGNOSIS — M545 Low back pain, unspecified: Secondary | ICD-10-CM | POA: Diagnosis not present

## 2023-12-02 DIAGNOSIS — M170 Bilateral primary osteoarthritis of knee: Secondary | ICD-10-CM | POA: Diagnosis not present

## 2023-12-03 DIAGNOSIS — L4050 Arthropathic psoriasis, unspecified: Secondary | ICD-10-CM | POA: Diagnosis not present

## 2023-12-10 ENCOUNTER — Ambulatory Visit
Admission: RE | Admit: 2023-12-10 | Discharge: 2023-12-10 | Disposition: A | Discharge status: Home / Self Care | Source: Ambulatory Visit | Attending: Student in an Organized Health Care Education/Training Program | Admitting: Student in an Organized Health Care Education/Training Program

## 2023-12-10 ENCOUNTER — Encounter: Payer: Self-pay | Admitting: Student in an Organized Health Care Education/Training Program

## 2023-12-10 ENCOUNTER — Ambulatory Visit
Attending: Student in an Organized Health Care Education/Training Program | Admitting: Student in an Organized Health Care Education/Training Program

## 2023-12-10 VITALS — BP 144/89 | HR 76 | Resp 16 | Ht 71.0 in | Wt 243.0 lb

## 2023-12-10 DIAGNOSIS — M5116 Intervertebral disc disorders with radiculopathy, lumbar region: Secondary | ICD-10-CM | POA: Diagnosis not present

## 2023-12-10 DIAGNOSIS — Z96653 Presence of artificial knee joint, bilateral: Secondary | ICD-10-CM | POA: Diagnosis not present

## 2023-12-10 DIAGNOSIS — Z01810 Encounter for preprocedural cardiovascular examination: Secondary | ICD-10-CM | POA: Diagnosis not present

## 2023-12-10 DIAGNOSIS — Z9889 Other specified postprocedural states: Secondary | ICD-10-CM

## 2023-12-10 DIAGNOSIS — M533 Sacrococcygeal disorders, not elsewhere classified: Secondary | ICD-10-CM | POA: Diagnosis not present

## 2023-12-10 DIAGNOSIS — G5703 Lesion of sciatic nerve, bilateral lower limbs: Secondary | ICD-10-CM

## 2023-12-10 DIAGNOSIS — M5416 Radiculopathy, lumbar region: Secondary | ICD-10-CM | POA: Diagnosis present

## 2023-12-10 DIAGNOSIS — G894 Chronic pain syndrome: Secondary | ICD-10-CM | POA: Insufficient documentation

## 2023-12-10 DIAGNOSIS — M461 Sacroiliitis, not elsewhere classified: Secondary | ICD-10-CM

## 2023-12-10 MED ORDER — SODIUM CHLORIDE 0.9% FLUSH
2.0000 mL | Freq: Once | INTRAVENOUS | Status: AC
  Administered 2023-12-10: 2 mL

## 2023-12-10 MED ORDER — DEXAMETHASONE SODIUM PHOSPHATE 10 MG/ML IJ SOLN
10.0000 mg | Freq: Once | INTRAMUSCULAR | Status: AC
  Administered 2023-12-10: 10 mg
  Filled 2023-12-10: qty 1

## 2023-12-10 MED ORDER — MIDAZOLAM HCL 2 MG/2ML IJ SOLN
0.5000 mg | Freq: Once | INTRAMUSCULAR | Status: AC
  Administered 2023-12-10: 2 mg via INTRAVENOUS
  Filled 2023-12-10: qty 2

## 2023-12-10 MED ORDER — METHYLPREDNISOLONE ACETATE 80 MG/ML IJ SUSP
80.0000 mg | Freq: Once | INTRAMUSCULAR | Status: AC
  Administered 2023-12-10: 80 mg via INTRA_ARTICULAR
  Filled 2023-12-10: qty 1

## 2023-12-10 MED ORDER — LIDOCAINE HCL 2 % IJ SOLN
20.0000 mL | Freq: Once | INTRAMUSCULAR | Status: AC
  Administered 2023-12-10: 400 mg
  Filled 2023-12-10: qty 40

## 2023-12-10 MED ORDER — IOHEXOL 180 MG/ML  SOLN
10.0000 mL | Freq: Once | INTRAMUSCULAR | Status: AC
  Administered 2023-12-10: 10 mL via EPIDURAL
  Filled 2023-12-10: qty 20

## 2023-12-10 MED ORDER — ROPIVACAINE HCL 2 MG/ML IJ SOLN
2.0000 mL | Freq: Once | INTRAMUSCULAR | Status: AC
  Administered 2023-12-10: 2 mL via EPIDURAL
  Filled 2023-12-10: qty 20

## 2023-12-10 MED ORDER — ROPIVACAINE HCL 2 MG/ML IJ SOLN
9.0000 mL | Freq: Once | INTRAMUSCULAR | Status: AC
  Administered 2023-12-10: 9 mL via PERINEURAL

## 2023-12-10 NOTE — Progress Notes (Signed)
 Safety precautions to be maintained throughout the outpatient stay will include: orient to surroundings, keep bed in low position, maintain call bell within reach at all times, provide assistance with transfer out of bed and ambulation.

## 2023-12-10 NOTE — Patient Instructions (Signed)
 ______________________________________________________________________    Post-Procedure Discharge Instructions  Instructions: Apply ice:  Purpose: This will minimize any swelling and discomfort after procedure.  When: Day of procedure, as soon as you get home. How: Fill a plastic sandwich bag with crushed ice. Cover it with a small towel and apply to injection site. How long: (15 min on, 15 min off) Apply for 15 minutes then remove x 15 minutes.  Repeat sequence on day of procedure, until you go to bed. Apply heat:  Purpose: To treat any soreness and discomfort from the procedure. When: Starting the next day after the procedure. How: Apply heat to procedure site starting the day following the procedure. How long: May continue to repeat daily, until discomfort goes away. Food intake: Start with clear liquids (like water) and advance to regular food, as tolerated.  Physical activities: Keep activities to a minimum for the first 8 hours after the procedure. After that, then as tolerated. Driving: If you have received any sedation, be responsible and do not drive. You are not allowed to drive for 24 hours after having sedation. Blood thinner: (Applies only to those taking blood thinners) You may restart your blood thinner 6 hours after your procedure. Insulin: (Applies only to Diabetic patients taking insulin) As soon as you can eat, you may resume your normal dosing schedule. Infection prevention: Keep procedure site clean and dry. Shower daily and clean area with soap and water. Post-procedure Pain Diary: Extremely important that this be done correctly and accurately. Recorded information will be used to determine the next step in treatment. For the purpose of accuracy, follow these rules: Evaluate only the area treated. Do not report or include pain from an untreated area. For the purpose of this evaluation, ignore all other areas of pain, except for the treated area. After your procedure,  avoid taking a long nap and attempting to complete the pain diary after you wake up. Instead, set your alarm clock to go off every hour, on the hour, for the initial 8 hours after the procedure. Document the duration of the numbing medicine, and the relief you are getting from it. Do not go to sleep and attempt to complete it later. It will not be accurate. If you received sedation, it is likely that you were given a medication that may cause amnesia. Because of this, completing the diary at a later time may cause the information to be inaccurate. This information is needed to plan your care. Follow-up appointment: Keep your post-procedure follow-up evaluation appointment after the procedure (usually 2 weeks for most procedures, 6 weeks for radiofrequencies). DO NOT FORGET to bring you pain diary with you.   Expect: (What should I expect to see with my procedure?) From numbing medicine (AKA: Local Anesthetics): Numbness or decrease in pain. You may also experience some weakness, which if present, could last for the duration of the local anesthetic. Onset: Full effect within 15 minutes of injected. Duration: It will depend on the type of local anesthetic used. On the average, 1 to 8 hours.  From steroids (Applies only if steroids were used): Decrease in swelling or inflammation. Once inflammation is improved, relief of the pain will follow. Onset of benefits: Depends on the amount of swelling present. The more swelling, the longer it will take for the benefits to be seen. In some cases, up to 10 days. Duration: Steroids will stay in the system x 2 weeks. Duration of benefits will depend on multiple posibilities including persistent irritating factors. Side-effects: If  present, they may typically last 2 weeks (the duration of the steroids). Frequent: Cramps (if they occur, drink Gatorade and take over-the-counter Magnesium 450-500 mg once to twice a day); water retention with temporary weight gain;  increases in blood sugar; decreased immune system response; increased appetite. Occasional: Facial flushing (red, warm cheeks); mood swings; menstrual changes. Uncommon: Long-term decrease or suppression of natural hormones; bone thinning. (These are more common with higher doses or more frequent use. This is why we prefer that our patients avoid having any injection therapies in other practices.)  Very Rare: Severe mood changes; psychosis; aseptic necrosis. From procedure: Some discomfort is to be expected once the numbing medicine wears off. This should be minimal if ice and heat are applied as instructed.  Call if: (When should I call?) You experience numbness and weakness that gets worse with time, as opposed to wearing off. New onset bowel or bladder incontinence. (Applies only to procedures done in the spine)  Emergency Numbers: Durning business hours (Monday - Thursday, 8:00 AM - 4:00 PM) (Friday, 9:00 AM - 12:00 Noon): [phone] After hours: [phone] NOTE: If you are having a problem and are unable connect with, or to talk to a provider, then go to your nearest urgent care or emergency department. If the problem is serious and urgent, please call 911. ______________________________________________________________________     Sacroiliac (SI) Joint Injection Patient Information  Description: The sacroiliac joint connects the scrum (very low back and tailbone) to the ilium (a pelvic bone which also forms half of the hip joint).  Normally this joint experiences very little motion.  When this joint becomes inflamed or unstable low back and or hip and pelvis pain may result.  Injection of this joint with local anesthetics (numbing medicines) and steroids can provide diagnostic information and reduce pain.  This injection is performed with the aid of x-ray guidance into the tailbone area while you are lying on your stomach.   You may experience an electrical sensation down the leg  while this is being done.  You may also experience numbness.  We also may ask if we are reproducing your normal pain during the injection.  Conditions which may be treated SI injection:  Low back, buttock, hip or leg pain  Preparation for the Injection:  Do not eat any solid food or dairy products within 8 hours of your appointment.  You may drink clear liquids up to 3 hours before appointment.  Clear liquids include water, black coffee, juice or soda.  No milk or cream please. You may take your regular medications, including pain medications with a sip of water before your appointment.  Diabetics should hold regular insulin (if take separately) and take 1/2 normal NPH dose the morning of the procedure.  Carry some sugar containing items with you to your appointment. A driver must accompany you and be prepared to drive you home after your procedure. Bring all of your current medications with you. An IV may be inserted and sedation may be given at the discretion of the physician. A blood pressure cuff, EKG and other monitors will often be applied during the procedure.  Some patients may need to have extra oxygen administered for a short period.  You will be asked to provide medical information, including your allergies, prior to the procedure.  We must know immediately if you are taking blood thinners (like Coumadin/Warfarin) or if you are allergic to IV iodine contrast (dye).  We must know if you could possible  be pregnant.  Possible side effects:  Bleeding from needle site Infection (rare, may require surgery) Nerve injury (rare) Numbness & tingling (temporary) A brief convulsion or seizure Light-headedness (temporary) Pain at injection site (several days) Decreased blood pressure (temporary) Weakness in the leg (temporary)   Call if you experience:  New onset weakness or numbness of an extremity below the injection site that last more than 8 hours. Hives or difficulty breathing (  go to the emergency room) Inflammation or drainage at the injection site Any new symptoms which are concerning to you  Please note:  Although the local anesthetic injected can often make your back/ hip/ buttock/ leg feel good for several hours after the injections, the pain will likely return.  It takes 3-7 days for steroids to work in the sacroiliac area.  You may not notice any pain relief for at least that one week.  If effective, we will often do a series of three injections spaced 3-6 weeks apart to maximally decrease your pain.  After the initial series, we generally will wait some months before a repeat injection of the same type.  If you have any questions, please call [phone] [facility]  Epidural Steroid Injection Patient Information  Description: The epidural space surrounds the nerves as they exit the spinal cord.  In some patients, the nerves can be compressed and inflamed by a bulging disc or a tight spinal canal (spinal stenosis).  By injecting steroids into the epidural space, we can bring irritated nerves into direct contact with a potentially helpful medication.  These steroids act directly on the irritated nerves and can reduce swelling and inflammation which often leads to decreased pain.  Epidural steroids may be injected anywhere along the spine and from the neck to the low back depending upon the location of your pain.   After numbing the skin with local anesthetic (like Novocaine), a small needle is passed into the epidural space slowly.  You may experience a sensation of pressure while this is being done.  The entire block usually last less than 10 minutes.  Conditions which may be treated by epidural steroids:  Low back and leg pain Neck and arm pain Spinal stenosis Post-laminectomy syndrome Herpes zoster (shingles) pain Pain from compression fractures  Preparation for the injection:  Do not eat any solid food or dairy  products within 8 hours of your appointment.  You may drink clear liquids up to 3 hours before appointment.  Clear liquids include water, black coffee, juice or soda.  No milk or cream please. You may take your regular medication, including pain medications, with a sip of water before your appointment  Diabetics should hold regular insulin (if taken separately) and take 1/2 normal NPH dos the morning of the procedure.  Carry some sugar containing items with you to your appointment. A driver must accompany you and be prepared to drive you home after your procedure.  Bring all your current medications with your. An IV may be inserted and sedation may be given at the discretion of the physician.   A blood pressure cuff, EKG and other monitors will often be applied during the procedure.  Some patients may need to have extra oxygen administered for a short period. You will be asked to provide medical information, including your allergies, prior to the procedure.  We must know immediately if you are taking blood thinners (like Coumadin/Warfarin)  Or if you are allergic to IV iodine contrast (dye).  We must know if you could possible be pregnant.  Possible side-effects: Bleeding from needle site Infection (rare, may require surgery) Nerve injury (rare) Numbness & tingling (temporary) Difficulty urinating (rare, temporary) Spinal headache ( a headache worse with upright posture) Light -headedness (temporary) Pain at injection site (several days) Decreased blood pressure (temporary) Weakness in arm/leg (temporary) Pressure sensation in back/neck (temporary)  Call if you experience: Fever/chills associated with headache or increased back/neck pain. Headache worsened by an upright position. New onset weakness or numbness of an extremity below the injection site Hives or difficulty breathing (go to the emergency room) Inflammation or drainage at the infection site Severe back/neck pain Any new  symptoms which are concerning to you  Please note:  Although the local anesthetic injected can often make your back or neck feel good for several hours after the injection, the pain will likely return.  It takes 3-7 days for steroids to work in the epidural space.  You may not notice any pain relief for at least that one week.  If effective, we will often do a series of three injections spaced 3-6 weeks apart to maximally decrease your pain.  After the initial series, we generally will wait several months before considering a repeat injection of the same type.  If you have any questions, please call [phone] [facility]

## 2023-12-10 NOTE — Progress Notes (Signed)
 PROVIDER NOTE: Information contained herein reflects review and annotations entered in association with encounter. Interpretation of such information and data should be left to medically-trained personnel. Information provided to patient can be located elsewhere in the medical record under "Patient Instructions". Document created using STT-dictation technology, any transcriptional errors that may result from process are unintentional.    Patient: Christine Higgins  Service Category: Procedure  Provider: Edward Jolly, MD  DOB: 1956/03/26  DOS: 12/10/2023  Location: [facility]  MRN: [id number]  Setting: Ambulatory - outpatient  Referring Provider: Lauro Regulus, MD  Type: Established Patient  Specialty: Interventional Pain Management  PCP: Lauro Regulus, MD   Primary Reason for Visit: Interventional Pain Management Treatment. CC: Other (buttock)  Procedure:          Anesthesia, Analgesia, Anxiolysis:  Type: Palliative Epidural Steroid Injection          Region: Caudal aiming to the right Level: Sacrococcygeal   Laterality: Midline       Type: Local Anesthesia 2 mg IV Versed Local Anesthetic: Lidocaine 1-2%  Position: Prone   Indications: 1. Lumbar radiculopathy   2. History of lumbar laminectomy for spinal cord decompression   3. Sacroiliac joint pain   4. SI joint arthritis (HCC)   5. Piriformis syndrome of both sides   6. Chronic pain syndrome    Pain Score: Pre-procedure: 9 /10 Post-procedure: 9 /10   Pre-op H&P Assessment:  Christine Higgins is a 68 y.o. (year old), female patient, seen today for interventional treatment. She  has a past surgical history that includes carpel tunn (Right); Hand surgery (Right); carpel tunnel (Left); Cesarean section; Shoulder surgery (Right); Back surgery; Neck surgery; Total hip arthroplasty (Right); Excision Morton's neuroma (Left, 02/05/2017); Colonoscopy; Joint replacement; Esophagogastroduodenoscopy (egd) with propofol (N/A,  06/26/2017); Knee Arthroplasty (Left, 08/11/2017); Knee arthroscopy (Right, 07/02/2018); Reverse shoulder arthroplasty (Left, 06/24/2019); Thoracic laminectomy for spinal cord stimulator (N/A, 08/07/2020); Colonoscopy with propofol (N/A, 10/20/2020); Cardiac catheterization (2006); Tubal ligation; Knee Arthroplasty (Right, 02/05/2021); Spinal cord stimulator removal (N/A, 04/09/2021); Cataract extraction (Bilateral, 2022); Fracture surgery (1969); Lumbar laminectomy/decompression microdiscectomy (N/A, 06/19/2022); and Dilatation & curettage/hysteroscopy with myosure (N/A, 10/24/2022). Christine Higgins has a current medication list which includes the following prescription(s): acetaminophen, albuterol, amlodipine, atorvastatin, budesonide-formoterol, calcium carbonate, celecoxib, vitamin d3 super strength, clobetasol, clobetasol cream, clonazepam, duloxetine, duloxetine, esomeprazole, fluocinolone acetonide body, fluticasone, gabapentin, hydrocodone-acetaminophen, ibandronate, ketoconazole, ketoconazole, levocetirizine, linaclotide, losartan, methimazole, methocarbamol, mounjaro, multivitamin with minerals, naloxone hcl, potassium chloride, pregabalin, propranolol, sucralfate, terbinafine, torsemide, and trazodone. Her primarily concern today is the Other (buttock)  Initial Vital Signs:  Pulse/HCG Rate: 76ECG Heart Rate: 77 Temp:   Resp: 18 BP: (!) 144/83 SpO2: 100 %  BMI: Estimated body mass index is 33.89 kg/m as calculated from the following:   Height as of this encounter: 5\' 11"  (1.803 m).   Weight as of this encounter: 243 lb (110.2 kg).  Risk Assessment: Allergies: Reviewed. She is allergic to cephalexin, ibuprofen, potassium chloride, shellfish allergy, and aspirin.  Allergy Precautions: None required Coagulopathies: Reviewed. None identified.  Blood-thinner therapy: None at this time Active Infection(s): Reviewed. None identified. Christine Higgins is afebrile  Site Confirmation: Christine Higgins was asked to  confirm the procedure and laterality before marking the site Procedure checklist: Completed Consent: Before the procedure and under the influence of no sedative(s), amnesic(s), or anxiolytics, the patient was informed of the treatment options, risks and possible complications. To fulfill our ethical and legal obligations, as recommended by the American Medical Association's  Code of Ethics, I have informed the patient of my clinical impression; the nature and purpose of the treatment or procedure; the risks, benefits, and possible complications of the intervention; the alternatives, including doing nothing; the risk(s) and benefit(s) of the alternative treatment(s) or procedure(s); and the risk(s) and benefit(s) of doing nothing. The patient was provided information about the general risks and possible complications associated with the procedure. These may include, but are not limited to: failure to achieve desired goals, infection, bleeding, organ or nerve damage, allergic reactions, paralysis, and death. In addition, the patient was informed of those risks and complications associated to Spine-related procedures, such as failure to decrease pain; infection (i.e.: Meningitis, epidural or intraspinal abscess); bleeding (i.e.: epidural hematoma, subarachnoid hemorrhage, or any other type of intraspinal or peri-dural bleeding); organ or nerve damage (i.e.: Any type of peripheral nerve, nerve root, or spinal cord injury) with subsequent damage to sensory, motor, and/or autonomic systems, resulting in permanent pain, numbness, and/or weakness of one or several areas of the body; allergic reactions; (i.e.: anaphylactic reaction); and/or death. Furthermore, the patient was informed of those risks and complications associated with the medications. These include, but are not limited to: allergic reactions (i.e.: anaphylactic or anaphylactoid reaction(s)); adrenal axis suppression; blood sugar elevation that in diabetics  may result in ketoacidosis or comma; water retention that in patients with history of congestive heart failure may result in shortness of breath, pulmonary edema, and decompensation with resultant heart failure; weight gain; swelling or edema; medication-induced neural toxicity; particulate matter embolism and blood vessel occlusion with resultant organ, and/or nervous system infarction; and/or aseptic necrosis of one or more joints. Finally, the patient was informed that Medicine is not an exact science; therefore, there is also the possibility of unforeseen or unpredictable risks and/or possible complications that may result in a catastrophic outcome. The patient indicated having understood very clearly. We have given the patient no guarantees and we have made no promises. Enough time was given to the patient to ask questions, all of which were answered to the patient's satisfaction. Ms. Rajagopalan has indicated that she wanted to continue with the procedure. Attestation: I, the ordering provider, attest that I have discussed with the patient the benefits, risks, side-effects, alternatives, likelihood of achieving goals, and potential problems during recovery for the procedure that I have provided informed consent. Date  Time: 12/10/2023  1:44 PM  Pre-Procedure Preparation:  Monitoring: As per clinic protocol. Respiration, ETCO2, SpO2, BP, heart rate and rhythm monitor placed and checked for adequate function Safety Precautions: Patient was assessed for positional comfort and pressure points before starting the procedure. Time-out: I initiated and conducted the "Time-out" before starting the procedure, as per protocol. The patient was asked to participate by confirming the accuracy of the "Time Out" information. Verification of the correct person, site, and procedure were performed and confirmed by me, the nursing staff, and the patient. "Time-out" conducted as per Joint Commission's Universal Protocol  (UP.01.01.01). Time: 1419  Description of Procedure:          Target Area: Caudal Epidural Canal. Approach: Midline approach. Area Prepped: Entire Posterior Sacrococcygeal Region DuraPrep (Iodine Povacrylex [0.7% available iodine] and Isopropyl Alcohol, 74% w/w) Safety Precautions: Aspiration looking for blood return was conducted prior to all injections. At no point did we inject any substances, as a needle was being advanced. No attempts were made at seeking any paresthesias. Safe injection practices and needle disposal techniques used. Medications properly checked for expiration dates. SDV (single dose vial)  medications used. Description of the Procedure: Protocol guidelines were followed. The patient was placed in position over the fluoroscopy table. The target area was identified and the area prepped in the usual manner. Skin & deeper tissues infiltrated with local anesthetic. Appropriate amount of time allowed to pass for local anesthetics to take effect. The procedure needles were then advanced to the target area. Proper needle placement secured. Negative aspiration confirmed. Solution injected in intermittent fashion, asking for systemic symptoms every 0.5cc of injectate. The needles were then removed and the area cleansed, making sure to leave some of the prepping solution back to take advantage of its long term bactericidal properties. Vitals:   12/10/23 1420 12/10/23 1425 12/10/23 1430 12/10/23 1432  BP: (!) 150/91 (!) 159/99 (!) 149/97 (!) 150/93  Pulse:      Resp: 18 18 16 16   SpO2: 97% 100% 99% 97%  Weight:      Height:        Start Time: 1419 hrs. End Time: 1432 hrs. Materials:  Needle(s) Type: Epidural needle Gauge: 22G Length: 3.5-in Medication(s): Please see orders for medications and dosing details. 6 cc solution made of 3 cc of preservative-free saline, 2 cc of 0.2% ropivacaine, 1 cc of Decadron 10 mg/cc.  Imaging Guidance (Spinal):          Type of Imaging Technique:  Fluoroscopy Guidance (Spinal) Indication(s): Assistance in needle guidance and placement for procedures requiring needle placement in or near specific anatomical locations not easily accessible without such assistance. Exposure Time: Please see nurses notes. Contrast: Before injecting any contrast, we confirmed that the patient did not have an allergy to iodine, shellfish, or radiological contrast. Once satisfactory needle placement was completed at the desired level, radiological contrast was injected. Contrast injected under live fluoroscopy. No contrast complications. See chart for type and volume of contrast used. Fluoroscopic Guidance: I was personally present during the use of fluoroscopy. "Tunnel Vision Technique" used to obtain the best possible view of the target area. Parallax error corrected before commencing the procedure. "Direction-depth-direction" technique used to introduce the needle under continuous pulsed fluoroscopy. Once target was reached, antero-posterior, oblique, and lateral fluoroscopic projection used confirm needle placement in all planes. Images permanently stored in EMR. Interpretation: I personally interpreted the imaging intraoperatively. Adequate needle placement confirmed in multiple planes. Appropriate spread of contrast into desired area was observed. No evidence of afferent or efferent intravascular uptake. No intrathecal or subarachnoid spread observed. Permanent images saved into the patient's record.  Post-operative Assessment:  Post-procedure Vital Signs:  Pulse/HCG Rate: 7676 Temp:   Resp: 16 BP: (!) 150/93 SpO2: 97 %  EBL: None  Complications: No immediate post-treatment complications observed by team, or reported by patient.  Note: The patient tolerated the entire procedure well. A repeat set of vitals were taken after the procedure and the patient was kept under observation following institutional policy, for this type of procedure. Post-procedural  neurological assessment was performed, showing return to baseline, prior to discharge. The patient was provided with post-procedure discharge instructions, including a section on how to identify potential problems. Should any problems arise concerning this procedure, the patient was given instructions to immediately contact us, at any time, without hesitation. In any case, we plan to contact the patient by telephone for a follow-up status report regarding this interventional procedure.  Comments:  No additional relevant information.  Plan of Care  Orders:  Orders Placed This Encounter  Procedures   DG PAIN CLIN[facility] C-ARM 1-60 MIN NO REPORT  Intraoperative interpretation by procedural physician at [facility].    Standing Status:   Standing    Number of Occurrences:   1    Reason for exam::   Assistance in needle guidance and placement for procedures requiring needle placement in or near specific anatomical locations not easily accessible without such assistance.   Medications ordered for procedure: Meds ordered this encounter  Medications   iohexol (OMNIPAQUE) 180 MG/ML injection 10 mL    Must be Myelogram-compatible. If not available, you may substitute with a water-soluble, non-ionic, hypoallergenic, myelogram-compatible radiological contrast medium.   lidocaine (XYLOCAINE) 2 % (with pres) injection 400 mg   midazolam (VERSED) injection 0.5-2 mg    Make sure Flumazenil is available in the pyxis when using this medication. If oversedation occurs, administer 0.2 mg IV over 15 sec. If after 45 sec no response, administer 0.2 mg again over 1 min; may repeat at 1 min intervals; not to exceed 4 doses (1 mg)   ropivacaine (PF) 2 mg/mL (0.2%) (NAROPIN) injection 2 mL   sodium chloride flush (NS) 0.9 % injection 2 mL   dexamethasone (DECADRON) injection 10 mg   dexamethasone (DECADRON) injection 10 mg   methylPREDNISolone acetate (DEPO-MEDROL) injection 80 mg   ropivacaine (PF) 2 mg/mL  (0.2%) (NAROPIN) injection 9 mL   Medications administered: We administered iohexol, lidocaine, midazolam, ropivacaine (PF) 2 mg/mL (0.2%), sodium chloride flush, dexamethasone, dexamethasone, methylPREDNISolone acetate, and ropivacaine (PF) 2 mg/mL (0.2%).  See the medical record for exact dosing, route, and time of administration.  Follow-up plan:   Return in about 8 weeks (around 02/04/2024) for PPE, VV.    Recent Visits Date Type Provider Dept  11/06/23 Office Visit Edward Jolly, MD [facility]  Showing recent visits within past 90 days and meeting all other requirements Today's Visits Date Type Provider Dept  12/10/23 Procedure visit Edward Jolly, MD [facility]  Showing today's visits and meeting all other requirements Future Appointments Date Type Provider Dept  02/04/24 Appointment Edward Jolly, MD [facility]  Showing future appointments within next 90 days and meeting all other requirements  Disposition: Discharge home  Discharge (Date  Time): 12/10/2023; 1440 hrs.   Primary Care Physician: Lauro Regulus, MD Location: [facility] Note by: Edward Jolly, MD Date: 12/10/2023; Time: 2:36 PM  Disclaimer:  Medicine is not an exact science. The only guarantee in medicine is that nothing is guaranteed. It is important to note that the decision to proceed with this intervention was based on the information collected from the patient. The Data and conclusions were drawn from the patient's questionnaire, the interview, and the physical examination. Because the information was provided in large part by the patient, it cannot be guaranteed that it has not been purposely or unconsciously manipulated. Every effort has been made to obtain as much relevant data as possible for this evaluation. It is important to note that the conclusions that lead to this procedure are derived in large part from the available data. Always take  into account that the treatment will also be dependent on availability of resources and existing treatment guidelines, considered by other Pain Management Practitioners as being common knowledge and practice, at the time of the intervention. For Medico-Legal purposes, it is also important to point out that variation in procedural techniques and pharmacological choices are the acceptable norm. The indications, contraindications, technique, and results of the above procedure should only be interpreted and judged by a Board-Certified Interventional  Pain Specialist with extensive familiarity and expertise in the same exact procedure and technique.

## 2023-12-10 NOTE — Progress Notes (Signed)
 PROVIDER NOTE: Interpretation of information contained herein should be left to medically-trained personnel. Specific patient instructions are provided elsewhere under "Patient Instructions" section of medical record. This document was created in part using STT-dictation technology, any transcriptional errors that may result from this process are unintentional.  Patient: Christine Higgins Type: Established DOB: 10-Jan-1956 MRN: [id number] PCP: Jimmy Moulding, MD  Service: Procedure DOS: 12/10/2023 Setting: Ambulatory Location: Ambulatory outpatient facility Delivery: Face-to-face Provider: Cephus Collin, MD Specialty: Interventional Pain Management Specialty designation: 09 Location: Outpatient facility Ref. Prov.: Jimmy Moulding, MD       Interventional Therapy   Procedure: Sacroiliac Joint Steroid Injection #1    Laterality: Bilateral     Level: PIIS (Posterior Inferior Iliac Spine)  Target: Interarticular sacroiliac joint. Location: Medial to the postero-medial edge of iliac spine. Region: Lumbosacral-sacrococcygeal. Approach: Inferior postero-medial percutaneous approach. Type of procedure: Percutaneous joint injection.  Imaging: Fluoroscopy-guided Non-spinal ([id number]) Anesthesia: Local anesthesia (1-2% Lidocaine) Sedation: Minimal Sedation                       DOS: 12/10/2023  Performed by: Cephus Collin, MD  Purpose: Diagnostic/Therapeutic Indications: Sacroiliac joint pain in the lower back and hip area severe enough to impact quality of life or function. Rationale (medical necessity): procedure needed and proper for the diagnosis and/or treatment of Christine Higgins's medical symptoms and needs. Bilateral SI joint arthritis, SI joint pain and bilateral piriformis syndrome NAS-11 Pain score:   Pre-procedure: 9 /10   Post-procedure: 0-No pain/10      Position / Prep / Materials:  Position: Prone  Prep solution: ChloraPrep (2% chlorhexidine gluconate and 70% isopropyl  alcohol) Prep Area: Entire posterior lumbosacral area  Materials:  Tray: Block Needle(s):  Type: Spinal  Gauge (G): 22  Length: 3.5-in Qty: One (1) per procedure side.  H&P (Pre-op Assessment):  Christine Higgins is a 68 y.o. (year old), female patient, seen today for interventional treatment. She  has a past surgical history that includes carpel tunn (Right); Hand surgery (Right); carpel tunnel (Left); Cesarean section; Shoulder surgery (Right); Back surgery; Neck surgery; Total hip arthroplasty (Right); Excision Morton's neuroma (Left, 02/05/2017); Colonoscopy; Joint replacement; Esophagogastroduodenoscopy (egd) with propofol (N/A, 06/26/2017); Knee Arthroplasty (Left, 08/11/2017); Knee arthroscopy (Right, 07/02/2018); Reverse shoulder arthroplasty (Left, 06/24/2019); Thoracic laminectomy for spinal cord stimulator (N/A, 08/07/2020); Colonoscopy with propofol (N/A, 10/20/2020); Cardiac catheterization (2006); Tubal ligation; Knee Arthroplasty (Right, 02/05/2021); Spinal cord stimulator removal (N/A, 04/09/2021); Cataract extraction (Bilateral, 2022); Fracture surgery (1969); Lumbar laminectomy/decompression microdiscectomy (N/A, 06/19/2022); and Dilatation & curettage/hysteroscopy with myosure (N/A, 10/24/2022). Christine Higgins has a current medication list which includes the following prescription(s): acetaminophen, albuterol, amlodipine, atorvastatin, budesonide-formoterol, calcium carbonate, celecoxib, vitamin d3 super strength, clobetasol, clobetasol cream, clonazepam, duloxetine, duloxetine, esomeprazole, fluocinolone acetonide body, fluticasone, gabapentin, hydrocodone-acetaminophen, ibandronate, ketoconazole, ketoconazole, levocetirizine, linaclotide, losartan, methimazole, methocarbamol, mounjaro, multivitamin with minerals, naloxone hcl, potassium chloride, pregabalin, propranolol, sucralfate, terbinafine, torsemide, and trazodone. Her primarily concern today is the Other (buttock)  Initial Vital Signs:   Pulse/HCG Rate: 76ECG Heart Rate: 77 Temp:   Resp: 18 BP: (!) 144/83 SpO2: 100 %  BMI: Estimated body mass index is 33.89 kg/m as calculated from the following:   Height as of this encounter: 5\' 11"  (1.803 m).   Weight as of this encounter: 243 lb (110.2 kg).  Risk Assessment: Allergies: Reviewed. She is allergic to cephalexin, ibuprofen, potassium chloride, shellfish allergy, and aspirin.  Allergy Precautions: None required Coagulopathies: Reviewed. None identified.  Blood-thinner therapy: None at this  time Active Infection(s): Reviewed. None identified. Christine Higgins is afebrile  Site Confirmation: Christine Higgins was asked to confirm the procedure and laterality before marking the site Procedure checklist: Completed Consent: Before the procedure and under the influence of no sedative(s), amnesic(s), or anxiolytics, the patient was informed of the treatment options, risks and possible complications. To fulfill our ethical and legal obligations, as recommended by the American Medical Association's Code of Ethics, I have informed the patient of my clinical impression; the nature and purpose of the treatment or procedure; the risks, benefits, and possible complications of the intervention; the alternatives, including doing nothing; the risk(s) and benefit(s) of the alternative treatment(s) or procedure(s); and the risk(s) and benefit(s) of doing nothing. The patient was provided information about the general risks and possible complications associated with the procedure. These may include, but are not limited to: failure to achieve desired goals, infection, bleeding, organ or nerve damage, allergic reactions, paralysis, and death. In addition, the patient was informed of those risks and complications associated to the procedure, such as failure to decrease pain; infection; bleeding; organ or nerve damage with subsequent damage to sensory, motor, and/or autonomic systems, resulting in permanent pain,  numbness, and/or weakness of one or several areas of the body; allergic reactions; (i.e.: anaphylactic reaction); and/or death. Furthermore, the patient was informed of those risks and complications associated with the medications. These include, but are not limited to: allergic reactions (i.e.: anaphylactic or anaphylactoid reaction(s)); adrenal axis suppression; blood sugar elevation that in diabetics may result in ketoacidosis or comma; water retention that in patients with history of congestive heart failure may result in shortness of breath, pulmonary edema, and decompensation with resultant heart failure; weight gain; swelling or edema; medication-induced neural toxicity; particulate matter embolism and blood vessel occlusion with resultant organ, and/or nervous system infarction; and/or aseptic necrosis of one or more joints. Finally, the patient was informed that Medicine is not an exact science; therefore, there is also the possibility of unforeseen or unpredictable risks and/or possible complications that may result in a catastrophic outcome. The patient indicated having understood very clearly. We have given the patient no guarantees and we have made no promises. Enough time was given to the patient to ask questions, all of which were answered to the patient's satisfaction. Ms. Alvidrez has indicated that she wanted to continue with the procedure. Attestation: I, the ordering provider, attest that I have discussed with the patient the benefits, risks, side-effects, alternatives, likelihood of achieving goals, and potential problems during recovery for the procedure that I have provided informed consent. Date  Time: 12/10/2023  1:44 PM  Pre-Procedure Preparation:  Monitoring: As per clinic protocol. Respiration, ETCO2, SpO2, BP, heart rate and rhythm monitor placed and checked for adequate function Safety Precautions: Patient was assessed for positional comfort and pressure points before starting the  procedure. Time-out: I initiated and conducted the "Time-out" before starting the procedure, as per protocol. The patient was asked to participate by confirming the accuracy of the "Time Out" information. Verification of the correct person, site, and procedure were performed and confirmed by me, the nursing staff, and the patient. "Time-out" conducted as per Joint Commission's Universal Protocol (UP.01.01.01). Time: 1419 Start Time: 1419 hrs.  Description/Narrative of Procedure:          Start Time: 1419 hrs.  Rationale (medical necessity): procedure needed and proper for the diagnosis and/or treatment of the patient's medical symptoms and needs. Procedural Technique Safety Precautions: Aspiration looking for blood return was conducted prior  to all injections. At no point did we inject any substances, as a needle was being advanced. No attempts were made at seeking any paresthesias. Safe injection practices and needle disposal techniques used. Medications properly checked for expiration dates. SDV (single dose vial) medications used. Description of the Procedure: Protocol guidelines were followed. The patient was assisted into a comfortable position. The target area was identified and the area prepped in the usual manner. Skin & deeper tissues infiltrated with local anesthetic. Appropriate amount of time allowed to pass for local anesthetics to take effect. The procedure needles were then advanced to the target area. Proper needle placement secured. Negative aspiration confirmed. Solution injected in intermittent fashion, asking for systemic symptoms every 0.5cc of injectate. The needles were then removed and the area cleansed, making sure to leave some of the prepping solution back to take advantage of its long term bactericidal properties.  Technical description of procedure:  Fluoroscopy using a posterior anterior 45 degree angle from the midline aiming at the anterolateral aspect of the patient was  used to find a direct path into the sacroiliac joint, the superior medial to posterior superior iliac spine.  The skin was marked where the desired target and the skin infiltrated with local anesthetics.  The procedure needle was then advanced until the joint was entered.  Once inside of the joint, we then proceeded to inject the desired solution.  10 cc solution made of 9 cc of 0.2% ropivacaine, 1 cc of methylprednisolone, 80 mg/cc.  5 cc injected into the left SI joint, 5 cc injected into the right SI joint.   Afterwards a right & left piriformis trigger point injection was done 1 cm inferior, 1 cm deep, 1 cm lateral to the inferior fissure of the SI joint.  Contrast was injected to confirm piriformis muscle striation.  While injecting, patient did not complain of any pain radiating down her leg.  10 cc solution consisting of 9 cc of 0.2% lidocaine, 1 cc of Decadron 10 mg/cc.  5 cc injected into the left piriformis, 5 cc injected into the right piriformis.     Vitals:   12/10/23 1425 12/10/23 1430 12/10/23 1432 12/10/23 1447  BP: (!) 159/99 (!) 149/97 (!) 150/93 (!) 144/89  Pulse:      Resp: 18 16 16 16   SpO2: 100% 99% 97% 99%  Weight:      Height:         End Time: 1432 hrs.  Imaging Guidance (Non-Spinal):          Type of Imaging Technique: Fluoroscopy Guidance (Non-Spinal) Indication(s): Fluoroscopy guidance for needle placement to enhance accuracy in procedures requiring precise needle localization for targeted delivery of medication in or near specific anatomical locations not easily accessible without such real-time imaging assistance. Exposure Time: Please see nurses notes. Contrast: Before injecting any contrast, we confirmed that the patient did not have an allergy to iodine, shellfish, or radiological contrast. Once satisfactory needle placement was completed at the desired level, radiological contrast was injected. Contrast injected under live fluoroscopy. No contrast  complications. See chart for type and volume of contrast used. Fluoroscopic Guidance: I was personally present during the use of fluoroscopy. "Tunnel Vision Technique" used to obtain the best possible view of the target area. Parallax error corrected before commencing the procedure. "Direction-depth-direction" technique used to introduce the needle under continuous pulsed fluoroscopy. Once target was reached, antero-posterior, oblique, and lateral fluoroscopic projection used confirm needle placement in all planes. Images permanently stored in EMR.  Interpretation: I personally interpreted the imaging intraoperatively. Adequate needle placement confirmed in multiple planes. Appropriate spread of contrast into desired area was observed. No evidence of afferent or efferent intravascular uptake. Permanent images saved into the patient's record.  Post-operative Assessment:  Post-procedure Vital Signs:  Pulse/HCG Rate: 7676 Temp:   Resp: 16 BP: (!) 144/89 SpO2: 99 %  EBL: None  Complications: No immediate post-treatment complications observed by team, or reported by patient.  Note: The patient tolerated the entire procedure well. A repeat set of vitals were taken after the procedure and the patient was kept under observation following institutional policy, for this type of procedure. Post-procedural neurological assessment was performed, showing return to baseline, prior to discharge. The patient was provided with post-procedure discharge instructions, including a section on how to identify potential problems. Should any problems arise concerning this procedure, the patient was given instructions to immediately contact us, at any time, without hesitation. In any case, we plan to contact the patient by telephone for a follow-up status report regarding this interventional procedure.  Comments:  No additional relevant information.  Plan of Care (POC)  Orders:  Orders Placed This Encounter  Procedures    DG PAIN CLINIC C-ARM 1-60 MIN NO REPORT    Intraoperative interpretation by procedural physician at [facility].    Standing Status:   Standing    Number of Occurrences:   1    Reason for exam::   Assistance in needle guidance and placement for procedures requiring needle placement in or near specific anatomical locations not easily accessible without such assistance.   Chronic Opioid Analgesic:  Tramadol 100 mg twice daily, quantity 120/month MME equals 20   Medications ordered for procedure: Meds ordered this encounter  Medications   iohexol (OMNIPAQUE) 180 MG/ML injection 10 mL    Must be Myelogram-compatible. If not available, you may substitute with a water-soluble, non-ionic, hypoallergenic, myelogram-compatible radiological contrast medium.   lidocaine (XYLOCAINE) 2 % (with pres) injection 400 mg   midazolam (VERSED) injection 0.5-2 mg    Make sure Flumazenil is available in the pyxis when using this medication. If oversedation occurs, administer 0.2 mg IV over 15 sec. If after 45 sec no response, administer 0.2 mg again over 1 min; may repeat at 1 min intervals; not to exceed 4 doses (1 mg)   ropivacaine (PF) 2 mg/mL (0.2%) (NAROPIN) injection 2 mL   sodium chloride flush (NS) 0.9 % injection 2 mL   dexamethasone (DECADRON) injection 10 mg   dexamethasone (DECADRON) injection 10 mg   methylPREDNISolone acetate (DEPO-MEDROL) injection 80 mg   ropivacaine (PF) 2 mg/mL (0.2%) (NAROPIN) injection 9 mL   Medications administered: We administered iohexol, lidocaine, midazolam, ropivacaine (PF) 2 mg/mL (0.2%), sodium chloride flush, dexamethasone, dexamethasone, methylPREDNISolone acetate, and ropivacaine (PF) 2 mg/mL (0.2%).  See the medical record for exact dosing, route, and time of administration.  Follow-up plan:   Return in about 8 weeks (around 02/04/2024) for PPE, VV.       Recent Visits Date Type Provider Dept  11/06/23 Office Visit Edward Jolly, MD [facility]  Showing recent visits within past 90 days and meeting all other requirements Today's Visits Date Type Provider Dept  12/10/23 Procedure visit Edward Jolly, MD [facility]  Showing today's visits and meeting all other requirements Future Appointments Date Type Provider Dept  02/04/24 Appointment Edward Jolly, MD [facility]  Showing future appointments within next 90 days and meeting all other requirements  Disposition:  Discharge home  Discharge (Date  Time): 12/10/2023; 1452 hrs.   Primary Care Physician: Jimmy Moulding, MD Location: [facility] Note by: Cephus Collin, MD (TTS technology used. I apologize for any typographical errors that were not detected and corrected.) Date: 12/10/2023; Time: 3:44 PM  Disclaimer:  Medicine is not an Visual merchandiser. The only guarantee in medicine is that nothing is guaranteed. It is important to note that the decision to proceed with this intervention was based on the information collected from the patient. The Data and conclusions were drawn from the patient's questionnaire, the interview, and the physical examination. Because the information was provided in large part by the patient, it cannot be guaranteed that it has not been purposely or unconsciously manipulated. Every effort has been made to obtain as much relevant data as possible for this evaluation. It is important to note that the conclusions that lead to this procedure are derived in large part from the available data. Always take into account that the treatment will also be dependent on availability of resources and existing treatment guidelines, considered by other Pain Management Practitioners as being common knowledge and practice, at the time of the intervention. For Medico-Legal purposes, it is also important to point out that variation in procedural techniques and pharmacological choices are the acceptable norm. The  indications, contraindications, technique, and results of the above procedure should only be interpreted and judged by a Board-Certified Interventional Pain Specialist with extensive familiarity and expertise in the same exact procedure and technique.

## 2023-12-11 ENCOUNTER — Telehealth: Payer: Self-pay

## 2023-12-11 NOTE — Telephone Encounter (Signed)
 No issues post-procedure.

## 2023-12-19 ENCOUNTER — Other Ambulatory Visit: Payer: Self-pay | Admitting: Obstetrics and Gynecology

## 2023-12-19 DIAGNOSIS — Z1231 Encounter for screening mammogram for malignant neoplasm of breast: Secondary | ICD-10-CM

## 2023-12-22 DIAGNOSIS — G5603 Carpal tunnel syndrome, bilateral upper limbs: Secondary | ICD-10-CM | POA: Diagnosis not present

## 2023-12-30 DIAGNOSIS — M461 Sacroiliitis, not elsewhere classified: Secondary | ICD-10-CM | POA: Diagnosis not present

## 2023-12-30 DIAGNOSIS — M4306 Spondylolysis, lumbar region: Secondary | ICD-10-CM | POA: Diagnosis not present

## 2024-01-08 ENCOUNTER — Encounter

## 2024-01-13 ENCOUNTER — Encounter (INDEPENDENT_AMBULATORY_CARE_PROVIDER_SITE_OTHER): Payer: Self-pay

## 2024-01-13 ENCOUNTER — Ambulatory Visit
Admission: RE | Admit: 2024-01-13 | Discharge: 2024-01-13 | Disposition: A | Discharge status: Home / Self Care | Source: Ambulatory Visit | Attending: Obstetrics and Gynecology | Admitting: Obstetrics and Gynecology

## 2024-01-13 DIAGNOSIS — Z1231 Encounter for screening mammogram for malignant neoplasm of breast: Secondary | ICD-10-CM | POA: Insufficient documentation

## 2024-01-25 ENCOUNTER — Other Ambulatory Visit: Payer: Self-pay

## 2024-01-25 ENCOUNTER — Emergency Department

## 2024-01-25 ENCOUNTER — Emergency Department
Admission: EM | Admit: 2024-01-25 | Discharge: 2024-01-25 | Disposition: A | Discharge status: Home / Self Care | Attending: Emergency Medicine | Admitting: Emergency Medicine

## 2024-01-25 DIAGNOSIS — M48061 Spinal stenosis, lumbar region without neurogenic claudication: Secondary | ICD-10-CM | POA: Diagnosis not present

## 2024-01-25 DIAGNOSIS — Z96651 Presence of right artificial knee joint: Secondary | ICD-10-CM | POA: Insufficient documentation

## 2024-01-25 DIAGNOSIS — S300XXA Contusion of lower back and pelvis, initial encounter: Secondary | ICD-10-CM | POA: Insufficient documentation

## 2024-01-25 DIAGNOSIS — M25539 Pain in unspecified wrist: Secondary | ICD-10-CM | POA: Diagnosis not present

## 2024-01-25 DIAGNOSIS — M5136 Other intervertebral disc degeneration, lumbar region with discogenic back pain only: Secondary | ICD-10-CM | POA: Diagnosis not present

## 2024-01-25 DIAGNOSIS — M47816 Spondylosis without myelopathy or radiculopathy, lumbar region: Secondary | ICD-10-CM | POA: Diagnosis not present

## 2024-01-25 DIAGNOSIS — S0990XA Unspecified injury of head, initial encounter: Secondary | ICD-10-CM | POA: Insufficient documentation

## 2024-01-25 DIAGNOSIS — Z981 Arthrodesis status: Secondary | ICD-10-CM | POA: Diagnosis not present

## 2024-01-25 DIAGNOSIS — S3992XA Unspecified injury of lower back, initial encounter: Secondary | ICD-10-CM | POA: Diagnosis present

## 2024-01-25 DIAGNOSIS — Y92009 Unspecified place in unspecified non-institutional (private) residence as the place of occurrence of the external cause: Secondary | ICD-10-CM | POA: Diagnosis not present

## 2024-01-25 DIAGNOSIS — J4520 Mild intermittent asthma, uncomplicated: Secondary | ICD-10-CM | POA: Insufficient documentation

## 2024-01-25 DIAGNOSIS — E119 Type 2 diabetes mellitus without complications: Secondary | ICD-10-CM | POA: Diagnosis not present

## 2024-01-25 DIAGNOSIS — W19XXXA Unspecified fall, initial encounter: Secondary | ICD-10-CM | POA: Diagnosis not present

## 2024-01-25 DIAGNOSIS — S199XXA Unspecified injury of neck, initial encounter: Secondary | ICD-10-CM | POA: Diagnosis not present

## 2024-01-25 DIAGNOSIS — R9082 White matter disease, unspecified: Secondary | ICD-10-CM | POA: Diagnosis not present

## 2024-01-25 DIAGNOSIS — R519 Headache, unspecified: Secondary | ICD-10-CM | POA: Diagnosis not present

## 2024-01-25 DIAGNOSIS — M25532 Pain in left wrist: Secondary | ICD-10-CM | POA: Diagnosis not present

## 2024-01-25 DIAGNOSIS — W01198A Fall on same level from slipping, tripping and stumbling with subsequent striking against other object, initial encounter: Secondary | ICD-10-CM | POA: Insufficient documentation

## 2024-01-25 MED ORDER — MORPHINE SULFATE (PF) 4 MG/ML IV SOLN
4.0000 mg | Freq: Once | INTRAVENOUS | Status: AC
  Administered 2024-01-25: 4 mg via INTRAMUSCULAR
  Filled 2024-01-25: qty 1

## 2024-01-25 NOTE — ED Provider Notes (Signed)
 [facility] Provider Note    Event Date/Time   First MD Initiated Contact with Patient 01/25/24 1445     (approximate)   History   Fall   HPI  Christine Higgins is a 68 y.o. female with a past medical history of chronic back pain status post lumbar laminectomy for spinal cord decompression in 2015 who presents today for evaluation after a slip and fall.  Patient reports that she tripped on her granddaughters foot and landed on her buttocks and hit her head.  No LOC.  She has been able to walk since the event.  She denies paresthesias or weakness in her legs, but reports that she has pain with trying to walk.  She has not had any vomiting.  No urinary or fecal incontinence or retention.  Patient Active Problem List   Diagnosis Date Noted   Sacroiliac joint pain 11/06/2023   Lymphedema 02/18/2023   Cervical radicular pain (R>L) 10/08/2022   S/P cervical spinal fusion 10/08/2022   Controlled diabetes mellitus type 2 with complications (HCC) 07/25/2022   Epidural hematoma (HCC) 06/19/2022   Spondylolisthesis of lumbar region 06/17/2022   Primary osteoarthritis of left hip 09/12/2021   Bilateral carpal tunnel syndrome 06/20/2021   SI joint arthritis (HCC) 04/24/2021   Failed spinal cord stimulator (HCC) 04/24/2021   S/P total knee arthroplasty, right 03/25/2021   Total knee replacement status 02/05/2021   Piriformis syndrome of both sides 01/02/2021   Chronic pain 08/07/2020   Chronic midline thoracic back pain 03/27/2020   Psoriasis 12/22/2019   Psoriatic arthritis (HCC) 12/22/2019   Multiple thyroid  nodules 10/01/2019   Urticaria, chronic 07/14/2019   History of colon polyps 07/09/2019   Status post reverse arthroplasty of shoulder, left 06/24/2019   History of lumbar laminectomy for spinal cord decompression (L3-L4, 2015) 02/25/2019   Lumbar radiculopathy 02/25/2019   Other neutropenia (HCC) 02/16/2019   Primary osteoarthritis involving multiple joints  02/16/2019   Polyarthralgia 02/02/2019   Cervical spondylosis 09/14/2018   Mild intermittent asthma with acute exacerbation 08/13/2018   Tachycardia 08/18/2017   Status post total left knee replacement 08/11/2017   Chronic pain syndrome 07/30/2017   Fibromyalgia 07/30/2017   Pelvic pain in female 06/24/2017   Dysuria 06/24/2017   Generalized anxiety disorder 06/23/2017   Primary insomnia 06/23/2017   Lumbar spondylosis 06/23/2017   Foraminal stenosis of cervical region 06/23/2017   Right carpal tunnel syndrome 03/27/2017   HTN, goal below 140/90 03/05/2017   Graves' orbitopathy 02/17/2017   Primary osteoarthritis of right knee 10/18/2016   Bilateral arm weakness 07/16/2016   Spinal stenosis of lumbar region 03/22/2016   Back pain at L4-L5 level 02/13/2016   Depression, major, in remission (HCC) 12/21/2015   Failed back surgical syndrome 10/30/2015   DDD (degenerative disc disease), lumbosacral 10/30/2015   Status post total replacement of right hip 08/21/2015   Leg pain 07/28/2015   Health care maintenance 06/21/2015   Impingement syndrome of shoulder, left 10/20/2014   Severe obesity (BMI 35.0-35.9 with comorbidity) (HCC) 08/09/2014   Atherosclerosis of abdominal aorta (HCC) 07/29/2014   DM II (diabetes mellitus, type II), controlled (HCC) 04/19/2014   Hemorrhoids 03/14/2014   Osteoporosis 02/03/2014   Migraines 02/03/2014   Hyperlipidemia, unspecified 02/03/2014   GERD (gastroesophageal reflux disease) 02/03/2014          Physical Exam   Triage Vital Signs: ED Triage Vitals  Encounter Vitals Group     BP 01/25/24 1411 (!) 134/108  Systolic BP Percentile --      Diastolic BP Percentile --      Pulse Rate 01/25/24 1411 75     Resp 01/25/24 1411 18     Temp 01/25/24 1411 98.4 F (36.9 C)     Temp Source 01/25/24 1411 Oral     SpO2 01/25/24 1411 100 %     Weight 01/25/24 1410 234 lb (106.1 kg)     Height 01/25/24 1410 5\' 11"  (1.803 m)     Head Circumference  --      Peak Flow --      Pain Score 01/25/24 1410 6     Pain Loc --      Pain Education --      Exclude from Growth Chart --     Most recent vital signs: Vitals:   01/25/24 1411  BP: (!) 134/108  Pulse: 75  Resp: 18  Temp: 98.4 F (36.9 C)  SpO2: 100%    Physical Exam Vitals and nursing note reviewed.  Constitutional:      General: Awake and alert. No acute distress.    Appearance: Normal appearance. The patient is normal weight.  HENT:     Head: Normocephalic and atraumatic.     Mouth: Mucous membranes are moist.  Eyes:     General: PERRL. Normal EOMs        Right eye: No discharge.        Left eye: No discharge.     Conjunctiva/sclera: Conjunctivae normal.  Cardiovascular:     Rate and Rhythm: Normal rate and regular rhythm.     Pulses: Normal pulses.  Pulmonary:     Effort: Pulmonary effort is normal. No respiratory distress.     Breath sounds: Normal breath sounds.  Abdominal:     Abdomen is soft. There is no abdominal tenderness. No rebound or guarding. No distention. Musculoskeletal:        General: No swelling. Normal range of motion.     Cervical back: Normal range of motion and neck supple.  Back: No midline tenderness. Strength and sensation 5/5 to bilateral lower extremities. Normal great toe extension against resistance. Normal sensation throughout feet. Normal patellar reflexes. Negative SLR and opposite SLR bilaterally.  Left wrist: Mild swelling to the radial aspect though full and normal range of motion of wrist and all fingers.  Normal abduction and adduction of thumb.  Able to supinate and pronate without difficulty.  Normal radial pulse.  No tenderness to forearm, elbow, humerus, or shoulder. Skin:    General: Skin is warm and dry.     Capillary Refill: Capillary refill takes less than 2 seconds.     Findings: No rash.  Neurological:     Mental Status: The patient is awake and alert.      ED Results / Procedures / Treatments   Labs (all  labs ordered are listed, but only abnormal results are displayed) Labs Reviewed - No data to display   EKG     RADIOLOGY I independently reviewed and interpreted imaging and agree with radiologists findings.     PROCEDURES:  Critical Care performed:   Procedures   MEDICATIONS ORDERED IN ED: Medications  morphine  (PF) 4 MG/ML injection 4 mg (4 mg Intramuscular Given 01/25/24 1539)     IMPRESSION / MDM / ASSESSMENT AND PLAN / ED COURSE  I reviewed the triage vital signs and the nursing notes.   Differential diagnosis includes, but is not limited to, compression fracture, intracranial hemorrhage, cervical  spine injury, contusion, radiculopathy.  Patient is awake and alert, hemodynamically stable and afebrile.  I reviewed the patient's chart.  She has had interventional treatments for her known lumbar disc disease, and follows with them regularly.  She has 5 out of 5 strength with intact sensation to extensor hallucis dorsiflexion and plantarflexion of bilateral lower extremities with normal patellar reflexes bilaterally. No red flags or history or physical exam findings to suggest cauda equina syndrome or spinal cord compression. She is ambulatory. No focal neurological deficits on exam.   CT head and neck obtained per [place] criteria as well, and these were unremarkable.  X-ray obtained of her wrist as well, negative for any acute osseous injuries.  She was placed in an Ace wrap for extra support.  Patient was treated symptomatically with good effect.  She is ambulatory with a steady gait.  Discussed return precautions and outpatient follow-up.  Patient understands and agrees with plan.  She was discharged in stable condition.  Patient's presentation is most consistent with acute complicated illness / injury requiring diagnostic workup.       FINAL CLINICAL IMPRESSION(S) / ED DIAGNOSES   Final diagnoses:  Fall in home, initial encounter  Contusion of lower back,  initial encounter     Rx / DC Orders   ED Discharge Orders     None        Note:  This document was prepared using Dragon voice recognition software and may include unintentional dictation errors.   Sheniah Supak E, PA-C 01/25/24 1746    Kandee Orion, MD 01/25/24 [place]

## 2024-01-25 NOTE — Discharge Instructions (Signed)
Please follow-up with your outpatient provider.  Please return for any new, worsening, or change in symptoms or other concerns.  It was a pleasure caring for you today. 

## 2024-01-25 NOTE — ED Triage Notes (Signed)
 Arrived via EMS from home due to a fall. Pt fell back and landed on her back. Pt had no LOC or blood thinners.

## 2024-02-04 ENCOUNTER — Telehealth: Admitting: Student in an Organized Health Care Education/Training Program

## 2024-02-06 ENCOUNTER — Telehealth: Payer: Self-pay

## 2024-02-06 NOTE — Telephone Encounter (Signed)
 Spoke with patient and went over pre-appointment nurse questions for telephone visit with Dr. Rhesa Celeste on 02/09/24.

## 2024-02-09 ENCOUNTER — Ambulatory Visit
Attending: Student in an Organized Health Care Education/Training Program | Admitting: Student in an Organized Health Care Education/Training Program

## 2024-02-09 DIAGNOSIS — M533 Sacrococcygeal disorders, not elsewhere classified: Secondary | ICD-10-CM

## 2024-02-09 DIAGNOSIS — G5703 Lesion of sciatic nerve, bilateral lower limbs: Secondary | ICD-10-CM

## 2024-02-09 DIAGNOSIS — M461 Sacroiliitis, not elsewhere classified: Secondary | ICD-10-CM | POA: Diagnosis not present

## 2024-02-09 DIAGNOSIS — G894 Chronic pain syndrome: Secondary | ICD-10-CM | POA: Diagnosis not present

## 2024-02-09 NOTE — Progress Notes (Signed)
 PROVIDER NOTE: Interpretation of information contained herein should be left to medically-trained personnel. Specific patient instructions are provided elsewhere under Patient Instructions section of medical record. This document was created in part using AI and STT-dictation technology, any transcriptional errors that may result from this process are unintentional.  Patient: Christine Higgins  Service: E/M   PCP: Jimmy Moulding, MD  DOB: 09-20-1955  DOS: 02/09/2024  Provider: Cephus Collin, MD  MRN: [id number]  Delivery: Virtual Visit  Specialty: Interventional Pain Management  Type: Established Patient  Setting: Ambulatory outpatient facility  Specialty designation: 09  Referring Prov.: Jimmy Moulding, MD  Location: Remote location       Virtual Encounter - Pain Management PROVIDER NOTE: Information contained herein reflects review and annotations entered in association with encounter. Interpretation of such information and data should be left to medically-trained personnel. Information provided to patient can be located elsewhere in the medical record under Patient Instructions. Document created using STT-dictation technology, any transcriptional errors that may result from process are unintentional.    Contact & Pharmacy Preferred: [phone] Home: [phone] (home) Mobile: [phone] (mobile) E-mail: [email or web address]  [facility] [place], [place] - [address] [place] [address] Phone: [phone] Fax: [phone]   Pre-screening  Ms. Dunnam offered in-person vs virtual encounter. She indicated preferring virtual for this encounter.   Reason COVID-19*  Social distancing based on CDC and AMA recommendations.   I contacted Margo A Rosier on 02/09/2024 via telephone.      I clearly identified myself as Cephus Collin, MD. I verified that I was speaking with the correct person using two identifiers (Name: DAJANAE BROPHY,  and date of birth: July 27, 1956).  Consent I sought verbal advanced consent from Violetta A Angst for virtual visit interactions. I informed Ms. Quin of possible security and privacy concerns, risks, and limitations associated with providing not-in-person medical evaluation and management services. I also informed Ms. Bunnell of the availability of in-person appointments. Finally, I informed her that there would be a charge for the virtual visit and that she could be  personally, fully or partially, financially responsible for it. Ms. Wycoff expressed understanding and agreed to proceed.   Historic Elements   Ms. AHNIKA HANNIBAL is a 68 y.o. year old, female patient evaluated today after our last contact on 12/10/2023. Ms. Asbill  has a past medical history of Anxiety, Asthma, Breast mass, Cervical disc disease, Chronic pain syndrome, COPD (chronic obstructive pulmonary disease) (HCC), Degenerative disc disease, lumbar, Depression, Diabetes mellitus without complication (HCC), Fibromyalgia, Fibromyalgia, GERD (gastroesophageal reflux disease), Glaucoma, Graves disease, Headache, Hemorrhoids, History of hiatal hernia, Hyperlipidemia, Hypertension, Hyperthyroidism, Lumbar disc disease, Obesity, Pre-diabetes, Psoriatic arthritis (HCC), and Thyroid  disease. She also  has a past surgical history that includes carpel tunn (Right); Hand surgery (Right); carpel tunnel (Left); Cesarean section; Shoulder surgery (Right); Back surgery; Neck surgery; Total hip arthroplasty (Right); Excision Morton's neuroma (Left, 02/05/2017); Colonoscopy; Joint replacement; Esophagogastroduodenoscopy (egd) with propofol  (N/A, 06/26/2017); Knee Arthroplasty (Left, 08/11/2017); Knee arthroscopy (Right, 07/02/2018); Reverse shoulder arthroplasty (Left, 06/24/2019); Thoracic laminectomy for spinal cord stimulator (N/A, 08/07/2020); Colonoscopy with propofol  (N/A, 10/20/2020); Cardiac catheterization (2006); Tubal ligation; Knee Arthroplasty  (Right, 02/05/2021); Spinal cord stimulator removal (N/A, 04/09/2021); Cataract extraction (Bilateral, 2022); Fracture surgery (1969); Lumbar laminectomy/decompression microdiscectomy (N/A, 06/19/2022); and Dilatation & curettage/hysteroscopy with myosure (N/A, 10/24/2022). Ms. Oneil has a current medication list which includes the following prescription(s): acetaminophen , albuterol , amlodipine, atorvastatin , budesonide-formoterol , calcium  carbonate, celecoxib , vitamin d3  super strength, clobetasol , clobetasol  cream, clonazepam , duloxetine , duloxetine , esomeprazole, fluocinolone  acetonide body, fluticasone , gabapentin , hydrocodone -acetaminophen , ibandronate , ketoconazole , ketoconazole , levocetirizine, linaclotide , losartan , methimazole , methocarbamol , mounjaro, multivitamin with minerals, naloxone  hcl, potassium chloride , pregabalin, propranolol , sucralfate , terbinafine , torsemide , and trazodone . She  reports that she quit smoking about 31 years ago. Her smoking use included cigarettes. She has never used smokeless tobacco. She reports that she does not currently use drugs after having used the following drugs: Crack cocaine and Marijuana. She reports that she does not drink alcohol . Ms. Arcidiacono is allergic to cephalexin , ibuprofen, potassium chloride , shellfish allergy , and aspirin.  BMI: Estimated body mass index is 32.64 kg/m as calculated from the following:   Height as of 01/25/24: 5' 11 (1.803 m).   Weight as of 01/25/24: 234 lb (106.1 kg). Last encounter: 11/06/2023. Last procedure: 12/10/2023.  HPI  Today, she is being contacted for a post-procedure assessment.  Post-Procedure Evaluation   Procedure: Sacroiliac Joint Steroid Injection #1    Laterality: Bilateral     Level: PIIS (Posterior Inferior Iliac Spine)  Target: Interarticular sacroiliac joint. Location: Medial to the postero-medial edge of iliac spine. Region: Lumbosacral-sacrococcygeal. Approach: Inferior postero-medial percutaneous  approach. Type of procedure: Percutaneous joint injection.  Imaging: Fluoroscopy-guided Non-spinal ([id number]) Anesthesia: Local anesthesia (1-2% Lidocaine ) Sedation: Minimal Sedation                       DOS: 12/10/2023  Performed by: Cephus Collin, MD  Purpose: Diagnostic/Therapeutic Indications: Sacroiliac joint pain in the lower back and hip area severe enough to impact quality of life or function. Rationale (medical necessity): procedure needed and proper for the diagnosis and/or treatment of Ms. Debellis's medical symptoms and needs. Bilateral SI joint arthritis, SI joint pain and bilateral piriformis syndrome NAS-11 Pain score:   Pre-procedure: 9 /10   Post-procedure: 0-No pain/10     Effectiveness:  Initial hour after procedure: 100 %  Subsequent 4-6 hours post-procedure: 100 %  Analgesia past initial 6 hours: 60% for 7 weeks and then gradually returned back to pre-block levels Ongoing improvement:  Analgesic:  back to baseline Function: Back to baseline   Pharmacotherapy Assessment Opioid Analgesic:Tramadol  100 mg twice daily, quantity 120/month MME equals 20  Monitoring: [place] PMP: PDMP not reviewed this encounter.       Pharmacotherapy: No side-effects or adverse reactions reported. Compliance: No problems identified. Effectiveness: Clinically acceptable. Plan: Refer to POC.  UDS:  Summary  Date Value Ref Range Status  11/15/2021 Note  Final    Comment:    ==================================================================== [employer] Select 13 (MW) ==================================================================== Test                             Result       Flag       Units  Drug Present and Declared for Prescription Verification   7-aminoclonazepam              42           EXPECTED   ng/mg creat    7-aminoclonazepam is an expected metabolite of clonazepam . Source of    clonazepam  is a scheduled prescription medication.    Tramadol                         >6944        EXPECTED   ng/mg creat   O-Desmethyltramadol            5594  EXPECTED   ng/mg creat   N-Desmethyltramadol            >6944        EXPECTED   ng/mg creat    Source of tramadol  is a prescription medication. O-desmethyltramadol    and N-desmethyltramadol are expected metabolites of tramadol .  ==================================================================== Test                      Result    Flag   Units      Ref Range   Creatinine              72               mg/dL      >=62 ==================================================================== Declared Medications:  The flagging and interpretation on this report are based on the  following declared medications.  Unexpected results may arise from  inaccuracies in the declared medications.   **Note: The testing scope of this panel includes these medications:   Clonazepam  (Klonopin )  Tramadol  (Ultram )   **Note: The testing scope of this panel does not include the  following reported medications:   Acetaminophen  (Tylenol )  Atorvastatin  (Lipitor)  Budesonide (Symbicort)  Calcium   Celecoxib  (Celebrex )  Ciclopirox  (Loprox )  Clobetasol  (Temovate )  Diphenhydramine  (Benadryl )  Duloxetine  (Cymbalta )  Esomeprazole (Nexium)  Fexofenadine (Allegra)  Fluticasone  (Flonase )  Formoterol  (Symbicort)  Gabapentin  (Neurontin )  Hydralazine  (Apresoline )  Hydrochlorothiazide  (Hyzaar)  Ketoconazole  (Nizoral )  Linaclotide  (Linzess )  Losartan  (Hyzaar)  Methimazole  (Tapazole )  Methocarbamol  (Robaxin )  Multivitamin  Probiotic  Propranolol  (Inderal )  Sucralfate  (Carafate )  Terbinafine  (Lamisil )  Trazodone  (Desyrel )  Triamcinolone  (Kenalog )  Turmeric  Ubiquinone (CoQ10)  Vitamin A  Vitamin B12  Vitamin C   Vitamin D3  Vitamin E  ==================================================================== For clinical consultation, please call [phone]. ====================================================================    No results found for: CBDTHCR, D8THCCBX, D9THCCBX  Laboratory Chemistry Profile   Renal Lab Results  Component Value Date   BUN 8 09/09/2023   CREATININE 0.67 09/09/2023   BCR 11 (L) 04/27/2020   GFRAA 83 04/27/2020   GFRNONAA >60 09/09/2023    Hepatic Lab Results  Component Value Date   AST 21 09/09/2023   ALT 23 09/09/2023   ALBUMIN 4.5 09/09/2023   ALKPHOS 88 09/09/2023   HCVAB NON REACTIVE 02/05/2023   LIPASE 27 12/14/2016    Electrolytes Lab Results  Component Value Date   NA 131 (L) 09/09/2023   K 4.4 09/09/2023   CL 99 09/09/2023   CALCIUM  9.7 09/09/2023   MG 1.9 08/27/2014    Bone No results found for: VD25OH, VD125OH2TOT, KG4010UV2, ZD6644IH4, 25OHVITD1, 25OHVITD2, 25OHVITD3, TESTOFREE, TESTOSTERONE  Inflammation (CRP: Acute Phase) (ESR: Chronic Phase) Lab Results  Component Value Date   CRP <0.5 02/05/2023   ESRSEDRATE 6 01/25/2021         Note: Above Lab results reviewed.  Imaging  DG Wrist Complete Left CLINICAL DATA:  Pain after fall.  EXAM: LEFT WRIST - COMPLETE 3+ VIEW  COMPARISON:  None Available.  FINDINGS: There is no evidence of fracture or dislocation. Corticated density adjacent to the ulna styloid may represent an accessory ossicle or sequela of remote injury. Ulna minus variance. Mild degenerative spurring of the radiocarpal joint. No erosions. Chondrocalcinosis of the triangular fibrocartilage. Mild generalized soft tissue edema.  IMPRESSION: 1. No acute fracture or subluxation of the left wrist. 2. Mild degenerative spurring of the radiocarpal joint. 3. Chondrocalcinosis of the triangular fibrocartilage.  Electronically Signed   By: Alvina Axon.D.  On: 01/25/2024 16:21 CT Lumbar Spine Wo Contrast EXAM: CT OF THE LUMBAR SPINE WITHOUT CONTRAST 01/25/2024 03:07:01 PM  TECHNIQUE: CT of the lumbar spine was  performed without the administration of intravenous contrast. Multiplanar reformatted images are provided for review. Automated exposure control, iterative reconstruction, and/or weight based adjustment of the mA/kV was utilized to reduce the radiation dose to as low as reasonably achievable.  COMPARISON: Lumbar spine CT myelogram dated 02/04/2023.  CLINICAL HISTORY: Lumbar spine pain, fall. Arrived via EMS from home due to a fall. Pt fell back and landed on her back. Pt had no LOC or blood thinners.  FINDINGS:  BONES AND ALIGNMENT: No acute fracture of the lumbar spine. Unchanged postoperative appearance from prior L3-L4 laminectomy, interbody and posterior spinal fusion. Hardware is intact. No associated lucency. Partially imaged right total hip arthroplasty.  DEGENERATIVE CHANGES: Moderate degenerative changes of the bilateral sacroiliac joints. Disc bulge and facet arthropathy contribute to mild spinal canal stenosis and moderate-to-severe bilateral neural foraminal narrowing at L4-5.  SOFT TISSUES: Atherosclerotic calcifications of the abdominal aorta and its branches.  IMPRESSION: 1. No acute fracture of the lumbar spine. 2. Unchanged postoperative appearance from prior L3-L4 laminectomy, interbody, and posterior spinal fusion. Hardware is intact. No associated lucency. 3. Disc bulge and facet arthropathy at L4-5 contribute to mild spinal canal stenosis and moderate-to-severe bilateral neural foraminal narrowing.  Electronically signed by: Audra Blend MD 01/25/2024 04:03 PM EDT RP Workstation: [id number] CT Cervical Spine Wo Contrast CLINICAL DATA:  Fall, head injury  EXAM: CT HEAD WITHOUT CONTRAST  CT CERVICAL SPINE WITHOUT CONTRAST  TECHNIQUE: Multidetector CT imaging of the head and cervical spine was performed following the standard protocol without intravenous contrast. Multiplanar CT image reconstructions of the cervical spine were also  generated.  RADIATION DOSE REDUCTION: This exam was performed according to the departmental dose-optimization program which includes automated exposure control, adjustment of the mA and/or kV according to patient size and/or use of iterative reconstruction technique.  COMPARISON:  09/19/2022  FINDINGS: CT HEAD FINDINGS  Brain: No evidence of acute infarction, hemorrhage, hydrocephalus, extra-axial collection or mass lesion/mass effect. Periventricular and deep white matter hypodensity.  Vascular: No hyperdense vessel or unexpected calcification.  Skull: Normal. Negative for fracture or focal lesion.  Sinuses/Orbits: No acute finding.  Other: None.  CT CERVICAL SPINE FINDINGS  Alignment: Straightening of the normal cervical lordosis.  Skull base and vertebrae: No acute fracture. No primary bone lesion or focal pathologic process.  Soft tissues and spinal canal: No prevertebral fluid or swelling. No visible canal hematoma.  Disc levels: Anterior cervical discectomy and fusion of C5-C6 moderate disc space height loss and osteophytosis of C6-C7.  Upper chest: Negative.  Other: None.  IMPRESSION: 1. No acute intracranial pathology. Small-vessel white matter disease. 2. No fracture or static subluxation of the cervical spine. 3. Anterior cervical discectomy and fusion of C5-C6. Moderate disc space height loss and osteophytosis of C6-C7.  Electronically Signed   By: Fredricka Jenny M.D.   On: 01/25/2024 15:11 CT Head Wo Contrast CLINICAL DATA:  Fall, head injury  EXAM: CT HEAD WITHOUT CONTRAST  CT CERVICAL SPINE WITHOUT CONTRAST  TECHNIQUE: Multidetector CT imaging of the head and cervical spine was performed following the standard protocol without intravenous contrast. Multiplanar CT image reconstructions of the cervical spine were also generated.  RADIATION DOSE REDUCTION: This exam was performed according to the departmental dose-optimization program which  includes automated exposure control, adjustment of the mA and/or kV according to patient size and/or  use of iterative reconstruction technique.  COMPARISON:  09/19/2022  FINDINGS: CT HEAD FINDINGS  Brain: No evidence of acute infarction, hemorrhage, hydrocephalus, extra-axial collection or mass lesion/mass effect. Periventricular and deep white matter hypodensity.  Vascular: No hyperdense vessel or unexpected calcification.  Skull: Normal. Negative for fracture or focal lesion.  Sinuses/Orbits: No acute finding.  Other: None.  CT CERVICAL SPINE FINDINGS  Alignment: Straightening of the normal cervical lordosis.  Skull base and vertebrae: No acute fracture. No primary bone lesion or focal pathologic process.  Soft tissues and spinal canal: No prevertebral fluid or swelling. No visible canal hematoma.  Disc levels: Anterior cervical discectomy and fusion of C5-C6 moderate disc space height loss and osteophytosis of C6-C7.  Upper chest: Negative.  Other: None.  IMPRESSION: 1. No acute intracranial pathology. Small-vessel white matter disease. 2. No fracture or static subluxation of the cervical spine. 3. Anterior cervical discectomy and fusion of C5-C6. Moderate disc space height loss and osteophytosis of C6-C7.  Electronically Signed   By: Fredricka Jenny M.D.   On: 01/25/2024 15:11  Assessment  The primary encounter diagnosis was SI joint arthritis (HCC). Diagnoses of Sacroiliac joint pain, Piriformis syndrome of both sides, and Chronic pain syndrome were also pertinent to this visit.  Plan of Care  1. SI joint arthritis (HCC) (Primary) - SACROILIAC JOINT INJECTION; Future  2. Sacroiliac joint pain - SACROILIAC JOINT INJECTION; Future  3. Piriformis syndrome of both sides  4. Chronic pain syndrome  Repeat bilateral SI joint injection and piriformis injection in approximately 4 weeks.  Orders:  Orders Placed This Encounter  Procedures   SACROILIAC JOINT  INJECTION    Physical Examination Findings: Positive Sacral Thrust (Sacral Spring, Downward Pressure): (Y) Positive FABER maneuver (Patrick's): (Y) Positive SI distraction (Gapping): (Y) Positive SI compression (Approximation): (Y) Positive Thigh Thrust:  (Y) Positive Gaenslen's: (Y) Positive Sacral Sulcus Tenderness: (Y)    Standing Status:   Future    Expected Date:   03/10/2024    Expiration Date:   02/08/2025    Scheduling Instructions:     Procedure: Sacroiliac Joint Injection     Side  Laterality: Bilateral     Sedation: Patient's choice.     Timeframe: As soon as schedule allows.    Where will this procedure be performed?:   [facility]   Follow-up plan:   Return in about 4 weeks (around 03/08/2024) for B/L SIJ + Piriformis TPI, in clinic [facility].      Recent Visits Date Type Provider Dept  12/10/23 Procedure visit Cephus Collin, MD [facility]  Showing recent visits within past 90 days and meeting all other requirements Today's Visits Date Type Provider Dept  02/09/24 Office Visit Cephus Collin, MD [facility]  Showing today's visits and meeting all other requirements Future Appointments No visits were found meeting these conditions. Showing future appointments within next 90 days and meeting all other requirements  I discussed the assessment and treatment plan with the patient. The patient was provided an opportunity to ask questions and all were answered. The patient agreed with the plan and demonstrated an understanding of the instructions.  Patient advised to call back or seek an in-person evaluation if the symptoms or condition worsens.  Duration of encounter: .  Note by: Cephus Collin, MD Date: 02/09/2024; Time: 3:59 PM

## 2024-02-10 ENCOUNTER — Ambulatory Visit: Payer: Medicare HMO | Admitting: Dermatology

## 2024-02-10 DIAGNOSIS — L304 Erythema intertrigo: Secondary | ICD-10-CM | POA: Diagnosis not present

## 2024-02-10 DIAGNOSIS — L219 Seborrheic dermatitis, unspecified: Secondary | ICD-10-CM

## 2024-02-10 DIAGNOSIS — R222 Localized swelling, mass and lump, trunk: Secondary | ICD-10-CM | POA: Diagnosis not present

## 2024-02-10 DIAGNOSIS — D179 Benign lipomatous neoplasm, unspecified: Secondary | ICD-10-CM

## 2024-02-10 DIAGNOSIS — L811 Chloasma: Secondary | ICD-10-CM

## 2024-02-10 DIAGNOSIS — B351 Tinea unguium: Secondary | ICD-10-CM | POA: Diagnosis not present

## 2024-02-10 DIAGNOSIS — M7989 Other specified soft tissue disorders: Secondary | ICD-10-CM

## 2024-02-10 DIAGNOSIS — L819 Disorder of pigmentation, unspecified: Secondary | ICD-10-CM | POA: Diagnosis not present

## 2024-02-10 MED ORDER — CICLOPIROX OLAMINE 0.77 % EX CREA: TOPICAL_CREAM | CUTANEOUS | 2 refills | Status: AC

## 2024-02-10 MED ORDER — KETOCONAZOLE 2 % EX SHAM: MEDICATED_SHAMPOO | CUTANEOUS | 11 refills | Status: AC

## 2024-02-10 MED ORDER — CLOBETASOL PROPIONATE 0.05 % EX SOLN: CUTANEOUS | 2 refills | Status: DC

## 2024-02-10 MED ORDER — PIMECROLIMUS 1 % EX CREA: TOPICAL_CREAM | CUTANEOUS | 3 refills | Status: AC

## 2024-02-10 MED ORDER — FLUCONAZOLE 200 MG PO TABS: 200.0000 mg | ORAL_TABLET | Freq: Every day | ORAL | 0 refills | Status: DC

## 2024-02-10 MED ORDER — CICLOPIROX OLAMINE 0.77 % EX SUSP: CUTANEOUS | 3 refills | Status: AC

## 2024-02-10 NOTE — Patient Instructions (Addendum)
 Start Fluconazole  200 mg - take 1 pill once a week. Prescription will be written once daily. Side effects of fluconazole  (diflucan ) include nausea, diarrhea, headache, dizziness, taste changes, rare risk of irritation of the liver, allergy , or decreased blood counts (which could show up as infection or tiredness).    Pre-Operative Instructions  You are scheduled for a surgical procedure at [facility]. We recommend you read the following instructions. If you have any questions or concerns, please call the office at [phone].  Shower and wash the entire body with soap and water the day of your surgery paying special attention to cleansing at and around the planned surgery site.  Avoid aspirin or aspirin containing products at least fourteen (14) days prior to your surgical procedure and for at least one week (7 Days) after your surgical procedure. If you take aspirin on a regular basis for heart disease or history of stroke or for any other reason, we may recommend you continue taking aspirin but please notify us  if you take this on a regular basis. Aspirin can cause more bleeding to occur during surgery as well as prolonged bleeding and bruising after surgery.   Avoid other nonsteroidal pain medications at least one week prior to surgery and at least one week prior to your surgery. These include medications such as Ibuprofen (Motrin, Advil and Nuprin), Naprosyn, Voltaren, Relafen, etc. If medications are used for therapeutic reasons, please inform us  as they can cause increased bleeding or prolonged bleeding during and bruising after surgical procedures.   Please advise us  if you are taking any blood thinner medications such as Coumadin or Dipyridamole or Plavix or similar medications. These cause increased bleeding and prolonged bleeding during procedures and bruising after surgical procedures. We may have to consider discontinuing these medications briefly prior to and shortly after  your surgery if safe to do so.   Please inform us  of all medications you are currently taking. All medications that are taken regularly should be taken the day of surgery as you always do. Nevertheless, we need to be informed of what medications you are taking prior to surgery to know whether they will affect the procedure or cause any complications.   Please inform us  of any medication allergies. Also inform us  of whether you have allergies to Latex or rubber products or whether you have had any adverse reaction to Lidocaine  or Epinephrine .  Please inform us  of any prosthetic or artificial body parts such as artificial heart valve, joint replacements, etc., or similar condition that might require preoperative antibiotics.   We recommend avoidance of alcohol  at least two weeks prior to surgery and continued avoidance for at least two weeks after surgery.   We recommend discontinuation of tobacco smoking at least two weeks prior to surgery and continued abstinence for at least two weeks after surgery.  Do not plan strenuous exercise, strenuous work or strenuous lifting for approximately four weeks after your surgery.   We request if you are unable to make your scheduled surgical appointment, please call us  at least a week in advance or as soon as you are aware of a problem so that we can cancel or reschedule the appointment.   You MAY TAKE TYLENOL  (acetaminophen ) for pain as it is not a blood thinner.   PLEASE PLAN TO BE IN TOWN FOR TWO WEEKS FOLLOWING SURGERY, THIS IS IMPORTANT SO YOU CAN BE CHECKED FOR DRESSING CHANGES, SUTURE REMOVAL AND TO MONITOR FOR POSSIBLE COMPLICATIONS.    Due to  recent changes in healthcare laws, you may see results of your pathology and/or laboratory studies on MyChart before the doctors have had a chance to review them. We understand that in some cases there may be results that are confusing or concerning to you. Please understand that not all results are received at  the same time and often the doctors may need to interpret multiple results in order to provide you with the best plan of care or course of treatment. Therefore, we ask that you please give us  2 business days to thoroughly review all your results before contacting the office for clarification. Should we see a critical lab result, you will be contacted sooner.   If You Need Anything After Your Visit  If you have any questions or concerns for your doctor, please call our main line at [phone] and press option 4 to reach your doctor's medical assistant. If no one answers, please leave a voicemail as directed and we will return your call as soon as possible. Messages left after 4 pm will be answered the following business day.   You may also send us  a message via MyChart. We typically respond to MyChart messages within 1-2 business days.  For prescription refills, please ask your pharmacy to contact our office. Our fax number is [phone].  If you have an urgent issue when the clinic is closed that cannot wait until the next business day, you can page your doctor at the number below.    Please note that while we do our best to be available for urgent issues outside of office hours, we are not available 24/7.   If you have an urgent issue and are unable to reach us , you may choose to seek medical care at your doctor's office, retail clinic, urgent care center, or emergency room.  If you have a medical emergency, please immediately call 911 or go to the emergency department.  Pager Numbers  - Dr. Bary Likes: [phone]  - Dr. Annette Barters: [phone]  - Dr. Felipe Horton: [phone]   In the event of inclement weather, please call our main line at [phone] for an update on the status of any delays or closures.  Dermatology Medication Tips: Please keep the boxes that topical medications come in in order to help keep track of the instructions about where and how to use these. Pharmacies  typically print the medication instructions only on the boxes and not directly on the medication tubes.   If your medication is too expensive, please contact our office at [phone] option 4 or send us  a message through MyChart.   We are unable to tell what your co-pay for medications will be in advance as this is different depending on your insurance coverage. However, we may be able to find a substitute medication at lower cost or fill out paperwork to get insurance to cover a needed medication.   If a prior authorization is required to get your medication covered by your insurance company, please allow us  1-2 business days to complete this process.  Drug prices often vary depending on where the prescription is filled and some pharmacies may offer cheaper prices.  The website www.goodrx.com contains coupons for medications through different pharmacies. The prices here do not account for what the cost may be with help from insurance (it may be cheaper with your insurance), but the website can give you the price if you did not use any insurance.  - You can print the associated coupon and take it with  your prescription to the pharmacy.  - You may also stop by our office during regular business hours and pick up a GoodRx coupon card.  - If you need your prescription sent electronically to a different pharmacy, notify our office through [facility] or by phone at [phone] option 4.     Si Usted Necesita Algo Despus de Su Visita  Tambin puede enviarnos un mensaje a travs de Clinical cytogeneticist. Por lo general respondemos a los mensajes de MyChart en el transcurso de 1 a 2 das hbiles.  Para renovar recetas, por favor pida a su farmacia que se ponga en contacto con nuestra oficina. Franz Jacks de fax es [place] [phone].  Si tiene un asunto urgente cuando la clnica est cerrada y que no puede esperar hasta el siguiente da hbil, puede llamar/localizar a su doctor(a) al nmero que  aparece a continuacin.   Por favor, tenga en cuenta que aunque hacemos todo lo posible para estar disponibles para asuntos urgentes fuera del horario de [place], no estamos disponibles las 24 horas del da, los 7 [address]  [address] de la [place].   Si tiene un problema urgente y no puede comunicarse con nosotros, puede optar por buscar atencin mdica  en el consultorio de su doctor(a), en una clnica privada, en un centro de atencin urgente o en una sala de emergencias.  Si tiene Engineer, drilling, por favor llame inmediatamente al 911 o vaya a la sala de emergencias.  Nmeros de bper  - Dr. Bary Likes: [phone]  - Dra. Annette Barters: [phone]  - Dr. Felipe Horton: [phone]   En caso de inclemencias del tiempo, por favor llame a Lajuan Pila principal al [phone] para una actualizacin sobre el [place] de cualquier retraso o cierre.  Consejos para la medicacin en dermatologa: Por favor, guarde las cajas en las que vienen los medicamentos de uso tpico para ayudarle a seguir las instrucciones sobre dnde y cmo usarlos. Las farmacias generalmente imprimen las instrucciones del medicamento slo en las cajas y no directamente en los tubos del [place].   Si su medicamento es muy caro, por favor, pngase en contacto con Bettyjane Brunet llamando al [phone] y presione la opcin 4 o envenos un mensaje a travs de Clinical cytogeneticist.   No podemos decirle cul ser su copago por los medicamentos por adelantado ya que esto es diferente dependiendo de la cobertura de su seguro. Sin embargo, es posible que podamos encontrar un medicamento sustituto a Audiological scientist un formulario para que el seguro cubra el medicamento que se considera necesario.   Si se requiere una autorizacin previa para que su compaa de seguros [place] su medicamento, por favor permtanos de 1 a 2 das hbiles para completar este proceso.  Los precios de los medicamentos varan con frecuencia dependiendo del Environmental consultant de dnde se surte  la receta y alguna farmacias pueden ofrecer precios ms baratos.  El sitio web www.goodrx.com tiene cupones para medicamentos de Health and safety inspector. Los precios aqu no tienen en cuenta lo que podra costar con la ayuda del seguro (puede ser ms barato con su seguro), pero el sitio web puede darle el precio si no utiliz Tourist information centre manager.  - Puede imprimir el cupn correspondiente y llevarlo con su receta a la farmacia.  - Tambin puede pasar por nuestra oficina durante el horario de atencin regular y Education officer, museum una tarjeta de cupones de GoodRx.  - Si necesita que su receta se enve electrnicamente a Psychiatrist, informe a nuestra oficina a travs de MyChart de [employer] [facility] o por telfono llamando al [phone] y presione la opcin 4.

## 2024-02-10 NOTE — Progress Notes (Signed)
 Follow-Up Visit   Subjective  Christine Higgins is a 68 y.o. female who presents for the following: Knots on the arms, chest, legs - present for years, some growing. She has intertrigo of the inframammary and uses clotrimazole -betamethasone  cream - not improved with ketoconazole  cream. She has seb derm vs mild psoriasis and ran out of ketoconazole  2% shampoo and clobetasol  solution.   The following portions of the chart were reviewed this encounter and updated as appropriate: medications, allergies, medical history  Review of Systems:  No other skin or systemic complaints except as noted in HPI or Assessment and Plan.  Objective  Well appearing patient in no apparent distress; mood and affect are within normal limits.  A focused examination was performed of the following areas: Face, arms, chest  Relevant physical exam findings are noted in the Assessment and Plan.    Assessment & Plan  Probable Lipoma vs Cysts Exam: Subcutaneous rubbery nodules at left antecubitum, intermammary chest, upper breast, right ant axilla, right lower axilla - all about 1 cm;  larger more firm SQ nodule at left lower back with overlying scar ~ 3 cm (Vs scar tissue)   Benign-appearing. Exam most consistent with a lipoma. Discussed that a lipoma is a benign fatty growth that can grow over time and sometimes get irritated. Recommend observation if it is not bothersome or changing. Discussed option of ILK injections or surgical excision to remove it if it is growing, symptomatic, or other changes noted. Please call for new or changing lesions so they can be evaluated. Patient would like to schedule excision of 1 nodule (right antecubitum) to confirm diagnosis.  SEBORRHEIC DERMATITIS VS MILD PSORIASIS Exam: scalp clear today, pt c/o itching  Chronic and persistent condition with duration or expected duration over one year. Condition is symptomatic/ bothersome to patient. Not currently at goal.   Seborrheic  Dermatitis is a chronic persistent rash characterized by pinkness and scaling most commonly of the mid face but also can occur on the scalp (dandruff), ears; mid chest, mid back and groin.  It tends to be exacerbated by stress and cooler weather.  People who have neurologic disease may experience new onset or exacerbation of existing seborrheic dermatitis.  The condition is not curable but treatable and can be controlled.  Treatment Plan: Restart ketoconazole  2% shampoo to scalp 1x/wk, let sit 10 minutes before rinsing. 1 yr rfs sent to [facility] Clobetasol  solution 1-2 drops to AA scalp every day/bid prn itch. Avoid face, groin, axilla.    INTERTRIGO Exam: Hyperpigmentation at inframammary area.  Chronic and persistent condition with duration or expected duration over one year. Condition is symptomatic/ bothersome to patient. Not currently at goal.   Intertrigo is a chronic recurrent rash that occurs in skin fold areas that may be associated with friction; heat; moisture; yeast; fungus; and bacteria.  It is exacerbated by increased movement / activity; sweating; and higher atmospheric temperature.  Use of an absorbant powder such as Zeasorb AF powder or other OTC antifungal powder to the area daily can prevent rash recurrence. Other options to help keep the area dry include blow drying the area after bathing or using antiperspirant products such as Duradry sweat minimizing gel.  Treatment Plan: d/c clotrimazole -betamethasone  due to risk skin thinning/striae inframammary area  Start pimecrolimus cream apply once to twice daily to aa rash inframammary - if not covered by insurance, will send in hydrocortisone  2.5% cream.   Start Ciclopirox  cream apply once to twice  daily to aa rash inframammary.   ONYCHOMYCOSIS Exam: nails with thickening and subungual debris, nail polish on today.   Chronic and persistent condition with duration or expected duration over one year.  Condition is bothersome/symptomatic for patient. Currently flared.  Has taken terbinafine  in the past with some improvement but not clearance.  Treatment Plan: LFTs normal 09/09/2023 Start fluconazole  200 mg take 1 po qwk (Rx written 1 po every day dsp #30 0Rf) Side effects of fluconazole  (diflucan ) include nausea, diarrhea, headache, dizziness, taste changes, rare risk of irritation of the liver, allergy , or decreased blood counts (which could show up as infection or tiredness).  Restart ciclopirox  solution/lacquer apply at bedtime to toenails, remove every week.   Discussed treatment will likely last a year to see some results, but due to extensive involvement, likely not to clear completely.  MELASMA Exam: reticulated hyperpigmented patches at face- cheeks and upper lip  Chronic and persistent condition with duration or expected duration over one year. Condition is bothersome/symptomatic for patient. Currently flared.    Melasma is a chronic; persistent condition of hyperpigmented patches generally on the face, worse in summer due to higher UV exposure.    Heredity; thyroid  disease; sun exposure; pregnancy; birth control pills; epilepsy medication and darker skin may predispose to Melasma.   Recommendations include: - Sun avoidance and daily broad spectrum (UVA/UVB) tinted mineral sunscreen SPF 30+, with Zinc  or Titanium Dioxide. - Rx topical bleaching creams (i.e. hydroquinone) is a common treatment but should not be used long term.  Hydroquinones may be mixed with retinoids; vitamin C ; steroids; Kojic Acid. - Alastin A-luminate, retinoids, vitamin C , topical tranexamic acid , glycolic acid and kojic acid can be used for brightening while on break from hydroquinone - Rx Azelaic Acid is also a treatment option that is safe for pregnancy (Category B). - OTC Heliocare can be helpful in control and prevention. - Oral Rx with Tranexamic Acid  250 mg - 650 mg po daily can be used for moderate to  severe cases especially during summer (contraindications include pregnancy; lactation; hx of PE; hx of DVT; clotting disorder; heart disease; anticoagulant use and upcoming long trips)   - Chemical peels (would need multiple for best result).  - Lasers and  Microdermabrasion may also be helpful adjunct treatments.  Treatment Plan:  Discussed topical HQ cream, not covered by insurance.  Pt prefers to try OTC dark spot remover at this time. Recommend daily sunscreen- sample Neutrogena deep tint mineral sunscreen given.  SEBORRHEIC DERMATITIS   Related Medications Fluocinolone  Acetonide Body 0.01 % OIL Apply to scalp twice daily as needed for itch. clobetasol  (TEMOVATE ) 0.05 % external solution Apply 1-2 drops to affected area scalp as needed for itch. Avoid face, groin, axilla. ketoconazole  (NIZORAL ) 2 % shampoo Massage into scalp and let sit 10-15 mins before rinsing. Use 1x/week. ONYCHOMYCOSIS   Related Medications terbinafine  (LAMISIL ) 250 MG tablet Take 1 tablet (250 mg total) by mouth daily. Take with food. ciclopirox  (LOPROX ) 0.77 % SUSP Apply to toenails nightly, remove once a week with alcohol . fluconazole  (DIFLUCAN ) 200 MG tablet Take 1 tablet (200 mg total) by mouth daily. ERYTHEMA INTERTRIGO   Related Medications ciclopirox  (LOPROX ) 0.77 % cream Apply to rash under breasts once to twice daily as needed. pimecrolimus (ELIDEL) 1 % cream Apply to affected areas rash under breasts once to twice daily as needed.   Return next available exicison, for lipoma vs cyst at left antecubitum. Dr Agapito Alcide f/u in 6 mos.Cheyenne Cotta, CMA,  am acting as scribe for Artemio Larry, MD .   Documentation: I have reviewed the above documentation for accuracy and completeness, and I agree with the above.  Artemio Larry, MD

## 2024-02-12 ENCOUNTER — Other Ambulatory Visit: Payer: Self-pay

## 2024-02-12 DIAGNOSIS — K581 Irritable bowel syndrome with constipation: Secondary | ICD-10-CM | POA: Diagnosis not present

## 2024-02-12 DIAGNOSIS — K219 Gastro-esophageal reflux disease without esophagitis: Secondary | ICD-10-CM | POA: Diagnosis not present

## 2024-02-12 MED ORDER — HYDROCORTISONE 2.5 % EX CREA: TOPICAL_CREAM | CUTANEOUS | 5 refills | Status: AC

## 2024-02-12 NOTE — Progress Notes (Signed)
 Pimecrolimus cream not covered by insurance, HC 2.5% cream sent as replacement.

## 2024-03-08 DIAGNOSIS — L4050 Arthropathic psoriasis, unspecified: Secondary | ICD-10-CM | POA: Diagnosis not present

## 2024-03-08 DIAGNOSIS — M150 Primary generalized (osteo)arthritis: Secondary | ICD-10-CM | POA: Diagnosis not present

## 2024-03-08 DIAGNOSIS — I10 Essential (primary) hypertension: Secondary | ICD-10-CM | POA: Diagnosis not present

## 2024-03-08 DIAGNOSIS — E0500 Thyrotoxicosis with diffuse goiter without thyrotoxic crisis or storm: Secondary | ICD-10-CM | POA: Diagnosis not present

## 2024-03-08 DIAGNOSIS — L409 Psoriasis, unspecified: Secondary | ICD-10-CM | POA: Diagnosis not present

## 2024-03-08 DIAGNOSIS — E118 Type 2 diabetes mellitus with unspecified complications: Secondary | ICD-10-CM | POA: Diagnosis not present

## 2024-03-08 DIAGNOSIS — M797 Fibromyalgia: Secondary | ICD-10-CM | POA: Diagnosis not present

## 2024-03-08 DIAGNOSIS — E7800 Pure hypercholesterolemia, unspecified: Secondary | ICD-10-CM | POA: Diagnosis not present

## 2024-03-08 DIAGNOSIS — Z79899 Other long term (current) drug therapy: Secondary | ICD-10-CM | POA: Diagnosis not present

## 2024-03-10 ENCOUNTER — Inpatient Hospital Stay (HOSPITAL_BASED_OUTPATIENT_CLINIC_OR_DEPARTMENT_OTHER): Payer: Medicare HMO | Admitting: Internal Medicine

## 2024-03-10 ENCOUNTER — Inpatient Hospital Stay: Payer: Medicare HMO | Attending: Internal Medicine

## 2024-03-10 ENCOUNTER — Encounter: Payer: Self-pay | Admitting: Internal Medicine

## 2024-03-10 VITALS — BP 135/72 | HR 79 | Temp 97.9°F | Resp 16 | Ht 71.0 in | Wt 239.0 lb

## 2024-03-10 DIAGNOSIS — D708 Other neutropenia: Secondary | ICD-10-CM | POA: Insufficient documentation

## 2024-03-10 DIAGNOSIS — Z79899 Other long term (current) drug therapy: Secondary | ICD-10-CM | POA: Insufficient documentation

## 2024-03-10 DIAGNOSIS — R296 Repeated falls: Secondary | ICD-10-CM | POA: Insufficient documentation

## 2024-03-10 LAB — CMP (CANCER CENTER ONLY)
ALT: 25 U/L (ref 0–44)
AST: 22 U/L (ref 15–41)
Albumin: 4.7 g/dL (ref 3.5–5.0)
Alkaline Phosphatase: 101 U/L (ref 38–126)
Anion gap: 6 (ref 5–15)
BUN: 12 mg/dL (ref 8–23)
CO2: 24 mmol/L (ref 22–32)
Calcium: 9.2 mg/dL (ref 8.9–10.3)
Chloride: 102 mmol/L (ref 98–111)
Creatinine: 0.59 mg/dL (ref 0.44–1.00)
GFR, Estimated: >60 mL/min (ref >60)
Glucose, Bld: 106 mg/dL — ABNORMAL HIGH (ref 70–99)
Potassium: 4.6 mmol/L (ref 3.5–5.1)
Sodium: 132 mmol/L — ABNORMAL LOW (ref 135–145)
Total Bilirubin: 0.8 mg/dL (ref 0.0–1.2)
Total Protein: 7.5 g/dL (ref 6.5–8.1)

## 2024-03-10 LAB — CBC WITH DIFFERENTIAL (CANCER CENTER ONLY)
Abs Immature Granulocytes: 0.01 K/uL (ref 0.00–0.07)
Basophils Absolute: 0 K/uL (ref 0.0–0.1)
Basophils Relative: 0 %
Eosinophils Absolute: 0 K/uL (ref 0.0–0.5)
Eosinophils Relative: 0 %
HCT: 38.5 % (ref 36.0–46.0)
Hemoglobin: 12.9 g/dL (ref 12.0–15.0)
Immature Granulocytes: 0 %
Lymphocytes Relative: 18 %
Lymphs Abs: 0.7 K/uL (ref 0.7–4.0)
MCH: 28.4 pg (ref 26.0–34.0)
MCHC: 33.5 g/dL (ref 30.0–36.0)
MCV: 84.8 fL (ref 80.0–100.0)
Monocytes Absolute: 0.2 K/uL (ref 0.1–1.0)
Monocytes Relative: 5 %
Neutro Abs: 3 K/uL (ref 1.7–7.7)
Neutrophils Relative %: 77 %
Platelet Count: 217 K/uL (ref 150–400)
RBC: 4.54 MIL/uL (ref 3.87–5.11)
RDW: 13.6 % (ref 11.5–15.5)
WBC Count: 4 K/uL (ref 4.0–10.5)
nRBC: 0 % (ref 0.0–0.2)

## 2024-03-10 NOTE — Progress Notes (Signed)
 [facility] CONSULT NOTE  Patient Care Team: Lenon Layman ORN, MD as PCP - General (Internal Medicine) Rennie Cindy SAUNDERS, MD as Consulting Physician (Oncology)  # CHIEF COMPLAINTS/PURPOSE OF CONSULTATION: Leucopenia  # LEUCOPENIA/NEUTROPENIA- ANC; Hb; platelets; CT Ab/US ; Hepatitis/HIV; Alcohol -none.   #  Oncology History   No history exists.   HISTORY OF PRESENTING ILLNESS: Patient ambulating-with assistance.  Alone.  Christine Higgins 68 y.o.  female with history of psoriatic arthritis fibromyalgia chronic neuropathy chronic pain is here for follow-up of her neutropenia.  Patient has had multiple falls. Hurting from the falls; taking medrol  dose pack for the achiness from her falls. Has an appt with neurology this month. Has an appetite. On mounjar   Appetite is good. Energy is fair.   Review of Systems  Constitutional:  Positive for malaise/fatigue. Negative for chills, diaphoresis, fever and weight loss.  HENT:  Negative for nosebleeds and sore throat.   Eyes:  Negative for double vision.  Respiratory:  Negative for cough, hemoptysis, sputum production, shortness of breath and wheezing.   Cardiovascular:  Negative for chest pain, palpitations, orthopnea and leg swelling.  Gastrointestinal:  Negative for abdominal pain, blood in stool, constipation, diarrhea, heartburn, melena, nausea and vomiting.  Genitourinary:  Negative for dysuria, frequency and urgency.  Musculoskeletal:  Positive for back pain and joint pain.  Skin: Negative.  Negative for itching and rash.  Neurological:  Negative for dizziness, tingling, focal weakness, weakness and headaches.  Endo/Heme/Allergies:  Does not bruise/bleed easily.  Psychiatric/Behavioral:  Negative for depression. The patient is not nervous/anxious and does not have insomnia.      MEDICAL HISTORY:  Past Medical History:  Diagnosis Date   Anxiety    Asthma    Breast mass    LEFT x 3 months per pt and palpated by  physician   Cervical disc disease    Chronic pain syndrome    COPD (chronic obstructive pulmonary disease) (HCC)    Degenerative disc disease, lumbar    osteoarthritis   Depression    Diabetes mellitus without complication (HCC)    Fibromyalgia    Fibromyalgia    GERD (gastroesophageal reflux disease)    Glaucoma    Graves disease    Headache    migraines   Hemorrhoids    History of hiatal hernia    Hyperlipidemia    Hypertension    Hyperthyroidism    Lumbar disc disease    Obesity    Pre-diabetes    Psoriatic arthritis (HCC)    Thyroid  disease     SURGICAL HISTORY: Past Surgical History:  Procedure Laterality Date   BACK SURGERY     sumbar   CARDIAC CATHETERIZATION  2006   carpel tunn Right    carpel tunnel Left    CATARACT EXTRACTION Bilateral 2022   CESAREAN SECTION     COLONOSCOPY     COLONOSCOPY WITH PROPOFOL  N/A 10/20/2020   Procedure: COLONOSCOPY WITH PROPOFOL ;  Surgeon: Maryruth Ole DASEN, MD;  Location: [facility];  Service: Endoscopy;  Laterality: N/A;   DILATATION & CURETTAGE/HYSTEROSCOPY WITH MYOSURE N/A 10/24/2022   Procedure: DILATATION & CURETTAGE/HYSTEROSCOPY;  Surgeon: Schermerhorn, Debby PARAS, MD;  Location: [facility];  Service: Gynecology;  Laterality: N/A;   ESOPHAGOGASTRODUODENOSCOPY (EGD) WITH PROPOFOL  N/A 06/26/2017   Procedure: ESOPHAGOGASTRODUODENOSCOPY (EGD) WITH PROPOFOL ;  Surgeon: Gaylyn Gladis PENNER, MD;  Location: [facility] ENDOSCOPY;  Service: Endoscopy;  Laterality: N/A;   EXCISION MORTON'S NEUROMA Left 02/05/2017   Procedure: EXCISION MORTON'S NEUROMA;  Surgeon: Ashley,  Eva, DPM;  Location: [facility];  Service: Podiatry;  Laterality: Left;  iva with local   FRACTURE SURGERY  1969   Left Elbow   HAND SURGERY Right    scar tissue removal   JOINT REPLACEMENT     right hip arthroplasty 08/25/15   KNEE ARTHROPLASTY Left 08/11/2017   Procedure: COMPUTER ASSISTED TOTAL KNEE ARTHROPLASTY;  Surgeon: Mardee Lynwood SQUIBB, MD;  Location:  [facility] ORS;  Service: Orthopedics;  Laterality: Left;   KNEE ARTHROPLASTY Right 02/05/2021   Procedure: COMPUTER ASSISTED TOTAL KNEE ARTHROPLASTY;  Surgeon: Mardee Lynwood SQUIBB, MD;  Location: [facility]S;  Service: Orthopedics;  Laterality: Right;   KNEE ARTHROSCOPY Right 07/02/2018   Procedure: ARTHROSCOPY KNEE, Medial and Lateral  Chondroplasty;  Surgeon: Mardee Lynwood SQUIBB, MD;  Location: [facility];  Service: Orthopedics;  Laterality: Right;   LUMBAR LAMINECTOMY/DECOMPRESSION MICRODISCECTOMY N/A 06/19/2022   Procedure: EXPLORE LUMBAR WOUND FOR EVACUATION OF HEMATOMA;  Surgeon: Mavis Purchase, MD;  Location: [facility];  Service: Neurosurgery;  Laterality: N/A;   NECK SURGERY     disk implant   REVERSE SHOULDER ARTHROPLASTY Left 06/24/2019   Procedure: REVERSE SHOULDER ARTHROPLASTY;  Surgeon: Edie Norleen PARAS, MD;  Location: [facility];  Service: Orthopedics;  Laterality: Left;   SHOULDER SURGERY Right    spur removal   SPINAL CORD STIMULATOR REMOVAL N/A 04/09/2021   Procedure: REMOVAL SPINAL CORD STIMULATOR & PULSE GENERATOR;  Surgeon: Bluford Standing, MD;  Location: [facility];  Service: Neurosurgery;  Laterality: N/A;  2nd case   THORACIC LAMINECTOMY FOR SPINAL CORD STIMULATOR N/A 08/07/2020   Procedure: THORACIC SPINAL CORD STIMULATOR VIA LAMINECTOMY, PULSE GENERATOR;  Surgeon: Bluford Standing, MD;  Location: [facility];  Service: Neurosurgery;  Laterality: N/A;   TOTAL HIP ARTHROPLASTY Right    TUBAL LIGATION      SOCIAL HISTORY: Social History   Socioeconomic History   Marital status: Single    Spouse name: Not on file   Number of children: Not on file   Years of education: Not on file   Highest education level: Not on file  Occupational History   Not on file  Tobacco Use   Smoking status: Former    Current packs/day: 0.00    Types: Cigarettes    Quit date: 81    Years since quitting: 31.5   Smokeless tobacco: Never  Vaping Use   Vaping status: Never Used  Substance and Sexual Activity    Alcohol  use: No    Alcohol /week: 0.0 standard drinks of alcohol    Drug use: Not Currently    Types: Crack cocaine, Marijuana    Comment: 1991   Sexual activity: Not Currently    Birth control/protection: None  Other Topics Concern   Not on file  Social History Narrative   Live in assisted living   Social Drivers of Health   Financial Resource Strain: Not on file  Food Insecurity: Food Insecurity Present (02/05/2023)   Hunger Vital Sign    Worried About Running Out of Food in the Last Year: Sometimes true    Ran Out of Food in the Last Year: Sometimes true  Transportation Needs: No Transportation Needs (02/05/2023)   PRAPARE - Administrator, Civil Service (Medical): No    Lack of Transportation (Non-Medical): No  Physical Activity: Not on file  Stress: Not on file  Social Connections: Not on file  Intimate Partner Violence: Not At Risk (02/05/2023)   Humiliation, Afraid, Rape, and Kick questionnaire    Fear of  Current or Ex-Partner: No    Emotionally Abused: No    Physically Abused: No    Sexually Abused: No    FAMILY HISTORY: Family History  Problem Relation Age of Onset   Cancer Mother    Heart disease Father    Breast cancer Neg Hx     ALLERGIES:  is allergic to cephalexin , ibuprofen, potassium chloride , shellfish allergy , and aspirin.  MEDICATIONS:  Current Outpatient Medications  Medication Sig Dispense Refill   acetaminophen  (TYLENOL ) 325 MG tablet Take 650 mg by mouth every 4 (four) hours as needed for mild pain, fever or headache.     albuterol  (VENTOLIN  HFA) 108 (90 Base) MCG/ACT inhaler Inhale 1 puff into the lungs every 4 (four) hours as needed for shortness of breath or wheezing.     amLODipine (NORVASC) 5 MG tablet Take 5 mg by mouth daily.     atorvastatin  (LIPITOR) 10 MG tablet Take 10 mg by mouth daily after supper.      budesonide-formoterol  (SYMBICORT) 160-4.5 MCG/ACT inhaler Inhale 2 puffs into the lungs 2 (two) times daily.     calcium   carbonate (OS-CAL) 600 MG TABS tablet Take 600 mg by mouth every other day.     celecoxib  (CELEBREX ) 200 MG capsule Take 200 mg by mouth 2 (two) times daily.     Cholecalciferol  (VITAMIN D3 SUPER STRENGTH) 50 MCG (2000 UT) TABS Take 3,000 Units by mouth daily.     ciclopirox  (LOPROX ) 0.77 % cream Apply to rash under breasts once to twice daily as needed. 90 g 2   ciclopirox  (LOPROX ) 0.77 % SUSP Apply to toenails nightly, remove once a week with alcohol . 60 mL 3   clobetasol  (TEMOVATE ) 0.05 % external solution Apply 1-2 drops to affected area scalp as needed for itch. Avoid face, groin, axilla. 50 mL 2   clonazePAM  (KLONOPIN ) 0.5 MG tablet Take 0.5 mg by mouth at bedtime as needed for anxiety.     diclofenac (VOLTAREN) 75 MG EC tablet Take 75 mg by mouth 2 (two) times daily.     diclofenac (VOLTAREN) 75 MG EC tablet Take 75 mg by mouth 2 (two) times daily.     DULoxetine  (CYMBALTA ) 30 MG capsule Take 30 mg by mouth at bedtime.     DULoxetine  (CYMBALTA ) 60 MG capsule Take 60 mg by mouth daily.     esomeprazole (NEXIUM) 40 MG capsule Take 40 mg by mouth 2 (two) times daily.      fluconazole  (DIFLUCAN ) 200 MG tablet Take 1 tablet (200 mg total) by mouth daily. 30 tablet 0   Fluocinolone  Acetonide Body 0.01 % OIL Apply to scalp twice daily as needed for itch. 118.28 mL 11   fluticasone  (FLONASE ) 50 MCG/ACT nasal spray Place 2 sprays into both nostrils daily.     gabapentin  (NEURONTIN ) 600 MG tablet Take 300 mg by mouth in the morning, at noon, and at bedtime.     HYDROcodone -acetaminophen  (NORCO/VICODIN) 5-325 MG tablet Take 1 tablet by mouth every 6 (six) hours as needed for moderate pain.     hydrocortisone  2.5 % cream Apply to aa's under the breast BID PRN. 30 g 5   ibandronate  (BONIVA ) 150 MG tablet Take 150 mg by mouth every 30 (thirty) days.     ketoconazole  (NIZORAL ) 2 % shampoo Massage into scalp and let sit 10-15 mins before rinsing. Use 1x/week. 120 mL 11   levocetirizine (XYZAL) 5 MG tablet  Take 5 mg by mouth every evening.     linaclotide  (LINZESS )  290 MCG CAPS capsule Take 290 mcg by mouth daily.     losartan  (COZAAR ) 100 MG tablet Take 100 mg by mouth daily.     methimazole  (TAPAZOLE ) 5 MG tablet Take 5 mg by mouth daily.     methocarbamol  1000 MG TABS Take 1,000 mg by mouth 3 (three) times daily.     methylPREDNISolone  (MEDROL  DOSEPAK) 4 MG TBPK tablet See admin instructions. follow package directions     methylPREDNISolone  (MEDROL  DOSEPAK) 4 MG TBPK tablet Take by mouth.     MOUNJARO 5 MG/0.5ML Pen Inject 5 mg into the skin once a week.     Multiple Vitamin (MULTIVITAMIN WITH MINERALS) TABS tablet Take 1 tablet by mouth daily.     Naloxone  HCl (NARCAN  NA) Place 4 mg into the nose 3 (three) times daily as needed.     pimecrolimus  (ELIDEL ) 1 % cream Apply to affected areas rash under breasts once to twice daily as needed. 60 g 3   potassium chloride  (KLOR-CON ) 10 MEQ tablet Take 10 mEq by mouth daily.     propranolol  (INDERAL ) 80 MG tablet Take 80 mg by mouth 2 (two) times daily.     sucralfate  (CARAFATE ) 1 g tablet Take 1 g by mouth 4 (four) times daily -  before meals and at bedtime.     torsemide  (DEMADEX ) 10 MG tablet Take 10 mg by mouth daily.     traZODone  (DESYREL ) 150 MG tablet Take 75 mg by mouth at bedtime as needed for sleep.      pregabalin (LYRICA) 50 MG capsule Take 50 mg by mouth 2 (two) times daily. (Patient not taking: Reported on 03/10/2024)     terbinafine  (LAMISIL ) 250 MG tablet Take 1 tablet (250 mg total) by mouth daily. Take with food. (Patient not taking: Reported on 03/10/2024) 30 tablet 2   No current facility-administered medications for this visit.      PHYSICAL EXAMINATION:  Vitals:   03/10/24 1440  BP: 135/72  Pulse: 79  Resp: 16  Temp: 97.9 F (36.6 C)  SpO2: 100%     Filed Weights   03/10/24 1440  Weight: 239 lb (108.4 kg)      Physical Exam Vitals and nursing note reviewed.  HENT:     Head: Normocephalic and atraumatic.      Mouth/Throat:     Pharynx: Oropharynx is clear.  Eyes:     Extraocular Movements: Extraocular movements intact.     Pupils: Pupils are equal, round, and reactive to light.  Cardiovascular:     Rate and Rhythm: Normal rate and regular rhythm.  Pulmonary:     Comments: Decreased breath sounds bilaterally.  Abdominal:     Palpations: Abdomen is soft.  Musculoskeletal:        General: Normal range of motion.     Cervical back: Normal range of motion.  Skin:    General: Skin is warm.  Neurological:     General: No focal deficit present.     Mental Status: She is alert and oriented to person, place, and time.  Psychiatric:        Behavior: Behavior normal.        Judgment: Judgment normal.      LABORATORY DATA:  I have reviewed the data as listed Lab Results  Component Value Date   WBC 4.0 03/10/2024   HGB 12.9 03/10/2024   HCT 38.5 03/10/2024   MCV 84.8 03/10/2024   PLT 217 03/10/2024   Recent Labs  09/09/23 1502 03/10/24 1407  NA 131* 132*  K 4.4 4.6  CL 99 102  CO2 26 24  GLUCOSE 95 106*  BUN 8 12  CREATININE 0.67 0.59  CALCIUM  9.7 9.2  GFRNONAA >60 >60  PROT 7.0 7.5  ALBUMIN 4.5 4.7  AST 21 22  ALT 23 25  ALKPHOS 88 101  BILITOT 0.8 0.8    RADIOGRAPHIC STUDIES: I have personally reviewed the radiological images as listed and agreed with the findings in the report. No results found.  ASSESSMENT & PLAN:   Other neutropenia (HCC) #Leukopenia- neutropenia/mild intermittent anemia [MAY 2024- [facility]- 0.94] normal /platelets.  June 2024 white count 2.4 ANC of 2; lymphopenia 0.5. negative for LDH;B12; folic acid ; HIV, Hep C; Hep B.  June 2024- US  Abdomen- Increased hepatic parenchymal echogenicity suggestive of steatosis. No splenomegaly.   # Patient is asymptomatic-benign ethnic neutropenia/lympopenia [most likely benign] Vs. autoimmune.  Today WBC- 4.0- ANC- WNL- As patient is asymptomatic I think is reasonable to hold off further workup like bone  marrow biopsy for now.   # June 2024- US  Abdomen- Increased hepatic parenchymal echogenicity suggestive of steatosis/fattty liver; discussed re: risk of cirrhosis.  on Monjauro/ defer to PCP. Discussed re: weight loss.   # Autoimmune: Psoriatic arthrtitis/ fibromyalgia/Hyperthyroidism [on Cimzia  q 22m]- ? Cause of leucopenia; monitor for now.   #Since patient is clinically stable I think is reasonable for the patient to follow-up with PCP/can follow-up with us  as needed.  Patient comfortable with the plan; to call us  if any questions or concerns in the interim.  # DISPOSITION: # follow up as needed-  -Dr.B    All questions were answered. The patient knows to call the clinic with any problems, questions or concerns.    Cindy JONELLE Joe, MD 03/10/2024 3:03 PM

## 2024-03-10 NOTE — Assessment & Plan Note (Addendum)
#  Leukopenia- neutropenia/mild intermittent anemia [MAY 2024- Rhe[facility]- 0.94] normal /platelets.  June 2024 white count 2.4 ANC of 2; lymphopenia 0.5. negative for LDH;B12; folic acid ; HIV, Hep C; Hep B.  June 2024- US  Abdomen- Increased hepatic parenchymal echogenicity suggestive of steatosis. No splenomegaly.   # Patient is asymptomatic-benign ethnic neutropenia/lympopenia [most likely benign] Vs. autoimmune.  Today WBC- 4.0- ANC- WNL- As patient is asymptomatic I think is reasonable to hold off further workup like bone marrow biopsy for now.   # June 2024- US  Abdomen- Increased hepatic parenchymal echogenicity suggestive of steatosis/fattty liver; discussed re: risk of cirrhosis.  on Monjauro/ defer to PCP. Discussed re: weight loss.   # Autoimmune: Psoriatic arthrtitis/ fibromyalgia/Hyperthyroidism [on Cimzia  q 59m]- ? Cause of leucopenia; monitor for now.   #Since patient is clinically stable I think is reasonable for the patient to follow-up with PCP/can follow-up with us  as needed.  Patient comfortable with the plan; to call us  if any questions or concerns in the interim.  # DISPOSITION: # follow up as needed-  -Dr.B

## 2024-03-10 NOTE — Progress Notes (Signed)
 Pt has had multiple falls. Hurting from the falls; taking medrol  dose pack for the achiness from her falls. Has an appt with neurology this month. Has an appetite. On mounjaro.

## 2024-03-13 ENCOUNTER — Other Ambulatory Visit: Payer: Self-pay | Admitting: Dermatology

## 2024-03-13 DIAGNOSIS — B351 Tinea unguium: Secondary | ICD-10-CM

## 2024-03-17 ENCOUNTER — Encounter: Payer: Self-pay | Admitting: Student in an Organized Health Care Education/Training Program

## 2024-03-17 ENCOUNTER — Ambulatory Visit
Admission: RE | Admit: 2024-03-17 | Discharge: 2024-03-17 | Disposition: A | Discharge status: Home / Self Care | Source: Ambulatory Visit | Attending: Student in an Organized Health Care Education/Training Program | Admitting: Student in an Organized Health Care Education/Training Program

## 2024-03-17 ENCOUNTER — Ambulatory Visit: Admitting: Student in an Organized Health Care Education/Training Program

## 2024-03-17 VITALS — BP 116/81 | HR 74 | Temp 97.4°F | Resp 18 | Ht 71.0 in | Wt 234.0 lb

## 2024-03-17 DIAGNOSIS — G5700 Lesion of sciatic nerve, unspecified lower limb: Secondary | ICD-10-CM | POA: Insufficient documentation

## 2024-03-17 DIAGNOSIS — Z96653 Presence of artificial knee joint, bilateral: Secondary | ICD-10-CM | POA: Diagnosis not present

## 2024-03-17 DIAGNOSIS — M549 Dorsalgia, unspecified: Secondary | ICD-10-CM | POA: Diagnosis not present

## 2024-03-17 DIAGNOSIS — E119 Type 2 diabetes mellitus without complications: Secondary | ICD-10-CM | POA: Diagnosis not present

## 2024-03-17 DIAGNOSIS — Z1331 Encounter for screening for depression: Secondary | ICD-10-CM | POA: Diagnosis not present

## 2024-03-17 DIAGNOSIS — F325 Major depressive disorder, single episode, in full remission: Secondary | ICD-10-CM | POA: Diagnosis not present

## 2024-03-17 DIAGNOSIS — M461 Sacroiliitis, not elsewhere classified: Secondary | ICD-10-CM | POA: Diagnosis not present

## 2024-03-17 DIAGNOSIS — E7800 Pure hypercholesterolemia, unspecified: Secondary | ICD-10-CM | POA: Diagnosis not present

## 2024-03-17 DIAGNOSIS — E041 Nontoxic single thyroid nodule: Secondary | ICD-10-CM | POA: Diagnosis not present

## 2024-03-17 DIAGNOSIS — G894 Chronic pain syndrome: Secondary | ICD-10-CM | POA: Insufficient documentation

## 2024-03-17 DIAGNOSIS — L4050 Arthropathic psoriasis, unspecified: Secondary | ICD-10-CM | POA: Diagnosis not present

## 2024-03-17 DIAGNOSIS — M533 Sacrococcygeal disorders, not elsewhere classified: Secondary | ICD-10-CM

## 2024-03-17 DIAGNOSIS — G5703 Lesion of sciatic nerve, bilateral lower limbs: Secondary | ICD-10-CM

## 2024-03-17 DIAGNOSIS — I10 Essential (primary) hypertension: Secondary | ICD-10-CM | POA: Diagnosis not present

## 2024-03-17 DIAGNOSIS — Z0000 Encounter for general adult medical examination without abnormal findings: Secondary | ICD-10-CM | POA: Diagnosis not present

## 2024-03-17 MED ORDER — METHYLPREDNISOLONE ACETATE 80 MG/ML IJ SUSP
80.0000 mg | Freq: Once | INTRAMUSCULAR | Status: AC
  Administered 2024-03-17: 80 mg via INTRA_ARTICULAR

## 2024-03-17 MED ORDER — ROPIVACAINE HCL 2 MG/ML IJ SOLN
18.0000 mL | Freq: Once | INTRAMUSCULAR | Status: AC
  Administered 2024-03-17: 18 mL via PERINEURAL

## 2024-03-17 MED ORDER — LIDOCAINE HCL 2 % IJ SOLN
20.0000 mL | Freq: Once | INTRAMUSCULAR | Status: AC
  Administered 2024-03-17: 400 mg

## 2024-03-17 MED ORDER — METHYLPREDNISOLONE ACETATE 80 MG/ML IJ SUSP
INTRAMUSCULAR | Status: AC
  Filled 2024-03-17: qty 1

## 2024-03-17 MED ORDER — DEXAMETHASONE SODIUM PHOSPHATE 10 MG/ML IJ SOLN
10.0000 mg | Freq: Once | INTRAMUSCULAR | Status: AC
  Administered 2024-03-17: 10 mg

## 2024-03-17 MED ORDER — DEXAMETHASONE SODIUM PHOSPHATE 10 MG/ML IJ SOLN
INTRAMUSCULAR | Status: AC
  Filled 2024-03-17: qty 2

## 2024-03-17 MED ORDER — IOHEXOL 180 MG/ML  SOLN
10.0000 mL | Freq: Once | INTRAMUSCULAR | Status: AC
  Administered 2024-03-17: 10 mL via INTRA_ARTICULAR

## 2024-03-17 MED ORDER — IOHEXOL 180 MG/ML  SOLN
INTRAMUSCULAR | Status: AC
  Filled 2024-03-17: qty 20

## 2024-03-17 MED ORDER — LIDOCAINE HCL 2 % IJ SOLN
INTRAMUSCULAR | Status: AC
  Filled 2024-03-17: qty 20

## 2024-03-17 MED ORDER — ROPIVACAINE HCL 2 MG/ML IJ SOLN
INTRAMUSCULAR | Status: AC
  Filled 2024-03-17: qty 20

## 2024-03-17 NOTE — Patient Instructions (Signed)

## 2024-03-17 NOTE — Progress Notes (Signed)
 PROVIDER NOTE: Information contained herein reflects review and annotations entered in association with encounter. Interpretation of such information and data should be left to medically-trained personnel. Information provided to patient can be located elsewhere in the medical record under Patient Instructions. Document created using STT-dictation technology, any transcriptional errors that may result from process are unintentional.    Patient: Christine Higgins  Service Category: Procedure  Provider: Wallie Sherry, MD  DOB: 04-19-1956  DOS: 03/17/2024  Location: [facility]  MRN: [id number]  Setting: Ambulatory - outpatient  Referring Provider: Lenon Layman ORN, MD  Type: Established Patient  Specialty: Interventional Pain Management  PCP: Lenon Layman ORN, MD   Primary Reason for Visit: Interventional Pain Management Treatment. CC: SI joint and buttock pain    Procedure:          Anesthesia, Analgesia, Anxiolysis:  Type: Therapeutic BILATERAL Sacroiliac Joint Steroid Injection and Bilateral piriformis trigger point injection  Region: Inferior Lumbosacral Region Level: PIIS (Posterior Inferior Iliac Spine) Laterality: Bilateral  Type: Local Anesthesia  Local Anesthetic: Lidocaine  1-2%    Position: Prone           Indications: 1. SI joint arthritis (HCC)   2. Sacroiliac joint pain   3. Piriformis syndrome of both sides   4. Chronic pain syndrome    Pain Score: Pre-procedure: 8 /10 Post-procedure: 8 /10     Pre-op H&P Assessment:  Christine Higgins is a 68 y.o. (year old), female patient, seen today for interventional treatment. She  has a past surgical history that includes carpel tunn (Right); Hand surgery (Right); carpel tunnel (Left); Cesarean section; Shoulder surgery (Right); Back surgery; Neck surgery; Total hip arthroplasty (Right); Excision Morton's neuroma (Left, 02/05/2017); Colonoscopy; Joint replacement; Esophagogastroduodenoscopy (egd) with propofol  (N/A,  06/26/2017); Knee Arthroplasty (Left, 08/11/2017); Knee arthroscopy (Right, 07/02/2018); Reverse shoulder arthroplasty (Left, 06/24/2019); Thoracic laminectomy for spinal cord stimulator (N/A, 08/07/2020); Colonoscopy with propofol  (N/A, 10/20/2020); Cardiac catheterization (2006); Tubal ligation; Knee Arthroplasty (Right, 02/05/2021); Spinal cord stimulator removal (N/A, 04/09/2021); Cataract extraction (Bilateral, 2022); Fracture surgery (1969); Lumbar laminectomy/decompression microdiscectomy (N/A, 06/19/2022); and Dilatation & curettage/hysteroscopy with myosure (N/A, 10/24/2022). Christine Higgins has a current medication list which includes the following prescription(s): acetaminophen , albuterol , amlodipine, atorvastatin , budesonide-formoterol , calcium  carbonate, celecoxib , vitamin d3 super strength, ciclopirox , ciclopirox , clobetasol , clonazepam , clotrimazole -betamethasone , diclofenac, duloxetine , duloxetine , esomeprazole, fluconazole , fluocinolone  acetonide body, fluticasone , gabapentin , hydrocodone -acetaminophen , hydrocortisone , ibandronate , ketoconazole , levocetirizine, linaclotide , losartan , methimazole , methocarbamol , mounjaro, multivitamin with minerals, naloxone  hcl, pimecrolimus , potassium chloride , propranolol , sucralfate , torsemide , trazodone , diclofenac, methylprednisolone , mounjaro, pregabalin, and terbinafine . Her primarily concern today is the Back Pain (Bilateral buttocks pain worse on the right )   Initial Vital Signs:  Pulse/HCG Rate: 74ECG Heart Rate: 75 Temp: (!) 97.4 F (36.3 C) Resp: 16 BP: 121/74 SpO2: 100 %  BMI: Estimated body mass index is 32.64 kg/m as calculated from the following:   Height as of this encounter: 5' 11 (1.803 m).   Weight as of this encounter: 234 lb (106.1 kg).  Risk Assessment: Allergies: Reviewed. She is allergic to cephalexin , ibuprofen, potassium chloride , shellfish allergy , and aspirin.  Allergy  Precautions: None required Coagulopathies: Reviewed.  None identified.  Blood-thinner therapy: None at this time Active Infection(s): Reviewed. None identified. Christine Higgins is afebrile  Site Confirmation: Christine Higgins was asked to confirm the procedure and laterality before marking the site Procedure checklist: Completed Consent: Before the procedure and under the influence of no sedative(s), amnesic(s), or anxiolytics, the patient was informed of the treatment options, risks and possible complications. To fulfill our  ethical and legal obligations, as recommended by the American Medical Association's Code of Ethics, I have informed the patient of my clinical impression; the nature and purpose of the treatment or procedure; the risks, benefits, and possible complications of the intervention; the alternatives, including doing nothing; the risk(s) and benefit(s) of the alternative treatment(s) or procedure(s); and the risk(s) and benefit(s) of doing nothing. The patient was provided information about the general risks and possible complications associated with the procedure. These may include, but are not limited to: failure to achieve desired goals, infection, bleeding, organ or nerve damage, allergic reactions, paralysis, and death. In addition, the patient was informed of those risks and complications associated to the procedure, such as failure to decrease pain; infection; bleeding; organ or nerve damage with subsequent damage to sensory, motor, and/or autonomic systems, resulting in permanent pain, numbness, and/or weakness of one or several areas of the body; allergic reactions; (i.e.: anaphylactic reaction); and/or death. Furthermore, the patient was informed of those risks and complications associated with the medications. These include, but are not limited to: allergic reactions (i.e.: anaphylactic or anaphylactoid reaction(s)); adrenal axis suppression; blood sugar elevation that in diabetics may result in ketoacidosis or comma; water retention that in  patients with history of congestive heart failure may result in shortness of breath, pulmonary edema, and decompensation with resultant heart failure; weight gain; swelling or edema; medication-induced neural toxicity; particulate matter embolism and blood vessel occlusion with resultant organ, and/or nervous system infarction; and/or aseptic necrosis of one or more joints. Finally, the patient was informed that Medicine is not an exact science; therefore, there is also the possibility of unforeseen or unpredictable risks and/or possible complications that may result in a catastrophic outcome. The patient indicated having understood very clearly. We have given the patient no guarantees and we have made no promises. Enough time was given to the patient to ask questions, all of which were answered to the patient's satisfaction. Ms. Camberos has indicated that she wanted to continue with the procedure. Attestation: I, the ordering provider, attest that I have discussed with the patient the benefits, risks, side-effects, alternatives, likelihood of achieving goals, and potential problems during recovery for the procedure that I have provided informed consent. Date  Time: 03/17/2024 11:02 AM  Pre-Procedure Preparation:  Monitoring: As per clinic protocol. Respiration, ETCO2, SpO2, BP, heart rate and rhythm monitor placed and checked for adequate function Safety Precautions: Patient was assessed for positional comfort and pressure points before starting the procedure. Time-out: I initiated and conducted the Time-out before starting the procedure, as per protocol. The patient was asked to participate by confirming the accuracy of the Time Out information. Verification of the correct person, site, and procedure were performed and confirmed by me, the nursing staff, and the patient. Time-out conducted as per Joint Commission's Universal Protocol (UP.01.01.01). Time: 1151  Description of Procedure:           Target Area: Inferior, posterior, aspect of the sacroiliac fissure Approach: Posterior, paraspinal, ipsilateral approach. Area Prepped: Entire Lower Lumbosacral Region DuraPrep (Iodine  Povacrylex [0.7% available iodine ] and Isopropyl Alcohol , 74% w/w) Safety Precautions: Aspiration looking for blood return was conducted prior to all injections. At no point did we inject any substances, as a needle was being advanced. No attempts were made at seeking any paresthesias. Safe injection practices and needle disposal techniques used. Medications properly checked for expiration dates. SDV (single dose vial) medications used. Description of the Procedure: Protocol guidelines were followed. The patient was placed in position  over the procedure table. The target area was identified and the area prepped in the usual manner. Skin & deeper tissues infiltrated with local anesthetic. Appropriate amount of time allowed to pass for local anesthetics to take effect. The procedure needle was advanced under fluoroscopic guidance into the sacroiliac joint until a firm endpoint was obtained. Proper needle placement secured. Negative aspiration confirmed. Solution injected in intermittent fashion, asking for systemic symptoms every 0.5cc of injectate. The needles were then removed and the area cleansed, making sure to leave some of the prepping solution back to take advantage of its long term bactericidal properties. Vitals:   03/17/24 1111 03/17/24 1151 03/17/24 1158 03/17/24 1159  BP: 121/74 115/81 (!) 131/95 116/81  Pulse: 74     Resp: 16 18 16 18   Temp: (!) 97.4 F (36.3 C)     TempSrc: Temporal     SpO2: 100% 100% 97% 100%  Weight: 234 lb (106.1 kg)     Height: 5' 11 (1.803 m)        Start Time: 1151 hrs. End Time: 1159 hrs. Materials:  Needle(s) Type: Spinal Needle Gauge: 22G Length: 3.5-in Medication(s): Please see orders for medications and dosing details. 5cc solution made of 4.5 cc of 0.2%  ropivacaine , 0.5 cc of methylprednisolone , 80 mg/cc.  5 cc injected into right SI-J after contrast confirmation. 5cc solution made of 4.5 cc of 0.2% ropivacaine , 0.5 cc of methylprednisolone , 80 mg/cc.  5 cc injected into left SI-J after contrast confirmation.  Afterwards RIGHT & LEFT  piriformis trigger point injections were also performed.  1 cm inferior, 1 cm deep, 1 cm lateral to the inferior sacroiliac fissure, the piriformis muscle was highlighted under contrast with muscle striations visible. 4 cc solution consisting of 3.5 cc of 0.2% ropivacaine , 0.5 cc of Decadron  10 mg/cc.  4 cc injected into  right piriformis muscle after contrast confirmation of muscle striations.  No radiating leg pain during injection. 4 cc solution consisting of 3.5 cc of 0.2% ropivacaine , 0.5 cc of Decadron  10 mg/cc.  4 cc injected into  left piriformis muscle after contrast confirmation of muscle striations.  No radiating leg pain during injection.  Imaging Guidance (Non-Spinal):          Type of Imaging Technique: Fluoroscopy Guidance (Non-Spinal) Indication(s): Assistance in needle guidance and placement for procedures requiring needle placement in or near specific anatomical locations not easily accessible without such assistance. Exposure Time: Please see nurses notes. Contrast: Before injecting any contrast, we confirmed that the patient did not have an allergy  to iodine , shellfish, or radiological contrast. Once satisfactory needle placement was completed at the desired level, radiological contrast was injected. Contrast injected under live fluoroscopy. No contrast complications. See chart for type and volume of contrast used. Fluoroscopic Guidance: I was personally present during the use of fluoroscopy. Tunnel Vision Technique used to obtain the best possible view of the target area. Parallax error corrected before commencing the procedure. Direction-depth-direction technique used to introduce the needle under  continuous pulsed fluoroscopy. Once target was reached, antero-posterior, oblique, and lateral fluoroscopic projection used confirm needle placement in all planes. Images permanently stored in EMR. Interpretation: I personally interpreted the imaging intraoperatively. Adequate needle placement confirmed in multiple planes. Appropriate spread of contrast into desired area was observed. No evidence of afferent or efferent intravascular uptake. Permanent images saved into the patient's record.   Post-operative Assessment:  Post-procedure Vital Signs:  Pulse/HCG Rate: 7477 Temp: (!) 97.4 F (36.3 C) Resp: 18 BP: 116/81 SpO2:  100 %  EBL: None  Complications: No immediate post-treatment complications observed by team, or reported by patient.  Note: The patient tolerated the entire procedure well. A repeat set of vitals were taken after the procedure and the patient was kept under observation following institutional policy, for this type of procedure. Post-procedural neurological assessment was performed, showing return to baseline, prior to discharge. The patient was provided with post-procedure discharge instructions, including a section on how to identify potential problems. Should any problems arise concerning this procedure, the patient was given instructions to immediately contact us , at any time, without hesitation. In any case, we plan to contact the patient by telephone for a follow-up status report regarding this interventional procedure.  Comments:  No additional relevant information.  Plan of Care  Orders:  Orders Placed This Encounter  Procedures   DG PAIN CLIN[facility] C-ARM 1-60 MIN NO REPORT    Intraoperative interpretation by procedural physician at [facility].    Standing Status:   Standing    Number of Occurrences:   1    Reason for exam::   Assistance in needle guidance and placement for procedures requiring needle placement in or near specific anatomical locations not  easily accessible without such assistance.    Chronic Opioid Analgesic:  Tramadol  100 mg twice daily, quantity 120/month MME equals 20   Medications ordered for procedure: Meds ordered this encounter  Medications   iohexol  (OMNIPAQUE ) 180 MG/ML injection 10 mL    Must be Myelogram-compatible. If not available, you may substitute with a water-soluble, non-ionic, hypoallergenic, myelogram-compatible radiological contrast medium.   lidocaine  (XYLOCAINE ) 2 % (with pres) injection 400 mg   ropivacaine  (PF) 2 mg/mL (0.2%) (NAROPIN ) injection 18 mL   dexamethasone  (DECADRON ) injection 10 mg   methylPREDNISolone  acetate (DEPO-MEDROL ) injection 80 mg    Medications administered: We administered iohexol , lidocaine , ropivacaine  (PF) 2 mg/mL (0.2%), dexamethasone , and methylPREDNISolone  acetate.  See the medical record for exact dosing, route, and time of administration.  Follow-up plan:   Return in about 10 weeks (around 05/26/2024) for PPE, VV.     Recent Visits Date Type Provider Dept  02/09/24 Office Visit Marcelino Nurse, MD [facility]  Showing recent visits within past 90 days and meeting all other requirements Today's Visits Date Type Provider Dept  03/17/24 Procedure visit Marcelino Nurse, MD [facility]  Showing today's visits and meeting all other requirements Future Appointments Date Type Provider Dept  05/25/24 Appointment Marcelino Nurse, MD [facility]  Showing future appointments within next 90 days and meeting all other requirements  Disposition: Discharge home  Discharge (Date  Time): 03/17/2024; 1210 hrs.   Primary Care Physician: Lenon Layman ORN, MD Location: [facility] Note by: Nurse Marcelino, MD Date: 03/17/2024; Time: 12:58 PM  Disclaimer:  Medicine is not an exact science. The only guarantee in medicine is that nothing is guaranteed. It is important to note that the decision to proceed with this  intervention was based on the information collected from the patient. The Data and conclusions were drawn from the patient's questionnaire, the interview, and the physical examination. Because the information was provided in large part by the patient, it cannot be guaranteed that it has not been purposely or unconsciously manipulated. Every effort has been made to obtain as much relevant data as possible for this evaluation. It is important to note that the conclusions that lead to this procedure are derived in large part from the available data. Always take  into account that the treatment will also be dependent on availability of resources and existing treatment guidelines, considered by other Pain Management Practitioners as being common knowledge and practice, at the time of the intervention. For Medico-Legal purposes, it is also important to point out that variation in procedural techniques and pharmacological choices are the acceptable norm. The indications, contraindications, technique, and results of the above procedure should only be interpreted and judged by a Board-Certified Interventional Pain Specialist with extensive familiarity and expertise in the same exact procedure and technique.

## 2024-03-17 NOTE — Progress Notes (Signed)
 Safety precautions to be maintained throughout the outpatient stay will include: orient to surroundings, keep bed in low position, maintain call bell within reach at all times, provide assistance with transfer out of bed and ambulation.

## 2024-03-18 ENCOUNTER — Telehealth: Payer: Self-pay | Admitting: *Deleted

## 2024-03-18 NOTE — Telephone Encounter (Signed)
 No problems post procedure.

## 2024-03-23 DIAGNOSIS — E042 Nontoxic multinodular goiter: Secondary | ICD-10-CM | POA: Diagnosis not present

## 2024-03-23 DIAGNOSIS — I10 Essential (primary) hypertension: Secondary | ICD-10-CM | POA: Diagnosis not present

## 2024-03-23 DIAGNOSIS — E0500 Thyrotoxicosis with diffuse goiter without thyrotoxic crisis or storm: Secondary | ICD-10-CM | POA: Diagnosis not present

## 2024-04-08 DIAGNOSIS — E1142 Type 2 diabetes mellitus with diabetic polyneuropathy: Secondary | ICD-10-CM | POA: Diagnosis not present

## 2024-04-08 DIAGNOSIS — M2170 Unequal limb length (acquired), unspecified site: Secondary | ICD-10-CM | POA: Diagnosis not present

## 2024-04-14 DIAGNOSIS — E041 Nontoxic single thyroid nodule: Secondary | ICD-10-CM | POA: Diagnosis not present

## 2024-05-04 DIAGNOSIS — M4306 Spondylolysis, lumbar region: Secondary | ICD-10-CM | POA: Diagnosis not present

## 2024-05-04 DIAGNOSIS — M47816 Spondylosis without myelopathy or radiculopathy, lumbar region: Secondary | ICD-10-CM | POA: Diagnosis not present

## 2024-05-04 DIAGNOSIS — M5416 Radiculopathy, lumbar region: Secondary | ICD-10-CM | POA: Diagnosis not present

## 2024-05-21 ENCOUNTER — Encounter: Payer: Self-pay | Admitting: Nurse Practitioner

## 2024-05-21 NOTE — Progress Notes (Signed)
 Previsit Call

## 2024-05-24 ENCOUNTER — Telehealth: Payer: Self-pay | Admitting: Student in an Organized Health Care Education/Training Program

## 2024-05-24 ENCOUNTER — Ambulatory Visit: Attending: Nurse Practitioner | Admitting: Nurse Practitioner

## 2024-05-24 ENCOUNTER — Encounter: Admitting: Dermatology

## 2024-05-24 DIAGNOSIS — M533 Sacrococcygeal disorders, not elsewhere classified: Secondary | ICD-10-CM | POA: Diagnosis not present

## 2024-05-24 DIAGNOSIS — G894 Chronic pain syndrome: Secondary | ICD-10-CM | POA: Diagnosis not present

## 2024-05-24 DIAGNOSIS — G5703 Lesion of sciatic nerve, bilateral lower limbs: Secondary | ICD-10-CM | POA: Diagnosis not present

## 2024-05-24 DIAGNOSIS — M461 Sacroiliitis, not elsewhere classified: Secondary | ICD-10-CM | POA: Diagnosis not present

## 2024-05-24 NOTE — Progress Notes (Signed)
 1  Patient: Christine Higgins  Service: E/M   PCP: Lenon Layman ORN, MD  DOB: 10/29/1955  DOS: 05/24/2024  Provider: Emmy MARLA Blanch, NP  MRN: [id number]  Delivery: Virtual Visit  Specialty: Interventional Pain Management  Type: Established Patient  Setting: Ambulatory outpatient facility  Specialty designation: 09  Referring Prov.: Lenon Layman ORN, MD  Location: Remote location       Virtual Encounter - Pain Management PROVIDER NOTE: Information contained herein reflects review and annotations entered in association with encounter. Interpretation of such information and data should be left to medically-trained personnel. Information provided to patient can be located elsewhere in the medical record under Patient Instructions. Document created using STT-dictation technology, any transcriptional errors that may result from process are unintentional.    Contact & Pharmacy Preferred: [phone] Home: [phone] (home) Mobile: [phone] (mobile) E-mail: [email or web address]  [facility] [place] - [address] [place] [address] Phone: [phone] Fax: [phone]  [id number] [place] [address] [place] [address] Phone: [phone] Fax: [phone]   Pre-screening  [name] offered in-person vs virtual encounter. She indicated preferring virtual for this encounter.   Reason COVID-19*  Social distancing based on CDC and AMA recommendations.   I contacted Arly A Scurlock on 05/24/2024 via telephone.      I clearly identified myself as Emmy MARLA Blanch, NP. I verified that I was speaking with the correct person using two identifiers (Name: Christine Higgins, and date of birth: 1956/05/12).  Consent I sought verbal advanced consent from Coda A Agar for virtual visit interactions. I informed Ms. Upshaw of possible security and privacy concerns, risks, and limitations associated  with providing not-in-person medical evaluation and management services. I also informed Ms. Mancusi of the availability of in-person appointments. Finally, I informed her that there would be a charge for the virtual visit and that she could be  personally, fully or partially, financially responsible for it. Ms. Simek expressed understanding and agreed to proceed.   Historic Elements   [name] is a 68 y.o. year old, female patient evaluated today after our last contact on Visit date not found. [name]  has a past medical history of Anxiety, Asthma, Breast mass, Cervical disc disease, Chronic pain syndrome, COPD (chronic obstructive pulmonary disease) (HCC), Degenerative disc disease, lumbar, Depression, Diabetes mellitus without complication (HCC), Fibromyalgia, Fibromyalgia, GERD (gastroesophageal reflux disease), Glaucoma, Graves disease, Headache, Hemorrhoids, History of hiatal hernia, Hyperlipidemia, Hypertension, Hyperthyroidism, Lumbar disc disease, Obesity, Pre-diabetes, Psoriatic arthritis (HCC), and Thyroid  disease. She also  has a past surgical history that includes carpel tunn (Right); Hand surgery (Right); carpel tunnel (Left); Cesarean section; Shoulder surgery (Right); Back surgery; Neck surgery; Total hip arthroplasty (Right); Excision Morton's neuroma (Left, 02/05/2017); Colonoscopy; Joint replacement; Esophagogastroduodenoscopy (egd) with propofol  (N/A, 06/26/2017); Knee Arthroplasty (Left, 08/11/2017); Knee arthroscopy (Right, 07/02/2018); Reverse shoulder arthroplasty (Left, 06/24/2019); Thoracic laminectomy for spinal cord stimulator (N/A, 08/07/2020); Colonoscopy with propofol  (N/A, 10/20/2020); Cardiac catheterization (2006); Tubal ligation; Knee Arthroplasty (Right, 02/05/2021); Spinal cord stimulator removal (N/A, 04/09/2021); Cataract extraction (Bilateral, 2022); Fracture surgery (1969); Lumbar laminectomy/decompression microdiscectomy (N/A, 06/19/2022); and Dilatation &  curettage/hysteroscopy with myosure (N/A, 10/24/2022). [name] has a current medication list which includes the following prescription(s): acetaminophen , albuterol , amlodipine, atorvastatin , budesonide-formoterol , calcium  carbonate, celecoxib , vitamin d3 super strength, ciclopirox , ciclopirox , clobetasol , clonazepam , clotrimazole -betamethasone , diclofenac, diclofenac, duloxetine , duloxetine , esomeprazole, fluconazole , fluocinolone  acetonide body, fluticasone , gabapentin , hydrocodone -acetaminophen , hydrocortisone , ibandronate , ketoconazole ,  levocetirizine, linaclotide , losartan , methimazole , methocarbamol , methylprednisolone , mounjaro, mounjaro, multivitamin with minerals, naloxone  hcl, pimecrolimus , potassium chloride , pregabalin, propranolol , sucralfate , terbinafine , torsemide , and trazodone . She  reports that she quit smoking about 31 years ago. Her smoking use included cigarettes. She has never used smokeless tobacco. She reports that she does not currently use drugs after having used the following drugs: Crack cocaine and Marijuana. She reports that she does not drink alcohol . [name] is allergic to cephalexin , ibuprofen, potassium chloride , shellfish allergy , and aspirin.  BMI: Estimated body mass index is 32.64 kg/m as calculated from the following:   Height as of 03/17/24: 5' 11 (1.803 m).   Weight as of 03/17/24: 234 lb (106.1 kg). Last encounter: Visit date not found. Last procedure: Visit date not found.  HPI  Today, she is being contacted for a post-procedure assessment.   [name] received a Therapeutic BILATERAL Sacroiliac Joint Steroid Injection and Bilateral piriformis trigger point injection on March 17 2024.  She reports initially 100% pain relief during local anesthetic phase, followed sustained 50% pain relief and functional improvement approximately 15 days; however after that period pain is back to baseline with pain level 10/10)   Procedure Procedure:           Anesthesia,  Analgesia, Anxiolysis:  Type: Therapeutic BILATERAL Sacroiliac Joint Steroid Injection and Bilateral piriformis trigger point injection  Region: Inferior Lumbosacral Region Level: PIIS (Posterior Inferior Iliac Spine) Laterality: Bilateral   Type: Local Anesthesia  Local Anesthetic: Lidocaine  1-2%       Position: Prone            Indications: 1. SI joint arthritis (HCC)   2. Sacroiliac joint pain   3. Piriformis syndrome of both sides   4. Chronic pain syndrome     Pain Score: Pre-procedure: 8 /10 Post-procedure: 8 /10   Post-Procedure Evaluation    Effectiveness:  Initial hour after procedure:   100%. Subsequent 4-6 hours post-procedure:   100%. Analgesia past initial 6 hours:   50% for approximately 15 days; however after that pain is back to baseline (Today's pain level 10/10) Ongoing improvement:  Analgesic:  Ms. Wiechmann received a Therapeutic BILATERAL Sacroiliac Joint Steroid Injection and Bilateral piriformis trigger point injection on March 17 2024.  She reports initially 100% pain relief during local anesthetic phase, followed sustained 50% pain relief and functional improvement approximately 15 days; however after that period pain is back to baseline with pain level 10/10) Pharmacotherapy Assessment  Monitoring: [place] PMP: PDMP not reviewed this encounter.       Pharmacotherapy: No side-effects or adverse reactions reported. Compliance: No problems identified. Effectiveness: Clinically acceptable. Plan: Refer to POC.  UDS:  Summary  Date Value Ref Range Status  11/15/2021 Note  Final    Comment:    ==================================================================== [employer] Select 13 (MW) ==================================================================== Test                             Result       Flag       Units  Drug Present and Declared for Prescription Verification   7-aminoclonazepam              42           EXPECTED   ng/mg creat     7-aminoclonazepam is an expected metabolite of clonazepam . Source of    clonazepam  is a scheduled prescription medication.    Tramadol                        >  6944        EXPECTED   ng/mg creat   O-Desmethyltramadol            5594         EXPECTED   ng/mg creat   N-Desmethyltramadol            >6944        EXPECTED   ng/mg creat    Source of tramadol  is a prescription medication. O-desmethyltramadol    and N-desmethyltramadol are expected metabolites of tramadol .  ==================================================================== Test                      Result    Flag   Units      Ref Range   Creatinine              72               mg/dL      >=79 ==================================================================== Declared Medications:  The flagging and interpretation on this report are based on the  following declared medications.  Unexpected results may arise from  inaccuracies in the declared medications.   **Note: The testing scope of this panel includes these medications:   Clonazepam  (Klonopin )  Tramadol  (Ultram )   **Note: The testing scope of this panel does not include the  following reported medications:   Acetaminophen  (Tylenol )  Atorvastatin  (Lipitor)  Budesonide (Symbicort)  Calcium   Celecoxib  (Celebrex )  Ciclopirox  (Loprox )  Clobetasol  (Temovate )  Diphenhydramine  (Benadryl )  Duloxetine  (Cymbalta )  Esomeprazole (Nexium)  Fexofenadine (Allegra)  Fluticasone  (Flonase )  Formoterol  (Symbicort)  Gabapentin  (Neurontin )  Hydralazine  (Apresoline )  Hydrochlorothiazide  (Hyzaar)  Ketoconazole  (Nizoral )  Linaclotide  (Linzess )  Losartan  (Hyzaar)  Methimazole  (Tapazole )  Methocarbamol  (Robaxin )  Multivitamin  Probiotic  Propranolol  (Inderal )  Sucralfate  (Carafate )  Terbinafine  (Lamisil )  Trazodone  (Desyrel )  Triamcinolone  (Kenalog )  Turmeric  Ubiquinone (CoQ10)  Vitamin A  Vitamin B12  Vitamin C   Vitamin D3  Vitamin  E ==================================================================== For clinical consultation, please call [phone]. ====================================================================    No results found for: CBDTHCR, D8THCCBX, D9THCCBX  Laboratory Chemistry Profile   Renal Lab Results  Component Value Date   BUN 12 03/10/2024   CREATININE 0.59 03/10/2024   BCR 11 (L) 04/27/2020   GFRAA 83 04/27/2020   GFRNONAA >60 03/10/2024    Hepatic Lab Results  Component Value Date   AST 22 03/10/2024   ALT 25 03/10/2024   ALBUMIN 4.7 03/10/2024   ALKPHOS 101 03/10/2024   HCVAB NON REACTIVE 02/05/2023   LIPASE 27 12/14/2016    Electrolytes Lab Results  Component Value Date   NA 132 (L) 03/10/2024   K 4.6 03/10/2024   CL 102 03/10/2024   CALCIUM  9.2 03/10/2024   MG 1.9 08/27/2014    Bone No results found for: VD25OH, VD125OH2TOT, CI6874NY7, CI7874NY7, 25OHVITD1, 25OHVITD2, 25OHVITD3, TESTOFREE, TESTOSTERONE  Inflammation (CRP: Acute Phase) (ESR: Chronic Phase) Lab Results  Component Value Date   CRP <0.5 02/05/2023   ESRSEDRATE 6 01/25/2021         Note: Above Lab results reviewed.  Imaging  DG PAIN CLINIC C-ARM 1-60 MIN NO REPORT Fluoro was used, but no Radiologist interpretation will be provided.  Please refer to NOTES tab for provider progress note.  Assessment  The primary encounter diagnosis was SI joint arthritis. Diagnoses of Sacroiliac joint pain, Piriformis syndrome of both sides, and Chronic pain syndrome were also pertinent to this visit.  Plan of Care  Problem-specific:  The patient received BILATERAL Sacroiliac Joint  Steroid Injection and Bilateral piriformis trigger point injection on March 17 2024 with 50% pain relief for approximately 15 days; however her low back pain is back to baseline.  She wants to virtual visit with Dr. Marcelino.   Ms. MENDI CONSTABLE has a current medication list which includes the following  long-term medication(s): amlodipine, atorvastatin , budesonide-formoterol , calcium  carbonate, clonazepam , duloxetine , duloxetine , fluticasone , gabapentin , levocetirizine, linaclotide , methimazole , pregabalin, propranolol , torsemide , and trazodone .  Pharmacotherapy (Medications Ordered): No orders of the defined types were placed in this encounter.  Orders:    Follow-up plan:   Return in about 1 week (around 05/31/2024) for (VV), eval by MD with Dr. Marcelino .        Recent Visits Date Type Provider Dept  03/17/24 Procedure visit Marcelino Nurse, MD [facility]  Showing recent visits within past 90 days and meeting all other requirements Today's Visits Date Type Provider Dept  05/24/24 Office Visit Linzie Criss K, NP [facility]  Showing today's visits and meeting all other requirements Future Appointments No visits were found meeting these conditions. Showing future appointments within next 90 days and meeting all other requirements  I discussed the assessment and treatment plan with the patient. The patient was provided an opportunity to ask questions and all were answered. The patient agreed with the plan and demonstrated an understanding of the instructions.  Patient advised to call back or seek an in-person evaluation if the symptoms or condition worsens.  Duration of encounter: 15 minutes.  Note by: Emmy MARLA Blanch, NP Date: 05/24/2024; Time: 3:14 PM

## 2024-05-24 NOTE — Telephone Encounter (Signed)
 PT wants to know what kind of pain patches can Lateef call in for her pain. Please give patient a call. TY

## 2024-05-25 ENCOUNTER — Telehealth: Admitting: Student in an Organized Health Care Education/Training Program

## 2024-05-25 NOTE — Telephone Encounter (Signed)
 I left a message advising patient to call the office to schedule appt with Seema.

## 2024-05-27 ENCOUNTER — Ambulatory Visit: Admitting: Nurse Practitioner

## 2024-05-31 ENCOUNTER — Telehealth: Payer: Self-pay | Admitting: Student in an Organized Health Care Education/Training Program

## 2024-05-31 NOTE — Telephone Encounter (Signed)
 Error

## 2024-05-31 NOTE — Telephone Encounter (Signed)
 Pt called asking if Christine Higgins will fill out a FL2 Form for her. Please give patient a call. TY

## 2024-06-01 NOTE — Telephone Encounter (Signed)
 Called patient to clarify what kind of form she needs. Message left.

## 2024-06-01 NOTE — Telephone Encounter (Signed)
 I confirmed with patient, she needs an FL2 form. I told her we do not have this at our office. She will have it faxed to our office.

## 2024-06-03 ENCOUNTER — Ambulatory Visit: Admitting: Student in an Organized Health Care Education/Training Program

## 2024-06-10 ENCOUNTER — Encounter: Payer: Self-pay | Admitting: Nurse Practitioner

## 2024-06-10 ENCOUNTER — Ambulatory Visit: Attending: Student in an Organized Health Care Education/Training Program | Admitting: Nurse Practitioner

## 2024-06-10 VITALS — BP 125/89 | HR 86 | Temp 97.2°F | Resp 18 | Ht 71.0 in | Wt 240.0 lb

## 2024-06-10 DIAGNOSIS — G894 Chronic pain syndrome: Secondary | ICD-10-CM | POA: Insufficient documentation

## 2024-06-10 DIAGNOSIS — M461 Sacroiliitis, not elsewhere classified: Secondary | ICD-10-CM | POA: Diagnosis not present

## 2024-06-10 DIAGNOSIS — M5416 Radiculopathy, lumbar region: Secondary | ICD-10-CM | POA: Diagnosis not present

## 2024-06-10 DIAGNOSIS — Z9889 Other specified postprocedural states: Secondary | ICD-10-CM | POA: Diagnosis not present

## 2024-06-10 DIAGNOSIS — G5703 Lesion of sciatic nerve, bilateral lower limbs: Secondary | ICD-10-CM | POA: Diagnosis not present

## 2024-06-10 DIAGNOSIS — M533 Sacrococcygeal disorders, not elsewhere classified: Secondary | ICD-10-CM | POA: Insufficient documentation

## 2024-06-10 DIAGNOSIS — M4802 Spinal stenosis, cervical region: Secondary | ICD-10-CM | POA: Diagnosis not present

## 2024-06-10 MED ORDER — BUPRENORPHINE 5 MCG/HR TD PTWK: 1.0000 | MEDICATED_PATCH | TRANSDERMAL | 0 refills | Status: DC

## 2024-06-10 NOTE — Progress Notes (Signed)
 PROVIDER NOTE: Interpretation of information contained herein should be left to medically-trained personnel. Specific patient instructions are provided elsewhere under Patient Instructions section of medical record. This document was created in part using AI and STT-dictation technology, any transcriptional errors that may result from this process are unintentional.  Patient: Christine Higgins  Service: E/M   PCP: Lenon Layman ORN, MD  DOB: 1956/08/13  DOS: 06/10/2024  Provider: Emmy MARLA Blanch, NP  MRN: [id number]  Delivery: Face-to-face  Specialty: Interventional Pain Management  Type: Established Patient  Setting: Ambulatory outpatient facility  Specialty designation: 09  Referring Prov.: Lenon Layman ORN, MD  Location: Outpatient office facility       History of present illness (HPI) [name], a 68 y.o. year old female, is here today because of her Back pain. Ms. Roussell primary complain today is Back Pain (Radiates down bilateral legs)  Pertinent problems: Ms. Vanderhoef has Failed back surgical syndrome; DDD (degenerative disc disease), lumbosacral; Severe obesity (BMI 35.0-35.9 with comorbidity) (HCC); Primary osteoarthritis of right knee; DM II (diabetes mellitus, type II), controlled (HCC); Chronic pain syndrome; Fibromyalgia; and Lumbar radiculopathy on their pertinent problem list.  Pain Assessment: Severity of Chronic pain is reported as a 9 /10. Location: Back Lower/Bilateral Legs. Onset: More than a month ago. Quality: Stabbing, Throbbing. Timing: Constant. Modifying factor(s): Rest, heat. Vitals:  height is 5' 11 (1.803 m) and weight is 240 lb (108.9 kg). Her temporal temperature is 97.2 F (36.2 C) (abnormal). Her blood pressure is 125/89 and her pulse is 86. Her respiration is 18 and oxygen saturation is 99%.  BMI: Estimated body mass index is 33.47 kg/m as calculated from the following:   Height as of this encounter: 5' 11 (1.803 m).   Weight as of this encounter: 240 lb  (108.9 kg).  Last encounter: 05/24/2024. Last procedure: 03/17/2024  Reason for encounter: evaluation for possible interventional PM therapy/treatment and medication management.  The patient experiences persistent low back pain that radiate to bilateral legs, which is suggestive of sacroiliac joint involvement with associated sciatica.  She is not currently taking any anticoagulant medications.   Of note, the patient previously received SI joint and piriformis trigger point injections, which provided approximately 50% pain relief and improvement lasting for about 2 weeks.  She is currently experiencing a flareup of low back pain and is requesting repeat injections.  Additionally, the patient wishes to discuss medication management.  She prefers to avoid oral medications; therefore, we discussed initiating a Butrans patch to provide steady, around-the-clock pain control. Pharmacotherapy Assessment   Butrans 5 mcg/h patch, 1 patch placed onto the skin once a week. Monitoring: [place] PMP: PDMP reviewed during this encounter.       Pharmacotherapy: No side-effects or adverse reactions reported. Compliance: No problems identified. Effectiveness: Clinically acceptable.  No notes on file  UDS:  Summary  Date Value Ref Range Status  11/15/2021 Note  Final    Comment:    ==================================================================== [employer] Select 13 (MW) ==================================================================== Test                             Result       Flag       Units  Drug Present and Declared for Prescription Verification   7-aminoclonazepam              42           EXPECTED   ng/mg creat  7-aminoclonazepam is an expected metabolite of clonazepam . Source of    clonazepam  is a scheduled prescription medication.    Tramadol                        >6944        EXPECTED   ng/mg creat   O-Desmethyltramadol            5594         EXPECTED   ng/mg creat    N-Desmethyltramadol            >6944        EXPECTED   ng/mg creat    Source of tramadol  is a prescription medication. O-desmethyltramadol    and N-desmethyltramadol are expected metabolites of tramadol .  ==================================================================== Test                      Result    Flag   Units      Ref Range   Creatinine              72               mg/dL      >=79 ==================================================================== Declared Medications:  The flagging and interpretation on this report are based on the  following declared medications.  Unexpected results may arise from  inaccuracies in the declared medications.   **Note: The testing scope of this panel includes these medications:   Clonazepam  (Klonopin )  Tramadol  (Ultram )   **Note: The testing scope of this panel does not include the  following reported medications:   Acetaminophen  (Tylenol )  Atorvastatin  (Lipitor)  Budesonide (Symbicort)  Calcium   Celecoxib  (Celebrex )  Ciclopirox  (Loprox )  Clobetasol  (Temovate )  Diphenhydramine  (Benadryl )  Duloxetine  (Cymbalta )  Esomeprazole (Nexium)  Fexofenadine (Allegra)  Fluticasone  (Flonase )  Formoterol  (Symbicort)  Gabapentin  (Neurontin )  Hydralazine  (Apresoline )  Hydrochlorothiazide  (Hyzaar)  Ketoconazole  (Nizoral )  Linaclotide  (Linzess )  Losartan  (Hyzaar)  Methimazole  (Tapazole )  Methocarbamol  (Robaxin )  Multivitamin  Probiotic  Propranolol  (Inderal )  Sucralfate  (Carafate )  Terbinafine  (Lamisil )  Trazodone  (Desyrel )  Triamcinolone  (Kenalog )  Turmeric  Ubiquinone (CoQ10)  Vitamin A  Vitamin B12  Vitamin C   Vitamin D3  Vitamin E  ==================================================================== For clinical consultation, please call [phone]. ====================================================================     No results found for: CBDTHCR No results found for: D8THCCBX No results found for:  D9THCCBX  ROS  Constitutional: Denies any fever or chills Gastrointestinal: No reported hemesis, hematochezia, vomiting, or acute GI distress Musculoskeletal: low back pain that radiate to bilateral legs Neurological: No reported episodes of acute onset apraxia, aphasia, dysarthria, agnosia, amnesia, paralysis, loss of coordination, or loss of consciousness  Medication Review  Cholecalciferol , DULoxetine , Fluocinolone  Acetonide Body, HYDROcodone -acetaminophen , Methocarbamol , Naloxone  HCl, acetaminophen , albuterol , amLODipine, atorvastatin , budesonide-formoterol , buprenorphine, calcium  carbonate, celecoxib , ciclopirox , clobetasol , clonazePAM , clotrimazole -betamethasone , diclofenac, esomeprazole, fluconazole , fluticasone , gabapentin , hydrocortisone , ibandronate , ketoconazole , levocetirizine, linaclotide , losartan , methimazole , methylPREDNISolone , multivitamin with minerals, pimecrolimus , potassium chloride , pregabalin, propranolol , sucralfate , terbinafine , tirzepatide, torsemide , and traZODone   History Review  Allergy : Ms. Formoso is allergic to cephalexin , ibuprofen, potassium chloride , shellfish allergy , and aspirin. Drug: Ms. Gunnerson  reports that she does not currently use drugs after having used the following drugs: Crack cocaine and Marijuana. Alcohol :  reports no history of alcohol  use. Tobacco:  reports that she quit smoking about 31 years ago. Her smoking use included cigarettes. She has never used smokeless tobacco. Social: Ms. Pusch  reports that she quit smoking about 31 years ago. Her smoking use included cigarettes. She has  never used smokeless tobacco. She reports that she does not currently use drugs after having used the following drugs: Crack cocaine and Marijuana. She reports that she does not drink alcohol . Medical:  has a past medical history of Anxiety, Asthma, Breast mass, Cervical disc disease, Chronic pain syndrome, COPD (chronic obstructive pulmonary disease) (HCC),  Degenerative disc disease, lumbar, Depression, Diabetes mellitus without complication (HCC), Fibromyalgia, Fibromyalgia, GERD (gastroesophageal reflux disease), Glaucoma, Graves disease, Headache, Hemorrhoids, History of hiatal hernia, Hyperlipidemia, Hypertension, Hyperthyroidism, Lumbar disc disease, Obesity, Pre-diabetes, Psoriatic arthritis (HCC), and Thyroid  disease. Surgical: Ms. Roszak  has a past surgical history that includes carpel tunn (Right); Hand surgery (Right); carpel tunnel (Left); Cesarean section; Shoulder surgery (Right); Back surgery; Neck surgery; Total hip arthroplasty (Right); Excision Morton's neuroma (Left, 02/05/2017); Colonoscopy; Joint replacement; Esophagogastroduodenoscopy (egd) with propofol  (N/A, 06/26/2017); Knee Arthroplasty (Left, 08/11/2017); Knee arthroscopy (Right, 07/02/2018); Reverse shoulder arthroplasty (Left, 06/24/2019); Thoracic laminectomy for spinal cord stimulator (N/A, 08/07/2020); Colonoscopy with propofol  (N/A, 10/20/2020); Cardiac catheterization (2006); Tubal ligation; Knee Arthroplasty (Right, 02/05/2021); Spinal cord stimulator removal (N/A, 04/09/2021); Cataract extraction (Bilateral, 2022); Fracture surgery (1969); Lumbar laminectomy/decompression microdiscectomy (N/A, 06/19/2022); and Dilatation & curettage/hysteroscopy with myosure (N/A, 10/24/2022). Family: family history includes Cancer in her mother; Heart disease in her father.  Laboratory Chemistry Profile   Renal Lab Results  Component Value Date   BUN 12 03/10/2024   CREATININE 0.59 03/10/2024   BCR 11 (L) 04/27/2020   GFRAA 83 04/27/2020   GFRNONAA >60 03/10/2024    Hepatic Lab Results  Component Value Date   AST 22 03/10/2024   ALT 25 03/10/2024   ALBUMIN 4.7 03/10/2024   ALKPHOS 101 03/10/2024   HCVAB NON REACTIVE 02/05/2023   LIPASE 27 12/14/2016    Electrolytes Lab Results  Component Value Date   NA 132 (L) 03/10/2024   K 4.6 03/10/2024   CL 102 03/10/2024   CALCIUM   9.2 03/10/2024   MG 1.9 08/27/2014    Bone No results found for: VD25OH, VD125OH2TOT, CI6874NY7, CI7874NY7, 25OHVITD1, 25OHVITD2, 25OHVITD3, TESTOFREE, TESTOSTERONE  Inflammation (CRP: Acute Phase) (ESR: Chronic Phase) Lab Results  Component Value Date   CRP <0.5 02/05/2023   ESRSEDRATE 6 01/25/2021         Note: Above Lab results reviewed.  Recent Imaging Review  DG PAIN CLINIC C-ARM 1-60 MIN NO REPORT Fluoro was used, but no Radiologist interpretation will be provided.  Please refer to NOTES tab for provider progress note. Note: Reviewed        Physical Exam  Vitals: BP 125/89 (BP Location: Right Arm, Patient Position: Sitting, Cuff Size: Normal)   Pulse 86   Temp (!) 97.2 F (36.2 C) (Temporal)   Resp 18   Ht 5' 11 (1.803 m)   Wt 240 lb (108.9 kg)   SpO2 99%   BMI 33.47 kg/m  BMI: Estimated body mass index is 33.47 kg/m as calculated from the following:   Height as of this encounter: 5' 11 (1.803 m).   Weight as of this encounter: 240 lb (108.9 kg). Ideal: Ideal body weight: 70.8 kg (156 lb 1.4 oz) Adjusted ideal body weight: 86 kg (189 lb 10.4 oz) General appearance: Well nourished, well developed, and well hydrated. In no apparent acute distress Mental status: Alert, oriented x 3 (person, place, & time)       Respiratory: No evidence of acute respiratory distress Eyes: PERLA  Musculoskeletal: +LBP Lumbar Exam  Skin & Axial Inspection: No masses, redness, or swelling Alignment: Symmetrical Functional ROM: Pain  restricted ROM affecting both sides Stability: No instability detected Muscle Tone/Strength: Functionally intact. No obvious neuro-muscular anomalies detected. Sensory (Neurological): Musculoskeletal pain pattern Palpation: No palpable anomalies       Provocative Tests: Hyperextension/rotation test: deferred today       Lumbar quadrant test (Kemp's test): deferred today       Lateral bending test: deferred today       Patrick's  Maneuver: (+) for bilateral S-I arthralgia        FABER* test: (+) for bilateral S-I arthralgia  S-I anterior distraction/compression test: (+) bilateral S-I arthralgia/arthropathy S-I lateral compression test: (+) bilateral S-I arthralgia/arthropathy S-I Thigh-thrust test: (+) bilateral S-I arthralgia/arthropathy S-I Gaenslen's test: (+) bilateral S-I arthralgia/arthropathy *(Flexion, ABduction and External Rotation) Assessment   Diagnosis Status  1. SI joint arthritis   2. Sacroiliac joint pain   3. Piriformis syndrome of both sides   4. Chronic pain syndrome   5. Lumbar radiculopathy   6. History of lumbar laminectomy for spinal cord decompression   7. Foraminal stenosis of cervical region    Having a Flare-up Having a Flare-up Having a Flare-up   Updated Problems: No problems updated.  Plan of Care  Problem-specific:  Assessment and Plan SI joint arthritis: The patient continues experiencing SI joint pain.  We discussed to repeat SI joint and piriformis trigger point injection.  Sacroiliac joint pain: Initiating Butrans 5 mcg/h patch, 1 patch placed onto the skin once a week.   Plan: (Clinic): (B) B/L SIJ + Piriformis TPI, in clinic with Dr. Marcelino   [name] has a current medication list which includes the following long-term medication(s): amlodipine, atorvastatin , budesonide-formoterol , calcium  carbonate, clonazepam , duloxetine , duloxetine , fluticasone , gabapentin , levocetirizine, linaclotide , methimazole , propranolol , torsemide , trazodone , and pregabalin.  Pharmacotherapy (Medications Ordered): Meds ordered this encounter  Medications   buprenorphine (BUTRANS) 5 MCG/HR PTWK    Sig: Place 1 patch onto the skin once a week for 28 days.    Dispense:  4 patch    Refill:  0   Orders:  Orders Placed This Encounter  Procedures   SACROILIAC JOINT INJECTION    Physical Examination Findings: Positive Sacral Thrust (Sacral Spring, Downward Pressure):  (Y) Positive FABER maneuver (Patrick's): (Y) Positive SI distraction (Gapping): (Y) Positive SI compression (Approximation): (Y) Positive Thigh Thrust:  (Y) Positive Gaenslen's: (Y) Positive Sacral Sulcus Tenderness: (Y)    Standing Status:   Future    Expiration Date:   09/10/2024    Scheduling Instructions:     Procedure: Sacroiliac Joint Injection     Side  Laterality: Bilateral     Sedation: Patient's choice.     Timeframe: As soon as schedule allows.    Where will this procedure be performed?:   [facility]   TRIGGER POINT INJECTION    Area: Buttocks region (gluteal area) Indications: Piriformis muscle pain; Bilateral (G57.03) piriformis-syndrome; piriformis muscle spasms (F37.161). CPT code: 79447    Scheduling Instructions:     Type: Myoneural block (TPI) of piriformis muscle.     Side: Bilateral     Sedation: Patient's choice.     Timeframe: ASAA    Where will this procedure be performed?:   [facility]        Return in about 3 weeks (around 07/01/2024) for [facility]): (B) B/L SIJ + Piriformis TPI, in clinic with Dr. Marcelino .    Recent Visits Date Type Provider Dept  05/24/24 Office Visit Ave Scharnhorst K, NP [facility]  03/17/24 Procedure visit  Marcelino Nurse, MD [facility]  Showing recent visits within past 90 days and meeting all other requirements Today's Visits Date Type Provider Dept  06/10/24 Office Visit Saretta Dahlem K, NP [facility]  Showing today's visits and meeting all other requirements Future Appointments No visits were found meeting these conditions. Showing future appointments within next 90 days and meeting all other requirements  I discussed the assessment and treatment plan with the patient. The patient was provided an opportunity to ask questions and all were answered. The patient agreed with the plan and demonstrated an understanding of the instructions.  Patient advised to call back or seek an  in-person evaluation if the symptoms or condition worsens.  Duration of encounter: 30 minutes.  Total time on encounter, as per AMA guidelines included both the face-to-face and non-face-to-face time personally spent by the physician and/or other qualified health care professional(s) on the day of the encounter (includes time in activities that require the physician or other qualified health care professional and does not include time in activities normally performed by clinical staff). Physician's time may include the following activities when performed: Preparing to see the patient (e.g., pre-charting review of records, searching for previously ordered imaging, lab work, and nerve conduction tests) Review of prior analgesic pharmacotherapies. Reviewing PMP Interpreting ordered tests (e.g., lab work, imaging, nerve conduction tests) Performing post-procedure evaluations, including interpretation of diagnostic procedures Obtaining and/or reviewing separately obtained history Performing a medically appropriate examination and/or evaluation Counseling and educating the patient/family/caregiver Ordering medications, tests, or procedures Referring and communicating with other health care professionals (when not separately reported) Documenting clinical information in the electronic or other health record Independently interpreting results (not separately reported) and communicating results to the patient/ family/caregiver Care coordination (not separately reported)  Note by: Kaevion Sinclair K Kerrie Latour, NP (TTS and AI technology used. I apologize for any typographical errors that were not detected and corrected.) Date: 06/10/2024; Time: 2:29 PM

## 2024-06-14 DIAGNOSIS — R300 Dysuria: Secondary | ICD-10-CM | POA: Diagnosis not present

## 2024-06-14 DIAGNOSIS — M461 Sacroiliitis, not elsewhere classified: Secondary | ICD-10-CM | POA: Diagnosis not present

## 2024-06-15 ENCOUNTER — Telehealth: Payer: Self-pay | Admitting: Student in an Organized Health Care Education/Training Program

## 2024-06-15 NOTE — Telephone Encounter (Signed)
 Whoever filled out the papers for [facility] please call her. She needs something changed.

## 2024-06-15 NOTE — Telephone Encounter (Signed)
 Spoke with patient, needed correction on [facility] Form. Form corrected and refaxed to [phone] and sent for scanning.

## 2024-06-21 ENCOUNTER — Telehealth: Payer: Self-pay

## 2024-06-21 ENCOUNTER — Other Ambulatory Visit: Payer: Self-pay | Admitting: Dermatology

## 2024-06-21 DIAGNOSIS — B351 Tinea unguium: Secondary | ICD-10-CM

## 2024-06-21 NOTE — Telephone Encounter (Signed)
 Patient called asking to speak to Christine Higgins. She states she needs a new form filled out and for Dr. Marcelino to sign. She wants you to call her tomorrow please.

## 2024-06-21 NOTE — Telephone Encounter (Signed)
 Patient called asking if Dr Jackquline can drain the area on her arm, discussed with patient she is scheduled for surgery removal of a cyst vs lipoma so we can not drain this area,

## 2024-06-28 ENCOUNTER — Other Ambulatory Visit (HOSPITAL_COMMUNITY): Payer: Self-pay | Admitting: Rheumatology

## 2024-06-30 ENCOUNTER — Telehealth: Payer: Self-pay | Admitting: Student in an Organized Health Care Education/Training Program

## 2024-06-30 NOTE — Telephone Encounter (Signed)
 Pt stated that the Pain Patches that she has is making her sick.

## 2024-06-30 NOTE — Telephone Encounter (Signed)
 LM for the patient to call us  back and give symptoms.  I also instructed her that she could discontinue the patch and to call and make an appointment if she needed to come earlier.

## 2024-07-05 ENCOUNTER — Telehealth (HOSPITAL_COMMUNITY): Payer: Self-pay

## 2024-07-05 ENCOUNTER — Ambulatory Visit
Admission: RE | Admit: 2024-07-05 | Discharge: 2024-07-05 | Disposition: A | Source: Ambulatory Visit | Attending: Student in an Organized Health Care Education/Training Program | Admitting: Student in an Organized Health Care Education/Training Program

## 2024-07-05 ENCOUNTER — Encounter: Payer: Self-pay | Admitting: Student in an Organized Health Care Education/Training Program

## 2024-07-05 ENCOUNTER — Ambulatory Visit (HOSPITAL_BASED_OUTPATIENT_CLINIC_OR_DEPARTMENT_OTHER): Admitting: Student in an Organized Health Care Education/Training Program

## 2024-07-05 VITALS — BP 142/90 | HR 81 | Temp 97.4°F | Resp 18 | Ht 71.0 in | Wt 232.0 lb

## 2024-07-05 DIAGNOSIS — M47898 Other spondylosis, sacral and sacrococcygeal region: Secondary | ICD-10-CM | POA: Insufficient documentation

## 2024-07-05 DIAGNOSIS — M461 Sacroiliitis, not elsewhere classified: Secondary | ICD-10-CM | POA: Insufficient documentation

## 2024-07-05 DIAGNOSIS — M7918 Myalgia, other site: Secondary | ICD-10-CM | POA: Diagnosis not present

## 2024-07-05 DIAGNOSIS — M533 Sacrococcygeal disorders, not elsewhere classified: Secondary | ICD-10-CM

## 2024-07-05 DIAGNOSIS — M545 Low back pain, unspecified: Secondary | ICD-10-CM | POA: Insufficient documentation

## 2024-07-05 DIAGNOSIS — G5703 Lesion of sciatic nerve, bilateral lower limbs: Secondary | ICD-10-CM | POA: Diagnosis not present

## 2024-07-05 MED ORDER — ROPIVACAINE HCL 2 MG/ML IJ SOLN
18.0000 mL | Freq: Once | INTRAMUSCULAR | Status: AC
  Administered 2024-07-05: 18 mL via PERINEURAL
  Filled 2024-07-05: qty 20

## 2024-07-05 MED ORDER — IOHEXOL 180 MG/ML  SOLN
10.0000 mL | Freq: Once | INTRAMUSCULAR | Status: AC
  Administered 2024-07-05: 10 mL via INTRA_ARTICULAR
  Filled 2024-07-05: qty 20

## 2024-07-05 MED ORDER — METHYLPREDNISOLONE ACETATE 80 MG/ML IJ SUSP
80.0000 mg | Freq: Once | INTRAMUSCULAR | Status: AC
  Administered 2024-07-05: 80 mg via INTRA_ARTICULAR
  Filled 2024-07-05: qty 1

## 2024-07-05 MED ORDER — BUPRENORPHINE 10 MCG/HR TD PTWK: 1.0000 | MEDICATED_PATCH | TRANSDERMAL | 2 refills | Status: AC

## 2024-07-05 MED ORDER — LIDOCAINE HCL 2 % IJ SOLN
20.0000 mL | Freq: Once | INTRAMUSCULAR | Status: AC
  Administered 2024-07-05: 400 mg
  Filled 2024-07-05: qty 40

## 2024-07-05 MED ORDER — DEXAMETHASONE SOD PHOSPHATE PF 10 MG/ML IJ SOLN
10.0000 mg | Freq: Once | INTRAMUSCULAR | Status: AC
  Administered 2024-07-05: 10 mg

## 2024-07-05 NOTE — Telephone Encounter (Signed)
 Auth Submission: APPROVED Site of care: Site of care: [facility] Payer: [employer] Medication & CPT/J Code(s) submitted: Cosentyx (G6752) Diagnosis Code: L40.9 Route of submission (phone, fax, portal): portal Phone # Fax # Auth type: Buy/Bill HB Units/visits requested: 1.75mg /kg every 4 weeks Reference number: [id number] Approval from: 06/10/24 to 08/25/25    Approval letter scanned into media tab.

## 2024-07-05 NOTE — Progress Notes (Signed)
 Safety precautions to be maintained throughout the outpatient stay will include: orient to surroundings, keep bed in low position, maintain call bell within reach at all times, provide assistance with transfer out of bed and ambulation.

## 2024-07-05 NOTE — Progress Notes (Signed)
 PROVIDER NOTE: Information contained herein reflects review and annotations entered in association with encounter. Interpretation of such information and data should be left to medically-trained personnel. Information provided to patient can be located elsewhere in the medical record under Patient Instructions. Document created using STT-dictation technology, any transcriptional errors that may result from process are unintentional.    Patient: Christine Higgins  Service Category: Procedure  Provider: Wallie Sherry, MD  DOB: March 27, 1956  DOS: 07/05/2024  Location: [facility]  MRN: [id number]  Setting: Ambulatory - outpatient  Referring Provider: Tobie Emmy POUR, NP  Type: Established Patient  Specialty: Interventional Pain Management  PCP: Christine Layman ORN, MD   Primary Reason for Visit: Interventional Pain Management Treatment. CC: SI joint and buttock pain    Procedure:          Anesthesia, Analgesia, Anxiolysis:  Type: Therapeutic BILATERAL Sacroiliac Joint Steroid Injection and Bilateral piriformis trigger point injection  Region: Inferior Lumbosacral Region Level: PIIS (Posterior Inferior Iliac Spine) Laterality: Bilateral  Type: Local Anesthesia  Local Anesthetic: Lidocaine  1-2%    Position: Prone           Indications: 1. SI joint arthritis   2. Sacroiliac joint pain    Pain Score: Pre-procedure: 9 /10 Post-procedure: 9 /10     Pre-op H&P Assessment:  Christine Higgins is a 68 y.o. (year old), female patient, seen today for interventional treatment. She  has a past surgical history that includes carpel tunn (Right); Hand surgery (Right); carpel tunnel (Left); Cesarean section; Shoulder surgery (Right); Back surgery; Neck surgery; Total hip arthroplasty (Right); Excision Morton's neuroma (Left, 02/05/2017); Colonoscopy; Joint replacement; Esophagogastroduodenoscopy (egd) with propofol  (N/A, 06/26/2017); Knee Arthroplasty (Left, 08/11/2017); Knee arthroscopy (Right,  07/02/2018); Reverse shoulder arthroplasty (Left, 06/24/2019); Thoracic laminectomy for spinal cord stimulator (N/A, 08/07/2020); Colonoscopy with propofol  (N/A, 10/20/2020); Cardiac catheterization (2006); Tubal ligation; Knee Arthroplasty (Right, 02/05/2021); Spinal cord stimulator removal (N/A, 04/09/2021); Cataract extraction (Bilateral, 2022); Fracture surgery (1969); Lumbar laminectomy/decompression microdiscectomy (N/A, 06/19/2022); and Dilatation & curettage/hysteroscopy with myosure (N/A, 10/24/2022). Christine Higgins has a current medication list which includes the following prescription(s): acetaminophen , albuterol , amlodipine, atorvastatin , budesonide-formoterol , buprenorphine, calcium  carbonate, celecoxib , vitamin d3 super strength, ciclopirox , ciclopirox , clobetasol , clonazepam , clotrimazole -betamethasone , diclofenac, diclofenac, duloxetine , duloxetine , esomeprazole, fluconazole , fluocinolone  acetonide body, fluticasone , gabapentin , hydrocodone -acetaminophen , hydrocortisone , ibandronate , ketoconazole , levocetirizine, linaclotide , losartan , methimazole , methocarbamol , mounjaro, multivitamin with minerals, naloxone  hcl, pimecrolimus , potassium chloride , propranolol , sucralfate , terbinafine , torsemide , and trazodone . Her primarily concern today is the Back Pain (lower)   Initial Vital Signs:  Pulse/HCG Rate: 81ECG Heart Rate: 79 Temp: (!) 97.4 F (36.3 C) Resp: 18 BP: 112/74 SpO2: 100 %  BMI: Estimated body mass index is 32.36 kg/m as calculated from the following:   Height as of this encounter: 5' 11 (1.803 m).   Weight as of this encounter: 232 lb (105.2 kg).  Risk Assessment: Allergies: Reviewed. She is allergic to cephalexin , ibuprofen, potassium chloride , shellfish allergy , and aspirin.  Allergy  Precautions: None required Coagulopathies: Reviewed. None identified.  Blood-thinner therapy: None at this time Active Infection(s): Reviewed. None identified. Christine Higgins is afebrile  Site  Confirmation: Christine Higgins was asked to confirm the procedure and laterality before marking the site Procedure checklist: Completed Consent: Before the procedure and under the influence of no sedative(s), amnesic(s), or anxiolytics, the patient was informed of the treatment options, risks and possible complications. To fulfill our ethical and legal obligations, as recommended by the American Medical Association's Code of Ethics, I have informed the patient of my clinical impression; the  nature and purpose of the treatment or procedure; the risks, benefits, and possible complications of the intervention; the alternatives, including doing nothing; the risk(s) and benefit(s) of the alternative treatment(s) or procedure(s); and the risk(s) and benefit(s) of doing nothing. The patient was provided information about the general risks and possible complications associated with the procedure. These may include, but are not limited to: failure to achieve desired goals, infection, bleeding, organ or nerve damage, allergic reactions, paralysis, and death. In addition, the patient was informed of those risks and complications associated to the procedure, such as failure to decrease pain; infection; bleeding; organ or nerve damage with subsequent damage to sensory, motor, and/or autonomic systems, resulting in permanent pain, numbness, and/or weakness of one or several areas of the body; allergic reactions; (i.e.: anaphylactic reaction); and/or death. Furthermore, the patient was informed of those risks and complications associated with the medications. These include, but are not limited to: allergic reactions (i.e.: anaphylactic or anaphylactoid reaction(s)); adrenal axis suppression; blood sugar elevation that in diabetics may result in ketoacidosis or comma; water retention that in patients with history of congestive heart failure may result in shortness of breath, pulmonary edema, and decompensation with resultant heart  failure; weight gain; swelling or edema; medication-induced neural toxicity; particulate matter embolism and blood vessel occlusion with resultant organ, and/or nervous system infarction; and/or aseptic necrosis of one or more joints. Finally, the patient was informed that Medicine is not an exact science; therefore, there is also the possibility of unforeseen or unpredictable risks and/or possible complications that may result in a catastrophic outcome. The patient indicated having understood very clearly. We have given the patient no guarantees and we have made no promises. Enough time was given to the patient to ask questions, all of which were answered to the patient's satisfaction. Ms. Geisel has indicated that she wanted to continue with the procedure. Attestation: I, the ordering provider, attest that I have discussed with the patient the benefits, risks, side-effects, alternatives, likelihood of achieving goals, and potential problems during recovery for the procedure that I have provided informed consent. Date  Time: 07/05/2024  1:01 PM  Pre-Procedure Preparation:  Monitoring: As per clinic protocol. Respiration, ETCO2, SpO2, BP, heart rate and rhythm monitor placed and checked for adequate function Safety Precautions: Patient was assessed for positional comfort and pressure points before starting the procedure. Time-out: I initiated and conducted the Time-out before starting the procedure, as per protocol. The patient was asked to participate by confirming the accuracy of the Time Out information. Verification of the correct person, site, and procedure were performed and confirmed by me, the nursing staff, and the patient. Time-out conducted as per Joint Commission's Universal Protocol (UP.01.01.01). Time: 1435  Description of Procedure:          Target Area: Inferior, posterior, aspect of the sacroiliac fissure Approach: Posterior, paraspinal, ipsilateral approach. Area Prepped: Entire  Lower Lumbosacral Region DuraPrep (Iodine  Povacrylex [0.7% available iodine ] and Isopropyl Alcohol , 74% w/w) Safety Precautions: Aspiration looking for blood return was conducted prior to all injections. At no point did we inject any substances, as a needle was being advanced. No attempts were made at seeking any paresthesias. Safe injection practices and needle disposal techniques used. Medications properly checked for expiration dates. SDV (single dose vial) medications used. Description of the Procedure: Protocol guidelines were followed. The patient was placed in position over the procedure table. The target area was identified and the area prepped in the usual manner. Skin & deeper tissues infiltrated with  local anesthetic. Appropriate amount of time allowed to pass for local anesthetics to take effect. The procedure needle was advanced under fluoroscopic guidance into the sacroiliac joint until a firm endpoint was obtained. Proper needle placement secured. Negative aspiration confirmed. Solution injected in intermittent fashion, asking for systemic symptoms every 0.5cc of injectate. The needles were then removed and the area cleansed, making sure to leave some of the prepping solution back to take advantage of its long term bactericidal properties. Vitals:   07/05/24 1341 07/05/24 1435 07/05/24 1442  BP: 112/74 137/88 (!) 142/90  Pulse: 81    Resp:  18 18  Temp: (!) 97.4 F (36.3 C)    SpO2: 100% 100% 100%  Weight: 232 lb (105.2 kg)    Height: 5' 11 (1.803 m)       Start Time: 1435 hrs. End Time: 1442 hrs. Materials:  Needle(s) Type: Spinal Needle Gauge: 22G Length: 3.5-in Medication(s): Please see orders for medications and dosing details. 5cc solution made of 4.5 cc of 0.2% ropivacaine , 0.5 cc of methylprednisolone , 80 mg/cc.  5 cc injected into right SI-J after contrast confirmation. 5cc solution made of 4.5 cc of 0.2% ropivacaine , 0.5 cc of methylprednisolone , 80 mg/cc.  5 cc  injected into left SI-J after contrast confirmation.  Afterwards RIGHT & LEFT  piriformis trigger point injections were also performed.  1 cm inferior, 1 cm deep, 1 cm lateral to the inferior sacroiliac fissure, the piriformis muscle was highlighted under contrast with muscle striations visible. 4 cc solution consisting of 3.5 cc of 0.2% ropivacaine , 0.5 cc of Decadron  10 mg/cc.  4 cc injected into  right piriformis muscle after contrast confirmation of muscle striations.  No radiating leg pain during injection. 4 cc solution consisting of 3.5 cc of 0.2% ropivacaine , 0.5 cc of Decadron  10 mg/cc.  4 cc injected into  left piriformis muscle after contrast confirmation of muscle striations.  No radiating leg pain during injection.  Imaging Guidance (Non-Spinal):          Type of Imaging Technique: Fluoroscopy Guidance (Non-Spinal) Indication(s): Assistance in needle guidance and placement for procedures requiring needle placement in or near specific anatomical locations not easily accessible without such assistance. Exposure Time: Please see nurses notes. Contrast: Before injecting any contrast, we confirmed that the patient did not have an allergy  to iodine , shellfish, or radiological contrast. Once satisfactory needle placement was completed at the desired level, radiological contrast was injected. Contrast injected under live fluoroscopy. No contrast complications. See chart for type and volume of contrast used. Fluoroscopic Guidance: I was personally present during the use of fluoroscopy. Tunnel Vision Technique used to obtain the best possible view of the target area. Parallax error corrected before commencing the procedure. Direction-depth-direction technique used to introduce the needle under continuous pulsed fluoroscopy. Once target was reached, antero-posterior, oblique, and lateral fluoroscopic projection used confirm needle placement in all planes. Images permanently stored in  EMR. Interpretation: I personally interpreted the imaging intraoperatively. Adequate needle placement confirmed in multiple planes. Appropriate spread of contrast into desired area was observed. No evidence of afferent or efferent intravascular uptake. Permanent images saved into the patient's record.   Post-operative Assessment:  Post-procedure Vital Signs:  Pulse/HCG Rate: 8180 Temp: (!) 97.4 F (36.3 C) Resp: 18 BP: (!) 142/90 SpO2: 100 %  EBL: None  Complications: No immediate post-treatment complications observed by team, or reported by patient.  Note: The patient tolerated the entire procedure well. A repeat set of vitals were taken after the procedure  and the patient was kept under observation following institutional policy, for this type of procedure. Post-procedural neurological assessment was performed, showing return to baseline, prior to discharge. The patient was provided with post-procedure discharge instructions, including a section on how to identify potential problems. Should any problems arise concerning this procedure, the patient was given instructions to immediately contact us , at any time, without hesitation. In any case, we plan to contact the patient by telephone for a follow-up status report regarding this interventional procedure.  Comments:  No additional relevant information.  Plan of Care  Orders:  Orders Placed This Encounter  Procedures   DG PAIN CLINIC C-ARM 1-60 MIN NO REPORT    Intraoperative interpretation by procedural physician at [facility].    Standing Status:   Standing    Number of Occurrences:   1    Reason for exam::   Assistance in needle guidance and placement for procedures requiring needle placement in or near specific anatomical locations not easily accessible without such assistance.    Patient states that she is not having analgesic benefit with Butrans patch at 5 mcg an hour, she has been on it for almost 3 weeks.  No side  effects.  Increase Butrans dose to 10 mcg an hour.  PMP checked and reviewed.  Medications ordered for procedure: Meds ordered this encounter  Medications   lidocaine  (XYLOCAINE ) 2 % (with pres) injection 400 mg   iohexol  (OMNIPAQUE ) 180 MG/ML injection 10 mL    Must be Myelogram-compatible. If not available, you may substitute with a water-soluble, non-ionic, hypoallergenic, myelogram-compatible radiological contrast medium.   ropivacaine  (PF) 2 mg/mL (0.2%) (NAROPIN ) injection 18 mL   methylPREDNISolone  acetate (DEPO-MEDROL ) injection 80 mg   dexamethasone  (DECADRON ) injection 10 mg   buprenorphine (BUTRANS) 10 MCG/HR PTWK    Sig: Place 1 patch onto the skin once a week.    Dispense:  4 patch    Refill:  2    Chronic Pain: STOP Act (Not applicable) Fill 1 day early if closed on refill date. Avoid benzodiazepines within 8 hours of opioids    Medications administered: We administered lidocaine , iohexol , ropivacaine  (PF) 2 mg/mL (0.2%), methylPREDNISolone  acetate, and dexamethasone .  See the medical record for exact dosing, route, and time of administration.  Follow-up plan:   Return in about 10 weeks (around 09/13/2024) for Emmy Blanch, MM and PPE.     Recent Visits Date Type Provider Dept  06/10/24 Office Visit Patel, Seema K, NP [facility]  05/24/24 Office Visit Patel, Seema K, NP [facility]  Showing recent visits within past 90 days and meeting all other requirements Today's Visits Date Type Provider Dept  07/05/24 Procedure visit Marcelino Nurse, MD [facility]  Showing today's visits and meeting all other requirements Future Appointments Date Type Provider Dept  09/16/24 Appointment Patel, Seema K, NP [facility]  Showing future appointments within next 90 days and meeting all other requirements  Disposition: Discharge home  Discharge (Date  Time): 07/05/2024; 1450 hrs.   Primary Care Physician: Christine Layman ORN, MD Location:  [facility] Note by: Nurse Marcelino, MD Date: 07/05/2024; Time: 2:59 PM  Disclaimer:  Medicine is not an exact science. The only guarantee in medicine is that nothing is guaranteed. It is important to note that the decision to proceed with this intervention was based on the information collected from the patient. The Data and conclusions were drawn from the patient's questionnaire, the interview, and the  physical examination. Because the information was provided in large part by the patient, it cannot be guaranteed that it has not been purposely or unconsciously manipulated. Every effort has been made to obtain as much relevant data as possible for this evaluation. It is important to note that the conclusions that lead to this procedure are derived in large part from the available data. Always take into account that the treatment will also be dependent on availability of resources and existing treatment guidelines, considered by other Pain Management Practitioners as being common knowledge and practice, at the time of the intervention. For Medico-Legal purposes, it is also important to point out that variation in procedural techniques and pharmacological choices are the acceptable norm. The indications, contraindications, technique, and results of the above procedure should only be interpreted and judged by a Board-Certified Interventional Pain Specialist with extensive familiarity and expertise in the same exact procedure and technique.

## 2024-07-06 ENCOUNTER — Telehealth: Payer: Self-pay

## 2024-07-06 NOTE — Telephone Encounter (Signed)
Post procedure follow up.  No answer.  

## 2024-07-07 ENCOUNTER — Encounter: Admitting: Dermatology

## 2024-07-07 ENCOUNTER — Telehealth (HOSPITAL_COMMUNITY): Payer: Self-pay | Admitting: Rheumatology

## 2024-07-07 NOTE — Telephone Encounter (Signed)
 Patient referred to infusion pharmacy team for ambulatory infusion of Cosentyx.  Patient is scheduled to receive Cosentyx infusion on 07/20/24. Last TB Gold test 12/22/19. Spoke with provider's office, advised patient will need TB Gold test prior to infusion. Provider's office will schedule test and send results prior to infusion.  Thank you,  Norton Blush, PharmD, [facility]

## 2024-07-14 DIAGNOSIS — M797 Fibromyalgia: Secondary | ICD-10-CM | POA: Diagnosis not present

## 2024-07-14 DIAGNOSIS — M150 Primary generalized (osteo)arthritis: Secondary | ICD-10-CM | POA: Diagnosis not present

## 2024-07-14 DIAGNOSIS — L409 Psoriasis, unspecified: Secondary | ICD-10-CM | POA: Diagnosis not present

## 2024-07-14 DIAGNOSIS — Z79899 Other long term (current) drug therapy: Secondary | ICD-10-CM | POA: Diagnosis not present

## 2024-07-14 DIAGNOSIS — L4050 Arthropathic psoriasis, unspecified: Secondary | ICD-10-CM | POA: Diagnosis not present

## 2024-07-20 ENCOUNTER — Telehealth (HOSPITAL_COMMUNITY): Payer: Self-pay

## 2024-07-20 ENCOUNTER — Ambulatory Visit (HOSPITAL_COMMUNITY)
Admission: RE | Admit: 2024-07-20 | Discharge: 2024-07-20 | Disposition: A | Discharge status: Home / Self Care | Source: Ambulatory Visit | Attending: Rheumatology | Admitting: Rheumatology

## 2024-07-20 DIAGNOSIS — L4050 Arthropathic psoriasis, unspecified: Secondary | ICD-10-CM | POA: Diagnosis not present

## 2024-07-20 NOTE — Telephone Encounter (Signed)
 Patient referred to infusion pharmacy team for ambulatory infusion of Cosentyx.   TB Gold test not collected prior to appointment today. Spoke with patient about risks of infusion without TB results. Patient will have TB test collected today at lab located at provider's office. Infusion appointment will be rescheduled.  Thank you,  Norton Blush, PharmD, [facility]

## 2024-07-21 ENCOUNTER — Inpatient Hospital Stay
Admission: RE | Admit: 2024-07-21 | Discharge: 2024-07-21 | Disposition: A | Discharge status: Home / Self Care | Payer: Self-pay | Source: Ambulatory Visit | Attending: Physician Assistant | Admitting: Physician Assistant

## 2024-07-21 ENCOUNTER — Encounter: Payer: Self-pay | Admitting: Family Medicine

## 2024-07-21 DIAGNOSIS — Z049 Encounter for examination and observation for unspecified reason: Secondary | ICD-10-CM

## 2024-07-21 NOTE — Progress Notes (Unsigned)
 Referring Physician:  Kathlynn Sharper, MD [address] [facility] [place]  [place] [address]  Primary Physician:  Lenon Layman ORN, MD  History of Present Illness: 07/28/2024 [name] is here today with a chief complaint of pain radiating down both her extremities.  Patient with previous PLIF and spinal cord stimulator placement which was taken out 3 years ago.  She states that she has a burning pain in her buttock that radiates down the side of her leg to the top of her foot on both legs.  1 side is not worse than the other.  She has been using a walker and rollator to ambulate.  She had a fall in August and has had increased pain since the incident.  She did have an injection about 1 month ago at her SI joint/piriformis which helped for about 1 week.  No new saddle anesthesia or incontinence has been noted.      Bowel/Bladder Dysfunction: none  Conservative measures:  Physical therapy: Has participated in 04/2022 for lower back. Multimodal medical therapy including regular antiinflammatories: Tylenol , Cymbalta , Diclofenac, Gabapentin , Hydrocodone , Methocarbamol  Injections: 07/05/2024  BILATERAL Sacroiliac Joint Steroid Injection and Bilateral piriformis trigger point injection  03/17/2024 BILATERAL Sacroiliac Joint Steroid Injection and Bilateral piriformis trigger point injection   Past Surgery: 06/17/2022 L3,4 Lumbar fusion, Right L2-3 Discectomy, L4-5 Laminectomy 04/09/2021 Removal SCS 08/07/2020 Thoracic SCS  Christine Higgins has no symptoms of cervical myelopathy.  The symptoms are causing a significant impact on the patient's life.   Review of Systems:  A 10 point review of systems is negative, except for the pertinent positives and negatives detailed in the HPI.  Past Medical History: Past Medical History:  Diagnosis Date   Anxiety    Asthma    Breast mass    LEFT x 3 months per pt and palpated by physician   Cervical disc disease     Chronic pain syndrome    COPD (chronic obstructive pulmonary disease) (HCC)    Degenerative disc disease, lumbar    osteoarthritis   Depression    Diabetes mellitus without complication (HCC)    Fibromyalgia    Fibromyalgia    GERD (gastroesophageal reflux disease)    Glaucoma    Graves disease    Headache    migraines   Hemorrhoids    History of hiatal hernia    Hyperlipidemia    Hypertension    Hyperthyroidism    Lumbar disc disease    Obesity    Pre-diabetes    Psoriatic arthritis (HCC)    Thyroid  disease     Past Surgical History: Past Surgical History:  Procedure Laterality Date   BACK SURGERY     sumbar   CARDIAC CATHETERIZATION  2006   carpel tunn Right    carpel tunnel Left    CATARACT EXTRACTION Bilateral 2022   CESAREAN SECTION     COLONOSCOPY     COLONOSCOPY WITH PROPOFOL  N/A 10/20/2020   Procedure: COLONOSCOPY WITH PROPOFOL ;  Surgeon: Maryruth Ole DASEN, MD;  Location: [facility];  Service: Endoscopy;  Laterality: N/A;   DILATATION & CURETTAGE/HYSTEROSCOPY WITH MYOSURE N/A 10/24/2022   Procedure: DILATATION & CURETTAGE/HYSTEROSCOPY;  Surgeon: Schermerhorn, Debby PARAS, MD;  Location: [facility];  Service: Gynecology;  Laterality: N/A;   ESOPHAGOGASTRODUODENOSCOPY (EGD) WITH PROPOFOL  N/A 06/26/2017   Procedure: ESOPHAGOGASTRODUODENOSCOPY (EGD) WITH PROPOFOL ;  Surgeon: Gaylyn Gladis PENNER, MD;  Location: [facility] ENDOSCOPY;  Service: Endoscopy;  Laterality: N/A;   EXCISION MORTON'S NEUROMA Left 02/05/2017  Procedure: EXCISION MORTON'S NEUROMA;  Surgeon: Ashley Soulier, DPM;  Location: [facility];  Service: Podiatry;  Laterality: Left;  iva with local   FRACTURE SURGERY  1969   Left Elbow   HAND SURGERY Right    scar tissue removal   JOINT REPLACEMENT     right hip arthroplasty 08/25/15   KNEE ARTHROPLASTY Left 08/11/2017   Procedure: COMPUTER ASSISTED TOTAL KNEE ARTHROPLASTY;  Surgeon: Mardee Lynwood SQUIBB, MD;  Location: [facility];  Service: Orthopedics;   Laterality: Left;   KNEE ARTHROPLASTY Right 02/05/2021   Procedure: COMPUTER ASSISTED TOTAL KNEE ARTHROPLASTY;  Surgeon: Mardee Lynwood SQUIBB, MD;  Location: [facility];  Service: Orthopedics;  Laterality: Right;   KNEE ARTHROSCOPY Right 07/02/2018   Procedure: ARTHROSCOPY KNEE, Medial and Lateral  Chondroplasty;  Surgeon: Mardee Lynwood SQUIBB, MD;  Location: [facility];  Service: Orthopedics;  Laterality: Right;   LUMBAR LAMINECTOMY/DECOMPRESSION MICRODISCECTOMY N/A 06/19/2022   Procedure: EXPLORE LUMBAR WOUND FOR EVACUATION OF HEMATOMA;  Surgeon: Mavis Purchase, MD;  Location: [facility];  Service: Neurosurgery;  Laterality: N/A;   NECK SURGERY     disk implant   REVERSE SHOULDER ARTHROPLASTY Left 06/24/2019   Procedure: REVERSE SHOULDER ARTHROPLASTY;  Surgeon: Edie Norleen PARAS, MD;  Location: [facility];  Service: Orthopedics;  Laterality: Left;   SHOULDER SURGERY Right    spur removal   SPINAL CORD STIMULATOR REMOVAL N/A 04/09/2021   Procedure: REMOVAL SPINAL CORD STIMULATOR & PULSE GENERATOR;  Surgeon: Bluford Standing, MD;  Location: [facility];  Service: Neurosurgery;  Laterality: N/A;  2nd case   THORACIC LAMINECTOMY FOR SPINAL CORD STIMULATOR N/A 08/07/2020   Procedure: THORACIC SPINAL CORD STIMULATOR VIA LAMINECTOMY, PULSE GENERATOR;  Surgeon: Bluford Standing, MD;  Location: [facility];  Service: Neurosurgery;  Laterality: N/A;   TOTAL HIP ARTHROPLASTY Right    TUBAL LIGATION      Allergies: Allergies as of 07/28/2024 - Review Complete 07/28/2024  Allergen Reaction Noted   Cephalexin  Hives 02/12/2018   Ibuprofen Nausea And Vomiting 06/28/2015   Potassium chloride  Itching 10/19/2020   Shellfish allergy  Nausea And Vomiting 01/29/2017   Aspirin Nausea And Vomiting 06/28/2015    Medications: Outpatient Encounter Medications as of 07/28/2024  Medication Sig   acetaminophen  (TYLENOL ) 325 MG tablet Take 650 mg by mouth every 4 (four) hours as needed for mild pain, fever or headache.   albuterol  (VENTOLIN  HFA)  108 (90 Base) MCG/ACT inhaler Inhale 1 puff into the lungs every 4 (four) hours as needed for shortness of breath or wheezing.   amLODipine (NORVASC) 5 MG tablet Take 5 mg by mouth daily.   atorvastatin  (LIPITOR) 10 MG tablet Take 10 mg by mouth daily after supper.    budesonide-formoterol  (SYMBICORT) 160-4.5 MCG/ACT inhaler Inhale 2 puffs into the lungs 2 (two) times daily.   buprenorphine  (BUTRANS ) 10 MCG/HR PTWK Place 1 patch onto the skin once a week.   calcium  carbonate (OS-CAL) 600 MG TABS tablet Take 600 mg by mouth every other day.   celecoxib  (CELEBREX ) 200 MG capsule Take 200 mg by mouth 2 (two) times daily.   Cholecalciferol  (VITAMIN D3 SUPER STRENGTH) 50 MCG (2000 UT) TABS Take 3,000 Units by mouth daily.   ciclopirox  (LOPROX ) 0.77 % cream Apply to rash under breasts once to twice daily as needed.   ciclopirox  (LOPROX ) 0.77 % SUSP Apply to toenails nightly, remove once a week with alcohol .   clobetasol  (TEMOVATE ) 0.05 % external solution Apply 1-2 drops to affected area scalp as needed for itch. Avoid face,  groin, axilla.   clonazePAM  (KLONOPIN ) 0.5 MG tablet Take 0.5 mg by mouth at bedtime as needed for anxiety.   clotrimazole -betamethasone  (LOTRISONE) cream Apply 1 Application topically as needed.   diclofenac (VOLTAREN) 75 MG EC tablet Take 75 mg by mouth 2 (two) times daily.   diclofenac (VOLTAREN) 75 MG EC tablet Take 75 mg by mouth 2 (two) times daily.   DULoxetine  (CYMBALTA ) 30 MG capsule Take 30 mg by mouth at bedtime.   DULoxetine  (CYMBALTA ) 60 MG capsule Take 60 mg by mouth daily.   esomeprazole (NEXIUM) 40 MG capsule Take 40 mg by mouth 2 (two) times daily.    fluconazole  (DIFLUCAN ) 200 MG tablet Take 1 tablet (200 mg total) by mouth daily.   Fluocinolone  Acetonide Body 0.01 % OIL Apply to scalp twice daily as needed for itch.   fluticasone  (FLONASE ) 50 MCG/ACT nasal spray Place 2 sprays into both nostrils daily.   gabapentin  (NEURONTIN ) 600 MG tablet Take 300 mg by mouth  in the morning, at noon, and at bedtime.   HYDROcodone -acetaminophen  (NORCO/VICODIN) 5-325 MG tablet Take 1 tablet by mouth every 6 (six) hours as needed for moderate pain.   hydrocortisone  2.5 % cream Apply to aa's under the breast BID PRN.   ibandronate  (BONIVA ) 150 MG tablet Take 150 mg by mouth every 30 (thirty) days.   ketoconazole  (NIZORAL ) 2 % shampoo Massage into scalp and let sit 10-15 mins before rinsing. Use 1x/week.   levocetirizine (XYZAL) 5 MG tablet Take 5 mg by mouth every evening.   linaclotide  (LINZESS ) 290 MCG CAPS capsule Take 290 mcg by mouth daily.   losartan  (COZAAR ) 100 MG tablet Take 100 mg by mouth daily.   methimazole  (TAPAZOLE ) 5 MG tablet Take 5 mg by mouth daily.   methocarbamol  1000 MG TABS Take 1,000 mg by mouth 3 (three) times daily.   MOUNJARO 10 MG/0.5ML Pen Inject 10 mg into the skin once a week.   Multiple Vitamin (MULTIVITAMIN WITH MINERALS) TABS tablet Take 1 tablet by mouth daily.   Naloxone  HCl (NARCAN  NA) Place 4 mg into the nose 3 (three) times daily as needed.   pimecrolimus  (ELIDEL ) 1 % cream Apply to affected areas rash under breasts once to twice daily as needed.   potassium chloride  (KLOR-CON ) 10 MEQ tablet Take 10 mEq by mouth daily.   propranolol  (INDERAL ) 80 MG tablet Take 80 mg by mouth 2 (two) times daily.   sucralfate  (CARAFATE ) 1 g tablet Take 1 g by mouth 4 (four) times daily -  before meals and at bedtime.   terbinafine  (LAMISIL ) 250 MG tablet Take 1 tablet (250 mg total) by mouth daily. Take with food.   torsemide  (DEMADEX ) 10 MG tablet Take 10 mg by mouth daily.   traZODone  (DESYREL ) 150 MG tablet Take 75 mg by mouth at bedtime as needed for sleep.    No facility-administered encounter medications on file as of 07/28/2024.    Social History: Social History   Tobacco Use   Smoking status: Former    Current packs/day: 0.00    Types: Cigarettes    Quit date: 1994    Years since quitting: 31.9   Smokeless tobacco: Never  Vaping  Use   Vaping status: Never Used  Substance Use Topics   Alcohol  use: No    Alcohol /week: 0.0 standard drinks of alcohol    Drug use: Not Currently    Types: Crack cocaine, Marijuana    Comment: 1991    Family Medical History: Family History  Problem Relation Age of Onset   Cancer Mother    Heart disease Father    Breast cancer Neg Hx     Physical Examination: @VITALWITHPAIN @  General: Patient is well developed, well nourished, calm, collected, and in no apparent distress. Attention to examination is appropriate.  Psychiatric: Patient is non-anxious.  Head:  Pupils equal, round, and reactive to light.  ENT:  Oral mucosa appears well hydrated.  Neck:   Supple.  Full range of motion.  Respiratory: Patient is breathing without any difficulty.  Extremities: No edema.  Vascular: Palpable dorsal pedal pulses.  Skin:   On exposed skin, there are no abnormal skin lesions.  NEUROLOGICAL:     Awake, alert, oriented to person, place, and time.  Speech is clear and fluent. Fund of knowledge is appropriate.   Cranial Nerves: Pupils equal round and reactive to light.  Facial tone is symmetric.  Facial sensation is symmetric.  ROM of spine: Patient has tenderness palpation of her lumbar spine.   Strength: Giveaway weakness throughout bilateral lower extremities but at least 4-4+.  Positive straight leg raise.  Unable to obtain patellar reflex.  Absent Achilles reflex bilaterally.  Walks with assistive device.  Medical Decision Making  Imaging: EXAM: CT OF THE LUMBAR SPINE WITHOUT CONTRAST 01/25/2024 03:07:01 PM   TECHNIQUE: CT of the lumbar spine was performed without the administration of intravenous contrast. Multiplanar reformatted images are provided for review. Automated exposure control, iterative reconstruction, and/or weight based adjustment of the mA/kV was utilized to reduce the radiation dose to as low as reasonably achievable.   COMPARISON: Lumbar spine  CT myelogram dated 02/04/2023.   CLINICAL HISTORY: Lumbar spine pain, fall. Arrived via EMS from home due to a fall. Pt fell back and landed on her back. Pt had no LOC or blood thinners.   FINDINGS:   BONES AND ALIGNMENT: No acute fracture of the lumbar spine. Unchanged postoperative appearance from prior L3-L4 laminectomy, interbody and posterior spinal fusion. Hardware is intact. No associated lucency. Partially imaged right total hip arthroplasty.   DEGENERATIVE CHANGES: Moderate degenerative changes of the bilateral sacroiliac joints. Disc bulge and facet arthropathy contribute to mild spinal canal stenosis and moderate-to-severe bilateral neural foraminal narrowing at L4-5.   SOFT TISSUES: Atherosclerotic calcifications of the abdominal aorta and its branches.   IMPRESSION: 1. No acute fracture of the lumbar spine. 2. Unchanged postoperative appearance from prior L3-L4 laminectomy, interbody, and posterior spinal fusion. Hardware is intact. No associated lucency. 3. Disc bulge and facet arthropathy at L4-5 contribute to mild spinal canal stenosis and moderate-to-severe bilateral neural foraminal narrowing.  I have personally reviewed the images and agree with the above interpretation.  Assessment and Plan: Ms. Nevers is a pleasant 68 y.o. female with history of previous lumbar fusion as well as spinal cord stimulator placement and subsequent removal.  Comes today for increased back pain especially in the setting of recent fall approximately 3 months ago.  Pain extends from her low back into back and sides of her legs down to the tops of her feet.  Patient does have some pain limited weakness on examination.  Positive straight leg raise.  Concern for potential worsening stenosis since fall in August.  Plan includes the following moving forward:  -Physical therapy referral placed - Lumbar spine x-ray today to include flexion-extension - MRI of lumbar spine to evaluate for  hardware failure and worsening stenosis.  Will review results once complete - See back in approximately 8 weeks  Thank you for involving me in the care of this patient.    Lyle Decamp, PA-C Dept. of Neurosurgery

## 2024-07-28 ENCOUNTER — Encounter: Payer: Self-pay | Admitting: Physician Assistant

## 2024-07-28 ENCOUNTER — Ambulatory Visit

## 2024-07-28 ENCOUNTER — Ambulatory Visit: Admitting: Physician Assistant

## 2024-07-28 VITALS — BP 136/84 | Wt 239.4 lb

## 2024-07-28 DIAGNOSIS — M47816 Spondylosis without myelopathy or radiculopathy, lumbar region: Secondary | ICD-10-CM | POA: Diagnosis not present

## 2024-07-28 DIAGNOSIS — M51369 Other intervertebral disc degeneration, lumbar region without mention of lumbar back pain or lower extremity pain: Secondary | ICD-10-CM | POA: Diagnosis not present

## 2024-07-28 DIAGNOSIS — Z981 Arthrodesis status: Secondary | ICD-10-CM

## 2024-07-28 DIAGNOSIS — M5416 Radiculopathy, lumbar region: Secondary | ICD-10-CM | POA: Diagnosis not present

## 2024-07-28 DIAGNOSIS — W19XXXA Unspecified fall, initial encounter: Secondary | ICD-10-CM | POA: Diagnosis not present

## 2024-07-28 DIAGNOSIS — Z049 Encounter for examination and observation for unspecified reason: Secondary | ICD-10-CM

## 2024-07-29 ENCOUNTER — Encounter: Payer: Self-pay | Admitting: Physician Assistant

## 2024-07-29 ENCOUNTER — Inpatient Hospital Stay (HOSPITAL_COMMUNITY): Admission: RE | Admit: 2024-07-29

## 2024-08-02 ENCOUNTER — Inpatient Hospital Stay (HOSPITAL_COMMUNITY): Admission: RE | Admit: 2024-08-02 | Discharge: 2024-08-02 | Attending: Rheumatology | Admitting: Rheumatology

## 2024-08-02 VITALS — BP 138/72 | HR 74 | Temp 98.5°F | Resp 16

## 2024-08-02 DIAGNOSIS — L409 Psoriasis, unspecified: Secondary | ICD-10-CM

## 2024-08-02 MED ORDER — ACETAMINOPHEN 325 MG PO TABS
ORAL_TABLET | ORAL | Status: AC
  Filled 2024-08-02: qty 2

## 2024-08-02 MED ORDER — ACETAMINOPHEN 325 MG PO TABS
650.0000 mg | ORAL_TABLET | Freq: Once | ORAL | Status: AC
  Administered 2024-08-02: 650 mg via ORAL

## 2024-08-02 MED ORDER — SODIUM CHLORIDE 0.9 % IV SOLN
625.0000 mg | Freq: Once | INTRAVENOUS | Status: AC
  Administered 2024-08-02: 625 mg via INTRAVENOUS
  Filled 2024-08-02: qty 25

## 2024-08-04 ENCOUNTER — Ambulatory Visit: Payer: Self-pay | Admitting: Physician Assistant

## 2024-08-04 ENCOUNTER — Encounter: Payer: Self-pay | Admitting: Dermatology

## 2024-08-04 ENCOUNTER — Ambulatory Visit: Admitting: Dermatology

## 2024-08-04 DIAGNOSIS — D1722 Benign lipomatous neoplasm of skin and subcutaneous tissue of left arm: Secondary | ICD-10-CM | POA: Diagnosis not present

## 2024-08-04 DIAGNOSIS — D492 Neoplasm of unspecified behavior of bone, soft tissue, and skin: Secondary | ICD-10-CM | POA: Diagnosis not present

## 2024-08-04 DIAGNOSIS — L729 Follicular cyst of the skin and subcutaneous tissue, unspecified: Secondary | ICD-10-CM | POA: Diagnosis not present

## 2024-08-04 DIAGNOSIS — L989 Disorder of the skin and subcutaneous tissue, unspecified: Secondary | ICD-10-CM

## 2024-08-04 DIAGNOSIS — B351 Tinea unguium: Secondary | ICD-10-CM | POA: Diagnosis not present

## 2024-08-04 DIAGNOSIS — L219 Seborrheic dermatitis, unspecified: Secondary | ICD-10-CM

## 2024-08-04 MED ORDER — CLOBETASOL PROPIONATE 0.05 % EX SOLN: CUTANEOUS | 2 refills | Status: AC

## 2024-08-04 MED ORDER — FLUCONAZOLE 200 MG PO TABS: 200.0000 mg | ORAL_TABLET | ORAL | 4 refills | Status: AC

## 2024-08-04 MED ORDER — MUPIROCIN 2 % EX OINT: TOPICAL_OINTMENT | CUTANEOUS | 0 refills | Status: AC

## 2024-08-04 NOTE — Patient Instructions (Signed)
 Wound Care Instructions for After Surgery  On the day following your surgery, you should begin doing daily dressing changes until your sutures are removed: Remove the bandage. Cleanse the wound gently with soap and water.  Make sure you then dry the skin surrounding the wound completely or the tape will not stick to the skin. Do not use cotton balls on the wound. After the wound is clean and dry, apply the ointment (either prescription antibiotic prescribed by your doctor or plain Vaseline if nothing was prescribed) gently with a Q-tip. If you are using a bandaid to cover: Apply a bandaid large enough to cover the entire wound. If you do not have a bandaid large enough to cover the wound OR if you are sensitive to bandaid adhesive: Cut a non-stick pad (such as Telfa) to fit the size of the wound.  Cover the wound with the non-stick pad. If the wound is draining, you may want to add a small amount of gauze on top of the non-stick pad for a little added compression to the area. Use tape to seal the area completely.  For the next 1-2 weeks: Be sure to keep the wound moist with ointment 24/7 to ensure best healing. If you are unable to cover the wound with a bandage to hold the ointment in place, you may need to reapply the ointment several times a day. Do not bend over or lift heavy items to reduce the chance of elevated blood pressure to the wound. Do not participate in particularly strenuous activities.  Below is a list of dressing supplies you might need.  Cotton-tipped applicators - Q-tips Gauze pads (2x2 and/or 4x4) - All-Purpose Sponges New and clean tube of petroleum jelly (Vaseline) OR prescription antibiotic ointment if prescribed Either a bandaid large enough to cover the entire wound OR non-stick dressing material (Telfa) and Tape (Paper or Hypafix)  FOR ADULT SURGERY PATIENTS: If you need something for pain relief, you may take 1 extra strength Tylenol  (acetaminophen ) and 2  ibuprofen (200 mg) together every 4 hours as needed. (Do not take these medications if you are allergic to them or if you know you cannot take them for any other reason). Typically you may only need pain medication for 1-3 days.   Comments on the Post-Operative Period Slight swelling and redness often appear around the wound. This is normal and will disappear within several days following the surgery. The healing wound will drain a brownish-red-yellow discharge during healing. This is a normal phase of wound healing. As the wound begins to heal, the drainage may increase in amount. Again, this drainage is normal. Notify us  if the drainage becomes persistently bloody, excessively swollen, or intensely painful or develops a foul odor or red streaks.  The healing wound will also typically be itchy. This is normal. If you have severe or persistent pain, Notify us  if the discomfort is severe or persistent. Avoid alcoholic beverages when taking pain medicine.  In Case of Wound Hemorrhage A wound hemorrhage is when the bandage suddenly becomes soaked with bright red blood and flows profusely. If this happens, sit down or lie down with your head elevated. If the wound has a dressing on it, do not remove the dressing. Apply pressure to the existing gauze. If the wound is not covered, use a gauze pad to apply pressure and continue applying the pressure for 20 minutes without peeking. DO NOT COVER THE WOUND WITH A LARGE TOWEL OR WASH CLOTH. Release your hand from the  wound site but do not remove the dressing. If the bleeding has stopped, gently clean around the wound. Leave the dressing in place for 24 hours if possible. This wait time allows the blood vessels to close off so that you do not spark a new round of bleeding by disrupting the newly clotted blood vessels with an immediate dressing change. If the bleeding does not subside, continue to hold pressure for 40 minutes. If bleeding continues, page your  physician, contact an After Hours clinic or go to the Emergency Room.    Due to recent changes in healthcare laws, you may see results of your pathology and/or laboratory studies on MyChart before the doctors have had a chance to review them. We understand that in some cases there may be results that are confusing or concerning to you. Please understand that not all results are received at the same time and often the doctors may need to interpret multiple results in order to provide you with the best plan of care or course of treatment. Therefore, we ask that you please give us  2 business days to thoroughly review all your results before contacting the office for clarification. Should we see a critical lab result, you will be contacted sooner.   If You Need Anything After Your Visit  If you have any questions or concerns for your doctor, please call our main line at [phone] and press option 4 to reach your doctor's medical assistant. If no one answers, please leave a voicemail as directed and we will return your call as soon as possible. Messages left after 4 pm will be answered the following business day.   You may also send us  a message via MyChart. We typically respond to MyChart messages within 1-2 business days.  For prescription refills, please ask your pharmacy to contact our office. Our fax number is [phone].  If you have an urgent issue when the clinic is closed that cannot wait until the next business day, you can page your doctor at the number below.    Please note that while we do our best to be available for urgent issues outside of office hours, we are not available 24/7.   If you have an urgent issue and are unable to reach us , you may choose to seek medical care at your doctor's office, retail clinic, urgent care center, or emergency room.  If you have a medical emergency, please immediately call 911 or go to the emergency department.  Pager Numbers  - Dr. Hester:  [phone]  - Dr. Jackquline: [phone]  - Dr. Claudene: [phone]   - Dr. Raymund: [phone]  In the event of inclement weather, please call our main line at [phone] for an update on the status of any delays or closures.  Dermatology Medication Tips: Please keep the boxes that topical medications come in in order to help keep track of the instructions about where and how to use these. Pharmacies typically print the medication instructions only on the boxes and not directly on the medication tubes.   If your medication is too expensive, please contact our office at [phone] option 4 or send us  a message through MyChart.   We are unable to tell what your co-pay for medications will be in advance as this is different depending on your insurance coverage. However, we may be able to find a substitute medication at lower cost or fill out paperwork to get insurance to cover a needed medication.   If a prior authorization  is required to get your medication covered by your insurance company, please allow us  1-2 business days to complete this process.  Drug prices often vary depending on where the prescription is filled and some pharmacies may offer cheaper prices.  The website www.goodrx.com contains coupons for medications through different pharmacies. The prices here do not account for what the cost may be with help from insurance (it may be cheaper with your insurance), but the website can give you the price if you did not use any insurance.  - You can print the associated coupon and take it with your prescription to the pharmacy.  - You may also stop by our office during regular business hours and pick up a GoodRx coupon card.  - If you need your prescription sent electronically to a different pharmacy, notify our office through [facility] or by phone at [phone] option 4.     Si Usted Necesita Algo Despus de Su Visita  Tambin puede enviarnos un mensaje a travs  de Clinical cytogeneticist. Por lo general respondemos a los mensajes de MyChart en el transcurso de 1 a 2 das hbiles.  Para renovar recetas, por favor pida a su farmacia que se ponga en contacto con nuestra oficina. Randi lakes de fax es [place] [phone].  Si tiene un asunto urgente cuando la clnica est cerrada y que no puede esperar hasta el siguiente da hbil, puede llamar/localizar a su doctor(a) al nmero que aparece a continuacin.   Por favor, tenga en cuenta que aunque hacemos todo lo posible para estar disponibles para asuntos urgentes fuera del horario de [place], no estamos disponibles las 24 horas del da, los 7 [address]  [address] de la [place].   Si tiene un problema urgente y no puede comunicarse con nosotros, puede optar por buscar atencin mdica  en el consultorio de su doctor(a), en una clnica privada, en un centro de atencin urgente o en una sala de emergencias.  Si tiene Engineer, drilling, por favor llame inmediatamente al 911 o vaya a la sala de emergencias.  Nmeros de bper  - Dr. Hester: [phone]  - Dra. Jackquline: [phone]  - Dr. Claudene: [phone]  - Dra. Kitts: [phone]  En caso de inclemencias del [place], por favor llame a nuestra lnea principal al [phone] para una actualizacin sobre el estado de cualquier retraso o cierre.  Consejos para la medicacin en dermatologa: Por favor, guarde las cajas en las que vienen los medicamentos de uso tpico para ayudarle a seguir las instrucciones sobre dnde y cmo usarlos. Las farmacias generalmente imprimen las instrucciones del medicamento slo en las cajas y no directamente en los tubos del [place].   Si su medicamento es muy caro, por favor, pngase en contacto con landry rieger llamando al [phone] y presione la opcin 4 o envenos un mensaje a travs de Clinical cytogeneticist.   No podemos decirle cul ser su copago por los medicamentos por adelantado ya que esto es diferente dependiendo de la cobertura de su seguro.  Sin embargo, es posible que podamos encontrar un medicamento sustituto a Audiological scientist un formulario para que el seguro cubra el medicamento que se considera necesario.   Si se requiere una autorizacin previa para que su compaa de seguros malta su medicamento, por favor permtanos de 1 a 2 das hbiles para completar este proceso.  Los precios de los medicamentos varan con frecuencia dependiendo del Environmental consultant de dnde se surte la receta y alguna farmacias pueden ofrecer precios ms baratos.  El sitio web  www.goodrx.com tiene cupones para medicamentos de Health and safety inspector. Los precios aqu no tienen en cuenta lo que podra costar con la ayuda del seguro (puede ser ms barato con su seguro), pero el sitio web puede darle el precio si no utiliz Tourist information centre manager.  - Puede imprimir el cupn correspondiente y llevarlo con su receta a la farmacia.  - Tambin puede pasar por nuestra oficina durante el horario de atencin regular y Education officer, museum una tarjeta de cupones de GoodRx.  - Si necesita que su receta se enve electrnicamente a una farmacia diferente, informe a nuestra oficina a travs de MyChart de [place] o por telfono llamando al [phone] y presione la opcin 4.

## 2024-08-04 NOTE — Progress Notes (Signed)
 Follow-Up Visit   Subjective  Christine Higgins is a 68 y.o. female who presents for the following: Lipoma vs cyst L antecubitum, pt needs refills of Fluconazole  200mg  for onychomycosis of the toenails, pt needs refills of Clobetasol  sol for seb derm vs psoriasis of scalp   The following portions of the chart were reviewed this encounter and updated as appropriate: medications, allergies, medical history  Review of Systems:  No other skin or systemic complaints except as noted in HPI or Assessment and Plan.  Objective  Well appearing patient in no apparent distress; mood and affect are within normal limits.   A focused examination was performed of the following areas: L arm  Relevant exam findings are noted in the Assessment and Plan.  L antecubitum 1.2cm firm sub q nodule  Assessment & Plan   SEBORRHEIC DERMATITIS VS MILD PSORIASIS scalp Exam: scalp not examined today   Chronic and persistent condition with duration or expected duration over one year. Condition is improving with treatment but not currently at goal.  Pt requests rfs.    Seborrheic Dermatitis is a chronic persistent rash characterized by pinkness and scaling most commonly of the mid face but also can occur on the scalp (dandruff), ears; mid chest, mid back and groin.  It tends to be exacerbated by stress and cooler weather.  People who have neurologic disease may experience new onset or exacerbation of existing seborrheic dermatitis.  The condition is not curable but treatable and can be controlled.   Treatment Plan: Cont Ketoconazole  2% shampoo wash scalp 1x/wk, let sit 10 minutes before rinsing. Cont Clobetasol  solution 1-2 drops to AA scalp qd/bid prn flares, avoid f/g/a.   Topical steroids (such as triamcinolone , fluocinolone , fluocinonide, mometasone , clobetasol , halobetasol, betamethasone , hydrocortisone ) can cause thinning and lightening of the skin if they are used for too long in the same area. Your  physician has selected the right strength medicine for your problem and area affected on the body. Please use your medication only as directed by your physician to prevent side effects.    ONYCHOMYCOSIS toenails Exam: nails not examined today, will recheck at 1 wk f/u.    Chronic and persistent condition with duration or expected duration over one year. Condition is bothersome/symptomatic for patient. Currently flared.  Has taken terbinafine  in the past with some improvement but not clearance. Pt requests rfs.   Treatment Plan: Cont Fluconazole  200mg  1 po q wk, 4 rfs Cont Ciclopirox  lacquer qhs to toenails, remove every week.   Side effects of fluconazole  (diflucan ) include nausea, diarrhea, headache, dizziness, taste changes, rare risk of irritation of the liver, allergy , or decreased blood counts (which could show up as infection or tiredness).   NEOPLASM OF SKIN L antecubitum Skin excision  Lesion length (cm):  1.2 Lesion width (cm):  1.2 Margin per side (cm):  0.1 Total excision diameter (cm):  1.4 Informed consent: discussed and consent obtained   Timeout: patient name, date of birth, surgical site, and procedure verified   Procedure prep:  Patient was prepped and draped in usual sterile fashion Prep type:  Povidone-iodine  Anesthesia: the lesion was anesthetized in a standard fashion   Anesthetic:  1% lidocaine  w/ epinephrine  1-100,000 buffered w/ 8.4% NaHCO3 (9cc lido with epi) Instrument used comment:  #15c blade Hemostasis achieved with: pressure   Outcome: patient tolerated procedure well with no complications    Skin repair Complexity:  Intermediate Final length (cm):  1.5 Informed consent: discussed and consent obtained   Reason  for type of repair: reduce tension to allow closure, reduce the risk of dehiscence, infection, and necrosis, reduce subcutaneous dead space and avoid a hematoma, preserve normal anatomical and functional relationships and enhance both  functionality and cosmetic results   Undermining: edges undermined   Subcutaneous layers (deep stitches):  Suture size:  4-0 Suture type: Vicryl (polyglactin 910)   Subcutaneous suture technique: inverted dermal. Fine/surface layer approximation (top stitches):  Suture size:  4-0 Suture type: nylon   Stitches: simple interrupted   Suture removal (days):  7 Hemostasis achieved with: suture Outcome: patient tolerated procedure well with no complications   Post-procedure details: sterile dressing applied and wound care instructions given   Dressing type: pressure dressing (Mupirocin ointment)    Specimen 1 - Surgical pathology Differential Diagnosis: Lipoma vs Cyst  Check Margins: No 1.2cm firm sub q nodule Start mupirocin 2% ointment once daily with bandage changes.  ONYCHOMYCOSIS   Related Medications terbinafine  (LAMISIL ) 250 MG tablet Take 1 tablet (250 mg total) by mouth daily. Take with food. ciclopirox  (LOPROX ) 0.77 % SUSP Apply to toenails nightly, remove once a week with alcohol . fluconazole  (DIFLUCAN ) 200 MG tablet Take 1 tablet (200 mg total) by mouth once a week. SEBORRHEIC DERMATITIS   Related Medications Fluocinolone  Acetonide Body 0.01 % OIL Apply to scalp twice daily as needed for itch. ketoconazole  (NIZORAL ) 2 % shampoo Massage into scalp and let sit 10-15 mins before rinsing. Use 1x/week. clobetasol  (TEMOVATE ) 0.05 % external solution Apply 1-2 drops to affected area scalp as needed for itch. Avoid face, groin, axilla.  Return in about 1 week (around 08/11/2024) for suture removal, recheck toenails.  I, Grayce Saunas, RMA, am acting as scribe for Rexene Rattler, MD .   Documentation: I have reviewed the above documentation for accuracy and completeness, and I agree with the above.  Rexene Rattler, MD

## 2024-08-05 ENCOUNTER — Other Ambulatory Visit: Payer: Self-pay | Admitting: Medical Genetics

## 2024-08-05 ENCOUNTER — Telehealth: Payer: Self-pay

## 2024-08-05 NOTE — Telephone Encounter (Signed)
 Left pt msg to call if any problems after yesterday's surgery.Christine Higgins

## 2024-08-06 LAB — SURGICAL PATHOLOGY

## 2024-08-09 ENCOUNTER — Ambulatory Visit: Payer: Self-pay | Admitting: Dermatology

## 2024-08-12 ENCOUNTER — Other Ambulatory Visit: Payer: Self-pay | Admitting: Physician Assistant

## 2024-08-12 ENCOUNTER — Other Ambulatory Visit

## 2024-08-12 ENCOUNTER — Encounter

## 2024-08-12 ENCOUNTER — Ambulatory Visit

## 2024-08-12 DIAGNOSIS — Z981 Arthrodesis status: Secondary | ICD-10-CM

## 2024-08-12 DIAGNOSIS — Z48817 Encounter for surgical aftercare following surgery on the skin and subcutaneous tissue: Secondary | ICD-10-CM

## 2024-08-12 DIAGNOSIS — M5416 Radiculopathy, lumbar region: Secondary | ICD-10-CM

## 2024-08-12 NOTE — Patient Instructions (Signed)

## 2024-08-12 NOTE — Progress Notes (Signed)
° °  Follow-Up Visit   Subjective  NAZ DENUNZIO is a 68 y.o. female who presents for the following: Suture removal   Pathology showed NODULAR MATURE ADIPOSE TISSUE CONTAINING A CYST WITH FLATTENED EPITHELIAL   The following portions of the chart were reviewed this encounter and updated as appropriate: medications, allergies, medical history  Review of Systems:  No other skin or systemic complaints except as noted in HPI or Assessment and Plan.  Objective  Well appearing patient in no apparent distress; mood and affect are within normal limits.  Areas Examined: L antecubitum  Relevant physical exam findings are noted in the Assessment and Plan.    Assessment & Plan    Encounter for Removal of Sutures - Incision site is clean, dry and intact. - Wound cleansed, sutures removed, wound cleansed and steri strips applied.  - Discussed pathology results showing NODULAR MATURE ADIPOSE TISSUE CONTAINING A CYST WITH FLATTENED EPITHELIAL  - Patient advised to keep steri-strips dry until they fall off. - Scars remodel for a full year. - Once steri-strips fall off, patient can apply over-the-counter silicone scar cream once to twice a day to help with scar remodeling if desired. - Patient advised to call with any concerns or if they notice any new or changing lesions.  Return if symptoms worsen or fail to improve.  Fay Kirks

## 2024-08-13 ENCOUNTER — Other Ambulatory Visit: Payer: Self-pay | Admitting: Physician Assistant

## 2024-08-13 ENCOUNTER — Telehealth: Payer: Self-pay | Admitting: Family Medicine

## 2024-08-13 MED ORDER — OXYCODONE HCL 5 MG PO TABS: 5.0000 mg | ORAL_TABLET | ORAL | 0 refills | Status: AC | PRN

## 2024-08-13 NOTE — Telephone Encounter (Signed)
 Patient notified and will take it before the appointment. I informed her that she will need a driver.

## 2024-08-13 NOTE — Telephone Encounter (Signed)
 Patient called after hours line yesterday states she is having low back pain into her bottom and legs and is want a RX for it.   I spoke to patient and she states she tried to have the MRI yesterday but laying on the table was pressing on the right buttocks and she was unable to hold still. They had to reschedule her to have the MRI another time. She is asking for some pain medication to take for a few days so she can get her pain under control to have the MRI. It is scheduled on 09/04/2023.  She has been taking Tylenol  2 650mg  3 times a day. She is also using the Butrans  patch weekly. They told her she could not where the patch while she is in the machine.

## 2024-08-17 ENCOUNTER — Encounter (HOSPITAL_COMMUNITY)

## 2024-08-27 ENCOUNTER — Encounter (HOSPITAL_COMMUNITY)

## 2024-08-30 ENCOUNTER — Inpatient Hospital Stay (HOSPITAL_COMMUNITY): Admission: RE | Admit: 2024-08-30 | Source: Ambulatory Visit

## 2024-08-31 ENCOUNTER — Ambulatory Visit: Admitting: Dermatology

## 2024-09-03 ENCOUNTER — Other Ambulatory Visit

## 2024-09-07 ENCOUNTER — Ambulatory Visit: Admitting: Dermatology

## 2024-09-08 ENCOUNTER — Ambulatory Visit (HOSPITAL_COMMUNITY)
Admission: RE | Admit: 2024-09-08 | Discharge: 2024-09-08 | Disposition: A | Discharge status: Home / Self Care | Source: Ambulatory Visit | Attending: Rheumatology | Admitting: Rheumatology

## 2024-09-08 VITALS — BP 130/76 | HR 86 | Temp 98.3°F | Resp 16 | Ht 71.0 in | Wt 230.0 lb

## 2024-09-08 DIAGNOSIS — L409 Psoriasis, unspecified: Secondary | ICD-10-CM | POA: Diagnosis present

## 2024-09-08 MED ORDER — ACETAMINOPHEN 325 MG PO TABS
650.0000 mg | ORAL_TABLET | Freq: Once | ORAL | Status: AC
  Administered 2024-09-08: 650 mg via ORAL

## 2024-09-08 MED ORDER — SODIUM CHLORIDE 0.9 % IV SOLN
1.7500 mg/kg | Freq: Once | INTRAVENOUS | Status: AC
  Administered 2024-09-08: 182.5 mg via INTRAVENOUS
  Filled 2024-09-08: qty 7.3

## 2024-09-08 MED ORDER — ACETAMINOPHEN 325 MG PO TABS
ORAL_TABLET | ORAL | Status: AC
  Filled 2024-09-08: qty 2

## 2024-09-16 ENCOUNTER — Encounter: Admitting: Nurse Practitioner

## 2024-09-20 ENCOUNTER — Encounter: Admitting: Nurse Practitioner

## 2024-09-20 ENCOUNTER — Other Ambulatory Visit

## 2024-09-23 ENCOUNTER — Other Ambulatory Visit: Payer: Self-pay

## 2024-09-23 ENCOUNTER — Encounter: Payer: Self-pay | Admitting: Neurology

## 2024-09-23 DIAGNOSIS — R202 Paresthesia of skin: Secondary | ICD-10-CM

## 2024-09-24 NOTE — Progress Notes (Unsigned)
 Christine Higgins                                          MRN: [id number]   09/24/2024   The [facility] Quality Team Specialist reviewed this patient medical record for the purposes of chart review for care gap closure. The following were reviewed: {CHL AMB [facility]I QUALITY SPECIALIST REASON FOR REVIEW:[id number]}.    [facility]I Quality Team

## 2024-09-27 ENCOUNTER — Encounter (HOSPITAL_COMMUNITY)

## 2024-10-05 ENCOUNTER — Encounter: Admitting: Nurse Practitioner

## 2024-10-06 ENCOUNTER — Encounter (HOSPITAL_COMMUNITY)

## 2024-10-14 ENCOUNTER — Encounter: Payer: Self-pay | Admitting: Neurology

## 2024-10-29 ENCOUNTER — Encounter: Payer: Self-pay | Admitting: Neurology

## 2024-11-03 ENCOUNTER — Encounter (HOSPITAL_COMMUNITY)

## 2024-11-15 ENCOUNTER — Ambulatory Visit: Admitting: Dermatology
